# Patient Record
Sex: Female | Born: 1955 | Race: White | Hispanic: No | Marital: Single | Smoking: Never smoker
Health system: Southern US, Community
[De-identification: ages and names within clinical notes are randomized; demographics above are authoritative.]

## PROBLEM LIST (undated history)

## (undated) DIAGNOSIS — Z970 Presence of artificial eye: Secondary | ICD-10-CM

## (undated) DIAGNOSIS — F419 Anxiety disorder, unspecified: Secondary | ICD-10-CM

## (undated) DIAGNOSIS — I7300 Raynaud's syndrome without gangrene: Secondary | ICD-10-CM

## (undated) DIAGNOSIS — J189 Pneumonia, unspecified organism: Secondary | ICD-10-CM

## (undated) DIAGNOSIS — D649 Anemia, unspecified: Secondary | ICD-10-CM

## (undated) DIAGNOSIS — M810 Age-related osteoporosis without current pathological fracture: Secondary | ICD-10-CM

## (undated) DIAGNOSIS — M1990 Unspecified osteoarthritis, unspecified site: Secondary | ICD-10-CM

## (undated) DIAGNOSIS — F32A Depression, unspecified: Secondary | ICD-10-CM

## (undated) DIAGNOSIS — M67431 Ganglion, right wrist: Secondary | ICD-10-CM

## (undated) DIAGNOSIS — K759 Inflammatory liver disease, unspecified: Secondary | ICD-10-CM

## (undated) DIAGNOSIS — F909 Attention-deficit hyperactivity disorder, unspecified type: Secondary | ICD-10-CM

## (undated) DIAGNOSIS — K8020 Calculus of gallbladder without cholecystitis without obstruction: Secondary | ICD-10-CM

## (undated) DIAGNOSIS — R519 Headache, unspecified: Secondary | ICD-10-CM

## (undated) DIAGNOSIS — F329 Major depressive disorder, single episode, unspecified: Secondary | ICD-10-CM

## (undated) DIAGNOSIS — R51 Headache: Secondary | ICD-10-CM

## (undated) DIAGNOSIS — I10 Essential (primary) hypertension: Secondary | ICD-10-CM

## (undated) HISTORY — DX: Age-related osteoporosis without current pathological fracture: M81.0

## (undated) HISTORY — PX: ABDOMINAL HYSTERECTOMY: SHX81

## (undated) HISTORY — DX: Major depressive disorder, single episode, unspecified: F32.9

## (undated) HISTORY — PX: TONSILLECTOMY: SUR1361

## (undated) HISTORY — DX: Depression, unspecified: F32.A

---
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
## 1978-01-26 HISTORY — PX: RHINOPLASTY: SUR1284

## 1997-09-19 ENCOUNTER — Emergency Department (HOSPITAL_COMMUNITY): Admission: EM | Admit: 1997-09-19 | Discharge: 1997-09-19 | Payer: Self-pay | Admitting: Family Medicine

## 2002-02-06 ENCOUNTER — Encounter: Payer: Self-pay | Admitting: Internal Medicine

## 2002-02-06 ENCOUNTER — Encounter: Admission: RE | Admit: 2002-02-06 | Discharge: 2002-02-06 | Payer: Self-pay | Admitting: Internal Medicine

## 2002-09-07 ENCOUNTER — Encounter: Admission: RE | Admit: 2002-09-07 | Discharge: 2002-09-07 | Payer: Self-pay | Admitting: Internal Medicine

## 2002-09-07 ENCOUNTER — Encounter: Payer: Self-pay | Admitting: Internal Medicine

## 2003-03-27 ENCOUNTER — Encounter: Admission: RE | Admit: 2003-03-27 | Discharge: 2003-03-27 | Payer: Self-pay | Admitting: Internal Medicine

## 2003-04-02 ENCOUNTER — Encounter: Admission: RE | Admit: 2003-04-02 | Discharge: 2003-04-02 | Payer: Self-pay | Admitting: Internal Medicine

## 2003-12-04 ENCOUNTER — Ambulatory Visit: Payer: Self-pay | Admitting: Internal Medicine

## 2004-03-03 ENCOUNTER — Ambulatory Visit: Payer: Self-pay | Admitting: Gastroenterology

## 2004-05-08 ENCOUNTER — Ambulatory Visit: Payer: Self-pay | Admitting: Internal Medicine

## 2004-05-22 ENCOUNTER — Ambulatory Visit: Payer: Self-pay | Admitting: Internal Medicine

## 2004-06-12 ENCOUNTER — Ambulatory Visit: Payer: Self-pay | Admitting: Internal Medicine

## 2004-06-26 ENCOUNTER — Ambulatory Visit: Payer: Self-pay | Admitting: Internal Medicine

## 2004-07-10 ENCOUNTER — Ambulatory Visit: Payer: Self-pay | Admitting: Internal Medicine

## 2004-07-24 ENCOUNTER — Ambulatory Visit: Payer: Self-pay | Admitting: Internal Medicine

## 2004-08-04 ENCOUNTER — Ambulatory Visit (HOSPITAL_COMMUNITY): Admission: RE | Admit: 2004-08-04 | Discharge: 2004-08-04 | Payer: Self-pay | Admitting: Gastroenterology

## 2004-08-21 ENCOUNTER — Ambulatory Visit: Payer: Self-pay | Admitting: Gastroenterology

## 2004-09-18 ENCOUNTER — Ambulatory Visit: Payer: Self-pay | Admitting: Gastroenterology

## 2004-09-25 ENCOUNTER — Ambulatory Visit: Payer: Self-pay | Admitting: Gastroenterology

## 2004-10-09 ENCOUNTER — Ambulatory Visit: Payer: Self-pay | Admitting: Gastroenterology

## 2004-10-16 ENCOUNTER — Ambulatory Visit: Payer: Self-pay | Admitting: Gastroenterology

## 2004-10-23 ENCOUNTER — Ambulatory Visit: Payer: Self-pay | Admitting: Gastroenterology

## 2004-10-30 ENCOUNTER — Ambulatory Visit: Payer: Self-pay | Admitting: Gastroenterology

## 2004-11-06 ENCOUNTER — Ambulatory Visit: Payer: Self-pay | Admitting: Gastroenterology

## 2004-11-20 ENCOUNTER — Ambulatory Visit: Payer: Self-pay | Admitting: Gastroenterology

## 2004-12-04 ENCOUNTER — Ambulatory Visit: Payer: Self-pay | Admitting: Gastroenterology

## 2005-01-01 ENCOUNTER — Ambulatory Visit: Payer: Self-pay | Admitting: Gastroenterology

## 2005-01-08 ENCOUNTER — Ambulatory Visit: Payer: Self-pay | Admitting: Gastroenterology

## 2005-01-15 ENCOUNTER — Ambulatory Visit: Payer: Self-pay | Admitting: Gastroenterology

## 2005-01-26 DIAGNOSIS — A4902 Methicillin resistant Staphylococcus aureus infection, unspecified site: Secondary | ICD-10-CM

## 2005-01-26 DIAGNOSIS — J80 Acute respiratory distress syndrome: Secondary | ICD-10-CM

## 2005-01-26 HISTORY — PX: CORNEAL TRANSPLANT: SHX108

## 2005-01-26 HISTORY — DX: Acute respiratory distress syndrome: J80

## 2005-01-26 HISTORY — DX: Methicillin resistant Staphylococcus aureus infection, unspecified site: A49.02

## 2005-01-29 ENCOUNTER — Ambulatory Visit: Payer: Self-pay | Admitting: Gastroenterology

## 2005-02-05 ENCOUNTER — Ambulatory Visit: Payer: Self-pay | Admitting: Gastroenterology

## 2005-02-19 ENCOUNTER — Ambulatory Visit: Payer: Self-pay | Admitting: Gastroenterology

## 2005-03-12 ENCOUNTER — Ambulatory Visit: Payer: Self-pay | Admitting: Gastroenterology

## 2005-03-18 ENCOUNTER — Encounter: Admission: RE | Admit: 2005-03-18 | Discharge: 2005-03-18 | Payer: Self-pay | Admitting: Internal Medicine

## 2005-04-23 ENCOUNTER — Ambulatory Visit: Payer: Self-pay | Admitting: Gastroenterology

## 2005-07-16 ENCOUNTER — Ambulatory Visit: Payer: Self-pay | Admitting: Gastroenterology

## 2005-08-05 ENCOUNTER — Ambulatory Visit (HOSPITAL_COMMUNITY): Admission: RE | Admit: 2005-08-05 | Discharge: 2005-08-05 | Payer: Self-pay | Admitting: Gastroenterology

## 2005-09-03 ENCOUNTER — Encounter: Admission: RE | Admit: 2005-09-03 | Discharge: 2005-09-03 | Payer: Self-pay | Admitting: Internal Medicine

## 2007-01-27 DIAGNOSIS — J189 Pneumonia, unspecified organism: Secondary | ICD-10-CM

## 2007-01-27 HISTORY — DX: Pneumonia, unspecified organism: J18.9

## 2007-08-28 ENCOUNTER — Emergency Department (HOSPITAL_COMMUNITY): Admission: EM | Admit: 2007-08-28 | Discharge: 2007-08-28 | Payer: Self-pay | Admitting: Emergency Medicine

## 2008-04-09 ENCOUNTER — Ambulatory Visit (HOSPITAL_COMMUNITY): Admission: RE | Admit: 2008-04-09 | Discharge: 2008-04-09 | Payer: Self-pay | Admitting: Internal Medicine

## 2008-04-12 ENCOUNTER — Ambulatory Visit: Payer: Self-pay | Admitting: Family Medicine

## 2008-04-12 DIAGNOSIS — J309 Allergic rhinitis, unspecified: Secondary | ICD-10-CM | POA: Insufficient documentation

## 2008-04-12 DIAGNOSIS — L0201 Cutaneous abscess of face: Secondary | ICD-10-CM | POA: Insufficient documentation

## 2008-04-12 DIAGNOSIS — L03211 Cellulitis of face: Secondary | ICD-10-CM

## 2008-06-24 ENCOUNTER — Emergency Department (HOSPITAL_COMMUNITY): Admission: EM | Admit: 2008-06-24 | Discharge: 2008-06-24 | Payer: Self-pay | Admitting: Family Medicine

## 2008-06-24 ENCOUNTER — Ambulatory Visit: Payer: Self-pay | Admitting: Cardiology

## 2008-06-24 ENCOUNTER — Inpatient Hospital Stay (HOSPITAL_COMMUNITY): Admission: EM | Admit: 2008-06-24 | Discharge: 2008-07-07 | Payer: Self-pay | Admitting: Emergency Medicine

## 2008-06-24 ENCOUNTER — Ambulatory Visit: Payer: Self-pay | Admitting: Pulmonary Disease

## 2008-06-26 ENCOUNTER — Encounter (INDEPENDENT_AMBULATORY_CARE_PROVIDER_SITE_OTHER): Payer: Self-pay | Admitting: Pulmonary Disease

## 2008-07-05 ENCOUNTER — Ambulatory Visit: Payer: Self-pay | Admitting: Infectious Disease

## 2008-07-17 ENCOUNTER — Encounter: Admission: RE | Admit: 2008-07-17 | Discharge: 2008-07-17 | Payer: Self-pay | Admitting: Internal Medicine

## 2008-08-22 ENCOUNTER — Encounter: Payer: Self-pay | Admitting: Internal Medicine

## 2008-08-22 ENCOUNTER — Encounter: Admission: RE | Admit: 2008-08-22 | Discharge: 2008-08-22 | Payer: Self-pay | Admitting: Internal Medicine

## 2008-09-24 ENCOUNTER — Ambulatory Visit: Payer: Self-pay | Admitting: Internal Medicine

## 2008-09-27 DIAGNOSIS — R29818 Other symptoms and signs involving the nervous system: Secondary | ICD-10-CM | POA: Insufficient documentation

## 2008-10-01 DIAGNOSIS — J1520 Pneumonia due to staphylococcus, unspecified: Secondary | ICD-10-CM | POA: Insufficient documentation

## 2009-11-14 ENCOUNTER — Emergency Department (HOSPITAL_BASED_OUTPATIENT_CLINIC_OR_DEPARTMENT_OTHER): Admission: EM | Admit: 2009-11-14 | Discharge: 2009-11-14 | Payer: Self-pay | Admitting: Emergency Medicine

## 2009-11-14 ENCOUNTER — Ambulatory Visit: Payer: Self-pay | Admitting: Radiology

## 2009-11-16 ENCOUNTER — Emergency Department (HOSPITAL_BASED_OUTPATIENT_CLINIC_OR_DEPARTMENT_OTHER): Admission: EM | Admit: 2009-11-16 | Discharge: 2009-11-16 | Payer: Self-pay | Admitting: Emergency Medicine

## 2009-11-16 ENCOUNTER — Ambulatory Visit: Payer: Self-pay | Admitting: Diagnostic Radiology

## 2010-02-16 ENCOUNTER — Encounter: Payer: Self-pay | Admitting: Internal Medicine

## 2010-02-18 ENCOUNTER — Encounter
Admission: RE | Admit: 2010-02-18 | Discharge: 2010-02-25 | Payer: Self-pay | Source: Home / Self Care | Attending: Neurosurgery | Admitting: Neurosurgery

## 2010-02-19 ENCOUNTER — Ambulatory Visit (HOSPITAL_COMMUNITY)
Admission: RE | Admit: 2010-02-19 | Discharge: 2010-02-19 | Payer: Self-pay | Source: Home / Self Care | Attending: Neurosurgery | Admitting: Neurosurgery

## 2010-02-27 NOTE — Assessment & Plan Note (Signed)
Summary: Abcess/bump under/in nose x 1 wk rm 2    Vital Signs:  Patient Profile:   55 Years Old Female CC:      bump/abcess under nose or in nose x 1 wk Height:     62 inches Weight:      117 pounds O2 Sat:      100 % O2 treatment:    Room Air Temp:     97.1 degrees F oral Pulse rate:   57 / minute Pulse rhythm:   regular Resp:     16 per minute BP sitting:   114 / 65  (left arm) Cuff size:   regular  Pt. in pain?   yes    Location:   head    Intensity:   8  Vitals Entered By: Areta Haber CMA (April 12, 2008 9:58 AM)                   Current Allergies: No known allergies  History of Present Illness Chief Complaint: bump/abcess under nose or in nose x 1 wk History of Present Illness: SMALL ABSCESS LEFT NARE AND ONE STARTING IN RIGHT NARE. PAINFUL. NO PRIOR HX. NO DISCHARGE. ONSET 2-3-DAYS.  Current Problems: CELLULITIS AND ABSCESS OF FACE (ICD-682.0) ALLERGIC RHINITIS (ICD-477.9)   Current Meds ALLEGRA 60 MG TABS (FEXOFENADINE HCL) 1 tab by mouth once daily SINGULAIR 10 MG TABS (MONTELUKAST SODIUM) 1 tab by mouth once daily VALTREX 1 GM TABS (VALACYCLOVIR HCL) 1tab po  three times a day SEPTRA DS 800-160 MG TABS (SULFAMETHOXAZOLE-TRIMETHOPRIM) 1 TAB by mouth BID * BACTROBAN CREAM APPLY TO NARES TID   REVIEW OF SYSTEMS Constitutional Symptoms      Denies fever, chills, night sweats, weight loss, weight gain, and fatigue.  Eyes       Denies change in vision, eye pain, eye discharge, glasses, contact lenses, and eye surgery. Ear/Nose/Throat/Mouth       Denies hearing loss/aids, change in hearing, ear pain, ear discharge, dizziness, frequent runny nose, frequent nose bleeds, sinus problems, sore throat, hoarseness, and tooth pain or bleeding.      Comments: abcess.bump under nose Respiratory       Denies dry cough, productive cough, wheezing, shortness of breath, asthma, bronchitis, and emphysema/COPD.  Cardiovascular       Denies murmurs, chest pain, and  tires easily with exhertion.    Gastrointestinal       Denies stomach pain, nausea/vomiting, diarrhea, constipation, blood in bowel movements, and indigestion. Genitourniary       Denies painful urination, kidney stones, and loss of urinary control. Neurological       Denies paralysis, seizures, and fainting/blackouts. Musculoskeletal       Denies muscle pain, joint pain, joint stiffness, decreased range of motion, redness, swelling, muscle weakness, and gout.  Skin       Denies bruising, unusual mles/lumps or sores, and hair/skin or nail changes.  Psych       Denies mood changes, temper/anger issues, anxiety/stress, speech problems, depression, and sleep problems.  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 PAINFUL AND INDURATED, FOCAL SWELLING LATERAL WALL OF THE RIGHT NARE    Assessment New Problems: CELLULITIS AND ABSCESS OF FACE (ICD-682.0) ALLERGIC RHINITIS (ICD-477.9)   Plan New Medications/Changes: BACTROBAN CREAM APPLY TO NARES TID  #15 GM x 0, 04/12/2008, Nickoles Gregori DO SEPTRA DS 800-160 MG TABS (SULFAMETHOXAZOLE-TRIMETHOPRIM) 1 TAB by mouth BID  #20 x 0,  04/12/2008, Marvis Moeller DO  New Orders: New Patient Level II [99202]     Prescriptions: BACTROBAN CREAM APPLY TO NARES TID  #15 GM x 0   Entered and Authorized by:   Marvis Moeller DO   Signed by:   Marvis Moeller DO on 04/12/2008   Method used:   Print then Give to Patient   RxID:   [id number] SEPTRA DS 800-160 MG TABS (SULFAMETHOXAZOLE-TRIMETHOPRIM) 1 TAB by mouth BID  #20 x 0   Entered and Authorized by:   Marvis Moeller DO   Signed by:   Marvis Moeller DO on 04/12/2008   Method used:   Print then Give to Patient   RxID:   [id number]    Patient Instructions: 1)  APPLY HEAT THREE TIMES DAILY. FOLLOW UP WITH ENT IF IN NEED OF I & D. 2)  The medication list was reviewed and reconciled.  All changed / newly prescribed medications were explained.  A complete medication list was provided to the patient / caregiver.  ] ]

## 2010-02-27 NOTE — Letter (Signed)
Summary: Out of Work  [facility]  [address]   [place], [place] [address]   Phone: [phone]  Fax: [phone]    April 12, 2008   Employee:  JODENE POLYAK    To Whom It May Concern:   For Medical reasons, please excuse the above named employee from work for the following dates:  Start:   18 MARCH2010  End:   14 April 2008  If you need additional information, please feel free to contact our office.         Sincerely,    Marvis Moeller DO

## 2010-02-28 ENCOUNTER — Ambulatory Visit: Payer: Worker's Compensation | Attending: Neurosurgery | Admitting: Physical Therapy

## 2010-02-28 DIAGNOSIS — M2569 Stiffness of other specified joint, not elsewhere classified: Secondary | ICD-10-CM | POA: Insufficient documentation

## 2010-02-28 DIAGNOSIS — IMO0001 Reserved for inherently not codable concepts without codable children: Secondary | ICD-10-CM | POA: Insufficient documentation

## 2010-02-28 DIAGNOSIS — M545 Low back pain, unspecified: Secondary | ICD-10-CM | POA: Insufficient documentation

## 2010-02-28 DIAGNOSIS — R269 Unspecified abnormalities of gait and mobility: Secondary | ICD-10-CM | POA: Insufficient documentation

## 2010-03-03 ENCOUNTER — Ambulatory Visit: Payer: Worker's Compensation | Admitting: Physical Therapy

## 2010-03-06 ENCOUNTER — Ambulatory Visit: Payer: Worker's Compensation | Admitting: Physical Therapy

## 2010-04-07 ENCOUNTER — Other Ambulatory Visit (HOSPITAL_COMMUNITY): Payer: Self-pay | Admitting: Physical Medicine and Rehabilitation

## 2010-04-07 DIAGNOSIS — Q782 Osteopetrosis: Secondary | ICD-10-CM

## 2010-04-09 ENCOUNTER — Ambulatory Visit (HOSPITAL_COMMUNITY)
Admission: RE | Admit: 2010-04-09 | Discharge: 2010-04-09 | Disposition: A | Discharge status: Home / Self Care | Payer: PRIVATE HEALTH INSURANCE | Source: Ambulatory Visit | Attending: Physical Medicine and Rehabilitation | Admitting: Physical Medicine and Rehabilitation

## 2010-04-09 DIAGNOSIS — Q782 Osteopetrosis: Secondary | ICD-10-CM

## 2010-04-09 DIAGNOSIS — Z780 Asymptomatic menopausal state: Secondary | ICD-10-CM | POA: Insufficient documentation

## 2010-04-09 DIAGNOSIS — Z13820 Encounter for screening for osteoporosis: Secondary | ICD-10-CM | POA: Insufficient documentation

## 2010-05-05 LAB — BASIC METABOLIC PANEL
BUN: 11 mg/dL (ref 6–23)
BUN: 13 mg/dL (ref 6–23)
BUN: 14 mg/dL (ref 6–23)
BUN: 15 mg/dL (ref 6–23)
BUN: 7 mg/dL (ref 6–23)
BUN: 8 mg/dL (ref 6–23)
BUN: 9 mg/dL (ref 6–23)
CO2: 24 mEq/L (ref 19–32)
CO2: 25 mEq/L (ref 19–32)
CO2: 26 mEq/L (ref 19–32)
CO2: 26 mEq/L (ref 19–32)
CO2: 31 mEq/L (ref 19–32)
CO2: 33 mEq/L — ABNORMAL HIGH (ref 19–32)
CO2: 33 mEq/L — ABNORMAL HIGH (ref 19–32)
Calcium: 7.4 mg/dL — ABNORMAL LOW (ref 8.4–10.5)
Calcium: 7.5 mg/dL — ABNORMAL LOW (ref 8.4–10.5)
Calcium: 7.8 mg/dL — ABNORMAL LOW (ref 8.4–10.5)
Calcium: 7.9 mg/dL — ABNORMAL LOW (ref 8.4–10.5)
Calcium: 8.1 mg/dL — ABNORMAL LOW (ref 8.4–10.5)
Calcium: 8.1 mg/dL — ABNORMAL LOW (ref 8.4–10.5)
Calcium: 8.7 mg/dL (ref 8.4–10.5)
Chloride: 101 mEq/L (ref 96–112)
Chloride: 101 mEq/L (ref 96–112)
Chloride: 103 mEq/L (ref 96–112)
Chloride: 105 mEq/L (ref 96–112)
Chloride: 105 mEq/L (ref 96–112)
Chloride: 105 mEq/L (ref 96–112)
Chloride: 110 mEq/L (ref 96–112)
Creatinine, Ser: 0.42 mg/dL (ref 0.4–1.2)
Creatinine, Ser: 0.49 mg/dL (ref 0.4–1.2)
Creatinine, Ser: 0.5 mg/dL (ref 0.4–1.2)
Creatinine, Ser: 0.5 mg/dL (ref 0.4–1.2)
Creatinine, Ser: 0.54 mg/dL (ref 0.4–1.2)
Creatinine, Ser: 0.6 mg/dL (ref 0.4–1.2)
Creatinine, Ser: 0.6 mg/dL (ref 0.4–1.2)
GFR calc Af Amer: >60 mL/min (ref >60)
GFR calc Af Amer: >60 mL/min (ref >60)
GFR calc Af Amer: >60 mL/min (ref >60)
GFR calc Af Amer: >60 mL/min (ref >60)
GFR calc Af Amer: >60 mL/min (ref >60)
GFR calc Af Amer: >60 mL/min (ref >60)
GFR calc Af Amer: >60 mL/min (ref >60)
GFR calc non Af Amer: >60 mL/min (ref >60)
GFR calc non Af Amer: >60 mL/min (ref >60)
GFR calc non Af Amer: >60 mL/min (ref >60)
GFR calc non Af Amer: >60 mL/min (ref >60)
GFR calc non Af Amer: >60 mL/min (ref >60)
GFR calc non Af Amer: >60 mL/min (ref >60)
GFR calc non Af Amer: >60 mL/min (ref >60)
Glucose, Bld: 101 mg/dL — ABNORMAL HIGH (ref 70–99)
Glucose, Bld: 105 mg/dL — ABNORMAL HIGH (ref 70–99)
Glucose, Bld: 134 mg/dL — ABNORMAL HIGH (ref 70–99)
Glucose, Bld: 162 mg/dL — ABNORMAL HIGH (ref 70–99)
Glucose, Bld: 69 mg/dL — ABNORMAL LOW (ref 70–99)
Glucose, Bld: 72 mg/dL (ref 70–99)
Glucose, Bld: 91 mg/dL (ref 70–99)
Potassium: 2.5 mEq/L — CL (ref 3.5–5.1)
Potassium: 2.7 mEq/L — CL (ref 3.5–5.1)
Potassium: 3.3 mEq/L — ABNORMAL LOW (ref 3.5–5.1)
Potassium: 3.4 mEq/L — ABNORMAL LOW (ref 3.5–5.1)
Potassium: 3.7 mEq/L (ref 3.5–5.1)
Potassium: 3.7 mEq/L (ref 3.5–5.1)
Potassium: 4.1 mEq/L (ref 3.5–5.1)
Sodium: 135 mEq/L (ref 135–145)
Sodium: 136 mEq/L (ref 135–145)
Sodium: 137 mEq/L (ref 135–145)
Sodium: 139 mEq/L (ref 135–145)
Sodium: 143 mEq/L (ref 135–145)
Sodium: 146 mEq/L — ABNORMAL HIGH (ref 135–145)
Sodium: 148 mEq/L — ABNORMAL HIGH (ref 135–145)

## 2010-05-05 LAB — CBC
HCT: 27.1 % — ABNORMAL LOW (ref 36.0–46.0)
HCT: 27.5 % — ABNORMAL LOW (ref 36.0–46.0)
HCT: 28.5 % — ABNORMAL LOW (ref 36.0–46.0)
HCT: 28.7 % — ABNORMAL LOW (ref 36.0–46.0)
HCT: 29 % — ABNORMAL LOW (ref 36.0–46.0)
HCT: 29.8 % — ABNORMAL LOW (ref 36.0–46.0)
HCT: 29.8 % — ABNORMAL LOW (ref 36.0–46.0)
HCT: 29.8 % — ABNORMAL LOW (ref 36.0–46.0)
HCT: 30 % — ABNORMAL LOW (ref 36.0–46.0)
HCT: 31.1 % — ABNORMAL LOW (ref 36.0–46.0)
HCT: 31.1 % — ABNORMAL LOW (ref 36.0–46.0)
HCT: 32.2 % — ABNORMAL LOW (ref 36.0–46.0)
Hemoglobin: 10.1 g/dL — ABNORMAL LOW (ref 12.0–15.0)
Hemoglobin: 10.2 g/dL — ABNORMAL LOW (ref 12.0–15.0)
Hemoglobin: 10.3 g/dL — ABNORMAL LOW (ref 12.0–15.0)
Hemoglobin: 10.3 g/dL — ABNORMAL LOW (ref 12.0–15.0)
Hemoglobin: 10.5 g/dL — ABNORMAL LOW (ref 12.0–15.0)
Hemoglobin: 10.5 g/dL — ABNORMAL LOW (ref 12.0–15.0)
Hemoglobin: 10.9 g/dL — ABNORMAL LOW (ref 12.0–15.0)
Hemoglobin: 9.3 g/dL — ABNORMAL LOW (ref 12.0–15.0)
Hemoglobin: 9.4 g/dL — ABNORMAL LOW (ref 12.0–15.0)
Hemoglobin: 9.7 g/dL — ABNORMAL LOW (ref 12.0–15.0)
Hemoglobin: 9.8 g/dL — ABNORMAL LOW (ref 12.0–15.0)
Hemoglobin: 9.9 g/dL — ABNORMAL LOW (ref 12.0–15.0)
MCHC: 33.7 g/dL (ref 30.0–36.0)
MCHC: 33.7 g/dL (ref 30.0–36.0)
MCHC: 33.8 g/dL (ref 30.0–36.0)
MCHC: 33.8 g/dL (ref 30.0–36.0)
MCHC: 33.9 g/dL (ref 30.0–36.0)
MCHC: 33.9 g/dL (ref 30.0–36.0)
MCHC: 34 g/dL (ref 30.0–36.0)
MCHC: 34.2 g/dL (ref 30.0–36.0)
MCHC: 34.3 g/dL (ref 30.0–36.0)
MCHC: 34.3 g/dL (ref 30.0–36.0)
MCHC: 34.4 g/dL (ref 30.0–36.0)
MCHC: 34.5 g/dL (ref 30.0–36.0)
MCV: 100.1 fL — ABNORMAL HIGH (ref 78.0–100.0)
MCV: 100.2 fL — ABNORMAL HIGH (ref 78.0–100.0)
MCV: 100.4 fL — ABNORMAL HIGH (ref 78.0–100.0)
MCV: 100.4 fL — ABNORMAL HIGH (ref 78.0–100.0)
MCV: 100.5 fL — ABNORMAL HIGH (ref 78.0–100.0)
MCV: 100.5 fL — ABNORMAL HIGH (ref 78.0–100.0)
MCV: 100.7 fL — ABNORMAL HIGH (ref 78.0–100.0)
MCV: 99 fL (ref 78.0–100.0)
MCV: 99.1 fL (ref 78.0–100.0)
MCV: 99.2 fL (ref 78.0–100.0)
MCV: 99.6 fL (ref 78.0–100.0)
MCV: 99.8 fL (ref 78.0–100.0)
Platelets: 104 10*3/uL — ABNORMAL LOW (ref 150–400)
Platelets: 119 10*3/uL — ABNORMAL LOW (ref 150–400)
Platelets: 127 10*3/uL — ABNORMAL LOW (ref 150–400)
Platelets: 143 10*3/uL — ABNORMAL LOW (ref 150–400)
Platelets: 180 10*3/uL (ref 150–400)
Platelets: 216 10*3/uL (ref 150–400)
Platelets: 241 10*3/uL (ref 150–400)
Platelets: 250 10*3/uL (ref 150–400)
Platelets: 300 10*3/uL (ref 150–400)
Platelets: 316 10*3/uL (ref 150–400)
Platelets: 347 10*3/uL (ref 150–400)
Platelets: 353 10*3/uL (ref 150–400)
RBC: 2.72 MIL/uL — ABNORMAL LOW (ref 3.87–5.11)
RBC: 2.75 MIL/uL — ABNORMAL LOW (ref 3.87–5.11)
RBC: 2.83 MIL/uL — ABNORMAL LOW (ref 3.87–5.11)
RBC: 2.87 MIL/uL — ABNORMAL LOW (ref 3.87–5.11)
RBC: 2.89 MIL/uL — ABNORMAL LOW (ref 3.87–5.11)
RBC: 2.96 MIL/uL — ABNORMAL LOW (ref 3.87–5.11)
RBC: 2.96 MIL/uL — ABNORMAL LOW (ref 3.87–5.11)
RBC: 2.99 MIL/uL — ABNORMAL LOW (ref 3.87–5.11)
RBC: 3.02 MIL/uL — ABNORMAL LOW (ref 3.87–5.11)
RBC: 3.13 MIL/uL — ABNORMAL LOW (ref 3.87–5.11)
RBC: 3.14 MIL/uL — ABNORMAL LOW (ref 3.87–5.11)
RBC: 3.21 MIL/uL — ABNORMAL LOW (ref 3.87–5.11)
RDW: 13.6 % (ref 11.5–15.5)
RDW: 13.6 % (ref 11.5–15.5)
RDW: 13.7 % (ref 11.5–15.5)
RDW: 13.7 % (ref 11.5–15.5)
RDW: 13.7 % (ref 11.5–15.5)
RDW: 13.8 % (ref 11.5–15.5)
RDW: 13.9 % (ref 11.5–15.5)
RDW: 14 % (ref 11.5–15.5)
RDW: 14 % (ref 11.5–15.5)
RDW: 14 % (ref 11.5–15.5)
RDW: 14.1 % (ref 11.5–15.5)
RDW: 14.2 % (ref 11.5–15.5)
WBC: 11.7 10*3/uL — ABNORMAL HIGH (ref 4.0–10.5)
WBC: 11.8 10*3/uL — ABNORMAL HIGH (ref 4.0–10.5)
WBC: 12.5 10*3/uL — ABNORMAL HIGH (ref 4.0–10.5)
WBC: 15.9 10*3/uL — ABNORMAL HIGH (ref 4.0–10.5)
WBC: 16.5 10*3/uL — ABNORMAL HIGH (ref 4.0–10.5)
WBC: 16.6 10*3/uL — ABNORMAL HIGH (ref 4.0–10.5)
WBC: 17.3 10*3/uL — ABNORMAL HIGH (ref 4.0–10.5)
WBC: 19.6 10*3/uL — ABNORMAL HIGH (ref 4.0–10.5)
WBC: 20.1 10*3/uL — ABNORMAL HIGH (ref 4.0–10.5)
WBC: 20.7 10*3/uL — ABNORMAL HIGH (ref 4.0–10.5)
WBC: 22 10*3/uL — ABNORMAL HIGH (ref 4.0–10.5)
WBC: 27.7 10*3/uL — ABNORMAL HIGH (ref 4.0–10.5)

## 2010-05-05 LAB — COMPREHENSIVE METABOLIC PANEL
ALT: 11 U/L (ref 0–35)
ALT: 13 U/L (ref 0–35)
ALT: 14 U/L (ref 0–35)
ALT: 15 U/L (ref 0–35)
AST: 20 U/L (ref 0–37)
AST: 22 U/L (ref 0–37)
AST: 37 U/L (ref 0–37)
AST: 47 U/L — ABNORMAL HIGH (ref 0–37)
Albumin: 1.9 g/dL — ABNORMAL LOW (ref 3.5–5.2)
Albumin: 2.1 g/dL — ABNORMAL LOW (ref 3.5–5.2)
Albumin: 2.1 g/dL — ABNORMAL LOW (ref 3.5–5.2)
Albumin: 2.3 g/dL — ABNORMAL LOW (ref 3.5–5.2)
Alkaline Phosphatase: 39 U/L (ref 39–117)
Alkaline Phosphatase: 54 U/L (ref 39–117)
Alkaline Phosphatase: 57 U/L (ref 39–117)
Alkaline Phosphatase: 66 U/L (ref 39–117)
BUN: 11 mg/dL (ref 6–23)
BUN: 5 mg/dL — ABNORMAL LOW (ref 6–23)
BUN: 5 mg/dL — ABNORMAL LOW (ref 6–23)
BUN: 7 mg/dL (ref 6–23)
CO2: 20 mEq/L (ref 19–32)
CO2: 27 mEq/L (ref 19–32)
CO2: 27 mEq/L (ref 19–32)
CO2: 29 mEq/L (ref 19–32)
Calcium: 6.9 mg/dL — ABNORMAL LOW (ref 8.4–10.5)
Calcium: 7.6 mg/dL — ABNORMAL LOW (ref 8.4–10.5)
Calcium: 8.2 mg/dL — ABNORMAL LOW (ref 8.4–10.5)
Calcium: 8.4 mg/dL (ref 8.4–10.5)
Chloride: 100 mEq/L (ref 96–112)
Chloride: 104 mEq/L (ref 96–112)
Chloride: 107 mEq/L (ref 96–112)
Chloride: 111 mEq/L (ref 96–112)
Creatinine, Ser: 0.5 mg/dL (ref 0.4–1.2)
Creatinine, Ser: 0.57 mg/dL (ref 0.4–1.2)
Creatinine, Ser: 0.59 mg/dL (ref 0.4–1.2)
Creatinine, Ser: 0.61 mg/dL (ref 0.4–1.2)
GFR calc Af Amer: >60 mL/min (ref >60)
GFR calc Af Amer: >60 mL/min (ref >60)
GFR calc Af Amer: >60 mL/min (ref >60)
GFR calc Af Amer: >60 mL/min (ref >60)
GFR calc non Af Amer: >60 mL/min (ref >60)
GFR calc non Af Amer: >60 mL/min (ref >60)
GFR calc non Af Amer: >60 mL/min (ref >60)
GFR calc non Af Amer: >60 mL/min (ref >60)
Glucose, Bld: 114 mg/dL — ABNORMAL HIGH (ref 70–99)
Glucose, Bld: 130 mg/dL — ABNORMAL HIGH (ref 70–99)
Glucose, Bld: 80 mg/dL (ref 70–99)
Glucose, Bld: 92 mg/dL (ref 70–99)
Potassium: 2.8 mEq/L — ABNORMAL LOW (ref 3.5–5.1)
Potassium: 3.1 mEq/L — ABNORMAL LOW (ref 3.5–5.1)
Potassium: 3.7 mEq/L (ref 3.5–5.1)
Potassium: 4.5 mEq/L (ref 3.5–5.1)
Sodium: 134 mEq/L — ABNORMAL LOW (ref 135–145)
Sodium: 136 mEq/L (ref 135–145)
Sodium: 137 mEq/L (ref 135–145)
Sodium: 143 mEq/L (ref 135–145)
Total Bilirubin: 0.5 mg/dL (ref 0.3–1.2)
Total Bilirubin: 0.6 mg/dL (ref 0.3–1.2)
Total Bilirubin: 0.8 mg/dL (ref 0.3–1.2)
Total Bilirubin: 0.8 mg/dL (ref 0.3–1.2)
Total Protein: 5.1 g/dL — ABNORMAL LOW (ref 6.0–8.3)
Total Protein: 5.5 g/dL — ABNORMAL LOW (ref 6.0–8.3)
Total Protein: 6.7 g/dL (ref 6.0–8.3)
Total Protein: 7.4 g/dL (ref 6.0–8.3)

## 2010-05-05 LAB — GLUCOSE, CAPILLARY
Glucose-Capillary: 100 mg/dL — ABNORMAL HIGH (ref 70–99)
Glucose-Capillary: 103 mg/dL — ABNORMAL HIGH (ref 70–99)
Glucose-Capillary: 105 mg/dL — ABNORMAL HIGH (ref 70–99)
Glucose-Capillary: 106 mg/dL — ABNORMAL HIGH (ref 70–99)
Glucose-Capillary: 107 mg/dL — ABNORMAL HIGH (ref 70–99)
Glucose-Capillary: 108 mg/dL — ABNORMAL HIGH (ref 70–99)
Glucose-Capillary: 110 mg/dL — ABNORMAL HIGH (ref 70–99)
Glucose-Capillary: 113 mg/dL — ABNORMAL HIGH (ref 70–99)
Glucose-Capillary: 114 mg/dL — ABNORMAL HIGH (ref 70–99)
Glucose-Capillary: 118 mg/dL — ABNORMAL HIGH (ref 70–99)
Glucose-Capillary: 118 mg/dL — ABNORMAL HIGH (ref 70–99)
Glucose-Capillary: 118 mg/dL — ABNORMAL HIGH (ref 70–99)
Glucose-Capillary: 121 mg/dL — ABNORMAL HIGH (ref 70–99)
Glucose-Capillary: 121 mg/dL — ABNORMAL HIGH (ref 70–99)
Glucose-Capillary: 122 mg/dL — ABNORMAL HIGH (ref 70–99)
Glucose-Capillary: 122 mg/dL — ABNORMAL HIGH (ref 70–99)
Glucose-Capillary: 125 mg/dL — ABNORMAL HIGH (ref 70–99)
Glucose-Capillary: 125 mg/dL — ABNORMAL HIGH (ref 70–99)
Glucose-Capillary: 125 mg/dL — ABNORMAL HIGH (ref 70–99)
Glucose-Capillary: 128 mg/dL — ABNORMAL HIGH (ref 70–99)
Glucose-Capillary: 129 mg/dL — ABNORMAL HIGH (ref 70–99)
Glucose-Capillary: 133 mg/dL — ABNORMAL HIGH (ref 70–99)
Glucose-Capillary: 135 mg/dL — ABNORMAL HIGH (ref 70–99)
Glucose-Capillary: 146 mg/dL — ABNORMAL HIGH (ref 70–99)
Glucose-Capillary: 156 mg/dL — ABNORMAL HIGH (ref 70–99)
Glucose-Capillary: 158 mg/dL — ABNORMAL HIGH (ref 70–99)
Glucose-Capillary: 70 mg/dL (ref 70–99)
Glucose-Capillary: 80 mg/dL (ref 70–99)
Glucose-Capillary: 86 mg/dL (ref 70–99)
Glucose-Capillary: 90 mg/dL (ref 70–99)
Glucose-Capillary: 91 mg/dL (ref 70–99)
Glucose-Capillary: 91 mg/dL (ref 70–99)
Glucose-Capillary: 92 mg/dL (ref 70–99)
Glucose-Capillary: 94 mg/dL (ref 70–99)

## 2010-05-05 LAB — POCT I-STAT 3, ART BLOOD GAS (G3+)
Acid-Base Excess: 4 mmol/L — ABNORMAL HIGH (ref 0.0–2.0)
Acid-Base Excess: 5 mmol/L — ABNORMAL HIGH (ref 0.0–2.0)
Acid-base deficit: 6 mmol/L — ABNORMAL HIGH (ref 0.0–2.0)
Acid-base deficit: 8 mmol/L — ABNORMAL HIGH (ref 0.0–2.0)
Bicarbonate: 18.1 mEq/L — ABNORMAL LOW (ref 20.0–24.0)
Bicarbonate: 18.8 mEq/L — ABNORMAL LOW (ref 20.0–24.0)
Bicarbonate: 27 mEq/L — ABNORMAL HIGH (ref 20.0–24.0)
Bicarbonate: 29.1 mEq/L — ABNORMAL HIGH (ref 20.0–24.0)
O2 Saturation: 87 %
O2 Saturation: 88 %
O2 Saturation: 90 %
O2 Saturation: 94 %
Patient temperature: 101.6
Patient temperature: 101.9
Patient temperature: 37
Patient temperature: 97.8
TCO2: 19 mmol/L (ref 0–100)
TCO2: 20 mmol/L (ref 0–100)
TCO2: 28 mmol/L (ref 0–100)
TCO2: 30 mmol/L (ref 0–100)
pCO2 arterial: 34.7 mmHg — ABNORMAL LOW (ref 35.0–45.0)
pCO2 arterial: 36.9 mmHg (ref 35.0–45.0)
pCO2 arterial: 37.8 mmHg (ref 35.0–45.0)
pCO2 arterial: 40.7 mmHg (ref 35.0–45.0)
pH, Arterial: 7.267 — ABNORMAL LOW (ref 7.350–7.400)
pH, Arterial: 7.313 — ABNORMAL LOW (ref 7.350–7.400)
pH, Arterial: 7.499 — ABNORMAL HIGH (ref 7.350–7.400)
pH, Arterial: 7.503 — ABNORMAL HIGH (ref 7.350–7.400)
pO2, Arterial: 47 mmHg — ABNORMAL LOW (ref 80.0–100.0)
pO2, Arterial: 49 mmHg — ABNORMAL LOW (ref 80.0–100.0)
pO2, Arterial: 74 mmHg — ABNORMAL LOW (ref 80.0–100.0)
pO2, Arterial: 82 mmHg (ref 80.0–100.0)

## 2010-05-05 LAB — EXPECTORATED SPUTUM ASSESSMENT W REFEX TO RESP CULTURE

## 2010-05-05 LAB — BLOOD GAS, ARTERIAL
Acid-Base Excess: 8.1 mmol/L — ABNORMAL HIGH (ref 0.0–2.0)
Bicarbonate: 31.4 mEq/L — ABNORMAL HIGH (ref 20.0–24.0)
FIO2: 1 %
O2 Saturation: 92.6 %
Patient temperature: 98.6
TCO2: 32.6 mmol/L (ref 0–100)
pCO2 arterial: 38.6 mmHg (ref 35.0–45.0)
pH, Arterial: 7.521 — ABNORMAL HIGH (ref 7.350–7.400)
pO2, Arterial: 60.3 mmHg — ABNORMAL LOW (ref 80.0–100.0)

## 2010-05-05 LAB — DIFFERENTIAL
Basophils Absolute: 0 10*3/uL (ref 0.0–0.1)
Basophils Relative: 0 % (ref 0–1)
Eosinophils Absolute: 0.4 10*3/uL (ref 0.0–0.7)
Eosinophils Relative: 2 % (ref 0–5)
Lymphocytes Relative: 6 % — ABNORMAL LOW (ref 12–46)
Lymphs Abs: 1.2 10*3/uL (ref 0.7–4.0)
Monocytes Absolute: 0.4 10*3/uL (ref 0.1–1.0)
Monocytes Relative: 2 % — ABNORMAL LOW (ref 3–12)
Neutro Abs: 18.1 10*3/uL — ABNORMAL HIGH (ref 1.7–7.7)
Neutrophils Relative %: 90 % — ABNORMAL HIGH (ref 43–77)

## 2010-05-05 LAB — PHOSPHORUS
Phosphorus: 1.1 mg/dL — ABNORMAL LOW (ref 2.3–4.6)
Phosphorus: 1.3 mg/dL — ABNORMAL LOW (ref 2.3–4.6)
Phosphorus: 3.4 mg/dL (ref 2.3–4.6)
Phosphorus: 3.8 mg/dL (ref 2.3–4.6)
Phosphorus: 4.3 mg/dL (ref 2.3–4.6)
Phosphorus: <1 mg/dL — CL (ref 2.3–4.6)

## 2010-05-05 LAB — AFB CULTURE WITH SMEAR (NOT AT ARMC)
Acid Fast Smear: NONE SEEN
Acid Fast Smear: NONE SEEN

## 2010-05-05 LAB — CULTURE, RESPIRATORY W GRAM STAIN

## 2010-05-05 LAB — PROTEIN, BODY FLUID: Total protein, fluid: 4.9 g/dL

## 2010-05-05 LAB — HIV ANTIBODY (ROUTINE TESTING W REFLEX): HIV: NONREACTIVE

## 2010-05-05 LAB — CULTURE, RESPIRATORY

## 2010-05-05 LAB — HEPATIC FUNCTION PANEL
ALT: 12 U/L (ref 0–35)
AST: 33 U/L (ref 0–37)
Albumin: 1.8 g/dL — ABNORMAL LOW (ref 3.5–5.2)
Alkaline Phosphatase: 49 U/L (ref 39–117)
Bilirubin, Direct: 0.3 mg/dL (ref 0.0–0.3)
Indirect Bilirubin: 0.3 mg/dL (ref 0.3–0.9)
Total Bilirubin: 0.6 mg/dL (ref 0.3–1.2)
Total Protein: 5.1 g/dL — ABNORMAL LOW (ref 6.0–8.3)

## 2010-05-05 LAB — LACTIC ACID, PLASMA: Lactic Acid, Venous: 2.5 mmol/L — ABNORMAL HIGH (ref 0.5–2.2)

## 2010-05-05 LAB — CLOSTRIDIUM DIFFICILE EIA
C difficile Toxins A+B, EIA: NEGATIVE
C difficile Toxins A+B, EIA: NEGATIVE

## 2010-05-05 LAB — HEPATITIS C GENOTYPE

## 2010-05-05 LAB — GLUCOSE, SEROUS FLUID: Glucose, Fluid: 134 mg/dL

## 2010-05-05 LAB — MAGNESIUM
Magnesium: 1.7 mg/dL (ref 1.5–2.5)
Magnesium: 1.8 mg/dL (ref 1.5–2.5)
Magnesium: 1.9 mg/dL (ref 1.5–2.5)
Magnesium: 2 mg/dL (ref 1.5–2.5)
Magnesium: 2 mg/dL (ref 1.5–2.5)
Magnesium: 2.3 mg/dL (ref 1.5–2.5)

## 2010-05-05 LAB — BODY FLUID CULTURE: Culture: NO GROWTH

## 2010-05-05 LAB — VANCOMYCIN, TROUGH: Vancomycin Tr: 17.2 ug/mL (ref 10.0–20.0)

## 2010-05-05 LAB — HCV RNA QUANT RFLX ULTRA OR GENOTYP
HCV Quantitative Log: 6.04 {Log} — ABNORMAL HIGH (ref <1.63)
HCV Quantitative: 1090000 IU/mL — ABNORMAL HIGH (ref <43)

## 2010-05-05 LAB — CARDIAC PANEL(CRET KIN+CKTOT+MB+TROPI)
CK, MB: 10.4 ng/mL — ABNORMAL HIGH (ref 0.3–4.0)
Relative Index: 3.2 — ABNORMAL HIGH (ref 0.0–2.5)
Total CK: 327 U/L — ABNORMAL HIGH (ref 7–177)
Troponin I: 1.1 ng/mL (ref 0.00–0.06)

## 2010-05-05 LAB — LACTATE DEHYDROGENASE, PLEURAL OR PERITONEAL FLUID: LD, Fluid: 316 U/L — ABNORMAL HIGH (ref 3–23)

## 2010-05-05 LAB — HEPATITIS PANEL, ACUTE
HCV Ab: REACTIVE — AB
Hep A IgM: NEGATIVE
Hep B C IgM: NEGATIVE
Hepatitis B Surface Ag: NEGATIVE

## 2010-05-05 LAB — EXPECTORATED SPUTUM ASSESSMENT W GRAM STAIN, RFLX TO RESP C

## 2010-05-05 LAB — HEPARIN LEVEL (UNFRACTIONATED): Heparin Unfractionated: 0.19 IU/mL — ABNORMAL LOW (ref 0.30–0.70)

## 2010-05-05 LAB — H1N1 SCREEN (PCR): H1N1 Virus Scrn: NOT DETECTED

## 2010-05-05 LAB — CORTISOL: Cortisol, Plasma: 18.1 ug/dL

## 2010-05-05 LAB — D-DIMER, QUANTITATIVE (NOT AT ARMC): D-Dimer, Quant: 3.71 ug/mL-FEU — ABNORMAL HIGH (ref 0.00–0.48)

## 2010-05-05 LAB — HEPATITIS C VRS RNA DETECT BY PCR-QUAL: Hepatitis C Vrs RNA by PCR-Qual: POSITIVE — AB

## 2010-05-06 LAB — RAPID URINE DRUG SCREEN, HOSP PERFORMED
Amphetamines: NEGATIVE — AB
Barbiturates: NEGATIVE — AB
Benzodiazepines: NEGATIVE — AB
Cocaine: NEGATIVE — AB
Opiates: NEGATIVE — AB
Tetrahydrocannabinol: NEGATIVE — AB

## 2010-05-06 LAB — POCT I-STAT 3, ART BLOOD GAS (G3+)
Acid-base deficit: 3 mmol/L — ABNORMAL HIGH (ref 0.0–2.0)
Acid-base deficit: 6 mmol/L — ABNORMAL HIGH (ref 0.0–2.0)
Acid-base deficit: 7 mmol/L — ABNORMAL HIGH (ref 0.0–2.0)
Acid-base deficit: 8 mmol/L — ABNORMAL HIGH (ref 0.0–2.0)
Acid-base deficit: 8 mmol/L — ABNORMAL HIGH (ref 0.0–2.0)
Bicarbonate: 17.8 mEq/L — ABNORMAL LOW (ref 20.0–24.0)
Bicarbonate: 18 mEq/L — ABNORMAL LOW (ref 20.0–24.0)
Bicarbonate: 18.5 mEq/L — ABNORMAL LOW (ref 20.0–24.0)
Bicarbonate: 18.9 mEq/L — ABNORMAL LOW (ref 20.0–24.0)
Bicarbonate: 20.9 mEq/L (ref 20.0–24.0)
O2 Saturation: 89 %
O2 Saturation: 92 %
O2 Saturation: 93 %
O2 Saturation: 93 %
O2 Saturation: 99 %
Patient temperature: 102.3
Patient temperature: 97.9
TCO2: 19 mmol/L (ref 0–100)
TCO2: 19 mmol/L (ref 0–100)
TCO2: 20 mmol/L (ref 0–100)
TCO2: 20 mmol/L (ref 0–100)
TCO2: 22 mmol/L (ref 0–100)
pCO2 arterial: 32.1 mmHg — ABNORMAL LOW (ref 35.0–45.0)
pCO2 arterial: 36 mmHg (ref 35.0–45.0)
pCO2 arterial: 37.1 mmHg (ref 35.0–45.0)
pCO2 arterial: 37.7 mmHg (ref 35.0–45.0)
pCO2 arterial: 38.1 mmHg (ref 35.0–45.0)
pH, Arterial: 7.294 — ABNORMAL LOW (ref 7.350–7.400)
pH, Arterial: 7.294 — ABNORMAL LOW (ref 7.350–7.400)
pH, Arterial: 7.302 — ABNORMAL LOW (ref 7.350–7.400)
pH, Arterial: 7.361 (ref 7.350–7.400)
pH, Arterial: 7.377 (ref 7.350–7.400)
pO2, Arterial: 132 mmHg — ABNORMAL HIGH (ref 80.0–100.0)
pO2, Arterial: 56 mmHg — ABNORMAL LOW (ref 80.0–100.0)
pO2, Arterial: 73 mmHg — ABNORMAL LOW (ref 80.0–100.0)
pO2, Arterial: 73 mmHg — ABNORMAL LOW (ref 80.0–100.0)
pO2, Arterial: 74 mmHg — ABNORMAL LOW (ref 80.0–100.0)

## 2010-05-06 LAB — COMPREHENSIVE METABOLIC PANEL
ALT: 11 U/L (ref 0–35)
AST: 36 U/L (ref 0–37)
Albumin: 2.2 g/dL — ABNORMAL LOW (ref 3.5–5.2)
Alkaline Phosphatase: 35 U/L — ABNORMAL LOW (ref 39–117)
BUN: 8 mg/dL (ref 6–23)
CO2: 22 mEq/L (ref 19–32)
Calcium: 6.9 mg/dL — ABNORMAL LOW (ref 8.4–10.5)
Chloride: 109 mEq/L (ref 96–112)
Creatinine, Ser: 0.67 mg/dL (ref 0.4–1.2)
GFR calc Af Amer: >60 mL/min (ref >60)
GFR calc non Af Amer: >60 mL/min (ref >60)
Glucose, Bld: 150 mg/dL — ABNORMAL HIGH (ref 70–99)
Potassium: 3 mEq/L — ABNORMAL LOW (ref 3.5–5.1)
Sodium: 141 mEq/L (ref 135–145)
Total Bilirubin: 0.8 mg/dL (ref 0.3–1.2)
Total Protein: 5.1 g/dL — ABNORMAL LOW (ref 6.0–8.3)

## 2010-05-06 LAB — CBC
HCT: 32.6 % — ABNORMAL LOW (ref 36.0–46.0)
HCT: 33.9 % — ABNORMAL LOW (ref 36.0–46.0)
HCT: 37 % (ref 36.0–46.0)
Hemoglobin: 11 g/dL — ABNORMAL LOW (ref 12.0–15.0)
Hemoglobin: 11.5 g/dL — ABNORMAL LOW (ref 12.0–15.0)
Hemoglobin: 12.7 g/dL (ref 12.0–15.0)
MCHC: 33.6 g/dL (ref 30.0–36.0)
MCHC: 33.9 g/dL (ref 30.0–36.0)
MCHC: 34.4 g/dL (ref 30.0–36.0)
MCV: 100.3 fL — ABNORMAL HIGH (ref 78.0–100.0)
MCV: 100.7 fL — ABNORMAL HIGH (ref 78.0–100.0)
MCV: 98.9 fL (ref 78.0–100.0)
Platelets: 113 10*3/uL — ABNORMAL LOW (ref 150–400)
Platelets: 124 10*3/uL — ABNORMAL LOW (ref 150–400)
Platelets: 125 10*3/uL — ABNORMAL LOW (ref 150–400)
RBC: 3.24 MIL/uL — ABNORMAL LOW (ref 3.87–5.11)
RBC: 3.38 MIL/uL — ABNORMAL LOW (ref 3.87–5.11)
RBC: 3.74 MIL/uL — ABNORMAL LOW (ref 3.87–5.11)
RDW: 13.1 % (ref 11.5–15.5)
RDW: 13.1 % (ref 11.5–15.5)
RDW: 13.6 % (ref 11.5–15.5)
WBC: 10.3 10*3/uL (ref 4.0–10.5)
WBC: 12.2 10*3/uL — ABNORMAL HIGH (ref 4.0–10.5)
WBC: 9.2 10*3/uL (ref 4.0–10.5)

## 2010-05-06 LAB — DIFFERENTIAL
Basophils Absolute: 0 10*3/uL (ref 0.0–0.1)
Basophils Absolute: 0 10*3/uL (ref 0.0–0.1)
Basophils Absolute: 0.1 10*3/uL (ref 0.0–0.1)
Basophils Relative: 0 % (ref 0–1)
Basophils Relative: 0 % (ref 0–1)
Basophils Relative: 1 % (ref 0–1)
Eosinophils Absolute: 0 10*3/uL (ref 0.0–0.7)
Eosinophils Absolute: 0 10*3/uL (ref 0.0–0.7)
Eosinophils Absolute: 0 10*3/uL (ref 0.0–0.7)
Eosinophils Relative: 0 % (ref 0–5)
Eosinophils Relative: 0 % (ref 0–5)
Eosinophils Relative: 0 % (ref 0–5)
Lymphocytes Relative: 11 % — ABNORMAL LOW (ref 12–46)
Lymphocytes Relative: 11 % — ABNORMAL LOW (ref 12–46)
Lymphocytes Relative: 9 % — ABNORMAL LOW (ref 12–46)
Lymphs Abs: 1 10*3/uL (ref 0.7–4.0)
Lymphs Abs: 1.1 10*3/uL (ref 0.7–4.0)
Lymphs Abs: 1.1 10*3/uL (ref 0.7–4.0)
Monocytes Absolute: 0.4 10*3/uL (ref 0.1–1.0)
Monocytes Absolute: 0.4 10*3/uL (ref 0.1–1.0)
Monocytes Absolute: 0.6 10*3/uL (ref 0.1–1.0)
Monocytes Relative: 3 % (ref 3–12)
Monocytes Relative: 4 % (ref 3–12)
Monocytes Relative: 7 % (ref 3–12)
Neutro Abs: 10.7 10*3/uL — ABNORMAL HIGH (ref 1.7–7.7)
Neutro Abs: 7.6 10*3/uL (ref 1.7–7.7)
Neutro Abs: 8.8 10*3/uL — ABNORMAL HIGH (ref 1.7–7.7)
Neutrophils Relative %: 82 % — ABNORMAL HIGH (ref 43–77)
Neutrophils Relative %: 85 % — ABNORMAL HIGH (ref 43–77)
Neutrophils Relative %: 88 % — ABNORMAL HIGH (ref 43–77)
WBC Morphology: INCREASED

## 2010-05-06 LAB — BRAIN NATRIURETIC PEPTIDE: Pro B Natriuretic peptide (BNP): 769 pg/mL — ABNORMAL HIGH (ref 0.0–100.0)

## 2010-05-06 LAB — CARBOXYHEMOGLOBIN
Carboxyhemoglobin: 0.7 % (ref 0.5–1.5)
Carboxyhemoglobin: 0.8 % (ref 0.5–1.5)
Methemoglobin: 0.1 % (ref 0.0–1.5)
Methemoglobin: 0.7 % (ref 0.0–1.5)
O2 Saturation: 69.4 %
O2 Saturation: 79.8 %
Total hemoglobin: 10.9 g/dL — ABNORMAL LOW (ref 12.5–16.0)
Total hemoglobin: 11.4 g/dL — ABNORMAL LOW (ref 12.5–16.0)

## 2010-05-06 LAB — CULTURE, BLOOD (ROUTINE X 2)
Culture: NO GROWTH
Culture: NO GROWTH
Culture: NO GROWTH
Culture: NO GROWTH

## 2010-05-06 LAB — D-DIMER, QUANTITATIVE (NOT AT ARMC): D-Dimer, Quant: 2.19 ug/mL-FEU — ABNORMAL HIGH (ref 0.00–0.48)

## 2010-05-06 LAB — URINALYSIS, MICROSCOPIC ONLY
Bilirubin Urine: NEGATIVE
Glucose, UA: 250 mg/dL — AB
Hgb urine dipstick: NEGATIVE
Ketones, ur: NEGATIVE mg/dL
Leukocytes, UA: NEGATIVE
Nitrite: NEGATIVE
Protein, ur: 30 mg/dL — AB
Specific Gravity, Urine: 1.018 (ref 1.005–1.030)
Urobilinogen, UA: 1 mg/dL (ref 0.0–1.0)
pH: 6 (ref 5.0–8.0)

## 2010-05-06 LAB — CARDIAC PANEL(CRET KIN+CKTOT+MB+TROPI)
CK, MB: 24.6 ng/mL — ABNORMAL HIGH (ref 0.3–4.0)
Relative Index: 13.5 — ABNORMAL HIGH (ref 0.0–2.5)
Total CK: 182 U/L — ABNORMAL HIGH (ref 7–177)
Troponin I: 4.3 ng/mL (ref 0.00–0.06)

## 2010-05-06 LAB — URINALYSIS, ROUTINE W REFLEX MICROSCOPIC
Bilirubin Urine: NEGATIVE
Glucose, UA: NEGATIVE mg/dL
Hgb urine dipstick: NEGATIVE
Ketones, ur: NEGATIVE mg/dL
Nitrite: NEGATIVE
Protein, ur: NEGATIVE mg/dL
Specific Gravity, Urine: 1.004 — ABNORMAL LOW (ref 1.005–1.030)
Urobilinogen, UA: 0.2 mg/dL (ref 0.0–1.0)
pH: 6 (ref 5.0–8.0)

## 2010-05-06 LAB — TROPONIN I
Troponin I: 2.29 ng/mL (ref 0.00–0.06)
Troponin I: 4.28 ng/mL (ref 0.00–0.06)

## 2010-05-06 LAB — CULTURE, BAL-QUANTITATIVE

## 2010-05-06 LAB — CULTURE, BAL-QUANTITATIVE W GRAM STAIN: Colony Count: >=100000

## 2010-05-06 LAB — TYPE AND SCREEN
ABO/RH(D): A NEG
Antibody Screen: NEGATIVE

## 2010-05-06 LAB — HEPARIN LEVEL (UNFRACTIONATED): Heparin Unfractionated: 0.16 IU/mL — ABNORMAL LOW (ref 0.30–0.70)

## 2010-05-06 LAB — BASIC METABOLIC PANEL
BUN: 6 mg/dL (ref 6–23)
CO2: 28 mEq/L (ref 19–32)
Calcium: 8.3 mg/dL — ABNORMAL LOW (ref 8.4–10.5)
Chloride: 104 mEq/L (ref 96–112)
Creatinine, Ser: 0.62 mg/dL (ref 0.4–1.2)
GFR calc Af Amer: >60 mL/min (ref >60)
GFR calc non Af Amer: >60 mL/min (ref >60)
Glucose, Bld: 104 mg/dL — ABNORMAL HIGH (ref 70–99)
Potassium: 3.7 mEq/L (ref 3.5–5.1)
Sodium: 138 mEq/L (ref 135–145)

## 2010-05-06 LAB — LEGIONELLA ANTIGEN, URINE: Legionella Antigen, Urine: NEGATIVE

## 2010-05-06 LAB — PHOSPHORUS: Phosphorus: 1.7 mg/dL — ABNORMAL LOW (ref 2.3–4.6)

## 2010-05-06 LAB — POCT CARDIAC MARKERS
CKMB, poc: <1 ng/mL — ABNORMAL LOW (ref 1.0–8.0)
Myoglobin, poc: 47.2 ng/mL (ref 12–200)
Troponin i, poc: <0.05 ng/mL (ref 0.00–0.09)

## 2010-05-06 LAB — MAGNESIUM: Magnesium: 1.4 mg/dL — ABNORMAL LOW (ref 1.5–2.5)

## 2010-05-06 LAB — GLUCOSE, CAPILLARY
Glucose-Capillary: 118 mg/dL — ABNORMAL HIGH (ref 70–99)
Glucose-Capillary: 91 mg/dL (ref 70–99)
Glucose-Capillary: 93 mg/dL (ref 70–99)
Glucose-Capillary: 99 mg/dL (ref 70–99)

## 2010-05-06 LAB — PROTIME-INR
INR: 1.4 (ref 0.00–1.49)
INR: 1.7 — ABNORMAL HIGH (ref 0.00–1.49)
Prothrombin Time: 18 seconds — ABNORMAL HIGH (ref 11.6–15.2)
Prothrombin Time: 21.1 seconds — ABNORMAL HIGH (ref 11.6–15.2)

## 2010-05-06 LAB — ABO/RH: ABO/RH(D): A NEG

## 2010-05-06 LAB — LACTIC ACID, PLASMA
Lactic Acid, Venous: 1.8 mmol/L (ref 0.5–2.2)
Lactic Acid, Venous: 3.4 mmol/L — ABNORMAL HIGH (ref 0.5–2.2)

## 2010-05-06 LAB — TSH: TSH: 0.56 u[IU]/mL (ref 0.350–4.500)

## 2010-05-06 LAB — STREP PNEUMONIAE URINARY ANTIGEN: Strep Pneumo Urinary Antigen: NEGATIVE

## 2010-05-06 LAB — APTT: aPTT: 35 seconds (ref 24–37)

## 2010-06-10 NOTE — Consult Note (Signed)
NAMETAIMA, RADA NO.:  [id number]   MEDICAL RECORD NO.:  [id number]          PATIENT TYPE:  INP   LOCATION:  2104                         FACILITY:  [facility]   PHYSICIAN:  Gerrit Friends. Dietrich Pates, MD, FACCDATE OF BIRTH:  09-13-1955   DATE OF CONSULTATION:  06/25/2008  DATE OF DISCHARGE:                                 CONSULTATION   PRIMARY CARE PHYSICIAN:  None known.   PRIMARY CARDIOLOGIST:  None.   CHIEF COMPLAINT:  Elevated cardiac enzymes.   HISTORY OF PRESENT ILLNESS:  [name] is a 55 year old female with no  previous history of coronary artery disease.  She has been sick for  about 4 days and had chest pain starting 3 days ago.  Her symptoms  worsened, and she came to the emergency room.  She was found to be  febrile and have pneumonia.  She was admitted by Internal Medicine.  After admission, her oxygen saturation dropped, and her systolic blood  pressure dropped to 70s.  She was felt to have septic shock, and  Critical Care Medicine was called.  They felt that endotracheal  intubation was indicated, and this was performed.  On admission, she was  complaining of chest pain only with inspiration, but prior to intubation  she was complaining of chest pain constantly, and her respirations were  very shallow.  Currently, she is intubated and sedated.   PAST MEDICAL HISTORY:  She has no history of hypertension or  hyperlipidemia.   She has a history of tobacco use, quit 2 years, and remote history of  illicit drug abuse.  She has a history of seasonal allergies and herpes.   PAST SURGICAL HISTORY:  She is status post corneal transplant.   ALLERGIES:  No known drug allergies.   CURRENT MEDICATIONS:  1. Ventolin 2.5 mg q.6 h.  2. Zithromax 500 mg a day.  3. Rocephin 2 g daily.  4. Sliding scale insulin.  5. Methadone 30 mg q.12 h.  6. Singulair 10 mg daily.  7. Tamiflu 75 mg b.i.d.  8. Protonix 40 mg a day.  9. Potassium chloride p.r.n.  10.Senokot at bedtime.   SOCIAL HISTORY:  [place].  She also  lives with her son.  She used to smoke lightly, according to the  patient, and quit 2 years ago.  She has a history of illicit drug use  and quit that 16 years ago, currently on a methadone program.  She  denies alcohol abuse.   FAMILY HISTORY:  Her mother is living and does not have coronary artery  disease.  Her father died with lung cancer but no history of coronary  artery disease, and no siblings have CAD.   REVIEW OF SYSTEMS:  Not obtainable secondary to the patient being  intubated and sedated.   PHYSICAL EXAMINATION:  VITAL SIGNS:  Temperature 98.3, blood pressure  138/70, pulse 80, respiratory rate 22, and O2 saturation 96% on 100% O2  by ventilation.  GENERAL:  She is a well-developed and well-nourished white female who is  sedated on the vent.  HEENT:  Normal except for pupils, although round and equal are  nonreactive secondary to sedation.  NECK:  She has JVD noted, but she is in Trendelenburg currently because  of hypotension.  No carotid bruits.  No thyromegaly.  No lymphadenopathy  is noted.  CHEST:  She has bilateral rhonchi with a slight wheeze and some rales.  CV:  Heart is regular in rate and rhythm with an S1 and S2 noted, but no  murmur, rub, or gallop is appreciated.  SKIN:  No lesions are noted.  ABDOMEN:  Soft, and she has active bowel sounds.  MUSCULOSKELETAL:  There is no joint deformity or effusions.  EXTREMITIES:  There is no edema.  No cyanosis or clubbing noted.  Distal  pulses are intact in all 4 extremities, and no femoral bruits are  appreciated.  NEUROLOGIC:  She is sedated and has received paralytics, therefore  unresponsive.   LABORATORY VALUES:  Hemoglobin 11.5, hematocrit 33.9, WBC is 10.3, and  platelets 124.  Sodium 141, potassium 3.0, chloride 109, CO2 of 22, BUN  8, creatinine 0.67, and glucose 150.  Point-of-care markers negative x1.   Troponin I 2.29, then 4.28.  INR 1.4.  ABG: 7.377, pCO2 of 32.1, pO2 56,  and bicarb 18.9.   Chest x-ray shows left lower lobe pneumonia.   EKG:  Sinus rhythm, rate 76 with no acute ischemic changes and no Q  waves.  There are inverted T-waves in V1 and V2.   IMPRESSION:  Ms. Revelle was seen today by Dr. Dietrich Pates.  Her exam is  unremarkable.  She has a moderately increased troponin with no other  markers available at this time.  Her EKG is not acute.  It is doubtful  that she has had a substantial myocardial infarction or that ischemic  heart disease is a major issue at the present time.  We will check an  echo and add aspirin if there is no coagulopathy.  Hemodynamics have  improved after intubation, although she is currently still on  phenylephrine for blood pressure support.  We will continue to follow  her closely.      Theodore Demark, PA-C      Gerrit Friends. Dietrich Pates, MD, [facility]  Electronically Signed    RB/MEDQ  D:  06/25/2008  T:  06/25/2008  Job:  [id number]

## 2010-06-10 NOTE — H&P (Signed)
[name], [name]               ACCOUNT NO.:  [id number]   MEDICAL RECORD NO.:  [id number]          PATIENT TYPE:  INP   LOCATION:  1830                         FACILITY:  [facility]   PHYSICIAN:  Manus Gunning, MD      DATE OF BIRTH:  05-22-1955   DATE OF ADMISSION:  06/24/2008  DATE OF DISCHARGE:                              HISTORY & PHYSICAL   CHIEF COMPLAINT:  Chest pain with shortness of breath.   HISTORY OF PRESENT ILLNESS:  [name] is a pleasant 55 year old  Caucasian female who claims that she started developing rhinorrhea with  a sore throat and shortness of breath approximately four days ago and  subsequently developed chest pain approximately three days ago.  Her  primary care physician prescribed her azithromycin four days ago and  despite taking the medication she has had no improvement.  On arrival to  the emergency department she was found to have a temperature of 101.4  which increased to 102.1 in the ED.  Chest x-ray demonstrated bilateral  infiltrates consistent with pneumonia.  She has no leukocytosis with a  white count of 9400 but with a left shift of 82%.  The patient herself  claims that she is unable to take deep breaths secondary to significant  pleuritic chest pain which is also reproducible on palpation.  During my  examination her O2 saturation on room air dropped to 88% and the patient  claims that she is unable to take deep breaths to bring it up.  With  supplemental oxygen at 2 L per minute her O2 saturations are at 95-98%.  I discussed with the patient that based on her age and that she has no  significant past medical history, especially in regards to chronic  disease with normal vital signs save for temperature, she is relatively  low risk and could be discharged home.  The patient herself is very  worried about going home and requested monitoring overnight.  She claims  that she just lives at home with her mother and her son and is  uncomfortable  in her present condition to be at home with them.  She  denies syncope, presyncope.  No positive headache, no blurring of  vision, no tinnitus.  No neck pain, no neck fullness.  No odynophagia,  no dysphagia.  No palpitations, no PND, no orthopnea.  Positive  shortness of breath, positive dyspnea on exertion.  Mild cough just  started today.  No expectoration.  No abdominal pain, no nausea or  vomiting, no diarrhea or constipation.  No dysuria, polyuria, hematuria.  No bright red blood per rectum.  No melanotic stools.  The patient  apparently has had a history with drug problems, drug abuse 16 years ago  and has been on methadone since.  She claims that she has been clean for  16 years.   PAST MEDICAL/SURGICAL HISTORY:  1. Corneal transplant.  2. 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   SOCIAL HISTORY:  Quit tobacco two years ago, claims that she used to  smoke approximately two cigarettes per day for 20 years.  Denies illicit  drug use, denies alcohol use.   FAMILY HISTORY:  Father had a history of lung cancer.  Mother she claims  was healthy.   ALLERGIES:  No known drug allergies.   HOME MEDICATIONS:  1. Singulair 10 mg daily.  2. Allegra 180 mg daily.  3. Valtrex 1000 mg three times daily.  4. Methadone 63 mg once daily.   REVIEW OF SYSTEMS:  Essentially 14-point review of systems performed.  Pertinent positives and negatives as described above.   PHYSICAL EXAMINATION:  VITAL SIGNS:  Upon presentation temperature  101.4, heart rate 75, respiratory rate 16, blood pressure 109/65, O2  saturations at 100% on 2 L nasal cannula, 89% on room air.  GENERAL:  Well-nourished, well-developed, Caucasian lady lying in bed in  obvious discomfort.  HEENT:  Normocephalic, atraumatic.  Dry oral mucosa.  No thrush,  erythema, or post nasal drip.  Eyes anicteric.  Extraocular muscles are  intact.  Pupils are equal and reactive to light and  accommodation.  NECK:  Supple with good range of motion.  No thyromegaly.  No carotid  bruits.  Neck veins appear flat.  CARDIOVASCULAR:  S1/S2 normal.  Regular rate and rhythm.  No murmurs,  rubs, or gallops.  RESPIRATORY:  Air entry is bilaterally equal with moderate movement  secondary to splinting.  There are coarse breath sounds and no rales or  rhonchi or wheezes appreciated.  ABDOMEN:  Soft, nontender, nondistended.  Positive bowel sounds.  No  organomegaly.  EXTREMITIES:  No cyanosis, clubbing, or edema.  Positive bilateral  dorsalis pedis.  CENTRAL NERVOUS SYSTEM:  Alert, oriented x3.  Cranial nerves II-XII  grossly intact.  Power, sensation, reflexes bilaterally symmetrical.  SKIN:  No breakdown, swelling, ulcerations, or masses.  HEMATOLOGY/ONCOLOGY:  No palpable lymphadenopathy.  No ecchymosis,  bruising, or petechiae.   LABORATORY DATA:  UA is negative for nitrite or leukocytes, negative for  blood, protein, ketones, bilirubin, and glucose.  Sodium 138, potassium  3.7, chloride 104, CO2 28, glucose 104, BUN 6, creatinine 0.62, calcium  8.3.  White blood cell count 9200, hemoglobin 12.7, hematocrit 37,  platelet count 125, polymorphs 82%.   ASSESSMENT/PLAN:  1. Pneumonia.  The patient's PORT score is 1 and she is low risk.  I      have discussed outpatient treatment for her but she is very      hesitant and tearful in regards to going home.  Her O2 saturations      are 88-89 on room air.  Based on this we will admit her, start      Moxifloxacin 400 mg IV daily, and treat chest pain with ibuprofen      800 mg three times a day p.r.n. as well as Tylenol 650 mg q.6 h. as      needed.  Continue oxygen supplementation to keep O2 saturation      greater than 92% and if in the a.m. the patient's O2 saturations      are greater than 90 on room air and her chest pain has improved we      will discharge her home.  2. GI/DVT prophylaxis.  Start Protonix 40 mg p.o. daily and  heparin      5000 units subcu q.8 h.      Manus Gunning, MD  Electronically Signed     SP/MEDQ  D:  06/24/2008  T:  06/25/2008  Job:  [id number]

## 2010-06-10 NOTE — Consult Note (Signed)
[name], VANDERVOORT NO.:  [id number]   MEDICAL RECORD NO.:  [id number]          PATIENT TYPE:  INP   LOCATION:  6702                         FACILITY:  [facility]   PHYSICIAN:  Acey Lav, MD  DATE OF BIRTH:  [birth year]   DATE OF CONSULTATION:  07/04/2008  DATE OF DISCHARGE:                                 CONSULTATION   REQUESTING PHYSICIAN:  Theodosia Paling, MD.   REASON FOR INFECTIOUS DISEASE CONSULTATION:  Patient with methicillin-  resistant Staphylococcus aureus pneumonia with cavitary involvement of  her lower lobes and moderate size pleural effusion.   DISCUSSION:  For details, please see the resident's written note.  I  examined the patient, reviewed the medical record with the resident and  I agree with the plan with his assessment and plan as documented in his  note.   Briefly, this is a 55 year old Caucasian female with a past medical  history significant for prior recreational drug use including  intravenous drug use, currently on methadone maintenance, who is  admitted with chest pain and shortness of breath.  She rapidly developed  a severe pneumonia and required intubation.  She was intubated on the  31st and covered broadly.  Her bronchoalveolar lavage has grown  methicillin-resistant Staphylococcus aureus which was sensitive to  trimethoprim sulfa, vancomycin, tetracycline, rifampin and clindamycin  (with E test having been done).  Patient has been covered broadly with  vancomycin and Zosyn and today intravenous linezolid was apparently  written for.  She is critically ill and received pressors and stress  dose steroids which now have been tapered.  In the interim her labs were  pertinent for climbing white count which has  now gone up to 27,000.  She had imaging done today, CT scan of the chest,  which showed a  moderate size right-sided pleural effusion.  Additionally it showed  extensive infiltrates predominantly in her lower lobes  with some nodular  and cavitary aspects to them, particularly prominent on the left but  also present on the right as well.  Currently, she is clinically doing  relatively well.  She is hemodynamically stable. She is saturating on  room air when she sits up, although when she is supine she requires  supplemental oxygen.   Her exam is pertinent for decreased breath sounds on the right.  Cardiovascular exam reveals regular rate and rhythm with no murmurs,  gallops or rubs heard.  The rest of her exam was relatively  unremarkable.   Her labs have been reviewed history and most pertinent for elevated  white count 27,700 with 316 platelets.  Her CT scan was reviewed as  well.  Her transthoracic echocardiogram failed to show any vegetations  on 2-D echo and had a normal ejection fraction.   This patient has a severe MRSA pneumonia with elements of necrotizing  infection in her lower lobes.  She has been broadly covered with both  vancomycin and Zosyn.  I think it is reasonable to change her to a drug  such as linezolid which she can take orally which penetrates well into  the  lungs.  I am however concerned about the presence of her pleural  effusion and I feel quite strongly that this needs to be tapped.  I have  discussed the Dr. Molli Knock from Critical Care Medicine and he agrees with  this.  Will have IR tap her pleural effusion and sent it for protein,  LDH, glucose, cultures for aerobes and anaerobes.  As mentioned, we will  discontinue her vancomycin and Zosyn and put her on linezolid alone.  We  will also give her supplemental B6 at 50 mg a day to try reduce toxicity  of linezolid.  We will monitor her clinically.  I would like to see her  white blood cell count going down.  I would like to check some  Clostridium difficile toxins in case there is an alternative reason  first her white count going higher.  Again I am  concerned about the effusion.  I think tapping is quite critical.   Certainly if she has low glucose or other worrisome features when the  fluid is aspirated, she may need chest tube.  We will follow along  closely.  Thank you for this fascinating infectious disease consult.      Acey Lav, MD  Electronically Signed     CV/MEDQ  D:  07/04/2008  T:  07/04/2008  Job:  [id number]   cc:   Felipa Evener, MD  Theodosia Paling, MD

## 2010-06-10 NOTE — Group Therapy Note (Signed)
Beverly, Dunn NO.:  [id number]   MEDICAL RECORD NO.:  [id number]          PATIENT TYPE:  INP   LOCATION:  6739                         FACILITY:  [facility]   PHYSICIAN:  Ruthy Dick, MD    DATE OF BIRTH:  [birth year]                                 PROGRESS NOTE   PROBLEM LIST TO DATE:  1. Respiratory failure status post intubation and extubation.  The      patient is still a little bit hypoxic at this time.  We plan to      check a D-dimer again and we will consider a CT angiogram if her D-      dimer is still up.  Initial D-dimer was up but the patient was      septic at the time.  2. Pneumonia, MRSA noted in the sputum.  The patient has had 7 days of      vancomycin today and we will discontinue vancomycin.  3. Diastolic congestive heart failure exacerbation, stable at this      time.  Not on active diuresis.  4. Septic shock, resolved.  5. Hypokalemia, resolved.  6. Hyperglycemia, likely steroid-induced.  7. Non-ST elevation myocardial infarction.  This is thought to be      secondary to edema and ischemia by the cardiology team.  They do      not anticipate any further workup at this time.  8. Leukocytosis.  This seems to be steroid-induced.  9. Methadone dependence.  The patient is back on her home dose of      methadone dependence.   DISPOSITION:  This patient is still very weak and she is not very steady on her feet  at this time.  She still needs at least a couple of days of her physical  therapy prior to being discharged home.   BRIEF HISTORY OF PRESENTING ILLNESS/HOSPITAL COURSE:  [name] is a 55 year old lady who came into the hospital with  shortness of breath that started 4 days prior to presentation.  She was  admitted to the floor because she also had a fever of 101.4.  Her  breathing eventually got worse and she was intubated and taken to the  intensive care unit.  She was diagnosed as having diastolic congestive  heart failure  and pneumonia.  For her pneumonia MRSA was isolated in her  sputum and she was then treated with vancomycin.  Today is day #7 and we  plan to discontinue vancomycin.  The patient however remains hypoxic and  we plan to evaluate her for both home oxygen and also get a  D-dimer.  If the D-dimer is elevated at this point we would consider doing a CT  angiogram.  Otherwise the patient said she is doing much better.  Her  shortness of breath has improved though she still needs oxygen  supplementation.  No chest pain, no abdominal pain, no nausea or  vomiting or diarrhea.  No constipation.   VITALS:  Show a temperature of 97.8, pulse 66, respiration 18, blood  pressure 114/68, saturating 93% on 3 liters nasal cannula.  The patient used to be a drug addict and this is the reason she is on  methadone.   Presently this patient is on the following medications:  1. Albuterol nebulization p.r.n.  2. Aspirin 325 daily.  3. Colace.  4. Gatifloxacin eye drops.  5. Methadone 60 mg daily.  6. Singulair.  7. Protonix.  8. Prednisone eye ointment.  9. Prednisone 10 mg p.o. daily.  10.Senokot.  11.Valtrex 100 mg p.o. t.i.d.  12.Vancomycin which will be discontinued today.      Ruthy Dick, MD  Electronically Signed     GU/MEDQ  D:  07/03/2008  T:  07/03/2008  Job:  [id number]

## 2010-06-10 NOTE — Discharge Summary (Signed)
[name], [name]               ACCOUNT NO.:  [id number]   MEDICAL RECORD NO.:  [id number]          PATIENT TYPE:  INP   LOCATION:  6714                         FACILITY:  [facility]   PHYSICIAN:  Theodosia Paling, MD    DATE OF BIRTH:  1955-04-03   DATE OF ADMISSION:  06/24/2008  DATE OF DISCHARGE:  07/07/2008                               DISCHARGE SUMMARY   PRIMARY CARE PHYSICIAN:  Ralene Ok, MD   DISCHARGE DIAGNOSES:  1. Necrotizing pneumonia due to methicillin-resistant Staphylococcus      aureus leading onto respiratory failure status post intubation and      extubation.  2. Septic shock due to above.  3. Hyperglycemia, steroid induced.  4. Leukocytosis, steroid-induced and secondary to infection.  5. Non-ST-segment elevation myocardial infarction due to demand      ischemia.   SECONDARY DIAGNOSES:  1. Methadone dependence.  2. History of diastolic congestive heart failure.   DISCHARGE MEDICATIONS:  The patient is to continue home medications  which include following:  1. Singulair 10 mg p.o. daily.  2. Allegra 180 mg p.o. daily.  3. Valtrex 1000 mg p.o. q.8 h.  4. Methadone 63 mg p.o. daily.   NEW MEDICATION ADDED:  1. Linezolid 600 mg p.o. q.12 h. for 11 days.  2. Pyridoxine 25 mg p.o. daily for 2 weeks.  Please refer to the      excellent discharge summary dictated by Dr. Ruthy Dick on July 03, 2008, for detailed hospital course.   PROCEDURE PERFORMED:  In addition, following interim hospital course has  transpired since July 03, 2008.  1. Shortness of breath.  The patient had remote pleuritic pain and      shortness of breath.  CT results showed cavitary pneumonia.  Given      that cavitation developed while the patient was on IV vancomycin to      assist over to p.o. linezolid for better tissue bioavailability and      the patient had symptomatic improvement that she also had      associated pleural effusion, she underwent tapping for that.      Phlegm  culture so far is negative for MRSA.  Infectious disease      consult by Dr. Paulette Blanch Dam was provided who recommended to      continue linezolid in total of [redacted] weeks along with pyridoxine.  The      patient's oxygen saturation at this time is 94% on room air at rest      and more than 92% on ambulation.  She is going to follow up with      the primary care physician, Dr. Ralene Ok for the followup with      CBC.  2. Leukocytosis, most likely it was steroid induced and also due to      necrotizing pneumonia.  WBC count has fallen down to 16,000.  She      is going to follow with CBC with the primary care physician, Dr.      Ralene Ok.  3. Hypoglycemia.  The patient had steroid-induced hyperglycemia which      is now resolved.  4. Methadone dependency.  We will continue the patient on home dose      and her pain appeared to be adequately controlled.  5. Diastolic heart failure, appears to be stable at this time.   DISCHARGE LABORATORY DATA:  WBC 16.5, hemoglobin 10.1, hematocrit 29.8,  and platelet count 353.   DISCHARGE VITAL SIGNS:  Temperature 98, pulse 70, respiratory rate 18,  blood pressure 119/69, and oxygen saturation 94% on room air.  The  patient appears clinically stable to go.  She looks a little bit  lethargic.  She does have 24-hour care at home, however, will still  arrange some PT/OT and a wheeled walker for her.   DISPOSITION:  The patient is going to follow with Dr. Ralene Ok as an  outpatient within 1-week time for followup on CBC.  He will also follow  up on phlegm and blood culture.  Total time spent in discharge of this  patient around 1 hour 15 minutes.      Theodosia Paling, MD  Electronically Signed     NP/MEDQ  D:  07/07/2008  T:  07/07/2008  Job:  [id number]   cc:   Ralene Ok, M.D.

## 2010-11-26 ENCOUNTER — Ambulatory Visit (HOSPITAL_COMMUNITY)
Admission: RE | Admit: 2010-11-26 | Discharge: 2010-11-26 | Disposition: A | Discharge status: Home / Self Care | Payer: PRIVATE HEALTH INSURANCE | Source: Ambulatory Visit | Attending: Physical Medicine and Rehabilitation | Admitting: Physical Medicine and Rehabilitation

## 2010-11-26 ENCOUNTER — Other Ambulatory Visit (HOSPITAL_COMMUNITY): Payer: Self-pay | Admitting: Physical Medicine and Rehabilitation

## 2010-11-26 DIAGNOSIS — I700 Atherosclerosis of aorta: Secondary | ICD-10-CM | POA: Insufficient documentation

## 2010-11-26 DIAGNOSIS — M545 Low back pain, unspecified: Secondary | ICD-10-CM

## 2010-11-26 DIAGNOSIS — M5126 Other intervertebral disc displacement, lumbar region: Secondary | ICD-10-CM | POA: Insufficient documentation

## 2010-11-26 DIAGNOSIS — R200 Anesthesia of skin: Secondary | ICD-10-CM

## 2010-11-27 ENCOUNTER — Inpatient Hospital Stay (HOSPITAL_COMMUNITY): Admission: RE | Admit: 2010-11-27 | Payer: Worker's Compensation | Source: Ambulatory Visit

## 2010-12-24 ENCOUNTER — Encounter: Payer: Self-pay | Admitting: Emergency Medicine

## 2010-12-24 ENCOUNTER — Emergency Department
Admit: 2010-12-24 | Discharge: 2010-12-24 | Disposition: A | Discharge status: Home / Self Care | Payer: Worker's Compensation

## 2010-12-24 ENCOUNTER — Emergency Department (INDEPENDENT_AMBULATORY_CARE_PROVIDER_SITE_OTHER)
Admission: EM | Admit: 2010-12-24 | Discharge: 2010-12-24 | Disposition: A | Discharge status: Home / Self Care | Payer: PRIVATE HEALTH INSURANCE | Source: Home / Self Care | Attending: Family Medicine | Admitting: Family Medicine

## 2010-12-24 DIAGNOSIS — S93602A Unspecified sprain of left foot, initial encounter: Secondary | ICD-10-CM

## 2010-12-24 DIAGNOSIS — S82899A Other fracture of unspecified lower leg, initial encounter for closed fracture: Secondary | ICD-10-CM

## 2010-12-24 DIAGNOSIS — S82839A Other fracture of upper and lower end of unspecified fibula, initial encounter for closed fracture: Secondary | ICD-10-CM

## 2010-12-24 DIAGNOSIS — S93429A Sprain of deltoid ligament of unspecified ankle, initial encounter: Secondary | ICD-10-CM

## 2010-12-24 DIAGNOSIS — S93609A Unspecified sprain of unspecified foot, initial encounter: Secondary | ICD-10-CM

## 2010-12-24 DIAGNOSIS — S93409A Sprain of unspecified ligament of unspecified ankle, initial encounter: Secondary | ICD-10-CM

## 2010-12-24 DIAGNOSIS — S93402A Sprain of unspecified ligament of left ankle, initial encounter: Secondary | ICD-10-CM

## 2010-12-24 NOTE — ED Notes (Signed)
Left ankle injury x 1 week ago, turned ankle while walking, swollen and bruised

## 2010-12-28 NOTE — ED Provider Notes (Signed)
History     CSN: [id number] Arrival date & time: 12/24/2010 12:58 PM   First MD Initiated Contact with Patient 12/24/10 1330      Chief Complaint  Patient presents with  . Ankle Injury      HPI Comments: While walking her dog one week ago, patient's left foot gave way and inverted.  She has had persistent pain and swelling around the ankle, and now has had swelling of the foot and bruising around toes.  Patient is a 55 y.o. female presenting with lower extremity injury. The history is provided by the patient.  Ankle Injury This is a new problem. Episode onset: one week ago. The problem occurs constantly. The problem has not changed since onset.Pertinent negatives include no chest pain and no shortness of breath. The symptoms are aggravated by walking and standing. The symptoms are relieved by nothing.    Past medical history:  Osteoporosis  No past surgical history on file.  No family history on file.  History  Substance Use Topics  . Smoking status: Not on file  . Smokeless tobacco: Not on file  . Alcohol Use: No    OB History    Grav Para Term Preterm Abortions TAB SAB Ect Mult Living                  Review of Systems  Constitutional: Negative.   Respiratory: Negative.  Negative for shortness of breath.   Cardiovascular: Negative.  Negative for chest pain.  Musculoskeletal:       Pain in left foot and ankle  Skin: Negative.   Neurological: Negative for numbness.    Allergies  Review of patient's allergies indicates no known allergies.  Home Medications   Current Outpatient Rx  Name Route Sig Dispense Refill  . ALENDRONATE SODIUM 10 MG PO TABS Oral Take 10 mg by mouth daily before breakfast. Take with a full glass of water on an empty stomach.     . CELECOXIB 100 MG PO CAPS Oral Take 100 mg by mouth 2 (two) times daily.      Marland Kitchen CITALOPRAM HYDROBROMIDE 10 MG PO TABS Oral Take 10 mg by mouth daily.      Marland Kitchen VALACYCLOVIR HCL 1 G PO TABS Oral Take 1,000 mg by  mouth 2 (two) times daily.        BP 115/75  Pulse 84  Temp(Src) 98.1 F (36.7 C) (Oral)  Resp 18  Ht 5\' 2"  (1.575 m)  Wt 120 lb (54.432 kg)  BMI 21.95 kg/m2  SpO2 94%  Physical Exam  Nursing note and vitals reviewed. Constitutional: She appears well-developed and well-nourished. No distress.  Eyes: Pupils are equal, round, and reactive to light.  Cardiovascular: Normal heart sounds.   Pulmonary/Chest: Breath sounds normal.  Musculoskeletal:       Left ankle: She exhibits decreased range of motion and swelling. She exhibits no ecchymosis, no deformity, no laceration and normal pulse. tenderness. Lateral malleolus, medial malleolus and proximal fibula tenderness found. No head of 5th metatarsal tenderness found. Achilles tendon normal.       Left foot: She exhibits tenderness, bony tenderness and swelling. She exhibits normal range of motion, normal capillary refill, no crepitus, no deformity and no laceration.       Feet:       Left ankle:  Decreased range of motion.  Tenderness and swelling over the lateral malleolus.  Tenderness over the fibula extends about 6 inches proximally above the lateral mealleolus.  There is  also mild tenderness over the medial malleolus. Joint stable.  No tenderness over the base of the fifth metatarsal.  Distal neurovascular function is intact.   Left foot reveals mild tenderness and swelling dorsally over the metatarsals.  There is faint ecchymosis distally over the toes.      ED Course  Procedures  none    Narrative:   *RADIOLOGY REPORT*  Clinical Data: Pain and tenderness over distal fibula. Twisting injury.  LEFT TIBIA AND FIBULA - 2 VIEW  Comparison: None.  Findings: There is a fracture through the tip of the lateral malleolus, seen better on the ankle series. No tibial abnormality. No additional acute findings. Sclerotic focus in the proximal tibia likely in a flexed bone island.  IMPRESSION: Nondisplaced fracture through the tip of  the lateral malleolus.  Original Report Authenticated By: Cyndie Chime, M.D.            DG Ankle Complete Left (Final result)   Result time:12/24/10 1432    Final result by Rad Results In Interface (12/24/10 14:32:00)    Narrative:   *RADIOLOGY REPORT*  Clinical Data: Ankle injury with pain over the medial and lateral malleoli.  LEFT ANKLE COMPLETE - 3+ VIEW  Comparison: None.  Findings: There is soft tissue swelling about the ankle joint. A nondisplaced avulsion fracture along the inferior aspect of the lateral malleolus is seen.  IMPRESSION: Nondisplaced avulsion fracture along the inferior aspect of the lateral malleolus. Diffuse soft tissue swelling.  Original Report Authenticated By: Reyes Ivan, M.D.            DG Foot Complete Left (Final result)   Result time:12/24/10 1430    Final result by Rad Results In Interface (12/24/10 14:30:24)    Narrative:   *RADIOLOGY REPORT*  Clinical Data: Injury with tenderness and swelling over the second through fifth metatarsals.  LEFT FOOT - COMPLETE 3+ VIEW  Comparison: 09/03/2005.  Findings: No acute osseous abnormality. There is mild forefoot soft tissue swelling.  IMPRESSION: No acute osseous abnormality.  Original Report Authenticated By: Reyes Ivan, M.D.           Narrative:   *RADIOLOGY REPORT*  Clinical Data: Pain and tenderness over distal fibula. Twisting injury.  LEFT TIBIA AND FIBULA - 2 VIEW  Comparison: None.  Findings: There is a fracture through the tip of the lateral malleolus, seen better on the ankle series. No tibial abnormality. No additional acute findings. Sclerotic focus in the proximal tibia likely in a flexed bone island.  IMPRESSION: Nondisplaced fracture through the tip of the lateral malleolus.  Original Report Authenticated By: Cyndie Chime, M.D.       1. Fracture of fibula, distal, closed   2. Left ankle sprain   3. Sprain of left foot        MDM   Dispensed crutches and cam rocker boot.   Apply ice pack for 30 to 45 minutes several times daily.   Elevate leg. Will begin Celebrex at patient's request (no adverse effect in the past)   Wear splint and use crutches until evaluated by orthopedist within one week.  Elevate leg.  Continue Celebrex as prescribed.        Donna Christen, MD 12/28/10 [phone]

## 2011-01-27 HISTORY — PX: ENUCLEATION: SHX628

## 2011-05-04 ENCOUNTER — Ambulatory Visit: Payer: PRIVATE HEALTH INSURANCE | Attending: Physical Medicine and Rehabilitation

## 2011-05-04 DIAGNOSIS — IMO0001 Reserved for inherently not codable concepts without codable children: Secondary | ICD-10-CM | POA: Insufficient documentation

## 2011-05-04 DIAGNOSIS — M549 Dorsalgia, unspecified: Secondary | ICD-10-CM | POA: Insufficient documentation

## 2012-04-30 ENCOUNTER — Emergency Department
Admission: EM | Admit: 2012-04-30 | Discharge: 2012-04-30 | Disposition: A | Discharge status: Home / Self Care | Payer: PRIVATE HEALTH INSURANCE | Source: Home / Self Care | Attending: Family Medicine | Admitting: Family Medicine

## 2012-04-30 ENCOUNTER — Encounter: Payer: Self-pay | Admitting: *Deleted

## 2012-04-30 DIAGNOSIS — J40 Bronchitis, not specified as acute or chronic: Secondary | ICD-10-CM

## 2012-04-30 DIAGNOSIS — J069 Acute upper respiratory infection, unspecified: Secondary | ICD-10-CM

## 2012-04-30 MED ORDER — AZITHROMYCIN 250 MG PO TABS: ORAL_TABLET | ORAL | Status: DC

## 2012-04-30 MED ORDER — METHYLPREDNISOLONE ACETATE 80 MG/ML IJ SUSP
80.0000 mg | Freq: Once | INTRAMUSCULAR | Status: AC
  Administered 2012-04-30: 80 mg via INTRAMUSCULAR

## 2012-04-30 NOTE — ED Notes (Signed)
Pt c/o body aches, productive cough x 2 wks has tried OTC Mucinex Dm with little relief.

## 2012-04-30 NOTE — ED Provider Notes (Signed)
History     CSN: [id number]  Arrival date & time 04/30/12  1100   First MD Initiated Contact with Patient 04/30/12 1112      Chief Complaint  Patient presents with  . Generalized Body Aches  . Cough    HPI  URI Symptoms Onset: 2 weeks  Description: rhinorrhea, nasal congestion, sinus drainage, cough  Modifying factors:  Has hx/o pneumonia in the past. Feels that sxs may become a PNA if left untreated.   Symptoms Nasal discharge: yes Fever: no Sore throat: no Cough: yes Wheezing: no Ear pain: no GI symptoms: no Sick contacts: no  Red Flags  Stiff neck: no Dyspnea: no Rash: no Swallowing difficulty: no  Sinusitis Risk Factors Headache/face pain: no Double sickening: no tooth pain: no  Allergy Risk Factors Sneezing: no Itchy scratchy throat: no Seasonal symptoms: no  Flu Risk Factors Headache: no muscle aches: no severe fatigue: mild   History reviewed. No pertinent past medical history.  History reviewed. No pertinent past surgical history.  History reviewed. No pertinent family history.  History  Substance Use Topics  . Smoking status: Not on file  . Smokeless tobacco: Not on file  . Alcohol Use: No    OB History   Grav Para Term Preterm Abortions TAB SAB Ect Mult Living                  Review of Systems  All other systems reviewed and are negative.    Allergies  Review of patient's allergies indicates no known allergies.  Home Medications   Current Outpatient Rx  Name  Route  Sig  Dispense  Refill  . alendronate (FOSAMAX) 10 MG tablet   Oral   Take 10 mg by mouth daily before breakfast. Take with a full glass of water on an empty stomach.          Marland Kitchen azithromycin (ZITHROMAX) 250 MG tablet      Take 2 tabs PO x 1 dose, then 1 tab PO QD x 4 days   6 tablet   0   . celecoxib (CELEBREX) 100 MG capsule   Oral   Take 100 mg by mouth 2 (two) times daily.           . citalopram (CELEXA) 10 MG tablet   Oral   Take 10 mg by  mouth daily.           . valACYclovir (VALTREX) 1000 MG tablet   Oral   Take 1,000 mg by mouth 2 (two) times daily.             BP 103/64  Pulse 65  Temp(Src) 97.5 F (36.4 C) (Oral)  Resp 16  Ht 5\' 2"  (1.575 m)  Wt 111 lb 12 oz (50.689 kg)  BMI 20.43 kg/m2  Physical Exam  Constitutional: She appears well-developed and well-nourished.  HENT:  Head: Normocephalic and atraumatic.  Right Ear: External ear normal.  Left Ear: External ear normal.  +nasal erythema, rhinorrhea bilaterally, + post oropharyngeal erythema    Eyes: Conjunctivae are normal. Pupils are equal, round, and reactive to light.  Neck: Normal range of motion. Neck supple.  Cardiovascular: Normal rate, regular rhythm and normal heart sounds.   Pulmonary/Chest: Effort normal and breath sounds normal.  Faint wheezes    Abdominal: Soft.  Musculoskeletal: Normal range of motion.  Neurological: She is alert.  Skin: Skin is warm.    ED Course  Procedures (including critical care time)  Labs Reviewed -  No data to display No results found.   1. URI (upper respiratory infection)   2. Bronchitis       MDM  Depo-Medrol 80 mg IM x1 for a mild wheezing. Will place on Z-Pak for atypical and lower respiratory coverage. Process you've chest x-ray. Patient feels that this isnt warranted Discuss infectious and respiratory red flags at length patient. Plan to followup with PCP in the next 3-5 days. Followup as needed.      The patient and/or caregiver has been counseled thoroughly with regard to treatment plan and/or medications prescribed including dosage, schedule, interactions, rationale for use, and possible side effects and they verbalize understanding. Diagnoses and expected course of recovery discussed and will return if not improved as expected or if the condition worsens. Patient and/or caregiver verbalized understanding.            Doree Albee, MD 04/30/12 1150

## 2012-06-27 ENCOUNTER — Other Ambulatory Visit: Payer: Self-pay | Admitting: Physical Medicine and Rehabilitation

## 2012-06-27 DIAGNOSIS — M810 Age-related osteoporosis without current pathological fracture: Secondary | ICD-10-CM

## 2013-02-07 ENCOUNTER — Other Ambulatory Visit: Payer: Self-pay | Admitting: Internal Medicine

## 2013-02-07 DIAGNOSIS — Z1231 Encounter for screening mammogram for malignant neoplasm of breast: Secondary | ICD-10-CM

## 2013-02-27 ENCOUNTER — Other Ambulatory Visit: Payer: Self-pay

## 2013-02-27 ENCOUNTER — Ambulatory Visit: Payer: Self-pay

## 2013-03-02 ENCOUNTER — Encounter: Payer: Self-pay | Admitting: Internal Medicine

## 2013-03-27 ENCOUNTER — Other Ambulatory Visit: Payer: Self-pay

## 2013-03-27 ENCOUNTER — Ambulatory Visit: Payer: Self-pay

## 2013-04-10 ENCOUNTER — Ambulatory Visit (AMBULATORY_SURGERY_CENTER): Payer: Self-pay | Admitting: *Deleted

## 2013-04-10 VITALS — Ht 62.5 in | Wt 112.0 lb

## 2013-04-10 DIAGNOSIS — Z1211 Encounter for screening for malignant neoplasm of colon: Secondary | ICD-10-CM

## 2013-04-10 MED ORDER — MOVIPREP 100 G PO SOLR: ORAL | Status: DC

## 2013-04-10 NOTE — Progress Notes (Signed)
No allergies to eggs or soy. No problems with anesthesia.  

## 2013-04-14 ENCOUNTER — Encounter: Payer: Self-pay | Admitting: Gastroenterology

## 2013-04-14 ENCOUNTER — Ambulatory Visit (AMBULATORY_SURGERY_CENTER): Payer: 59 | Admitting: Gastroenterology

## 2013-04-14 VITALS — BP 100/61 | HR 48 | Temp 96.9°F | Resp 31 | Ht 62.0 in | Wt 112.0 lb

## 2013-04-14 DIAGNOSIS — Z1211 Encounter for screening for malignant neoplasm of colon: Secondary | ICD-10-CM

## 2013-04-14 HISTORY — PX: COLONOSCOPY: SHX174

## 2013-04-14 MED ORDER — SODIUM CHLORIDE 0.9 % IV SOLN: 500.0000 mL | INTRAVENOUS | Status: DC

## 2013-04-14 NOTE — Op Note (Signed)
[place]  [address] [place], [id number]   COLONOSCOPY PROCEDURE REPORT  PATIENT: Beverly, Dunn  MR#: [id number] BIRTHDATE: 12/23/1955 , 62  yrs. old GENDER: Female ENDOSCOPIST: Milus Banister, MD REFERRED Mariel Craft, M.D. PROCEDURE DATE:  04/14/2013 PROCEDURE:   Colonoscopy, screening First Screening Colonoscopy - Avg.  risk and is 50 yrs.  old or older Yes.  Prior Negative Screening - Now for repeat screening. N/A  History of Adenoma - Now for follow-up colonoscopy & has been > or = to 3 yrs.  N/A  Polyps Removed Today? No.  Recommend repeat exam, <10 yrs? No. ASA CLASS:   Class II INDICATIONS:average risk screening. MEDICATIONS: MAC sedation, administered by CRNA and Propofol (Diprivan) 220 mg IV  DESCRIPTION OF PROCEDURE:   After the risks benefits and alternatives of the procedure were thoroughly explained, informed consent was obtained.  A digital rectal exam revealed no abnormalities of the rectum.   The LB [id number]  endoscope was introduced through the anus and advanced to the cecum, which was identified by both the appendix and ileocecal valve. No adverse events experienced.   The quality of the prep was good.  The instrument was then slowly withdrawn as the colon was fully examined.   COLON FINDINGS: A normal appearing cecum, ileocecal valve, and appendiceal orifice were identified.  The ascending, hepatic flexure, transverse, splenic flexure, descending, sigmoid colon and rectum appeared unremarkable.  No polyps or cancers were seen. Retroflexed views revealed no abnormalities. The time to cecum=6 minutes 24 seconds.  Withdrawal time=10 minutes 24 seconds.  The scope was withdrawn and the procedure completed. COMPLICATIONS: There were no complications.  ENDOSCOPIC IMPRESSION: Normal colon No polyps or cancers  RECOMMENDATIONS: You should continue to follow colorectal cancer screening guidelines for "routine risk"  patients with a repeat colonoscopy in 10 years.   eSigned:  Milus Banister, MD 04/14/2013 2:24 PM

## 2013-04-14 NOTE — Progress Notes (Signed)
Report to pacu rn, vss, bbs=clear 

## 2013-04-14 NOTE — Patient Instructions (Signed)
YOU HAD AN ENDOSCOPIC PROCEDURE TODAY AT THE [facility]: Refer to the procedure report that was given to you for any specific questions about what was found during the examination.  If the procedure report does not answer your questions, please call your gastroenterologist to clarify.  If you requested that your care partner not be given the details of your procedure findings, then the procedure report has been included in a sealed envelope for you to review at your convenience later.  YOU SHOULD EXPECT: Some feelings of bloating in the abdomen. Passage of more gas than usual.  Walking can help get rid of the air that was put into your GI tract during the procedure and reduce the bloating. If you had a lower endoscopy (such as a colonoscopy or flexible sigmoidoscopy) you may notice spotting of blood in your stool or on the toilet paper. If you underwent a bowel prep for your procedure, then you may not have a normal bowel movement for a few days.  DIET: Your first meal following the procedure should be a light meal and then it is ok to progress to your normal diet.  A half-sandwich or bowl of soup is an example of a good first meal.  Heavy or fried foods are harder to digest and may make you feel nauseous or bloated.  Likewise meals heavy in dairy and vegetables can cause extra gas to form and this can also increase the bloating.  Drink plenty of fluids but you should avoid alcoholic beverages for 24 hours.  ACTIVITY: Your care partner should take you home directly after the procedure.  You should plan to take it easy, moving slowly for the rest of the day.  You can resume normal activity the day after the procedure however you should NOT DRIVE or use heavy machinery for 24 hours (because of the sedation medicines used during the test).    SYMPTOMS TO REPORT IMMEDIATELY: A gastroenterologist can be reached at any hour.  During normal business hours, 8:30 AM to 5:00 PM Monday through Friday,  call [phone].  After hours and on weekends, please call the GI answering service at [phone] who will take a message and have the physician on call contact you.   Following lower endoscopy (colonoscopy or flexible sigmoidoscopy):  Excessive amounts of blood in the stool  Significant tenderness or worsening of abdominal pains  Swelling of the abdomen that is new, acute  Fever of 100F or higher  FOLLOW UP: If any biopsies were taken you will be contacted by phone or by letter within the next 1-3 weeks.  Call your gastroenterologist if you have not heard about the biopsies in 3 weeks.  Our staff will call the home number listed on your records the next business day following your procedure to check on you and address any questions or concerns that you may have at that time regarding the information given to you following your procedure. This is a courtesy call and so if there is no answer at the home number and we have not heard from you through the emergency physician on call, we will assume that you have returned to your regular daily activities without incident.  SIGNATURES/CONFIDENTIALITY: You and/or your care partner have signed paperwork which will be entered into your electronic medical record.  These signatures attest to the fact that that the information above on your After Visit Summary has been reviewed and is understood.  Full responsibility of the confidentiality of this   discharge information lies with you and/or your care-partner.  Recommendations You should continue to follow colorectal cancer screening guideline for "routine risk" patients with a repeat colonoscopy in 10 years.

## 2013-04-17 ENCOUNTER — Telehealth: Payer: Self-pay | Admitting: *Deleted

## 2013-04-17 NOTE — Telephone Encounter (Signed)
  Follow up Call-  No answer, left message to call if questions or concerns.    

## 2013-04-24 ENCOUNTER — Encounter: Payer: Self-pay | Admitting: Internal Medicine

## 2013-05-05 ENCOUNTER — Ambulatory Visit: Payer: Self-pay

## 2013-05-05 ENCOUNTER — Other Ambulatory Visit: Payer: Self-pay

## 2013-05-19 ENCOUNTER — Ambulatory Visit
Admission: RE | Admit: 2013-05-19 | Discharge: 2013-05-19 | Disposition: A | Discharge status: Home / Self Care | Payer: Self-pay | Source: Ambulatory Visit | Attending: Internal Medicine | Admitting: Internal Medicine

## 2013-05-19 ENCOUNTER — Encounter (INDEPENDENT_AMBULATORY_CARE_PROVIDER_SITE_OTHER): Payer: Self-pay

## 2013-05-19 ENCOUNTER — Other Ambulatory Visit: Payer: Self-pay

## 2013-05-19 DIAGNOSIS — Z1231 Encounter for screening mammogram for malignant neoplasm of breast: Secondary | ICD-10-CM

## 2013-07-19 ENCOUNTER — Other Ambulatory Visit (HOSPITAL_COMMUNITY): Payer: Self-pay | Admitting: Orthopedic Surgery

## 2013-07-19 DIAGNOSIS — M25512 Pain in left shoulder: Secondary | ICD-10-CM

## 2013-07-24 ENCOUNTER — Ambulatory Visit (HOSPITAL_COMMUNITY)
Admission: RE | Admit: 2013-07-24 | Discharge: 2013-07-24 | Disposition: A | Discharge status: Home / Self Care | Payer: 59 | Source: Ambulatory Visit | Attending: Orthopedic Surgery | Admitting: Orthopedic Surgery

## 2013-07-24 DIAGNOSIS — M719 Bursopathy, unspecified: Principal | ICD-10-CM | POA: Insufficient documentation

## 2013-07-24 DIAGNOSIS — M67919 Unspecified disorder of synovium and tendon, unspecified shoulder: Secondary | ICD-10-CM | POA: Insufficient documentation

## 2013-07-24 DIAGNOSIS — M25512 Pain in left shoulder: Secondary | ICD-10-CM

## 2013-08-11 ENCOUNTER — Ambulatory Visit (HOSPITAL_COMMUNITY)
Admission: RE | Admit: 2013-08-11 | Discharge: 2013-08-11 | Disposition: A | Discharge status: Home / Self Care | Payer: 59 | Source: Ambulatory Visit | Attending: Physical Medicine and Rehabilitation | Admitting: Physical Medicine and Rehabilitation

## 2013-08-11 ENCOUNTER — Other Ambulatory Visit (HOSPITAL_COMMUNITY): Payer: Self-pay | Admitting: Internal Medicine

## 2013-08-11 DIAGNOSIS — Z780 Asymptomatic menopausal state: Secondary | ICD-10-CM | POA: Insufficient documentation

## 2013-08-11 DIAGNOSIS — M810 Age-related osteoporosis without current pathological fracture: Secondary | ICD-10-CM

## 2013-08-11 DIAGNOSIS — Z13820 Encounter for screening for osteoporosis: Secondary | ICD-10-CM | POA: Insufficient documentation

## 2014-03-25 ENCOUNTER — Emergency Department (HOSPITAL_COMMUNITY): Payer: 59

## 2014-03-25 ENCOUNTER — Emergency Department (HOSPITAL_COMMUNITY)
Admission: EM | Admit: 2014-03-25 | Discharge: 2014-03-25 | Disposition: A | Discharge status: Home / Self Care | Payer: 59 | Attending: Emergency Medicine | Admitting: Emergency Medicine

## 2014-03-25 ENCOUNTER — Encounter (HOSPITAL_COMMUNITY): Payer: Self-pay | Admitting: Emergency Medicine

## 2014-03-25 DIAGNOSIS — Z791 Long term (current) use of non-steroidal anti-inflammatories (NSAID): Secondary | ICD-10-CM | POA: Diagnosis not present

## 2014-03-25 DIAGNOSIS — Z8739 Personal history of other diseases of the musculoskeletal system and connective tissue: Secondary | ICD-10-CM | POA: Diagnosis not present

## 2014-03-25 DIAGNOSIS — Y998 Other external cause status: Secondary | ICD-10-CM | POA: Diagnosis not present

## 2014-03-25 DIAGNOSIS — Z79899 Other long term (current) drug therapy: Secondary | ICD-10-CM | POA: Diagnosis not present

## 2014-03-25 DIAGNOSIS — S0181XA Laceration without foreign body of other part of head, initial encounter: Secondary | ICD-10-CM | POA: Insufficient documentation

## 2014-03-25 DIAGNOSIS — Z8659 Personal history of other mental and behavioral disorders: Secondary | ICD-10-CM | POA: Insufficient documentation

## 2014-03-25 DIAGNOSIS — R51 Headache: Secondary | ICD-10-CM | POA: Diagnosis not present

## 2014-03-25 DIAGNOSIS — Y9289 Other specified places as the place of occurrence of the external cause: Secondary | ICD-10-CM | POA: Insufficient documentation

## 2014-03-25 DIAGNOSIS — Y9389 Activity, other specified: Secondary | ICD-10-CM | POA: Insufficient documentation

## 2014-03-25 DIAGNOSIS — W01198A Fall on same level from slipping, tripping and stumbling with subsequent striking against other object, initial encounter: Secondary | ICD-10-CM | POA: Insufficient documentation

## 2014-03-25 DIAGNOSIS — S0101XA Laceration without foreign body of scalp, initial encounter: Secondary | ICD-10-CM | POA: Diagnosis not present

## 2014-03-25 MED ORDER — LIDOCAINE-EPINEPHRINE-TETRACAINE (LET) SOLUTION
3.0000 mL | Freq: Once | NASAL | Status: AC
  Administered 2014-03-25: 3 mL via TOPICAL
  Filled 2014-03-25: qty 3

## 2014-03-25 MED ORDER — ACETAMINOPHEN 325 MG PO TABS
650.0000 mg | ORAL_TABLET | Freq: Once | ORAL | Status: AC
  Administered 2014-03-25: 650 mg via ORAL
  Filled 2014-03-25: qty 2

## 2014-03-25 NOTE — ED Notes (Signed)
Patient states that she fell around 2100 last night during which she hit the left side of her face on a marble table Patient states that she is unsure of LOC or if she was "just stunned" after fall occurred Patent alert and oriented x 4 Neuro WNL Ecchymosis noted to left orbital and left zygomatic arch Laceration above left eye brow--bleeding controlled at this time Patient denies taking blood thinners Patient in NAD  Awaiting CT scan

## 2014-03-25 NOTE — ED Notes (Signed)
Patient back from CT Patient remains in NAD

## 2014-03-25 NOTE — ED Notes (Signed)
Patient transported to CT 

## 2014-03-25 NOTE — ED Provider Notes (Signed)
CSN: [id number]     Arrival date & time 03/25/14  0041 History   First MD Initiated Contact with Patient 03/25/14 0201     Chief Complaint  Patient presents with  . Head Laceration     (Consider location/radiation/quality/duration/timing/severity/associated sxs/prior Treatment) Patient is a 59 y.o. female presenting with scalp laceration. The history is provided by the patient. No language interpreter was used.  Head Laceration This is a new problem. The current episode started today. Associated symptoms include headaches. Pertinent negatives include no fever, nausea, neck pain or vomiting. Associated symptoms comments: She got up and tripped over objects in the floor, hitting her head on the floor and causing laceration to forehead. No LOC, nausea, vomiting. She denies visual changes, neck pain, other injury..    Past Medical History  Diagnosis Date  . Osteoporosis   . Depression    Past Surgical History  Procedure Laterality Date  . Enucleation Right 2013    eye infection  . Corneal transplant Right 2007    x4  . Rhinoplasty  1980  . Appendectomy  1974   Family History  Problem Relation Age of Onset  . Colon cancer Neg Hx    History  Substance Use Topics  . Smoking status: Never Smoker   . Smokeless tobacco: Never Used  . Alcohol Use: No   OB History    No data available     Review of Systems  Constitutional: Negative for fever.  Eyes: Negative for visual disturbance.  Gastrointestinal: Negative for nausea and vomiting.  Musculoskeletal: Negative for back pain and neck pain.  Skin: Positive for wound.  Neurological: Positive for headaches. Negative for syncope and light-headedness.      Allergies  Review of patient's allergies indicates no known allergies.  Home Medications   Prior to Admission medications   Medication Sig Start Date End Date Taking? Authorizing Provider  acetaminophen (TYLENOL) 325 MG tablet Take 1,300 mg by mouth every 6 (six) hours as  needed for moderate pain.   Yes Historical Provider, MD  calcium carbonate (OS-CAL) 600 MG TABS tablet Take 600 mg by mouth daily with breakfast.    Yes Historical Provider, MD  celecoxib (CELEBREX) 100 MG capsule Take 100 mg by mouth 2 (two) times daily.     Yes Historical Provider, MD  cholecalciferol (VITAMIN D) 1000 UNITS tablet Take 1,000 Units by mouth daily.   Yes Historical Provider, MD  mometasone (NASONEX) 50 MCG/ACT nasal spray Place 2 sprays into the nose daily as needed. Allergies   Yes Historical Provider, MD  valACYclovir (VALTREX) 1000 MG tablet Take 1,000 mg by mouth daily.    Yes Historical Provider, MD  MOVIPREP 100 G SOLR moviprep as directed. No substitutions Patient not taking: Reported on 03/25/2014 04/10/13   Milus Banister, MD   BP 131/86 mmHg  Pulse 78  Temp(Src) 97.6 F (36.4 C) (Oral)  Resp 18  Ht 5\' 2"  (1.575 m)  Wt 110 lb (49.896 kg)  BMI 20.11 kg/m2  SpO2 96% Physical Exam  Constitutional: She is oriented to person, place, and time. She appears well-developed and well-nourished. No distress.  Eyes:  Right eye artificial. Left eye PERRLA.  Neck: Normal range of motion. Neck supple.  Cardiovascular: Normal rate.   Pulmonary/Chest: Effort normal. She exhibits no tenderness.  Abdominal: There is no tenderness.  Musculoskeletal:  No midline or paracervical neck tenderness. Moves all extremities without pain or limitation.   Neurological: She is oriented to person, place, and time. Coordination  normal.  No facial asymmetry. No deficits of coordination. CN's 3-12 grossly intact. Ambulatory without ataxia.  Skin: Skin is warm and dry.  2 cm linear laceration left forehead with mild hematoma.     ED Course  Procedures (including critical care time) Labs Review Labs Reviewed - No data to display  Imaging Review Ct Head Wo Contrast  03/25/2014   CLINICAL DATA:  Patient fell around 2100 hr last night, striking the left side of the face on table. Not sure of  loss of consciousness. Bruising noted to the left orbital and left zygomatic arch region. Laceration above the left eyebrow.  EXAM: CT HEAD WITHOUT CONTRAST  TECHNIQUE: Contiguous axial images were obtained from the base of the skull through the vertex without intravenous contrast.  COMPARISON:  None.  FINDINGS: Ventricles and sulci appear symmetrical. No ventricular dilatation. No mass effect or midline shift. No abnormal extra-axial fluid collections. Gray-white matter junctions are distinct. Basal cisterns are not effaced. No evidence of acute intracranial hemorrhage. No depressed skull fractures. Visualized paranasal sinuses and mastoid air cells are not opacified.  IMPRESSION: No acute intracranial abnormalities.   Electronically Signed   By: Lucienne Capers M.D.   On: 03/25/2014 01:49     EKG Interpretation None     LACERATION REPAIR Performed by: Charlann Lange A Authorized by: Charlann Lange A Consent: Verbal consent obtained. Risks and benefits: risks, benefits and alternatives were discussed Consent given by: patient Patient identity confirmed: provided demographic data Prepped and Draped in normal sterile fashion Wound explored  Laceration Location: forehead  Laceration Length: 2cm  No Foreign Bodies seen or palpated  Anesthesia: local infiltration  Local anesthetic: L.E.T. Topically with adequate anesthesia  Anesthetic total: NA  Irrigation method: syringe Amount of cleaning: standard  Skin closure: 6-0 prolene  Number of sutures: 5  Technique: running  Patient tolerance: Patient tolerated the procedure well with no immediate complications.  MDM   Final diagnoses:  None    1. Facial laceration  No evidence to suggest intracranial injury. Negative CT head, normal nonfocal neurologic exam that remains unchanged throughout duration of ED visit. She is felt stable for discharge home.    Dewaine Oats, PA-C 03/25/14 2831  Everlene Balls, MD 03/25/14  [phone]

## 2014-03-25 NOTE — ED Notes (Signed)
Pt arrived to the ED with a complaint of a head laceration to the left upper forehead.  Pt states she got caught up in a cord and slipped and hit her head on a marble table.  Pt denies LOC.  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  Pt states she iced her head and the swelling went down.  Pt is com planing of pain on the left center head as well

## 2014-03-25 NOTE — Discharge Instructions (Signed)
Facial Laceration  A facial laceration is a cut on the face. These injuries can be painful and cause bleeding. Lacerations usually heal quickly, but they need special care to reduce scarring. DIAGNOSIS  Your health care provider will take a medical history, ask for details about how the injury occurred, and examine the wound to determine how deep the cut is. TREATMENT  Some facial lacerations may not require closure. Others may not be able to be closed because of an increased risk of infection. The risk of infection and the chance for successful closure will depend on various factors, including the amount of time since the injury occurred. The wound may be cleaned to help prevent infection. If closure is appropriate, pain medicines may be given if needed. Your health care provider will use stitches (sutures), wound glue (adhesive), or skin adhesive strips to repair the laceration. These tools bring the skin edges together to allow for faster healing and a better cosmetic outcome. If needed, you may also be given a tetanus shot. HOME CARE INSTRUCTIONS  Only take over-the-counter or prescription medicines as directed by your health care provider.  Follow your health care provider's instructions for wound care. These instructions will vary depending on the technique used for closing the wound. For Sutures:  Keep the wound clean and dry.   If you were given a bandage (dressing), you should change it at least once a day. Also change the dressing if it becomes wet or dirty, or as directed by your health care provider.   Wash the wound with soap and water 2 times a day. Rinse the wound off with water to remove all soap. Pat the wound dry with a clean towel.   After cleaning, apply a thin layer of the antibiotic ointment recommended by your health care provider. This will help prevent infection and keep the dressing from sticking.   You may shower as usual after the first 24 hours. Do not soak the  wound in water until the sutures are removed.   Get your sutures removed as directed by your health care provider. With facial lacerations, sutures should usually be taken out after 4-5 days to avoid stitch marks.   Wait a few days after your sutures are removed before applying any makeup. For Skin Adhesive Strips:  Keep the wound clean and dry.   Do not get the skin adhesive strips wet. You may bathe carefully, using caution to keep the wound dry.   If the wound gets wet, pat it dry with a clean towel.   Skin adhesive strips will fall off on their own. You may trim the strips as the wound heals. Do not remove skin adhesive strips that are still stuck to the wound. They will fall off in time.  For Wound Adhesive:  You may briefly wet your wound in the shower or bath. Do not soak or scrub the wound. Do not swim. Avoid periods of heavy sweating until the skin adhesive has fallen off on its own. After showering or bathing, gently pat the wound dry with a clean towel.   Do not apply liquid medicine, cream medicine, ointment medicine, or makeup to your wound while the skin adhesive is in place. This may loosen the film before your wound is healed.   If a dressing is placed over the wound, be careful not to apply tape directly over the skin adhesive. This may cause the adhesive to be pulled off before the wound is healed.   Avoid   prolonged exposure to sunlight or tanning lamps while the skin adhesive is in place.  The skin adhesive will usually remain in place for 5-10 days, then naturally fall off the skin. Do not pick at the adhesive film.  After Healing: Once the wound has healed, cover the wound with sunscreen during the day for 1 full year. This can help minimize scarring. Exposure to ultraviolet light in the first year will darken the scar. It can take 1-2 years for the scar to lose its redness and to heal completely.  SEEK IMMEDIATE MEDICAL CARE IF:  You have redness, pain, or  swelling around the wound.   You see ayellowish-white fluid (pus) coming from the wound.   You have chills or a fever.  MAKE SURE YOU:  Understand these instructions.  Will watch your condition.  Will get help right away if you are not doing well or get worse. Document Released: 02/20/2004 Document Revised: 11/02/2012 Document Reviewed: 08/25/2012 ExitCare Patient Information 2015 [employer]. This information is not intended to replace advice given to you by your health care provider. Make sure you discuss any questions you have with your health care provider.  

## 2014-04-13 ENCOUNTER — Other Ambulatory Visit: Payer: Self-pay

## 2014-04-13 DIAGNOSIS — Z1239 Encounter for other screening for malignant neoplasm of breast: Secondary | ICD-10-CM

## 2014-05-17 ENCOUNTER — Other Ambulatory Visit (HOSPITAL_COMMUNITY): Payer: Self-pay | Admitting: Gastroenterology

## 2014-05-17 DIAGNOSIS — B182 Chronic viral hepatitis C: Secondary | ICD-10-CM

## 2014-05-25 ENCOUNTER — Ambulatory Visit: Payer: Self-pay

## 2014-06-07 ENCOUNTER — Ambulatory Visit (HOSPITAL_COMMUNITY): Payer: 59

## 2014-06-12 ENCOUNTER — Telehealth (HOSPITAL_COMMUNITY): Payer: Self-pay

## 2014-06-12 NOTE — Telephone Encounter (Signed)
Called pt to reschedule appt on 06/26/14 at 7am.. Left a message to call ultrasound back. AW

## 2014-06-26 ENCOUNTER — Ambulatory Visit (HOSPITAL_COMMUNITY): Admission: RE | Admit: 2014-06-26 | Payer: 59 | Source: Ambulatory Visit

## 2014-06-29 ENCOUNTER — Ambulatory Visit
Admission: RE | Admit: 2014-06-29 | Discharge: 2014-06-29 | Disposition: A | Discharge status: Home / Self Care | Payer: 59 | Source: Ambulatory Visit

## 2014-06-29 DIAGNOSIS — Z1239 Encounter for other screening for malignant neoplasm of breast: Secondary | ICD-10-CM

## 2014-07-02 ENCOUNTER — Telehealth (HOSPITAL_COMMUNITY): Payer: Self-pay

## 2014-07-02 ENCOUNTER — Encounter: Payer: Self-pay | Admitting: Pharmacist Clinician (PhC)/ Clinical Pharmacy Specialist

## 2014-07-02 ENCOUNTER — Other Ambulatory Visit: Payer: Self-pay | Admitting: Pharmacist Clinician (PhC)/ Clinical Pharmacy Specialist

## 2014-07-02 MED ORDER — RIBAVIRIN 200 MG PO CAPS: ORAL_CAPSULE | ORAL | Status: DC

## 2014-07-02 MED ORDER — LEDIPASVIR-SOFOSBUVIR 90-400 MG PO TABS: 1.0000 | ORAL_TABLET | Freq: Every day | ORAL | Status: DC

## 2014-07-02 NOTE — Progress Notes (Signed)
Patient ID: [name], female   DOB: 10-14-1955, 59 y.o.   MRN: [id number] HPI: [name] is a 59 y.o. female who is referred here for evaluation of her hep C.   Lab Results  Component Value Date   HCVGENOTYPE  07/06/2008    1a (NOTE)  The performance characteristics of this test were validated by [facility] ([facility]). The [place] FDA has not approved this test. This test is used for clinical purposes and is not considered investigational or for research purposes. The  results are not intended to be used as the sole means for clinical diagnosis or patient management decisions. [facility] is authorized under CLIA to perform high complexity testing.  Test Methodology: Siemens VERSANT HCV Genotype 2.0 Line Probe Assay (LiPA)    Allergies: No Known Allergies  Vitals:    Past Medical History: Past Medical History  Diagnosis Date  . Osteoporosis   . Depression     Social History: History   Social History  . Marital Status: Single    Spouse Name: N/A  . Number of Children: N/A  . Years of Education: N/A   Social History Main Topics  . Smoking status: Never Smoker   . Smokeless tobacco: Never Used  . Alcohol Use: No  . Drug Use: No  . Sexual Activity: Not on file   Other Topics Concern  . Not on file   Social History Narrative    Labs: HEPATITIS B SURFACE AG (no units)  Date Value  07/04/2008 NEGATIVE   HCV AB (no units)  Date Value  07/04/2008 *   Reactive (NOTE) Result repeated and verified. This test is for screening purposes only.  Reactive results should be confirmed by an alternative method.  Suggest HCV RIBA, test code 83140.  Specimens will be held up to 7 days after collection.    Lab Results  Component Value Date   HCVGENOTYPE  07/06/2008    1a (NOTE)  The performance characteristics of this test were validated by [facility] ([facility]). The [place] FDA has not approved this test. This test is used for clinical purposes and is not  considered investigational or for research purposes. The  results are not intended to be used as the sole means for clinical diagnosis or patient management decisions. [facility] is authorized under CLIA to perform high complexity testing.  Test Methodology: Siemens VERSANT HCV Genotype 2.0 Line Probe Assay (LiPA)    Hepatitis C RNA quantitative Latest Ref Rng 07/06/2008  HCV Quantitative <43 IU/mL 1090000(H)  HCV Quantitative Log <1.63 log 10 6.04 ...(H)    AST (U/L)  Date Value  07/05/2008 20  07/04/2008 22  06/28/2008 37   ALT (U/L)  Date Value  07/05/2008 13  07/04/2008 15  06/28/2008 14   INR (no units)  Date Value  06/25/2008 1.7*  06/25/2008 1.4    CrCl: CrCl cannot be calculated (Unknown ideal weight.).  Fibrosis Score: F3 on biopsy  Child-Pugh Score:   Previous Treatment Regimen: PEG/riba  Assessment: 59 yo who was previously seen by [facility] hepatology for her Hep C. She was previously put on Peg/riba in 2006 but relapsed. She has F3 on biopsy in 2004. She was recently seen by Dr. Patsy Baltimore and Bayard Males and riba was ordered since she is a treatment experienced. She has been scheduled for an elastography on 6/7 and here to see Dr. Linus Salmons on 6/10 so she could get her meds.   Recommendations:  Harvoni 1 PO qday Ribavirin 600mg   PO qAM, 400mg  qPM She can follow up with [place] here or [employer], [place], [place].D., BCPS, AAHIVP [facility] 07/02/2014, 2:23 PM

## 2014-07-03 ENCOUNTER — Ambulatory Visit (HOSPITAL_COMMUNITY)
Admission: RE | Admit: 2014-07-03 | Discharge: 2014-07-03 | Disposition: A | Discharge status: Home / Self Care | Payer: 59 | Source: Ambulatory Visit | Attending: Gastroenterology | Admitting: Gastroenterology

## 2014-07-03 DIAGNOSIS — R932 Abnormal findings on diagnostic imaging of liver and biliary tract: Secondary | ICD-10-CM | POA: Diagnosis not present

## 2014-07-03 DIAGNOSIS — B182 Chronic viral hepatitis C: Secondary | ICD-10-CM | POA: Diagnosis not present

## 2014-07-06 ENCOUNTER — Ambulatory Visit (INDEPENDENT_AMBULATORY_CARE_PROVIDER_SITE_OTHER): Payer: 59 | Admitting: Internal Medicine

## 2014-07-06 ENCOUNTER — Encounter: Payer: Self-pay | Admitting: Internal Medicine

## 2014-07-06 VITALS — BP 119/81 | HR 76 | Temp 98.1°F | Ht 62.0 in | Wt 112.0 lb

## 2014-07-06 DIAGNOSIS — B182 Chronic viral hepatitis C: Secondary | ICD-10-CM | POA: Diagnosis not present

## 2014-07-06 MED ORDER — LEDIPASVIR-SOFOSBUVIR 90-400 MG PO TABS: 1.0000 | ORAL_TABLET | Freq: Every day | ORAL | Status: DC

## 2014-07-06 MED ORDER — RIBAVIRIN 200 MG PO CAPS: ORAL_CAPSULE | ORAL | Status: DC

## 2014-07-06 NOTE — Patient Instructions (Signed)
Date 07/06/2014  Dear Ms [name], As discussed in the [facility], your hepatitis C therapy will include the following medications:          Harvoni 90mg /400mg  tablet:           Take 1 tablet by mouth once daily   Please note that ALL MEDICATIONS WILL START ON THE SAME DATE for a total of 12 weeks. ---------------------------------------------------------------- Your HCV Treatment Start Date: TBA   Your HCV genotype:  1    Liver Fibrosis: F2-3    ---------------------------------------------------------------- YOUR PHARMACY CONTACT:   [name] Level of [facility] and [place] Phone: [phone] Hours: Monday to Friday 7:30 am to 6:00 pm   Please always contact your pharmacy at least 3-4 business days before you run out of medications to ensure your next month's medication is ready or 1 week prior to running out if you receive it by mail.  Remember, each prescription is for 28 days. ---------------------------------------------------------------- GENERAL NOTES REGARDING YOUR HEPATITIS C MEDICATION:  SOFOSBUVIR/LEDIPASVIR (HARVONI): - Harvoni tablet is taken daily with OR without food. - The tablets are orange. - The tablets should be stored at room temperature.  - Acid reducing agents such as H2 blockers (ie. Pepcid (famotidine), Zantac (ranitidine), Tagamet (cimetidine), Axid (nizatidine) and proton pump inhibitors (ie. Prilosec (omeprazole), Protonix (pantoprazole), Nexium (esomeprazole), or Aciphex (rabeprazole)) can decrease effectiveness of Harvoni. Do not take until you have discussed with a health care provider.    -Antacids that contain magnesium and/or aluminum hydroxide (ie. Milk of Magensia, Rolaids, Gaviscon, Maalox, Mylanta, an dArthritis Pain Formula)can reduce absorption of Harvoni, so take them at least 4 hours before or after Harvoni.  -Calcium carbonate (calcium supplements or antacids such as Tums, Caltrate,  Os-Cal)needs to be taken at least 4 hours hours before or after Harvoni.  -St. John's wort or any products that contain St. John's wort like some herbal supplements  Please inform the office prior to starting any of these medications.  - The common side effects with Harvoni:      1. Fatigue      2. Headache      3. Nausea      4. Diarrhea      5. Insomnia   Support Path is a suite of resources designed to help patients start with HARVONI and move toward treatment completion [place] helps patients access therapy and get off to an efficient start  Benefits investigation and prior authorization support Co-pay and other financial assistance A specialty pharmacy finder CO-PAY COUPON The [place] co-pay coupon may help eligible patients Dunn their out-of-pocket costs. With a co-pay coupon, most eligible patients may pay no more than $5 per co-pay (restrictions apply) www.harvoni.com call 984-064-7694 Not valid for patients enrolled in government healthcare prescription drug programs, such as Medicare Part D and Medicaid. Patients in the coverage gap known as the "donut hole" also are not eligible The HARVONI co-pay coupon program will cover the out-of-pocket costs for HARVONI prescriptions up to a maximum of 25% of the catalog price of a 12-week regimen of HARVONI  Please note that this only lists the most common side effects and is NOT a comprehensive list of the potential side effects of these medications. For more information, please review the drug information sheets that come with your medication package from the pharmacy.  ---------------------------------------------------------------- GENERAL HELPFUL HINTS ON HCV THERAPY: 1. No alcohol. 2. Protect against sun-sensitivity/sunburns (wear sunglasses, hat, long sleeves, pants  and sunscreen). 3. Stay well-hydrated/well-moisturized. 4. Notify the [facility] of any changes in your other over-the-counter/herbal or  prescription medications. 5. If you miss a dose of your medication, take the missed dose as soon as you remember. Return to your regular time/dose schedule the next day.  6.  Do not stop taking your medications without first talking with your healthcare provider. 7.  You may take Tylenol (acetaminophen), as long as the dose is less than 2000 mg (OR no more than 4 tablets of the Tylenol Extra Strengths '500mg'$  tablet) in 24 hours. 8.  You will need to obtain routine labs and/or office visits at [facility] at weeks 4 and 12 as well as 12 and 24 weeks after completion of treatment.   [name], [name] for [name] [name] [name], [name]  [phone]

## 2014-07-06 NOTE — Progress Notes (Signed)
+[name] is a 59 y.o. female who presents for initial evaluation and management of a positive Hepatitis C antibody test.  Patient tested positive about 10 years ago. Hepatitis C risk factors present are: IV drug abuse (details: IV heroin in 1994). Patient denies multiple sexual partners, renal dialysis, sexual contact with person with liver disease, tattoos. Patient has had other studies performed. Results: liver biopsy, result: was F3 about 10 years ago, elastography F2-3, hepatitis C RNA by PCR, result: positive. Patient has had prior treatment for Hepatitis C. Patient does not have a past history of liver disease. Patient does not have a family history of liver disease.   HPI: She was treated in 2003 with 48 weeks of Peg/riba and relapsed after treatment.    Patient does have documented immunity to Hepatitis A. Patient does have documented immunity to Hepatitis B.     Review of Systems A comprehensive review of systems was negative.   Past Medical History  Diagnosis Date  . Osteoporosis   . Depression     Prior to Admission medications   Medication Sig Start Date End Date Taking? Authorizing Provider  acetaminophen (TYLENOL) 325 MG tablet Take 1,300 mg by mouth every 6 (six) hours as needed for moderate pain.   Yes Historical Provider, MD  alendronate (FOSAMAX) 70 MG tablet Take 70 mg by mouth once a week. Take with a full glass of water on an empty stomach.   Yes Historical Provider, MD  calcium carbonate (OS-CAL) 600 MG TABS tablet Take 600 mg by mouth daily with breakfast.    Yes Historical Provider, MD  celecoxib (CELEBREX) 100 MG capsule Take 100 mg by mouth 2 (two) times daily.     Yes Historical Provider, MD  cholecalciferol (VITAMIN D) 1000 UNITS tablet Take 1,000 Units by mouth daily.   Yes Historical Provider, MD  mometasone (NASONEX) 50 MCG/ACT nasal spray Place 2 sprays into the nose daily as needed. Allergies   Yes Historical Provider, MD  valACYclovir (VALTREX) 1000  MG tablet Take 1,000 mg by mouth daily.    Yes Historical Provider, MD  Ledipasvir-Sofosbuvir (HARVONI) 90-400 MG TABS Take 1 tablet by mouth daily. 07/06/14   Thayer Headings, MD  ribavirin (REBETOL) 200 MG capsule Take 600mg  in AM and 400mg  PM 07/06/14   Thayer Headings, MD    No Known Allergies  History  Substance Use Topics  . Smoking status: Never Smoker   . Smokeless tobacco: Never Used  . Alcohol Use: No    Family History  Problem Relation Age of Onset  . Colon cancer Neg Hx       Objective:   Filed Vitals:   07/06/14 1144  BP: 119/81  Pulse: 76  Temp: 98.1 F (36.7 C)   in no apparent distress and alert HEENT: anicteric Cor RRR and No murmurs clear Bowel sounds are normal, liver is not enlarged, spleen is not enlarged peripheral pulses normal, no pedal edema, no clubbing or cyanosis negative for - jaundice, spider hemangioma, telangiectasia, palmar erythema, ecchymosis and atrophy  Laboratory Genotype:  Lab Results  Component Value Date   HCVGENOTYPE  07/06/2008    1a (NOTE)  The performance characteristics of this test were validated by [facility] ([facility]). The [place] FDA has not approved this test. This test is used for clinical purposes and is not considered investigational or for research purposes. The  results are not intended to be used as the sole means for clinical diagnosis or patient  management decisions. [facility] is authorized under CLIA to perform high complexity testing.  Test Methodology: Siemens VERSANT HCV Genotype 2.0 Line Probe Assay (LiPA)   HCV viral load:  Lab Results  Component Value Date   HCVQUANT 1090000* 07/06/2008   Lab Results  Component Value Date   WBC 16.5* 07/07/2008   HGB 10.1* 07/07/2008   HCT 29.8* 07/07/2008   MCV 100.7* 07/07/2008   PLT 353 07/07/2008    Lab Results  Component Value Date   CREATININE 0.60 07/06/2008   BUN 8 07/06/2008   NA 136 07/06/2008   K 4.1 07/06/2008   CL 101 07/06/2008   CO2 26  07/06/2008    Lab Results  Component Value Date   ALT 13 07/05/2008   AST 20 07/05/2008   ALKPHOS 57 07/05/2008   BILITOT 0.5 07/05/2008   INR 1.7* 06/25/2008      Assessment: Chronic Hepatitis C genotype 1  Plan: 1) Patient counseled extensively on limiting acetaminophen to no more than 2 grams daily, avoidance of alcohol. 2) Transmission discussed with patient including sexual transmission, sharing razors and toothbrush.   3) Will need referral to gastroenterology if concern for cirrhosis 4) Will need referral for substance abuse counseling: No. 5) Will prescribe Harvoni with ribavirin for 12 weeks for a treatment experienced patient. 6) Hepatitis A vaccine No. 7) Hepatitis B vaccine No. 8) will follow up after starting medication, will schedule labs now for cbc in 2 weeks then cbc, cmp, viral load in 4 weeks

## 2014-07-18 ENCOUNTER — Other Ambulatory Visit: Payer: Self-pay

## 2014-07-24 ENCOUNTER — Other Ambulatory Visit: Payer: 59

## 2014-07-24 DIAGNOSIS — B182 Chronic viral hepatitis C: Secondary | ICD-10-CM

## 2014-07-24 LAB — COMPLETE METABOLIC PANEL WITH GFR
ALT: 14 U/L (ref 0–35)
AST: 28 U/L (ref 0–37)
Albumin: 4.2 g/dL (ref 3.5–5.2)
Alkaline Phosphatase: 36 U/L — ABNORMAL LOW (ref 39–117)
BUN: 11 mg/dL (ref 6–23)
CO2: 28 mEq/L (ref 19–32)
Calcium: 9.3 mg/dL (ref 8.4–10.5)
Chloride: 101 mEq/L (ref 96–112)
Creat: 0.75 mg/dL (ref 0.50–1.10)
GFR, Est African American: >89 mL/min
GFR, Est Non African American: 89 mL/min
Glucose, Bld: 76 mg/dL (ref 70–99)
Potassium: 4.9 mEq/L (ref 3.5–5.3)
Sodium: 140 mEq/L (ref 135–145)
Total Bilirubin: 1.6 mg/dL — ABNORMAL HIGH (ref 0.2–1.2)
Total Protein: 7.2 g/dL (ref 6.0–8.3)

## 2014-07-24 LAB — CBC WITH DIFFERENTIAL/PLATELET
Basophils Absolute: 0 10*3/uL (ref 0.0–0.1)
Basophils Relative: 0 % (ref 0–1)
Eosinophils Absolute: 0.1 10*3/uL (ref 0.0–0.7)
Eosinophils Relative: 1 % (ref 0–5)
HCT: 34.7 % — ABNORMAL LOW (ref 36.0–46.0)
Hemoglobin: 11.6 g/dL — ABNORMAL LOW (ref 12.0–15.0)
Lymphocytes Relative: 51 % — ABNORMAL HIGH (ref 12–46)
Lymphs Abs: 3.8 10*3/uL (ref 0.7–4.0)
MCH: 31.7 pg (ref 26.0–34.0)
MCHC: 33.4 g/dL (ref 30.0–36.0)
MCV: 94.8 fL (ref 78.0–100.0)
MPV: 10.4 fL (ref 8.6–12.4)
Monocytes Absolute: 0.3 10*3/uL (ref 0.1–1.0)
Monocytes Relative: 4 % (ref 3–12)
Neutro Abs: 3.3 10*3/uL (ref 1.7–7.7)
Neutrophils Relative %: 44 % (ref 43–77)
Platelets: 145 10*3/uL — ABNORMAL LOW (ref 150–400)
RBC: 3.66 MIL/uL — ABNORMAL LOW (ref 3.87–5.11)
RDW: 13.9 % (ref 11.5–15.5)
WBC: 7.5 10*3/uL (ref 4.0–10.5)

## 2014-07-25 LAB — HEPATITIS C RNA QUANTITATIVE
HCV Quantitative Log: <1.18 {Log} (ref <1.18)
HCV Quantitative: <15 IU/mL (ref <15)

## 2014-07-26 ENCOUNTER — Telehealth: Payer: Self-pay | Admitting: Licensed Clinical Social Worker

## 2014-07-26 NOTE — Telephone Encounter (Signed)
-----   Message from Thayer Headings, MD sent at 07/26/2014  1:22 PM EDT ----- Please let her know her labs look good with a now undetectable HCV viral load and her hgb is good.  She should get a repeat CBC 1 week prior to her appt Aug 2.  thanks

## 2014-07-26 NOTE — Telephone Encounter (Signed)
error 

## 2014-07-26 NOTE — Telephone Encounter (Signed)
Left message on the patient's voicemail to call and ask for appointment, also left results

## 2014-08-08 ENCOUNTER — Other Ambulatory Visit: Payer: Self-pay

## 2014-08-09 ENCOUNTER — Telehealth: Payer: Self-pay | Admitting: *Deleted

## 2014-08-09 NOTE — Telephone Encounter (Signed)
Patient called and advised she is taking Harvoni and Ribovirine for Hep C and that she recently had a dental procedure and was given Amoxicillian 500 mg to take. She advised she is having sever nausea and vomiting since starting the Amox and wants to know if she should take differently or at all. Asked Dr Linus Salmons and he advised her to stop taking the Amox if possible. If that is not possible she could take it at a different time and not with the harvoni and ribovirine. Advised the patient to call back if she has any further questions.

## 2014-08-22 ENCOUNTER — Other Ambulatory Visit: Payer: Self-pay

## 2014-08-23 ENCOUNTER — Other Ambulatory Visit: Payer: 59

## 2014-08-23 ENCOUNTER — Other Ambulatory Visit: Payer: Self-pay | Admitting: Internal Medicine

## 2014-08-23 DIAGNOSIS — B182 Chronic viral hepatitis C: Secondary | ICD-10-CM

## 2014-08-24 LAB — CBC WITH DIFFERENTIAL/PLATELET
Basophils Absolute: 0 10*3/uL (ref 0.0–0.1)
Basophils Relative: 0 % (ref 0–1)
Eosinophils Absolute: 0.1 10*3/uL (ref 0.0–0.7)
Eosinophils Relative: 1 % (ref 0–5)
HCT: 37.6 % (ref 36.0–46.0)
Hemoglobin: 11.9 g/dL — ABNORMAL LOW (ref 12.0–15.0)
Lymphocytes Relative: 51 % — ABNORMAL HIGH (ref 12–46)
Lymphs Abs: 2.7 10*3/uL (ref 0.7–4.0)
MCH: 32 pg (ref 26.0–34.0)
MCHC: 31.6 g/dL (ref 30.0–36.0)
MCV: 101.1 fL — ABNORMAL HIGH (ref 78.0–100.0)
MPV: 10.2 fL (ref 8.6–12.4)
Monocytes Absolute: 0.4 10*3/uL (ref 0.1–1.0)
Monocytes Relative: 7 % (ref 3–12)
Neutro Abs: 2.1 10*3/uL (ref 1.7–7.7)
Neutrophils Relative %: 41 % — ABNORMAL LOW (ref 43–77)
Platelets: 216 10*3/uL (ref 150–400)
RBC: 3.72 MIL/uL — ABNORMAL LOW (ref 3.87–5.11)
RDW: 13.9 % (ref 11.5–15.5)
WBC: 5.2 10*3/uL (ref 4.0–10.5)

## 2014-08-28 ENCOUNTER — Encounter: Payer: Self-pay | Admitting: Internal Medicine

## 2014-08-28 ENCOUNTER — Ambulatory Visit (INDEPENDENT_AMBULATORY_CARE_PROVIDER_SITE_OTHER): Payer: 59 | Admitting: Internal Medicine

## 2014-08-28 VITALS — BP 125/61 | HR 54 | Temp 97.8°F | Ht 62.5 in | Wt 110.0 lb

## 2014-08-28 DIAGNOSIS — B182 Chronic viral hepatitis C: Secondary | ICD-10-CM | POA: Diagnosis not present

## 2014-08-28 DIAGNOSIS — F1910 Other psychoactive substance abuse, uncomplicated: Secondary | ICD-10-CM | POA: Diagnosis not present

## 2014-08-28 DIAGNOSIS — F1911 Other psychoactive substance abuse, in remission: Secondary | ICD-10-CM | POA: Insufficient documentation

## 2014-08-28 NOTE — Assessment & Plan Note (Signed)
Doing well and labs now undetectable.  Hgb stable so no indication to dose reduce.

## 2014-08-28 NOTE — Progress Notes (Signed)
   Subjective:    Patient ID: [name], female    DOB: 03-16-1955, 59 y.o.   MRN: [id number]  HPI Here for follow up of HCV.  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  On methadone maintenance for drug history and is weaning off of it slowly.  Now has started Harvoni with ribavirin for 12 weeks.  Initial labs done by another provider and reviewed.  Had labs 6/28 and was undetectable for virus and Hgb stable at 11.6 and then rechecked and 11.9.  Some fatigue but actually feels better overall.  Pleased with results and medication, particularly compared to interferon.  No missed doses.    Review of Systems  Constitutional: Positive for fatigue. Negative for activity change and appetite change.  Gastrointestinal: Negative for nausea and diarrhea.  Skin: Negative for rash.  Neurological: Negative for dizziness, light-headedness and headaches.       Objective:   Physical Exam  Constitutional: She appears well-developed and well-nourished. No distress.  HENT:  Mouth/Throat: No oropharyngeal exudate.  Eyes: No scleral icterus.  Cardiovascular: Normal rate, regular rhythm and normal heart sounds.   No murmur heard. Pulmonary/Chest: Effort normal and breath sounds normal. No respiratory distress.  Lymphadenopathy:    She has no cervical adenopathy.  Skin: No rash noted.          Assessment & Plan:

## 2014-08-28 NOTE — Assessment & Plan Note (Signed)
Weaning methadone

## 2014-10-02 ENCOUNTER — Other Ambulatory Visit: Payer: Self-pay

## 2014-10-04 ENCOUNTER — Other Ambulatory Visit: Payer: 59

## 2014-10-04 DIAGNOSIS — B182 Chronic viral hepatitis C: Secondary | ICD-10-CM

## 2014-10-04 LAB — CBC WITH DIFFERENTIAL/PLATELET
Basophils Absolute: 0 10*3/uL (ref 0.0–0.1)
Basophils Relative: 0 % (ref 0–1)
Eosinophils Absolute: 0.1 10*3/uL (ref 0.0–0.7)
Eosinophils Relative: 2 % (ref 0–5)
HCT: 35.8 % — ABNORMAL LOW (ref 36.0–46.0)
Hemoglobin: 11.6 g/dL — ABNORMAL LOW (ref 12.0–15.0)
Lymphocytes Relative: 40 % (ref 12–46)
Lymphs Abs: 1.6 10*3/uL (ref 0.7–4.0)
MCH: 32.1 pg (ref 26.0–34.0)
MCHC: 32.4 g/dL (ref 30.0–36.0)
MCV: 99.2 fL (ref 78.0–100.0)
MPV: 10 fL (ref 8.6–12.4)
Monocytes Absolute: 0.3 10*3/uL (ref 0.1–1.0)
Monocytes Relative: 8 % (ref 3–12)
Neutro Abs: 2 10*3/uL (ref 1.7–7.7)
Neutrophils Relative %: 50 % (ref 43–77)
Platelets: 191 10*3/uL (ref 150–400)
RBC: 3.61 MIL/uL — ABNORMAL LOW (ref 3.87–5.11)
RDW: 13.8 % (ref 11.5–15.5)
WBC: 3.9 10*3/uL — ABNORMAL LOW (ref 4.0–10.5)

## 2014-10-07 LAB — HEPATITIS C RNA QUANTITATIVE: HCV Quantitative: NOT DETECTED IU/mL (ref <15)

## 2014-10-11 ENCOUNTER — Ambulatory Visit (INDEPENDENT_AMBULATORY_CARE_PROVIDER_SITE_OTHER): Payer: 59 | Admitting: Internal Medicine

## 2014-10-11 ENCOUNTER — Encounter: Payer: Self-pay | Admitting: Internal Medicine

## 2014-10-11 VITALS — BP 101/66 | HR 69 | Temp 97.5°F | Ht 62.5 in | Wt 102.0 lb

## 2014-10-11 DIAGNOSIS — Z23 Encounter for immunization: Secondary | ICD-10-CM

## 2014-10-11 DIAGNOSIS — D6489 Other specified anemias: Secondary | ICD-10-CM | POA: Diagnosis not present

## 2014-10-11 DIAGNOSIS — B182 Chronic viral hepatitis C: Secondary | ICD-10-CM

## 2014-10-11 NOTE — Assessment & Plan Note (Signed)
Stable anemia from ribavirin.  Encouraged to eat iron, MVi.  Should increase now off of medications.

## 2014-10-11 NOTE — Assessment & Plan Note (Signed)
Doing great, undetectable after treatment.  rtc 3 months with lab before.

## 2014-10-11 NOTE — Progress Notes (Signed)
   Subjective:    Patient ID: [name], female    DOB: Oct 03, 1955, 59 y.o.   MRN: [id number]  HPI Here for follow up of HCV.  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  On methadone maintenance for drug history and is weaning off of it slowly.  Now has started Harvoni with ribavirin for 12 weeks.  Initial labs done by another provider and reviewed.  Had labs 6/28 and was undetectable for virus and Hgb stable at 11.6 and then rechecked and 11.9.  Some fatigue but actually feels better overall.  Now completed 12 weeks of Harvoni with ribavirin and tolerated well.     Review of Systems  Constitutional: Positive for fatigue. Negative for activity change and appetite change.  Gastrointestinal: Negative for nausea and diarrhea.  Skin: Negative for rash.  Neurological: Negative for dizziness, light-headedness and headaches.       Objective:   Physical Exam  Constitutional: She appears well-developed and well-nourished. No distress.  HENT:  Mouth/Throat: No oropharyngeal exudate.  Eyes: No scleral icterus.  Cardiovascular: Normal rate, regular rhythm and normal heart sounds.   No murmur heard. Pulmonary/Chest: Effort normal and breath sounds normal. No respiratory distress.  Lymphadenopathy:    She has no cervical adenopathy.  Skin: No rash noted.          Assessment & Plan:

## 2014-10-11 NOTE — Addendum Note (Signed)
Addended by: Thayer Headings on: 10/11/2014 04:53 PM   Modules accepted: Level of Service

## 2014-11-16 ENCOUNTER — Other Ambulatory Visit (HOSPITAL_COMMUNITY): Payer: Self-pay | Admitting: Orthopedic Surgery

## 2014-11-16 DIAGNOSIS — M4692 Unspecified inflammatory spondylopathy, cervical region: Secondary | ICD-10-CM

## 2014-11-16 DIAGNOSIS — M4722 Other spondylosis with radiculopathy, cervical region: Secondary | ICD-10-CM

## 2014-11-16 DIAGNOSIS — M5412 Radiculopathy, cervical region: Principal | ICD-10-CM

## 2014-11-26 ENCOUNTER — Ambulatory Visit (HOSPITAL_COMMUNITY)
Admission: RE | Admit: 2014-11-26 | Discharge: 2014-11-26 | Disposition: A | Discharge status: Home / Self Care | Payer: 59 | Source: Ambulatory Visit | Attending: Orthopedic Surgery | Admitting: Orthopedic Surgery

## 2014-11-26 DIAGNOSIS — M5031 Other cervical disc degeneration,  high cervical region: Secondary | ICD-10-CM | POA: Diagnosis not present

## 2014-11-26 DIAGNOSIS — M5023 Other cervical disc displacement, cervicothoracic region: Secondary | ICD-10-CM | POA: Diagnosis not present

## 2014-11-26 DIAGNOSIS — M4722 Other spondylosis with radiculopathy, cervical region: Secondary | ICD-10-CM | POA: Insufficient documentation

## 2014-11-26 DIAGNOSIS — M79601 Pain in right arm: Secondary | ICD-10-CM | POA: Insufficient documentation

## 2014-11-26 DIAGNOSIS — M5412 Radiculopathy, cervical region: Secondary | ICD-10-CM

## 2014-11-26 DIAGNOSIS — M4692 Unspecified inflammatory spondylopathy, cervical region: Secondary | ICD-10-CM

## 2014-11-29 ENCOUNTER — Ambulatory Visit (HOSPITAL_COMMUNITY): Payer: 59

## 2014-11-30 ENCOUNTER — Other Ambulatory Visit: Payer: Self-pay | Admitting: Orthopedic Surgery

## 2014-12-07 ENCOUNTER — Encounter (HOSPITAL_COMMUNITY)
Admission: RE | Admit: 2014-12-07 | Discharge: 2014-12-07 | Disposition: A | Discharge status: Home / Self Care | Payer: 59 | Source: Ambulatory Visit | Attending: Orthopedic Surgery | Admitting: Orthopedic Surgery

## 2014-12-07 ENCOUNTER — Encounter (HOSPITAL_COMMUNITY): Payer: Self-pay

## 2014-12-07 ENCOUNTER — Ambulatory Visit (HOSPITAL_COMMUNITY)
Admission: RE | Admit: 2014-12-07 | Discharge: 2014-12-07 | Disposition: A | Discharge status: Home / Self Care | Payer: 59 | Source: Ambulatory Visit | Attending: Orthopedic Surgery | Admitting: Orthopedic Surgery

## 2014-12-07 DIAGNOSIS — Z8701 Personal history of pneumonia (recurrent): Secondary | ICD-10-CM | POA: Diagnosis not present

## 2014-12-07 DIAGNOSIS — Z01810 Encounter for preprocedural cardiovascular examination: Secondary | ICD-10-CM | POA: Diagnosis not present

## 2014-12-07 DIAGNOSIS — Z01818 Encounter for other preprocedural examination: Secondary | ICD-10-CM | POA: Diagnosis not present

## 2014-12-07 DIAGNOSIS — Z01812 Encounter for preprocedural laboratory examination: Secondary | ICD-10-CM | POA: Diagnosis not present

## 2014-12-07 DIAGNOSIS — R9431 Abnormal electrocardiogram [ECG] [EKG]: Secondary | ICD-10-CM | POA: Insufficient documentation

## 2014-12-07 HISTORY — DX: Unspecified osteoarthritis, unspecified site: M19.90

## 2014-12-07 HISTORY — DX: Presence of artificial eye: Z97.0

## 2014-12-07 HISTORY — DX: Headache, unspecified: R51.9

## 2014-12-07 HISTORY — DX: Headache: R51

## 2014-12-07 LAB — CBC WITH DIFFERENTIAL/PLATELET
Basophils Absolute: 0 10*3/uL (ref 0.0–0.1)
Basophils Relative: 0 %
Eosinophils Absolute: 0.1 10*3/uL (ref 0.0–0.7)
Eosinophils Relative: 1 %
HCT: 44 % (ref 36.0–46.0)
Hemoglobin: 14.6 g/dL (ref 12.0–15.0)
Lymphocytes Relative: 66 %
Lymphs Abs: 3.9 10*3/uL (ref 0.7–4.0)
MCH: 31.5 pg (ref 26.0–34.0)
MCHC: 33.2 g/dL (ref 30.0–36.0)
MCV: 94.8 fL (ref 78.0–100.0)
Monocytes Absolute: 0.6 10*3/uL (ref 0.1–1.0)
Monocytes Relative: 9 %
Neutro Abs: 1.4 10*3/uL — ABNORMAL LOW (ref 1.7–7.7)
Neutrophils Relative %: 24 %
Platelets: 137 10*3/uL — ABNORMAL LOW (ref 150–400)
RBC: 4.64 MIL/uL (ref 3.87–5.11)
RDW: 12.4 % (ref 11.5–15.5)
WBC: 5.9 10*3/uL (ref 4.0–10.5)

## 2014-12-07 LAB — COMPREHENSIVE METABOLIC PANEL
ALT: 18 U/L (ref 14–54)
AST: 27 U/L (ref 15–41)
Albumin: 4.3 g/dL (ref 3.5–5.0)
Alkaline Phosphatase: 39 U/L (ref 38–126)
Anion gap: 8 (ref 5–15)
BUN: 11 mg/dL (ref 6–20)
CO2: 30 mmol/L (ref 22–32)
Calcium: 9.3 mg/dL (ref 8.9–10.3)
Chloride: 102 mmol/L (ref 101–111)
Creatinine, Ser: 0.81 mg/dL (ref 0.44–1.00)
GFR calc Af Amer: >60 mL/min (ref >60)
GFR calc non Af Amer: >60 mL/min (ref >60)
Glucose, Bld: 97 mg/dL (ref 65–99)
Potassium: 4.4 mmol/L (ref 3.5–5.1)
Sodium: 140 mmol/L (ref 135–145)
Total Bilirubin: 0.5 mg/dL (ref 0.3–1.2)
Total Protein: 7.8 g/dL (ref 6.5–8.1)

## 2014-12-07 LAB — SURGICAL PCR SCREEN
MRSA, PCR: NEGATIVE
Staphylococcus aureus: POSITIVE — AB

## 2014-12-07 LAB — URINALYSIS, ROUTINE W REFLEX MICROSCOPIC
Bilirubin Urine: NEGATIVE
Glucose, UA: NEGATIVE mg/dL
Hgb urine dipstick: NEGATIVE
Ketones, ur: NEGATIVE mg/dL
Leukocytes, UA: NEGATIVE
Nitrite: NEGATIVE
Protein, ur: NEGATIVE mg/dL
Specific Gravity, Urine: 1.006 (ref 1.005–1.030)
Urobilinogen, UA: 0.2 mg/dL (ref 0.0–1.0)
pH: 8 (ref 5.0–8.0)

## 2014-12-07 LAB — TYPE AND SCREEN
ABO/RH(D): A NEG
Antibody Screen: NEGATIVE

## 2014-12-07 LAB — PROTIME-INR
INR: 1.08 (ref 0.00–1.49)
Prothrombin Time: 14.2 seconds (ref 11.6–15.2)

## 2014-12-07 LAB — APTT: aPTT: 24 seconds (ref 24–37)

## 2014-12-07 NOTE — Pre-Procedure Instructions (Signed)
[name]  12/07/2014      [facility] - [place], [place] - [address] [address] Phone: [phone] Fax: [phone]  [place], [place] - [address] [place] [address] Phone: [phone] Fax: [phone]    Your procedure is scheduled on Wed, Nov 16 @ 1:00 PM  Report to [facility] at 10:00 AM  Call this number if you have problems the morning of surgery:  [phone]   Remember:  Do not eat food or drink liquids after midnight.  Take these medicines the morning of surgery with A SIP OF WATER Pain Pill(if needed),Nasonex(Mometasone-if needed),and Valacyclovir(Valtrex)              Stop taking your Vitamins along with any Herbal Medications. No Goody's,BC's,Aleve,Aspirin,Advil,Motrin,or Fish Oil.   Do not wear jewelry, make-up or nail polish.  Do not wear lotions, powders, or perfumes.  You may wear deodorant.  Do not shave 48 hours prior to surgery.    Do not bring valuables to the hospital.  [facility] is not responsible for any belongings or valuables.  Contacts, dentures or bridgework may not be worn into surgery.  Leave your suitcase in the car.  After surgery it may be brought to your room.  For patients admitted to the hospital, discharge time will be determined by your treatment team.  Patients discharged the day of surgery will not be allowed to drive home.    Special instructions:   [facility] - Preparing for Surgery  Before surgery, you can play an important role.  Because skin is not sterile, your skin needs to be as free of germs as possible.  You can reduce the number of germs on you skin by washing with CHG (chlorahexidine gluconate) soap before surgery.  CHG is an antiseptic cleaner which kills germs and bonds with the skin to continue killing germs even after washing.  Please DO NOT use if  you have an allergy to CHG or antibacterial soaps.  If your skin becomes reddened/irritated stop using the CHG and inform your nurse when you arrive at Short Stay.  Do not shave (including legs and underarms) for at least 48 hours prior to the first CHG shower.  You may shave your face.  Please follow these instructions carefully:   1.  Shower with CHG Soap the night before surgery and the                                morning of Surgery.  2.  If you choose to wash your hair, wash your hair first as usual with your       normal shampoo.  3.  After you shampoo, rinse your hair and body thoroughly to remove the                      Shampoo.  4.  Use CHG as you would any other liquid soap.  You can apply chg directly       to the skin and wash gently with scrungie or a clean washcloth.  5.  Apply the CHG Soap to your body ONLY FROM THE NECK DOWN.        Do not use on open wounds or open sores.  Avoid contact with your eyes,  ears, mouth and genitals (private parts).  Wash genitals (private parts)       with your normal soap.  6.  Wash thoroughly, paying special attention to the area where your surgery        will be performed.  7.  Thoroughly rinse your body with warm water from the neck down.  8.  DO NOT shower/wash with your normal soap after using and rinsing off       the CHG Soap.  9.  Pat yourself dry with a clean towel.            10.  Wear clean pajamas.            11.  Place clean sheets on your bed the night of your first shower and do not        sleep with pets.  Day of Surgery  Do not apply any lotions/deoderants the morning of surgery.  Please wear clean clothes to the hospital/surgery center.   Please read over the following fact sheets that you were given. Pain Booklet, Coughing and Deep Breathing, MRSA Information and Surgical Site Infection Prevention

## 2014-12-11 NOTE — H&P (Signed)
PREOPERATIVE H&P  Chief Complaint: R arm pain  HPI: [name] is a 59 y.o. female who presents with ongoing pain in the R arm x 6 weeks  MRI reveals NF stenosis from C5-6 to C7-T1  Patient has failed multiple forms of conservative care and continues to have pain (see office notes for additional details regarding the patient's full course of treatment)  Past Medical History  Diagnosis Date  . Osteoporosis   . Depression   . Headache   . Arthritis   . Prosthetic eye globe    Past Surgical History  Procedure Laterality Date  . Enucleation Right 2013    eye infection  . Corneal transplant Right 2007    x4  . Rhinoplasty  1980  . Appendectomy  1974   Social History   Social History  . Marital Status: Single    Spouse Name: N/A  . Number of Children: N/A  . Years of Education: N/A   Social History Main Topics  . Smoking status: Never Smoker   . Smokeless tobacco: Never Used  . Alcohol Use: No  . Drug Use: No  . Sexual Activity: Not on file   Other Topics Concern  . Not on file   Social History Narrative   Family History  Problem Relation Age of Onset  . Colon cancer Neg Hx    No Known Allergies Prior to Admission medications   Medication Sig Start Date End Date Taking? Authorizing Provider  B Complex-C (SUPER B COMPLEX PO) Take 1 tablet by mouth daily.   Yes Historical Provider, MD  calcium carbonate (OS-CAL) 600 MG TABS tablet Take 600 mg by mouth daily with breakfast.    Yes Historical Provider, MD  celecoxib (CELEBREX) 100 MG capsule Take 100 mg by mouth daily.    Yes Historical Provider, MD  cholecalciferol (VITAMIN D) 1000 UNITS tablet Take 1,000 Units by mouth daily.   Yes Historical Provider, MD  HYDROmorphone (DILAUDID) 2 MG tablet Take 1 mg by mouth every 4 (four) hours as needed for moderate pain or severe pain.   Yes Historical Provider, MD  mometasone (NASONEX) 50 MCG/ACT nasal spray Place 2 sprays into the nose daily as needed.  Allergies   Yes Historical Provider, MD  Multiple Vitamins-Minerals (HAIR SKIN AND NAILS FORMULA PO) Take 1 tablet by mouth daily.   Yes Historical Provider, MD  oxyCODONE-acetaminophen (PERCOCET/ROXICET) 5-325 MG tablet Take 1 tablet by mouth every 4 (four) hours as needed. pain 11/13/14  Yes Historical Provider, MD  polyethylene glycol (MIRALAX / GLYCOLAX) packet Take 17 g by mouth daily.   Yes Historical Provider, MD  valACYclovir (VALTREX) 1000 MG tablet Take 1,000 mg by mouth daily.    Yes Historical Provider, MD  vitamin B-12 (CYANOCOBALAMIN) 100 MCG tablet Take 100 mcg by mouth daily.   Yes Historical Provider, MD     All other systems have been reviewed and were otherwise negative with the exception of those mentioned in the HPI and as above.  Physical Exam: There were no vitals filed for this visit.  General: Alert, no acute distress Cardiovascular: No pedal edema Respiratory: No cyanosis, no use of accessory musculature Skin: No lesions in the area of chief complaint Neurologic: Sensation intact distally Psychiatric: Patient is competent for consent with normal mood and affect Lymphatic: No axillary or cervical lymphadenopathy  MUSCULOSKELETAL: + weakness on the right  Assessment/Plan: Right arm pain Plan for Procedure(s): ANTERIOR CERVICAL DECOMPRESSION/DISCECTOMY FUSION 3 LEVELS (C5-T1)  Sinclair Ship, MD 12/11/2014 8:13 AM

## 2014-12-12 ENCOUNTER — Ambulatory Visit (HOSPITAL_COMMUNITY): Payer: 59

## 2014-12-12 ENCOUNTER — Encounter (HOSPITAL_COMMUNITY): Payer: Self-pay | Admitting: *Deleted

## 2014-12-12 ENCOUNTER — Inpatient Hospital Stay (HOSPITAL_COMMUNITY)
Admission: RE | Admit: 2014-12-12 | Discharge: 2014-12-13 | DRG: 473 | Disposition: A | Discharge status: Home / Self Care | Payer: 59 | Source: Ambulatory Visit | Attending: Orthopedic Surgery | Admitting: Orthopedic Surgery

## 2014-12-12 ENCOUNTER — Encounter (HOSPITAL_COMMUNITY)
Admission: RE | Disposition: A | Discharge status: Home / Self Care | Payer: 59 | Source: Ambulatory Visit | Attending: Orthopedic Surgery

## 2014-12-12 ENCOUNTER — Ambulatory Visit (HOSPITAL_COMMUNITY): Payer: 59 | Admitting: Anesthesiology

## 2014-12-12 DIAGNOSIS — M79601 Pain in right arm: Secondary | ICD-10-CM | POA: Diagnosis present

## 2014-12-12 DIAGNOSIS — M4802 Spinal stenosis, cervical region: Secondary | ICD-10-CM | POA: Diagnosis present

## 2014-12-12 DIAGNOSIS — M4804 Spinal stenosis, thoracic region: Secondary | ICD-10-CM | POA: Diagnosis present

## 2014-12-12 DIAGNOSIS — Z79899 Other long term (current) drug therapy: Secondary | ICD-10-CM | POA: Diagnosis not present

## 2014-12-12 DIAGNOSIS — Z791 Long term (current) use of non-steroidal anti-inflammatories (NSAID): Secondary | ICD-10-CM | POA: Diagnosis not present

## 2014-12-12 DIAGNOSIS — M810 Age-related osteoporosis without current pathological fracture: Secondary | ICD-10-CM | POA: Diagnosis present

## 2014-12-12 DIAGNOSIS — M5410 Radiculopathy, site unspecified: Secondary | ICD-10-CM | POA: Diagnosis present

## 2014-12-12 DIAGNOSIS — M50122 Cervical disc disorder at C5-C6 level with radiculopathy: Principal | ICD-10-CM | POA: Diagnosis present

## 2014-12-12 DIAGNOSIS — Z970 Presence of artificial eye: Secondary | ICD-10-CM | POA: Diagnosis not present

## 2014-12-12 DIAGNOSIS — B182 Chronic viral hepatitis C: Secondary | ICD-10-CM | POA: Diagnosis present

## 2014-12-12 DIAGNOSIS — Z419 Encounter for procedure for purposes other than remedying health state, unspecified: Secondary | ICD-10-CM

## 2014-12-12 HISTORY — PX: ANTERIOR CERVICAL DECOMP/DISCECTOMY FUSION: SHX1161

## 2014-12-12 SURGERY — ANTERIOR CERVICAL DECOMPRESSION/DISCECTOMY FUSION 3 LEVELS
Anesthesia: General | Site: Neck

## 2014-12-12 MED ORDER — ONDANSETRON HCL 4 MG/2ML IJ SOLN
INTRAMUSCULAR | Status: AC
  Filled 2014-12-12: qty 2

## 2014-12-12 MED ORDER — ACETAMINOPHEN 325 MG PO TABS: 650.0000 mg | ORAL_TABLET | ORAL | Status: DC | PRN

## 2014-12-12 MED ORDER — OXYCODONE-ACETAMINOPHEN 5-325 MG PO TABS
ORAL_TABLET | ORAL | Status: AC
  Administered 2014-12-12: 2 via ORAL
  Filled 2014-12-12: qty 2

## 2014-12-12 MED ORDER — SODIUM CHLORIDE 0.9 % IV SOLN
INTRAVENOUS | Status: DC
  Administered 2014-12-12: 75 mL/h via INTRAVENOUS
  Administered 2014-12-13: 10:00:00 via INTRAVENOUS

## 2014-12-12 MED ORDER — PROPOFOL 10 MG/ML IV BOLUS
INTRAVENOUS | Status: DC | PRN
  Administered 2014-12-12: 80 mg via INTRAVENOUS

## 2014-12-12 MED ORDER — ZOLPIDEM TARTRATE 5 MG PO TABS: 5.0000 mg | ORAL_TABLET | Freq: Every evening | ORAL | Status: DC | PRN

## 2014-12-12 MED ORDER — CALCIUM CARBONATE 1250 (500 CA) MG PO TABS
1.0000 | ORAL_TABLET | Freq: Every day | ORAL | Status: DC
  Administered 2014-12-13: 500 mg via ORAL
  Filled 2014-12-12: qty 1

## 2014-12-12 MED ORDER — ACETAMINOPHEN 650 MG RE SUPP: 650.0000 mg | RECTAL | Status: DC | PRN

## 2014-12-12 MED ORDER — ROCURONIUM BROMIDE 50 MG/5ML IV SOLN
INTRAVENOUS | Status: AC
  Filled 2014-12-12: qty 1

## 2014-12-12 MED ORDER — FLUTICASONE PROPIONATE 50 MCG/ACT NA SUSP
1.0000 | Freq: Every day | NASAL | Status: DC
  Filled 2014-12-12: qty 16

## 2014-12-12 MED ORDER — THROMBIN 20000 UNITS EX KIT
PACK | CUTANEOUS | Status: DC | PRN
  Administered 2014-12-12: 20000 [IU] via TOPICAL

## 2014-12-12 MED ORDER — HYDROMORPHONE HCL 1 MG/ML IJ SOLN
INTRAMUSCULAR | Status: AC
  Administered 2014-12-12: 0.5 mg via INTRAVENOUS
  Filled 2014-12-12: qty 1

## 2014-12-12 MED ORDER — PHENYLEPHRINE 40 MCG/ML (10ML) SYRINGE FOR IV PUSH (FOR BLOOD PRESSURE SUPPORT)
PREFILLED_SYRINGE | INTRAVENOUS | Status: AC
  Filled 2014-12-12: qty 10

## 2014-12-12 MED ORDER — SUCCINYLCHOLINE CHLORIDE 20 MG/ML IJ SOLN
INTRAMUSCULAR | Status: AC
  Filled 2014-12-12: qty 1

## 2014-12-12 MED ORDER — PHENOL 1.4 % MT LIQD
1.0000 | OROMUCOSAL | Status: DC | PRN
  Filled 2014-12-12: qty 177

## 2014-12-12 MED ORDER — ARTIFICIAL TEARS OP OINT
TOPICAL_OINTMENT | OPHTHALMIC | Status: AC
  Filled 2014-12-12: qty 3.5

## 2014-12-12 MED ORDER — DOCUSATE SODIUM 100 MG PO CAPS
100.0000 mg | ORAL_CAPSULE | Freq: Two times a day (BID) | ORAL | Status: DC
  Administered 2014-12-12 – 2014-12-13 (×2): 100 mg via ORAL
  Filled 2014-12-12 (×2): qty 1

## 2014-12-12 MED ORDER — ALUM & MAG HYDROXIDE-SIMETH 200-200-20 MG/5ML PO SUSP: 30.0000 mL | Freq: Four times a day (QID) | ORAL | Status: DC | PRN

## 2014-12-12 MED ORDER — PROPOFOL 10 MG/ML IV BOLUS
INTRAVENOUS | Status: AC
  Filled 2014-12-12: qty 20

## 2014-12-12 MED ORDER — LIDOCAINE HCL (CARDIAC) 20 MG/ML IV SOLN
INTRAVENOUS | Status: DC | PRN
  Administered 2014-12-12: 60 mg via INTRAVENOUS

## 2014-12-12 MED ORDER — SENNOSIDES-DOCUSATE SODIUM 8.6-50 MG PO TABS: 1.0000 | ORAL_TABLET | Freq: Every evening | ORAL | Status: DC | PRN

## 2014-12-12 MED ORDER — ARTIFICIAL TEARS OP OINT
TOPICAL_OINTMENT | OPHTHALMIC | Status: DC | PRN
  Administered 2014-12-12: 1 via OPHTHALMIC

## 2014-12-12 MED ORDER — CEFAZOLIN SODIUM-DEXTROSE 2-3 GM-% IV SOLR
2.0000 g | Freq: Three times a day (TID) | INTRAVENOUS | Status: AC
  Administered 2014-12-12 – 2014-12-13 (×2): 2 g via INTRAVENOUS
  Filled 2014-12-12 (×2): qty 50

## 2014-12-12 MED ORDER — ONDANSETRON HCL 4 MG/2ML IJ SOLN: 4.0000 mg | INTRAMUSCULAR | Status: DC | PRN

## 2014-12-12 MED ORDER — LACTATED RINGERS IV SOLN
INTRAVENOUS | Status: DC
  Administered 2014-12-12 (×2): via INTRAVENOUS

## 2014-12-12 MED ORDER — MENTHOL 3 MG MT LOZG: 1.0000 | LOZENGE | OROMUCOSAL | Status: DC | PRN

## 2014-12-12 MED ORDER — OXYCODONE HCL 5 MG/5ML PO SOLN: 5.0000 mg | Freq: Once | ORAL | Status: DC | PRN

## 2014-12-12 MED ORDER — 0.9 % SODIUM CHLORIDE (POUR BTL) OPTIME
TOPICAL | Status: DC | PRN
  Administered 2014-12-12: 1000 mL

## 2014-12-12 MED ORDER — DIAZEPAM 5 MG PO TABS
5.0000 mg | ORAL_TABLET | Freq: Four times a day (QID) | ORAL | Status: DC | PRN
  Administered 2014-12-12: 5 mg via ORAL
  Filled 2014-12-12: qty 1

## 2014-12-12 MED ORDER — ONDANSETRON HCL 4 MG/2ML IJ SOLN
INTRAMUSCULAR | Status: DC | PRN
  Administered 2014-12-12: 4 mg via INTRAVENOUS

## 2014-12-12 MED ORDER — PROMETHAZINE HCL 25 MG/ML IJ SOLN: 6.2500 mg | INTRAMUSCULAR | Status: DC | PRN

## 2014-12-12 MED ORDER — CEFAZOLIN SODIUM-DEXTROSE 2-3 GM-% IV SOLR
2.0000 g | INTRAVENOUS | Status: AC
  Administered 2014-12-12: 2 g via INTRAVENOUS
  Filled 2014-12-12: qty 50

## 2014-12-12 MED ORDER — FENTANYL CITRATE (PF) 250 MCG/5ML IJ SOLN
INTRAMUSCULAR | Status: AC
  Filled 2014-12-12: qty 5

## 2014-12-12 MED ORDER — POVIDONE-IODINE 7.5 % EX SOLN
Freq: Once | CUTANEOUS | Status: DC
  Filled 2014-12-12: qty 118

## 2014-12-12 MED ORDER — OXYCODONE HCL 5 MG PO TABS: 5.0000 mg | ORAL_TABLET | Freq: Once | ORAL | Status: DC | PRN

## 2014-12-12 MED ORDER — THROMBIN 20000 UNITS EX SOLR
CUTANEOUS | Status: AC
  Filled 2014-12-12: qty 20000

## 2014-12-12 MED ORDER — BUPIVACAINE-EPINEPHRINE (PF) 0.25% -1:200000 IJ SOLN
INTRAMUSCULAR | Status: AC
  Filled 2014-12-12: qty 30

## 2014-12-12 MED ORDER — FENTANYL CITRATE (PF) 100 MCG/2ML IJ SOLN
INTRAMUSCULAR | Status: DC | PRN
  Administered 2014-12-12: 100 ug via INTRAVENOUS
  Administered 2014-12-12: 50 ug via INTRAVENOUS
  Administered 2014-12-12: 100 ug via INTRAVENOUS

## 2014-12-12 MED ORDER — MIDAZOLAM HCL 5 MG/5ML IJ SOLN
INTRAMUSCULAR | Status: DC | PRN
  Administered 2014-12-12: 2 mg via INTRAVENOUS

## 2014-12-12 MED ORDER — FLEET ENEMA 7-19 GM/118ML RE ENEM: 1.0000 | ENEMA | Freq: Once | RECTAL | Status: DC | PRN

## 2014-12-12 MED ORDER — PHENYLEPHRINE HCL 10 MG/ML IJ SOLN
INTRAMUSCULAR | Status: DC | PRN
  Administered 2014-12-12: 80 ug via INTRAVENOUS
  Administered 2014-12-12 (×2): 120 ug via INTRAVENOUS
  Administered 2014-12-12: 80 ug via INTRAVENOUS

## 2014-12-12 MED ORDER — SODIUM CHLORIDE 0.9 % IJ SOLN
3.0000 mL | Freq: Two times a day (BID) | INTRAMUSCULAR | Status: DC
  Administered 2014-12-12: 3 mL via INTRAVENOUS

## 2014-12-12 MED ORDER — BISACODYL 5 MG PO TBEC: 5.0000 mg | DELAYED_RELEASE_TABLET | Freq: Every day | ORAL | Status: DC | PRN

## 2014-12-12 MED ORDER — ROCURONIUM BROMIDE 100 MG/10ML IV SOLN
INTRAVENOUS | Status: DC | PRN
  Administered 2014-12-12: 30 mg via INTRAVENOUS
  Administered 2014-12-12: 20 mg via INTRAVENOUS

## 2014-12-12 MED ORDER — VITAMIN D 1000 UNITS PO TABS
1000.0000 [IU] | ORAL_TABLET | Freq: Every day | ORAL | Status: DC
  Administered 2014-12-13: 1000 [IU] via ORAL
  Filled 2014-12-12: qty 1

## 2014-12-12 MED ORDER — HYDROMORPHONE HCL 1 MG/ML IJ SOLN
0.5000 mg | INTRAMUSCULAR | Status: DC | PRN
  Administered 2014-12-12 – 2014-12-13 (×5): 1 mg via INTRAVENOUS
  Filled 2014-12-12 (×5): qty 1

## 2014-12-12 MED ORDER — OXYCODONE-ACETAMINOPHEN 5-325 MG PO TABS
2.0000 | ORAL_TABLET | Freq: Once | ORAL | Status: AC
  Administered 2014-12-12: 2 via ORAL
  Filled 2014-12-12: qty 2

## 2014-12-12 MED ORDER — HYDROMORPHONE HCL 1 MG/ML IJ SOLN
0.2500 mg | INTRAMUSCULAR | Status: DC | PRN
  Administered 2014-12-12 (×4): 0.5 mg via INTRAVENOUS

## 2014-12-12 MED ORDER — LIDOCAINE HCL (CARDIAC) 20 MG/ML IV SOLN
INTRAVENOUS | Status: AC
  Filled 2014-12-12: qty 5

## 2014-12-12 MED ORDER — VALACYCLOVIR HCL 500 MG PO TABS
1000.0000 mg | ORAL_TABLET | Freq: Every day | ORAL | Status: DC
  Administered 2014-12-13: 1000 mg via ORAL
  Filled 2014-12-12 (×2): qty 2

## 2014-12-12 MED ORDER — OXYCODONE-ACETAMINOPHEN 5-325 MG PO TABS
1.0000 | ORAL_TABLET | ORAL | Status: DC | PRN
  Administered 2014-12-12: 1 via ORAL
  Administered 2014-12-13 (×3): 2 via ORAL
  Filled 2014-12-12 (×3): qty 2
  Filled 2014-12-12: qty 1

## 2014-12-12 MED ORDER — SODIUM CHLORIDE 0.9 % IV SOLN
250.0000 mL | INTRAVENOUS | Status: DC
  Administered 2014-12-12: 250 mL via INTRAVENOUS

## 2014-12-12 MED ORDER — MIDAZOLAM HCL 2 MG/2ML IJ SOLN
INTRAMUSCULAR | Status: AC
  Filled 2014-12-12: qty 2

## 2014-12-12 MED ORDER — SODIUM CHLORIDE 0.9 % IJ SOLN: 3.0000 mL | INTRAMUSCULAR | Status: DC | PRN

## 2014-12-12 SURGICAL SUPPLY — 78 items
BENZOIN TINCTURE PRP APPL 2/3 (GAUZE/BANDAGES/DRESSINGS) ×2 IMPLANT
BIT DRILL NEURO 2X3.1 SFT TUCH (MISCELLANEOUS) ×1 IMPLANT
BIT DRILL SRG 14X2.2XFLT CHK (BIT) ×1 IMPLANT
BIT DRL SRG 14X2.2XFLT CHK (BIT) ×1
BLADE SURG 15 STRL LF DISP TIS (BLADE) ×1 IMPLANT
BLADE SURG 15 STRL SS (BLADE) ×1
BLADE SURG ROTATE 9660 (MISCELLANEOUS) ×2 IMPLANT
CARTRIDGE OIL MAESTRO DRILL (MISCELLANEOUS) ×1 IMPLANT
CLSR STERI-STRIP ANTIMIC 1/2X4 (GAUZE/BANDAGES/DRESSINGS) ×2 IMPLANT
CORDS BIPOLAR (ELECTRODE) ×2 IMPLANT
COVER SURGICAL LIGHT HANDLE (MISCELLANEOUS) ×2 IMPLANT
CRADLE DONUT ADULT HEAD (MISCELLANEOUS) ×2 IMPLANT
DIFFUSER DRILL AIR PNEUMATIC (MISCELLANEOUS) ×2 IMPLANT
DRAIN JACKSON RD 7FR 3/32 (WOUND CARE) IMPLANT
DRAPE C-ARM 42X72 X-RAY (DRAPES) ×2 IMPLANT
DRAPE POUCH INSTRU U-SHP 10X18 (DRAPES) ×2 IMPLANT
DRAPE SURG 17X23 STRL (DRAPES) ×6 IMPLANT
DRILL BIT SKYLINE 14MM (BIT) ×2
DRILL NEURO 2X3.1 SOFT TOUCH (MISCELLANEOUS) ×2
DURAPREP 26ML APPLICATOR (WOUND CARE) ×2 IMPLANT
ELECT COATED BLADE 2.86 ST (ELECTRODE) ×2 IMPLANT
ELECT REM PT RETURN 9FT ADLT (ELECTROSURGICAL) ×2
ELECTRODE REM PT RTRN 9FT ADLT (ELECTROSURGICAL) ×1 IMPLANT
EVACUATOR SILICONE 100CC (DRAIN) IMPLANT
GAUZE SPONGE 4X4 12PLY STRL (GAUZE/BANDAGES/DRESSINGS) ×2 IMPLANT
GAUZE SPONGE 4X4 16PLY XRAY LF (GAUZE/BANDAGES/DRESSINGS) ×2 IMPLANT
GLOVE BIO SURGEON STRL SZ7 (GLOVE) ×2 IMPLANT
GLOVE BIO SURGEON STRL SZ8 (GLOVE) ×2 IMPLANT
GLOVE BIOGEL PI IND STRL 7.0 (GLOVE) ×2 IMPLANT
GLOVE BIOGEL PI IND STRL 8 (GLOVE) ×1 IMPLANT
GLOVE BIOGEL PI INDICATOR 7.0 (GLOVE) ×2
GLOVE BIOGEL PI INDICATOR 8 (GLOVE) ×1
GOWN STRL REUS W/ TWL LRG LVL3 (GOWN DISPOSABLE) ×1 IMPLANT
GOWN STRL REUS W/ TWL XL LVL3 (GOWN DISPOSABLE) ×1 IMPLANT
GOWN STRL REUS W/TWL LRG LVL3 (GOWN DISPOSABLE) ×2
GOWN STRL REUS W/TWL XL LVL3 (GOWN DISPOSABLE) ×2
INTERLOCK LRDTC CRVCL VBR 6MM (Bone Implant) ×2 IMPLANT
INTERLOCK LRDTC CRVCL VBR 7MM (Bone Implant) ×1 IMPLANT
IV CATH 14GX2 1/4 (CATHETERS) ×2 IMPLANT
KIT BASIN OR (CUSTOM PROCEDURE TRAY) ×2 IMPLANT
KIT ROOM TURNOVER OR (KITS) ×2 IMPLANT
LORDOTIC CERVICAL VBR 6MM SM (Bone Implant) ×4 IMPLANT
LORDOTIC CERVICAL VBR 7MM SM (Bone Implant) ×2 IMPLANT
MANIFOLD NEPTUNE II (INSTRUMENTS) ×2 IMPLANT
NEEDLE 27GAX1X1/2 (NEEDLE) ×2 IMPLANT
NEEDLE SPNL 20GX3.5 QUINCKE YW (NEEDLE) ×2 IMPLANT
NS IRRIG 1000ML POUR BTL (IV SOLUTION) ×2 IMPLANT
OIL CARTRIDGE MAESTRO DRILL (MISCELLANEOUS) ×2
PACK ORTHO CERVICAL (CUSTOM PROCEDURE TRAY) ×2 IMPLANT
PAD ARMBOARD 7.5X6 YLW CONV (MISCELLANEOUS) ×4 IMPLANT
PATTIES SURGICAL .5 X.5 (GAUZE/BANDAGES/DRESSINGS) IMPLANT
PATTIES SURGICAL .5 X1 (DISPOSABLE) IMPLANT
PIN DISTRACTION 12MM (PIN) ×1
PIN DISTRACTION 14 (PIN) ×4 IMPLANT
PIN DSTRCT 12XNS SS ACIS (PIN) ×1 IMPLANT
PLATE SKYLINE 3LVL 45MM CERV (Plate) ×2 IMPLANT
PUTTY BONE DBX 5CC MIX (Putty) ×2 IMPLANT
SCREW SKYLINE VAR OS 14MM (Screw) ×14 IMPLANT
SCREW SKYLINE VARIABLE LG (Screw) ×2 IMPLANT
SPONGE GAUZE 4X4 12PLY STER LF (GAUZE/BANDAGES/DRESSINGS) ×2 IMPLANT
SPONGE INTESTINAL PEANUT (DISPOSABLE) ×2 IMPLANT
SPONGE SURGIFOAM ABS GEL 100 (HEMOSTASIS) IMPLANT
STRIP CLOSURE SKIN 1/2X4 (GAUZE/BANDAGES/DRESSINGS) ×2 IMPLANT
SURGIFLO W/THROMBIN 8M KIT (HEMOSTASIS) IMPLANT
SUT MNCRL AB 4-0 PS2 18 (SUTURE) IMPLANT
SUT SILK 4 0 (SUTURE)
SUT SILK 4-0 18XBRD TIE 12 (SUTURE) IMPLANT
SUT VIC AB 2-0 CT2 18 VCP726D (SUTURE) ×2 IMPLANT
SYR BULB IRRIGATION 50ML (SYRINGE) ×2 IMPLANT
SYR CONTROL 10ML LL (SYRINGE) ×2 IMPLANT
TAPE CLOTH 4X10 WHT NS (GAUZE/BANDAGES/DRESSINGS) ×2 IMPLANT
TAPE CLOTH SURG 4X10 WHT LF (GAUZE/BANDAGES/DRESSINGS) ×2 IMPLANT
TAPE UMBILICAL COTTON 1/8X30 (MISCELLANEOUS) ×4 IMPLANT
TOWEL OR 17X24 6PK STRL BLUE (TOWEL DISPOSABLE) ×2 IMPLANT
TOWEL OR 17X26 10 PK STRL BLUE (TOWEL DISPOSABLE) ×4 IMPLANT
TRAY FOLEY CATH 16FRSI W/METER (SET/KITS/TRAYS/PACK) ×2 IMPLANT
WATER STERILE IRR 1000ML POUR (IV SOLUTION) ×2 IMPLANT
YANKAUER SUCT BULB TIP NO VENT (SUCTIONS) ×2 IMPLANT

## 2014-12-12 NOTE — Progress Notes (Signed)
Pt received at this time alert, verbal with no noted distress. Pt stable, able to follow simple commands. Surgical gauze dressing dry and intact. Cervical collar on and aligned.  Family at bedside. Pt oriented to room. Safety measures in place. Call bell within reach.

## 2014-12-12 NOTE — Anesthesia Preprocedure Evaluation (Addendum)
Anesthesia Evaluation  Patient identified by MRN, date of birth, ID band Patient awake    Reviewed: Allergy & Precautions, NPO status , Patient's Chart, lab work & pertinent test results  Airway Mallampati: II  TM Distance: >3 FB Neck ROM: Full    Dental  (+) Dental Advisory Given   Pulmonary    breath sounds clear to auscultation       Cardiovascular negative cardio ROS   Rhythm:Regular Rate:Normal     Neuro/Psych Depression Cervical radiculopathy    GI/Hepatic negative GI ROS, (+) Hepatitis -, C  Endo/Other  negative endocrine ROS  Renal/GU negative Renal ROS     Musculoskeletal   Abdominal   Peds  Hematology negative hematology ROS (+)   Anesthesia Other Findings   Reproductive/Obstetrics                           Lab Results  Component Value Date   WBC 5.9 12/07/2014   HGB 14.6 12/07/2014   HCT 44.0 12/07/2014   MCV 94.8 12/07/2014   PLT 137* 12/07/2014   Lab Results  Component Value Date   CREATININE 0.81 12/07/2014   BUN 11 12/07/2014   NA 140 12/07/2014   K 4.4 12/07/2014   CL 102 12/07/2014   CO2 30 12/07/2014    Anesthesia Physical Anesthesia Plan  ASA: II  Anesthesia Plan: General   Post-op Pain Management:    Induction: Intravenous  Airway Management Planned: Oral ETT  Additional Equipment:   Intra-op Plan:   Post-operative Plan: Extubation in OR  Informed Consent: I have reviewed the patients History and Physical, chart, labs and discussed the procedure including the risks, benefits and alternatives for the proposed anesthesia with the patient or authorized representative who has indicated his/her understanding and acceptance.   Dental advisory given  Plan Discussed with: CRNA  Anesthesia Plan Comments:         Anesthesia Quick Evaluation

## 2014-12-12 NOTE — Progress Notes (Signed)
Patient reports that she is starting to get uncomfortable and requesting something for pain. Patient states that she normally takes 2 percocet every 2 hours.

## 2014-12-12 NOTE — Transfer of Care (Signed)
Immediate Anesthesia Transfer of Care Note  Patient: [name]  Procedure(s) Performed: Procedure(s) with comments: ANTERIOR CERVICAL DECOMPRESSION/DISCECTOMY FUSION 3 LEVELS (N/A) - Anterior cervical decompression fusion, cerivcal 5-6, cervical 6-7, cervical 7-thoracic 1 with instrumentation and allografrt  Patient Location: PACU  Anesthesia Type:General  Level of Consciousness: awake, alert  and oriented  Airway & Oxygen Therapy: Patient Spontanous Breathing and Patient connected to nasal cannula oxygen  Post-op Assessment: Report given to RN and Patient moving all extremities X 4  Post vital signs: Reviewed and stable  Last Vitals:  Filed Vitals:   12/12/14 1047  BP:   Pulse:   Temp: 36.1 C  Resp:     Complications: No apparent anesthesia complications

## 2014-12-12 NOTE — Anesthesia Procedure Notes (Signed)
Procedure Name: Intubation Date/Time: 12/12/2014 2:05 PM Performed by: Sampson Si E Pre-anesthesia Checklist: Patient identified, Emergency Drugs available, Suction available, Patient being monitored and Timeout performed Patient Re-evaluated:Patient Re-evaluated prior to inductionOxygen Delivery Method: Circle system utilized Preoxygenation: Pre-oxygenation with 100% oxygen Intubation Type: IV induction Ventilation: Mask ventilation without difficulty Laryngoscope Size: Mac and 3 Grade View: Grade I Tube type: Oral Tube size: 7.0 mm Number of attempts: 1 Airway Equipment and Method: Stylet Placement Confirmation: ETT inserted through vocal cords under direct vision,  positive ETCO2 and breath sounds checked- equal and bilateral Secured at: 21 cm Tube secured with: Tape Dental Injury: Teeth and Oropharynx as per pre-operative assessment

## 2014-12-13 ENCOUNTER — Encounter (HOSPITAL_COMMUNITY): Payer: Self-pay | Admitting: Orthopedic Surgery

## 2014-12-13 MED FILL — Thrombin For Soln 20000 Unit: CUTANEOUS | Qty: 1 | Status: AC

## 2014-12-13 NOTE — Progress Notes (Signed)
Pt discharging at this time with family taking all personal belongings including cervical collars. IV discontinued, dry dressing applied. Discharge instructions along with prescriptions provided with verbal understanding. Pt has a follow up appt scheduled. No noted distress.

## 2014-12-13 NOTE — Progress Notes (Signed)
    Patient doing well Patient reports resolution of R arm pain Had a HA overnight   Physical Exam: Filed Vitals:   12/13/14 0545  BP: 111/85  Pulse: 82  Temp: 98.4 F (36.9 C)  Resp: 16    Dressing in place NVI Collar fitting appropriately  POD #1 s/p C5-T1 ACDF, doing well  - encourage ambulation - Percocet for pain, Valium for muscle spasms - likely d/c home today, f/u in 2 weeks - shower collar prior to d/c

## 2014-12-13 NOTE — Op Note (Signed)
[name], [name] NO.:  [id number]  MEDICAL RECORD NO.:  [id number]  LOCATION:  [id number]                        FACILITY:  [facility]  PHYSICIAN:  Phylliss Bob, MD      DATE OF BIRTH:  [birth year]  DATE OF PROCEDURE:  12/12/2014                              OPERATIVE REPORT   PREOPERATIVE DIAGNOSES: 1. Right-sided cervical radiculopathy. 2. 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 3. Cervical degenerative disc disease C5-T1.  POSTOPERATIVE DIAGNOSES: 1. Right-sided cervical radiculopathy. 2. 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 3. Cervical degenerative disc disease C5-T1.  PROCEDURES: 1. Anterior cervical decompression and fusion at C5-6, C6-7, C7-T1. 2. Placement of anterior instrumentation at C5 to T1. 3. Insertion of interbody device x3 (Titan intervertebral spacers). 4. Use of morselized allograft -- DBX mix. 5. Intraoperative use of fluoroscopy.  SURGEON:  Phylliss Bob, M.D.  ASSISTANT:  Pricilla Holm, PA-C.  ANESTHESIA:  General endotracheal anesthesia.  COMPLICATIONS:  None.  DISPOSITION:  Stable.  ESTIMATED BLOOD LOSS:  Minimal.  INDICATIONS FOR SURGERY:  Briefly, Ms. Hahm is a very pleasant 59- year-old female, who did present to me with severe pain in her right arm.  The pain was constant and severe.  An MRI did reveal neuroforaminal stenosis as reflected above.  The patient did also have weakness in her right hand.  Given the MRI findings reflected above and the patient's ongoing and severe pain and weakness, we did discuss proceeding with the procedure reflected above.  The patient did fully understand the risks and limitations of the procedure as outlined in my preoperative note.  OPERATIVE DETAILS:  On December 12, 2014, the patient was brought to surgery and general endotracheal anesthesia was administered.  The patient was placed supine on hospital bed.  The neck was gently extended.  The neck was then  prepped and draped in the usual sterile fashion, and a time-out procedure was performed.  I then made a left- sided transverse incision in line with the C6-7 intervertebral space. The platysma was incised.  A Smith-Robinson approach was used and the anterior spine was identified.  A lateral intraoperative fluoroscopic view did confirm the appropriate operative level.  I then subperiosteally exposed the vertebral bodies of C5, C6, C7, and T1.  I then turned my attention over the C7-T1 intervertebral space.  A self- retaining retractor was placed.  Caspar pins were placed into the C6 and C7 vertebral bodies and distraction was applied.  I then proceeded with a thorough and complete intervertebral diskectomy.  Upon encountering the right neural foramen, there was noted to be a prominent extruded disk fragment located in the foramen, which was removed.  In doing so, I was able to fully decompress the neural foramen on the right at the C7- T1 level.  The decompression was confirmed using a nerve hook.  The endplates were then prepared and the appropriate size interbody spacer was packed with DBX mix and tamped into position in the usual fashion. The lower Caspar pin was removed, then bone wax was placed in its place. I then turned my attention to the C6-7 intervertebral space.  Again, a Caspar pin was placed into the upper vertebral body and  distraction was applied across the intervertebral space.  A diskectomy was again performed and the bilateral neural foramina were decompressed.  After preparing the endplates, the appropriate size interbody spacer was packed with DBX mix and tamped into position.  The lower Caspar pin was removed and bone wax was placed in its place.  I then placed a new Caspar pin into the C5 vertebral body and again, distraction was applied.  As previously described, a thorough diskectomy was performed and a bilateral neural foraminal decompression was performed.   I confirmed using a nerve hook.  After preparing the endplates, the appropriate size interbody spacer was packed with DBX mix and tamped into position in the usual fashion.  I was pleased with the press-fit of each of the implants.  The Caspar pins were removed and bone wax was placed in their place.  The appropriate sized anterior cervical plate was placed over the anterior spine.  14 mm variable angle screws were placed, 2 in each vertebral body from C5 to T1 for a total of 8 vertebral body screws.  Adequate purchase of the screws were noted.  The screws were then locked to the plate using the Cam locking mechanism.  I was very pleased with the final fluoroscopic images.  The wound was then copiously irrigated and all bleeding was controlled.  The wound was then closed in layers using 2-0 Vicryl followed by 3-0 Monocryl.  Benzoin and Steri-Strips were applied followed by sterile dressing.  All instrument counts were correct at the termination of the procedure.  Of note, Pricilla Holm was my assistant throughout the surgery and did aid in retraction, suctioning, and closure.     Phylliss Bob, MD     MD/MEDQ  D:  12/12/2014  T:  12/13/2014  Job:  [id number]

## 2014-12-13 NOTE — Progress Notes (Signed)
Pt ambulated with rolling walker to the bathroom brace on and aligned. No noted distress. Will continue to monitor.

## 2014-12-13 NOTE — Progress Notes (Signed)
Orthopedic Tech Progress Note Patient Details:  [name] [birth year] [id number]  Ortho Devices Type of Ortho Device: [place] cervical collar Ortho Device/Splint Location: take home Ortho Device/Splint Interventions: [name] 12/13/2014, 4:48 PM

## 2014-12-13 NOTE — Progress Notes (Signed)
Patient with discharge order but no available transportation until this evening from son.   Ave Filter, RN

## 2014-12-13 NOTE — Care Management Note (Signed)
Case Management Note  Patient Details  Name: [name] MRN: [id number] Date of Birth: Sep 09, 1955  Subjective/Objective:   Patient admitted with Radiculopathy and underwent a C5-T1 ACDF. Patient is from home with family.                Action/Plan: Patient being discharged home today with self care. No further needs per CM.   Expected Discharge Date:    12/13/14            Expected Discharge Plan:  Home/Self Care  In-House Referral:     Discharge planning Services     Post Acute Care Choice:    Choice offered to:     DME Arranged:    DME Agency:     HH Arranged:    HH Agency:     Status of Service:  Completed, signed off  Medicare Important Message Given:    Date Medicare IM Given:    Medicare IM give by:    Date Additional Medicare IM Given:    Additional Medicare Important Message give by:     If discussed at [place] of Stay Meetings, dates discussed:    Additional Comments:  Pollie Friar, RN 12/13/2014, 10:00 AM

## 2014-12-25 NOTE — Anesthesia Postprocedure Evaluation (Signed)
Anesthesia Post Note  Patient: [name]  Procedure(s) Performed: Procedure(s) (LRB): ANTERIOR CERVICAL DECOMPRESSION/DISCECTOMY FUSION 3 LEVELS (N/A)  Patient location during evaluation: PACU Anesthesia Type: General Level of consciousness: awake and alert Pain management: pain level controlled Vital Signs Assessment: post-procedure vital signs reviewed and stable Respiratory status: spontaneous breathing Cardiovascular status: blood pressure returned to baseline Postop Assessment: no signs of nausea or vomiting Anesthetic complications: no    Last Vitals:  Filed Vitals:   12/13/14 1024 12/13/14 1436  BP: 98/64 137/77  Pulse: 80 81  Temp: 36.9 C 36.8 C  Resp: 16 20    Last Pain:  Filed Vitals:   12/13/14 1622  PainSc: 5     LLE Motor Response: Purposeful movement LLE Sensation: Full sensation RLE Motor Response: Purposeful movement RLE Sensation: Full sensation      Tiajuana Amass

## 2014-12-27 NOTE — Discharge Summary (Signed)
Patient ID: [name] MRN: [id number] DOB/AGE: Mar 31, 1955 59 y.o.  Admit date: 12/12/2014 Discharge date: 12/13/2014  Admission Diagnoses:  Active Problems:   Radiculopathy   Discharge Diagnoses:  Same  Past Medical History  Diagnosis Date  . Osteoporosis   . Depression   . Headache   . Arthritis   . Prosthetic eye globe     Surgeries: Procedure(s): ANTERIOR CERVICAL DECOMPRESSION/DISCECTOMY FUSION 3 LEVELS C5-7 on 12/12/2014   Consultants:  None  Discharged Condition: Improved  Hospital Course: [name] is an 60 y.o. female who was admitted 12/12/2014 for operative treatment of radiculopathy. Patient has severe unremitting pain that affects sleep, daily activities, and work/hobbies. After pre-op clearance the patient was taken to the operating room on 12/12/2014 and underwent  Procedure(s): ANTERIOR CERVICAL DECOMPRESSION/DISCECTOMY FUSION 3 LEVELS C5-7.    Patient was given perioperative antibiotics:  Anti-infectives    Start     Dose/Rate Route Frequency Ordered Stop   12/12/14 2200  ceFAZolin (ANCEF) IVPB 2 g/50 mL premix     2 g 100 mL/hr over 30 Minutes Intravenous Every 8 hours 12/12/14 1839 12/13/14 0653   12/12/14 1900  valACYclovir (VALTREX) tablet 1,000 mg  Status:  Discontinued     1,000 mg Oral Daily 12/12/14 1839 12/13/14 2008   12/12/14 1016  ceFAZolin (ANCEF) IVPB 2 g/50 mL premix     2 g 100 mL/hr over 30 Minutes Intravenous On call to O.R. 12/12/14 1016 12/12/14 1410       Patient was given sequential compression devices, early ambulation to prevent DVT.  Patient benefited maximally from hospital stay and there were no complications.    Recent vital signs: BP 137/77 mmHg  Pulse 81  Temp(Src) 98.3 F (36.8 C) (Oral)  Resp 20  Ht 5\' 2"  (1.575 m)  Wt 55.203 kg (121 lb 11.2 oz)  BMI 22.25 kg/m2  SpO2 96%  Discharge Medications:     Medication List    TAKE these medications        calcium carbonate 600 MG Tabs tablet    Commonly known as:  OS-CAL  Take 600 mg by mouth daily with breakfast.     cholecalciferol 1000 UNITS tablet  Commonly known as:  VITAMIN D  Take 1,000 Units by mouth daily.     HAIR SKIN AND NAILS FORMULA PO  Take 1 tablet by mouth daily.     mometasone 50 MCG/ACT nasal spray  Commonly known as:  NASONEX  Place 2 sprays into the nose daily as needed. Allergies     polyethylene glycol packet  Commonly known as:  MIRALAX / GLYCOLAX  Take 17 g by mouth daily.     SUPER B COMPLEX PO  Take 1 tablet by mouth daily.     valACYclovir 1000 MG tablet  Commonly known as:  VALTREX  Take 1,000 mg by mouth daily.     vitamin B-12 100 MCG tablet  Commonly known as:  CYANOCOBALAMIN  Take 100 mcg by mouth daily.        Diagnostic Studies: Dg Chest 2 View  12/07/2014  CLINICAL DATA:  Surgery.  Prior history of pneumonia. EXAM: CHEST  2 VIEW COMPARISON:  08/22/2008. FINDINGS: Mediastinum and hilar structures normal. Stable left base pleural parenchymal thickening consistent with scarring. Lungs are clear of acute infiltrates. Heart size normal. No acute bony abnormality. IMPRESSION: Stable left base pleural parenchymal thickening noted consistent with scarring. Electronically Signed   By: Marcello Moores  Register   On:  12/07/2014 09:16   Dg Cervical Spine 1 View  12/12/2014  CLINICAL DATA:  Cervical disc herniation and radiculitis. EXAM: CERVICAL SPINE 1 VIEW COMPARISON:  None. FINDINGS: Interbody cages and anterior fixation plate and screws are seen in place from levels of C5-T1. Cervical alignment appears normal. Visualization of cervical thoracic junction is limited due to position of patient shoulders and other overlying wires on this exam. IMPRESSION: Anterior cervical spine fusion hardware from C5-T1. Electronically Signed   By: Earle Gell M.D.   On: 12/12/2014 17:29   Dg C-arm Gt 120 Min  12/12/2014  CLINICAL DATA:  Cervical disc herniation and radiculitis. EXAM: CERVICAL SPINE 1 VIEW  COMPARISON:  None. FINDINGS: Interbody cages and anterior fixation plate and screws are seen in place from levels of C5-T1. Cervical alignment appears normal. Visualization of cervical thoracic junction is limited due to position of patient shoulders and other overlying wires on this exam. IMPRESSION: Anterior cervical spine fusion hardware from C5-T1. Electronically Signed   By: Earle Gell M.D.   On: 12/12/2014 17:29    Disposition: 01-Home or Self Care      Discharge Instructions    Non weight bearing    Complete by:  As directed   Laterality:  left  Extremity:  Lower          POD #1 s/p C5-T1 ACDF, doing well  - encourage ambulation - Percocet for pain, Valium for muscle spasms - shower collar prior to d/c -Written scripts for pain signed and in chart -D/C instructions sheet printed and in chart -D/C today  -F/U in office 2 weeks   Signed: Justice Britain 12/27/2014, 11:41 AM

## 2015-01-03 ENCOUNTER — Other Ambulatory Visit: Payer: Self-pay

## 2015-01-17 ENCOUNTER — Ambulatory Visit: Payer: Self-pay | Admitting: Internal Medicine

## 2015-01-30 ENCOUNTER — Ambulatory Visit: Payer: 59 | Attending: Orthopedic Surgery | Admitting: Physical Therapy

## 2015-01-30 ENCOUNTER — Encounter: Payer: Self-pay | Admitting: Physical Therapy

## 2015-01-30 DIAGNOSIS — M436 Torticollis: Secondary | ICD-10-CM | POA: Diagnosis not present

## 2015-01-30 DIAGNOSIS — M542 Cervicalgia: Secondary | ICD-10-CM | POA: Insufficient documentation

## 2015-01-30 NOTE — Therapy (Signed)
[facility] [place], [place], [address] Phone: [phone]   Fax:  [phone]  Physical Therapy Evaluation  Patient Details  Name: [name] MRN: [id number] Date of Birth: 08-Nov-1955 Referring Provider: Lynann Bologna  Encounter Date: 01/30/2015      PT End of Session - 01/30/15 1606    Visit Number 1   Date for PT Re-Evaluation 03/30/15   PT Start Time 1403   PT Stop Time 1510   PT Time Calculation (min) 67 min   Activity Tolerance Patient limited by pain   Behavior During Therapy Anxious      Past Medical History  Diagnosis Date  . Osteoporosis   . Depression   . Headache   . Arthritis   . Prosthetic eye globe     Past Surgical History  Procedure Laterality Date  . Enucleation Right 2013    eye infection  . Corneal transplant Right 2007    x4  . Rhinoplasty  1980  . Appendectomy  1974  . Anterior cervical decomp/discectomy fusion N/A 12/12/2014    Procedure: ANTERIOR CERVICAL DECOMPRESSION/DISCECTOMY FUSION 3 LEVELS;  Surgeon: Phylliss Bob, MD;  Location: [facility];  Service: Orthopedics;  Laterality: N/A;  Anterior cervical decompression fusion, cerivcal 5-6, cervical 6-7, cervical 7-thoracic 1 with instrumentation and allografrt    There were no vitals filed for this visit.  Visit Diagnosis:  Neck pain - Plan: PT plan of care cert/re-cert  Neck stiffness - Plan: PT plan of care cert/re-cert      Subjective Assessment - 01/30/15 1438    Subjective Patient reports that she underwent a 3 level ACDF on 12/12/14.  She reports that she has been in pain since the surgery more in teh rhomboids and in the upper traps.  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   Limitations Sitting;Reading;Lifting;Standing;House hold activities   Patient Stated Goals have less pain with being able to go work   Currently in Pain? Yes   Pain Score 7    Pain Location Neck   Pain  Orientation Lower   Pain Descriptors / Indicators Aching;Sore;Tightness   Pain Type Surgical pain   Pain Onset More than a month ago   Pain Frequency Constant   Aggravating Factors  sitting > 5 minutes, housework pain will increase to 9/10   Pain Relieving Factors walking, rest pain can get down to a 5/10   Effect of Pain on Daily Activities difficulty with ADL's            [facility] PT Assessment - 01/30/15 0001    Assessment   Medical Diagnosis 3 level ACDF   Referring Provider [place]   Onset Date/Surgical Date 12/12/14   Precautions   Precautions None   Balance Screen   Has the patient fallen in the past 6 months No   Has the patient had a decrease in activity level because of a fear of falling?  No   Is the patient reluctant to leave their home because of a fear of falling?  No   Home Environment   Additional Comments does housework, does light yardwork   Prior Function   Level of Independence Independent   Vocation Full time employment   Vocation Requirements on phone at computer 8 hours a day   Leisure exercise 6 days a week   Posture/Postural Control   Posture Comments fwd head, rounded shoulders, very gaurded with motions  AROM   Overall AROM Comments cervical ROM decreased 75%, shoulder flexion decreased to 80 degrees, she reports MD told her not to reach high or lift over head,, unable to reach C7 with either hand, unable to reach L2 with either hand   Strength   Overall Strength Comments shoulder strength 3+/5   Flexibility   Soft Tissue Assessment /Muscle Length --  some neural tension bilaterally   Palpation   Palpation comment very tight and gaurded int he upper traps and in the cervical area                   [facility] Adult PT Treatment/Exercise - 01/30/15 0001    Modalities   Modalities Moist Heat;Electrical Stimulation   Moist Heat Therapy   Number Minutes Moist Heat 15 Minutes   Moist Heat Location Cervical   Electrical Stimulation    Electrical Stimulation Location cervical area   Electrical Stimulation Action IFC   Electrical Stimulation Parameters tolerance   Electrical Stimulation Goals Pain                PT Education - 01/30/15 1605    Education provided Yes   Education Details shoulder shrugs, shoulder elevation and gentle median nerve stretch   Person(s) Educated Patient   Methods Explanation;Demonstration;Handout   Comprehension Verbalized understanding          PT Short Term Goals - 01/30/15 1616    PT SHORT TERM GOAL #1   Title independent with initial HEP   Time 1   Period Weeks   Status New           PT Long Term Goals - 01/30/15 1616    PT LONG TERM GOAL #1   Title understand proper posture and body mechanics   Time 8   Period Weeks   Status New   PT LONG TERM GOAL #2   Title decrease pain 50%   Time 8   Period Weeks   Status New   PT LONG TERM GOAL #3   Title increase cervical ROM 25%   Time 8   Period Weeks   Status New   PT LONG TERM GOAL #4   Title be able to tolerate sitting > 1 hour   Time 8   Period Weeks   Status New               Plan - 01/30/15 1609    Clinical Impression Statement Patient with a 3 level ACDF on 12/12/14, she reports that she has had pain since that time and has been walking for exercise.  She is very anxious and reports that she has a great deall of difficulty relaxing.  Her shoulder and cervcical ROM is greately limited.  She has positive neural  tension signs.  She has some wekaness of the arms, significant spasms.  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  She reports that she cannot sit > 30 minutes without pain > 8/10   Pt will benefit from skilled therapeutic intervention in order to improve on the following deficits Decreased activity tolerance;Decreased range of motion;Decreased strength;Increased fascial restricitons;Increased muscle spasms;Impaired flexibility;Postural dysfunction;Improper body mechanics;Pain    Rehab Potential Good   PT Frequency 3x / week   PT Duration 6 weeks   PT Treatment/Interventions ADLs/Self Care Home Management;Electrical Stimulation;Moist Heat;Therapeutic exercise;Patient/family education;Manual techniques;Taping;Passive range of motion   PT Next Visit Plan slowly add exercises and flexibility training   Consulted and Agree with  Plan of Care Patient         Problem List Patient Active Problem List   Diagnosis Date Noted  . Radiculopathy 12/12/2014  . Anemia due to other cause 10/11/2014  . Substance abuse in remission 08/28/2014  . Chronic hepatitis C without hepatic coma ([place]) 07/06/2014  . MRSA PNEUMONIA 10/01/2008  . MUSCULOSKELETAL PAIN 09/27/2008  . ALLERGIC RHINITIS 04/12/2008  . CELLULITIS AND ABSCESS OF [place] 04/12/2008    Sumner Boast., PT 01/30/2015, 4:27 PM  [facility] [address] [place], [place], [address] Phone: [phone]   Fax:  [phone]  Name: JIMISHA ASKELAND MRN: [id number] Date of Birth: 05/17/1955

## 2015-01-31 MED FILL — BUPROPION HCL XL 150 MG TAB: 150 | 30 days supply | Qty: 30 | Fill #2

## 2015-02-01 ENCOUNTER — Ambulatory Visit: Payer: 59 | Admitting: Physical Therapy

## 2015-02-01 ENCOUNTER — Encounter: Payer: Self-pay | Admitting: Physical Therapy

## 2015-02-01 DIAGNOSIS — M436 Torticollis: Secondary | ICD-10-CM

## 2015-02-01 DIAGNOSIS — M542 Cervicalgia: Secondary | ICD-10-CM

## 2015-02-01 MED FILL — HYDROCODON-APAP 7.5-325: 7.5-325 | 10 days supply | Qty: 40 | Fill #0

## 2015-02-01 NOTE — Therapy (Signed)
[facility] [place], [place], [address] Phone: [phone]   Fax:  [phone]  Physical Therapy Treatment  Patient Details  Name: [name] MRN: [id number] Date of Birth: [birth year] Referring Provider: Lynann Bologna  Encounter Date: 02/01/2015      PT End of Session - 02/01/15 1002    Visit Number 2   Date for PT Re-Evaluation 03/30/15   PT Start Time 0930   PT Stop Time 1020   PT Time Calculation (min) 50 min      Past Medical History  Diagnosis Date  . Osteoporosis   . Depression   . Headache   . Arthritis   . Prosthetic eye globe     Past Surgical History  Procedure Laterality Date  . Enucleation Right 2013    eye infection  . Corneal transplant Right 2007    x4  . Rhinoplasty  1980  . Appendectomy  1974  . Anterior cervical decomp/discectomy fusion N/A 12/12/2014    Procedure: ANTERIOR CERVICAL DECOMPRESSION/DISCECTOMY FUSION 3 LEVELS;  Surgeon: Phylliss Bob, MD;  Location: [place] [facility];  Service: Orthopedics;  Laterality: N/A;  Anterior cervical decompression fusion, cerivcal 5-6, cervical 6-7, cervical 7-thoracic 1 with instrumentation and allografrt    There were no vitals filed for this visit.  Visit Diagnosis:  Neck pain  Neck stiffness      Subjective Assessment - 02/01/15 0934    Subjective hard to get going in the morning, doing HEP   Currently in Pain? Yes   Pain Score 7    Pain Location Neck                         [facility] Adult PT Treatment/Exercise - 02/01/15 0001    Exercises   Exercises Neck;Shoulder   Neck Exercises: Machines for Strengthening   UBE (Upper Arm Bike) L 2 2 fwd/2 back   Neck Exercises: Standing   Neck Retraction 10 reps  head on ball   Wall Push Ups 10 reps  with ball   Shoulder Exercises: Standing   Extension Strengthening;Both;10 reps;Theraband   Theraband Level (Shoulder Extension) Level 2 (Red);Level 1 (Yellow)   Row  Strengthening;Both;10 reps;Theraband   Theraband Level (Shoulder Row) Level 1 (Yellow)   Other Standing Exercises roll ball up/down wall 10 times   Other Standing Exercises 3# shruggs,backward rolls and scap squeeze 10 time seach   Modalities   Modalities Moist Heat;Electrical Stimulation   Moist Heat Therapy   Number Minutes Moist Heat 15 Minutes   Moist Heat Location Cervical   Electrical Stimulation   Electrical Stimulation Location cervical area   Electrical Stimulation Action IFC   Electrical Stimulation Goals Pain   Manual Therapy   Manual Therapy Soft tissue mobilization;Neural Stretch   Manual therapy comments large trigger ptds in RT trap and bilateral rhomboids, responded well to MT   Neural Stretch positive bilateral UE                PT Education - 02/01/15 1001    Education provided Yes   Education Details discussed relaxation and mvmt   Person(s) Educated Patient   Methods Explanation;Demonstration   Comprehension Verbalized understanding;Returned demonstration          PT Short Term Goals - 02/01/15 1003    PT SHORT TERM GOAL #1   Title independent with initial HEP   Status Achieved  PT Long Term Goals - 01/30/15 1616    PT LONG TERM GOAL #1   Title understand proper posture and body mechanics   Time 8   Period Weeks   Status New   PT LONG TERM GOAL #2   Title decrease pain 50%   Time 8   Period Weeks   Status New   PT LONG TERM GOAL #3   Title increase cervical ROM 25%   Time 8   Period Weeks   Status New   PT LONG TERM GOAL #4   Title be able to tolerate sitting > 1 hour   Time 8   Period Weeks   Status New               Plan - 02/01/15 1002    Clinical Impression Statement pt very anxious and c/o pain but motivated to get moving, discussed need for mvmt and muscle relaxtion with mvmt. Pt verb agreement. Tolerated well with MT.   PT Next Visit Plan slowly add exercises and flexibility training         Problem List Patient Active Problem List   Diagnosis Date Noted  . Radiculopathy 12/12/2014  . Anemia due to other cause 10/11/2014  . Substance abuse in remission 08/28/2014  . Chronic hepatitis C without hepatic coma ([place]) 07/06/2014  . MRSA PNEUMONIA 10/01/2008  . MUSCULOSKELETAL PAIN 09/27/2008  . ALLERGIC RHINITIS 04/12/2008  . CELLULITIS AND ABSCESS OF FACE 04/12/2008    Alvin Rubano,ANGIE PTA 02/01/2015, 10:05 AM  [facility] [place], [place], [address] Phone: [phone]   Fax:  [phone]  Name: [name] MRN: [id number] Date of Birth: [birth year]

## 2015-02-06 ENCOUNTER — Ambulatory Visit: Payer: 59 | Admitting: Physical Therapy

## 2015-02-06 ENCOUNTER — Encounter: Payer: Self-pay | Admitting: Physical Therapy

## 2015-02-06 DIAGNOSIS — M542 Cervicalgia: Secondary | ICD-10-CM

## 2015-02-06 DIAGNOSIS — M436 Torticollis: Secondary | ICD-10-CM | POA: Diagnosis not present

## 2015-02-06 DIAGNOSIS — K5900 Constipation, unspecified: Secondary | ICD-10-CM | POA: Diagnosis not present

## 2015-02-06 DIAGNOSIS — Z6820 Body mass index (BMI) 20.0-20.9, adult: Secondary | ICD-10-CM | POA: Diagnosis not present

## 2015-02-06 DIAGNOSIS — M545 Low back pain: Secondary | ICD-10-CM | POA: Diagnosis not present

## 2015-02-06 DIAGNOSIS — B182 Chronic viral hepatitis C: Secondary | ICD-10-CM | POA: Diagnosis not present

## 2015-02-06 DIAGNOSIS — R531 Weakness: Secondary | ICD-10-CM | POA: Diagnosis not present

## 2015-02-06 DIAGNOSIS — Z0001 Encounter for general adult medical examination with abnormal findings: Secondary | ICD-10-CM | POA: Diagnosis not present

## 2015-02-06 MED FILL — diazePAM 5 MG TABS: 5 | 7 days supply | Qty: 30 | Fill #0

## 2015-02-06 MED FILL — POLYETHYLENE GLYCOL 3350 PO: 90 days supply | Qty: 3162 | Fill #2

## 2015-02-06 NOTE — Therapy (Signed)
[facility] [address] [facility] [place], [place], [address] Phone: [phone]   Fax:  [phone]  Physical Therapy Treatment  Patient Details  Name: [name] MRN: [id number] Date of Birth: 12-31-1955 Referring Provider: Lynann Bologna  Encounter Date: 02/06/2015      PT End of Session - 02/06/15 1145    Visit Number 3   Date for PT Re-Evaluation 03/30/15   PT Start Time 1103   PT Stop Time 1159   PT Time Calculation (min) 56 min   Activity Tolerance Patient tolerated treatment well;Patient limited by pain   Behavior During Therapy [facility] for tasks assessed/performed      Past Medical History  Diagnosis Date  . Osteoporosis   . Depression   . Headache   . Arthritis   . Prosthetic eye globe     Past Surgical History  Procedure Laterality Date  . Enucleation Right 2013    eye infection  . Corneal transplant Right 2007    x4  . Rhinoplasty  1980  . Appendectomy  1974  . Anterior cervical decomp/discectomy fusion N/A 12/12/2014    Procedure: ANTERIOR CERVICAL DECOMPRESSION/DISCECTOMY FUSION 3 LEVELS;  Surgeon: Phylliss Bob, MD;  Location: [place];  Service: Orthopedics;  Laterality: N/A;  Anterior cervical decompression fusion, cerivcal 5-6, cervical 6-7, cervical 7-thoracic 1 with instrumentation and allografrt    There were no vitals filed for this visit.  Visit Diagnosis:  Neck stiffness  Neck pain      Subjective Assessment - 02/06/15 1103    Subjective Pt reports that things have been doing good, "I want to go back to the gym", Pt reports that she has started back at the gym working her legs.    Currently in Pain? No/denies   Pain Score 0-No pain                         [facility] Adult PT Treatment/Exercise - 02/06/15 0001    Neck Exercises: Machines for Strengthening   UBE (Upper Arm Bike) L 2 3 fwd/3 back   Shoulder Exercises: Standing   External Rotation 10 reps;Both   Theraband  Level (Shoulder External Rotation) Level 1 (Yellow)   Extension Strengthening;Both;10 reps;Theraband  2 sets    Theraband Level (Shoulder Extension) Level 2 (Red)   Row Strengthening;Both;10 reps;Theraband  2 sets    Theraband Level (Shoulder Row) Level 2 (Red)   Other Standing Exercises roll ball up/down wall 10 times   Other Standing Exercises 3# shrugs 2x10   Modalities   Modalities Moist Heat;Electrical Stimulation   Moist Heat Therapy   Number Minutes Moist Heat 15 Minutes   Moist Heat Location Cervical   Electrical Stimulation   Electrical Stimulation Location cervical area   Electrical Stimulation Action IFC   Electrical Stimulation Goals Pain   Manual Therapy   Manual Therapy Soft tissue mobilization;Neural Stretch;Muscle Energy Technique;Passive ROM   Manual therapy comments large trigger pts in RT trap and bilateral rhomboids, responded well to MT   Soft tissue mobilization cervical para spinales, upper traps    Passive ROM Cervical motions all directions gentle                   PT Short Term Goals - 02/01/15 1003    PT SHORT TERM GOAL #1   Title independent with initial HEP   Status Achieved           PT Long Term  Goals - 02/06/15 1150    PT LONG TERM GOAL #2   Title decrease pain 50%   Status On-going   PT LONG TERM GOAL #3   Title increase cervical ROM 25%   Status On-going               Plan - 02/06/15 1146    Clinical Impression Statement Pt continues to be motivated to keep moving and reports decrease pain. Tolerated additional exercises this date compared to last treatment as well as additional MT. Pt reports that she has returned to the gym to work on her legs.    Pt will benefit from skilled therapeutic intervention in order to improve on the following deficits Decreased activity tolerance;Decreased range of motion;Decreased strength;Increased fascial restricitons;Increased muscle spasms;Impaired flexibility;Postural  dysfunction;Improper body mechanics;Pain   PT Frequency 3x / week   PT Duration 6 weeks   PT Treatment/Interventions ADLs/Self Care Home Management;Electrical Stimulation;Moist Heat;Therapeutic exercise;Patient/family education;Manual techniques;Taping;Passive range of motion   PT Next Visit Plan continue to slowly add exercises and flexibility training        Problem List Patient Active Problem List   Diagnosis Date Noted  . Radiculopathy 12/12/2014  . Anemia due to other cause 10/11/2014  . Substance abuse in remission 08/28/2014  . Chronic hepatitis C without hepatic coma ([place]) 07/06/2014  . MRSA PNEUMONIA 10/01/2008  . MUSCULOSKELETAL PAIN 09/27/2008  . ALLERGIC RHINITIS 04/12/2008  . CELLULITIS AND ABSCESS OF FACE 04/12/2008    Scot Jun, PTA  02/06/2015, 11:52 AM  [facility] [place], [place], [address] Phone: [phone]   Fax:  [phone]  Name: [name] MRN: [id number] Date of Birth: Jul 24, 1955

## 2015-02-08 ENCOUNTER — Ambulatory Visit: Payer: 59 | Admitting: Physical Therapy

## 2015-02-08 ENCOUNTER — Encounter: Payer: Self-pay | Admitting: Physical Therapy

## 2015-02-08 DIAGNOSIS — M542 Cervicalgia: Secondary | ICD-10-CM

## 2015-02-08 DIAGNOSIS — M436 Torticollis: Secondary | ICD-10-CM

## 2015-02-08 NOTE — Therapy (Signed)
[place], [place], [address] Phone: [phone]   Fax:  [phone]  Physical Therapy Treatment  Patient Details  Name: [name] MRN: [id number] Date of Birth: 1955-06-22 Referring Provider: Lynann Bologna  Encounter Date: 02/08/2015      PT End of Session - 02/08/15 1004    Visit Number 4   Date for PT Re-Evaluation 03/30/15   PT Start Time 0930   PT Stop Time 1025   PT Time Calculation (min) 55 min      Past Medical History  Diagnosis Date  . Osteoporosis   . Depression   . Headache   . Arthritis   . Prosthetic eye globe     Past Surgical History  Procedure Laterality Date  . Enucleation Right 2013    eye infection  . Corneal transplant Right 2007    x4  . Rhinoplasty  1980  . Appendectomy  1974  . Anterior cervical decomp/discectomy fusion N/A 12/12/2014    Procedure: ANTERIOR CERVICAL DECOMPRESSION/DISCECTOMY FUSION 3 LEVELS;  Surgeon: Phylliss Bob, MD;  Location: [facility];  Service: Orthopedics;  Laterality: N/A;  Anterior cervical decompression fusion, cerivcal 5-6, cervical 6-7, cervical 7-thoracic 1 with instrumentation and allografrt    There were no vitals filed for this visit.  Visit Diagnosis:  Neck stiffness  Neck pain      Subjective Assessment - 02/08/15 0933    Subjective felt better after last session, seeing MD monday and hoping to go back to work 4 hours a day   Currently in Pain? Yes   Pain Score 5    Pain Location Neck            [facility] PT Assessment - 02/08/15 0001    AROM   Overall AROM Comments cev ROM flex decreased 50%,extWFLS,Lt rot decreased 50%, RT rot decreased 25 %,Left SB, decreased 75%RT SB decreased 75%                     [facility] Adult PT Treatment/Exercise - 02/08/15 0001    Neck Exercises: Machines for Strengthening   UBE (Upper Arm Bike) L 2 3 fwd/3 back  okayed to do at gym   Other Machines for Strengthening  Nustep L3 3 min  okay to do at gym   Neck Exercises: Supine   Other Supine Exercise head on ball cerv retraction and ROM   Other Supine Exercise 1# UE ex in supine with head on ball 10 reps 3 ways   Shoulder Exercises: Standing   Other Standing Exercises yellow tband shld ext,retraction and abd 10 times each   Moist Heat Therapy   Number Minutes Moist Heat 15 Minutes   Moist Heat Location Cervical   Electrical Stimulation   Electrical Stimulation Location cervical area   Electrical Stimulation Action IFC   Electrical Stimulation Goals Pain   Manual Therapy   Manual Therapy Soft tissue mobilization;Passive ROM;Manual Traction   Manual therapy comments STW cerv,trap and rhom   Passive ROM cerv with facsiliatation to kep traps down   Manual Traction cervical                PT Education - 02/08/15 1003    Education provided Yes   Education Details okay to add UBE and Nustep at gym but NO weights yet   Person(s) Educated Patient   Methods Explanation;Demonstration   Comprehension Verbalized understanding;Returned demonstration  PT Short Term Goals - 02/01/15 1003    PT SHORT TERM GOAL #1   Title independent with initial HEP   Status Achieved           PT Long Term Goals - 02/08/15 1003    PT LONG TERM GOAL #1   Title understand proper posture and body mechanics   Status On-going   PT LONG TERM GOAL #2   Title decrease pain 50%   Status On-going   PT LONG TERM GOAL #3   Title increase cervical ROM 25%   Status Partially Met   PT LONG TERM GOAL #4   Title be able to tolerate sitting > 1 hour   Status On-going               Plan - 02/08/15 1004    Clinical Impression Statement pt with less guarding in cerv area with some improvement in ROM, still requires VCing with ther ex as pt wants to shrugg shlds. Pt responds well to MT   PT Next Visit Plan continue to slowly add exercises and flexibility training. WRITE MD note        Problem  List Patient Active Problem List   Diagnosis Date Noted  . Radiculopathy 12/12/2014  . Anemia due to other cause 10/11/2014  . Substance abuse in remission 08/28/2014  . Chronic hepatitis C without hepatic coma ([place]) 07/06/2014  . MRSA PNEUMONIA 10/01/2008  . MUSCULOSKELETAL PAIN 09/27/2008  . ALLERGIC RHINITIS 04/12/2008  . CELLULITIS AND ABSCESS OF FACE 04/12/2008    Nadezhda Pollitt,ANGIE PTA 02/08/2015, 10:06 AM  [place] [address] [place], [place], [address] Phone: [phone]   Fax:  [phone]  Name: [name] MRN: [id number] Date of Birth: 09-10-1955

## 2015-02-11 ENCOUNTER — Ambulatory Visit: Payer: 59 | Admitting: Physical Therapy

## 2015-02-11 ENCOUNTER — Encounter: Payer: Self-pay | Admitting: Physical Therapy

## 2015-02-11 DIAGNOSIS — M542 Cervicalgia: Secondary | ICD-10-CM | POA: Diagnosis not present

## 2015-02-11 DIAGNOSIS — M4802 Spinal stenosis, cervical region: Secondary | ICD-10-CM | POA: Diagnosis not present

## 2015-02-11 DIAGNOSIS — M436 Torticollis: Secondary | ICD-10-CM

## 2015-02-11 MED FILL — HYDROCODON-APAP 7.5-325: 7.5-325 | 10 days supply | Qty: 40 | Fill #0

## 2015-02-11 NOTE — Therapy (Signed)
[place], [place], [address] Phone: [phone]   Fax:  [phone]  Physical Therapy Treatment  Patient Details  Name: [name] MRN: [id number] Date of Birth: 03-22-1955 Referring Provider: Lynann Bologna  Encounter Date: 02/11/2015      PT End of Session - 02/11/15 1014    Visit Number 5   Date for PT Re-Evaluation 03/30/15   PT Start Time 0935   PT Stop Time 1014   PT Time Calculation (min) 39 min   Activity Tolerance Patient tolerated treatment well;Patient limited by pain   Behavior During Therapy [facility] for tasks assessed/performed      Past Medical History  Diagnosis Date  . Osteoporosis   . Depression   . Headache   . Arthritis   . Prosthetic eye globe     Past Surgical History  Procedure Laterality Date  . Enucleation Right 2013    eye infection  . Corneal transplant Right 2007    x4  . Rhinoplasty  1980  . Appendectomy  1974  . Anterior cervical decomp/discectomy fusion N/A 12/12/2014    Procedure: ANTERIOR CERVICAL DECOMPRESSION/DISCECTOMY FUSION 3 LEVELS;  Surgeon: Phylliss Bob, MD;  Location: [facility];  Service: Orthopedics;  Laterality: N/A;  Anterior cervical decompression fusion, cerivcal 5-6, cervical 6-7, cervical 7-thoracic 1 with instrumentation and allografrt    There were no vitals filed for this visit.  Visit Diagnosis:  Neck pain  Neck stiffness      Subjective Assessment - 02/11/15 0936    Subjective "I wake up in a lot of pain in my neck. " "Mornings and afternoon are my worst." Pt reports that she has a MD appointment this afternoon to see if he is ok with her working 4 hours a day.   Currently in Pain? Yes   Pain Score 5    Pain Location Neck            [facility] PT Assessment - 02/11/15 0001    AROM   Overall AROM Comments cev ROM flex decreased 25%,extWFLS,Lt rot decreased 25%, RT rot decreased 25 %,Left SB, decreased 50%RT SB decreased 50%                     [facility] Adult PT Treatment/Exercise - 02/11/15 0001    Neck Exercises: Machines for Strengthening   UBE (Upper Arm Bike) L 2 3 fwd/3 back   Other Machines for Strengthening Tmill 1.8 mph x 4 minutes working on relaxing and arm swing   Neck Exercises: Supine   Other Supine Exercise head on ball cerv retraction and ROM   Other Supine Exercise 1# UE ex in supine with head on ball 10 reps 3 ways   Shoulder Exercises: Standing   Other Standing Exercises yellow tband shld ext,retraction and abd 10 times each   Other Standing Exercises 3# shruggs 2x10   Manual Therapy   Manual Therapy Soft tissue mobilization;Passive ROM;Manual Traction   Manual therapy comments STW cerv,trap and rhom   Passive ROM cerv with facsiliatation to kep traps down   Manual Traction cervical                  PT Short Term Goals - 02/11/15 1021    PT SHORT TERM GOAL #1   Title independent with initial HEP   Status Achieved           PT Long Term Goals - 02/11/15 1021  PT LONG TERM GOAL #2   Title decrease pain 50%   PT LONG TERM GOAL #3   Title increase cervical ROM 25%   Status Achieved               Plan - 02/11/15 1014    Clinical Impression Statement pt with improve cervical ROM after MT. Pt express that she would like to try and go back to work 4 hours a day. Pt will see MD this afternoon to get permission to go back to work. Pt abdived to raise computer screen. No issues with exercises this day.   Pt will benefit from skilled therapeutic intervention in order to improve on the following deficits Decreased activity tolerance;Decreased range of motion;Decreased strength;Increased fascial restricitons;Increased muscle spasms;Impaired flexibility;Postural dysfunction;Improper body mechanics;Pain   Rehab Potential Good   PT Frequency 3x / week   PT Duration 6 weeks   PT Treatment/Interventions ADLs/Self Care Home Management;Electrical Stimulation;Moist  Heat;Therapeutic exercise;Patient/family education;Manual techniques;Taping;Passive range of motion   PT Next Visit Plan continue to slowly add exercises and flexibility training        Problem List Patient Active Problem List   Diagnosis Date Noted  . Radiculopathy 12/12/2014  . Anemia due to other cause 10/11/2014  . Substance abuse in remission 08/28/2014  . Chronic hepatitis C without hepatic coma ([place]) 07/06/2014  . MRSA PNEUMONIA 10/01/2008  . MUSCULOSKELETAL PAIN 09/27/2008  . ALLERGIC RHINITIS 04/12/2008  . CELLULITIS AND ABSCESS OF FACE 04/12/2008    Scot Jun, PTA  02/11/2015, 10:23 AM  [place], [place], [address] Phone: [phone]   Fax:  [phone]  Name: LUCAS AURE MRN: [id number] Date of Birth: 09-Nov-1955

## 2015-02-13 ENCOUNTER — Encounter: Payer: Self-pay | Admitting: Physical Therapy

## 2015-02-13 ENCOUNTER — Ambulatory Visit: Payer: 59 | Admitting: Physical Therapy

## 2015-02-13 DIAGNOSIS — M542 Cervicalgia: Secondary | ICD-10-CM

## 2015-02-13 DIAGNOSIS — M436 Torticollis: Secondary | ICD-10-CM

## 2015-02-13 NOTE — Therapy (Signed)
[place], [place], [address] Phone: [phone]   Fax:  [phone]  Physical Therapy Treatment  Patient Details  Name: [name] MRN: [id number] Date of Birth: 1955/05/31 Referring Provider: Lynann Bologna  Encounter Date: 02/13/2015      PT End of Session - 02/13/15 1016    Visit Number 6   Date for PT Re-Evaluation 03/30/15   PT Start Time 0931   PT Stop Time 1016   PT Time Calculation (min) 45 min   Activity Tolerance Patient tolerated treatment well   Behavior During Therapy [facility] for tasks assessed/performed      Past Medical History  Diagnosis Date  . Osteoporosis   . Depression   . Headache   . Arthritis   . Prosthetic eye globe     Past Surgical History  Procedure Laterality Date  . Enucleation Right 2013    eye infection  . Corneal transplant Right 2007    x4  . Rhinoplasty  1980  . Appendectomy  1974  . Anterior cervical decomp/discectomy fusion N/A 12/12/2014    Procedure: ANTERIOR CERVICAL DECOMPRESSION/DISCECTOMY FUSION 3 LEVELS;  Surgeon: Phylliss Bob, MD;  Location: [facility];  Service: Orthopedics;  Laterality: N/A;  Anterior cervical decompression fusion, cerivcal 5-6, cervical 6-7, cervical 7-thoracic 1 with instrumentation and allografrt    There were no vitals filed for this visit.  Visit Diagnosis:  Neck stiffness  Neck pain      Subjective Assessment - 02/13/15 0931    Subjective Pt reports that she is feeling better. Did go back to work yesterday for 4 hours,  but felt bad last night. Pt reports that work went well, MD recommended 4 hours of work 4 weeks   Currently in Pain? Yes   Pain Score 3    Pain Location Neck                         [facility] Adult PT Treatment/Exercise - 02/13/15 0001    Neck Exercises: Machines for Strengthening   UBE (Upper Arm Bike) L 2 3 fwd/3 back   Other Machines for Strengthening Tmill 1.8 mph x 4 minutes  working on relaxing and arm swing   Shoulder Exercises: Standing   Extension 10 reps;Theraband  3 set    Theraband Level (Shoulder Extension) Level 2 (Red)   Other Standing Exercises OHP with yellow ball 3x10; standing rowx #20 3x10   Shoulder Exercises: Power [employer] 10 reps  2 sets    Row Limitations 20lb   Other Power [employer] Exercises Lat pull downs #15 3x10   Manual Therapy   Manual Therapy Soft tissue mobilization;Passive ROM;Manual Traction   Manual therapy comments STW cerv,trap and rhom   Passive ROM cervical all directions    Manual Traction cervical                  PT Short Term Goals - 02/11/15 1021    PT SHORT TERM GOAL #1   Title independent with initial HEP   Status Achieved           PT Long Term Goals - 02/11/15 1021    PT LONG TERM GOAL #2   Title decrease pain 50%   PT LONG TERM GOAL #3   Title increase cervical ROM 25%   Status Achieved               Plan -  02/13/15 1017    Clinical Impression Statement Continues to perform well in PT. Pt very pleased to progress to machine level interventions. Pt reports that she has returned to work with 4 hour days. Positive response to MT.    Pt will benefit from skilled therapeutic intervention in order to improve on the following deficits Decreased activity tolerance;Decreased range of motion;Decreased strength;Increased fascial restricitons;Increased muscle spasms;Impaired flexibility;Postural dysfunction;Improper body mechanics;Pain   Rehab Potential Good   PT Frequency 3x / week   PT Duration 6 weeks   PT Treatment/Interventions ADLs/Self Care Home Management;Electrical Stimulation;Moist Heat;Therapeutic exercise;Patient/family education;Manual techniques;Taping;Passive range of motion   PT Next Visit Plan continue to slowly add exercises and flexibility training        Problem List Patient Active Problem List   Diagnosis Date Noted  . Radiculopathy 12/12/2014  . Anemia due to  other cause 10/11/2014  . Substance abuse in remission 08/28/2014  . Chronic hepatitis C without hepatic coma ([place]) 07/06/2014  . MRSA PNEUMONIA 10/01/2008  . MUSCULOSKELETAL PAIN 09/27/2008  . ALLERGIC RHINITIS 04/12/2008  . CELLULITIS AND ABSCESS OF FACE 04/12/2008    Scot Jun, PTA  02/13/2015, 10:21 AM  [place], [place], [address] Phone: [phone]   Fax:  [phone]  Name: [name] MRN: [id number] Date of Birth: Jun 27, 1955

## 2015-02-15 ENCOUNTER — Ambulatory Visit: Payer: 59 | Admitting: Physical Therapy

## 2015-02-18 ENCOUNTER — Ambulatory Visit: Payer: 59 | Admitting: Physical Therapy

## 2015-02-18 ENCOUNTER — Encounter: Payer: Self-pay | Admitting: Physical Therapy

## 2015-02-18 DIAGNOSIS — M436 Torticollis: Secondary | ICD-10-CM | POA: Diagnosis not present

## 2015-02-18 DIAGNOSIS — M542 Cervicalgia: Secondary | ICD-10-CM

## 2015-02-18 NOTE — Therapy (Addendum)
[place], [place], [address] Phone: [phone]   Fax:  [phone]  Physical Therapy Treatment  Patient Details  Name: [name] MRN: [id number] Date of Birth: 1955-05-08 Referring Provider: Lynann Bologna  Encounter Date: 02/18/2015      PT End of Session - 02/18/15 1013    Visit Number 7   Date for PT Re-Evaluation 03/30/15   PT Start Time 0932   PT Stop Time 1026   PT Time Calculation (min) 54 min   Activity Tolerance Patient tolerated treatment well   Behavior During Therapy [facility] for tasks assessed/performed      Past Medical History  Diagnosis Date  . Osteoporosis   . Depression   . Headache   . Arthritis   . Prosthetic eye globe     Past Surgical History  Procedure Laterality Date  . Enucleation Right 2013    eye infection  . Corneal transplant Right 2007    x4  . Rhinoplasty  1980  . Appendectomy  1974  . Anterior cervical decomp/discectomy fusion N/A 12/12/2014    Procedure: ANTERIOR CERVICAL DECOMPRESSION/DISCECTOMY FUSION 3 LEVELS;  Surgeon: Phylliss Bob, MD;  Location: [facility];  Service: Orthopedics;  Laterality: N/A;  Anterior cervical decompression fusion, cerivcal 5-6, cervical 6-7, cervical 7-thoracic 1 with instrumentation and allografrt    There were no vitals filed for this visit.  Visit Diagnosis:  Neck pain  Neck stiffness      Subjective Assessment - 02/18/15 0937    Subjective "I have not been feeling well this weekend" "Thursday I was feeling my best" Pt skipped Pt session Friday because she was doing so well.    Currently in Pain? Yes   Pain Score 8    Pain Location Neck                         [facility] Adult PT Treatment/Exercise - 02/18/15 0001    Neck Exercises: Machines for Strengthening   UBE (Upper Arm Bike) L 3 3 fwd/3 back   Other Machines for Strengthening Tmill 1.8 mph x 3 minutes working on relaxing and arm swing   Neck  Exercises: Supine   Other Supine Exercise head on ball cerv retraction 3 way   Shoulder Exercises: Standing   Extension 10 reps;Theraband  3 sets   Theraband Level (Shoulder Extension) Level 2 (Red)   Other Standing Exercises OHP with yellow ball 3x10; standing rows #20 3x10   Shoulder Exercises: Power [employer] 10 reps  2 sets    Row Limitations 20lb   Other Power [employer] Exercises Lat pull downs #15 3x10   Moist Heat Therapy   Number Minutes Moist Heat 15 Minutes   Moist Heat Location Cervical   Electrical Stimulation   Electrical Stimulation Location cervical area   Electrical Stimulation Action IFC   Electrical Stimulation Goals Pain   Manual Therapy   Manual Therapy Soft tissue mobilization;Passive ROM;Manual Traction   Manual therapy comments STW cerv,trap and rhom   Passive ROM cervical all directions    Manual Traction cervical                  PT Short Term Goals - 02/11/15 1021    PT SHORT TERM GOAL #1   Title independent with initial HEP   Status Achieved           PT Long Term Goals - 02/11/15 1021  PT LONG TERM GOAL #2   Title decrease pain 50%   PT LONG TERM GOAL #3   Title increase cervical ROM 25%   Status Achieved               Plan - 02/18/15 1015    Clinical Impression Statement Pt performed all interventions well this date. Pt cancelled fridays PT session because she was doing well. Pt repots going to the gym Friday and Saturday, and she continues to work $ hour days. Pt encouraged not to do too much too soon. Pt reports pain all weekend.   Pt will benefit from skilled therapeutic intervention in order to improve on the following deficits Decreased activity tolerance;Decreased range of motion;Decreased strength;Increased fascial restricitons;Increased muscle spasms;Impaired flexibility;Postural dysfunction;Improper body mechanics;Pain   Rehab Potential Good   PT Frequency 3x / week   PT Duration 6 weeks   PT  Treatment/Interventions ADLs/Self Care Home Management;Electrical Stimulation;Moist Heat;Therapeutic exercise;Patient/family education;Manual techniques;Taping;Passive range of motion   PT Next Visit Plan continue to slowly add exercises and flexibility training        Problem List Patient Active Problem List   Diagnosis Date Noted  . Radiculopathy 12/12/2014  . Anemia due to other cause 10/11/2014  . Substance abuse in remission 08/28/2014  . Chronic hepatitis C without hepatic coma ([place]) 07/06/2014  . MRSA PNEUMONIA 10/01/2008  . MUSCULOSKELETAL PAIN 09/27/2008  . ALLERGIC RHINITIS 04/12/2008  . CELLULITIS AND ABSCESS OF FACE 04/12/2008   PHYSICAL THERAPY DISCHARGE SUMMARY  Visits from Start of Care: 7   Plan: Patient agrees to discharge.  Patient goals were partially met. Patient is being discharged due to being pleased with the current functional level.  ?????       Scot Jun, PTA  02/18/2015, 10:21 AM  [place], [place], [address] Phone: [phone]   Fax:  [phone]  Name: JAIANA SHEFFER MRN: [id number] Date of Birth: 1955-07-24

## 2015-02-20 ENCOUNTER — Ambulatory Visit: Payer: 59 | Admitting: Physical Therapy

## 2015-02-22 ENCOUNTER — Ambulatory Visit: Payer: 59 | Admitting: Physical Therapy

## 2015-02-27 MED FILL — CELECOXIB 200 MG CAPSULE: 200 | 30 days supply | Qty: 30 | Fill #0

## 2015-03-01 MED FILL — CYCLOBENZAPRINE 5 MG TABLET: 5 | 20 days supply | Qty: 60 | Fill #1

## 2015-03-01 MED FILL — ALENDRONATE NA 70 MG TAB: 70 | 28 days supply | Qty: 4 | Fill #1

## 2015-03-01 MED FILL — diazePAM 5 MG TABS: 5 | 7 days supply | Qty: 30 | Fill #1

## 2015-03-05 ENCOUNTER — Other Ambulatory Visit: Payer: Self-pay

## 2015-03-06 ENCOUNTER — Other Ambulatory Visit: Payer: 59

## 2015-03-06 DIAGNOSIS — B182 Chronic viral hepatitis C: Secondary | ICD-10-CM

## 2015-03-06 LAB — CBC WITH DIFFERENTIAL/PLATELET
Basophils Absolute: 0.1 10*3/uL (ref 0.0–0.1)
Basophils Relative: 1 % (ref 0–1)
Eosinophils Absolute: 0.1 10*3/uL (ref 0.0–0.7)
Eosinophils Relative: 2 % (ref 0–5)
HCT: 41.3 % (ref 36.0–46.0)
Hemoglobin: 13.7 g/dL (ref 12.0–15.0)
Lymphocytes Relative: 57 % — ABNORMAL HIGH (ref 12–46)
Lymphs Abs: 3.6 10*3/uL (ref 0.7–4.0)
MCH: 31.7 pg (ref 26.0–34.0)
MCHC: 33.2 g/dL (ref 30.0–36.0)
MCV: 95.6 fL (ref 78.0–100.0)
MPV: 10.4 fL (ref 8.6–12.4)
Monocytes Absolute: 0.5 10*3/uL (ref 0.1–1.0)
Monocytes Relative: 8 % (ref 3–12)
Neutro Abs: 2 10*3/uL (ref 1.7–7.7)
Neutrophils Relative %: 32 % — ABNORMAL LOW (ref 43–77)
Platelets: 168 10*3/uL (ref 150–400)
RBC: 4.32 MIL/uL (ref 3.87–5.11)
RDW: 13.5 % (ref 11.5–15.5)
WBC: 6.3 10*3/uL (ref 4.0–10.5)

## 2015-03-07 LAB — HEPATITIS C RNA QUANTITATIVE: HCV Quantitative: NOT DETECTED IU/mL (ref <15)

## 2015-03-08 DIAGNOSIS — M4802 Spinal stenosis, cervical region: Secondary | ICD-10-CM | POA: Diagnosis not present

## 2015-03-08 MED FILL — BUPROPION HCL XL 150 MG TAB: 150 | 30 days supply | Qty: 30 | Fill #0

## 2015-03-08 MED FILL — traMADol HCL 50 MG TABS: 50 | 20 days supply | Qty: 60 | Fill #0

## 2015-03-08 MED FILL — METHOCARBAMOL 500 MG TABLET: 500 | 30 days supply | Qty: 90 | Fill #0

## 2015-03-21 MED FILL — MOMETASONE FUROATE 50 MCG S: 50 | 30 days supply | Qty: 17 | Fill #1

## 2015-03-26 MED FILL — traMADol HCL 50 MG TABS: 50 | 20 days supply | Qty: 60 | Fill #1

## 2015-04-02 ENCOUNTER — Encounter: Payer: Self-pay | Admitting: Internal Medicine

## 2015-04-02 ENCOUNTER — Ambulatory Visit (INDEPENDENT_AMBULATORY_CARE_PROVIDER_SITE_OTHER): Payer: 59 | Admitting: Internal Medicine

## 2015-04-02 VITALS — BP 126/83 | HR 80 | Temp 97.7°F | Wt 106.0 lb

## 2015-04-02 DIAGNOSIS — B182 Chronic viral hepatitis C: Secondary | ICD-10-CM | POA: Diagnosis not present

## 2015-04-02 DIAGNOSIS — K7400 Hepatic fibrosis, unspecified: Secondary | ICD-10-CM | POA: Insufficient documentation

## 2015-04-02 DIAGNOSIS — D6489 Other specified anemias: Secondary | ICD-10-CM | POA: Diagnosis not present

## 2015-04-02 MED FILL — BUPROPION HCL XL 150 MG TAB: 150 | 30 days supply | Qty: 30 | Fill #1

## 2015-04-02 MED FILL — CELECOXIB 200 MG CAPSULE: 200 | 30 days supply | Qty: 30 | Fill #1

## 2015-04-02 NOTE — Assessment & Plan Note (Signed)
Now considered cured.  

## 2015-04-02 NOTE — Assessment & Plan Note (Signed)
I do want to recheck the elastography for comparison and if ok, no follow up needed.  If still some F3 or worse, I do recommend [place] screening with ultrasound every 6 months.

## 2015-04-02 NOTE — Assessment & Plan Note (Signed)
Her anemia has resolved after her ribavirin.

## 2015-04-02 NOTE — Progress Notes (Signed)
   Subjective:    Patient ID: [name], female    DOB: Jun 16, 1955, 60 y.o.   MRN: [id number]  HPI Here for follow up of HCV.   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  On methadone maintenance for drug history and is weaning off of it slowly.  Now has completed [place] with ribavirin for 12 weeks.  Initial labs done by another provider and reviewed.  Had labs 6/28 and was undetectable for virus and now here for [facility] which is actually about 5 months after finishing.  Hgb back up to normal.   Review of Systems  Constitutional: Negative for appetite change.  Gastrointestinal: Negative for nausea and diarrhea.  Skin: Negative for rash.  Neurological: Negative for light-headedness and headaches.       Objective:   Physical Exam  Constitutional: She appears well-developed and well-nourished. No distress.  HENT:  Mouth/Throat: No oropharyngeal exudate.  Eyes: No scleral icterus.  Cardiovascular: Normal rate, regular rhythm and normal heart sounds.   No murmur heard. Pulmonary/Chest: Effort normal and breath sounds normal. No respiratory distress.  Lymphadenopathy:    She has no cervical adenopathy.  Skin: No rash noted.    Social History   Social History  . Marital Status: Single    Spouse Name: N/A  . Number of Children: N/A  . Years of Education: N/A   Occupational History  . Not on file.   Social History Main Topics  . Smoking status: Never Smoker   . Smokeless tobacco: Never Used  . Alcohol Use: No  . Drug Use: No  . Sexual Activity: Not on file   Other Topics Concern  . Not on file   Social History Narrative        Assessment & Plan:

## 2015-04-05 DIAGNOSIS — M533 Sacrococcygeal disorders, not elsewhere classified: Secondary | ICD-10-CM | POA: Diagnosis not present

## 2015-04-16 DIAGNOSIS — M542 Cervicalgia: Secondary | ICD-10-CM | POA: Diagnosis not present

## 2015-04-16 MED FILL — traMADol HCL 50 MG TABS: 50 | 20 days supply | Qty: 60 | Fill #0

## 2015-04-29 MED FILL — BUPROPION HCL XL 150 MG TAB: 150 | 30 days supply | Qty: 30 | Fill #2

## 2015-05-01 MED FILL — CELECOXIB 200 MG CAPSULE: 200 | 30 days supply | Qty: 30 | Fill #2

## 2015-05-09 MED FILL — traMADol HCL 50 MG TABS: 50 | 20 days supply | Qty: 60 | Fill #1

## 2015-05-09 MED FILL — MOMETASONE FUROATE 50 MCG S: 50 | 30 days supply | Qty: 17 | Fill #2

## 2015-05-15 MED FILL — valACYclovir HCL 1 GM TABS: 1 | 90 days supply | Qty: 90 | Fill #0

## 2015-05-24 MED FILL — METHOCARBAMOL 500 MG TABLET: 500 | 30 days supply | Qty: 90 | Fill #0

## 2015-05-28 MED FILL — traMADol HCL 50 MG TABS: 50 | 20 days supply | Qty: 60 | Fill #0

## 2015-06-03 MED FILL — BUPROPION HCL XL 150 MG TAB: 150 | 30 days supply | Qty: 30 | Fill #0

## 2015-06-07 ENCOUNTER — Other Ambulatory Visit: Payer: Self-pay | Admitting: *Deleted

## 2015-06-07 NOTE — Patient Outreach (Signed)
[facility] [facility]) Care Management  06/07/2015  [name] [birth year] [id number]  Subjective:  Telephone call to patient's home number, no answer, left HIPAA compliant voicemail message and requested call back.  Objective: Per chart review, patient completion outpatient rehab on 02/18/15.   Patient has a history of liver fibrosis, anemia, and chronic hepatitis C without hepatic coma.    Patient was hospitalized 12/12/14 -12/13/14  For radiculopathy and had ANTERIOR CERVICAL DECOMPRESSION/DISCECTOMY FUSION 3 LEVELS C5-7 on 12/12/2014.     Assessment: Received [facility] High Risk list referral on 06/03/15.    Plan: RNCM will call patient within 1 week to complete telephone screen, if no return call from patient.  [name] H. Annia Friendly, BSN, [place] Management [facility] Telephonic CM Phone: [phone] Fax: [phone]

## 2015-06-10 ENCOUNTER — Encounter: Payer: Self-pay | Admitting: *Deleted

## 2015-06-10 ENCOUNTER — Other Ambulatory Visit: Payer: Self-pay | Admitting: *Deleted

## 2015-06-10 NOTE — Patient Outreach (Addendum)
[facility]) Care Management  06/10/2015  ADDIS BOBIK 1955-08-23 [id number]   Subjective: Telephone call to patient's home number, no answer, left HIPAA compliant voicemail message and requested call back. Received voicemail message from patient, states she is currently on her lunch break until 1:45pm today, and requested call back prior to end of lunch break if possible.   States she did not received RNCM message until after 5:00pm on 06/07/15 and is unable to have phone out at work.  Telephone call to patient's home number, no answer, left HIPAA compliant voicemail message and requested call back.  Objective: Per chart review, patient completion outpatient rehab on 02/18/15. Patient has a history of liver fibrosis, anemia, and chronic hepatitis C without hepatic coma. Patient was hospitalized 12/12/14 -12/13/14 For radiculopathy and had ANTERIOR CERVICAL DECOMPRESSION/DISCECTOMY FUSION 3 LEVELS C5-7 on 12/12/2014.   Assessment: Received UMR High Risk list referral on 06/03/15.  Telephone screening, pending patient contact.   Plan: RNCM will send patient unsuccessful outreach letter, [facility] pamphlet, and proceed with case closure, if no return call from patient within 10 business days.   Shail Urbas H. Annia Friendly, BSN, [facility] Management [facility] Telephonic CM Phone: [phone] Fax: [phone]

## 2015-06-11 ENCOUNTER — Ambulatory Visit: Payer: Self-pay | Admitting: *Deleted

## 2015-06-11 ENCOUNTER — Other Ambulatory Visit: Payer: Self-pay | Admitting: *Deleted

## 2015-06-11 NOTE — Patient Outreach (Addendum)
[facility]) Care Management  06/11/2015  [name] 05-08-1955 [id number]   Subjective: Received voicemail message from patient, states she is returning RNCM's call, will try to call RNCM back when she gets a break,  and requested call back.    Telephone call to patient's home/ mobile number, no answer, left HIPAA compliant voicemail message, and requested call back.   Received voicemail message from patient, states she will be at lunch for approximately 1 hour, available to talk, and requested call back during this timeframe if possible.  Telephone call to patient's home/ mobile number, spoke with patient, and HIPAA verified.   Discussed [facility] Care Management services and patient in agreement to complete telephone screen.   Patient states she is doing well and does not have any care coordination needs at this time.  Patient states she exercises everyday, weighs 109 lbs, and is 5 feet 2 inches tall.  States she continues to work full time and is managing her disease processes.  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   States she has received a benefit exception in the past for the cleanings and wants to know what is needed for future since she will get this done approximately every 6 months.  RNCM advised will follow up with [place] Management Assistant Director regarding process for future benefit exceptions for ongoing eye prosthetic maintenance.   Patient states she also wears a special contact in left eye due to previous eye infection scarring.  States she also uses reading glasses as needed.  Patient states she is currently having left shoulder pain periodically, which worsens at night, and will follow up with orthopedic MD (Dr. Jacelyn Grip with [facility]) if pain continues  to increase.    Patient states she is healing well from the pinched nerve surgery that was performed in  November of 2016.  States she also receives workers compensation benefits due to a back injury.  Patient states the Hepatitis C has been cleared by Dr. Linus Salmons, treatment completed,  and has a follow up visit with liver ultrasound every 6 months to verify status.   States her next appointment with Dr. Linus Salmons and liver ultrasound is 07/04/15.   Patient in agreement to [place] Management follow up call within 6 weeks.  Patient advised she will receive an unsuccessful outreach letter and program pamphlet from [facility] within 1 week related to previous [place] Management outreach attempts.    Objective: Per chart review, patient completion outpatient rehab on 02/18/15. Patient has a history of liver fibrosis, anemia, and chronic hepatitis C without hepatic coma. Patient was hospitalized 12/12/14 -12/13/14 For radiculopathy and had ANTERIOR CERVICAL DECOMPRESSION/DISCECTOMY FUSION 3 LEVELS C5-7 on 12/12/2014.   Assessment: Received UMR High Risk list referral on 06/03/15. Telephone screening, pending patient contact.    Plan: RNCM will follow up with [place] Management Assistant Director regarding process for future benefit exceptions for ongoing eye prosthetic maintenance within 6 weeks.  RNCM will call patient for telephone outreach within 6 weeks for follow up.   Follow up on outcome of 07/04/15 MD office visit, 07/04/15 ultrasound, and verify date for prosthetic eye cleaning within  6 weeks.  RNCM will determine status of future care coordination needs within 6 weeks at  next patient outreach.    Jaquawn Saffran H. Annia Friendly, BSN, [place] Management [facility] Telephonic CM Phone: [phone] Fax: [phone]

## 2015-06-25 MED FILL — POLYETHYLENE GLYCOL 3350 PO: 90 days supply | Qty: 3162 | Fill #3

## 2015-06-25 MED FILL — CELECOXIB 200 MG CAPSULE: 200 | 30 days supply | Qty: 30 | Fill #3

## 2015-06-28 ENCOUNTER — Ambulatory Visit (INDEPENDENT_AMBULATORY_CARE_PROVIDER_SITE_OTHER): Payer: 59 | Admitting: Family Medicine

## 2015-06-28 ENCOUNTER — Encounter: Payer: Self-pay | Admitting: Family Medicine

## 2015-06-28 VITALS — BP 120/78 | HR 94 | Temp 97.8°F | Ht 62.0 in | Wt 111.0 lb

## 2015-06-28 DIAGNOSIS — M2550 Pain in unspecified joint: Secondary | ICD-10-CM

## 2015-06-28 MED ORDER — CELECOXIB 200 MG PO CAPS: 200.0000 mg | ORAL_CAPSULE | Freq: Two times a day (BID) | ORAL | Status: DC

## 2015-06-28 MED ORDER — HYDROCODONE-ACETAMINOPHEN 10-325 MG PO TABS: 1.0000 | ORAL_TABLET | Freq: Three times a day (TID) | ORAL | Status: DC | PRN

## 2015-06-28 MED ORDER — GABAPENTIN 300 MG PO CAPS: 300.0000 mg | ORAL_CAPSULE | Freq: Three times a day (TID) | ORAL | Status: DC

## 2015-06-28 MED FILL — HYDROCODON-APAP 10-325: 10-325 | 30 days supply | Qty: 90 | Fill #0

## 2015-06-28 MED FILL — GABAPENTIN 300 MG CAPSULE: 300 | 30 days supply | Qty: 90 | Fill #0

## 2015-06-28 NOTE — Progress Notes (Signed)
Pre visit review using our clinic review tool, if applicable. No additional management support is needed unless otherwise documented below in the visit note. 

## 2015-06-28 NOTE — Progress Notes (Signed)
   Subjective:    Patient ID: [name], female    DOB: Sep 07, 1955, 60 y.o.   MRN: [id number]  HPI Here aksing for advice about diffuse joint pains and stiffness. She had a 3 level cervical spine fusion last November per Dr. Lynann Bologna , and this seemed to go well. She went to PT for a while. She continued to have neck pain however as well as frequent headaches in the back of the head which sound like tension headaches. Then she also developed pain in the left shoulder and saw Dr. Dorna Leitz for that. An MRI revealed bone spurs and at first he was recommending surgery for it. She did not want to undergo surgery again however so he did several steroid injections to the shoulder. These helped briefly but the pain always returned. Now it is severe and she cannot raise her arm up to shoulder level. Over the past few months she says she has developed pain throughout all her joints, including knees, hips, ankles and wrists. The pain is severe at times and make it difficult for her to work or to sleep at night. She notes her mother and grandmother suffered from severe arthritis. She is currently taking Celbrex 200 mg once a day and prn Robaxin.    Review of Systems  Constitutional: Negative.   Respiratory: Negative.   Cardiovascular: Negative.   Musculoskeletal: Positive for back pain, arthralgias, neck pain and neck stiffness.  Neurological: Negative.        Objective:   Physical Exam  Constitutional: She is oriented to person, place, and time. She appears well-developed and well-nourished.  Musculoskeletal:  Her neck is tender and has reduced ROM, especially with flexion. Other joints show some tenderness with little swelling. The left shoulder is tender anteriorly and ROM is very linited by pain  Neurological: She is alert and oriented to person, place, and time.          Assessment & Plan:  Diffuse arthralgias. We will increase her Celebrex to 200 mg bid. Start on Gabapentin 300 mg  tid. Add Norco as needed. Refer to Rheumatology to assess.  Laurey Morale, MD

## 2015-07-04 ENCOUNTER — Ambulatory Visit (HOSPITAL_COMMUNITY)
Admission: RE | Admit: 2015-07-04 | Discharge: 2015-07-04 | Disposition: A | Discharge status: Home / Self Care | Payer: 59 | Source: Ambulatory Visit | Attending: Internal Medicine | Admitting: Internal Medicine

## 2015-07-04 DIAGNOSIS — K7400 Hepatic fibrosis, unspecified: Secondary | ICD-10-CM

## 2015-07-04 DIAGNOSIS — K824 Cholesterolosis of gallbladder: Secondary | ICD-10-CM | POA: Diagnosis not present

## 2015-07-09 DIAGNOSIS — L570 Actinic keratosis: Secondary | ICD-10-CM | POA: Diagnosis not present

## 2015-07-15 ENCOUNTER — Other Ambulatory Visit: Payer: Self-pay | Admitting: *Deleted

## 2015-07-15 NOTE — Patient Outreach (Signed)
[facility]) Care Management  07/15/2015  [name] February 02, 1955 [id number]  Subjective: Telephone call to patient's home number / mobile number, no answer, left HIPAA compliant voicemail message and requested call back.  Objective: Per chart review, patient completion outpatient rehab on 02/18/15. Patient has a history of liver fibrosis, anemia, and chronic hepatitis C without hepatic coma. Patient was hospitalized 12/12/14 -12/13/14 For radiculopathy and had ANTERIOR CERVICAL DECOMPRESSION/DISCECTOMY FUSION 3 LEVELS C5-7 on 12/12/2014.   Assessment: Received UMR High Risk list referral on 06/03/15. Care coordination follow up pending, patient contact.     Plan: RNCM will advise patient  regarding process for future benefit exceptions for ongoing eye prosthetic maintenance within 2 weeks.  RNCM will call patient for telephone outreach within 2 weeks for follow up. Follow up on outcome of 07/04/15 MD office visit, 07/04/15 ultrasound, and verify date for prosthetic eye cleaning within 2 weeks.  RNCM will determine status of future care coordination needs within 2 weeks at next patient outreach.   Tamryn Popko H. Annia Friendly, BSN, [place] Management [facility] Telephonic CM Phone: [phone] Fax: [phone]

## 2015-07-16 ENCOUNTER — Other Ambulatory Visit: Payer: Self-pay | Admitting: *Deleted

## 2015-07-16 MED FILL — CELECOXIB 200 MG CAPSULE: 200 | 30 days supply | Qty: 60 | Fill #0

## 2015-07-16 NOTE — Patient Outreach (Signed)
[facility]) Care Management  07/16/2015  [name] 07-26-1955 [id number]  Subjective: Telephone call to patient's home number / mobile number, no answer, left HIPAA compliant voicemail message and requested call back.  Objective: Per chart review, patient completion outpatient rehab on 02/18/15. Patient has a history of liver fibrosis, anemia, and chronic hepatitis C without hepatic coma. Patient was hospitalized 12/12/14 -12/13/14 For radiculopathy and had ANTERIOR CERVICAL DECOMPRESSION/DISCECTOMY FUSION 3 LEVELS C5-7 on 12/12/2014.   Assessment: Received UMR High Risk list referral on 06/03/15. Care coordination follow up pending, patient contact.    Plan: RNCM will advise patient regarding process for future benefit exceptions for ongoing eye prosthetic maintenance within 2 weeks.  RNCM will call patient for 3rd attempt, telephone outreach within 2 weeks for follow up. Follow up on outcome of 07/04/15 MD office visit, 07/04/15 ultrasound, and verify date for prosthetic eye cleaning within 2 weeks.  RNCM will determine status of future care coordination needs within 2 weeks at next patient outreach.   Torrance Stockley H. Annia Friendly, BSN, [place] Management [facility] Telephonic CM Phone: [phone] Fax: [phone]

## 2015-07-17 ENCOUNTER — Other Ambulatory Visit: Payer: Self-pay | Admitting: *Deleted

## 2015-07-17 ENCOUNTER — Encounter: Payer: Self-pay | Admitting: *Deleted

## 2015-07-17 MED FILL — MOMETASONE FUROATE 50 MCG S: 50 | 30 days supply | Qty: 17 | Fill #3

## 2015-07-17 NOTE — Patient Outreach (Signed)
[facility] [facility]) Care Management  07/17/2015  [name] December 12, 1955 [id number]  Subjective: Telephone call to patient's home number / mobile number, no answer, left HIPAA compliant voicemail message and requested call back.  Objective: Per chart review, patient completion outpatient rehab on 02/18/15. Patient has a history of liver fibrosis, anemia, and chronic hepatitis C without hepatic coma. Patient was hospitalized 12/12/14 -12/13/14 For radiculopathy and had ANTERIOR CERVICAL DECOMPRESSION/DISCECTOMY FUSION 3 LEVELS C5-7 on 12/12/2014.   Assessment: Received UMR High Risk list referral on 06/03/15. Care coordination follow up pending, patient contact.    Plan: RNCM will send patient unsuccessful outreach letter, [place] Management pamphlet, and proceed with case closure, if no return call from patient within 10 business days.     Ruford Dudzinski H. Annia Friendly, BSN, [place] Management [facility] Telephonic CM Phone: [phone] Fax: [phone]

## 2015-07-24 ENCOUNTER — Telehealth: Payer: Self-pay | Admitting: Family Medicine

## 2015-07-24 NOTE — Telephone Encounter (Signed)
Pt requesting a refill on Norco.

## 2015-07-25 DIAGNOSIS — M67912 Unspecified disorder of synovium and tendon, left shoulder: Secondary | ICD-10-CM | POA: Diagnosis not present

## 2015-07-25 DIAGNOSIS — M25512 Pain in left shoulder: Secondary | ICD-10-CM | POA: Diagnosis not present

## 2015-07-25 NOTE — Telephone Encounter (Signed)
I had referred her to Rheumatology to manage this. Has she gotten an app yet?

## 2015-07-26 MED ORDER — HYDROCODONE-ACETAMINOPHEN 10-325 MG PO TABS: 1.0000 | ORAL_TABLET | Freq: Three times a day (TID) | ORAL | Status: DC | PRN

## 2015-07-26 MED FILL — HYDROCODON-APAP 10-325: 10-325 | 30 days supply | Qty: 90 | Fill #0

## 2015-07-26 NOTE — Telephone Encounter (Signed)
Pt is scheduled on July 11, she saw Dr. Berenice Primas yesterday and got a injection, he offered pain med and she told him that she was getting this from you.

## 2015-07-26 NOTE — Telephone Encounter (Signed)
Script is ready for pick up an I spoke with pt.

## 2015-07-26 NOTE — Telephone Encounter (Signed)
done

## 2015-07-27 MED FILL — GABAPENTIN 300 MG CAPSULE: 300 | 30 days supply | Qty: 90 | Fill #1

## 2015-08-01 ENCOUNTER — Other Ambulatory Visit: Payer: Self-pay | Admitting: *Deleted

## 2015-08-01 NOTE — Patient Outreach (Signed)
[facility]) Care Management  08/01/2015  [name] Jan 28, 1955 [id number]  No response from patient outreach attempts.  Objective: Per chart review, patient completion outpatient rehab on 02/18/15. Patient has a history of liver fibrosis, anemia, and chronic hepatitis C without hepatic coma. Patient was hospitalized 12/12/14 -12/13/14 For radiculopathy and had ANTERIOR CERVICAL DECOMPRESSION/DISCECTOMY FUSION 3 LEVELS C5-7 on 12/12/2014.   Assessment: Received UMR High Risk list referral on 06/03/15. Care coordination follow up not completed,  due to patient unable to reach, and will proceed with case closure.     Plan: RNCM will send case closure due to unable to reach request to [name] [place] Management.      [name] H. Annia Friendly, BSN, [place] Management [facility] Telephonic CM Phone: [phone] Fax: [phone]

## 2015-08-06 DIAGNOSIS — R682 Dry mouth, unspecified: Secondary | ICD-10-CM | POA: Diagnosis not present

## 2015-08-06 DIAGNOSIS — M791 Myalgia: Secondary | ICD-10-CM | POA: Diagnosis not present

## 2015-08-07 DIAGNOSIS — H04122 Dry eye syndrome of left lacrimal gland: Secondary | ICD-10-CM | POA: Diagnosis not present

## 2015-08-07 DIAGNOSIS — H2512 Age-related nuclear cataract, left eye: Secondary | ICD-10-CM | POA: Diagnosis not present

## 2015-08-07 DIAGNOSIS — Z970 Presence of artificial eye: Secondary | ICD-10-CM | POA: Diagnosis not present

## 2015-08-14 DIAGNOSIS — R682 Dry mouth, unspecified: Secondary | ICD-10-CM | POA: Diagnosis not present

## 2015-08-14 DIAGNOSIS — M791 Myalgia: Secondary | ICD-10-CM | POA: Diagnosis not present

## 2015-08-14 MED FILL — valACYclovir HCL 1 GM TABS: 1 | 90 days supply | Qty: 90 | Fill #1

## 2015-08-21 MED FILL — CELECOXIB 200 MG CAPSULE: 200 | 30 days supply | Qty: 60 | Fill #1

## 2015-08-23 ENCOUNTER — Ambulatory Visit (INDEPENDENT_AMBULATORY_CARE_PROVIDER_SITE_OTHER): Payer: 59 | Admitting: Family Medicine

## 2015-08-23 VITALS — BP 143/88 | HR 66 | Temp 97.6°F | Ht 62.0 in | Wt 108.0 lb

## 2015-08-23 DIAGNOSIS — M2550 Pain in unspecified joint: Secondary | ICD-10-CM

## 2015-08-23 DIAGNOSIS — M810 Age-related osteoporosis without current pathological fracture: Secondary | ICD-10-CM

## 2015-08-23 DIAGNOSIS — B351 Tinea unguium: Secondary | ICD-10-CM | POA: Insufficient documentation

## 2015-08-23 MED ORDER — HYDROCODONE-ACETAMINOPHEN 10-325 MG PO TABS: 1.0000 | ORAL_TABLET | Freq: Three times a day (TID) | ORAL | 0 refills | Status: DC | PRN

## 2015-08-23 MED ORDER — TERBINAFINE HCL 250 MG PO TABS: 250.0000 mg | ORAL_TABLET | Freq: Every day | ORAL | 1 refills | Status: DC

## 2015-08-23 MED FILL — TERBINAFINE HCL 250 MG TAB: 250 | 90 days supply | Qty: 90 | Fill #0

## 2015-08-23 NOTE — Progress Notes (Signed)
Pre visit review using our clinic review tool, if applicable. No additional management support is needed unless otherwise documented below in the visit note. 

## 2015-08-23 NOTE — Progress Notes (Signed)
   Subjective:    Patient ID: [name], female    DOB: [birth year], 60 y.o.   MRN: [id number]  HPI Here to follow up on joint pains. She saw Rheumatology and they said she has osteoarthritis and not inflammatory arthritis, so they turned her care back over to [place]. She is taking Celebrex 200 mg bid, Gabapentin 300 mg tid, and prn Norco. She is still working out and lifting weights. She describes stiffness in the spine and joints as much as pain. Also she mentions that her toenails are all loose and threatening to come off.    Review of Systems  Constitutional: Negative.   Respiratory: Negative.   Cardiovascular: Negative.   Musculoskeletal: Positive for arthralgias, back pain, neck pain and neck stiffness.  Neurological: Negative.        Objective:   Physical Exam  Constitutional: She is oriented to person, place, and time. She appears well-developed and well-nourished.  Cardiovascular: Normal rate, regular rhythm, normal heart sounds and intact distal pulses.   Pulmonary/Chest: Effort normal and breath sounds normal.  Musculoskeletal: She exhibits edema.  Neurological: She is alert and oriented to person, place, and time.  Skin:  All ten toenails show fungal involvement and most of them are loose           Assessment & Plan:  For the DJD she will stay on current meds. I suggested she try yoga to improve flexibility as well as strength. For the toenails, she will try Terbinafine.  Laurey Morale, MD

## 2015-08-26 MED FILL — HYDROCODON-APAP 10-325: 10-325 | 30 days supply | Qty: 90 | Fill #0

## 2015-08-26 MED FILL — MOMETASONE FUROATE 50 MCG S: 50 | 30 days supply | Qty: 17 | Fill #4

## 2015-09-04 MED FILL — GABAPENTIN 300 MG CAPSULE: 300 | 30 days supply | Qty: 90 | Fill #2

## 2015-09-06 ENCOUNTER — Other Ambulatory Visit: Payer: Self-pay | Admitting: *Deleted

## 2015-09-06 NOTE — Patient Outreach (Addendum)
[facility]) Care Management  09/06/2015  RAELIN DELEO 1955-07-23 [id number]   Subjective: Received voicemail message from patient, she saw her eye MD (Dr. Marilynne Halsted) in July, will need prosthetic eye cleaned on 10/19/15, at [facility], and would like to start the benefit exception process so it will be covered.   She requested call back.  Telephone call to patient's mobile number, left HIPAA compliant voicemail message, and requested call back.   Objective:  Patient had office visit with ophthalmologist on 08/07/15 at [facility].  Assessment: Received verbal request from patient on 09/06/15 to start the benefit exception process for cleaning of right eye prosthetic.   Benefit Exception process initiation,  pending patient contact.   Plan: RNCM will call patient for 2nd attempt telephone outreach, benefit exception initiation, within 10 business days, if no return call.   Tyliyah Mcmeekin H. Annia Friendly, BSN, [place] Management [facility] Telephonic CM Phone: [phone] Fax: [phone]

## 2015-09-06 NOTE — Patient Outreach (Addendum)
[facility]) Care [place]  09/06/2015  [name] Jan 10, 1956 [id number]   Subjective: Telephone call from patient and HIPAA verified.   State she is returning RNCM's call and wanting to initiate the benefit exception process for the cleaning of her eye prosthesis.  States her appointment with [place] is on 10/17/15 versus 10/19/15.   RNCM advised to start the process a [facility] Care [place] consent form would need to be filled out, signed, and returned to [facility].   Advised once signed consent received, RNCM will reach out to provider to obtain supporting documentation for the request.   Advised RNCM would review documentation and submitted request for review.  Once reviewed, a decision would be render, and RNCM  would notify patient of the decision.     Objective:  Patient had office visit with ophthalmologist on 08/07/15 at [facility].  Assessment: Received verbal request from patient on 09/06/15 to start the benefit exception process for cleaning of right eye prosthetic. Benefit Exception process initiation,  pending receipt of [facility] Care [place] signed consent form.   Plan: RNCM will email patient [name] consent form and fax number to return consent to [facility].  RNCM will call patient within 5 business weeks to give status update on benefit exception.    Briley Bumgarner H. Annia Friendly, BSN, [place] Dunn [facility] Telephonic CM Phone: [phone] Fax: [phone]

## 2015-09-09 ENCOUNTER — Ambulatory Visit: Payer: Self-pay | Admitting: *Deleted

## 2015-09-10 ENCOUNTER — Other Ambulatory Visit: Payer: Self-pay | Admitting: *Deleted

## 2015-09-10 NOTE — Patient Outreach (Addendum)
[facility]) Care Management  09/10/2015  ANFAL FIORITO December 26, 1955 [id number]  Subjective: RNCM received [facility] Care Management signed consent form dated 09/06/15.     Telephone call to [place] at [facility] and HIPAA verified.   RNCM requested updated letter of medical necessity with billing codes and charges for prosthetic eye cleaning.   [name] states the date of service will be 10/17/15 and she will send requested information to [facility] as soon as possible.  Patient will continue to receive [place] Management services.    Objective: Patient had office visit with ophthalmologist on 08/07/15 at [facility].  Assessment: Received verbal request from patient on 09/06/15 to start the benefit exception process for cleaning of right eye prosthetic. Benefit Exception process initiated and  [facility] Care Management signed consent form received on 09/09/15.    Plan: RNCM will verify receipt of updated letter of medical necessity within 10 business days.  RNCM will call patient within 4 business weeks to give status update on benefit exception.    Vickie Ponds H. Annia Friendly, BSN, [place] Management [facility] Telephonic CM Phone: [phone] Fax: [phone]

## 2015-09-13 ENCOUNTER — Telehealth: Payer: Self-pay | Admitting: Internal Medicine

## 2015-09-13 MED ORDER — AMOXICILLIN-POT CLAVULANATE 875-125 MG PO TABS: 1.0000 | ORAL_TABLET | Freq: Two times a day (BID) | ORAL | 0 refills | Status: DC

## 2015-09-13 MED FILL — AMOX-CLAV 875-125 MG TABLET: 875-125 | 10 days supply | Qty: 20 | Fill #0

## 2015-09-13 NOTE — Telephone Encounter (Signed)
°  Pt need an antibiotic for a puncture on hand playing with dog.Marland Kitchen    Pharmacy;  [facility]

## 2015-09-13 NOTE — Telephone Encounter (Signed)
I looked at her right hand and there is a superficial puncture wound over the right thenar eminence. No bleeding. Mildly tender. Full ROM of the thumb. We will treat her with Augmentin.

## 2015-09-18 ENCOUNTER — Other Ambulatory Visit: Payer: Self-pay | Admitting: *Deleted

## 2015-09-18 NOTE — Patient Outreach (Addendum)
[facility]) Care Management  09/18/2015  ALENCIA MAGSTADT 1955/12/19 [id number]   Subjective:  Received voicemail message left on 09/17/15 from [place] at [facility], request call back with status update on patient's insurance in-network authorization. Telephone call to [facility], spoke with [name], and left HIPAA compliant message for [name], advised RNCM will call with update when authorization received. Patient will continue to receive [place] Management services.   Objective: Patient had office visit with ophthalmologist on 08/07/15 at [facility].  Assessment: Received verbal request from patient on 09/06/15 to start the benefit exception process for cleaning of right eye prosthetic. Benefit Exception process initiated and  [facility] Care Management signed consent form received on 09/09/15.   Letter of necessity from [facility] received on 09/12/15, benefit exception request in progress.   Plan: RNCM will follow up on insurance authorization within 4 business weeks.  RNCM will call patient within 4 business weeks to give status update on benefit exception.  RNCM will call provider ([place]) within 4 business weeks to give status update on benefit exception.     Artrell Lawless H. Annia Friendly, BSN, [place] Management [facility] Telephonic CM Phone: [phone] Fax: [phone]

## 2015-09-19 ENCOUNTER — Other Ambulatory Visit: Payer: Self-pay | Admitting: *Deleted

## 2015-09-19 ENCOUNTER — Other Ambulatory Visit: Payer: Self-pay | Admitting: Family Medicine

## 2015-09-19 DIAGNOSIS — M810 Age-related osteoporosis without current pathological fracture: Secondary | ICD-10-CM

## 2015-09-19 NOTE — Patient Outreach (Signed)
[facility] [facility]) Care Management  09/19/2015  [name] 1955-03-19 [id number]  Subjective: Telephone call to [facility], no answer, left HIPAA compliant voicemail message for [name] or [name], and requested call back. Patient will continue to receive [place] Management services.  Objective: Patient had office visit with ophthalmologist on 08/07/15 at [facility].  Assessment: Received verbal request from patient on 09/06/15 to start the benefit exception process for cleaning of right eye prosthetic. Benefit Exception process initiated and [facility] Care Management signed consent form received on 09/09/15.  Letter of necessity from [facility] received on 09/12/15, benefit exception request in progress.   Plan: RNCM will follow up on insurance authorization within 4 business weeks.  RNCM will call patient within 4 business weeks to give status update on benefit exception.  RNCM will call provider ([place]) within 4 business weeks to give status update on benefit exception.     [name] H. Annia Friendly, BSN, [place] Management [facility] Telephonic CM Phone: [phone] Fax: [phone]

## 2015-09-19 NOTE — Patient Outreach (Addendum)
[place] [facility]) Care Management  09/19/2015  MELINDA DUBE [birth year] [id number]  Subjective: Received voicemail message from [place] at [facility], requesting status update on the benefit exception request.   Telephone call to patient's provider office ([place], spoke with [place], and HIPAA verified.   States [place] Ropes has left for the day and she  works daily until 1:00pm.   States patient's last visit was on 11/28/14.    States provider is recommending cleaning and polishing every 6 months.  Advised I have not received a decision on the benefit exception request and will notify them once decision has been rendered.  [name] voices understanding and states she will advise [name] of the update. Patient will continue to receive [place] Management services.   Objective: Patient had office visit with ophthalmologist on 08/07/15 at [facility].  Assessment: Received verbal request from patient on 09/06/15 to start the benefit exception process for cleaning of right eye prosthetic. Benefit Exception process initiated and [facility] Care Management signed consent form received on 09/09/15. Letter of necessity from [facility] received on 09/12/15, benefit exception request in progress.   Plan: RNCM will follow up on insurance authorization within 4 business weeks.  RNCM will call patient within 4 business weeks to give status update on benefit exception.  RNCM will call provider ([place]) within 4 business weeks to give status update on benefit exception.     Kieron Kantner H. Annia Friendly, BSN, [place] Management [facility] Telephonic CM Phone: [phone] Fax: [phone]

## 2015-09-20 ENCOUNTER — Ambulatory Visit: Payer: Self-pay | Admitting: *Deleted

## 2015-09-23 ENCOUNTER — Ambulatory Visit: Payer: Self-pay | Admitting: *Deleted

## 2015-09-23 MED FILL — CELECOXIB 200 MG CAPSULE: 200 | 30 days supply | Qty: 60 | Fill #2

## 2015-09-23 MED FILL — HYDROCODON-APAP 10-325: 10-325 | 30 days supply | Qty: 90 | Fill #0

## 2015-09-25 ENCOUNTER — Ambulatory Visit: Payer: Self-pay | Admitting: *Deleted

## 2015-09-25 ENCOUNTER — Other Ambulatory Visit: Payer: Self-pay | Admitting: *Deleted

## 2015-09-25 NOTE — Patient Outreach (Addendum)
[place] [facility]) Care Management  09/25/2015  LASHAN NELLUMS [birth year] [id number]   Subjective: Telephone call to patient's mobile number, no answer, left HIPAA compliant voicemail message, and requested call back.  Objective: Patient had office visit with ophthalmologist on 08/07/15 at [facility].  Assessment: Received verbal request from patient on 09/06/15 to start the benefit exception process for cleaning of right eye prosthetic. Benefit Exception process initiated and [facility] Care Management signed consent form received on 09/09/15. Letter of necessity from [facility] received on 09/12/15, benefit exception request completed and decision in progress.   Plan: RNCM will follow up on insurance authorization within 3 business weeks.  RNCM will call patient within 3 business weeks to give status update on benefit exception.  RNCM will call provider ([place]) within 3 business weeks to give status update on benefit exception.     Judith Campillo H. Annia Friendly, BSN, [place] Management [facility] Telephonic CM Phone: [phone] Fax: [phone]

## 2015-09-25 NOTE — Patient Outreach (Signed)
[name]) Care Management  09/25/2015  [name] 1955/12/15 [id number]  Subjective: Telephone call from patient and HIPAA verified.  RNCM advised patient medical information has been received and reviewed.  Advised benefit exception request submitted and waiting for decision.   RNCM advised will update patient and provider once decision has been rendered.  Patient voices understanding and is appreciative of the update. States she will be going out of town for vacation in the near future after eye prothesis cleaning and polishing.   Patient will continue to receive [place] [facility] services.  Objective: Patient had office visit with ophthalmologist on 08/07/15 at [facility].  Assessment: Received verbal request from patient on 09/06/15 to start the benefit exception process for cleaning of right eye prosthetic. Benefit Exception process initiated and [facility] Care Management signed consent form received on 09/09/15. Letter of necessity from [facility] received on 09/12/15, benefit exception request completed and decision in progress.   Plan: RNCM will follow up on insurance authorization within 3 business weeks.  RNCM will call patient within 3 business weeks to give status update on benefit exception.  RNCM will call provider ([place]) within 3 business weeks to give status update on benefit exception.     [name] H. Annia Friendly, BSN, [place] Management [facility] Telephonic CM Phone: [phone] Fax: [phone]

## 2015-10-02 MED FILL — MOMETASONE FUROATE 50 MCG S: 50 | 30 days supply | Qty: 17 | Fill #5

## 2015-10-04 ENCOUNTER — Other Ambulatory Visit: Payer: Self-pay | Admitting: Family Medicine

## 2015-10-04 ENCOUNTER — Other Ambulatory Visit: Payer: Self-pay | Admitting: *Deleted

## 2015-10-04 ENCOUNTER — Ambulatory Visit: Payer: Self-pay | Admitting: *Deleted

## 2015-10-04 NOTE — Patient Outreach (Addendum)
[facility]) Care Management  10/04/2015  [name] 19-Sep-1955 [id number]   Subjective: Received email notification that benefit exception has been approved and has been put in the [facility] system. Telephone call to [name] at [facility] and HIPAA verified.  RNCM advised patient's benefit exception for in network coverage benefit level has been approved.  [name] is requesting authorization number, RNCM advised authorization is not usually provided, and UMR has noted approval in their system for future claim processing.  RNCM advised will follow up to see if authorization number can be obtained for providers future reference. Telephone call to patient's mobile number, spoke with patient, and HIPAA verified.  RNCM advised patient of in-network ongoing benefit exception approval and authorization called to provider.    Patient voices understanding and states she will notify RNCM of future dates of services as needed.   Advised patient of providers request for authorization number and RNCM will follow up on request.  Patient states she is very appreciative of assistance and has no care coordination needs at this time.  Telephone call to [name] at [place] Management Recruitment consultant) advised of providers request,  states she will follow up and provide RNCM with an update on request.  Objective: Patient had office visit with ophthalmologist on 08/07/15 at [facility].  Assessment: Received verbal request from patient on 09/06/15 to start the benefit exception process for cleaning of right eye prosthetic. Benefit Exception process initiated and [facility] Care Management signed consent form received on 09/09/15. Letter of necessity from [facility] received on 09/12/15, benefit exception request completed and services authorized at in-network benefit level.  Provider authorization number notification, pending UMR contact.   Plan:  RNCM will follow up on insurance authorization number within 2 business weeks.  RNCM will call provider ([place]) within 2business weeks to give status update on benefit exception authorization number.     [name] H. [name], BSN, [place] Management [facility] Telephonic CM Phone: [phone] Fax: [phone]

## 2015-10-07 ENCOUNTER — Other Ambulatory Visit: Payer: Self-pay | Admitting: *Deleted

## 2015-10-07 NOTE — Telephone Encounter (Signed)
Last filled on 6/02 #90 +2. Last OV 08/23/15 ok to refill?

## 2015-10-07 NOTE — Patient Outreach (Addendum)
[facility]) Care Management  10/07/2015  [name] 02/18/1955 [id number]   Subjective: Telephone call to [place] at [facility] and HIPAA verified.    RNCM advised [name] of the following authorization number [id number] for benefit exception approval.  Telephone call to patient's mobile number, left HIPAA compliant voicemail message, and requested call back.   Objective: Patient had office visit with ophthalmologist on 08/07/15 at [facility].  Assessment: Received verbal request from patient on 09/06/15 to start the benefit exception process for cleaning of right eye prosthetic. Benefit Exception process initiated and [facility] Care Management signed consent form received on 09/09/15. Letter of necessity from [facility] received on 09/12/15, benefit exception request completed and services authorized at in-network benefit level.  Provider authorization number given to provider, will proceed with case closure.  Patient does not have any transition of care, care coordination, disease management, disease monitoring, transportation, community resource, or pharmacy needs at this time.   Plan: RNCM will send case closure due to follow up completed to [name] [place] [facility].       [name] H. Annia Friendly, BSN, [place] Management [facility] Telephonic CM Phone: [phone] Fax: [phone]

## 2015-10-08 ENCOUNTER — Ambulatory Visit
Admission: RE | Admit: 2015-10-08 | Discharge: 2015-10-08 | Disposition: A | Discharge status: Home / Self Care | Payer: 59 | Source: Ambulatory Visit | Attending: Family Medicine | Admitting: Family Medicine

## 2015-10-08 DIAGNOSIS — M810 Age-related osteoporosis without current pathological fracture: Secondary | ICD-10-CM

## 2015-10-08 DIAGNOSIS — Z1231 Encounter for screening mammogram for malignant neoplasm of breast: Secondary | ICD-10-CM | POA: Diagnosis not present

## 2015-10-08 MED FILL — GABAPENTIN 300 MG CAPSULE: 300 | 30 days supply | Qty: 90 | Fill #0

## 2015-10-08 NOTE — Telephone Encounter (Signed)
Call in #90 with 5 rf 

## 2015-10-11 ENCOUNTER — Ambulatory Visit: Payer: Self-pay | Admitting: *Deleted

## 2015-10-17 DIAGNOSIS — Z4421 Encounter for fitting and adjustment of artificial right eye: Secondary | ICD-10-CM | POA: Diagnosis not present

## 2015-10-24 ENCOUNTER — Other Ambulatory Visit: Payer: Self-pay | Admitting: Family Medicine

## 2015-10-25 MED FILL — HYDROCODON-APAP 10-325: 10-325 | 30 days supply | Qty: 90 | Fill #0

## 2015-10-29 MED FILL — CELECOXIB 200 MG CAPSULE: 200 | 30 days supply | Qty: 60 | Fill #0

## 2015-11-04 ENCOUNTER — Other Ambulatory Visit: Payer: Self-pay | Admitting: Family Medicine

## 2015-11-04 ENCOUNTER — Other Ambulatory Visit: Payer: Self-pay | Admitting: *Deleted

## 2015-11-04 NOTE — Patient Outreach (Signed)
[facility]) Care Management  11/04/2015  [name] September 18, 1955 [id number]   Subjective: Telephone call from patient, telephone call to patient, and HIPAA verified.   States she had to reschedule eye cleaning and it was done today by [employer].  States [facility] requires payment at the time of services and they stated that they had already submitted claim and it was denied due to non-covered service.   RNCM advised patient of authorization number [id number] for benefit exception approval.  Patient stated she will call UMR to follow up and call RNCM if further assistance needed.    Objective: Patient had office visit with ophthalmologist on 08/07/15 at [facility].   Assessment: Received verbal request from patient on 09/06/15 to start the benefit exception process for cleaning of right eye prosthetic. Benefit Exception process initiated and [facility] Care Management signed consent form received on 09/09/15. Letter of necessity from [facility] received on 09/12/15, benefit exception request completed and services authorized at in-network benefit level. Provider authorization number given to provider and patient.   Patient will follow up with [facility] regarding denial.  Case will remain closed. Patient does not have any transition of care, care coordination, disease management, disease monitoring, transportation, community resource, or pharmacy needs at this time.   Plan: Case will remain closed and patient will contact RNCM if assistance needed.        Macklyn Glandon H. Annia Friendly, BSN, [place] Management [facility] Telephonic CM Phone: [phone] Fax: [phone]

## 2015-11-04 NOTE — Telephone Encounter (Signed)
Refill request for Miralax and send to [facility] long outpatient pharmacy.

## 2015-11-05 ENCOUNTER — Encounter: Payer: Self-pay | Admitting: Family Medicine

## 2015-11-05 ENCOUNTER — Ambulatory Visit (INDEPENDENT_AMBULATORY_CARE_PROVIDER_SITE_OTHER): Payer: 59 | Admitting: Family Medicine

## 2015-11-05 VITALS — BP 119/75 | HR 64 | Temp 97.7°F

## 2015-11-05 DIAGNOSIS — J019 Acute sinusitis, unspecified: Secondary | ICD-10-CM | POA: Diagnosis not present

## 2015-11-05 MED ORDER — POLYETHYLENE GLYCOL 3350 17 G PO PACK: 17.0000 g | PACK | Freq: Every day | ORAL | 6 refills | Status: DC

## 2015-11-05 MED ORDER — AZITHROMYCIN 250 MG PO TABS: ORAL_TABLET | ORAL | 0 refills | Status: DC

## 2015-11-05 MED ORDER — HYDROCODONE-HOMATROPINE 5-1.5 MG/5ML PO SYRP: 5.0000 mL | ORAL_SOLUTION | ORAL | 0 refills | Status: DC | PRN

## 2015-11-05 MED FILL — AZITHROMYCIN 250 MG TABLET: 250 | 5 days supply | Qty: 6 | Fill #0

## 2015-11-05 MED FILL — HYDROCODONE-HOMATROPINE SYR: 5-1.5 | 8 days supply | Qty: 240 | Fill #0

## 2015-11-05 NOTE — Progress Notes (Signed)
   Subjective:    Patient ID: [name], female    DOB: 1955-03-10, 60 y.o.   MRN: [id number]  HPI Here for 6 days of stuffy head, PND, ST, and a dry cough. This started off with body aches and a fever, but these subsided. She had just returned from a cruise to [place] when she got sick.    Review of Systems  Constitutional: Negative.   HENT: Positive for congestion, postnasal drip, sinus pressure and sore throat.   Eyes: Negative.   Respiratory: Positive for cough.        Objective:   Physical Exam  Constitutional: She appears well-developed and well-nourished.  HENT:  Right Ear: External ear normal.  Left Ear: External ear normal.  Nose: Nose normal.  Mouth/Throat: Oropharynx is clear and moist.  Eyes: Conjunctivae are normal.  Neck: No thyromegaly present.  Pulmonary/Chest: Effort normal and breath sounds normal. No respiratory distress. She has no wheezes. She has no rales.  Lymphadenopathy:    She has no cervical adenopathy.          Assessment & Plan:  Sinusitis, treat with a Zpack.  Laurey Morale, MD

## 2015-11-05 NOTE — Telephone Encounter (Signed)
Per Dr. Sarajane Jews okay to refill and I did send script e-scribe to below pharmacy.

## 2015-11-05 NOTE — Progress Notes (Signed)
Pre visit review using our clinic review tool, if applicable. No additional management support is needed unless otherwise documented below in the visit note. 

## 2015-11-06 ENCOUNTER — Telehealth: Payer: Self-pay | Admitting: Family Medicine

## 2015-11-06 MED ORDER — POLYETHYLENE GLYCOL 3350 17 GM/SCOOP PO POWD: 17.0000 g | Freq: Two times a day (BID) | ORAL | 1 refills | Status: DC | PRN

## 2015-11-06 MED FILL — POLYETHYLENE GLYCOL 3350 PO: 90 days supply | Qty: 3162 | Fill #0

## 2015-11-06 NOTE — Telephone Encounter (Signed)
Refill request for Miralax powder and a 90 day supply to [facility] long outpatient pharmacy.

## 2015-11-06 NOTE — Telephone Encounter (Signed)
I spoke with pharmacist and he suggested 3162 grams for a 3 month supply. I did send this script e-scribe.

## 2015-11-13 ENCOUNTER — Other Ambulatory Visit: Payer: Self-pay | Admitting: Family Medicine

## 2015-11-15 MED FILL — GABAPENTIN 300 MG CAPSULE: 300 | 30 days supply | Qty: 90 | Fill #1

## 2015-11-20 ENCOUNTER — Telehealth: Payer: Self-pay | Admitting: Family Medicine

## 2015-11-20 MED ORDER — HYDROCODONE-ACETAMINOPHEN 10-325 MG PO TABS: 1.0000 | ORAL_TABLET | Freq: Three times a day (TID) | ORAL | 0 refills | Status: DC | PRN

## 2015-11-20 MED ORDER — HYDROCODONE-ACETAMINOPHEN 10-325 MG PO TABS
1.0000 | ORAL_TABLET | Freq: Three times a day (TID) | ORAL | 0 refills | Status: DC | PRN
Start: 2015-11-20 — End: 2015-11-20

## 2015-11-20 MED ORDER — HYDROCODONE-ACETAMINOPHEN 10-325 MG PO TABS
1.0000 | ORAL_TABLET | Freq: Three times a day (TID) | ORAL | 0 refills | Status: DC | PRN
Start: 2015-11-20 — End: 2016-02-14

## 2015-11-20 MED FILL — TERBINAFINE HCL 250 MG TAB: 250 | 90 days supply | Qty: 90 | Fill #1

## 2015-11-20 NOTE — Telephone Encounter (Signed)
Script is ready for pick up and I spoke with pt.  

## 2015-11-20 NOTE — Telephone Encounter (Signed)
done

## 2015-11-20 NOTE — Telephone Encounter (Signed)
Refill request for Norco and normally gets a 3 month supply.

## 2015-11-22 MED FILL — HYDROCODON-APAP 10-325: 10-325 | 30 days supply | Qty: 90 | Fill #0

## 2015-12-03 MED FILL — CELECOXIB 200 MG CAPSULE: 200 | 30 days supply | Qty: 60 | Fill #1

## 2015-12-04 DIAGNOSIS — M67431 Ganglion, right wrist: Secondary | ICD-10-CM | POA: Diagnosis not present

## 2015-12-24 MED FILL — HYDROCODON-APAP 10-325: 10-325 | 30 days supply | Qty: 90 | Fill #0

## 2015-12-24 MED FILL — GABAPENTIN 300 MG CAPSULE: 300 | 30 days supply | Qty: 90 | Fill #2

## 2015-12-25 MED FILL — valACYclovir HCL 1 GM TABS: 1 | 90 days supply | Qty: 90 | Fill #2

## 2016-01-15 MED FILL — CELECOXIB 200 MG CAPSULE: 200 | 30 days supply | Qty: 60 | Fill #2

## 2016-01-23 MED FILL — HYDROCODON-APAP 10-325: 10-325 | 30 days supply | Qty: 90 | Fill #0

## 2016-01-30 DIAGNOSIS — M67431 Ganglion, right wrist: Secondary | ICD-10-CM | POA: Diagnosis not present

## 2016-02-07 ENCOUNTER — Other Ambulatory Visit (INDEPENDENT_AMBULATORY_CARE_PROVIDER_SITE_OTHER): Payer: 59

## 2016-02-07 DIAGNOSIS — L309 Dermatitis, unspecified: Secondary | ICD-10-CM | POA: Diagnosis not present

## 2016-02-07 DIAGNOSIS — E559 Vitamin D deficiency, unspecified: Secondary | ICD-10-CM | POA: Diagnosis not present

## 2016-02-07 DIAGNOSIS — L821 Other seborrheic keratosis: Secondary | ICD-10-CM | POA: Diagnosis not present

## 2016-02-07 DIAGNOSIS — Z0000 Encounter for general adult medical examination without abnormal findings: Secondary | ICD-10-CM

## 2016-02-07 DIAGNOSIS — L814 Other melanin hyperpigmentation: Secondary | ICD-10-CM | POA: Diagnosis not present

## 2016-02-07 DIAGNOSIS — L570 Actinic keratosis: Secondary | ICD-10-CM | POA: Diagnosis not present

## 2016-02-07 LAB — LIPID PANEL
Cholesterol: 206 mg/dL — ABNORMAL HIGH (ref 0–200)
HDL: 57.3 mg/dL (ref >39.00)
LDL Cholesterol: 128 mg/dL — ABNORMAL HIGH (ref 0–99)
NonHDL: 148.94
Total CHOL/HDL Ratio: 4
Triglycerides: 103 mg/dL (ref 0.0–149.0)
VLDL: 20.6 mg/dL (ref 0.0–40.0)

## 2016-02-07 LAB — CBC WITH DIFFERENTIAL/PLATELET
Basophils Absolute: 0 10*3/uL (ref 0.0–0.1)
Basophils Relative: 0.4 % (ref 0.0–3.0)
Eosinophils Absolute: 0.1 10*3/uL (ref 0.0–0.7)
Eosinophils Relative: 1.6 % (ref 0.0–5.0)
HCT: 39.9 % (ref 36.0–46.0)
Hemoglobin: 13.7 g/dL (ref 12.0–15.0)
Lymphocytes Relative: 53.7 % — ABNORMAL HIGH (ref 12.0–46.0)
Lymphs Abs: 2.9 10*3/uL (ref 0.7–4.0)
MCHC: 34.3 g/dL (ref 30.0–36.0)
MCV: 95 fl (ref 78.0–100.0)
Monocytes Absolute: 0.4 10*3/uL (ref 0.1–1.0)
Monocytes Relative: 7.6 % (ref 3.0–12.0)
Neutro Abs: 2 10*3/uL (ref 1.4–7.7)
Neutrophils Relative %: 36.7 % — ABNORMAL LOW (ref 43.0–77.0)
Platelets: 155 10*3/uL (ref 150.0–400.0)
RBC: 4.2 Mil/uL (ref 3.87–5.11)
RDW: 13.1 % (ref 11.5–15.5)
WBC: 5.4 10*3/uL (ref 4.0–10.5)

## 2016-02-07 LAB — HEPATIC FUNCTION PANEL
ALT: 10 U/L (ref 0–35)
AST: 19 U/L (ref 0–37)
Albumin: 4.7 g/dL (ref 3.5–5.2)
Alkaline Phosphatase: 41 U/L (ref 39–117)
Bilirubin, Direct: 0.1 mg/dL (ref 0.0–0.3)
Total Bilirubin: 0.7 mg/dL (ref 0.2–1.2)
Total Protein: 7.4 g/dL (ref 6.0–8.3)

## 2016-02-07 LAB — POC URINALSYSI DIPSTICK (AUTOMATED)
Bilirubin, UA: NEGATIVE
Blood, UA: NEGATIVE
Glucose, UA: NEGATIVE
Ketones, UA: NEGATIVE
Leukocytes, UA: NEGATIVE
Nitrite, UA: NEGATIVE
Protein, UA: NEGATIVE
Spec Grav, UA: 1.01
Urobilinogen, UA: 0.2
pH, UA: 6.5

## 2016-02-07 LAB — BASIC METABOLIC PANEL
BUN: 16 mg/dL (ref 6–23)
CO2: 32 mEq/L (ref 19–32)
Calcium: 9.6 mg/dL (ref 8.4–10.5)
Chloride: 103 mEq/L (ref 96–112)
Creatinine, Ser: 0.87 mg/dL (ref 0.40–1.20)
GFR: 71.02 mL/min (ref >60.00)
Glucose, Bld: 85 mg/dL (ref 70–99)
Potassium: 4.5 mEq/L (ref 3.5–5.1)
Sodium: 141 mEq/L (ref 135–145)

## 2016-02-07 LAB — VITAMIN D 25 HYDROXY (VIT D DEFICIENCY, FRACTURES): VITD: 30.82 ng/mL (ref 30.00–100.00)

## 2016-02-07 LAB — TSH: TSH: 1.58 u[IU]/mL (ref 0.35–4.50)

## 2016-02-07 MED FILL — TRIAMCINOLONE 0.1% CREAM: 0.1 | 21 days supply | Qty: 60 | Fill #0

## 2016-02-14 ENCOUNTER — Ambulatory Visit (INDEPENDENT_AMBULATORY_CARE_PROVIDER_SITE_OTHER): Payer: 59 | Admitting: Family Medicine

## 2016-02-14 ENCOUNTER — Encounter: Payer: Self-pay | Admitting: Family Medicine

## 2016-02-14 VITALS — BP 120/74 | HR 64 | Temp 97.8°F | Ht 62.0 in | Wt 110.0 lb

## 2016-02-14 DIAGNOSIS — Z0000 Encounter for general adult medical examination without abnormal findings: Secondary | ICD-10-CM

## 2016-02-14 MED ORDER — HYDROCODONE-ACETAMINOPHEN 10-325 MG PO TABS: 1.0000 | ORAL_TABLET | Freq: Three times a day (TID) | ORAL | 0 refills | Status: DC | PRN

## 2016-02-14 MED ORDER — MOMETASONE FUROATE 50 MCG/ACT NA SUSP: 2.0000 | Freq: Every day | NASAL | 11 refills | Status: DC | PRN

## 2016-02-14 MED FILL — CELECOXIB 200 MG CAP: 200 | 30 days supply | Qty: 60 | Fill #3

## 2016-02-14 MED FILL — MOMETASONE FUROATE 50 MCG S: 50 | 30 days supply | Qty: 17 | Fill #0

## 2016-02-14 MED FILL — GABAPENTIN 300 MG CAPSULE: 300 | 30 days supply | Qty: 90 | Fill #3

## 2016-02-14 MED FILL — POLYETHYLENE GLYCOL 3350 PO: 90 days supply | Qty: 3162 | Fill #1

## 2016-02-14 NOTE — Progress Notes (Signed)
Pre visit review using our clinic review tool, if applicable. No additional management support is needed unless otherwise documented below in the visit note. 

## 2016-02-17 ENCOUNTER — Encounter: Payer: Self-pay | Admitting: Family Medicine

## 2016-02-17 NOTE — Progress Notes (Signed)
   Subjective:    Patient ID: Scherrie Gerlach, female    DOB: 10-21-1955, 61 y.o.   MRN: [id number]  HPI 61 yr old female for a well exam. She feels great except for arthritis type pains.    Review of Systems  Constitutional: Negative.   HENT: Negative.   Eyes: Negative.   Respiratory: Negative.   Cardiovascular: Negative.   Gastrointestinal: Negative.   Genitourinary: Negative for decreased urine volume, difficulty urinating, dyspareunia, dysuria, enuresis, flank pain, frequency, hematuria, pelvic pain and urgency.  Musculoskeletal: Positive for arthralgias, neck pain and neck stiffness.  Skin: Negative.   Neurological: Negative.   Psychiatric/Behavioral: Negative.        Objective:   Physical Exam  Constitutional: She is oriented to person, place, and time. She appears well-developed and well-nourished. No distress.  HENT:  Head: Normocephalic and atraumatic.  Right Ear: External ear normal.  Left Ear: External ear normal.  Nose: Nose normal.  Mouth/Throat: Oropharynx is clear and moist. No oropharyngeal exudate.  Eyes: Conjunctivae and EOM are normal. Pupils are equal, round, and reactive to light. No scleral icterus.  Neck: Normal range of motion. Neck supple. No JVD present. No thyromegaly present.  Cardiovascular: Normal rate, regular rhythm, normal heart sounds and intact distal pulses.  Exam reveals no gallop and no friction rub.   No murmur heard. Pulmonary/Chest: Effort normal and breath sounds normal. No respiratory distress. She has no wheezes. She has no rales. She exhibits no tenderness.  Abdominal: Soft. Bowel sounds are normal. She exhibits no distension and no mass. There is no tenderness. There is no rebound and no guarding.  Musculoskeletal: Normal range of motion. She exhibits no edema or tenderness.  Lymphadenopathy:    She has no cervical adenopathy.  Neurological: She is alert and oriented to person, place, and time. She has normal reflexes. No cranial  nerve deficit. She exhibits normal muscle tone. Coordination normal.  Skin: Skin is warm and dry. No rash noted. No erythema.  Psychiatric: She has a normal mood and affect. Her behavior is normal. Judgment and thought content normal.          Assessment & Plan:  Well exam. We discussed diet and exercise.  [name], MD

## 2016-02-18 DIAGNOSIS — M25512 Pain in left shoulder: Secondary | ICD-10-CM | POA: Diagnosis not present

## 2016-02-18 DIAGNOSIS — M25561 Pain in right knee: Secondary | ICD-10-CM | POA: Diagnosis not present

## 2016-02-18 DIAGNOSIS — M25511 Pain in right shoulder: Secondary | ICD-10-CM | POA: Diagnosis not present

## 2016-02-24 ENCOUNTER — Encounter: Payer: Self-pay | Admitting: Family Medicine

## 2016-02-24 MED ORDER — HYDROCODONE-HOMATROPINE 5-1.5 MG/5ML PO SYRP: 5.0000 mL | ORAL_SOLUTION | ORAL | 0 refills | Status: DC | PRN

## 2016-02-24 MED FILL — HYDROCODON-APAP 10-325: 10-325 | 30 days supply | Qty: 90 | Fill #0

## 2016-02-24 NOTE — Telephone Encounter (Signed)
done

## 2016-02-24 NOTE — Telephone Encounter (Signed)
Script is ready for pick up and I spoke with pt.  

## 2016-03-23 MED FILL — GABAPENTIN 300 MG CAPSULE: 300 | 30 days supply | Qty: 90 | Fill #4

## 2016-03-23 MED FILL — CELECOXIB 200 MG CAP: 200 | 30 days supply | Qty: 60 | Fill #4

## 2016-03-25 MED FILL — HYDROCODON-APAP 10-325: 10-325 | 30 days supply | Qty: 90 | Fill #0

## 2016-04-14 MED FILL — MOMETASONE FUROATE 50 MCG S: 50 | 30 days supply | Qty: 17 | Fill #1

## 2016-04-16 MED FILL — CELECOXIB 200 MG CAP: 200 | 30 days supply | Qty: 60 | Fill #5

## 2016-04-22 DIAGNOSIS — M67431 Ganglion, right wrist: Secondary | ICD-10-CM | POA: Diagnosis not present

## 2016-04-22 MED FILL — HYDROCODON-APAP 10-325: 10-325 | 30 days supply | Qty: 90 | Fill #0

## 2016-04-24 ENCOUNTER — Other Ambulatory Visit: Payer: Self-pay | Admitting: Orthopedic Surgery

## 2016-04-24 DIAGNOSIS — M67431 Ganglion, right wrist: Secondary | ICD-10-CM

## 2016-04-29 ENCOUNTER — Ambulatory Visit
Admission: RE | Admit: 2016-04-29 | Discharge: 2016-04-29 | Disposition: A | Discharge status: Home / Self Care | Payer: 59 | Source: Ambulatory Visit | Attending: Orthopedic Surgery | Admitting: Orthopedic Surgery

## 2016-04-29 DIAGNOSIS — M67431 Ganglion, right wrist: Secondary | ICD-10-CM

## 2016-04-29 MED ORDER — GADOBENATE DIMEGLUMINE 529 MG/ML IV SOLN
10.0000 mL | Freq: Once | INTRAVENOUS | Status: AC | PRN
  Administered 2016-04-29: 10 mL via INTRAVENOUS

## 2016-05-06 DIAGNOSIS — M67431 Ganglion, right wrist: Secondary | ICD-10-CM | POA: Diagnosis not present

## 2016-05-07 ENCOUNTER — Other Ambulatory Visit: Payer: Self-pay | Admitting: Orthopedic Surgery

## 2016-05-09 ENCOUNTER — Other Ambulatory Visit: Payer: Self-pay

## 2016-05-13 DIAGNOSIS — H2512 Age-related nuclear cataract, left eye: Secondary | ICD-10-CM | POA: Diagnosis not present

## 2016-05-13 DIAGNOSIS — Z970 Presence of artificial eye: Secondary | ICD-10-CM | POA: Diagnosis not present

## 2016-05-13 DIAGNOSIS — H04122 Dry eye syndrome of left lacrimal gland: Secondary | ICD-10-CM | POA: Diagnosis not present

## 2016-05-13 MED FILL — valACYclovir HCL 1 GM TABS: 1 | 90 days supply | Qty: 90 | Fill #0

## 2016-05-17 MED FILL — MOMETASONE FUROATE 50 MCG S: 50 | 30 days supply | Qty: 17 | Fill #2

## 2016-05-18 ENCOUNTER — Other Ambulatory Visit: Payer: Self-pay | Admitting: Family Medicine

## 2016-05-18 MED FILL — POLYETHYLENE GLYCOL 3350 PO: 90 days supply | Qty: 3162 | Fill #0

## 2016-05-18 MED FILL — GABAPENTIN 300 MG CAPSULE: 300 | 30 days supply | Qty: 90 | Fill #5

## 2016-05-19 ENCOUNTER — Telehealth: Payer: Self-pay | Admitting: Family Medicine

## 2016-05-19 NOTE — Telephone Encounter (Signed)
Refill request for Hydrocodone, pt is having surgery on wrist 05/25/2016.

## 2016-05-20 ENCOUNTER — Encounter (HOSPITAL_BASED_OUTPATIENT_CLINIC_OR_DEPARTMENT_OTHER): Payer: Self-pay | Admitting: *Deleted

## 2016-05-20 MED ORDER — HYDROCODONE-ACETAMINOPHEN 10-325 MG PO TABS: 1.0000 | ORAL_TABLET | Freq: Three times a day (TID) | ORAL | 0 refills | Status: DC | PRN

## 2016-05-20 NOTE — H&P (Signed)
[name] is an 61 y.o. female.   CC / Reason for Visit: Right wrist mass HPI: This patient returns to clinic today indicating she continues to have pain into the ulnar portion of her right wrist as well as some numbness into the dorsum of the ulnar aspect of the hand including the small finger.  She had an MRI and is also here for the results.    HPI: 04/22/2016:This patient returns for reevaluation of her right ulnar sided wrist mass.  It has been aspirated and thought to be a ganglion.  Aspirations occurred on 12-04-15 and 01-30-16.  She reports that it has recurred over the course of about a month each time, and is consistently painful.  She inquires about operative management.  HPI 01-30-16: This patient returns to clinic today indicating that the ganglion cyst on the ulnar aspect of her right wrist has returned.  She hopes to have it drained again today.  She indicates that she is no longer feeling numbness in the dorsum of her hand, but she does have some pain into her wrist when she utilizes her hand on the computer throughout the day.    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  Since she noticed a bump, she has been experiencing some altered sensibility on the dorsum of her hand as well as generalized weakness.  She arrives today to have her right wrist further evaluated.  Past Medical History:  Diagnosis Date  . Arthritis   . Depression   . Ganglion cyst of dorsum of right wrist   . Headache   . Osteoporosis   . Prosthetic eye globe     Past Surgical History:  Procedure Laterality Date  . ANTERIOR CERVICAL DECOMP/DISCECTOMY FUSION N/A 12/12/2014   Procedure: ANTERIOR CERVICAL DECOMPRESSION/DISCECTOMY FUSION 3 LEVELS;  Surgeon: Phylliss Bob, MD;  Location: [place];  Service: Orthopedics;  Laterality: N/A;  Anterior cervical  decompression fusion, cerivcal 5-6, cervical 6-7, cervical 7-thoracic 1 with instrumentation and allografrt  . APPENDECTOMY  1974  . COLONOSCOPY  04/14/2013   per Dr. Ardis Hughs, clear, repeat in 10 yrs   . CORNEAL TRANSPLANT Right 2007   x4  . ENUCLEATION Right 2013   eye infection  . RHINOPLASTY  1980    Family History  Problem Relation Age of Onset  . Colon cancer Neg Hx    Social History:  reports that she has never smoked. She has never used smokeless tobacco. She reports that she does not drink alcohol or use drugs.  Allergies: No Known Allergies  No prescriptions prior to admission.    No results found for this or any previous visit (from the past 48 hour(s)). No results found.  Review of Systems  All other systems reviewed and are negative.   Height 5' 1.5" (1.562 m), weight 49 kg (108 lb). Physical Exam  Constitutional:  WD, WN, NAD HEENT:  NCAT, EOMI Neuro/Psych:  Alert & oriented to person, place, and time; appropriate mood & affect Lymphatic: No generalized UE edema or lymphadenopathy Extremities / MSK:  Both UE are normal with respect to appearance, ranges of motion, joint stability, muscle strength/tone, sensation, & perfusion except as otherwise noted:  There is a soft mass on the right wrist just ulnar to the ulnar styloid that remains approximately 2 cm x 2 cm. The patient has full digital range  of motion as well as full wrist range of motion.  There is some discomfort with ulnar deviation.  There is some pain with palpation of the mass.  There is  altered sensibility with light touch on the dorsum of the hand ulnarly and into the small finger.  Labs / X-rays:  None today.  X-rays from one-4-18: 3 views of the right wrist were ordered and obtained today and demonstrate no fractures, dislocations, or bony lesions. MRI results indicate that the patient has a ulnar-sided 1.8 x 1.2 x 2.1 cm ganglion cyst along the volar ulnar aspect of the wrist.  Please see the  report for further information.   Assessment: Recurrent right ulnar sided wrist ganglion  Plan:  Findings were discussed with the patient.  At this point, she would like to move forward with right wrist ganglion excision. The details of the operative procedure were discussed with the patient.  Questions were invited and answered.  In addition to the goal of the procedure, the risks of the procedure to include but not limited to bleeding; infection; damage to the nerves or blood vessels that could result in bleeding, numbness, weakness, chronic pain, and the need for additional procedures; stiffness; the need for revision surgery; and anesthetic risks were reviewed.  No specific outcome was guaranteed or implied.  Informed consent was obtained.   Carrell Palmatier A., MD 05/20/2016, 4:26 PM

## 2016-05-20 NOTE — Telephone Encounter (Signed)
done

## 2016-05-20 NOTE — Telephone Encounter (Signed)
Script is ready for pick up and I spoke with pt.  

## 2016-05-25 ENCOUNTER — Ambulatory Visit (HOSPITAL_BASED_OUTPATIENT_CLINIC_OR_DEPARTMENT_OTHER): Payer: 59 | Admitting: Anesthesiology

## 2016-05-25 ENCOUNTER — Encounter (HOSPITAL_BASED_OUTPATIENT_CLINIC_OR_DEPARTMENT_OTHER): Payer: Self-pay | Admitting: *Deleted

## 2016-05-25 ENCOUNTER — Encounter (HOSPITAL_BASED_OUTPATIENT_CLINIC_OR_DEPARTMENT_OTHER)
Admission: RE | Disposition: A | Discharge status: Home / Self Care | Payer: Self-pay | Source: Ambulatory Visit | Attending: Orthopedic Surgery

## 2016-05-25 ENCOUNTER — Ambulatory Visit (HOSPITAL_BASED_OUTPATIENT_CLINIC_OR_DEPARTMENT_OTHER)
Admission: RE | Admit: 2016-05-25 | Discharge: 2016-05-25 | Disposition: A | Discharge status: Home / Self Care | Payer: 59 | Source: Ambulatory Visit | Attending: Orthopedic Surgery | Admitting: Orthopedic Surgery

## 2016-05-25 DIAGNOSIS — J309 Allergic rhinitis, unspecified: Secondary | ICD-10-CM | POA: Diagnosis not present

## 2016-05-25 DIAGNOSIS — R51 Headache: Secondary | ICD-10-CM | POA: Insufficient documentation

## 2016-05-25 DIAGNOSIS — Z970 Presence of artificial eye: Secondary | ICD-10-CM | POA: Diagnosis not present

## 2016-05-25 DIAGNOSIS — M1990 Unspecified osteoarthritis, unspecified site: Secondary | ICD-10-CM | POA: Diagnosis not present

## 2016-05-25 DIAGNOSIS — Z981 Arthrodesis status: Secondary | ICD-10-CM | POA: Insufficient documentation

## 2016-05-25 DIAGNOSIS — F329 Major depressive disorder, single episode, unspecified: Secondary | ICD-10-CM | POA: Insufficient documentation

## 2016-05-25 DIAGNOSIS — M810 Age-related osteoporosis without current pathological fracture: Secondary | ICD-10-CM | POA: Diagnosis not present

## 2016-05-25 DIAGNOSIS — M67431 Ganglion, right wrist: Secondary | ICD-10-CM | POA: Diagnosis not present

## 2016-05-25 HISTORY — DX: Ganglion, right wrist: M67.431

## 2016-05-25 HISTORY — PX: GANGLION CYST EXCISION: SHX1691

## 2016-05-25 SURGERY — EXCISION, GANGLION CYST, WRIST
Anesthesia: General | Site: Wrist | Laterality: Right

## 2016-05-25 MED ORDER — CEFAZOLIN SODIUM-DEXTROSE 2-4 GM/100ML-% IV SOLN
2.0000 g | INTRAVENOUS | Status: AC
  Administered 2016-05-25: 2 g via INTRAVENOUS

## 2016-05-25 MED ORDER — METOCLOPRAMIDE HCL 5 MG/ML IJ SOLN: 10.0000 mg | Freq: Once | INTRAMUSCULAR | Status: DC | PRN

## 2016-05-25 MED ORDER — BUPIVACAINE-EPINEPHRINE 0.5% -1:200000 IJ SOLN
INTRAMUSCULAR | Status: DC | PRN
  Administered 2016-05-25: 7 mL

## 2016-05-25 MED ORDER — MIDAZOLAM HCL 2 MG/2ML IJ SOLN
1.0000 mg | INTRAMUSCULAR | Status: DC | PRN
  Administered 2016-05-25: 1 mg via INTRAVENOUS

## 2016-05-25 MED ORDER — LACTATED RINGERS IV SOLN
INTRAVENOUS | Status: DC
  Administered 2016-05-25 (×3): via INTRAVENOUS

## 2016-05-25 MED ORDER — PROPOFOL 10 MG/ML IV BOLUS
INTRAVENOUS | Status: DC | PRN
  Administered 2016-05-25: 150 mg via INTRAVENOUS

## 2016-05-25 MED ORDER — ONDANSETRON HCL 4 MG/2ML IJ SOLN
INTRAMUSCULAR | Status: DC | PRN
  Administered 2016-05-25: 4 mg via INTRAVENOUS

## 2016-05-25 MED ORDER — MIDAZOLAM HCL 2 MG/2ML IJ SOLN
INTRAMUSCULAR | Status: AC
  Filled 2016-05-25: qty 2

## 2016-05-25 MED ORDER — OXYCODONE HCL 5 MG PO TABS
ORAL_TABLET | ORAL | Status: AC
  Filled 2016-05-25: qty 1

## 2016-05-25 MED ORDER — FENTANYL CITRATE (PF) 100 MCG/2ML IJ SOLN
50.0000 ug | INTRAMUSCULAR | Status: DC | PRN
Start: 2016-05-25 — End: 2016-05-25
  Administered 2016-05-25 (×2): 50 ug via INTRAVENOUS

## 2016-05-25 MED ORDER — FENTANYL CITRATE (PF) 100 MCG/2ML IJ SOLN
INTRAMUSCULAR | Status: AC
  Filled 2016-05-25: qty 2

## 2016-05-25 MED ORDER — LACTATED RINGERS IV SOLN: INTRAVENOUS | Status: DC

## 2016-05-25 MED ORDER — CEFAZOLIN SODIUM-DEXTROSE 2-4 GM/100ML-% IV SOLN
INTRAVENOUS | Status: AC
  Filled 2016-05-25: qty 100

## 2016-05-25 MED ORDER — MEPERIDINE HCL 25 MG/ML IJ SOLN: 6.2500 mg | INTRAMUSCULAR | Status: DC | PRN

## 2016-05-25 MED ORDER — SCOPOLAMINE 1 MG/3DAYS TD PT72: 1.0000 | MEDICATED_PATCH | Freq: Once | TRANSDERMAL | Status: DC | PRN

## 2016-05-25 MED ORDER — OXYCODONE HCL 5 MG PO TABS
5.0000 mg | ORAL_TABLET | Freq: Once | ORAL | Status: AC
  Administered 2016-05-25: 5 mg via ORAL

## 2016-05-25 MED ORDER — DEXAMETHASONE SODIUM PHOSPHATE 10 MG/ML IJ SOLN
INTRAMUSCULAR | Status: DC | PRN
  Administered 2016-05-25: 10 mg via INTRAVENOUS

## 2016-05-25 MED ORDER — LIDOCAINE HCL (CARDIAC) 20 MG/ML IV SOLN
INTRAVENOUS | Status: DC | PRN
  Administered 2016-05-25: 30 mg via INTRAVENOUS

## 2016-05-25 MED ORDER — FENTANYL CITRATE (PF) 100 MCG/2ML IJ SOLN
25.0000 ug | INTRAMUSCULAR | Status: DC | PRN
  Administered 2016-05-25 (×2): 25 ug via INTRAVENOUS
  Administered 2016-05-25: 50 ug via INTRAVENOUS
  Administered 2016-05-25: 25 ug via INTRAVENOUS

## 2016-05-25 MED FILL — HYDROCODON-APAP 10-325: 10-325 | 30 days supply | Qty: 90 | Fill #0

## 2016-05-25 SURGICAL SUPPLY — 43 items
BLADE MINI RND TIP GREEN BEAV (BLADE) IMPLANT
BLADE SURG 15 STRL LF DISP TIS (BLADE) ×1 IMPLANT
BLADE SURG 15 STRL SS (BLADE) ×2
BNDG COHESIVE 4X5 TAN STRL (GAUZE/BANDAGES/DRESSINGS) ×2 IMPLANT
BNDG ESMARK 4X9 LF (GAUZE/BANDAGES/DRESSINGS) ×2 IMPLANT
BNDG GAUZE ELAST 4 BULKY (GAUZE/BANDAGES/DRESSINGS) ×2 IMPLANT
BNDG PLASTER X FAST 3X3 WHT LF (CAST SUPPLIES) ×10 IMPLANT
BNDG PLSTR 9X3 FST ST WHT (CAST SUPPLIES) ×5
CHLORAPREP W/TINT 26ML (MISCELLANEOUS) ×2 IMPLANT
CORDS BIPOLAR (ELECTRODE) IMPLANT
COVER BACK TABLE 60X90IN (DRAPES) ×2 IMPLANT
COVER MAYO STAND STRL (DRAPES) ×2 IMPLANT
CUFF TOURNIQUET SINGLE 18IN (TOURNIQUET CUFF) ×2 IMPLANT
DRAPE EXTREMITY T 121X128X90 (DRAPE) ×2 IMPLANT
DRAPE SURG 17X23 STRL (DRAPES) ×2 IMPLANT
DRSG EMULSION OIL 3X3 NADH (GAUZE/BANDAGES/DRESSINGS) ×2 IMPLANT
GAUZE SPONGE 4X4 12PLY STRL LF (GAUZE/BANDAGES/DRESSINGS) ×2 IMPLANT
GLOVE BIO SURGEON STRL SZ 6.5 (GLOVE) ×2 IMPLANT
GLOVE BIO SURGEON STRL SZ7.5 (GLOVE) ×2 IMPLANT
GLOVE BIOGEL PI IND STRL 7.0 (GLOVE) ×3 IMPLANT
GLOVE BIOGEL PI IND STRL 8 (GLOVE) ×1 IMPLANT
GLOVE BIOGEL PI INDICATOR 7.0 (GLOVE) ×3
GLOVE BIOGEL PI INDICATOR 8 (GLOVE) ×1
GLOVE ECLIPSE 6.5 STRL STRAW (GLOVE) ×2 IMPLANT
GOWN STRL REUS W/ TWL LRG LVL3 (GOWN DISPOSABLE) ×2 IMPLANT
GOWN STRL REUS W/TWL LRG LVL3 (GOWN DISPOSABLE) ×4
GOWN STRL REUS W/TWL XL LVL3 (GOWN DISPOSABLE) ×2 IMPLANT
NEEDLE HYPO 25X1 1.5 SAFETY (NEEDLE) ×2 IMPLANT
NS IRRIG 1000ML POUR BTL (IV SOLUTION) ×2 IMPLANT
PACK BASIN DAY SURGERY FS (CUSTOM PROCEDURE TRAY) ×2 IMPLANT
PADDING CAST ABS 4INX4YD NS (CAST SUPPLIES) ×1
PADDING CAST ABS COTTON 4X4 ST (CAST SUPPLIES) ×1 IMPLANT
RUBBERBAND STERILE (MISCELLANEOUS) IMPLANT
STOCKINETTE 6  STRL (DRAPES) ×1
STOCKINETTE 6 STRL (DRAPES) ×1 IMPLANT
SUT VIC AB 2-0 CT3 27 (SUTURE) ×2 IMPLANT
SUT VICRYL RAPIDE 4-0 (SUTURE) IMPLANT
SUT VICRYL RAPIDE 4/0 PS 2 (SUTURE) ×2 IMPLANT
SYR 10ML LL (SYRINGE) IMPLANT
SYR BULB 3OZ (MISCELLANEOUS) IMPLANT
TOWEL OR 17X24 6PK STRL BLUE (TOWEL DISPOSABLE) ×2 IMPLANT
TOWEL OR NON WOVEN STRL DISP B (DISPOSABLE) ×2 IMPLANT
UNDERPAD 30X30 (UNDERPADS AND DIAPERS) IMPLANT

## 2016-05-25 NOTE — Interval H&P Note (Signed)
History and Physical Interval Note:  05/25/2016 11:02 AM  [name]  has presented today for surgery, with the diagnosis of RIGHT WRIST ULNAR GANGLION [phone]  The various methods of treatment have been discussed with the patient and family. After consideration of risks, benefits and other options for treatment, the patient has consented to  Procedure(s): RIGHT WRIST ULNAR GANGLION EXCISION (Right) as a surgical intervention .  The patient's history has been reviewed, patient examined, no change in status, stable for surgery.  I have reviewed the patient's chart and labs.  Questions were answered to the patient's satisfaction.     Levander Katzenstein A.

## 2016-05-25 NOTE — Discharge Instructions (Signed)
Discharge Instructions   You have a dressing with a plaster splint incorporated in it. Move your fingers as much as possible, making a full fist and fully opening the fist. Elevate your hand above your elbow to reduce pain & swelling of the digits.  Ice over the operative site and in the arm pit may be helpful to reduce pain & swelling.  DO NOT USE HEAT. Resume Celebrex and your normally prescribed pain medicine regimen Leave the dressing in place until you return to our office.  You may shower, but keep the bandage clean & dry.  You may drive a car when you are off of prescription pain medications and can safely control your vehicle with both hands. Call and contact our office to arrange an appointment in 10-15 days from the date of surgery.   Please call [phone] during normal business hours or [phone] after hours for any problems. Including the following:  - excessive redness of the incisions - drainage for more than 4 days - fever of more than 101.5 F  *Please note that pain medications will not be refilled after hours or on weekends.  Return to work Thursday 04/28/2016 with no lifting, gripping or grasping greater than pencil and paper tasks with the right arm.       Post Anesthesia Home Care Instructions  Activity: Get plenty of rest for the remainder of the day. A responsible individual must stay with you for 24 hours following the procedure.  For the next 24 hours, DO NOT: -Drive a car -Paediatric nurse -Drink alcoholic beverages -Take any medication unless instructed by your physician -Make any legal decisions or sign important papers.  Meals: Start with liquid foods such as gelatin or soup. Progress to regular foods as tolerated. Avoid greasy, spicy, heavy foods. If nausea and/or vomiting occur, drink only clear liquids until the nausea and/or vomiting subsides. Call your physician if vomiting continues.  Special Instructions/Symptoms: Your throat may feel  dry or sore from the anesthesia or the breathing tube placed in your throat during surgery. If this causes discomfort, gargle with warm salt water. The discomfort should disappear within 24 hours.  If you had a scopolamine patch placed behind your ear for the management of post- operative nausea and/or vomiting:  1. The medication in the patch is effective for 72 hours, after which it should be removed.  Wrap patch in a tissue and discard in the trash. Wash hands thoroughly with soap and water. 2. You may remove the patch earlier than 72 hours if you experience unpleasant side effects which may include dry mouth, dizziness or visual disturbances. 3. Avoid touching the patch. Wash your hands with soap and water after contact with the patch.

## 2016-05-25 NOTE — Anesthesia Preprocedure Evaluation (Signed)
Anesthesia Evaluation  Patient identified by MRN, date of birth, ID band Patient awake    Reviewed: Allergy & Precautions, NPO status , Patient's Chart, lab work & pertinent test results  Airway Mallampati: II  TM Distance: >3 FB Neck ROM: Full    Dental no notable dental hx.    Pulmonary neg pulmonary ROS,    Pulmonary exam normal breath sounds clear to auscultation       Cardiovascular negative cardio ROS Normal cardiovascular exam Rhythm:Regular Rate:Normal     Neuro/Psych negative neurological ROS  negative psych ROS   GI/Hepatic negative GI ROS, Neg liver ROS,   Endo/Other  negative endocrine ROS  Renal/GU negative Renal ROS  negative genitourinary   Musculoskeletal negative musculoskeletal ROS (+)   Abdominal   Peds negative pediatric ROS (+)  Hematology negative hematology ROS (+)   Anesthesia Other Findings   Reproductive/Obstetrics negative OB ROS                             Anesthesia Physical Anesthesia Plan  ASA: II  Anesthesia Plan: General   Post-op Pain Management:    Induction: Intravenous  Airway Management Planned: LMA  Additional Equipment:   Intra-op Plan:   Post-operative Plan: Extubation in OR  Informed Consent: I have reviewed the patients History and Physical, chart, labs and discussed the procedure including the risks, benefits and alternatives for the proposed anesthesia with the patient or authorized representative who has indicated his/her understanding and acceptance.   Dental advisory given  Plan Discussed with: CRNA  Anesthesia Plan Comments:         Anesthesia Quick Evaluation

## 2016-05-25 NOTE — Anesthesia Procedure Notes (Signed)
Procedure Name: LMA Insertion Date/Time: 05/25/2016 11:19 AM Performed by: Akisha Sturgill D Pre-anesthesia Checklist: Patient identified, Emergency Drugs available, Suction available and Patient being monitored Patient Re-evaluated:Patient Re-evaluated prior to inductionOxygen Delivery Method: Circle system utilized Preoxygenation: Pre-oxygenation with 100% oxygen Intubation Type: IV induction Ventilation: Mask ventilation without difficulty LMA: LMA inserted LMA Size: 3.0 Number of attempts: 1 Airway Equipment and Method: Bite block Placement Confirmation: positive ETCO2 Tube secured with: Tape Dental Injury: Teeth and Oropharynx as per pre-operative assessment

## 2016-05-25 NOTE — Op Note (Signed)
05/25/2016  11:03 AM  PATIENT:  [name]  61 y.o. female  PRE-OPERATIVE DIAGNOSIS:  Right ulnar wrist mass--suspected ganglion cyst  POST-OPERATIVE DIAGNOSIS:  Same--confirmed ganglion  PROCEDURE:  Right wrist ganglion cyst excision  SURGEON: Rayvon Char. Grandville Silos, MD  PHYSICIAN ASSISTANT: Morley Kos, OPA-C  ANESTHESIA:  general  SPECIMENS:  To pathology  DRAINS:   None  EBL:  less than 50 mL  PREOPERATIVE INDICATIONS:  [name] is a  61 y.o. female with a painful right wrist ganglion cyst on the ulnar side that has recurred despite aspiration and remains painful when enlarged.  The risks benefits and alternatives were discussed with the patient preoperatively including but not limited to the risks of infection, bleeding, nerve injury, cardiopulmonary complications, the need for revision surgery, among others, and the patient verbalized understanding and consented to proceed.  OPERATIVE IMPLANTS: none  OPERATIVE PROCEDURE:  After receiving prophylactic antibiotics, the patient was escorted to the operative theatre and placed in a supine position.  A surgical "time-out" was performed during which the planned procedure, proposed operative site, and the correct patient identity were compared to the operative consent and agreement confirmed by the circulating nurse according to current facility policy.  Following application of a tourniquet to the operative extremity, the exposed skin was prepped with Chloraprep and draped in the usual sterile fashion.  The limb was exsanguinated with an Esmarch bandage and the tourniquet inflated to approximately 172mmHg higher than systolic BP.  A direct linear longitudinal incision was made over the prominence of the mass on the ulnar side of the wrist.  The skin was incised sharply with scalpel, subcutaneous tissues dissected with blunt spreading dissection.  The mass was meticulously dissected off of the ulna, and the space between the  FCU and ECU, and distally the retinaculum was split to allow further dissection of the mass.  Ultimately, it became punctured and much of the gelatinous fluid escaped.  This allowed for completion of it, as it originated from the volar ulnar portion of the radial carpal joint, just distal to the palmar radial ulnar ligament.  The articular cartilage was evident, inspected, and found to be in good condition.  The wound was irrigated and 2-0 Vicryl sutures used to close to perforation in the capsule.  The degree to which the mass had indented the ulna was also smoothed and debrided with a rongeur.  Tourniquet was released, some additional hemostasis obtained and the skin was closed with 4-0 Vicryl Rapide interrupted subcuticular suture and running horizontal mattress suture.  Local anesthetic was administered for postoperative pain control.  Short arm dressing was applied without plaster component and she was awakened and taken to the recovery room in stable condition, breathing spontaneously.  DISPOSITION: She'll be discharged home today with typical instructions, returning at 10-15 days for reevaluation.

## 2016-05-25 NOTE — Transfer of Care (Signed)
Immediate Anesthesia Transfer of Care Note  Patient: [name]  Procedure(s) Performed: Procedure(s): RIGHT WRIST ULNAR GANGLION EXCISION (Right)  Patient Location: PACU  Anesthesia Type:General  Level of Consciousness: awake and patient cooperative  Airway & Oxygen Therapy: Patient Spontanous Breathing and Patient connected to face mask oxygen  Post-op Assessment: Report given to RN and Post -op Vital signs reviewed and stable  Post vital signs: Reviewed and stable  Last Vitals:  Vitals:   05/25/16 0925  BP: 120/67  Pulse: (!) 59  Resp: 18  Temp: 36.5 C    Last Pain:  Vitals:   05/25/16 0925  TempSrc: Oral         Complications: No apparent anesthesia complications

## 2016-05-25 NOTE — Anesthesia Postprocedure Evaluation (Signed)
Anesthesia Post Note  Patient: [name]  Procedure(s) Performed: Procedure(s) (LRB): RIGHT WRIST ULNAR GANGLION EXCISION (Right)  Patient location during evaluation: PACU Anesthesia Type: General Level of consciousness: awake and alert Pain management: pain level controlled Vital Signs Assessment: post-procedure vital signs reviewed and stable Respiratory status: spontaneous breathing, nonlabored ventilation, respiratory function stable and patient connected to nasal cannula oxygen Cardiovascular status: blood pressure returned to baseline and stable Postop Assessment: no signs of nausea or vomiting Anesthetic complications: no       Last Vitals:  Vitals:   05/25/16 1215 05/25/16 1230  BP: 96/72 119/70  Pulse: 87 68  Resp: 15 14  Temp:      Last Pain:  Vitals:   05/25/16 1230  TempSrc:   PainSc: 7                  Montez Hageman

## 2016-05-26 ENCOUNTER — Encounter (HOSPITAL_BASED_OUTPATIENT_CLINIC_OR_DEPARTMENT_OTHER): Payer: Self-pay | Admitting: Orthopedic Surgery

## 2016-06-02 MED FILL — CELECOXIB 200 MG CAP: 200 | 30 days supply | Qty: 60 | Fill #6

## 2016-06-03 ENCOUNTER — Ambulatory Visit (INDEPENDENT_AMBULATORY_CARE_PROVIDER_SITE_OTHER): Payer: 59 | Admitting: Podiatry

## 2016-06-03 DIAGNOSIS — B351 Tinea unguium: Secondary | ICD-10-CM | POA: Diagnosis not present

## 2016-06-03 MED ORDER — NONFORMULARY OR COMPOUNDED ITEM: 0 refills | Status: DC

## 2016-06-05 NOTE — Progress Notes (Signed)
   Subjective: Patient presents today for possible treatment and evaluation of fungal nails of the bilateral great toes that have been present for the past 3 years. Patient states that the nails have been discolored and thickened and are coming off. She states she was on Lamisil for 6 months in the past. Patient presents today for further treatment and evaluation.  Objective: Physical Exam General: The patient is alert and oriented x3 in no acute distress.  Dermatology: Hyperkeratotic, discolored, thickened, onychodystrophy of nails noted bilaterally.  Skin is warm, dry and supple bilateral lower extremities. Negative for open lesions or macerations.  Vascular: Palpable pedal pulses bilaterally. No edema or erythema noted. Capillary refill within normal limits.  Neurological: Epicritic and protective threshold grossly intact bilaterally.   Musculoskeletal Exam: Range of motion within normal limits to all pedal and ankle joints bilateral. Muscle strength 5/5 in all groups bilateral.   Assessment: #1 onychodystrophy bilateral toenails #2 possible onychomycosis #3 hyperkeratotic nails bilateral #4 h/o Hepatitis C-resolved  Plan of Care:  #1 Patient was evaluated. #2 Prescription for antifungal nail lacquer from [facility] #3 Pt recently completed 6 months of Lamisil #4 Recommended laser nail treatment; patient cannot afford it. #5 Return to clinic in 6 months.   Edrick Kins, DPM [facility]  Dr. Edrick Kins, [place]                                        [place],  [address]                Office [phone]  Fax [phone]

## 2016-06-08 DIAGNOSIS — M67431 Ganglion, right wrist: Secondary | ICD-10-CM | POA: Diagnosis not present

## 2016-06-18 ENCOUNTER — Telehealth: Payer: Self-pay | Admitting: Family Medicine

## 2016-06-18 MED ORDER — HYDROCODONE-ACETAMINOPHEN 10-325 MG PO TABS: 1.0000 | ORAL_TABLET | Freq: Three times a day (TID) | ORAL | 0 refills | Status: DC | PRN

## 2016-06-18 NOTE — Telephone Encounter (Signed)
Script is ready for pick up here at front office and I spoke with pt.  

## 2016-06-18 NOTE — Telephone Encounter (Signed)
Refill request for Norco. 

## 2016-06-18 NOTE — Telephone Encounter (Signed)
done

## 2016-06-24 ENCOUNTER — Other Ambulatory Visit: Payer: Self-pay | Admitting: Family Medicine

## 2016-06-24 MED FILL — HYDROCODON-APAP 10-325: 10-325 | 30 days supply | Qty: 90 | Fill #0

## 2016-06-24 MED FILL — GABAPENTIN 300 MG CAPSULE: 300 | 30 days supply | Qty: 90 | Fill #0

## 2016-06-30 ENCOUNTER — Other Ambulatory Visit: Payer: Self-pay | Admitting: Family Medicine

## 2016-06-30 MED FILL — CELECOXIB 200 MG CAP: 200 | 30 days supply | Qty: 60 | Fill #0

## 2016-06-30 NOTE — Telephone Encounter (Signed)
Per Dr. Fry okay to refill for 6 months.  

## 2016-07-01 MED FILL — MOMETASONE FUROATE 50 MCG S: 50 | 30 days supply | Qty: 17 | Fill #3

## 2016-07-13 DIAGNOSIS — M67431 Ganglion, right wrist: Secondary | ICD-10-CM | POA: Diagnosis not present

## 2016-07-21 ENCOUNTER — Telehealth: Payer: Self-pay | Admitting: Family Medicine

## 2016-07-21 ENCOUNTER — Other Ambulatory Visit: Payer: Self-pay | Admitting: Emergency Medicine

## 2016-07-21 MED ORDER — HYDROCODONE-ACETAMINOPHEN 10-325 MG PO TABS: 1.0000 | ORAL_TABLET | Freq: Three times a day (TID) | ORAL | 0 refills | Status: DC | PRN

## 2016-07-21 NOTE — Telephone Encounter (Signed)
Patient would like to wait until dr. Sarajane Jews returns

## 2016-07-21 NOTE — Telephone Encounter (Signed)
Refill request for Norco. 

## 2016-07-21 NOTE — Telephone Encounter (Signed)
Ok to refill  42 # in dr Sarajane Jews absence    further refills per dr Sarajane Jews .

## 2016-07-27 ENCOUNTER — Telehealth: Payer: Self-pay | Admitting: Family Medicine

## 2016-07-27 MED ORDER — HYDROCODONE-ACETAMINOPHEN 10-325 MG PO TABS: 1.0000 | ORAL_TABLET | Freq: Three times a day (TID) | ORAL | 0 refills | Status: DC | PRN

## 2016-07-27 MED FILL — GABAPENTIN 300 MG CAPSULE: 300 | 30 days supply | Qty: 90 | Fill #1

## 2016-07-27 MED FILL — HYDROCODON-APAP 10-325: 10-325 | 30 days supply | Qty: 90 | Fill #0

## 2016-07-27 MED FILL — MOMETASONE FUROATE 50 MCG S: 50 | 30 days supply | Qty: 17 | Fill #4

## 2016-07-27 MED FILL — CELECOXIB 200 MG CAP: 200 | 30 days supply | Qty: 60 | Fill #1

## 2016-07-27 MED FILL — TRIAMCINOLONE 0.1% CREAM: 0.1 | 21 days supply | Qty: 60 | Fill #1

## 2016-07-27 NOTE — Telephone Encounter (Signed)
Script is ready for pick up here at front office and I spoke with pt.  

## 2016-07-27 NOTE — Telephone Encounter (Signed)
done

## 2016-07-27 NOTE — Telephone Encounter (Signed)
Refill request for Norco. 

## 2016-08-17 MED FILL — PREVIDENT 5000 1.1% DRY MOU: 1.1 | 30 days supply | Qty: 100 | Fill #0

## 2016-08-17 MED FILL — valACYclovir HCL 1 GM TABS: 1 | 90 days supply | Qty: 90 | Fill #1

## 2016-08-24 ENCOUNTER — Telehealth: Payer: Self-pay | Admitting: Family Medicine

## 2016-08-24 MED ORDER — HYDROCODONE-ACETAMINOPHEN 10-325 MG PO TABS: 1.0000 | ORAL_TABLET | Freq: Three times a day (TID) | ORAL | 0 refills | Status: DC | PRN

## 2016-08-24 NOTE — Telephone Encounter (Signed)
Refill request for Hydrocodone.  

## 2016-08-24 NOTE — Telephone Encounter (Signed)
Script is ready for pick up here at front office and I spoke with pt.  

## 2016-08-24 NOTE — Telephone Encounter (Signed)
done

## 2016-08-27 MED FILL — HYDROCODON-APAP 10-325: 10-325 | 30 days supply | Qty: 90 | Fill #0

## 2016-08-28 ENCOUNTER — Ambulatory Visit (INDEPENDENT_AMBULATORY_CARE_PROVIDER_SITE_OTHER): Payer: 59 | Admitting: Family Medicine

## 2016-08-28 ENCOUNTER — Encounter: Payer: Self-pay | Admitting: Family Medicine

## 2016-08-28 VITALS — BP 142/88 | HR 88 | Temp 97.7°F | Ht 61.5 in | Wt 109.6 lb

## 2016-08-28 DIAGNOSIS — B86 Scabies: Secondary | ICD-10-CM

## 2016-08-28 MED ORDER — HYDROXYZINE HCL 25 MG PO TABS: 25.0000 mg | ORAL_TABLET | ORAL | 1 refills | Status: DC | PRN

## 2016-08-28 MED ORDER — PERMETHRIN 5 % EX CREA: 1.0000 "application " | TOPICAL_CREAM | Freq: Once | CUTANEOUS | 0 refills | Status: AC

## 2016-08-28 MED FILL — hydrOXYzine HCL 25 MG TABS: 25 | 10 days supply | Qty: 60 | Fill #0

## 2016-08-28 MED FILL — PERMETHRIN 5% CREAM: 5 | 14 days supply | Qty: 60 | Fill #0

## 2016-08-28 NOTE — Progress Notes (Signed)
   Subjective:    Patient ID: [name], female    DOB: [birth year], 61 y.o.   MRN: [id number]  HPI Here for 4 days of widespread itchy bumps over the body. No fever. She feels fine in general. She notes that she went hiking in the woods last weekend and spent some time sitting on logs and in a gazebo in the woods.    Review of Systems  Constitutional: Negative.   Respiratory: Negative.   Cardiovascular: Negative.   Skin: Positive for rash.       Objective:   Physical Exam  Constitutional: She appears well-developed and well-nourished.  Cardiovascular: Normal rate, regular rhythm, normal heart sounds and intact distal pulses.   Pulmonary/Chest: Effort normal and breath sounds normal. No respiratory distress. She has no wheezes. She has no rales.  Skin:  Widespread isolated red papules and vesicles on the trunk, arms, and legs. Many of these are arranged in linear distributions along the underwear lines and short lines of her clothing          Assessment & Plan:  Scabies. Treat with 5% Permethrin cream. Apply at bedtime tonight and leave on overnight, shower off in the morning. Use Hydroxyzine for itching. Wash bed linens in hot water.  [name], MD

## 2016-09-02 ENCOUNTER — Other Ambulatory Visit: Payer: Self-pay | Admitting: Family Medicine

## 2016-09-02 DIAGNOSIS — Z1231 Encounter for screening mammogram for malignant neoplasm of breast: Secondary | ICD-10-CM

## 2016-09-04 MED FILL — CELECOXIB 200 MG CAPS: 200 | 30 days supply | Qty: 60 | Fill #2

## 2016-09-22 MED FILL — MOMETASONE FUROATE 50 MCG S: 50 | 30 days supply | Qty: 17 | Fill #5

## 2016-09-23 MED FILL — GABAPENTIN 300 MG CAPSULE: 300 | 30 days supply | Qty: 90 | Fill #2

## 2016-09-24 ENCOUNTER — Telehealth: Payer: Self-pay | Admitting: Family Medicine

## 2016-09-24 MED ORDER — HYDROCODONE-ACETAMINOPHEN 10-325 MG PO TABS: 1.0000 | ORAL_TABLET | Freq: Three times a day (TID) | ORAL | 0 refills | Status: DC | PRN

## 2016-09-24 NOTE — Telephone Encounter (Signed)
done

## 2016-09-24 NOTE — Telephone Encounter (Signed)
Script is ready for pick up here at front office and I spoke with pt.  

## 2016-09-24 NOTE — Telephone Encounter (Signed)
Refill request for Norco. 

## 2016-09-30 MED FILL — HYDROCODON-APAP 10-325: 10-325 | 30 days supply | Qty: 90 | Fill #0

## 2016-10-01 ENCOUNTER — Other Ambulatory Visit: Payer: Self-pay | Admitting: *Deleted

## 2016-10-01 NOTE — Patient Outreach (Addendum)
[facility]) Care Management  10/01/2016  ALMER LITTLETON 1955/10/26 [id number]   Subjective: Received voicemail message from patient, request recertification of benefit exception for upcoming prosthetic eye cleaning.   States her appointment is on 10/19/16 and request call back. Telephone call to patient's home  / mobile number, no answer, left HIPAA compliant voicemail message, and requested call back.  Objective:  Patient has had benefit exceptions in the past for prosthetic eye cleaning in the past and approval will continue to be ongoing per [facility].    Assessment: Received verbal request from patient on 09/25/15 to start the benefit exception recertification process for cleaning of right eye prosthetic.  Benefit exception recertification request sent to [place] Management Assistant Clinical Director Kingsley Callander Pulliam) and decision in progress.    Plan: RNCM will send case status update request to [name] [place] [facility].  RNCM will follow up on insurance authorization within 3 business weeks.  RNCM will call patient within 3business weeks to give status update on benefit exception.  RNCM will call provider ([place]) within 3business weeks to give status update on benefit exception.    Alonie Gazzola H. Annia Friendly, BSN, [place] Management [facility] Telephonic CM Phone: [phone] Fax: [phone]

## 2016-10-06 MED FILL — CELECOXIB 200 MG CAPS: 200 | 30 days supply | Qty: 60 | Fill #3

## 2016-10-09 ENCOUNTER — Ambulatory Visit: Payer: Self-pay

## 2016-10-09 MED FILL — POLYETHYLENE GLYCOL 3350 PO: 90 days supply | Qty: 3162 | Fill #1

## 2016-10-12 ENCOUNTER — Other Ambulatory Visit: Payer: Self-pay | Admitting: *Deleted

## 2016-10-12 NOTE — Patient Outreach (Signed)
[facility]) Care Management  10/12/2016  [name] [birth year] [id number]   Subjective: Received voicemail message from [name], requesting update on benefit exception request.  Telephone call to patient's home / mobile number, spoke with patient, and HIPAA verified.  Discussed  [facility] benefit exception process, patient voiced understanding, and is in agreement to follow up when decision rendered.  Patient advised benefit exception request has been sent and decision pending.  States the appointment is 11/19/16 vs 10/20/16.   Objective:  Patient has had benefit exceptions in the past for prosthetic eye cleaning in the past and approval will continue to be ongoing per [facility].    Assessment: Received verbal request from patient on 09/25/15 to start the benefit exception recertification process for cleaning of right eye prosthetic.  Benefit exception recertification request has been sent to [place] Management Assistant Clinical Director Kingsley Callander Pulliam) and decision in progress.    Plan: RNCM will follow up on insurance authorization within 2 business weeks.  RNCM will call patient within 2business weeks to give status update on benefit exception.  RNCM will call provider ([place]) within 2business weeks to give status update on benefit exception.    [place] Sarinana H. Annia Friendly, BSN, [place] Management [facility] Telephonic CM Phone: [phone] Fax: [phone]

## 2016-10-15 ENCOUNTER — Encounter: Payer: Self-pay | Admitting: Family Medicine

## 2016-10-21 ENCOUNTER — Ambulatory Visit
Admission: RE | Admit: 2016-10-21 | Discharge: 2016-10-21 | Disposition: A | Discharge status: Home / Self Care | Payer: 59 | Source: Ambulatory Visit | Attending: Family Medicine | Admitting: Family Medicine

## 2016-10-21 DIAGNOSIS — Z1231 Encounter for screening mammogram for malignant neoplasm of breast: Secondary | ICD-10-CM | POA: Diagnosis not present

## 2016-10-23 ENCOUNTER — Other Ambulatory Visit: Payer: Self-pay | Admitting: Family Medicine

## 2016-10-23 DIAGNOSIS — R928 Other abnormal and inconclusive findings on diagnostic imaging of breast: Secondary | ICD-10-CM

## 2016-10-26 ENCOUNTER — Other Ambulatory Visit: Payer: Self-pay | Admitting: Family Medicine

## 2016-10-26 MED ORDER — HYDROCODONE-ACETAMINOPHEN 10-325 MG PO TABS: 1.0000 | ORAL_TABLET | Freq: Three times a day (TID) | ORAL | 0 refills | Status: DC | PRN

## 2016-10-26 NOTE — Telephone Encounter (Signed)
Done

## 2016-10-27 NOTE — Telephone Encounter (Signed)
Script is ready for pick up here at front office, spoke with pt and letter was given.

## 2016-10-28 MED FILL — HYDROCODON-APAP 10-325: 10-325 | 30 days supply | Qty: 90 | Fill #0

## 2016-11-03 ENCOUNTER — Other Ambulatory Visit: Payer: Self-pay

## 2016-11-04 ENCOUNTER — Other Ambulatory Visit: Payer: Self-pay

## 2016-11-04 ENCOUNTER — Ambulatory Visit: Payer: Self-pay

## 2016-11-04 ENCOUNTER — Ambulatory Visit
Admission: RE | Admit: 2016-11-04 | Discharge: 2016-11-04 | Disposition: A | Discharge status: Home / Self Care | Payer: 59 | Source: Ambulatory Visit | Attending: Family Medicine | Admitting: Family Medicine

## 2016-11-04 DIAGNOSIS — R928 Other abnormal and inconclusive findings on diagnostic imaging of breast: Secondary | ICD-10-CM | POA: Diagnosis not present

## 2016-11-05 MED FILL — MOMETASONE FUROATE 50 MCG S: 50 | 30 days supply | Qty: 17 | Fill #6

## 2016-11-05 MED FILL — GABAPENTIN 300 MG CAPSULE: 300 | 30 days supply | Qty: 90 | Fill #3

## 2016-11-05 MED FILL — CELECOXIB 200 MG CAPS: 200 | 30 days supply | Qty: 60 | Fill #4

## 2016-11-11 DIAGNOSIS — H2512 Age-related nuclear cataract, left eye: Secondary | ICD-10-CM | POA: Diagnosis not present

## 2016-11-11 DIAGNOSIS — Z970 Presence of artificial eye: Secondary | ICD-10-CM | POA: Diagnosis not present

## 2016-11-11 DIAGNOSIS — H04122 Dry eye syndrome of left lacrimal gland: Secondary | ICD-10-CM | POA: Diagnosis not present

## 2016-11-20 MED FILL — valACYclovir HCL 1 GM TABS: 1 | 90 days supply | Qty: 90 | Fill #2

## 2016-11-24 ENCOUNTER — Encounter: Payer: Self-pay | Admitting: Family Medicine

## 2016-11-24 ENCOUNTER — Ambulatory Visit (INDEPENDENT_AMBULATORY_CARE_PROVIDER_SITE_OTHER): Payer: 59 | Admitting: Family Medicine

## 2016-11-24 VITALS — BP 110/70 | HR 86 | Temp 97.5°F | Wt 109.0 lb

## 2016-11-24 DIAGNOSIS — F900 Attention-deficit hyperactivity disorder, predominantly inattentive type: Secondary | ICD-10-CM | POA: Insufficient documentation

## 2016-11-24 MED ORDER — HYDROCODONE-ACETAMINOPHEN 10-325 MG PO TABS: 1.0000 | ORAL_TABLET | Freq: Three times a day (TID) | ORAL | 0 refills | Status: DC | PRN

## 2016-11-24 MED ORDER — LISDEXAMFETAMINE DIMESYLATE 20 MG PO CAPS: 20.0000 mg | ORAL_CAPSULE | Freq: Every day | ORAL | 0 refills | Status: DC

## 2016-11-24 NOTE — Progress Notes (Signed)
   Subjective:    Patient ID: [name], female    DOB: 1955-02-02, 61 y.o.   MRN: [id number]  HPI Here to discuss difficulties on her new job position. 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 She is required to process calls very rapidly and she has struggled to focus and keep up with the demands of the job. She has always had trouble with concentration, even in school as a young child. 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 Lamari has tried several of the Vyvanse pills that her son had at home, and she was amazed at how they helped her on the job. She can focus and keep up with everything. She had no side effects.    Review of Systems  Constitutional: Negative.   Respiratory: Negative.   Cardiovascular: Negative.   Musculoskeletal: Positive for back pain and neck pain.  Psychiatric/Behavioral: Positive for decreased concentration. Negative for agitation, confusion and dysphoric mood. The patient is not nervous/anxious.        Objective:   Physical Exam  Constitutional: She is oriented to person, place, and time. She appears well-developed and well-nourished.  Cardiovascular: Normal rate, regular rhythm, normal heart sounds and intact distal pulses.   Pulmonary/Chest: Effort normal and breath sounds normal. No respiratory distress. She has no wheezes. She has no rales.  Neurological: She is alert and oriented to person, place, and time.  Psychiatric: She has a normal mood and affect. Her behavior is normal. Judgment and thought content normal.          Assessment & Plan:  She has ADHD and we will try her on Vyvanse 20 mg to take only on work days. Recheck in one month.  [name], MD

## 2016-11-25 ENCOUNTER — Encounter: Payer: Self-pay | Admitting: *Deleted

## 2016-11-25 ENCOUNTER — Other Ambulatory Visit: Payer: Self-pay | Admitting: *Deleted

## 2016-11-25 NOTE — Patient Outreach (Signed)
[facility]) Care Management  11/25/2016  [name] 22-Sep-1955 [id number]   Subjective: Received the following secure email update from [place], that she received from [facility] Customer Specialist, [phone])  : Authorization is not required for the services.  The reference number for the continuing exception is [id number]. Telephone to to Peg at [facility]  [phone]) and HIPAA verified.   States she does not see a recent past or upcoming appointment in the patient's chart.  Peg advised of the reference number and states she will call patient to set up appointment for cleaning.  Telephone call to patient's home / mobile number, spoke with patient, and HIPAA verified.  Discussed [facility] Care Management [facility] Consult follow up for benefit exception, patient voiced understanding, and is in agreement to follow up.  Patient states she is doing well, she cancelled 11/19/16 appointment with [facility] , because she did not want to have to pay for all the services up front as a requirement of the provider, without an authorization number.  Advised of my conversation with Peg at [facility], given reference number for continued exception, and states she will follow up to reschedule appointment.  Patient states she does not have any education material, transition of care, care coordination, disease management, disease monitoring, transportation, community resource, or pharmacy needs at this time.   States she is very appreciative of the follow up and is in agreement to receive [place] Management information.    Objective: Patient has had benefit exceptions in the past for prosthetic eye cleaning in the past and approval will continue to be ongoing per [facility].    Assessment: Received verbal request from patient on 09/25/15 to start the benefit exception recertification process for  cleaning of right eye prosthetic. Benefit exception recertification request has been sent to [place] Management Assistant Clinical Director Bary Castilla), decision rendered, request completed, reference number called to provider, reference number call to patient, and will proceed with case closure.    Plan:RNCM will send patient successful outreach letter, [facility] pamphlet, and magnet. RNCM will send case closure due to follow up completed / no care management needs request to [name] [place] Management.     Rush Salce H. Annia Friendly, BSN, [place] Management [facility] Telephonic CM Phone: [phone] Fax: [phone]

## 2016-11-26 MED FILL — AMOXICILLIN 500 MG CAPSULE: 500 | 7 days supply | Qty: 21 | Fill #0

## 2016-11-27 MED FILL — hydrOXYzine HCL 25 MG TABS: 25 | 10 days supply | Qty: 60 | Fill #1

## 2016-11-27 MED FILL — HYDROCODON-APAP 10-325: 10-325 | 30 days supply | Qty: 90 | Fill #0

## 2016-11-30 ENCOUNTER — Encounter: Payer: Self-pay | Admitting: Family Medicine

## 2016-12-01 MED ORDER — AMPHETAMINE-DEXTROAMPHET ER 20 MG PO CP24: 20.0000 mg | ORAL_CAPSULE | ORAL | 0 refills | Status: DC

## 2016-12-01 MED FILL — ADDERALL XR 20 MG CAP SA: 20 | 30 days supply | Qty: 30 | Fill #0

## 2016-12-01 NOTE — Telephone Encounter (Signed)
We will switch to Adderall XR 20 mg daily.

## 2016-12-10 MED FILL — GABAPENTIN 300 MG CAPSULE: 300 | 30 days supply | Qty: 90 | Fill #4

## 2016-12-10 MED FILL — CELECOXIB 200 MG CAPS: 200 | 30 days supply | Qty: 60 | Fill #5

## 2016-12-21 ENCOUNTER — Other Ambulatory Visit: Payer: Self-pay | Admitting: Family Medicine

## 2016-12-21 ENCOUNTER — Encounter: Payer: Self-pay | Admitting: Family Medicine

## 2016-12-22 ENCOUNTER — Other Ambulatory Visit: Payer: Self-pay | Admitting: Family Medicine

## 2016-12-22 NOTE — Telephone Encounter (Signed)
Sent to PCP for approval.  

## 2016-12-22 NOTE — Telephone Encounter (Signed)
Sent to PCP for approval. Last OV 11/24/2016. Adderall was last refilled 12/01/2016 for 30 days with no refills. Nocro last refilled 11/24/2016 for 90 days with no refills.

## 2016-12-23 ENCOUNTER — Other Ambulatory Visit: Payer: Self-pay | Admitting: Family Medicine

## 2016-12-23 MED ORDER — HYDROCODONE-ACETAMINOPHEN 10-325 MG PO TABS: 1.0000 | ORAL_TABLET | Freq: Three times a day (TID) | ORAL | 0 refills | Status: DC | PRN

## 2016-12-23 MED ORDER — AMPHETAMINE-DEXTROAMPHET ER 20 MG PO CP24: 20.0000 mg | ORAL_CAPSULE | ORAL | 0 refills | Status: DC

## 2016-12-23 NOTE — Telephone Encounter (Signed)
Done

## 2016-12-23 NOTE — Telephone Encounter (Signed)
Pt is aware that Rx's are placed up front for pick up. Rx's were refilled.

## 2016-12-23 NOTE — Telephone Encounter (Signed)
Pt advised and Rx is ready for pick up. Rx's placed up front.

## 2016-12-29 MED FILL — HYDROCODON-APAP 10-325: 10-325 | 30 days supply | Qty: 90 | Fill #0

## 2016-12-31 MED FILL — ADDERALL XR 20 MG CAP SA: 20 | 30 days supply | Qty: 30 | Fill #0

## 2017-01-14 MED FILL — CELECOXIB 200 MG CAPS: 200 | 30 days supply | Qty: 60 | Fill #6

## 2017-01-14 MED FILL — GABAPENTIN 300 MG CAPSULE: 300 | 30 days supply | Qty: 90 | Fill #5

## 2017-01-20 ENCOUNTER — Other Ambulatory Visit: Payer: Self-pay | Admitting: Family Medicine

## 2017-01-20 ENCOUNTER — Encounter: Payer: Self-pay | Admitting: Family Medicine

## 2017-01-21 NOTE — Telephone Encounter (Signed)
Last Ov 11/24/2016. Rx was last refilled 12/23/2016 for 90 days with no refills. Called pt and left her a VM that Dr. Sarajane Jews is out of the office and will NOT be back until Monday. Wanted to ask the pt if she is OK with waiting until then or would she like for me to see if another provider could refill?

## 2017-01-22 NOTE — Telephone Encounter (Signed)
Pt stated that she is fine with waiting until Monday for Dr. Sarajane Jews to send in Rx's electronically. Sent to PCP

## 2017-01-22 NOTE — Telephone Encounter (Signed)
Sent to PCP for approval.  

## 2017-01-25 MED ORDER — HYDROCODONE-ACETAMINOPHEN 10-325 MG PO TABS: 1.0000 | ORAL_TABLET | Freq: Three times a day (TID) | ORAL | 0 refills | Status: DC | PRN

## 2017-01-25 NOTE — Telephone Encounter (Signed)
Done but she needs a PMV for any more

## 2017-01-27 ENCOUNTER — Ambulatory Visit: Payer: Self-pay | Admitting: Family Medicine

## 2017-01-27 MED FILL — HYDROCODON-APAP 10-325: 10-325 | 30 days supply | Qty: 90 | Fill #0

## 2017-02-01 ENCOUNTER — Ambulatory Visit (INDEPENDENT_AMBULATORY_CARE_PROVIDER_SITE_OTHER): Payer: 59 | Admitting: Family Medicine

## 2017-02-01 ENCOUNTER — Encounter: Payer: Self-pay | Admitting: Family Medicine

## 2017-02-01 VITALS — BP 112/60 | HR 49 | Wt 105.6 lb

## 2017-02-01 DIAGNOSIS — F1190 Opioid use, unspecified, uncomplicated: Secondary | ICD-10-CM | POA: Diagnosis not present

## 2017-02-01 DIAGNOSIS — M2550 Pain in unspecified joint: Secondary | ICD-10-CM

## 2017-02-01 MED ORDER — HYDROCODONE-ACETAMINOPHEN 10-325 MG PO TABS: 1.0000 | ORAL_TABLET | Freq: Four times a day (QID) | ORAL | 0 refills | Status: DC | PRN

## 2017-02-01 MED FILL — ADDERALL XR 20 MG CAP SA: 20 | 30 days supply | Qty: 30 | Fill #0

## 2017-02-01 NOTE — Progress Notes (Signed)
   Subjective:    Patient ID: [name], female    DOB: 22-Sep-1955, 62 y.o.   MRN: [id number]  HPI Here for pain management. She has been struggling with pain in multiple joints, including the neck and lower back. She uses a standing desk at work and this helps to keep her from getting so stiff. She still has significant pain and has trouble sleeping at night.  Indication for chronic opioid: pain in multiple joints Medication and dose: Norco 10-325  # pills per month: 90 Last UDS date: 02-01-17 Pain contract signed (Y/N): 02-01-17 Date narcotic database last reviewed (include red flags): 02-01-17     Review of Systems  Constitutional: Negative.   Respiratory: Negative.   Cardiovascular: Negative.   Musculoskeletal: Positive for arthralgias, back pain and neck pain.  Neurological: Negative.        Objective:   Physical Exam  Constitutional: She is oriented to person, place, and time. She appears well-developed and well-nourished.  Cardiovascular: Normal rate, regular rhythm, normal heart sounds and intact distal pulses.  Pulmonary/Chest: Effort normal and breath sounds normal. No respiratory distress. She has no wheezes. She has no rales.  Neurological: She is alert and oriented to person, place, and time.          Assessment & Plan:  Polyarthralgia. We will increase the Norco 10-325 to use every 6 hours prn.  [name], MD

## 2017-02-02 ENCOUNTER — Other Ambulatory Visit: Payer: Self-pay | Admitting: Family Medicine

## 2017-02-04 LAB — PAIN MGMT, PROFILE 8 W/CONF, U
6 Acetylmorphine: NEGATIVE ng/mL (ref <10)
Alcohol Metabolites: POSITIVE ng/mL — AB (ref <500)
Amphetamine: 1214 ng/mL — ABNORMAL HIGH (ref <250)
Amphetamines: POSITIVE ng/mL — AB (ref <500)
Benzodiazepines: NEGATIVE ng/mL (ref <100)
Buprenorphine, Urine: NEGATIVE ng/mL (ref <5)
Cocaine Metabolite: NEGATIVE ng/mL (ref <150)
Codeine: NEGATIVE ng/mL (ref <50)
Creatinine: 13.1 mg/dL — ABNORMAL LOW
Ethyl Glucuronide (ETG): 826 ng/mL — ABNORMAL HIGH (ref <500)
Ethyl Sulfate (ETS): 148 ng/mL — ABNORMAL HIGH (ref <100)
Hydrocodone: 735 ng/mL — ABNORMAL HIGH (ref <50)
Hydromorphone: 200 ng/mL — ABNORMAL HIGH (ref <50)
MDMA: NEGATIVE ng/mL (ref <500)
Marijuana Metabolite: NEGATIVE ng/mL (ref <20)
Methamphetamine: NEGATIVE ng/mL (ref <250)
Morphine: NEGATIVE ng/mL (ref <50)
Norhydrocodone: 442 ng/mL — ABNORMAL HIGH (ref <50)
Opiates: POSITIVE ng/mL — AB (ref <100)
Oxidant: NEGATIVE ug/mL (ref <200)
Oxycodone: NEGATIVE ng/mL (ref <100)
Specific Gravity: 1.004 (ref >=1.0)
pH: 6.56 (ref 4.5–9.0)

## 2017-02-24 ENCOUNTER — Other Ambulatory Visit: Payer: Self-pay | Admitting: Family Medicine

## 2017-02-24 MED FILL — valACYclovir HCL 1 GM TABS: 1 | 90 days supply | Qty: 90 | Fill #3

## 2017-02-24 MED FILL — GABAPENTIN 300 MG CAPSULE: 300 | 30 days supply | Qty: 90 | Fill #6

## 2017-02-24 MED FILL — CELECOXIB 200 MG CAPSULE: 200 | 30 days supply | Qty: 60 | Fill #0

## 2017-02-24 MED FILL — MOMETASONE FUROATE 50 MCG S: 50 | 30 days supply | Qty: 17 | Fill #0

## 2017-02-24 NOTE — Telephone Encounter (Signed)
Last OV 02/01/2017  Nasonex last refilled last refilled 02/14/2016 17 g with 11   Celebrex last refilled 06/30/2016 disp 60 with 6 refills

## 2017-02-26 ENCOUNTER — Other Ambulatory Visit: Payer: Self-pay | Admitting: Family Medicine

## 2017-02-26 NOTE — Telephone Encounter (Signed)
Pt shouldn't need refills on the [facility] to PCP for approval for refill for adderall

## 2017-02-28 MED FILL — HYDROCODON-APAP 10-325: 10-325 | 30 days supply | Qty: 120 | Fill #0

## 2017-03-01 NOTE — Telephone Encounter (Signed)
She should have refills for Adderall until 03-31-17

## 2017-03-02 NOTE — Telephone Encounter (Signed)
Called pt she stated that she is fine no refills needed.

## 2017-03-03 MED FILL — ADDERALL XR 20 MG CAP SA: 20 | 30 days supply | Qty: 30 | Fill #0

## 2017-03-11 ENCOUNTER — Encounter: Payer: Self-pay | Admitting: Family Medicine

## 2017-03-11 ENCOUNTER — Ambulatory Visit (INDEPENDENT_AMBULATORY_CARE_PROVIDER_SITE_OTHER): Payer: 59 | Admitting: Family Medicine

## 2017-03-11 VITALS — BP 146/72 | HR 69 | Temp 97.2°F | Ht 61.0 in | Wt 102.4 lb

## 2017-03-11 DIAGNOSIS — M810 Age-related osteoporosis without current pathological fracture: Secondary | ICD-10-CM | POA: Diagnosis not present

## 2017-03-11 DIAGNOSIS — Z0000 Encounter for general adult medical examination without abnormal findings: Secondary | ICD-10-CM

## 2017-03-11 LAB — HEPATIC FUNCTION PANEL
ALT: 11 U/L (ref 0–35)
AST: 21 U/L (ref 0–37)
Albumin: 5 g/dL (ref 3.5–5.2)
Alkaline Phosphatase: 42 U/L (ref 39–117)
Bilirubin, Direct: 0.2 mg/dL (ref 0.0–0.3)
Total Bilirubin: 1.1 mg/dL (ref 0.2–1.2)
Total Protein: 8.1 g/dL (ref 6.0–8.3)

## 2017-03-11 LAB — CBC WITH DIFFERENTIAL/PLATELET
Basophils Absolute: 0 10*3/uL (ref 0.0–0.1)
Basophils Relative: 0.4 % (ref 0.0–3.0)
Eosinophils Absolute: 0 10*3/uL (ref 0.0–0.7)
Eosinophils Relative: 0.8 % (ref 0.0–5.0)
HCT: 42.7 % (ref 36.0–46.0)
Hemoglobin: 14.6 g/dL (ref 12.0–15.0)
Lymphocytes Relative: 45.1 % (ref 12.0–46.0)
Lymphs Abs: 2.3 10*3/uL (ref 0.7–4.0)
MCHC: 34.3 g/dL (ref 30.0–36.0)
MCV: 97.9 fl (ref 78.0–100.0)
Monocytes Absolute: 0.4 10*3/uL (ref 0.1–1.0)
Monocytes Relative: 8.4 % (ref 3.0–12.0)
Neutro Abs: 2.3 10*3/uL (ref 1.4–7.7)
Neutrophils Relative %: 45.3 % (ref 43.0–77.0)
Platelets: 171 10*3/uL (ref 150.0–400.0)
RBC: 4.36 Mil/uL (ref 3.87–5.11)
RDW: 12.7 % (ref 11.5–15.5)
WBC: 5.1 10*3/uL (ref 4.0–10.5)

## 2017-03-11 LAB — BASIC METABOLIC PANEL
BUN: 13 mg/dL (ref 6–23)
CO2: 32 mEq/L (ref 19–32)
Calcium: 10.1 mg/dL (ref 8.4–10.5)
Chloride: 102 mEq/L (ref 96–112)
Creatinine, Ser: 0.82 mg/dL (ref 0.40–1.20)
GFR: 75.76 mL/min (ref >60.00)
Glucose, Bld: 105 mg/dL — ABNORMAL HIGH (ref 70–99)
Potassium: 5.1 mEq/L (ref 3.5–5.1)
Sodium: 141 mEq/L (ref 135–145)

## 2017-03-11 LAB — LIPID PANEL
Cholesterol: 197 mg/dL (ref 0–200)
HDL: 56.6 mg/dL (ref >39.00)
LDL Cholesterol: 119 mg/dL — ABNORMAL HIGH (ref 0–99)
NonHDL: 140.67
Total CHOL/HDL Ratio: 3
Triglycerides: 107 mg/dL (ref 0.0–149.0)
VLDL: 21.4 mg/dL (ref 0.0–40.0)

## 2017-03-11 LAB — POC URINALSYSI DIPSTICK (AUTOMATED)
Bilirubin, UA: NEGATIVE
Blood, UA: NEGATIVE
Glucose, UA: NEGATIVE
Ketones, UA: NEGATIVE
Leukocytes, UA: NEGATIVE
Nitrite, UA: NEGATIVE
Protein, UA: NEGATIVE
Spec Grav, UA: 1.01 (ref 1.010–1.025)
Urobilinogen, UA: 0.2 E.U./dL
pH, UA: 7 (ref 5.0–8.0)

## 2017-03-11 LAB — TSH: TSH: 1.05 u[IU]/mL (ref 0.35–4.50)

## 2017-03-11 NOTE — Progress Notes (Signed)
   Subjective:    Patient ID: [name], female    DOB: [birth year], 62 y.o.   MRN: [id number]  HPI Here for a well exam. She feels great.    Review of Systems  Constitutional: Negative.   HENT: Negative.   Eyes: Negative.   Respiratory: Negative.   Cardiovascular: Negative.   Gastrointestinal: Negative.   Genitourinary: Negative for decreased urine volume, difficulty urinating, dyspareunia, dysuria, enuresis, flank pain, frequency, hematuria, pelvic pain and urgency.  Musculoskeletal: Negative.   Skin: Negative.   Neurological: Negative.   Psychiatric/Behavioral: Negative.        Objective:   Physical Exam  Constitutional: She is oriented to person, place, and time. She appears well-developed and well-nourished. No distress.  HENT:  Head: Normocephalic and atraumatic.  Right Ear: External ear normal.  Left Ear: External ear normal.  Nose: Nose normal.  Mouth/Throat: Oropharynx is clear and moist. No oropharyngeal exudate.  Eyes: No scleral icterus.  Left eye is clear   Neck: Normal range of motion. Neck supple. No JVD present. No thyromegaly present.  Cardiovascular: Normal rate, regular rhythm, normal heart sounds and intact distal pulses. Exam reveals no gallop and no friction rub.  No murmur heard. Pulmonary/Chest: Effort normal and breath sounds normal. No respiratory distress. She has no wheezes. She has no rales. She exhibits no tenderness.  Abdominal: Soft. Bowel sounds are normal. She exhibits no distension and no mass. There is no tenderness. There is no rebound and no guarding.  Musculoskeletal: Normal range of motion. She exhibits no edema or tenderness.  Lymphadenopathy:    She has no cervical adenopathy.  Neurological: She is alert and oriented to person, place, and time. She has normal reflexes. No cranial nerve deficit. She exhibits normal muscle tone. Coordination normal.  Skin: Skin is warm and dry. No rash noted. No erythema.  Psychiatric: She has a  normal mood and affect. Her behavior is normal. Judgment and thought content normal.          Assessment & Plan:  Well exam. We discussed diet and exercise. Get fasting labs. Set up a DEXA for this fall.  Beverly Penna, MD

## 2017-03-28 ENCOUNTER — Other Ambulatory Visit: Payer: Self-pay | Admitting: Family Medicine

## 2017-03-29 ENCOUNTER — Other Ambulatory Visit: Payer: Self-pay | Admitting: Family Medicine

## 2017-03-29 MED ORDER — AMPHETAMINE-DEXTROAMPHET ER 20 MG PO CP24: 20.0000 mg | ORAL_CAPSULE | ORAL | 0 refills | Status: DC

## 2017-03-29 MED FILL — HYDROCODON-APAP 10-325: 10-325 | 30 days supply | Qty: 120 | Fill #0

## 2017-03-29 MED FILL — GABAPENTIN 300 MG CAPS: 300 | 30 days supply | Qty: 90 | Fill #0

## 2017-03-29 MED FILL — CELECOXIB 200 MG CAPSULE: 200 | 30 days supply | Qty: 60 | Fill #1

## 2017-03-29 NOTE — Telephone Encounter (Signed)
Done for 3 months

## 2017-03-29 NOTE — Telephone Encounter (Signed)
Called and spoke with pt she need to have her adderall refilled will schedule for her pain management OV soon.   Adderall was last refilled 12/23/2016 disp 30 with 3 refills   Last OV 03/11/2017

## 2017-04-01 MED FILL — ADDERALL XR 20 MG CAP SA: 20 | 30 days supply | Qty: 30 | Fill #0

## 2017-04-07 DIAGNOSIS — Z970 Presence of artificial eye: Secondary | ICD-10-CM | POA: Diagnosis not present

## 2017-04-07 DIAGNOSIS — H04122 Dry eye syndrome of left lacrimal gland: Secondary | ICD-10-CM | POA: Diagnosis not present

## 2017-04-07 DIAGNOSIS — H2512 Age-related nuclear cataract, left eye: Secondary | ICD-10-CM | POA: Diagnosis not present

## 2017-04-27 ENCOUNTER — Ambulatory Visit: Payer: 59 | Admitting: Family Medicine

## 2017-04-27 ENCOUNTER — Encounter: Payer: Self-pay | Admitting: Family Medicine

## 2017-04-27 VITALS — BP 120/78 | HR 65 | Temp 97.6°F | Ht 61.0 in | Wt 105.0 lb

## 2017-04-27 DIAGNOSIS — M2550 Pain in unspecified joint: Secondary | ICD-10-CM | POA: Diagnosis not present

## 2017-04-27 DIAGNOSIS — F1190 Opioid use, unspecified, uncomplicated: Secondary | ICD-10-CM

## 2017-04-27 MED ORDER — HYDROCODONE-ACETAMINOPHEN 10-325 MG PO TABS: 1.0000 | ORAL_TABLET | Freq: Four times a day (QID) | ORAL | 0 refills | Status: DC | PRN

## 2017-04-27 MED FILL — HYDROCODON-APAP 10-325: 10-325 | 30 days supply | Qty: 120 | Fill #0

## 2017-04-27 NOTE — Progress Notes (Signed)
   Subjective:    Patient ID: Scherrie Gerlach, female    DOB: Jun 25, 1955, 62 y.o.   MRN: [id number]  HPI Here for pain management. She is doing well.  Indication for chronic opioid: osteoarthritis  Medication and dose: Norco 10-325 # pills per month: 120 Last UDS date: 02-01-17 Opioid Treatment Agreement signed (Y/N): 04-27-17 Opioid Treatment Agreement last reviewed with patient:  04-27-17 [place] reviewed this encounter (include red flags):  04-27-17    Review of Systems  Respiratory: Negative.   Cardiovascular: Negative.   Musculoskeletal: Positive for arthralgias.  Neurological: Negative.        Objective:   Physical Exam  Constitutional: She is oriented to person, place, and time. She appears well-developed and well-nourished.  Neck: No thyromegaly present.  Cardiovascular: Normal rate, regular rhythm, normal heart sounds and intact distal pulses.  Pulmonary/Chest: Effort normal and breath sounds normal. No respiratory distress. She has no wheezes. She has no rales.  Lymphadenopathy:    She has no cervical adenopathy.  Neurological: She is alert and oriented to person, place, and time.          Assessment & Plan:  Pain management. Meds were refilled.  [name], MD

## 2017-04-29 MED FILL — CELECOXIB 200 MG CAPSULE: 200 | 30 days supply | Qty: 60 | Fill #2

## 2017-04-29 MED FILL — GABAPENTIN 300 MG CAPSULE: 300 | 30 days supply | Qty: 90 | Fill #1

## 2017-05-03 MED FILL — ADDERALL XR 20 MG CAP SA: 20 | 30 days supply | Qty: 30 | Fill #0

## 2017-05-11 ENCOUNTER — Other Ambulatory Visit: Payer: Self-pay

## 2017-05-11 ENCOUNTER — Encounter: Payer: Self-pay | Admitting: Family Medicine

## 2017-05-11 MED ORDER — DOXYCYCLINE HYCLATE 100 MG PO TABS: 100.0000 mg | ORAL_TABLET | Freq: Two times a day (BID) | ORAL | 0 refills | Status: DC

## 2017-05-11 MED FILL — DOXYCYCLINE HYCLATE 100 MG: 100 | 10 days supply | Qty: 20 | Fill #0

## 2017-05-11 NOTE — Telephone Encounter (Signed)
I agree with taking an antibiotic. Call in Doxycycline 100 mg bid for 10 days.

## 2017-05-11 NOTE — Telephone Encounter (Signed)
Doxycycline 100 mg bid for 10 days

## 2017-05-25 MED FILL — CELECOXIB 200 MG CAPSULE: 200 | 30 days supply | Qty: 60 | Fill #3

## 2017-05-25 MED FILL — valACYclovir HCL 1 GM TABS: 1 | 90 days supply | Qty: 90 | Fill #0

## 2017-05-26 MED FILL — GABAPENTIN 300 MG CAPSULE: 300 | 30 days supply | Qty: 90 | Fill #2

## 2017-05-27 MED FILL — HYDROCODON-APAP 10-325: 10-325 | 30 days supply | Qty: 120 | Fill #0

## 2017-05-31 MED FILL — ADDERALL XR 20 MG CAP SA: 20 | 30 days supply | Qty: 30 | Fill #0

## 2017-06-29 MED FILL — CELECOXIB 200 MG CAPSULE: 200 | 30 days supply | Qty: 60 | Fill #4

## 2017-06-29 MED FILL — GABAPENTIN 300 MG CAPSULE: 300 | 30 days supply | Qty: 90 | Fill #3

## 2017-06-29 MED FILL — HYDROCODON-APAP 10-325: 10-325 | 30 days supply | Qty: 120 | Fill #0

## 2017-07-01 ENCOUNTER — Other Ambulatory Visit: Payer: Self-pay | Admitting: Family Medicine

## 2017-07-01 MED ORDER — AMPHETAMINE-DEXTROAMPHET ER 20 MG PO CP24: 20.0000 mg | ORAL_CAPSULE | Freq: Every morning | ORAL | 0 refills | Status: DC

## 2017-07-01 MED FILL — ADDERALL XR 20 MG CAP SA: 20 | 30 days supply | Qty: 30 | Fill #0

## 2017-07-01 NOTE — Telephone Encounter (Signed)
Last filled 03/29/17, #30 x no rf - note attached to Rx to not fill before 05/31/17 Last OV 04/27/17  Please advise Dr Sarajane Jews. Thanks.

## 2017-07-01 NOTE — Telephone Encounter (Signed)
Done

## 2017-07-02 NOTE — Telephone Encounter (Signed)
Last OV 04/27/2017   Last refilled 08/31/2017 disp 30 with no refills   Pt should have refills call pt's pharmacy

## 2017-07-20 ENCOUNTER — Encounter: Payer: Self-pay | Admitting: Family Medicine

## 2017-07-20 ENCOUNTER — Ambulatory Visit: Payer: 59 | Admitting: Family Medicine

## 2017-07-20 VITALS — BP 120/70 | HR 93 | Temp 98.4°F | Ht 61.0 in | Wt 102.0 lb

## 2017-07-20 DIAGNOSIS — Z23 Encounter for immunization: Secondary | ICD-10-CM | POA: Diagnosis not present

## 2017-07-20 DIAGNOSIS — M2550 Pain in unspecified joint: Secondary | ICD-10-CM

## 2017-07-20 DIAGNOSIS — F1190 Opioid use, unspecified, uncomplicated: Secondary | ICD-10-CM

## 2017-07-20 MED ORDER — HYDROCODONE-ACETAMINOPHEN 10-325 MG PO TABS: 1.0000 | ORAL_TABLET | Freq: Four times a day (QID) | ORAL | 0 refills | Status: DC | PRN

## 2017-07-20 NOTE — Progress Notes (Signed)
   Subjective:    Patient ID: [name], female    DOB: April 21, 1955, 62 y.o.   MRN: [id number]  HPI Here for pain management. She is doing well. She still tries to go walking or running every day.  Indication for chronic opioid: polyarthralgias  Medication and dose: Norco 10-325 # pills per month: 120 Last UDS date: 02-01-17 Opioid Treatment Agreement signed (Y/N): 04-27-17 Opioid Treatment Agreement last reviewed with patient:  07-20-17 [place] reviewed this encounter (include red flags):  07-20-17    Review of Systems  Constitutional: Negative.   Respiratory: Negative.   Cardiovascular: Negative.   Musculoskeletal: Positive for arthralgias.  Neurological: Negative.        Objective:   Physical Exam  Constitutional: She is oriented to person, place, and time. She appears well-developed and well-nourished.  Cardiovascular: Normal rate, regular rhythm, normal heart sounds and intact distal pulses.  Pulmonary/Chest: Effort normal and breath sounds normal.  Neurological: She is alert and oriented to person, place, and time.          Assessment & Plan:  Pain management, meds are refilled.  [name], MD

## 2017-07-21 DIAGNOSIS — Z23 Encounter for immunization: Secondary | ICD-10-CM | POA: Diagnosis not present

## 2017-07-21 DIAGNOSIS — M2550 Pain in unspecified joint: Secondary | ICD-10-CM | POA: Diagnosis not present

## 2017-07-21 DIAGNOSIS — F1190 Opioid use, unspecified, uncomplicated: Secondary | ICD-10-CM | POA: Diagnosis not present

## 2017-07-21 NOTE — Addendum Note (Signed)
Addended by: Myriam Forehand on: 07/21/2017 10:46 AM   Modules accepted: Orders

## 2017-07-26 ENCOUNTER — Ambulatory Visit: Payer: Self-pay | Admitting: Family Medicine

## 2017-07-27 MED FILL — CELECOXIB 200 MG CAPSULE: 200 | 30 days supply | Qty: 60 | Fill #5

## 2017-07-27 MED FILL — HYDROCODON-APAP 10-325: 10-325 | 30 days supply | Qty: 120 | Fill #0

## 2017-07-27 MED FILL — GABAPENTIN 300 MG CAPSULE: 300 | 30 days supply | Qty: 90 | Fill #4

## 2017-07-28 ENCOUNTER — Telehealth: Payer: Self-pay

## 2017-07-28 MED FILL — ADDERALL XR 20 MG CAP SA: 20 | 30 days supply | Qty: 30 | Fill #0

## 2017-07-28 NOTE — Telephone Encounter (Signed)
Pt calling and is at the pharmacy to pick up Rx for Adderall.  Needs this filled asap  Aware that Dr Sarajane Jews is in clinic but that we will try and get it approved as soon as we can. 3 day early fill -- not due until 07/31/17

## 2017-07-28 NOTE — Telephone Encounter (Signed)
Pharmacy calling and checking status

## 2017-07-28 NOTE — Telephone Encounter (Signed)
I called [name] and informed Gaspar Bidding of the message below per Dr Sarajane Jews.

## 2017-07-28 NOTE — Telephone Encounter (Signed)
Pt called to report she may be leaving town in the next day or so as her son has a new baby on the way. She would like authorization for an early fill on her Adderall. Pt due to fill on 07/31/17.   Dr. Sarajane Jews - Please advise. Thanks!

## 2017-07-28 NOTE — Telephone Encounter (Signed)
Please okay this early refill

## 2017-08-24 DIAGNOSIS — M75122 Complete rotator cuff tear or rupture of left shoulder, not specified as traumatic: Secondary | ICD-10-CM | POA: Diagnosis not present

## 2017-08-24 DIAGNOSIS — M75121 Complete rotator cuff tear or rupture of right shoulder, not specified as traumatic: Secondary | ICD-10-CM | POA: Diagnosis not present

## 2017-08-26 MED FILL — valACYclovir HCL 1 GM TABS: 1 | 90 days supply | Qty: 90 | Fill #1

## 2017-08-26 MED FILL — CELECOXIB 200 MG CAP: 200 | 30 days supply | Qty: 60 | Fill #6

## 2017-08-26 MED FILL — GABAPENTIN 300 MG CAPSULE: 300 | 30 days supply | Qty: 90 | Fill #5

## 2017-08-27 MED FILL — HYDROCODON-APAP 10-325: 10-325 | 30 days supply | Qty: 120 | Fill #0

## 2017-08-31 ENCOUNTER — Encounter: Payer: Self-pay | Admitting: Family Medicine

## 2017-08-31 ENCOUNTER — Other Ambulatory Visit: Payer: Self-pay | Admitting: Family Medicine

## 2017-08-31 MED FILL — ADDERALL XR 20 MG CAP SA: 20 | 30 days supply | Qty: 30 | Fill #0

## 2017-08-31 NOTE — Telephone Encounter (Signed)
Last fill 08/31/17 Last OV 07/20/17  Ok to fill?

## 2017-09-02 MED ORDER — AMPHETAMINE-DEXTROAMPHET ER 20 MG PO CP24: 20.0000 mg | ORAL_CAPSULE | Freq: Every morning | ORAL | 0 refills | Status: DC

## 2017-09-02 NOTE — Telephone Encounter (Signed)
Done

## 2017-09-14 ENCOUNTER — Other Ambulatory Visit: Payer: Self-pay | Admitting: Family Medicine

## 2017-09-14 DIAGNOSIS — Z1231 Encounter for screening mammogram for malignant neoplasm of breast: Secondary | ICD-10-CM

## 2017-09-16 ENCOUNTER — Telehealth: Payer: Self-pay | Admitting: Family Medicine

## 2017-09-16 NOTE — Telephone Encounter (Signed)
error 

## 2017-09-28 ENCOUNTER — Encounter: Payer: Self-pay | Admitting: Family Medicine

## 2017-09-28 ENCOUNTER — Ambulatory Visit: Payer: 59 | Admitting: Family Medicine

## 2017-09-28 VITALS — BP 138/76 | HR 97 | Temp 97.9°F | Ht 61.0 in | Wt 103.2 lb

## 2017-09-28 DIAGNOSIS — Z23 Encounter for immunization: Secondary | ICD-10-CM | POA: Diagnosis not present

## 2017-09-28 DIAGNOSIS — F1190 Opioid use, unspecified, uncomplicated: Secondary | ICD-10-CM

## 2017-09-28 DIAGNOSIS — M2550 Pain in unspecified joint: Secondary | ICD-10-CM | POA: Diagnosis not present

## 2017-09-28 MED ORDER — HYDROCODONE-ACETAMINOPHEN 10-325 MG PO TABS: 1.0000 | ORAL_TABLET | Freq: Four times a day (QID) | ORAL | 0 refills | Status: DC | PRN

## 2017-09-28 MED ORDER — HYDROCODONE-ACETAMINOPHEN 10-325 MG PO TABS: 1.0000 | ORAL_TABLET | Freq: Four times a day (QID) | ORAL | 0 refills | Status: AC | PRN

## 2017-09-28 MED FILL — HYDROCODON-APAP 10-325: 10-325 | 30 days supply | Qty: 120 | Fill #0

## 2017-09-28 NOTE — Progress Notes (Signed)
   Subjective:    Patient ID: Scherrie Gerlach, female    DOB: Sep 15, 1955, 62 y.o.   MRN: [id number]  HPI Here for a pain management visit. She is doing well. She had steroid shots into both shoulders per Dr. Berenice Primas recently. Indication for chronic opioid: polyarthralgia  Medication and dose: Norco 10-325  # pills per month: 120 Last UDS date: 02-01-17 Opioid Treatment Agreement signed (Y/N): 04-27-17 Opioid Treatment Agreement last reviewed with patient:  09-28-17 [place] reviewed this encounter (include red flags):  09-28-17    Review of Systems  Constitutional: Negative.   Respiratory: Negative.   Cardiovascular: Negative.   Musculoskeletal: Positive for arthralgias.  Neurological: Negative.        Objective:   Physical Exam  Constitutional: She is oriented to person, place, and time. She appears well-developed and well-nourished.  Cardiovascular: Normal rate, regular rhythm, normal heart sounds and intact distal pulses.  Pulmonary/Chest: Effort normal and breath sounds normal.  Neurological: She is alert and oriented to person, place, and time.          Assessment & Plan:  Pain management. Meds were refilled.  [name], MD

## 2017-10-01 MED FILL — ADDERALL XR 20 MG CAP SA: 20 | 30 days supply | Qty: 30 | Fill #0

## 2017-10-06 DIAGNOSIS — H2512 Age-related nuclear cataract, left eye: Secondary | ICD-10-CM | POA: Diagnosis not present

## 2017-10-06 DIAGNOSIS — H04122 Dry eye syndrome of left lacrimal gland: Secondary | ICD-10-CM | POA: Diagnosis not present

## 2017-10-06 DIAGNOSIS — Z970 Presence of artificial eye: Secondary | ICD-10-CM | POA: Diagnosis not present

## 2017-10-13 ENCOUNTER — Other Ambulatory Visit: Payer: Self-pay | Admitting: Family Medicine

## 2017-10-13 MED FILL — GABAPENTIN 300 MG CAPSULE: 300 | 30 days supply | Qty: 90 | Fill #6

## 2017-10-13 NOTE — Telephone Encounter (Signed)
Dr. Sarajane Jews please advise of the refills.  Thanks

## 2017-10-15 MED FILL — CELECOXIB 200 MG CAPSULE: 200 | 30 days supply | Qty: 60 | Fill #0

## 2017-10-15 NOTE — Telephone Encounter (Signed)
Pt checking status of refill.

## 2017-10-28 MED FILL — HYDROCODON-APAP 10-325: 10-325 | 30 days supply | Qty: 120 | Fill #0

## 2017-11-01 MED FILL — ADDERALL XR 20 MG CAP SA: 20 | 30 days supply | Qty: 30 | Fill #0

## 2017-11-10 ENCOUNTER — Ambulatory Visit
Admission: RE | Admit: 2017-11-10 | Discharge: 2017-11-10 | Disposition: A | Discharge status: Home / Self Care | Payer: 59 | Source: Ambulatory Visit | Attending: Family Medicine | Admitting: Family Medicine

## 2017-11-10 DIAGNOSIS — Z1231 Encounter for screening mammogram for malignant neoplasm of breast: Secondary | ICD-10-CM | POA: Diagnosis not present

## 2017-11-10 DIAGNOSIS — M810 Age-related osteoporosis without current pathological fracture: Secondary | ICD-10-CM | POA: Diagnosis not present

## 2017-11-10 DIAGNOSIS — M85851 Other specified disorders of bone density and structure, right thigh: Secondary | ICD-10-CM | POA: Diagnosis not present

## 2017-11-16 ENCOUNTER — Encounter: Payer: Self-pay | Admitting: *Deleted

## 2017-11-19 ENCOUNTER — Other Ambulatory Visit: Payer: Self-pay | Admitting: Family Medicine

## 2017-11-19 MED FILL — valACYclovir HCL 1 GM TABS: 1 | 90 days supply | Qty: 90 | Fill #2

## 2017-11-19 MED FILL — CELECOXIB 200 MG CAP: 200 | 30 days supply | Qty: 60 | Fill #1

## 2017-11-26 ENCOUNTER — Encounter: Payer: Self-pay | Admitting: Family Medicine

## 2017-11-26 MED ORDER — GABAPENTIN 300 MG PO CAPS: 300.0000 mg | ORAL_CAPSULE | Freq: Three times a day (TID) | ORAL | 6 refills | Status: DC

## 2017-11-26 MED FILL — HYDROCODON-APAP 10-325: 10-325 | 30 days supply | Qty: 120 | Fill #0

## 2017-11-26 MED FILL — GABAPENTIN 300 MG CAPSULE: 300 | 30 days supply | Qty: 90 | Fill #0

## 2017-12-01 MED FILL — ADDERALL XR 20 MG CAP SA: 20 | 30 days supply | Qty: 30 | Fill #0

## 2017-12-02 DIAGNOSIS — L814 Other melanin hyperpigmentation: Secondary | ICD-10-CM | POA: Diagnosis not present

## 2017-12-02 DIAGNOSIS — L821 Other seborrheic keratosis: Secondary | ICD-10-CM | POA: Diagnosis not present

## 2017-12-02 DIAGNOSIS — D225 Melanocytic nevi of trunk: Secondary | ICD-10-CM | POA: Diagnosis not present

## 2017-12-02 DIAGNOSIS — L570 Actinic keratosis: Secondary | ICD-10-CM | POA: Diagnosis not present

## 2017-12-02 DIAGNOSIS — L718 Other rosacea: Secondary | ICD-10-CM | POA: Diagnosis not present

## 2017-12-02 MED FILL — metroNIDAZOLE 0.75 % CREA: 0.75 | 30 days supply | Qty: 45 | Fill #0

## 2017-12-07 ENCOUNTER — Ambulatory Visit: Payer: 59 | Admitting: Family Medicine

## 2017-12-07 ENCOUNTER — Encounter: Payer: Self-pay | Admitting: Family Medicine

## 2017-12-07 VITALS — BP 118/62 | HR 82 | Temp 97.6°F | Wt 106.1 lb

## 2017-12-07 DIAGNOSIS — R1011 Right upper quadrant pain: Secondary | ICD-10-CM | POA: Diagnosis not present

## 2017-12-07 DIAGNOSIS — K8020 Calculus of gallbladder without cholecystitis without obstruction: Secondary | ICD-10-CM

## 2017-12-07 LAB — POC URINALSYSI DIPSTICK (AUTOMATED)
Bilirubin, UA: NEGATIVE
Blood, UA: NEGATIVE
Glucose, UA: NEGATIVE
Ketones, UA: NEGATIVE
Leukocytes, UA: NEGATIVE
Nitrite, UA: NEGATIVE
Protein, UA: NEGATIVE
Spec Grav, UA: 1.01 (ref 1.010–1.025)
Urobilinogen, UA: 0.2 E.U./dL
pH, UA: 6 (ref 5.0–8.0)

## 2017-12-07 MED ORDER — ONDANSETRON HCL 8 MG PO TABS: 8.0000 mg | ORAL_TABLET | Freq: Four times a day (QID) | ORAL | 1 refills | Status: DC | PRN

## 2017-12-07 MED FILL — ONDANSETRON HCL 8 MG TABLET: 8 | 7 days supply | Qty: 30 | Fill #0

## 2017-12-07 NOTE — Progress Notes (Signed)
   Subjective:    Patient ID: Scherrie Gerlach, female    DOB: 1955/08/03, 62 y.o.   MRN: [id number]  HPI Here for one week of intermittent RUQ pain which can radiate through to the back, along with waves of nausea. She has vomited several times. No black or coffee ground emesis. No fevers. She rarely has heartburn. Her BMs have gone from one firm stool a day to 4 soft stools a day. The color remains a light brown.    Review of Systems  Constitutional: Negative.   Respiratory: Negative.   Cardiovascular: Negative.   Gastrointestinal: Positive for abdominal pain, nausea and vomiting. Negative for abdominal distention, anal bleeding, blood in stool, constipation and diarrhea.  Genitourinary: Negative.        Objective:   Physical Exam  Constitutional: She appears well-developed and well-nourished.  Eyes: No scleral icterus.  Neck: No thyromegaly present.  Cardiovascular: Normal rate, regular rhythm, normal heart sounds and intact distal pulses.  Pulmonary/Chest: Effort normal and breath sounds normal.  Abdominal: Soft. Bowel sounds are normal. She exhibits no distension and no mass. There is no rebound and no guarding. No hernia.  She is quite tender in the RUQ  Lymphadenopathy:    She has no cervical adenopathy.          Assessment & Plan:  RUQ pain, likely of gall bladder origin. Get labs today and set up an abdominal US soon. Avoid fatty foods. Use Zofran for nausea. [name], MD

## 2017-12-08 LAB — CBC WITH DIFFERENTIAL/PLATELET
Basophils Absolute: 0 10*3/uL (ref 0.0–0.1)
Basophils Relative: 0.4 % (ref 0.0–3.0)
Eosinophils Absolute: 0.1 10*3/uL (ref 0.0–0.7)
Eosinophils Relative: 2 % (ref 0.0–5.0)
HCT: 40.6 % (ref 36.0–46.0)
Hemoglobin: 14 g/dL (ref 12.0–15.0)
Lymphocytes Relative: 33.8 % (ref 12.0–46.0)
Lymphs Abs: 1.5 10*3/uL (ref 0.7–4.0)
MCHC: 34.4 g/dL (ref 30.0–36.0)
MCV: 99.3 fl (ref 78.0–100.0)
Monocytes Absolute: 0.5 10*3/uL (ref 0.1–1.0)
Monocytes Relative: 10.6 % (ref 3.0–12.0)
Neutro Abs: 2.4 10*3/uL (ref 1.4–7.7)
Neutrophils Relative %: 53.2 % (ref 43.0–77.0)
Platelets: 167 10*3/uL (ref 150.0–400.0)
RBC: 4.09 Mil/uL (ref 3.87–5.11)
RDW: 12.5 % (ref 11.5–15.5)
WBC: 4.6 10*3/uL (ref 4.0–10.5)

## 2017-12-08 LAB — BASIC METABOLIC PANEL
BUN: 17 mg/dL (ref 6–23)
CO2: 32 mEq/L (ref 19–32)
Calcium: 9.5 mg/dL (ref 8.4–10.5)
Chloride: 100 mEq/L (ref 96–112)
Creatinine, Ser: 0.83 mg/dL (ref 0.40–1.20)
GFR: 74.51 mL/min (ref >60.00)
Glucose, Bld: 98 mg/dL (ref 70–99)
Potassium: 4.1 mEq/L (ref 3.5–5.1)
Sodium: 139 mEq/L (ref 135–145)

## 2017-12-08 LAB — HEPATIC FUNCTION PANEL
ALT: 12 U/L (ref 0–35)
AST: 23 U/L (ref 0–37)
Albumin: 4.7 g/dL (ref 3.5–5.2)
Alkaline Phosphatase: 45 U/L (ref 39–117)
Bilirubin, Direct: 0.1 mg/dL (ref 0.0–0.3)
Total Bilirubin: 0.5 mg/dL (ref 0.2–1.2)
Total Protein: 7.5 g/dL (ref 6.0–8.3)

## 2017-12-08 LAB — AMYLASE: Amylase: 70 U/L (ref 27–131)

## 2017-12-08 LAB — LIPASE: Lipase: 29 U/L (ref 11.0–59.0)

## 2017-12-15 ENCOUNTER — Ambulatory Visit
Admission: RE | Admit: 2017-12-15 | Discharge: 2017-12-15 | Disposition: A | Discharge status: Home / Self Care | Payer: 59 | Source: Ambulatory Visit | Attending: Family Medicine | Admitting: Family Medicine

## 2017-12-15 ENCOUNTER — Encounter: Payer: Self-pay | Admitting: *Deleted

## 2017-12-15 DIAGNOSIS — B182 Chronic viral hepatitis C: Secondary | ICD-10-CM | POA: Diagnosis not present

## 2017-12-15 DIAGNOSIS — R1011 Right upper quadrant pain: Secondary | ICD-10-CM

## 2017-12-16 NOTE — Telephone Encounter (Signed)
Dr. Fry please advise. Thanks  

## 2017-12-17 NOTE — Telephone Encounter (Signed)
Yes she should see Surgery. I did the referral

## 2017-12-17 NOTE — Addendum Note (Signed)
Addended by: [name] A on: 12/17/2017 01:11 PM   Modules accepted: Orders

## 2017-12-20 MED FILL — CELECOXIB 200 MG CAP: 200 | 30 days supply | Qty: 60 | Fill #2

## 2017-12-22 MED FILL — ONDANSETRON HCL 8 MG TABLET: 8 | 7 days supply | Qty: 30 | Fill #1

## 2017-12-27 MED FILL — GABAPENTIN 300 MG CAPSULE: 300 | 30 days supply | Qty: 90 | Fill #1

## 2017-12-27 MED FILL — HYDROCODON-APAP 10-325: 10-325 | 30 days supply | Qty: 120 | Fill #0

## 2017-12-30 ENCOUNTER — Other Ambulatory Visit: Payer: Self-pay | Admitting: General Surgery

## 2017-12-30 DIAGNOSIS — K8020 Calculus of gallbladder without cholecystitis without obstruction: Secondary | ICD-10-CM | POA: Diagnosis not present

## 2017-12-30 DIAGNOSIS — Z9049 Acquired absence of other specified parts of digestive tract: Secondary | ICD-10-CM | POA: Diagnosis not present

## 2017-12-30 DIAGNOSIS — B182 Chronic viral hepatitis C: Secondary | ICD-10-CM | POA: Diagnosis not present

## 2017-12-31 ENCOUNTER — Encounter: Payer: Self-pay | Admitting: Family Medicine

## 2017-12-31 ENCOUNTER — Other Ambulatory Visit: Payer: Self-pay | Admitting: Family Medicine

## 2017-12-31 NOTE — H&P (Signed)
[name] Location: [facility] Patient #: [id number] DOB: 1956/01/20 Single / Language: [name] / Race: White Female       History of Present Illness  The patient is a 62 year old female who presents for evaluation of gall stones. This is a very pleasant healthy 61 year old female, referred by Dr. Alysia Dunn for symptomatic gallstones.     She is generally healthy. She says she has had decreased appetite for about 1 year and is had unintentional 10 pound weight loss. For about 1 months she's been having almost daily episodes of epigastric and right upper quadrant pain and nausea. She's had 1 episode of vomiting. Her stools are light brown in color but no diarrhea. She's having almost daily symptoms. On December 07, 2017 she saw Dr. Sarajane Dunn. CBC, complete metabolic panel, lipase were normal. On November 20 she had an ultrasound which shows gallstones but no inflammatory changes. CBD 7 mm. Liver pancreas and kidneys look normal. No other abnormalities.     Comorbidities include open appendectomy and rhinoplasty and cervical spine surgery. Chronic hepatitis C treated for cure. Last colonoscopy 2017 Family history reveals mother living and well. Father deceased of lung cancer from smoking Social history reveals that she is divorced for 40 years. Lives with her mother who is healthy. She has 1 child. She runs 3 miles a day. 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 Denies alcohol or tobacco     I think that her symptoms are due to her gallbladder. Very typical. Documented stones. Doubt other visceral disease. Because of almost daily symptoms I advised urgent cholecystectomy and she wants to do that. She will be scheduled for laparoscopic cholecystectomy with cholangiogram, possible open cholecystectomy. I discussed the indications, details, techniques, and risk of the surgery in detail. She is aware the risk of bleeding, infection,  conversion to open laparotomy, bile leak, port site hernia, injury to adjacent organs with major reconstructive surgery. Diarrhea and pancreatitis. She understands all these issues. All of her questions are answered. She agrees with this plan. I gave her patient information booklet and reviewed this with her   Past Surgical History  Appendectomy  Colon Polyp Removal - Colonoscopy  Oral Surgery  Tonsillectomy   Diagnostic Studies History  Colonoscopy  1-5 years ago Mammogram  within last year Pap Smear  1-5 years ago  Allergies No Known Drug Allergies  Allergies Reconciled   Medication History  Adderall XR (20MG  Capsule ER 24HR, Oral) Active. HYDROcodone-Acetaminophen (10-325MG  Tablet, Oral) Active. Celecoxib (200MG  Capsule, Oral) Active. Gabapentin (300MG  Capsule, Oral) Active. Ondansetron HCl (8MG  Tablet, Oral) Active. valACYclovir HCl (1GM Tablet, Oral) Active. metroNIDAZOLE (0.75% Cream, External) Active. Vitamin B12 (Oral) Specific strength unknown - Active. Vitamin D (1000UNIT Tablet, Oral) Active. Calcium (75MG  Tablet, Oral) Active. Artificial Tears (0.4% Solution, Ophthalmic) Active. Medications Reconciled  Social History Alcohol use  Occasional alcohol use. Caffeine use  Coffee. No drug use  Tobacco use  Never smoker.  Family History  Arthritis  Mother. Hypertension  Mother. Respiratory Condition  Father.  Pregnancy / Birth History Age at menarche  87 years. Age of menopause  <47 Gravida  1 Irregular periods  Maternal age  45-35 Para  1  Other Problems  Arthritis  Back Pain  Cholelithiasis  Hemorrhoids     Review of Systems  General Present- Appetite Loss and Weight Loss. Not Present- Chills, Fatigue, Fever, Night Sweats and Weight Gain. Skin Not Present- Change in Wart/Mole, Dryness, Hives, Jaundice, New  Lesions, Non-Healing Wounds, Rash and Ulcer. HEENT Present- Wears glasses/contact lenses. Not Present-  Earache, Hearing Loss, Hoarseness, Nose Bleed, Oral Ulcers, Ringing in the Ears, Seasonal Allergies, Sinus Pain, Sore Throat, Visual Disturbances and Yellow Eyes. Respiratory Not Present- Bloody sputum, Chronic Cough, Difficulty Breathing, Snoring and Wheezing. Breast Not Present- Breast Mass, Breast Pain, Nipple Discharge and Skin Changes. Cardiovascular Not Present- Chest Pain, Difficulty Breathing Lying Down, Leg Cramps, Palpitations, Rapid Heart Rate, Shortness of Breath and Swelling of Extremities. Gastrointestinal Present- Abdominal Pain and Nausea. Not Present- Bloating, Bloody Stool, Change in Bowel Habits, Chronic diarrhea, Constipation, Difficulty Swallowing, Excessive gas, Gets full quickly at meals, Hemorrhoids, Indigestion, Rectal Pain and Vomiting. Female Genitourinary Not Present- Frequency, Nocturia, Painful Urination, Pelvic Pain and Urgency. Musculoskeletal Present- Back Pain. Not Present- Joint Pain, Joint Stiffness, Muscle Pain, Muscle Weakness and Swelling of Extremities. Neurological Not Present- Decreased Memory, Fainting, Headaches, Numbness, Seizures, Tingling, Tremor, Trouble walking and Weakness. Psychiatric Not Present- Anxiety, Bipolar, Change in Sleep Pattern, Depression, Fearful and Frequent crying. Endocrine Not Present- Cold Intolerance, Excessive Hunger, Hair Changes, Heat Intolerance, Hot flashes and New Diabetes. Hematology Not Present- Blood Thinners, Easy Bruising, Excessive bleeding, Gland problems, HIV and Persistent Infections.  Vitals Weight: 105.2 lb Height: 61.5in Body Surface Area: 1.45 m Body Mass Index: 19.56 kg/m  Temp.: 97.34F(Temporal)  Pulse: 72 (Regular)  P.OX: 99% (Room air) BP: 162/90 (Sitting, Left Arm, Standard)     Physical Exam  General Mental Status-Alert. General Appearance-Consistent with stated age. Hydration-Well hydrated. Voice-Normal.  Head and Neck Head-normocephalic, atraumatic with no lesions or  palpable masses. Trachea-midline. Thyroid Gland Characteristics - normal size and consistency.  Eye Eyeball - Bilateral-Extraocular movements intact. Sclera/Conjunctiva - Bilateral-No scleral icterus.  Chest and Lung Exam Chest and lung exam reveals -quiet, even and easy respiratory effort with no use of accessory muscles and on auscultation, normal breath sounds, no adventitious sounds and normal vocal resonance. Inspection Chest Wall - Normal. Back - normal.  Cardiovascular Cardiovascular examination reveals -normal heart sounds, regular rate and rhythm with no murmurs and normal pedal pulses bilaterally.  Abdomen Inspection Inspection of the abdomen reveals - No Hernias. Skin - Scar - Note: Right lower quadrant scar well-healed. Very low essentially at the inguinal area from previous appendectomy. Palpation/Percussion Palpation and Percussion of the abdomen reveal - Soft, Non Tender, No Rebound tenderness, No Rigidity (guarding) and No hepatosplenomegaly. Auscultation Auscultation of the abdomen reveals - Bowel sounds normal. Note: Subjectively a little tender in the right upper quadrant but no guarding or mass. Really a benign exam objectively.   Neurologic Neurologic evaluation reveals -alert and oriented x 3 with no impairment of recent or remote memory. Mental Status-Normal.  Musculoskeletal Normal Exam - Left-Upper Extremity Strength Normal and Lower Extremity Strength Normal. Normal Exam - Right-Upper Extremity Strength Normal and Lower Extremity Strength Normal.  Lymphatic Head & Neck  General Head & Neck Lymphatics: Bilateral - Description - Normal. Axillary  General Axillary Region: Bilateral - Description - Normal. Tenderness - Non Tender. Femoral & Inguinal  Generalized Femoral & Inguinal Lymphatics: Bilateral - Description - Normal. Tenderness - Non Tender.    Assessment & Plan  GALLSTONES (K80.20)   For the past month he had been  having almost daily nausea and right upper quadrant and epigastric pain. Fortunately you have had only 1 episode of vomiting You have gallstones but no other abnormality is seen on ultrasound Your lab work is fortunately normal  It is highly likely that your symptoms are due to  your gallbladder, and I recommended proceeding with gallbladder surgery at this time Follow a very low-fat diet Take Tylenol for pain if she needed you will be scheduled for laparoscopic cholecystectomy and possible cholangiogram as soon as possible We have discussed the indications, techniques, and risk of the surgery in detail  . CHRONIC HEPATITIS C WITHOUT HEPATIC COMA (B18.2) Impression: Treated for cure HISTORY OF APPENDECTOMY (Z90.49)    Edsel Petrin. Dalbert Batman, M.D., [facility] General and Minimally invasive Surgery Breast and Colorectal Surgery Office:   [phone] Pager:   [phone]

## 2018-01-03 ENCOUNTER — Other Ambulatory Visit: Payer: Self-pay | Admitting: Family Medicine

## 2018-01-03 NOTE — Pre-Procedure Instructions (Signed)
[name]  01/03/2018      [place], [place] - [place] [address] Phone: [phone] Fax: [phone]    Your procedure is scheduled on Friday December 13th.  Report to [facility] Admitting at 12:30 PM.  Call this number if you have problems the morning of surgery:  [phone]   Remember:  Do not eat or drink after midnight.  You may drink clear liquids until 11:30am. Clear liquids allowed are: Water, Juice (non-citric and without pulp), Carbonated beverages, Clear Tea, Black Coffee only and Gatorade    Take these medicines the morning of surgery with A SIP OF WATER  gabapentin (NEURONTIN) 300 MG  HYDROcodone-acetaminophen (NORCO)- if needed mometasone (NASONEX)-if needed ondansetron (ZOFRAN)-if needed Carboxymethylcellul-Glycerin (REFRESH OPTIVE PF OP) if needed    7 days prior to surgery STOP taking any Aspirin(unless otherwise instructed by your surgeon), Celebrex, Aleve, Naproxen, Ibuprofen, Motrin, Advil, Goody's, BC's, all herbal medications, fish oil, and all vitamins   Do not wear jewelry, make-up or nail polish.  Do not wear lotions, powders, or perfumes, or deodorant.  Do not shave 48 hours prior to surgery.  Men may shave face and neck.  Do not bring valuables to the hospital.  [facility] is not responsible for any belongings or valuables.  Contacts, dentures or bridgework may not be worn into surgery.  Leave your suitcase in the car.  After surgery it may be brought to your room.  For patients admitted to the hospital, discharge time will be determined by your treatment team.  Patients discharged the day of surgery will not be allowed to drive home.   [place]- Preparing For Surgery  Before surgery, you can play an important role. Because skin is not sterile, your skin needs to be as free of germs as possible. You can reduce the number of germs on your skin  by washing with CHG (chlorahexidine gluconate) Soap before surgery.  CHG is an antiseptic cleaner which kills germs and bonds with the skin to continue killing germs even after washing.    Oral Hygiene is also important to reduce your risk of infection.  Remember - BRUSH YOUR TEETH THE MORNING OF SURGERY WITH YOUR REGULAR TOOTHPASTE  Please do not use if you have an allergy to CHG or antibacterial soaps. If your skin becomes reddened/irritated stop using the CHG.  Do not shave (including legs and underarms) for at least 48 hours prior to first CHG shower. It is OK to shave your face.  Please follow these instructions carefully.   1. Shower the NIGHT BEFORE SURGERY and the MORNING OF SURGERY with CHG.   2. If you chose to wash your hair, wash your hair first as usual with your normal shampoo.  3. After you shampoo, rinse your hair and body thoroughly to remove the shampoo.  4. Use CHG as you would any other liquid soap. You can apply CHG directly to the skin and wash gently with a scrungie or a clean washcloth.   5. Apply the CHG Soap to your body ONLY FROM THE NECK DOWN.  Do not use on open wounds or open sores. Avoid contact with your eyes, ears, mouth and genitals (private parts). Wash Face and genitals (private parts)  with your normal soap.  6. Wash thoroughly, paying special attention to the area where your surgery will be performed.  7. Thoroughly rinse your body with warm water  from the neck down.  8. DO NOT shower/wash with your normal soap after using and rinsing off the CHG Soap.  9. Pat yourself dry with a CLEAN TOWEL.  10. Wear CLEAN PAJAMAS to bed the night before surgery, wear comfortable clothes the morning of surgery  11. Place CLEAN SHEETS on your bed the night of your first shower and DO NOT SLEEP WITH PETS.    Day of Surgery: Shower as stated above. Do not apply any deodorants/lotions.  Please wear clean clothes to the hospital/surgery center.   Remember to  brush your teeth WITH YOUR REGULAR TOOTHPASTE.

## 2018-01-04 ENCOUNTER — Encounter (HOSPITAL_COMMUNITY): Payer: Self-pay

## 2018-01-04 ENCOUNTER — Encounter (HOSPITAL_COMMUNITY)
Admission: RE | Admit: 2018-01-04 | Discharge: 2018-01-04 | Disposition: A | Discharge status: Home / Self Care | Payer: 59 | Source: Ambulatory Visit | Attending: General Surgery | Admitting: General Surgery

## 2018-01-04 ENCOUNTER — Other Ambulatory Visit: Payer: Self-pay | Admitting: Family Medicine

## 2018-01-04 ENCOUNTER — Other Ambulatory Visit: Payer: Self-pay

## 2018-01-04 DIAGNOSIS — Z9049 Acquired absence of other specified parts of digestive tract: Secondary | ICD-10-CM | POA: Diagnosis not present

## 2018-01-04 DIAGNOSIS — Z01812 Encounter for preprocedural laboratory examination: Secondary | ICD-10-CM | POA: Insufficient documentation

## 2018-01-04 DIAGNOSIS — K8010 Calculus of gallbladder with chronic cholecystitis without obstruction: Secondary | ICD-10-CM | POA: Diagnosis not present

## 2018-01-04 DIAGNOSIS — K8020 Calculus of gallbladder without cholecystitis without obstruction: Secondary | ICD-10-CM | POA: Diagnosis present

## 2018-01-04 DIAGNOSIS — Z79899 Other long term (current) drug therapy: Secondary | ICD-10-CM | POA: Diagnosis not present

## 2018-01-04 DIAGNOSIS — Z79891 Long term (current) use of opiate analgesic: Secondary | ICD-10-CM | POA: Diagnosis not present

## 2018-01-04 DIAGNOSIS — Z791 Long term (current) use of non-steroidal anti-inflammatories (NSAID): Secondary | ICD-10-CM | POA: Diagnosis not present

## 2018-01-04 DIAGNOSIS — F909 Attention-deficit hyperactivity disorder, unspecified type: Secondary | ICD-10-CM | POA: Diagnosis not present

## 2018-01-04 HISTORY — DX: Pneumonia, unspecified organism: J18.9

## 2018-01-04 LAB — COMPREHENSIVE METABOLIC PANEL
ALT: 17 U/L (ref 0–44)
AST: 27 U/L (ref 15–41)
Albumin: 4.4 g/dL (ref 3.5–5.0)
Alkaline Phosphatase: 37 U/L — ABNORMAL LOW (ref 38–126)
Anion gap: 12 (ref 5–15)
BUN: 11 mg/dL (ref 6–20)
CO2: 24 mmol/L (ref 22–32)
Calcium: 9.1 mg/dL (ref 8.9–10.3)
Chloride: 104 mmol/L (ref 98–111)
Creatinine, Ser: 0.87 mg/dL (ref 0.44–1.00)
GFR calc Af Amer: >60 mL/min (ref >60)
GFR calc non Af Amer: >60 mL/min (ref >60)
Glucose, Bld: 96 mg/dL (ref 70–99)
Potassium: 4.4 mmol/L (ref 3.5–5.1)
Sodium: 140 mmol/L (ref 135–145)
Total Bilirubin: 1 mg/dL (ref 0.3–1.2)
Total Protein: 7.6 g/dL (ref 6.5–8.1)

## 2018-01-04 LAB — CBC WITH DIFFERENTIAL/PLATELET
Abs Immature Granulocytes: 0.01 10*3/uL (ref 0.00–0.07)
Basophils Absolute: 0 10*3/uL (ref 0.0–0.1)
Basophils Relative: 0 %
Eosinophils Absolute: 0.1 10*3/uL (ref 0.0–0.5)
Eosinophils Relative: 2 %
HCT: 41.2 % (ref 36.0–46.0)
Hemoglobin: 13.4 g/dL (ref 12.0–15.0)
Immature Granulocytes: 0 %
Lymphocytes Relative: 43 %
Lymphs Abs: 2 10*3/uL (ref 0.7–4.0)
MCH: 32.5 pg (ref 26.0–34.0)
MCHC: 32.5 g/dL (ref 30.0–36.0)
MCV: 100 fL (ref 80.0–100.0)
Monocytes Absolute: 0.5 10*3/uL (ref 0.1–1.0)
Monocytes Relative: 11 %
Neutro Abs: 2.1 10*3/uL (ref 1.7–7.7)
Neutrophils Relative %: 44 %
Platelets: 145 10*3/uL — ABNORMAL LOW (ref 150–400)
RBC: 4.12 MIL/uL (ref 3.87–5.11)
RDW: 11.9 % (ref 11.5–15.5)
WBC: 4.7 10*3/uL (ref 4.0–10.5)
nRBC: 0 % (ref 0.0–0.2)

## 2018-01-04 NOTE — Telephone Encounter (Signed)
Last fill 11/02/17 Last OV 12/07/17-acute visit  Ok to fill?

## 2018-01-04 NOTE — Progress Notes (Signed)
PCP - Dr. [name] Cardiologist - denies  Chest x-ray - N/A EKG - N/A Stress Test - denies ECHO - denies Cardiac Cath - denies  Sleep Study - denies  Aspirin Instructions: patient instructed to hold all ASA/NSAID's/herbal medications/vitamins and fish oil as of today. Anesthesia review: no  Patient denies shortness of breath, fever, cough and chest pain at PAT appointment   Patient verbalized understanding of instructions that were given to them at the PAT appointment. Patient was also instructed that they will need to review over the PAT instructions again at home before surgery.

## 2018-01-05 ENCOUNTER — Ambulatory Visit: Payer: 59 | Admitting: Family Medicine

## 2018-01-05 ENCOUNTER — Encounter: Payer: Self-pay | Admitting: Family Medicine

## 2018-01-05 VITALS — BP 124/82 | HR 64 | Temp 97.8°F | Wt 106.0 lb

## 2018-01-05 DIAGNOSIS — F1190 Opioid use, unspecified, uncomplicated: Secondary | ICD-10-CM

## 2018-01-05 DIAGNOSIS — M2550 Pain in unspecified joint: Secondary | ICD-10-CM | POA: Diagnosis not present

## 2018-01-05 MED ORDER — AMPHETAMINE-DEXTROAMPHET ER 20 MG PO CP24: 20.0000 mg | ORAL_CAPSULE | Freq: Every morning | ORAL | 0 refills | Status: DC

## 2018-01-05 MED ORDER — HYDROCODONE-ACETAMINOPHEN 10-325 MG PO TABS: 1.0000 | ORAL_TABLET | Freq: Four times a day (QID) | ORAL | 0 refills | Status: AC | PRN

## 2018-01-05 MED ORDER — HYDROCODONE-ACETAMINOPHEN 10-325 MG PO TABS: 1.0000 | ORAL_TABLET | Freq: Four times a day (QID) | ORAL | 0 refills | Status: DC | PRN

## 2018-01-05 MED FILL — ADDERALL XR 20 MG CAP SA: 20 | 30 days supply | Qty: 30 | Fill #0

## 2018-01-05 NOTE — Progress Notes (Signed)
   Subjective:    Patient ID: [name], female    DOB: [birth year], 62 y.o.   MRN: [id number]  HPI Here for pain management. She is doing well as far as joint pains go. She is scheduled for a cholecystectomy later this week. Indication for chronic opioid: polyarthralgia Medication and dose: Norco 10-325 # pills per month: 120 Last UDS date: 02-01-17 Opioid Treatment Agreement signed (Y/N): 04-27-17 Opioid Treatment Agreement last reviewed with patient:  01-05-18 [facility] reviewed this encounter (include red flags):  01-05-18    Review of Systems  Constitutional: Negative.   Respiratory: Negative.   Cardiovascular: Negative.   Musculoskeletal: Positive for arthralgias.  Neurological: Negative.        Objective:   Physical Exam  Constitutional: She is oriented to person, place, and time. She appears well-developed and well-nourished.  Cardiovascular: Normal rate, regular rhythm, normal heart sounds and intact distal pulses.  Pulmonary/Chest: Effort normal and breath sounds normal.  Neurological: She is alert and oriented to person, place, and time.          Assessment & Plan:  Pain management, meds were refilled.  [name], MD

## 2018-01-05 NOTE — Telephone Encounter (Signed)
FYI for Dr. Fry  

## 2018-01-07 ENCOUNTER — Encounter (HOSPITAL_COMMUNITY)
Admission: RE | Disposition: A | Discharge status: Home / Self Care | Payer: Self-pay | Source: Home / Self Care | Attending: General Surgery

## 2018-01-07 ENCOUNTER — Ambulatory Visit (HOSPITAL_COMMUNITY): Payer: 59 | Admitting: Anesthesiology

## 2018-01-07 ENCOUNTER — Ambulatory Visit (HOSPITAL_COMMUNITY)
Admission: RE | Admit: 2018-01-07 | Discharge: 2018-01-07 | Disposition: A | Discharge status: Home / Self Care | Payer: 59 | Attending: General Surgery | Admitting: General Surgery

## 2018-01-07 ENCOUNTER — Ambulatory Visit (HOSPITAL_COMMUNITY): Payer: 59

## 2018-01-07 ENCOUNTER — Encounter (HOSPITAL_COMMUNITY): Payer: Self-pay

## 2018-01-07 ENCOUNTER — Other Ambulatory Visit: Payer: Self-pay

## 2018-01-07 DIAGNOSIS — Z9049 Acquired absence of other specified parts of digestive tract: Secondary | ICD-10-CM | POA: Insufficient documentation

## 2018-01-07 DIAGNOSIS — Z79891 Long term (current) use of opiate analgesic: Secondary | ICD-10-CM | POA: Insufficient documentation

## 2018-01-07 DIAGNOSIS — F909 Attention-deficit hyperactivity disorder, unspecified type: Secondary | ICD-10-CM | POA: Insufficient documentation

## 2018-01-07 DIAGNOSIS — K915 Postcholecystectomy syndrome: Secondary | ICD-10-CM | POA: Diagnosis not present

## 2018-01-07 DIAGNOSIS — Z79899 Other long term (current) drug therapy: Secondary | ICD-10-CM | POA: Diagnosis not present

## 2018-01-07 DIAGNOSIS — Z791 Long term (current) use of non-steroidal anti-inflammatories (NSAID): Secondary | ICD-10-CM | POA: Insufficient documentation

## 2018-01-07 DIAGNOSIS — K8020 Calculus of gallbladder without cholecystitis without obstruction: Secondary | ICD-10-CM | POA: Diagnosis present

## 2018-01-07 DIAGNOSIS — J189 Pneumonia, unspecified organism: Secondary | ICD-10-CM | POA: Diagnosis not present

## 2018-01-07 DIAGNOSIS — D649 Anemia, unspecified: Secondary | ICD-10-CM | POA: Diagnosis not present

## 2018-01-07 DIAGNOSIS — K8010 Calculus of gallbladder with chronic cholecystitis without obstruction: Secondary | ICD-10-CM | POA: Diagnosis not present

## 2018-01-07 DIAGNOSIS — G709 Myoneural disorder, unspecified: Secondary | ICD-10-CM | POA: Diagnosis not present

## 2018-01-07 DIAGNOSIS — Z419 Encounter for procedure for purposes other than remedying health state, unspecified: Secondary | ICD-10-CM

## 2018-01-07 HISTORY — PX: CHOLECYSTECTOMY: SHX55

## 2018-01-07 HISTORY — DX: Calculus of gallbladder without cholecystitis without obstruction: K80.20

## 2018-01-07 LAB — SURGICAL PCR SCREEN
MRSA, PCR: NEGATIVE
Staphylococcus aureus: NEGATIVE

## 2018-01-07 SURGERY — LAPAROSCOPIC CHOLECYSTECTOMY WITH INTRAOPERATIVE CHOLANGIOGRAM
Anesthesia: General | Site: Abdomen

## 2018-01-07 MED ORDER — DEXAMETHASONE SODIUM PHOSPHATE 10 MG/ML IJ SOLN
INTRAMUSCULAR | Status: AC
  Filled 2018-01-07: qty 1

## 2018-01-07 MED ORDER — GABAPENTIN 300 MG PO CAPS
ORAL_CAPSULE | ORAL | Status: AC
  Filled 2018-01-07: qty 1

## 2018-01-07 MED ORDER — ACETAMINOPHEN 650 MG RE SUPP: 650.0000 mg | RECTAL | Status: DC | PRN

## 2018-01-07 MED ORDER — FENTANYL CITRATE (PF) 250 MCG/5ML IJ SOLN
INTRAMUSCULAR | Status: AC
  Filled 2018-01-07: qty 5

## 2018-01-07 MED ORDER — ONDANSETRON HCL 4 MG/2ML IJ SOLN
INTRAMUSCULAR | Status: DC | PRN
  Administered 2018-01-07: 4 mg via INTRAVENOUS

## 2018-01-07 MED ORDER — FENTANYL CITRATE (PF) 100 MCG/2ML IJ SOLN
INTRAMUSCULAR | Status: AC
  Administered 2018-01-07: 25 ug via INTRAVENOUS
  Filled 2018-01-07: qty 2

## 2018-01-07 MED ORDER — CHLORHEXIDINE GLUCONATE CLOTH 2 % EX PADS: 6.0000 | MEDICATED_PAD | Freq: Once | CUTANEOUS | Status: DC

## 2018-01-07 MED ORDER — CELECOXIB 200 MG PO CAPS
ORAL_CAPSULE | ORAL | Status: AC
  Filled 2018-01-07: qty 1

## 2018-01-07 MED ORDER — EPHEDRINE 5 MG/ML INJ
INTRAVENOUS | Status: AC
  Filled 2018-01-07: qty 10

## 2018-01-07 MED ORDER — METOCLOPRAMIDE HCL 5 MG/ML IJ SOLN
INTRAMUSCULAR | Status: AC
  Administered 2018-01-07: 10 mg via INTRAVENOUS
  Filled 2018-01-07: qty 2

## 2018-01-07 MED ORDER — ONDANSETRON HCL 4 MG/2ML IJ SOLN
INTRAMUSCULAR | Status: AC
  Filled 2018-01-07: qty 2

## 2018-01-07 MED ORDER — ACETAMINOPHEN 500 MG PO TABS: 1000.0000 mg | ORAL_TABLET | Freq: Four times a day (QID) | ORAL | Status: DC

## 2018-01-07 MED ORDER — FENTANYL CITRATE (PF) 100 MCG/2ML IJ SOLN: 25.0000 ug | INTRAMUSCULAR | Status: DC | PRN

## 2018-01-07 MED ORDER — CELECOXIB 200 MG PO CAPS: 200.0000 mg | ORAL_CAPSULE | ORAL | Status: AC

## 2018-01-07 MED ORDER — MIDAZOLAM HCL 2 MG/2ML IJ SOLN
INTRAMUSCULAR | Status: AC
  Filled 2018-01-07: qty 2

## 2018-01-07 MED ORDER — 0.9 % SODIUM CHLORIDE (POUR BTL) OPTIME
TOPICAL | Status: DC | PRN
  Administered 2018-01-07: 1000 mL

## 2018-01-07 MED ORDER — LIDOCAINE 2% (20 MG/ML) 5 ML SYRINGE
INTRAMUSCULAR | Status: AC
  Filled 2018-01-07: qty 5

## 2018-01-07 MED ORDER — ACETAMINOPHEN 500 MG PO TABS: 1000.0000 mg | ORAL_TABLET | ORAL | Status: AC

## 2018-01-07 MED ORDER — PROPOFOL 10 MG/ML IV BOLUS
INTRAVENOUS | Status: DC | PRN
  Administered 2018-01-07: 150 mg via INTRAVENOUS

## 2018-01-07 MED ORDER — PHENYLEPHRINE 40 MCG/ML (10ML) SYRINGE FOR IV PUSH (FOR BLOOD PRESSURE SUPPORT)
PREFILLED_SYRINGE | INTRAVENOUS | Status: DC | PRN
  Administered 2018-01-07 (×2): 40 ug via INTRAVENOUS

## 2018-01-07 MED ORDER — ROCURONIUM BROMIDE 10 MG/ML (PF) SYRINGE
PREFILLED_SYRINGE | INTRAVENOUS | Status: DC | PRN
  Administered 2018-01-07: 10 mg via INTRAVENOUS
  Administered 2018-01-07: 50 mg via INTRAVENOUS

## 2018-01-07 MED ORDER — ACETAMINOPHEN 325 MG PO TABS: 650.0000 mg | ORAL_TABLET | ORAL | Status: DC | PRN

## 2018-01-07 MED ORDER — SODIUM CHLORIDE 0.9% FLUSH: 3.0000 mL | INTRAVENOUS | Status: DC | PRN

## 2018-01-07 MED ORDER — MEPERIDINE HCL 50 MG/ML IJ SOLN: 6.2500 mg | INTRAMUSCULAR | Status: DC | PRN

## 2018-01-07 MED ORDER — TRAMADOL HCL 50 MG PO TABS: 50.0000 mg | ORAL_TABLET | Freq: Four times a day (QID) | ORAL | 0 refills | Status: DC | PRN

## 2018-01-07 MED ORDER — ROCURONIUM BROMIDE 50 MG/5ML IV SOSY
PREFILLED_SYRINGE | INTRAVENOUS | Status: AC
  Filled 2018-01-07: qty 10

## 2018-01-07 MED ORDER — ACETAMINOPHEN 500 MG PO TABS
ORAL_TABLET | ORAL | Status: AC
  Filled 2018-01-07: qty 2

## 2018-01-07 MED ORDER — PHENYLEPHRINE 40 MCG/ML (10ML) SYRINGE FOR IV PUSH (FOR BLOOD PRESSURE SUPPORT)
PREFILLED_SYRINGE | INTRAVENOUS | Status: AC
  Filled 2018-01-07: qty 10

## 2018-01-07 MED ORDER — FENTANYL CITRATE (PF) 250 MCG/5ML IJ SOLN
INTRAMUSCULAR | Status: DC | PRN
  Administered 2018-01-07: 100 ug via INTRAVENOUS
  Administered 2018-01-07: 25 ug via INTRAVENOUS
  Administered 2018-01-07: 50 ug via INTRAVENOUS

## 2018-01-07 MED ORDER — MIDAZOLAM HCL 5 MG/5ML IJ SOLN
INTRAMUSCULAR | Status: DC | PRN
  Administered 2018-01-07: 2 mg via INTRAVENOUS

## 2018-01-07 MED ORDER — GABAPENTIN 300 MG PO CAPS: 300.0000 mg | ORAL_CAPSULE | ORAL | Status: AC

## 2018-01-07 MED ORDER — FENTANYL CITRATE (PF) 100 MCG/2ML IJ SOLN
25.0000 ug | INTRAMUSCULAR | Status: DC | PRN
  Administered 2018-01-07: 25 ug via INTRAVENOUS

## 2018-01-07 MED ORDER — SODIUM CHLORIDE 0.9 % IR SOLN: Status: DC | PRN

## 2018-01-07 MED ORDER — SODIUM CHLORIDE 0.9% FLUSH: 3.0000 mL | Freq: Two times a day (BID) | INTRAVENOUS | Status: DC

## 2018-01-07 MED ORDER — SODIUM CHLORIDE 0.9 % IV SOLN
INTRAVENOUS | Status: DC | PRN
  Administered 2018-01-07: 20 ug/min via INTRAVENOUS

## 2018-01-07 MED ORDER — SUCCINYLCHOLINE CHLORIDE 200 MG/10ML IV SOSY
PREFILLED_SYRINGE | INTRAVENOUS | Status: AC
  Filled 2018-01-07: qty 10

## 2018-01-07 MED ORDER — IOPAMIDOL (ISOVUE-300) INJECTION 61%
INTRAVENOUS | Status: AC
  Filled 2018-01-07: qty 50

## 2018-01-07 MED ORDER — SODIUM CHLORIDE 0.9 % IV SOLN
INTRAVENOUS | Status: DC | PRN
  Administered 2018-01-07: 10 mL

## 2018-01-07 MED ORDER — LACTATED RINGERS IV SOLN: INTRAVENOUS | Status: DC

## 2018-01-07 MED ORDER — LIDOCAINE 2% (20 MG/ML) 5 ML SYRINGE
INTRAMUSCULAR | Status: DC | PRN
  Administered 2018-01-07: 40 mg via INTRAVENOUS

## 2018-01-07 MED ORDER — LACTATED RINGERS IV SOLN
INTRAVENOUS | Status: DC
  Administered 2018-01-07: 12:00:00 via INTRAVENOUS

## 2018-01-07 MED ORDER — CEFAZOLIN SODIUM-DEXTROSE 2-4 GM/100ML-% IV SOLN
INTRAVENOUS | Status: AC
  Filled 2018-01-07: qty 100

## 2018-01-07 MED ORDER — SUGAMMADEX SODIUM 200 MG/2ML IV SOLN
INTRAVENOUS | Status: DC | PRN
  Administered 2018-01-07: 200 mg via INTRAVENOUS

## 2018-01-07 MED ORDER — METOCLOPRAMIDE HCL 5 MG/ML IJ SOLN
10.0000 mg | Freq: Once | INTRAMUSCULAR | Status: AC | PRN
  Administered 2018-01-07: 10 mg via INTRAVENOUS

## 2018-01-07 MED ORDER — SODIUM CHLORIDE 0.9 % IV SOLN: 250.0000 mL | INTRAVENOUS | Status: DC | PRN

## 2018-01-07 MED ORDER — BUPIVACAINE-EPINEPHRINE (PF) 0.25% -1:200000 IJ SOLN
INTRAMUSCULAR | Status: DC | PRN
  Administered 2018-01-07: 20 mL

## 2018-01-07 MED ORDER — DEXAMETHASONE SODIUM PHOSPHATE 10 MG/ML IJ SOLN
INTRAMUSCULAR | Status: DC | PRN
  Administered 2018-01-07: 10 mg via INTRAVENOUS

## 2018-01-07 MED ORDER — CEFAZOLIN SODIUM-DEXTROSE 2-4 GM/100ML-% IV SOLN
2.0000 g | INTRAVENOUS | Status: AC
  Administered 2018-01-07: 2 g via INTRAVENOUS

## 2018-01-07 MED ORDER — PROPOFOL 10 MG/ML IV BOLUS
INTRAVENOUS | Status: AC
  Filled 2018-01-07: qty 20

## 2018-01-07 MED ORDER — OXYCODONE HCL 5 MG PO TABS: 5.0000 mg | ORAL_TABLET | ORAL | Status: DC | PRN

## 2018-01-07 MED ORDER — BUPIVACAINE-EPINEPHRINE (PF) 0.25% -1:200000 IJ SOLN
INTRAMUSCULAR | Status: AC
  Filled 2018-01-07: qty 30

## 2018-01-07 SURGICAL SUPPLY — 43 items
ADH SKN CLS APL DERMABOND .7 (GAUZE/BANDAGES/DRESSINGS) ×4
APPLIER CLIP ROT 10 11.4 M/L (STAPLE) ×2
BAG SPEC RTRVL 10 TROC 200 (ENDOMECHANICALS)
BLADE CLIPPER SURG (BLADE) IMPLANT
CANISTER SUCT 3000ML PPV (MISCELLANEOUS) ×2 IMPLANT
CHLORAPREP W/TINT 26ML (MISCELLANEOUS) ×2 IMPLANT
CLIP APPLIE ROT 10 11.4 M/L (STAPLE) ×1 IMPLANT
COVER MAYO STAND STRL (DRAPES) ×2 IMPLANT
COVER SURGICAL LIGHT HANDLE (MISCELLANEOUS) ×2 IMPLANT
COVER WAND RF STERILE (DRAPES) ×2 IMPLANT
DERMABOND ADVANCED (GAUZE/BANDAGES/DRESSINGS) ×4
DERMABOND ADVANCED .7 DNX12 (GAUZE/BANDAGES/DRESSINGS) ×4 IMPLANT
DRAPE C-ARM 42X72 X-RAY (DRAPES) ×2 IMPLANT
ELECT REM PT RETURN 9FT ADLT (ELECTROSURGICAL) ×2
ELECTRODE REM PT RTRN 9FT ADLT (ELECTROSURGICAL) ×1 IMPLANT
GLOVE BIO SURGEON STRL SZ7.5 (GLOVE) ×2 IMPLANT
GLOVE EUDERMIC 7 POWDERFREE (GLOVE) ×2 IMPLANT
GLOVE INDICATOR 7.5 STRL GRN (GLOVE) ×2 IMPLANT
GOWN STRL REUS W/ TWL LRG LVL3 (GOWN DISPOSABLE) ×2 IMPLANT
GOWN STRL REUS W/ TWL XL LVL3 (GOWN DISPOSABLE) ×1 IMPLANT
GOWN STRL REUS W/TWL LRG LVL3 (GOWN DISPOSABLE) ×4
GOWN STRL REUS W/TWL XL LVL3 (GOWN DISPOSABLE) ×1
KIT BASIN OR (CUSTOM PROCEDURE TRAY) ×2 IMPLANT
KIT TURNOVER KIT B (KITS) ×2 IMPLANT
NS IRRIG 1000ML POUR BTL (IV SOLUTION) ×2 IMPLANT
PAD ARMBOARD 7.5X6 YLW CONV (MISCELLANEOUS) ×2 IMPLANT
POUCH RETRIEVAL ECOSAC 10 (ENDOMECHANICALS) IMPLANT
POUCH RETRIEVAL ECOSAC 10MM (ENDOMECHANICALS)
POUCH SPECIMEN RETRIEVAL 10MM (ENDOMECHANICALS) IMPLANT
SCISSORS LAP 5X35 DISP (ENDOMECHANICALS) ×2 IMPLANT
SET CHOLANGIOGRAPH 5 50 .035 (SET/KITS/TRAYS/PACK) ×2 IMPLANT
SET IRRIG TUBING LAPAROSCOPIC (IRRIGATION / IRRIGATOR) ×2 IMPLANT
SLEEVE ENDOPATH XCEL 5M (ENDOMECHANICALS) ×2 IMPLANT
SPECIMEN JAR SMALL (MISCELLANEOUS) ×2 IMPLANT
SUT MNCRL AB 4-0 PS2 18 (SUTURE) ×2 IMPLANT
TOWEL OR 17X24 6PK STRL BLUE (TOWEL DISPOSABLE) ×2 IMPLANT
TOWEL OR 17X26 10 PK STRL BLUE (TOWEL DISPOSABLE) ×2 IMPLANT
TRAY LAPAROSCOPIC MC (CUSTOM PROCEDURE TRAY) ×2 IMPLANT
TROCAR XCEL BLUNT TIP 100MML (ENDOMECHANICALS) ×2 IMPLANT
TROCAR XCEL NON-BLD 11X100MML (ENDOMECHANICALS) ×2 IMPLANT
TROCAR XCEL NON-BLD 5MMX100MML (ENDOMECHANICALS) ×2 IMPLANT
TUBING INSUFFLATION (TUBING) ×2 IMPLANT
WATER STERILE IRR 1000ML POUR (IV SOLUTION) ×2 IMPLANT

## 2018-01-07 NOTE — Anesthesia Preprocedure Evaluation (Addendum)
Anesthesia Evaluation  Patient identified by MRN, date of birth, ID band Patient awake    Reviewed: Allergy & Precautions, NPO status , Patient's Chart, lab work & pertinent test results  History of Anesthesia Complications (+) Family history of anesthesia reaction  Airway Mallampati: II  TM Distance: >3 FB Neck ROM: Full    Dental no notable dental hx. (+) Teeth Intact   Pulmonary pneumonia, resolved,    Pulmonary exam normal breath sounds clear to auscultation       Cardiovascular negative cardio ROS Normal cardiovascular exam Rhythm:Regular Rate:Normal     Neuro/Psych  Headaches, PSYCHIATRIC DISORDERS Depression ADHDRadiculopathy Prosthetic Right eye  Neuromuscular disease    GI/Hepatic (+) Hepatitis -, CS/P treatment of Hepatitis C Cholelithiasis   Endo/Other  negative endocrine ROS  Renal/GU negative Renal ROS  negative genitourinary   Musculoskeletal  (+) Arthritis , Osteoarthritis,    Abdominal   Peds  Hematology  (+) anemia ,   Anesthesia Other Findings   Reproductive/Obstetrics                            Anesthesia Physical Anesthesia Plan  ASA: II  Anesthesia Plan: General   Post-op Pain Management:    Induction: Intravenous  PONV Risk Score and Plan: Ondansetron, Dexamethasone and Treatment may vary due to age or medical condition  Airway Management Planned: Oral ETT  Additional Equipment:   Intra-op Plan:   Post-operative Plan: Extubation in OR  Informed Consent: I have reviewed the patients History and Physical, chart, labs and discussed the procedure including the risks, benefits and alternatives for the proposed anesthesia with the patient or authorized representative who has indicated his/her understanding and acceptance.   Dental advisory given  Plan Discussed with: CRNA and Surgeon  Anesthesia Plan Comments:         Anesthesia Quick  Evaluation

## 2018-01-07 NOTE — Anesthesia Postprocedure Evaluation (Signed)
Anesthesia Post Note  Patient: [name]  Procedure(s) Performed: LAPAROSCOPIC CHOLECYSTECTOMY WITH INTRAOPERATIVE CHOLANGIOGRAM (N/A Abdomen)     Patient location during evaluation: PACU Anesthesia Type: General Level of consciousness: awake and alert and oriented Pain management: pain level controlled Vital Signs Assessment: post-procedure vital signs reviewed and stable Respiratory status: spontaneous breathing, nonlabored ventilation, respiratory function stable and patient connected to nasal cannula oxygen Cardiovascular status: blood pressure returned to baseline and stable Postop Assessment: no apparent nausea or vomiting Anesthetic complications: no    Last Vitals:  Vitals:   01/07/18 1650 01/07/18 1700  BP:  133/78  Pulse: 61 67  Resp: 14 20  Temp:    SpO2: 100% 100%    Last Pain:  Vitals:   01/07/18 1700  TempSrc:   PainSc: 6                  [name] A.

## 2018-01-07 NOTE — Op Note (Signed)
Patient Name:           [name]   Date of Surgery:        01/07/2018  Pre op Diagnosis:      Chronic cholecystitis with cholelithiasis  Post op Diagnosis:    Same  Procedure:                 Laparoscopic cholecystectomy with cholangiogram  Surgeon:                     Edsel Petrin. Dalbert Batman, M.D., FACS  Assistant:                      RNFA   Indication for Assistant: Somewhat complex anatomy.  Provide exposure.  Reduce complications  Operative Indications:  . This is a very pleasant healthy 62 year old female, referred by Dr. [name] for symptomatic gallstones.     She is generally healthy. She says she has had decreased appetite for about 1 year and is had unintentional 10 pound weight loss. For about 1 months she's been having almost daily episodes of epigastric and right upper quadrant pain and nausea. She's had 1 episode of vomiting. Her stools are light brown in color but no diarrhea. She's having almost daily symptoms. On December 07, 2017 she saw Dr. Sarajane Jews. CBC, complete metabolic panel, lipase were normal. On November 20 she had an ultrasound which shows gallstones but no inflammatory changes. CBD 7 mm. Liver pancreas and kidneys look normal. No other abnormalities.     I think that her symptoms are due to her gallbladder. Very typical. Documented stones. Doubt other visceral disease. Because of almost daily symptoms I advised urgent cholecystectomy and she wants to do that. She will be scheduled for laparoscopic cholecystectomy with cholangiogram, possible open cholecystectomy.   Operative Findings:       The gallbladder appeared discolored and chronically inflamed.  It was relatively thin-walled.  The cystic duct was slightly large and very short and as I created a large window for a critical view it was apparent that I was tenting up the common bile duct.  A cholangiogram was done to clarify her anatomy.  This showed normal intrahepatic and extrahepatic biliary  tree, short cystic duct, no filling defect, no obstruction and good flow of contrast into the duodenum.  Four-quadrant inspection showed no evidence of any other visceral disease.  Procedure in Detail:          Following the induction of general endotracheal anesthesia the patient's abdomen was prepped and draped in a sterile fashion.  Intravenous antibiotics were given.  0.25% Marcaine with epinephrine was used as a local infiltration anesthetic.     A vertical incision was made at the lower rim of the umbilicus.  The fascia was incised in the midline.  The abdominal cavity was entered under direct vision.  An 11 mm Hassan trocar was inserted and secured with the pursestring suture of 0 Vicryl.  Pneumoperitoneum was treated.  Video camera was inserted.  A trocar was placed in the subxiphoid region and 2 5 mm trochars in the right upper quadrant.  We elevated the fundus of the gallbladder.  We pulled the infundibulum laterally.  We had to spend some time to take some adhesions down off of the neck of the gallbladder and very carefully dissected out the cystic duct and cystic artery.  The cystic duct was a little bit large and the cystic artery was small.  A cholangiogram catheter was inserted into the cystic duct and a cholangiogram was performed with the C arm as described above.  The cholangiogram catheter was removed.  The cystic duct was secured with multiple metal clips and divided.  The cystic artery was isolated and secured with metal clips and divided.  The gallbladder was then fairly easily dissected from its bed with electrocautery, placed in a specimen bag and removed.  The operative field was irrigated.  Hemostasis was excellent.  There was no bile leak.  Irrigation fluid was completely clear.  The trochars were removed and the pneumoperitoneum was released.  The fascia at the umbilicus was closed with 0 Vicryl sutures.  The skin incisions were closed with subcuticular 4-0 Monocryl and Dermabond.  The  patient tolerated the procedure well and was taken to PACU in stable condition.  EBL 10 cc.  Counts correct.  Complications none.    Addendum: I logged onto the [employer] and reviewed her prescription medication history     Immanuel Fedak M. Dalbert Batman, M.D., FACS General and Minimally Invasive Surgery Breast and Colorectal Surgery  01/07/2018 4:27 PM

## 2018-01-07 NOTE — Discharge Instructions (Signed)
[facility] ______[facility] °LAPAROSCOPIC SURGERY: POST OP INSTRUCTIONS °Always review your discharge instruction sheet given to you by the facility where your surgery was performed. °IF YOU HAVE DISABILITY OR FAMILY LEAVE FORMS, YOU MUST BRING THEM TO THE OFFICE FOR PROCESSING.   °DO NOT GIVE THEM TO YOUR DOCTOR. ° °1. A prescription for pain medication may be given to you upon discharge.  Take your pain medication as prescribed, if needed.  If narcotic pain medicine is not needed, then you may take acetaminophen (Tylenol) or ibuprofen (Advil) as needed. °2. Take your usually prescribed medications unless otherwise directed. °3. If you need a refill on your pain medication, please contact your pharmacy.  They will contact our office to request authorization. Prescriptions will not be filled after 5pm or on week-ends. °4. You should follow a light diet the first few days after arrival home, such as soup and crackers, etc.  Be sure to include lots of fluids daily. °5. Most patients will experience some swelling and bruising in the area of the incisions.  Ice packs will help.  Swelling and bruising can take several days to resolve.  °6. It is common to experience some constipation if taking pain medication after surgery.  Increasing fluid intake and taking a stool softener (such as Colace) will usually help or prevent this problem from occurring.  A mild laxative (Milk of Magnesia or Miralax) should be taken according to package instructions if there are no bowel movements after 48 hours. °7. Unless discharge instructions indicate otherwise, you may remove your bandages 24-48 hours after surgery, and you may shower at that time.  You may have steri-strips (small skin tapes) in place directly over the incision.  These strips should be left on the skin for 7-10 days.  If your surgeon used skin glue on the incision, you may shower in 24 hours.  The glue will flake off over the next 2-3 weeks.  Any sutures or  staples will be removed at the office during your follow-up visit. °8. ACTIVITIES:  You may resume regular (light) daily activities beginning the next day--such as daily self-care, walking, climbing stairs--gradually increasing activities as tolerated.  You may have sexual intercourse when it is comfortable.  Refrain from any heavy lifting or straining until approved by your doctor. °a. You may drive when you are no longer taking prescription pain medication, you can comfortably wear a seatbelt, and you can safely maneuver your car and apply brakes. °b. RETURN TO WORK:  __________________________________________________________ °9. You should see your doctor in the office for a follow-up appointment approximately 2-3 weeks after your surgery.  Make sure that you call for this appointment within a day or two after you arrive home to insure a convenient appointment time. °10. OTHER INSTRUCTIONS: __________________________________________________________________________________________________________________________ __________________________________________________________________________________________________________________________ °WHEN TO CALL YOUR DOCTOR: °1. Fever over 101.0 °2. Inability to urinate °3. Continued bleeding from incision. °4. Increased pain, redness, or drainage from the incision. °5. Increasing abdominal pain ° °The clinic staff is available to answer your questions during regular business hours.  Please don’t hesitate to call and ask to speak to one of the nurses for clinical concerns.  If you have a medical emergency, go to the nearest emergency room or call 911.  A surgeon from [facility] is always on call at the hospital. °[address], [address], [place], [place]  [address] ? [address] [address], [place], [place]   [address] °[phone] ? [phone] ? FAX [phone] °Web site:   [email or web address] °

## 2018-01-07 NOTE — Transfer of Care (Signed)
Immediate Anesthesia Transfer of Care Note  Patient: [name]  Procedure(s) Performed: LAPAROSCOPIC CHOLECYSTECTOMY WITH INTRAOPERATIVE CHOLANGIOGRAM (N/A Abdomen)  Patient Location: PACU  Anesthesia Type:General  Level of Consciousness: awake, alert  and oriented  Airway & Oxygen Therapy: Patient Spontanous Breathing and Patient connected to nasal cannula oxygen  Post-op Assessment: Report given to RN and Post -op Vital signs reviewed and stable  Post vital signs: Reviewed and stable  Last Vitals:  Vitals Value Taken Time  BP 135/73 01/07/2018  4:31 PM  Temp 36.4 C 01/07/2018  4:31 PM  Pulse 57 01/07/2018  4:35 PM  Resp 13 01/07/2018  4:35 PM  SpO2 100 % 01/07/2018  4:35 PM  Vitals shown include unvalidated device data.  Last Pain:  Vitals:   01/07/18 1218  TempSrc: Oral  PainSc: 0-No pain      Patients Stated Pain Goal: 2 (20/23/34 3568)  Complications: No apparent anesthesia complications

## 2018-01-07 NOTE — Anesthesia Procedure Notes (Signed)
Procedure Name: Intubation Date/Time: 01/07/2018 3:12 PM Performed by: Marsa Aris, CRNA Pre-anesthesia Checklist: Patient identified, Emergency Drugs available, Suction available and Patient being monitored Patient Re-evaluated:Patient Re-evaluated prior to induction Oxygen Delivery Method: Circle System Utilized Preoxygenation: Pre-oxygenation with 100% oxygen Induction Type: IV induction Ventilation: Mask ventilation without difficulty Laryngoscope Size: Mac and 3 Grade View: Grade II Tube type: Oral Number of attempts: 1 Airway Equipment and Method: Stylet and Oral airway Placement Confirmation: ETT inserted through vocal cords under direct vision,  positive ETCO2 and breath sounds checked- equal and bilateral Secured at: 21 cm Tube secured with: Tape Dental Injury: Teeth and Oropharynx as per pre-operative assessment  Comments: No change in dentition from pre-procedure

## 2018-01-07 NOTE — Interval H&P Note (Signed)
History and Physical Interval Note:  01/07/2018 2:15 PM  [name]  has presented today for surgery, with the diagnosis of Gallstones  The various methods of treatment have been discussed with the patient and family. After consideration of risks, benefits and other options for treatment, the patient has consented to  Procedure(s): LAPAROSCOPIC CHOLECYSTECTOMY WITH INTRAOPERATIVE CHOLANGIOGRAM (N/A) as a surgical intervention .  The patient's history has been reviewed, patient examined, no change in status, stable for surgery.  I have reviewed the patient's chart and labs.  Questions were answered to the patient's satisfaction.     Adin Hector

## 2018-01-08 ENCOUNTER — Encounter (HOSPITAL_COMMUNITY): Payer: Self-pay | Admitting: General Surgery

## 2018-01-12 ENCOUNTER — Ambulatory Visit: Payer: 59 | Admitting: Family Medicine

## 2018-01-27 MED FILL — HYDROCODON-APAP 10-325: 10-325 | 30 days supply | Qty: 120 | Fill #0

## 2018-01-27 MED FILL — GABAPENTIN 300 MG CAPSULE: 300 | 30 days supply | Qty: 90 | Fill #2

## 2018-01-27 MED FILL — CELECOXIB 200 MG CAP: 200 | 30 days supply | Qty: 60 | Fill #3

## 2018-02-07 MED FILL — ADDERALL XR 20 MG CAP SA: 20 | 30 days supply | Qty: 30 | Fill #0

## 2018-02-09 ENCOUNTER — Telehealth: Payer: Self-pay | Admitting: Family Medicine

## 2018-02-09 NOTE — Telephone Encounter (Signed)
Division of Motor Vehicles form to be filled out.  Placed in dr's folder.  Fax form to info on paperwork and call patient to pick up upon completion.

## 2018-02-10 NOTE — Telephone Encounter (Signed)
Dr. Sarajane Jews this form has been placed in the red sign folder.  Please advise once completed.  Thanks

## 2018-02-11 NOTE — Telephone Encounter (Signed)
The form is ready  

## 2018-02-11 NOTE — Telephone Encounter (Signed)
I have called the pt and she is aware that the forms are completed and she will come by to pick these up.  Copies will be made and placed in the scan folder.

## 2018-02-28 MED FILL — GABAPENTIN 300 MG CAPSULE: 300 | 30 days supply | Qty: 90 | Fill #3

## 2018-02-28 MED FILL — CELECOXIB 200 MG CAP: 200 | 30 days supply | Qty: 60 | Fill #4

## 2018-02-28 MED FILL — valACYclovir HCL 1 GM TABS: 1 | 90 days supply | Qty: 90 | Fill #3

## 2018-02-28 MED FILL — HYDROCODON-APAP 10-325: 10-325 | 30 days supply | Qty: 120 | Fill #0

## 2018-03-08 MED FILL — ADDERALL XR 20 MG CAP SA: 20 | 30 days supply | Qty: 30 | Fill #0

## 2018-03-22 ENCOUNTER — Ambulatory Visit: Payer: 59 | Admitting: Family Medicine

## 2018-03-22 ENCOUNTER — Encounter: Payer: Self-pay | Admitting: Family Medicine

## 2018-03-22 ENCOUNTER — Ambulatory Visit (INDEPENDENT_AMBULATORY_CARE_PROVIDER_SITE_OTHER): Payer: 59

## 2018-03-22 VITALS — BP 132/88 | HR 83 | Temp 97.5°F | Ht 61.5 in | Wt 108.6 lb

## 2018-03-22 DIAGNOSIS — M25562 Pain in left knee: Secondary | ICD-10-CM

## 2018-03-22 MED ORDER — DICLOFENAC SODIUM 2 % TD SOLN: 1.0000 "application " | Freq: Two times a day (BID) | TRANSDERMAL | 3 refills | Status: DC

## 2018-03-22 NOTE — Progress Notes (Signed)
[name] - 63 y.o. female MRN [id number]  Date of birth: 02-26-1955  SUBJECTIVE:  Including CC & ROS.  Chief Complaint  Patient presents with  . Pain    left knee pain/ fell straight on kneecap 1 week ago/ hurts to touch, when walking sharp pain    [name] is a 63 y.o. female that is presenting with left knee pain.  1 week ago she fell straight onto the proximal tibia/knee.  Since that time she has had worsening knee pain.  Has not gotten any better with rest or home modalities.  Denies any significant swelling.  Pain is constant and severe.  She is limping.  Denies any radiation.  She feels the pain at the proximal tibia.  Does have bruising occurring in that area.  Pain is worse with any kind of ambulation.   Review of Systems  Constitutional: Negative for fever.  HENT: Negative for congestion.   Respiratory: Negative for cough.   Cardiovascular: Negative for chest pain.  Gastrointestinal: Negative for abdominal pain.  Musculoskeletal: Positive for arthralgias and gait problem.  Skin: Positive for color change.  Neurological: Negative for weakness.  Hematological: Negative for adenopathy.  Psychiatric/Behavioral: Negative for agitation.    HISTORY: Past Medical, Surgical, Social, and Family History Reviewed & Updated per EMR.   Pertinent Historical Findings include:  Past Medical History:  Diagnosis Date  . Arthritis   . Depression   . Gallstones 01/07/2018  . Ganglion cyst of dorsum of right wrist   . Headache   . Osteoporosis   . Pneumonia 2009  . Prosthetic eye globe     Past Surgical History:  Procedure Laterality Date  . ANTERIOR CERVICAL DECOMP/DISCECTOMY FUSION N/A 12/12/2014   Procedure: ANTERIOR CERVICAL DECOMPRESSION/DISCECTOMY FUSION 3 LEVELS;  Surgeon: Phylliss Bob, MD;  Location: [facility];  Service: Orthopedics;  Laterality: N/A;  Anterior cervical decompression fusion, cerivcal 5-6, cervical 6-7, cervical 7-thoracic 1 with instrumentation and  allografrt  . APPENDECTOMY  1974  . CHOLECYSTECTOMY N/A 01/07/2018   Procedure: LAPAROSCOPIC CHOLECYSTECTOMY WITH INTRAOPERATIVE CHOLANGIOGRAM;  Surgeon: Fanny Skates, MD;  Location: [facility];  Service: General;  Laterality: N/A;  . COLONOSCOPY  04/14/2013   per Dr. Ardis Hughs, clear, repeat in 10 yrs   . CORNEAL TRANSPLANT Right 2007   x4  . ENUCLEATION Right 2013   eye infection  . GANGLION CYST EXCISION Right 05/25/2016   Procedure: RIGHT WRIST ULNAR GANGLION EXCISION;  Surgeon: Milly Jakob, MD;  Location: [facility];  Service: Orthopedics;  Laterality: Right;  . RHINOPLASTY  1980  . TONSILLECTOMY     removed in 3rd grade    No Known Allergies  Family History  Problem Relation Age of Onset  . Breast cancer Maternal Aunt   . Colon cancer Neg Hx      Social History   Socioeconomic History  . Marital status: Single    Spouse name: Not on file  . Number of children: Not on file  . Years of education: Not on file  . Highest education level: Not on file  Occupational History  . Not on file  Social Needs  . Financial resource strain: Not on file  . Food insecurity:    Worry: Not on file    Inability: Not on file  . Transportation needs:    Medical: Not on file    Non-medical: Not on file  Tobacco Use  . Smoking status: Never Smoker  . Smokeless tobacco: Never Used  Substance  and Sexual Activity  . Alcohol use: No    Alcohol/week: 0.0 standard drinks  . Drug use: No  . Sexual activity: Never    Birth control/protection: Post-menopausal  Lifestyle  . Physical activity:    Days per week: Not on file    Minutes per session: Not on file  . Stress: Not on file  Relationships  . Social connections:    Talks on phone: Not on file    Gets together: Not on file    Attends religious service: Not on file    Active member of club or organization: Not on file    Attends meetings of clubs or organizations: Not on file    Relationship status: Not on file  .  Intimate partner violence:    Fear of current or ex partner: Not on file    Emotionally abused: Not on file    Physically abused: Not on file    Forced sexual activity: Not on file  Other Topics Concern  . Not on file  Social History Narrative  . Not on file     PHYSICAL EXAM:  VS: BP 132/88   Pulse 83   Temp (!) 97.5 F (36.4 C) (Oral)   Ht 5' 1.5" (1.562 m)   Wt 108 lb 9.6 oz (49.3 kg)   SpO2 98%   BMI 20.19 kg/m  Physical Exam Gen: NAD, alert, cooperative with exam, well-appearing ENT: normal lips, normal nasal mucosa,  Eye: normal EOM, normal conjunctiva and lids CV:  no edema, +2 pedal pulses   Resp: no accessory muscle use, non-labored,  Skin: no rashes, no areas of induration  Neuro: normal tone, normal sensation to touch Psych:  normal insight, alert and oriented MSK:  Left knee: Ecchymosis on the medial proximal tibia. No significant effusion in the suprapatellar pouch. Normal passive range of motion. Pain with ambulation. No significant instability with valgus varus stress testing. Some pain with McMurray's testing. Neurovascular intact  Limited ultrasound: Left knee:  No significant effusion of the suprapatellar pouch. Normal-appearing quadricep and patellar tendon. There appears to be a significant hypervascularity at the medial proximal tibia which could represent a fracture. Normal-appearing medial joint line with changes to suggest a chronic degenerative tear  Summary: Findings suggest a possible fracture of the proximal medial tibia  Ultrasound and interpretation by Clearance Coots, MD   Aspiration/Injection Procedure Note [name] 06-Apr-1955  Procedure: Injection Indications: Left knee pain  Procedure Details Consent: Risks of procedure as well as the alternatives and risks of each were explained to the (patient/caregiver).  Consent for procedure obtained. Time Out: Verified patient identification, verified procedure, site/side was  marked, verified correct patient position, special equipment/implants available, medications/allergies/relevent history reviewed, required imaging and test results available.  Performed.  The area was cleaned with iodine and alcohol swabs.    The left knee superior lateral suprapatellar pouch was injected using 1 cc's of 40 mgKenalog and 4 cc's of 0.25% bupivacaine with a 22 1 1/2" needle.  Ultrasound was used. Images were obtained in long views showing the injection.     A sterile dressing was applied.  Patient did tolerate procedure well.      ASSESSMENT & PLAN:   Acute pain of left knee Independent review of the x-ray does not demonstrate a fracture but ultrasound suggest that the proximal medial tibia could have a small fracture in this area and that is where most of her pain is localized. -Injection. -Provided Ace wrap. -Advised to follow-up  in 1 week.  If no improvement may need to consider MRI

## 2018-03-22 NOTE — Patient Instructions (Signed)
Nice to meet you  Please try to ice the knee  Please try compression  Please see me back in 2-3 weeks if no better.    Glucosamine sulfate 750mg  twice a day is a supplement that has been shown to help moderate to severe arthritis. Vitamin D 2000 IU daily Fish oil 2 grams daily.  Tumeric 500mg  twice daily.  Capsaicin topically up to four times a day may also help with pain.

## 2018-03-23 ENCOUNTER — Encounter: Payer: Self-pay | Admitting: Family Medicine

## 2018-03-23 ENCOUNTER — Telehealth: Payer: Self-pay | Admitting: Family Medicine

## 2018-03-23 DIAGNOSIS — M25562 Pain in left knee: Secondary | ICD-10-CM | POA: Insufficient documentation

## 2018-03-23 NOTE — Telephone Encounter (Signed)
Left VM for patient. If she calls back please have her speak with a nurse/CMA and inform that her xray didn't demonstrate a fracture. The [facility] can report results to patient.   If any questions then please take the best time and phone number to call and I will try to call her back.   Rosemarie Ax, MD [facility] 03/23/2018, 3:20 PM

## 2018-03-23 NOTE — Assessment & Plan Note (Signed)
Independent review of the x-ray does not demonstrate a fracture but ultrasound suggest that the proximal medial tibia could have a small fracture in this area and that is where most of her pain is localized. -Injection. -Provided Ace wrap. -Advised to follow-up in 1 week.  If no improvement may need to consider MRI

## 2018-03-24 LAB — VITAMIN D 25 HYDROXY (VIT D DEFICIENCY, FRACTURES): VITD: 55.57 ng/mL (ref 30.00–100.00)

## 2018-03-28 MED FILL — HYDROCODON-APAP 10-325: 10-325 | 30 days supply | Qty: 120 | Fill #0

## 2018-03-29 MED FILL — GABAPENTIN 300 MG CAPSULE: 300 | 30 days supply | Qty: 90 | Fill #4

## 2018-03-29 MED FILL — CELECOXIB 200 MG CAP: 200 | 30 days supply | Qty: 60 | Fill #5

## 2018-03-31 ENCOUNTER — Other Ambulatory Visit: Payer: Self-pay | Admitting: Family Medicine

## 2018-03-31 NOTE — Telephone Encounter (Signed)
Dr. Sarajane Jews please advise on refill of the adderall. Thanks

## 2018-04-01 MED ORDER — AMPHETAMINE-DEXTROAMPHET ER 20 MG PO CP24: 20.0000 mg | ORAL_CAPSULE | Freq: Every morning | ORAL | 0 refills | Status: DC

## 2018-04-01 MED FILL — ADDERALL XR 20 MG CAP SA: 20 | 30 days supply | Qty: 30 | Fill #0

## 2018-04-01 NOTE — Telephone Encounter (Signed)
Done

## 2018-04-06 DIAGNOSIS — Z970 Presence of artificial eye: Secondary | ICD-10-CM | POA: Diagnosis not present

## 2018-04-06 DIAGNOSIS — H04122 Dry eye syndrome of left lacrimal gland: Secondary | ICD-10-CM | POA: Diagnosis not present

## 2018-04-06 DIAGNOSIS — H2512 Age-related nuclear cataract, left eye: Secondary | ICD-10-CM | POA: Diagnosis not present

## 2018-04-08 ENCOUNTER — Telehealth: Payer: Self-pay

## 2018-04-08 NOTE — Telephone Encounter (Signed)
LVm for pt to call back to reschedule to a morning appt due to seeing well pts in morning and sick pts in afternoon up to pt if they would like to reschedule to morning appt at there risk. Okay for nurse triage to disclose. CRM created

## 2018-04-13 ENCOUNTER — Encounter: Payer: Self-pay | Admitting: Family Medicine

## 2018-04-13 ENCOUNTER — Ambulatory Visit: Payer: 59 | Admitting: Family Medicine

## 2018-04-18 MED FILL — GABAPENTIN 300 MG CAPSULE: 300 | 30 days supply | Qty: 90 | Fill #5

## 2018-04-18 MED FILL — CELECOXIB 200 MG CAP: 200 | 90 days supply | Qty: 180 | Fill #6

## 2018-04-22 ENCOUNTER — Ambulatory Visit (INDEPENDENT_AMBULATORY_CARE_PROVIDER_SITE_OTHER): Payer: 59 | Admitting: Family Medicine

## 2018-04-22 ENCOUNTER — Other Ambulatory Visit: Payer: Self-pay

## 2018-04-22 ENCOUNTER — Telehealth: Payer: Self-pay | Admitting: Family Medicine

## 2018-04-22 DIAGNOSIS — M2550 Pain in unspecified joint: Secondary | ICD-10-CM | POA: Diagnosis not present

## 2018-04-22 DIAGNOSIS — F1190 Opioid use, unspecified, uncomplicated: Secondary | ICD-10-CM | POA: Diagnosis not present

## 2018-04-22 MED ORDER — HYDROCODONE-ACETAMINOPHEN 10-325 MG PO TABS: 1.0000 | ORAL_TABLET | Freq: Four times a day (QID) | ORAL | 0 refills | Status: DC | PRN

## 2018-04-22 MED ORDER — GABAPENTIN 300 MG PO CAPS: 300.0000 mg | ORAL_CAPSULE | Freq: Three times a day (TID) | ORAL | 1 refills | Status: DC

## 2018-04-22 NOTE — Telephone Encounter (Signed)
Does Dr Sarajane Jews want pt to have labs done?  Pt states for someone to call her to let her know.  She would like to get them done today.

## 2018-04-22 NOTE — Telephone Encounter (Signed)
I called the pt and informed her per Dr Sarajane Jews she only needs a urine drug screen and can do this at anytime.  Patient agreed to come in at a later date.

## 2018-04-24 ENCOUNTER — Encounter: Payer: Self-pay | Admitting: Family Medicine

## 2018-04-24 NOTE — Progress Notes (Signed)
Subjective:    Patient ID: [name], female    DOB: 13-Jun-1955, 63 y.o.   MRN: [id number]  HPI Virtual Visit via Video Note  I connected with the patient on 04-22-18 at  3:30 PM EDT by a video enabled telemedicine application and verified that I am speaking with the correct person using two identifiers.  Location patient: home Location provider:work or home office Persons participating in the virtual visit: patient, provider  I discussed the limitations of evaluation and management by telemedicine and the availability of in person appointments. The patient expressed understanding and agreed to proceed.   HPI: Here for pain management. She has been doing well.  Indication for chronic opioid: osteoarthritis  Medication and dose: Norco 10-325 # pills per month: 120 Last UDS date: 02-01-17 Opioid Treatment Agreement signed (Y/N): 04-27-17 Opioid Treatment Agreement last reviewed with patient:  04-22-18 N[facility] reviewed this encounter (include red flags):  04-22-18    ROS: See pertinent positives and negatives per HPI.  Past Medical History:  Diagnosis Date  . Arthritis   . Depression   . Gallstones 01/07/2018  . Ganglion cyst of dorsum of right wrist   . Headache   . Osteoporosis   . Pneumonia 2009  . Prosthetic eye globe     Past Surgical History:  Procedure Laterality Date  . ANTERIOR CERVICAL DECOMP/DISCECTOMY FUSION N/A 12/12/2014   Procedure: ANTERIOR CERVICAL DECOMPRESSION/DISCECTOMY FUSION 3 LEVELS;  Surgeon: Phylliss Bob, MD;  Location: [place];  Service: Orthopedics;  Laterality: N/A;  Anterior cervical decompression fusion, cerivcal 5-6, cervical 6-7, cervical 7-thoracic 1 with instrumentation and allografrt  . APPENDECTOMY  1974  . CHOLECYSTECTOMY N/A 01/07/2018   Procedure: LAPAROSCOPIC CHOLECYSTECTOMY WITH INTRAOPERATIVE CHOLANGIOGRAM;  Surgeon: Fanny Skates, MD;  Location: [place];  Service: General;  Laterality: N/A;  . COLONOSCOPY  04/14/2013   per Dr.  Ardis Hughs, clear, repeat in 10 yrs   . CORNEAL TRANSPLANT Right 2007   x4  . ENUCLEATION Right 2013   eye infection  . GANGLION CYST EXCISION Right 05/25/2016   Procedure: RIGHT WRIST ULNAR GANGLION EXCISION;  Surgeon: Milly Jakob, MD;  Location: [place];  Service: Orthopedics;  Laterality: Right;  . RHINOPLASTY  1980  . TONSILLECTOMY     removed in 3rd grade    Family History  Problem Relation Age of Onset  . Breast cancer Maternal Aunt   . Colon cancer Neg Hx     SOCIAL HX:    Current Outpatient Medications:  .  acetaminophen (TYLENOL) 500 MG tablet, Take 500 mg by mouth every 6 (six) hours as needed., Disp: , Rfl:  .  [START ON 06/01/2018] amphetamine-dextroamphetamine (ADDERALL XR) 20 MG 24 hr capsule, Take 1 capsule (20 mg total) by mouth every morning for 30 days., Disp: 30 capsule, Rfl: 0 .  B Complex-C (SUPER B COMPLEX PO), Take 1 tablet by mouth daily., Disp: , Rfl:  .  Calcium Carbonate-Vitamin D (CALCIUM 600+D) 600-400 MG-UNIT tablet, Take 2 tablets by mouth daily., Disp: , Rfl:  .  Carboxymethylcellul-Glycerin (REFRESH OPTIVE PF OP), Apply 1 drop to eye daily as needed (dry eyes)., Disp: , Rfl:  .  celecoxib (CELEBREX) 200 MG capsule, TAKE 1 CAPSULE BY MOUTH TWICE A DAY (Patient taking differently: Take 200 mg by mouth 2 (two) times daily. ), Disp: 60 capsule, Rfl: 11 .  Diclofenac Sodium (PENNSAID) 2 % SOLN, Place 1 application onto the skin 2 (two) times daily., Disp: 1 Bottle, Rfl: 3 .  gabapentin (NEURONTIN) 300 MG capsule, Take 1 capsule (300 mg total) by mouth 3 (three) times daily., Disp: 270 capsule, Rfl: 1 .  [START ON 06/22/2018] HYDROcodone-acetaminophen (NORCO) 10-325 MG tablet, Take 1 tablet by mouth every 6 (six) hours as needed for up to 30 days for moderate pain., Disp: 120 tablet, Rfl: 0 .  mometasone (NASONEX) 50 MCG/ACT nasal spray, INSTILL 2 SPRAYS INTO THE NOSE DAILY AS NEEDED FOR ALLERGIES, Disp: 17 g, Rfl: 11 .  ondansetron (ZOFRAN) 8  MG tablet, Take 1 tablet (8 mg total) by mouth every 6 (six) hours as needed for nausea or vomiting., Disp: 30 tablet, Rfl: 1 .  polyethylene glycol powder (GLYCOLAX/MIRALAX) powder, TAKE 17 GRAMS BY MOUTH 2 TIMES DAILY AS NEEDED. (Patient taking differently: Take 0.5 Containers by mouth daily as needed for moderate constipation. ), Disp: 3162 g, Rfl: 1 .  traMADol (ULTRAM) 50 MG tablet, Take 1-2 tablets (50-100 mg total) by mouth every 6 (six) hours as needed., Disp: 15 tablet, Rfl: 0 .  valACYclovir (VALTREX) 1000 MG tablet, Take 1,000 mg by mouth daily. , Disp: , Rfl:   EXAM:  VITALS per patient if applicable:  GENERAL: alert, oriented, appears well and in no acute distress  HEENT: atraumatic, conjunttiva clear, no obvious abnormalities on inspection of external nose and ears  NECK: normal movements of the head and neck  LUNGS: on inspection no signs of respiratory distress, breathing rate appears normal, no obvious gross SOB, gasping or wheezing  CV: no obvious cyanosis  MS: moves all visible extremities without noticeable abnormality  PSYCH/NEURO: pleasant and cooperative, no obvious depression or anxiety, speech and thought processing grossly intact  ASSESSMENT AND PLAN: Pain management, meds were refilled.  Discussed the following assessment and plan:  Chronic narcotic use - Plan: Pain Mgmt, Profile 8 w/Conf, U     I discussed the assessment and treatment plan with the patient. The patient was provided an opportunity to ask questions and all were answered. The patient agreed with the plan and demonstrated an understanding of the instructions.   The patient was advised to call back or seek an in-person evaluation if the symptoms worsen or if the condition fails to improve as anticipated.  I provided 14 minutes of non-face-to-face time during this encounter.    Review of Systems     Objective:   Physical Exam        Assessment & Plan:

## 2018-04-25 MED FILL — HYDROCODON-APAP 10-325: 10-325 | 30 days supply | Qty: 120 | Fill #0

## 2018-04-27 ENCOUNTER — Ambulatory Visit: Payer: 59 | Admitting: Family Medicine

## 2018-05-02 MED FILL — ADDERALL XR 20 MG CAP SA: 20 | 30 days supply | Qty: 30 | Fill #0

## 2018-05-25 MED FILL — valACYclovir HCL 1 GM TABS: 1 | 90 days supply | Qty: 90 | Fill #4

## 2018-05-25 MED FILL — GABAPENTIN 300 MG CAPSULE: 300 | 90 days supply | Qty: 270 | Fill #0

## 2018-05-27 MED FILL — metroNIDAZOLE 0.75 % CREA: 0.75 | 30 days supply | Qty: 45 | Fill #1

## 2018-05-30 MED FILL — HYDROCODON-APAP 10-325: 10-325 | 30 days supply | Qty: 120 | Fill #0

## 2018-06-01 MED FILL — ADDERALL XR 20 MG CAP SA: 20 | 30 days supply | Qty: 30 | Fill #0

## 2018-06-29 ENCOUNTER — Other Ambulatory Visit: Payer: Self-pay | Admitting: Family Medicine

## 2018-06-29 MED FILL — HYDROCODON-APAP 10-325: 10-325 | 30 days supply | Qty: 120 | Fill #0

## 2018-07-01 ENCOUNTER — Encounter: Payer: Self-pay | Admitting: Family Medicine

## 2018-07-01 MED ORDER — AMPHETAMINE-DEXTROAMPHET ER 20 MG PO CP24: 20.0000 mg | ORAL_CAPSULE | Freq: Every morning | ORAL | 0 refills | Status: DC

## 2018-07-01 MED FILL — ADDERALL XR 20 MG CAP SA: 20 | 30 days supply | Qty: 30 | Fill #0

## 2018-07-01 NOTE — Telephone Encounter (Signed)
Please advise 

## 2018-07-01 NOTE — Telephone Encounter (Signed)
Done

## 2018-07-20 ENCOUNTER — Ambulatory Visit (INDEPENDENT_AMBULATORY_CARE_PROVIDER_SITE_OTHER): Payer: 59 | Admitting: Family Medicine

## 2018-07-20 ENCOUNTER — Encounter: Payer: Self-pay | Admitting: Family Medicine

## 2018-07-20 ENCOUNTER — Other Ambulatory Visit: Payer: Self-pay

## 2018-07-20 DIAGNOSIS — F1190 Opioid use, unspecified, uncomplicated: Secondary | ICD-10-CM

## 2018-07-20 DIAGNOSIS — M2550 Pain in unspecified joint: Secondary | ICD-10-CM | POA: Diagnosis not present

## 2018-07-20 MED ORDER — HYDROCODONE-ACETAMINOPHEN 10-325 MG PO TABS: 1.0000 | ORAL_TABLET | Freq: Four times a day (QID) | ORAL | 0 refills | Status: DC | PRN

## 2018-07-20 MED ORDER — HYDROCODONE-ACETAMINOPHEN 10-325 MG PO TABS: 1.0000 | ORAL_TABLET | Freq: Four times a day (QID) | ORAL | 0 refills | Status: AC | PRN

## 2018-07-20 NOTE — Progress Notes (Signed)
Subjective:    Patient ID: [name], female    DOB: April 22, 1955, 63 y.o.   MRN: [id number]  HPI Virtual Visit via Video Note  I connected with the patient on 07/20/18 at  3:30 PM EDT by a video enabled telemedicine application and verified that I am speaking with the correct person using two identifiers.  Location patient: home Location provider:work or home office Persons participating in the virtual visit: patient, provider  I discussed the limitations of evaluation and management by telemedicine and the availability of in person appointments. The patient expressed understanding and agreed to proceed.   HPI: Here for pain management. She is doing well.  Indication for chronic opioid: osteoarthritis Medication and dose: Norco 10-325 # pills per month: 120 Last UDS date: 02-01-17 Opioid Treatment Agreement signed (Y/N): 04-30-17 Opioid Treatment Agreement last reviewed with patient:  07-20-18 N[facility]SRS reviewed this encounter (include red flags):  07-20-18    ROS: See pertinent positives and negatives per HPI.  Past Medical History:  Diagnosis Date  . Arthritis   . Depression   . Gallstones 01/07/2018  . Ganglion cyst of dorsum of right wrist   . Headache   . Osteoporosis   . Pneumonia 2009  . Prosthetic eye globe     Past Surgical History:  Procedure Laterality Date  . ANTERIOR CERVICAL DECOMP/DISCECTOMY FUSION N/A 12/12/2014   Procedure: ANTERIOR CERVICAL DECOMPRESSION/DISCECTOMY FUSION 3 LEVELS;  Surgeon: Phylliss Bob, MD;  Location: [place];  Service: Orthopedics;  Laterality: N/A;  Anterior cervical decompression fusion, cerivcal 5-6, cervical 6-7, cervical 7-thoracic 1 with instrumentation and allografrt  . APPENDECTOMY  1974  . CHOLECYSTECTOMY N/A 01/07/2018   Procedure: LAPAROSCOPIC CHOLECYSTECTOMY WITH INTRAOPERATIVE CHOLANGIOGRAM;  Surgeon: Fanny Skates, MD;  Location: [place];  Service: General;  Laterality: N/A;  . COLONOSCOPY  04/14/2013   per Dr. Ardis Hughs,  clear, repeat in 10 yrs   . CORNEAL TRANSPLANT Right 2007   x4  . ENUCLEATION Right 2013   eye infection  . GANGLION CYST EXCISION Right 05/25/2016   Procedure: RIGHT WRIST ULNAR GANGLION EXCISION;  Surgeon: Milly Jakob, MD;  Location: [place];  Service: Orthopedics;  Laterality: Right;  . RHINOPLASTY  1980  . TONSILLECTOMY     removed in 3rd grade    Family History  Problem Relation Age of Onset  . Breast cancer Maternal Aunt   . Colon cancer Neg Hx      Current Outpatient Medications:  .  acetaminophen (TYLENOL) 500 MG tablet, Take 500 mg by mouth every 6 (six) hours as needed., Disp: , Rfl:  .  [START ON 08/31/2018] amphetamine-dextroamphetamine (ADDERALL XR) 20 MG 24 hr capsule, Take 1 capsule (20 mg total) by mouth every morning for 30 days., Disp: 30 capsule, Rfl: 0 .  B Complex-C (SUPER B COMPLEX PO), Take 1 tablet by mouth daily., Disp: , Rfl:  .  Calcium Carbonate-Vitamin D (CALCIUM 600+D) 600-400 MG-UNIT tablet, Take 2 tablets by mouth daily., Disp: , Rfl:  .  Carboxymethylcellul-Glycerin (REFRESH OPTIVE PF OP), Apply 1 drop to eye daily as needed (dry eyes)., Disp: , Rfl:  .  celecoxib (CELEBREX) 200 MG capsule, TAKE 1 CAPSULE BY MOUTH TWICE A DAY (Patient taking differently: Take 200 mg by mouth 2 (two) times daily. ), Disp: 60 capsule, Rfl: 11 .  Diclofenac Sodium (PENNSAID) 2 % SOLN, Place 1 application onto the skin 2 (two) times daily., Disp: 1 Bottle, Rfl: 3 .  gabapentin (NEURONTIN) 300 MG capsule, Take  1 capsule (300 mg total) by mouth 3 (three) times daily., Disp: 270 capsule, Rfl: 1 .  [START ON 09/21/2018] HYDROcodone-acetaminophen (NORCO) 10-325 MG tablet, Take 1 tablet by mouth every 6 (six) hours as needed for up to 30 days for moderate pain., Disp: 120 tablet, Rfl: 0 .  mometasone (NASONEX) 50 MCG/ACT nasal spray, INSTILL 2 SPRAYS INTO THE NOSE DAILY AS NEEDED FOR ALLERGIES, Disp: 17 g, Rfl: 11 .  polyethylene glycol powder (GLYCOLAX/MIRALAX)  powder, TAKE 17 GRAMS BY MOUTH 2 TIMES DAILY AS NEEDED. (Patient taking differently: Take 0.5 Containers by mouth daily as needed for moderate constipation. ), Disp: 3162 g, Rfl: 1 .  valACYclovir (VALTREX) 1000 MG tablet, Take 1,000 mg by mouth daily. , Disp: , Rfl:   EXAM:  VITALS per patient if applicable:  GENERAL: alert, oriented, appears well and in no acute distress  HEENT: atraumatic, conjunttiva clear, no obvious abnormalities on inspection of external nose and ears  NECK: normal movements of the head and neck  LUNGS: on inspection no signs of respiratory distress, breathing rate appears normal, no obvious gross SOB, gasping or wheezing  CV: no obvious cyanosis  MS: moves all visible extremities without noticeable abnormality  PSYCH/NEURO: pleasant and cooperative, no obvious depression or anxiety, speech and thought processing grossly intact  ASSESSMENT AND PLAN: Pain management, meds were refilled.  [name], MD  Discussed the following assessment and plan:  No diagnosis found.     I discussed the assessment and treatment plan with the patient. The patient was provided an opportunity to ask questions and all were answered. The patient agreed with the plan and demonstrated an understanding of the instructions.   The patient was advised to call back or seek an in-person evaluation if the symptoms worsen or if the condition fails to improve as anticipated.     Review of Systems     Objective:   Physical Exam        Assessment & Plan:

## 2018-07-24 ENCOUNTER — Ambulatory Visit (INDEPENDENT_AMBULATORY_CARE_PROVIDER_SITE_OTHER)
Admission: RE | Admit: 2018-07-24 | Discharge: 2018-07-24 | Disposition: A | Discharge status: Home / Self Care | Payer: 59 | Source: Ambulatory Visit

## 2018-07-24 DIAGNOSIS — L03113 Cellulitis of right upper limb: Secondary | ICD-10-CM | POA: Diagnosis not present

## 2018-07-24 MED ORDER — CEPHALEXIN 500 MG PO CAPS: 500.0000 mg | ORAL_CAPSULE | Freq: Four times a day (QID) | ORAL | 0 refills | Status: AC

## 2018-07-24 NOTE — Discharge Instructions (Addendum)
Keflex 4 times daily x 1 week Tylenol and Ibuprofen for pain and swelling Follow up if redness not improving with antibiotic

## 2018-07-24 NOTE — ED Provider Notes (Signed)
Virtual Visit via Video Note:  Scherrie Gerlach  initiated request for Telemedicine visit with [facility] team. I connected with Scherrie Gerlach  on 07/24/2018 at 10:59 AM  for a synchronized telemedicine visit using a video enabled HIPPA compliant telemedicine application. I verified that I am speaking with Scherrie Gerlach  using two identifiers. Dia Donate C Yaresly Menzel, PA-C  was physically located in a [facility] Urgent care site and NATONYA FINSTAD was located at a different location.   The limitations of evaluation and management by telemedicine as well as the availability of in-person appointments were discussed. Patient was informed that she  may incur a bill ( including co-pay) for this virtual visit encounter. Thalia Turkington Mitschke  expressed understanding and gave verbal consent to proceed with virtual visit.     History of Present Illness:[name]  is a 63 y.o. female presents with evaluation for possible cellulitis.  Patient states that over the past 3 to 4 days she has developed a red spot to her left wrist.  Since in the past 24 hours she has noticed this redness spreading up her arm and streaking.  She has had associated warmth, tenderness and pain with it.  States that it feels hot.  States that it feels very similar to when she is previously had cellulitis on her face.  She has not been taking anything for the pain.  She denies any fevers.  Denies difficulty moving wrist or elbow.  Denies numbness or tingling.  Past Medical History:  Diagnosis Date  . Arthritis   . Depression   . Gallstones 01/07/2018  . Ganglion cyst of dorsum of right wrist   . Headache   . Osteoporosis   . Pneumonia 2009  . Prosthetic eye globe     No Known Allergies      Observations/Objective: Physical Exam  Constitutional: She is oriented to person, place, and time and well-developed, well-nourished, and in no distress. No distress.  HENT:  Head: Normocephalic and atraumatic.  Eyes:   Wearing glasses  Neck: Normal range of motion. Neck supple.  Pulmonary/Chest: Effort normal. No respiratory distress.  Speaking in full sentences  Musculoskeletal:     Comments: Full active range of motion of wrist and elbow  Neurological: She is alert and oriented to person, place, and time.  Face symmetric, speech clear  Skin:  Left wrist with area that does appear more erythematous, patient attempts to point out streaking extending to elbow, but due to video quality, unable to directly visualized extent    Assessment and Plan:  Patient likely with cellulitis, will treat with Keflex 4 times daily for the next week.  Advised to take Tylenol and ibuprofen for pain.  Continue to monitor extent of redness, patient is to follow-up in person if she develops any worsening redness, pain or swelling to this area or developing any decreased range of motion of surrounding joints or fevers.Discussed strict return precautions. Patient verbalized understanding and is agreeable with plan.  Follow Up Instructions:    I discussed the assessment and treatment plan with the patient. The patient was provided an opportunity to ask questions and all were answered. The patient agreed with the plan and demonstrated an understanding of the instructions.   The patient was advised to call back or seek an in-person evaluation if the symptoms worsen or if the condition fails to improve as anticipated.      Janith Lima, PA-C  07/24/2018 10:59 AM  Janith Lima, [place] 07/24/18 1959

## 2018-08-01 MED FILL — HYDROCODON-APAP 10-325: 10-325 | 30 days supply | Qty: 120 | Fill #0

## 2018-08-01 MED FILL — ADDERALL XR 20 MG CAP SA: 20 | 30 days supply | Qty: 30 | Fill #0

## 2018-08-02 MED FILL — CELECOXIB 200 MG CAP: 200 | 90 days supply | Qty: 180 | Fill #7

## 2018-08-03 ENCOUNTER — Other Ambulatory Visit: Payer: Self-pay | Admitting: Family Medicine

## 2018-08-26 MED FILL — valACYclovir HCL 1 GM TABS: 1 | 90 days supply | Qty: 90 | Fill #0

## 2018-08-26 MED FILL — GABAPENTIN 300 MG CAPSULE: 300 | 90 days supply | Qty: 270 | Fill #1

## 2018-08-31 ENCOUNTER — Other Ambulatory Visit: Payer: Self-pay | Admitting: Family Medicine

## 2018-08-31 MED FILL — HYDROCODON-APAP 10-325: 10-325 | 30 days supply | Qty: 120 | Fill #0

## 2018-08-31 MED FILL — ADDERALL XR 20 MG CAP SA: 20 | 30 days supply | Qty: 30 | Fill #0

## 2018-09-28 ENCOUNTER — Other Ambulatory Visit: Payer: Self-pay | Admitting: Family Medicine

## 2018-09-28 DIAGNOSIS — Z1231 Encounter for screening mammogram for malignant neoplasm of breast: Secondary | ICD-10-CM

## 2018-09-29 ENCOUNTER — Other Ambulatory Visit: Payer: Self-pay | Admitting: Family Medicine

## 2018-09-29 MED FILL — HYDROCODON-APAP 10-325: 10-325 | 30 days supply | Qty: 120 | Fill #0

## 2018-09-30 MED ORDER — AMPHETAMINE-DEXTROAMPHET ER 20 MG PO CP24: 20.0000 mg | ORAL_CAPSULE | Freq: Every morning | ORAL | 0 refills | Status: DC

## 2018-09-30 MED FILL — ADDERALL XR 20 MG CAP SA: 20 | 30 days supply | Qty: 30 | Fill #0

## 2018-09-30 NOTE — Telephone Encounter (Signed)
Last filled 08/31/2018 Last OV 07/20/2018  Ok to fill?

## 2018-09-30 NOTE — Telephone Encounter (Signed)
Last filled 08/31/2018 Last OV 07/20/2018   Ok to fill?

## 2018-09-30 NOTE — Telephone Encounter (Signed)
Done

## 2018-10-22 ENCOUNTER — Other Ambulatory Visit: Payer: Self-pay

## 2018-10-22 ENCOUNTER — Ambulatory Visit (INDEPENDENT_AMBULATORY_CARE_PROVIDER_SITE_OTHER): Payer: 59

## 2018-10-22 DIAGNOSIS — Z23 Encounter for immunization: Secondary | ICD-10-CM

## 2018-10-24 ENCOUNTER — Encounter: Payer: Self-pay | Admitting: Family Medicine

## 2018-10-24 ENCOUNTER — Telehealth (INDEPENDENT_AMBULATORY_CARE_PROVIDER_SITE_OTHER): Payer: 59 | Admitting: Family Medicine

## 2018-10-24 DIAGNOSIS — F1190 Opioid use, unspecified, uncomplicated: Secondary | ICD-10-CM | POA: Diagnosis not present

## 2018-10-24 DIAGNOSIS — M2550 Pain in unspecified joint: Secondary | ICD-10-CM

## 2018-10-24 MED ORDER — HYDROCODONE-ACETAMINOPHEN 10-325 MG PO TABS: 1.0000 | ORAL_TABLET | Freq: Four times a day (QID) | ORAL | 0 refills | Status: DC | PRN

## 2018-10-24 MED ORDER — CELECOXIB 200 MG PO CAPS: 200.0000 mg | ORAL_CAPSULE | Freq: Two times a day (BID) | ORAL | 11 refills | Status: DC

## 2018-10-24 MED ORDER — HYDROCODONE-ACETAMINOPHEN 10-325 MG PO TABS: 1.0000 | ORAL_TABLET | Freq: Four times a day (QID) | ORAL | 0 refills | Status: AC | PRN

## 2018-10-24 MED FILL — CELECOXIB 200 MG CAP: 200 | 30 days supply | Qty: 60 | Fill #0

## 2018-10-24 NOTE — Progress Notes (Signed)
Virtual Visit via Video Note  I connected with the patient on 10/24/18 at  4:15 PM EDT by a video enabled telemedicine application and verified that I am speaking with the correct person using two identifiers.  Location patient: home Location provider:work or home office Persons participating in the virtual visit: patient, provider  I discussed the limitations of evaluation and management by telemedicine and the availability of in person appointments. The patient expressed understanding and agreed to proceed.   HPI: Here for pain management. Her arthritis is general is stable but she is having more pain then ever in the left shoulder. She saw Dr. Dorna Leitz for this over a year ago, and he has diagnosed a complete rotator cuff tear. She has had PT and has received 3 cortisone shots in the shoulder with little response. She cannot raise the arm above her head and she tends to move the left arm as little as possible.  Indication for chronic opioid: osteoarthritis Medication and dose: Norco 10-325  # pills per month: 120 Last UDS date: 02-01-17 Opioid Treatment Agreement signed (Y/N): 04-30-17 Opioid Treatment Agreement last reviewed with patient:  10-24-18 NCCSRS reviewed this encounter (include red flags): Yes     ROS: See pertinent positives and negatives per HPI.  Past Medical History:  Diagnosis Date  . Arthritis   . Depression   . Gallstones 01/07/2018  . Ganglion cyst of dorsum of right wrist   . Headache   . Osteoporosis   . Pneumonia 2009  . Prosthetic eye globe     Past Surgical History:  Procedure Laterality Date  . ANTERIOR CERVICAL DECOMP/DISCECTOMY FUSION N/A 12/12/2014   Procedure: ANTERIOR CERVICAL DECOMPRESSION/DISCECTOMY FUSION 3 LEVELS;  Surgeon: Phylliss Bob, MD;  Location: [place];  Service: Orthopedics;  Laterality: N/A;  Anterior cervical decompression fusion, cerivcal 5-6, cervical 6-7, cervical 7-thoracic 1 with instrumentation and allografrt  . APPENDECTOMY   1974  . CHOLECYSTECTOMY N/A 01/07/2018   Procedure: LAPAROSCOPIC CHOLECYSTECTOMY WITH INTRAOPERATIVE CHOLANGIOGRAM;  Surgeon: Fanny Skates, MD;  Location: [place];  Service: General;  Laterality: N/A;  . COLONOSCOPY  04/14/2013   per Dr. Ardis Hughs, clear, repeat in 10 yrs   . CORNEAL TRANSPLANT Right 2007   x4  . ENUCLEATION Right 2013   eye infection  . GANGLION CYST EXCISION Right 05/25/2016   Procedure: RIGHT WRIST ULNAR GANGLION EXCISION;  Surgeon: Milly Jakob, MD;  Location: [place];  Service: Orthopedics;  Laterality: Right;  . RHINOPLASTY  1980  . TONSILLECTOMY     removed in 3rd grade    Family History  Problem Relation Age of Onset  . Breast cancer Maternal Aunt   . Colon cancer Neg Hx      Current Outpatient Medications:  .  acetaminophen (TYLENOL) 500 MG tablet, Take 500 mg by mouth every 6 (six) hours as needed., Disp: , Rfl:  .  [START ON 11/30/2018] amphetamine-dextroamphetamine (ADDERALL XR) 20 MG 24 hr capsule, Take 1 capsule (20 mg total) by mouth every morning., Disp: 30 capsule, Rfl: 0 .  B Complex-C (SUPER B COMPLEX PO), Take 1 tablet by mouth daily., Disp: , Rfl:  .  Calcium Carbonate-Vitamin D (CALCIUM 600+D) 600-400 MG-UNIT tablet, Take 2 tablets by mouth daily., Disp: , Rfl:  .  Carboxymethylcellul-Glycerin (REFRESH OPTIVE PF OP), Apply 1 drop to eye daily as needed (dry eyes)., Disp: , Rfl:  .  celecoxib (CELEBREX) 200 MG capsule, Take 1 capsule (200 mg total) by mouth 2 (two) times daily., Disp:  60 capsule, Rfl: 11 .  Diclofenac Sodium (PENNSAID) 2 % SOLN, Place 1 application onto the skin 2 (two) times daily., Disp: 1 Bottle, Rfl: 3 .  gabapentin (NEURONTIN) 300 MG capsule, Take 1 capsule (300 mg total) by mouth 3 (three) times daily., Disp: 270 capsule, Rfl: 1 .  [START ON 12/24/2018] HYDROcodone-acetaminophen (NORCO) 10-325 MG tablet, Take 1 tablet by mouth every 6 (six) hours as needed for moderate pain., Disp: 120 tablet, Rfl: 0 .   mometasone (NASONEX) 50 MCG/ACT nasal spray, INSTILL 2 SPRAYS INTO THE NOSE DAILY AS NEEDED FOR ALLERGIES, Disp: 17 g, Rfl: 11 .  polyethylene glycol powder (GLYCOLAX/MIRALAX) powder, TAKE 17 GRAMS BY MOUTH 2 TIMES DAILY AS NEEDED. (Patient taking differently: Take 0.5 Containers by mouth daily as needed for moderate constipation. ), Disp: 3162 g, Rfl: 1 .  valACYclovir (VALTREX) 1000 MG tablet, Take 1,000 mg by mouth daily. , Disp: , Rfl:   EXAM:  VITALS per patient if applicable:  GENERAL: alert, oriented, appears well and in no acute distress  HEENT: atraumatic, conjunttiva clear, no obvious abnormalities on inspection of external nose and ears  NECK: normal movements of the head and neck  LUNGS: on inspection no signs of respiratory distress, breathing rate appears normal, no obvious gross SOB, gasping or wheezing  CV: no obvious cyanosis  MS: moves all visible extremities without noticeable abnormality  PSYCH/NEURO: pleasant and cooperative, no obvious depression or anxiety, speech and thought processing grossly intact  ASSESSMENT AND PLAN: Pain management, meds were refilled. I urged her to follow up with Dr. Berenice Primas for the left shoulder asap before she loses any more ROM.  [name], MD  Discussed the following assessment and plan:  No diagnosis found.     I discussed the assessment and treatment plan with the patient. The patient was provided an opportunity to ask questions and all were answered. The patient agreed with the plan and demonstrated an understanding of the instructions.   The patient was advised to call back or seek an in-person evaluation if the symptoms worsen or if the condition fails to improve as anticipated.

## 2018-10-25 ENCOUNTER — Ambulatory Visit: Payer: 59 | Admitting: Family Medicine

## 2018-10-25 ENCOUNTER — Other Ambulatory Visit: Payer: Self-pay

## 2018-10-27 MED FILL — HYDROCODON-APAP 10-325: 10-325 | 30 days supply | Qty: 120 | Fill #0

## 2018-10-31 MED FILL — ADDERALL XR 20 MG CAP SA: 20 | 30 days supply | Qty: 30 | Fill #0

## 2018-11-15 ENCOUNTER — Ambulatory Visit
Admission: RE | Admit: 2018-11-15 | Discharge: 2018-11-15 | Disposition: A | Discharge status: Home / Self Care | Payer: 59 | Source: Ambulatory Visit | Attending: Family Medicine | Admitting: Family Medicine

## 2018-11-15 ENCOUNTER — Other Ambulatory Visit: Payer: Self-pay

## 2018-11-15 DIAGNOSIS — Z1231 Encounter for screening mammogram for malignant neoplasm of breast: Secondary | ICD-10-CM

## 2018-11-16 DIAGNOSIS — H04122 Dry eye syndrome of left lacrimal gland: Secondary | ICD-10-CM | POA: Diagnosis not present

## 2018-11-16 DIAGNOSIS — H2512 Age-related nuclear cataract, left eye: Secondary | ICD-10-CM | POA: Diagnosis not present

## 2018-11-16 DIAGNOSIS — Z970 Presence of artificial eye: Secondary | ICD-10-CM | POA: Diagnosis not present

## 2018-11-27 HISTORY — PX: CATARACT EXTRACTION: SUR2

## 2018-12-01 ENCOUNTER — Other Ambulatory Visit: Payer: Self-pay | Admitting: Family Medicine

## 2018-12-01 MED FILL — ADDERALL XR 20 MG CAP SA: 20 | 30 days supply | Qty: 30 | Fill #0

## 2018-12-01 MED FILL — HYDROCODON-APAP 10-325: 10-325 | 30 days supply | Qty: 120 | Fill #0

## 2018-12-01 NOTE — Telephone Encounter (Signed)
Forwarding to PCP for approval  

## 2018-12-02 MED ORDER — AMPHETAMINE-DEXTROAMPHET ER 20 MG PO CP24: 20.0000 mg | ORAL_CAPSULE | Freq: Every morning | ORAL | 0 refills | Status: DC

## 2018-12-02 MED FILL — GABAPENTIN 300 MG CAPSULE: 300 | 90 days supply | Qty: 270 | Fill #0

## 2018-12-02 NOTE — Telephone Encounter (Signed)
Done

## 2018-12-12 MED FILL — valACYclovir HCL 1 GM TABS: 1 | 30 days supply | Qty: 30 | Fill #1

## 2018-12-13 DIAGNOSIS — Z20828 Contact with and (suspected) exposure to other viral communicable diseases: Secondary | ICD-10-CM | POA: Diagnosis not present

## 2018-12-13 DIAGNOSIS — H2512 Age-related nuclear cataract, left eye: Secondary | ICD-10-CM | POA: Diagnosis not present

## 2018-12-13 DIAGNOSIS — Z01812 Encounter for preprocedural laboratory examination: Secondary | ICD-10-CM | POA: Diagnosis not present

## 2018-12-13 DIAGNOSIS — Z01818 Encounter for other preprocedural examination: Secondary | ICD-10-CM | POA: Diagnosis not present

## 2018-12-20 DIAGNOSIS — H25812 Combined forms of age-related cataract, left eye: Secondary | ICD-10-CM | POA: Diagnosis not present

## 2019-01-02 MED FILL — HYDROCODON-APAP 10-325: 10-325 | 30 days supply | Qty: 120 | Fill #0

## 2019-01-02 MED FILL — ADDERALL XR 20 MG CAP SA: 20 | 30 days supply | Qty: 30 | Fill #0

## 2019-01-27 MED FILL — valACYclovir HCL 1 GM TABS: 1 | 90 days supply | Qty: 90 | Fill #2

## 2019-01-30 ENCOUNTER — Other Ambulatory Visit: Payer: Self-pay

## 2019-01-30 ENCOUNTER — Telehealth: Payer: 59 | Admitting: Family Medicine

## 2019-01-30 ENCOUNTER — Encounter: Payer: Self-pay | Admitting: Family Medicine

## 2019-01-31 ENCOUNTER — Other Ambulatory Visit: Payer: Self-pay

## 2019-01-31 ENCOUNTER — Telehealth (INDEPENDENT_AMBULATORY_CARE_PROVIDER_SITE_OTHER): Payer: 59 | Admitting: Family Medicine

## 2019-01-31 DIAGNOSIS — M2550 Pain in unspecified joint: Secondary | ICD-10-CM

## 2019-01-31 DIAGNOSIS — F1190 Opioid use, unspecified, uncomplicated: Secondary | ICD-10-CM | POA: Diagnosis not present

## 2019-01-31 MED ORDER — OXYCODONE HCL 10 MG PO TABS: 10.0000 mg | ORAL_TABLET | Freq: Four times a day (QID) | ORAL | 0 refills | Status: DC | PRN

## 2019-01-31 MED FILL — oxyCODONE HCL 10 MG TABS: 10 | 30 days supply | Qty: 120 | Fill #0

## 2019-01-31 NOTE — Progress Notes (Signed)
Virtual Visit via Video Note  I connected with the patient on 01/31/19 at  3:30 PM EST by a video enabled telemedicine application and verified that I am speaking with the correct person using two identifiers.  Location patient: home Location provider:work or home office Persons participating in the virtual visit: patient, provider  I discussed the limitations of evaluation and management by telemedicine and the availability of in person appointments. The patient expressed understanding and agreed to proceed.   HPI Here for pain management. She has been struggling with worsening pain in both shoulders. The Norco no longer helps much. She still takes Celebrex and Gabapentin.  Indication for chronic opioid: osteoarthritis  Medication and dose: Norco 10-325  # pills per month: 120 Last UDS date: 02-01-17 Opioid Treatment Agreement signed (Y/N): 04-30-17 Opioid Treatment Agreement last reviewed with patient:  01-31-19 [facility] reviewed this encounter (include red flags): Yes     ROS: See pertinent positives and negatives per HPI.  Past Medical History:  Diagnosis Date  . Arthritis   . Depression   . Gallstones 01/07/2018  . Ganglion cyst of dorsum of right wrist   . Headache   . Osteoporosis   . Pneumonia 2009  . Prosthetic eye globe     Past Surgical History:  Procedure Laterality Date  . ANTERIOR CERVICAL DECOMP/DISCECTOMY FUSION N/A 12/12/2014   Procedure: ANTERIOR CERVICAL DECOMPRESSION/DISCECTOMY FUSION 3 LEVELS;  Surgeon: Phylliss Bob, MD;  Location: [facility];  Service: Orthopedics;  Laterality: N/A;  Anterior cervical decompression fusion, cerivcal 5-6, cervical 6-7, cervical 7-thoracic 1 with instrumentation and allografrt  . APPENDECTOMY  1974  . CHOLECYSTECTOMY N/A 01/07/2018   Procedure: LAPAROSCOPIC CHOLECYSTECTOMY WITH INTRAOPERATIVE CHOLANGIOGRAM;  Surgeon: Fanny Skates, MD;  Location: [facility];  Service: General;  Laterality: N/A;  . COLONOSCOPY  04/14/2013   per Dr.  Ardis Hughs, clear, repeat in 10 yrs   . CORNEAL TRANSPLANT Right 2007   x4  . ENUCLEATION Right 2013   eye infection  . GANGLION CYST EXCISION Right 05/25/2016   Procedure: RIGHT WRIST ULNAR GANGLION EXCISION;  Surgeon: Milly Jakob, MD;  Location: [facility];  Service: Orthopedics;  Laterality: Right;  . RHINOPLASTY  1980  . TONSILLECTOMY     removed in 3rd grade    Family History  Problem Relation Age of Onset  . Breast cancer Maternal Aunt   . Colon cancer Neg Hx      Current Outpatient Medications:  .  acetaminophen (TYLENOL) 500 MG tablet, Take 500 mg by mouth every 6 (six) hours as needed., Disp: , Rfl:  .  [START ON 02/01/2019] amphetamine-dextroamphetamine (ADDERALL XR) 20 MG 24 hr capsule, Take 1 capsule (20 mg total) by mouth every morning., Disp: 30 capsule, Rfl: 0 .  B Complex-C (SUPER B COMPLEX PO), Take 1 tablet by mouth daily., Disp: , Rfl:  .  Calcium Carbonate-Vitamin D (CALCIUM 600+D) 600-400 MG-UNIT tablet, Take 2 tablets by mouth daily., Disp: , Rfl:  .  Carboxymethylcellul-Glycerin (REFRESH OPTIVE PF OP), Apply 1 drop to eye daily as needed (dry eyes)., Disp: , Rfl:  .  celecoxib (CELEBREX) 200 MG capsule, Take 1 capsule (200 mg total) by mouth 2 (two) times daily., Disp: 60 capsule, Rfl: 11 .  Diclofenac Sodium (PENNSAID) 2 % SOLN, Place 1 application onto the skin 2 (two) times daily., Disp: 1 Bottle, Rfl: 3 .  gabapentin (NEURONTIN) 300 MG capsule, TAKE 1 CAPSULE (300 MG TOTAL) BY MOUTH 3 TIMES DAILY., Disp: 270 capsule, Rfl: 1 .  mometasone (NASONEX) 50 MCG/ACT nasal spray, INSTILL 2 SPRAYS INTO THE NOSE DAILY AS NEEDED FOR ALLERGIES, Disp: 17 g, Rfl: 11 .  [START ON 03/31/2019] Oxycodone HCl 10 MG TABS, Take 1 tablet (10 mg total) by mouth every 6 (six) hours as needed (pain)., Disp: 120 tablet, Rfl: 0 .  polyethylene glycol powder (GLYCOLAX/MIRALAX) powder, TAKE 17 GRAMS BY MOUTH 2 TIMES DAILY AS NEEDED. (Patient taking differently: Take 0.5 Containers  by mouth daily as needed for moderate constipation. ), Disp: 3162 g, Rfl: 1 .  valACYclovir (VALTREX) 1000 MG tablet, Take 1,000 mg by mouth daily. , Disp: , Rfl:   EXAM:  VITALS per patient if applicable:  GENERAL: alert, oriented, appears well and in no acute distress  HEENT: atraumatic, conjunttiva clear, no obvious abnormalities on inspection of external nose and ears  NECK: normal movements of the head and neck  LUNGS: on inspection no signs of respiratory distress, breathing rate appears normal, no obvious gross SOB, gasping or wheezing  CV: no obvious cyanosis  MS: moves all visible extremities without noticeable abnormality  PSYCH/NEURO: pleasant and cooperative, no obvious depression or anxiety, speech and thought processing grossly intact  ASSESSMENT AND PLAN: Pain management. We will stop Norco and let her try Oxycodone 10 mg as needed. She will see [place] on 02-10-19 for a well exam and we can review her pain and get another UDS that day.  [name], MD  Discussed the following assessment and plan:  No diagnosis found.     I discussed the assessment and treatment plan with the patient. The patient was provided an opportunity to ask questions and all were answered. The patient agreed with the plan and demonstrated an understanding of the instructions.   The patient was advised to call back or seek an in-person evaluation if the symptoms worsen or if the condition fails to improve as anticipated.

## 2019-02-01 MED FILL — ADDERALL XR 20 MG CAP SA: 20 | 30 days supply | Qty: 30 | Fill #0

## 2019-02-10 ENCOUNTER — Other Ambulatory Visit: Payer: Self-pay

## 2019-02-10 ENCOUNTER — Ambulatory Visit (INDEPENDENT_AMBULATORY_CARE_PROVIDER_SITE_OTHER): Payer: 59 | Admitting: Family Medicine

## 2019-02-10 ENCOUNTER — Encounter: Payer: Self-pay | Admitting: Family Medicine

## 2019-02-10 VITALS — BP 140/80 | HR 75 | Temp 97.4°F | Wt 105.2 lb

## 2019-02-10 DIAGNOSIS — Z0000 Encounter for general adult medical examination without abnormal findings: Secondary | ICD-10-CM

## 2019-02-10 DIAGNOSIS — F1190 Opioid use, unspecified, uncomplicated: Secondary | ICD-10-CM

## 2019-02-10 DIAGNOSIS — I7300 Raynaud's syndrome without gangrene: Secondary | ICD-10-CM | POA: Diagnosis not present

## 2019-02-10 LAB — BASIC METABOLIC PANEL
BUN: 12 mg/dL (ref 6–23)
CO2: 30 mEq/L (ref 19–32)
Calcium: 9.5 mg/dL (ref 8.4–10.5)
Chloride: 103 mEq/L (ref 96–112)
Creatinine, Ser: 0.77 mg/dL (ref 0.40–1.20)
GFR: 76.15 mL/min (ref >60.00)
Glucose, Bld: 110 mg/dL — ABNORMAL HIGH (ref 70–99)
Potassium: 4.5 mEq/L (ref 3.5–5.1)
Sodium: 138 mEq/L (ref 135–145)

## 2019-02-10 LAB — CBC WITH DIFFERENTIAL/PLATELET
Basophils Absolute: 0 10*3/uL (ref 0.0–0.1)
Basophils Relative: 0.6 % (ref 0.0–3.0)
Eosinophils Absolute: 0.1 10*3/uL (ref 0.0–0.7)
Eosinophils Relative: 1.6 % (ref 0.0–5.0)
HCT: 40.4 % (ref 36.0–46.0)
Hemoglobin: 13.6 g/dL (ref 12.0–15.0)
Lymphocytes Relative: 43.5 % (ref 12.0–46.0)
Lymphs Abs: 1.5 10*3/uL (ref 0.7–4.0)
MCHC: 33.8 g/dL (ref 30.0–36.0)
MCV: 101 fl — ABNORMAL HIGH (ref 78.0–100.0)
Monocytes Absolute: 0.4 10*3/uL (ref 0.1–1.0)
Monocytes Relative: 10.6 % (ref 3.0–12.0)
Neutro Abs: 1.5 10*3/uL (ref 1.4–7.7)
Neutrophils Relative %: 43.7 % (ref 43.0–77.0)
Platelets: 140 10*3/uL — ABNORMAL LOW (ref 150.0–400.0)
RBC: 4 Mil/uL (ref 3.87–5.11)
RDW: 12.2 % (ref 11.5–15.5)
WBC: 3.4 10*3/uL — ABNORMAL LOW (ref 4.0–10.5)

## 2019-02-10 LAB — LIPID PANEL
Cholesterol: 221 mg/dL — ABNORMAL HIGH (ref 0–200)
HDL: 59.3 mg/dL (ref >39.00)
LDL Cholesterol: 144 mg/dL — ABNORMAL HIGH (ref 0–99)
NonHDL: 162.09
Total CHOL/HDL Ratio: 4
Triglycerides: 90 mg/dL (ref 0.0–149.0)
VLDL: 18 mg/dL (ref 0.0–40.0)

## 2019-02-10 LAB — HEPATIC FUNCTION PANEL
ALT: 19 U/L (ref 0–35)
AST: 26 U/L (ref 0–37)
Albumin: 4.7 g/dL (ref 3.5–5.2)
Alkaline Phosphatase: 37 U/L — ABNORMAL LOW (ref 39–117)
Bilirubin, Direct: 0.1 mg/dL (ref 0.0–0.3)
Total Bilirubin: 0.7 mg/dL (ref 0.2–1.2)
Total Protein: 7.4 g/dL (ref 6.0–8.3)

## 2019-02-10 LAB — TSH: TSH: 2.05 u[IU]/mL (ref 0.35–4.50)

## 2019-02-10 MED ORDER — VERAPAMIL HCL ER 120 MG PO TBCR: 120.0000 mg | EXTENDED_RELEASE_TABLET | Freq: Every day | ORAL | 5 refills | Status: DC

## 2019-02-10 MED ORDER — DICLOFENAC SODIUM 2 % EX SOLN: 1.0000 "application " | Freq: Four times a day (QID) | CUTANEOUS | 11 refills | Status: DC

## 2019-02-10 MED FILL — VERAPAMIL ER 120 MG TABLET: 120 | 30 days supply | Qty: 30 | Fill #0

## 2019-02-10 NOTE — Progress Notes (Signed)
Subjective:    Patient ID: [name], female    DOB: 1955/04/22, 64 y.o.   MRN: [id number]  HPI Here for a well exam. She still struggles with daily pain, but we recently switched from hydrocodone to oxycodone, and this has helped a great deal. She is scheduled to see Dr. Tamera Punt at [facility] next week for her bilateral shoulder pain. Her main complaint today is frequent bouts of pain in both hands, especially when they get cold. She does better in the summertime, but in winter they make her miserable. The fingers turn white or purple and they feel cold. They get an aching pain. She often wears gloves to help with this. This has ben going on about 2 years.    Review of Systems  Constitutional: Negative.   HENT: Negative.   Eyes: Negative.   Respiratory: Negative.   Cardiovascular: Negative.   Gastrointestinal: Negative.   Genitourinary: Negative for decreased urine volume, difficulty urinating, dyspareunia, dysuria, enuresis, flank pain, frequency, hematuria, pelvic pain and urgency.  Musculoskeletal: Positive for arthralgias.  Skin: Positive for color change.  Neurological: Negative.   Psychiatric/Behavioral: Negative.        Objective:   Physical Exam Constitutional:      General: She is not in acute distress.    Appearance: Normal appearance. She is well-developed. She is not diaphoretic.  HENT:     Head: Normocephalic and atraumatic.     Right Ear: External ear normal.     Left Ear: External ear normal.     Nose: Nose normal.     Mouth/Throat:     Pharynx: No oropharyngeal exudate.  Eyes:     General: No scleral icterus.       Right eye: No discharge.        Left eye: No discharge.     Conjunctiva/sclera: Conjunctivae normal.     Pupils: Pupils are equal, round, and reactive to light.  Neck:     Thyroid: No thyromegaly.     Vascular: No JVD.  Cardiovascular:     Rate and Rhythm: Normal rate and regular rhythm.     Heart sounds: Normal heart  sounds. No murmur. No friction rub. No gallop.   Pulmonary:     Effort: Pulmonary effort is normal. No respiratory distress.     Breath sounds: Normal breath sounds. No stridor. No wheezing or rales.  Chest:     Chest wall: No tenderness.  Abdominal:     General: Bowel sounds are normal. There is no distension or abdominal bruit.     Palpations: Abdomen is soft. Abdomen is not rigid. There is no mass.     Tenderness: There is no abdominal tenderness. There is no guarding or rebound.     Hernia: No hernia is present.  Genitourinary:    Vagina: Normal. No vaginal discharge, erythema, tenderness or bleeding.     Cervix: No cervical motion tenderness, discharge or friability.     Adnexa:        Right: No mass, tenderness or fullness.         Left: No mass, tenderness or fullness.       Rectum: Normal.  Musculoskeletal:        General: No tenderness. Normal range of motion.     Cervical back: Normal range of motion and neck supple.  Lymphadenopathy:     Cervical: No cervical adenopathy.  Skin:    General: Skin is warm and dry.     Coloration:  Skin is not pale.     Findings: No erythema or rash.     Comments: Both hands are cool to the touch, and the skin has a purple color. The rest of her skin is warm and pink.   Neurological:     Mental Status: She is alert.     Cranial Nerves: No cranial nerve deficit.     Motor: No abnormal muscle tone.     Coordination: Coordination normal.     Deep Tendon Reflexes: Reflexes are normal and symmetric.  Psychiatric:        Behavior: Behavior normal.        Thought Content: Thought content normal.        Judgment: Judgment normal.           Assessment & Plan:  Well exam. We discussed diet and exercise. Get fasting labs. 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 She will try Verapamil ER 120 mg daily and recheck in 3-4 weeks.  [name], MD

## 2019-02-12 LAB — PAIN MGMT, PROFILE 8 W/CONF, U
6 Acetylmorphine: NEGATIVE ng/mL
Alcohol Metabolites: NEGATIVE ng/mL (ref <500)
Amphetamine: 843 ng/mL
Amphetamines: POSITIVE ng/mL
Benzodiazepines: NEGATIVE ng/mL
Buprenorphine, Urine: NEGATIVE ng/mL
Cocaine Metabolite: NEGATIVE ng/mL
Codeine: NEGATIVE ng/mL
Creatinine: 17 mg/dL
Hydrocodone: NEGATIVE ng/mL
Hydromorphone: NEGATIVE ng/mL
MDMA: NEGATIVE ng/mL
Marijuana Metabolite: NEGATIVE ng/mL
Methamphetamine: NEGATIVE ng/mL
Morphine: NEGATIVE ng/mL
Norhydrocodone: NEGATIVE ng/mL
Noroxycodone: 1717 ng/mL
Opiates: NEGATIVE ng/mL
Oxidant: NEGATIVE ug/mL
Oxycodone: 1115 ng/mL
Oxycodone: POSITIVE ng/mL
Oxymorphone: 382 ng/mL
Specific Gravity: 1.003 (ref >=1.0)
pH: 7.1 (ref 4.5–9.0)

## 2019-02-17 ENCOUNTER — Encounter: Payer: Self-pay | Admitting: Family Medicine

## 2019-02-17 DIAGNOSIS — M19012 Primary osteoarthritis, left shoulder: Secondary | ICD-10-CM | POA: Diagnosis not present

## 2019-02-17 DIAGNOSIS — M67911 Unspecified disorder of synovium and tendon, right shoulder: Secondary | ICD-10-CM | POA: Diagnosis not present

## 2019-02-19 ENCOUNTER — Encounter: Payer: Self-pay | Admitting: Family Medicine

## 2019-02-20 ENCOUNTER — Other Ambulatory Visit: Payer: Self-pay | Admitting: Orthopedic Surgery

## 2019-02-22 ENCOUNTER — Other Ambulatory Visit: Payer: Self-pay

## 2019-02-22 ENCOUNTER — Encounter (HOSPITAL_BASED_OUTPATIENT_CLINIC_OR_DEPARTMENT_OTHER): Payer: Self-pay | Admitting: Orthopedic Surgery

## 2019-02-22 NOTE — Progress Notes (Signed)
PCP - Laurey Morale, MD Cardiologist - n/a  Chest x-ray - ordered EKG - ordered Stress Test - n/a ECHO - 2010 epic, ef 55-60 Cardiac Cath -   Sleep Study - n/a CPAP - n/a  Fasting Blood Sugar - n/a Checks Blood Sugar _____ times a day  Blood Thinner Instructions:n/a Aspirin Instructions: Last Dose:  Anesthesia review:  None indicated   Patient denies shortness of breath, fever, cough and chest pain at PAT appointment   Patient verbalized understanding of instructions that were given to them at the PAT appointment. Patient was also instructed that they will need to review over the PAT instructions again at home before surgery.

## 2019-02-24 ENCOUNTER — Other Ambulatory Visit: Payer: Self-pay | Admitting: Orthopedic Surgery

## 2019-02-24 DIAGNOSIS — M25512 Pain in left shoulder: Secondary | ICD-10-CM

## 2019-03-03 MED FILL — ADDERALL XR 20 MG CAP SA: 20 | 30 days supply | Qty: 30 | Fill #0

## 2019-03-03 MED FILL — oxyCODONE HCL 10 MG TABS: 10 | 30 days supply | Qty: 120 | Fill #0

## 2019-03-04 ENCOUNTER — Ambulatory Visit
Admission: RE | Admit: 2019-03-04 | Discharge: 2019-03-04 | Disposition: A | Discharge status: Home / Self Care | Payer: 59 | Source: Ambulatory Visit | Attending: Orthopedic Surgery | Admitting: Orthopedic Surgery

## 2019-03-04 ENCOUNTER — Other Ambulatory Visit: Payer: Self-pay

## 2019-03-04 DIAGNOSIS — M25512 Pain in left shoulder: Secondary | ICD-10-CM

## 2019-03-04 DIAGNOSIS — M19012 Primary osteoarthritis, left shoulder: Secondary | ICD-10-CM | POA: Diagnosis not present

## 2019-03-06 DIAGNOSIS — M25612 Stiffness of left shoulder, not elsewhere classified: Secondary | ICD-10-CM | POA: Diagnosis not present

## 2019-03-06 DIAGNOSIS — M25512 Pain in left shoulder: Secondary | ICD-10-CM | POA: Diagnosis not present

## 2019-03-06 DIAGNOSIS — M19012 Primary osteoarthritis, left shoulder: Secondary | ICD-10-CM | POA: Diagnosis not present

## 2019-03-09 ENCOUNTER — Other Ambulatory Visit: Payer: Self-pay

## 2019-03-09 ENCOUNTER — Ambulatory Visit
Admission: RE | Admit: 2019-03-09 | Discharge: 2019-03-09 | Disposition: A | Discharge status: Home / Self Care | Payer: 59 | Source: Ambulatory Visit | Attending: Orthopedic Surgery | Admitting: Orthopedic Surgery

## 2019-03-09 ENCOUNTER — Other Ambulatory Visit: Payer: Self-pay | Admitting: Orthopedic Surgery

## 2019-03-09 ENCOUNTER — Other Ambulatory Visit (HOSPITAL_COMMUNITY)
Admission: RE | Admit: 2019-03-09 | Discharge: 2019-03-09 | Disposition: A | Discharge status: Home / Self Care | Payer: 59 | Source: Ambulatory Visit | Attending: Orthopedic Surgery | Admitting: Orthopedic Surgery

## 2019-03-09 ENCOUNTER — Encounter (HOSPITAL_BASED_OUTPATIENT_CLINIC_OR_DEPARTMENT_OTHER)
Admission: RE | Admit: 2019-03-09 | Discharge: 2019-03-09 | Disposition: A | Discharge status: Home / Self Care | Payer: 59 | Source: Ambulatory Visit | Attending: Orthopedic Surgery | Admitting: Orthopedic Surgery

## 2019-03-09 DIAGNOSIS — Z20822 Contact with and (suspected) exposure to covid-19: Secondary | ICD-10-CM | POA: Insufficient documentation

## 2019-03-09 DIAGNOSIS — Z01811 Encounter for preprocedural respiratory examination: Secondary | ICD-10-CM

## 2019-03-09 DIAGNOSIS — Z01812 Encounter for preprocedural laboratory examination: Secondary | ICD-10-CM | POA: Diagnosis not present

## 2019-03-09 DIAGNOSIS — Z01818 Encounter for other preprocedural examination: Secondary | ICD-10-CM | POA: Diagnosis not present

## 2019-03-09 DIAGNOSIS — I252 Old myocardial infarction: Secondary | ICD-10-CM | POA: Diagnosis not present

## 2019-03-09 DIAGNOSIS — M19012 Primary osteoarthritis, left shoulder: Secondary | ICD-10-CM | POA: Diagnosis not present

## 2019-03-09 DIAGNOSIS — Z791 Long term (current) use of non-steroidal anti-inflammatories (NSAID): Secondary | ICD-10-CM | POA: Diagnosis not present

## 2019-03-09 DIAGNOSIS — Z8614 Personal history of Methicillin resistant Staphylococcus aureus infection: Secondary | ICD-10-CM | POA: Diagnosis not present

## 2019-03-09 DIAGNOSIS — I7300 Raynaud's syndrome without gangrene: Secondary | ICD-10-CM | POA: Diagnosis not present

## 2019-03-09 DIAGNOSIS — Z981 Arthrodesis status: Secondary | ICD-10-CM | POA: Diagnosis not present

## 2019-03-09 DIAGNOSIS — Z79899 Other long term (current) drug therapy: Secondary | ICD-10-CM | POA: Diagnosis not present

## 2019-03-09 LAB — CBC WITH DIFFERENTIAL/PLATELET
Abs Immature Granulocytes: 0.01 10*3/uL (ref 0.00–0.07)
Basophils Absolute: 0 10*3/uL (ref 0.0–0.1)
Basophils Relative: 1 %
Eosinophils Absolute: 0.1 10*3/uL (ref 0.0–0.5)
Eosinophils Relative: 2 %
HCT: 40.3 % (ref 36.0–46.0)
Hemoglobin: 13.4 g/dL (ref 12.0–15.0)
Immature Granulocytes: 0 %
Lymphocytes Relative: 44 %
Lymphs Abs: 1.5 10*3/uL (ref 0.7–4.0)
MCH: 33.4 pg (ref 26.0–34.0)
MCHC: 33.3 g/dL (ref 30.0–36.0)
MCV: 100.5 fL — ABNORMAL HIGH (ref 80.0–100.0)
Monocytes Absolute: 0.4 10*3/uL (ref 0.1–1.0)
Monocytes Relative: 11 %
Neutro Abs: 1.4 10*3/uL — ABNORMAL LOW (ref 1.7–7.7)
Neutrophils Relative %: 42 %
Platelets: 131 10*3/uL — ABNORMAL LOW (ref 150–400)
RBC: 4.01 MIL/uL (ref 3.87–5.11)
RDW: 11.9 % (ref 11.5–15.5)
WBC: 3.4 10*3/uL — ABNORMAL LOW (ref 4.0–10.5)
nRBC: 0 % (ref 0.0–0.2)

## 2019-03-09 LAB — COMPREHENSIVE METABOLIC PANEL
ALT: 28 U/L (ref 0–44)
AST: 42 U/L — ABNORMAL HIGH (ref 15–41)
Albumin: 4.5 g/dL (ref 3.5–5.0)
Alkaline Phosphatase: 36 U/L — ABNORMAL LOW (ref 38–126)
Anion gap: 9 (ref 5–15)
BUN: 12 mg/dL (ref 8–23)
CO2: 27 mmol/L (ref 22–32)
Calcium: 9 mg/dL (ref 8.9–10.3)
Chloride: 102 mmol/L (ref 98–111)
Creatinine, Ser: 0.73 mg/dL (ref 0.44–1.00)
GFR calc Af Amer: >60 mL/min (ref >60)
GFR calc non Af Amer: >60 mL/min (ref >60)
Glucose, Bld: 96 mg/dL (ref 70–99)
Potassium: 3.8 mmol/L (ref 3.5–5.1)
Sodium: 138 mmol/L (ref 135–145)
Total Bilirubin: 1.1 mg/dL (ref 0.3–1.2)
Total Protein: 7.6 g/dL (ref 6.5–8.1)

## 2019-03-09 LAB — TYPE AND SCREEN
ABO/RH(D): A NEG
Antibody Screen: NEGATIVE

## 2019-03-09 LAB — SARS CORONAVIRUS 2 (TAT 6-24 HRS): SARS Coronavirus 2: NEGATIVE

## 2019-03-09 LAB — URINALYSIS, ROUTINE W REFLEX MICROSCOPIC
Bilirubin Urine: NEGATIVE
Glucose, UA: NEGATIVE mg/dL
Hgb urine dipstick: NEGATIVE
Ketones, ur: NEGATIVE mg/dL
Leukocytes,Ua: NEGATIVE
Nitrite: NEGATIVE
Protein, ur: NEGATIVE mg/dL
Specific Gravity, Urine: 1.011 (ref 1.005–1.030)
pH: 6 (ref 5.0–8.0)

## 2019-03-09 LAB — APTT: aPTT: 23 seconds — ABNORMAL LOW (ref 24–36)

## 2019-03-09 LAB — PROTIME-INR
INR: 1 (ref 0.8–1.2)
Prothrombin Time: 13 seconds (ref 11.4–15.2)

## 2019-03-09 NOTE — Progress Notes (Signed)
CHG and Benzoyl Peroxide gel with specific instructions. Reviewed instructions together and given instruction sheet for completion as well. Pt verbalized understanding at this time.             Enhanced Recovery after Surgery for Orthopedics Enhanced Recovery after Surgery is a protocol used to improve the stress on your body and your recovery after surgery.  Patient Instructions  . The night before surgery:  o No food after midnight. ONLY clear liquids after midnight  . The day of surgery (if you do NOT have diabetes):  o Drink ONE (1) Pre-Surgery Clear Ensure as directed.   o This drink was given to you during your hospital  pre-op appointment visit. o The pre-op nurse will instruct you on the time to drink the  Pre-Surgery Ensure depending on your surgery time. o Finish the drink at the designated time by the pre-op nurse.  o Nothing else to drink after completing the  Pre-Surgery Clear Ensure.  . The day of surgery (if you have diabetes): o Drink ONE (1) Gatorade 2 (G2) as directed. o This drink was given to you during your hospital  pre-op appointment visit.  o The pre-op nurse will instruct you on the time to drink the   Gatorade 2 (G2) depending on your surgery time. o Color of the Gatorade may vary. Red is not allowed. o Nothing else to drink after completing the  Gatorade 2 (G2).         If you have questions, please contact your surgeon's office.

## 2019-03-10 DIAGNOSIS — Z8614 Personal history of Methicillin resistant Staphylococcus aureus infection: Secondary | ICD-10-CM | POA: Diagnosis not present

## 2019-03-10 DIAGNOSIS — M19012 Primary osteoarthritis, left shoulder: Secondary | ICD-10-CM | POA: Diagnosis not present

## 2019-03-10 DIAGNOSIS — Z791 Long term (current) use of non-steroidal anti-inflammatories (NSAID): Secondary | ICD-10-CM | POA: Diagnosis not present

## 2019-03-10 DIAGNOSIS — Z981 Arthrodesis status: Secondary | ICD-10-CM | POA: Diagnosis not present

## 2019-03-10 DIAGNOSIS — I252 Old myocardial infarction: Secondary | ICD-10-CM | POA: Diagnosis not present

## 2019-03-10 DIAGNOSIS — I7300 Raynaud's syndrome without gangrene: Secondary | ICD-10-CM | POA: Diagnosis not present

## 2019-03-10 DIAGNOSIS — Z79899 Other long term (current) drug therapy: Secondary | ICD-10-CM | POA: Diagnosis not present

## 2019-03-11 LAB — SURGICAL PCR SCREEN
MRSA, PCR: NEGATIVE
Staphylococcus aureus: NEGATIVE

## 2019-03-11 MED FILL — VERAPAMIL ER 120 MG TABLET: 120 | 30 days supply | Qty: 30 | Fill #1

## 2019-03-11 MED FILL — CELECOXIB 200 MG CAP: 200 | 30 days supply | Qty: 60 | Fill #1

## 2019-03-11 MED FILL — GABAPENTIN 300 MG CAPSULE: 300 | 90 days supply | Qty: 270 | Fill #1

## 2019-03-13 ENCOUNTER — Ambulatory Visit (HOSPITAL_BASED_OUTPATIENT_CLINIC_OR_DEPARTMENT_OTHER): Payer: 59 | Admitting: Anesthesiology

## 2019-03-13 ENCOUNTER — Encounter (HOSPITAL_BASED_OUTPATIENT_CLINIC_OR_DEPARTMENT_OTHER)
Admission: RE | Disposition: A | Discharge status: Home / Self Care | Payer: Self-pay | Source: Home / Self Care | Attending: Orthopedic Surgery

## 2019-03-13 ENCOUNTER — Encounter (HOSPITAL_BASED_OUTPATIENT_CLINIC_OR_DEPARTMENT_OTHER): Payer: Self-pay | Admitting: Orthopedic Surgery

## 2019-03-13 ENCOUNTER — Ambulatory Visit (HOSPITAL_BASED_OUTPATIENT_CLINIC_OR_DEPARTMENT_OTHER)
Admission: RE | Admit: 2019-03-13 | Discharge: 2019-03-13 | Disposition: A | Discharge status: Home / Self Care | Payer: 59 | Attending: Orthopedic Surgery | Admitting: Orthopedic Surgery

## 2019-03-13 ENCOUNTER — Other Ambulatory Visit: Payer: Self-pay

## 2019-03-13 DIAGNOSIS — Z01811 Encounter for preprocedural respiratory examination: Secondary | ICD-10-CM

## 2019-03-13 DIAGNOSIS — Z79899 Other long term (current) drug therapy: Secondary | ICD-10-CM | POA: Insufficient documentation

## 2019-03-13 DIAGNOSIS — M19012 Primary osteoarthritis, left shoulder: Secondary | ICD-10-CM | POA: Diagnosis not present

## 2019-03-13 DIAGNOSIS — I252 Old myocardial infarction: Secondary | ICD-10-CM | POA: Insufficient documentation

## 2019-03-13 DIAGNOSIS — Z8614 Personal history of Methicillin resistant Staphylococcus aureus infection: Secondary | ICD-10-CM | POA: Diagnosis not present

## 2019-03-13 DIAGNOSIS — G8918 Other acute postprocedural pain: Secondary | ICD-10-CM | POA: Diagnosis not present

## 2019-03-13 DIAGNOSIS — Z981 Arthrodesis status: Secondary | ICD-10-CM | POA: Diagnosis not present

## 2019-03-13 DIAGNOSIS — Z791 Long term (current) use of non-steroidal anti-inflammatories (NSAID): Secondary | ICD-10-CM | POA: Insufficient documentation

## 2019-03-13 DIAGNOSIS — J309 Allergic rhinitis, unspecified: Secondary | ICD-10-CM | POA: Diagnosis not present

## 2019-03-13 DIAGNOSIS — I7300 Raynaud's syndrome without gangrene: Secondary | ICD-10-CM | POA: Insufficient documentation

## 2019-03-13 DIAGNOSIS — F329 Major depressive disorder, single episode, unspecified: Secondary | ICD-10-CM | POA: Diagnosis not present

## 2019-03-13 HISTORY — PX: TOTAL SHOULDER ARTHROPLASTY: SHX126

## 2019-03-13 HISTORY — DX: Raynaud's syndrome without gangrene: I73.00

## 2019-03-13 SURGERY — ARTHROPLASTY, SHOULDER, TOTAL
Anesthesia: Regional | Site: Shoulder | Laterality: Left

## 2019-03-13 MED ORDER — SUGAMMADEX SODIUM 200 MG/2ML IV SOLN
INTRAVENOUS | Status: DC | PRN
  Administered 2019-03-13: 100 mg via INTRAVENOUS

## 2019-03-13 MED ORDER — DEXAMETHASONE SODIUM PHOSPHATE 10 MG/ML IJ SOLN
INTRAMUSCULAR | Status: DC | PRN
  Administered 2019-03-13: 5 mg via INTRAVENOUS

## 2019-03-13 MED ORDER — LACTATED RINGERS IV SOLN: INTRAVENOUS | Status: DC

## 2019-03-13 MED ORDER — DEXAMETHASONE SODIUM PHOSPHATE 10 MG/ML IJ SOLN
INTRAMUSCULAR | Status: AC
  Filled 2019-03-13: qty 1

## 2019-03-13 MED ORDER — MIDAZOLAM HCL 2 MG/2ML IJ SOLN
INTRAMUSCULAR | Status: AC
  Filled 2019-03-13: qty 2

## 2019-03-13 MED ORDER — TRANEXAMIC ACID-NACL 1000-0.7 MG/100ML-% IV SOLN
1000.0000 mg | INTRAVENOUS | Status: AC
  Administered 2019-03-13: 1000 mg via INTRAVENOUS

## 2019-03-13 MED ORDER — MIDAZOLAM HCL 2 MG/2ML IJ SOLN
INTRAMUSCULAR | Status: DC | PRN
  Administered 2019-03-13: 2 mg via INTRAVENOUS

## 2019-03-13 MED ORDER — ARTIFICIAL TEARS OPHTHALMIC OINT
TOPICAL_OINTMENT | OPHTHALMIC | Status: DC | PRN
  Administered 2019-03-13: 1 via OPHTHALMIC

## 2019-03-13 MED ORDER — LIDOCAINE 2% (20 MG/ML) 5 ML SYRINGE
INTRAMUSCULAR | Status: AC
  Filled 2019-03-13: qty 5

## 2019-03-13 MED ORDER — ROCURONIUM BROMIDE 100 MG/10ML IV SOLN
INTRAVENOUS | Status: DC | PRN
  Administered 2019-03-13: 50 mg via INTRAVENOUS

## 2019-03-13 MED ORDER — CEFAZOLIN SODIUM-DEXTROSE 2-4 GM/100ML-% IV SOLN
INTRAVENOUS | Status: AC
  Filled 2019-03-13: qty 100

## 2019-03-13 MED ORDER — PROPOFOL 10 MG/ML IV BOLUS
INTRAVENOUS | Status: AC
  Filled 2019-03-13: qty 20

## 2019-03-13 MED ORDER — FENTANYL CITRATE (PF) 100 MCG/2ML IJ SOLN
INTRAMUSCULAR | Status: AC
  Filled 2019-03-13: qty 2

## 2019-03-13 MED ORDER — TRANEXAMIC ACID-NACL 1000-0.7 MG/100ML-% IV SOLN
INTRAVENOUS | Status: AC
  Filled 2019-03-13: qty 100

## 2019-03-13 MED ORDER — BUPIVACAINE LIPOSOME 1.3 % IJ SUSP
INTRAMUSCULAR | Status: DC | PRN
  Administered 2019-03-13: 10 mL via PERINEURAL

## 2019-03-13 MED ORDER — FENTANYL CITRATE (PF) 100 MCG/2ML IJ SOLN: 25.0000 ug | INTRAMUSCULAR | Status: DC | PRN

## 2019-03-13 MED ORDER — ONDANSETRON HCL 4 MG/2ML IJ SOLN
INTRAMUSCULAR | Status: AC
  Filled 2019-03-13: qty 2

## 2019-03-13 MED ORDER — MIDAZOLAM HCL 2 MG/2ML IJ SOLN
1.0000 mg | INTRAMUSCULAR | Status: DC | PRN
  Administered 2019-03-13: 11:00:00 1 mg via INTRAVENOUS

## 2019-03-13 MED ORDER — GLYCOPYRROLATE PF 0.2 MG/ML IJ SOSY
PREFILLED_SYRINGE | INTRAMUSCULAR | Status: AC
  Filled 2019-03-13: qty 1

## 2019-03-13 MED ORDER — TIZANIDINE HCL 4 MG PO TABS: 4.0000 mg | ORAL_TABLET | Freq: Three times a day (TID) | ORAL | 1 refills | Status: DC | PRN

## 2019-03-13 MED ORDER — CHLORHEXIDINE GLUCONATE 4 % EX LIQD: 60.0000 mL | Freq: Once | CUTANEOUS | Status: DC

## 2019-03-13 MED ORDER — PROPOFOL 10 MG/ML IV BOLUS
INTRAVENOUS | Status: DC | PRN
  Administered 2019-03-13: 100 mg via INTRAVENOUS

## 2019-03-13 MED ORDER — BUPIVACAINE HCL (PF) 0.5 % IJ SOLN
INTRAMUSCULAR | Status: DC | PRN
  Administered 2019-03-13: 15 mL via PERINEURAL

## 2019-03-13 MED ORDER — FENTANYL CITRATE (PF) 100 MCG/2ML IJ SOLN
50.0000 ug | INTRAMUSCULAR | Status: DC | PRN
  Administered 2019-03-13 (×2): 50 ug via INTRAVENOUS

## 2019-03-13 MED ORDER — ACETAMINOPHEN 500 MG PO TABS
ORAL_TABLET | ORAL | Status: AC
  Filled 2019-03-13: qty 2

## 2019-03-13 MED ORDER — ACETAMINOPHEN 500 MG PO TABS
1000.0000 mg | ORAL_TABLET | Freq: Once | ORAL | Status: AC
  Administered 2019-03-13: 1000 mg via ORAL

## 2019-03-13 MED ORDER — ARTIFICIAL TEARS OPHTHALMIC OINT
TOPICAL_OINTMENT | OPHTHALMIC | Status: AC
  Filled 2019-03-13: qty 3.5

## 2019-03-13 MED ORDER — FENTANYL CITRATE (PF) 100 MCG/2ML IJ SOLN
INTRAMUSCULAR | Status: DC | PRN
  Administered 2019-03-13: 50 ug via INTRAVENOUS

## 2019-03-13 MED ORDER — OXYCODONE HCL 10 MG PO TABS: 5.0000 mg | ORAL_TABLET | Freq: Four times a day (QID) | ORAL | 0 refills | Status: DC | PRN

## 2019-03-13 MED ORDER — ROCURONIUM BROMIDE 10 MG/ML (PF) SYRINGE
PREFILLED_SYRINGE | INTRAVENOUS | Status: AC
  Filled 2019-03-13: qty 10

## 2019-03-13 MED ORDER — GLYCOPYRROLATE 0.2 MG/ML IJ SOLN
INTRAMUSCULAR | Status: DC | PRN
  Administered 2019-03-13: .2 mg via INTRAVENOUS

## 2019-03-13 MED ORDER — ONDANSETRON HCL 4 MG/2ML IJ SOLN
INTRAMUSCULAR | Status: DC | PRN
  Administered 2019-03-13: 4 mg via INTRAVENOUS

## 2019-03-13 MED ORDER — CEFAZOLIN SODIUM-DEXTROSE 2-4 GM/100ML-% IV SOLN
2.0000 g | INTRAVENOUS | Status: AC
  Administered 2019-03-13: 2 g via INTRAVENOUS

## 2019-03-13 MED FILL — tiZANidine HCL 4 MG TABS: 4 | 10 days supply | Qty: 30 | Fill #0

## 2019-03-13 SURGICAL SUPPLY — 81 items
AID PSTN UNV HD RSTRNT DISP (MISCELLANEOUS) ×1
APL PRP STRL LF DISP 70% ISPRP (MISCELLANEOUS) ×1
BIT DRILL 5/64X5 DISP (BIT) ×2 IMPLANT
BLADE SAW SAG 73X25 THK (BLADE) ×1
BLADE SAW SGTL 73X25 THK (BLADE) ×1 IMPLANT
BLADE SURG 10 STRL SS (BLADE) IMPLANT
CEMENT BONE DEPUY (Cement) ×2 IMPLANT
CHLORAPREP W/TINT 26 (MISCELLANEOUS) ×2 IMPLANT
CLEANER CAUTERY TIP 5X5 PAD (MISCELLANEOUS) IMPLANT
COOLER ICEMAN CLASSIC (MISCELLANEOUS) ×2 IMPLANT
COVER BACK TABLE 60X90IN (DRAPES) ×2 IMPLANT
COVER MAYO STAND STRL (DRAPES) ×2 IMPLANT
COVER WAND RF STERILE (DRAPES) IMPLANT
DRAPE IMP U-DRAPE 54X76 (DRAPES) ×2 IMPLANT
DRAPE INCISE IOBAN 66X45 STRL (DRAPES) ×2 IMPLANT
DRAPE SURG 17X23 STRL (DRAPES) ×2 IMPLANT
DRAPE U-SHAPE 47X51 STRL (DRAPES) ×2 IMPLANT
DRAPE U-SHAPE 76X120 STRL (DRAPES) ×4 IMPLANT
DRSG AQUACEL AG ADV 3.5X 6 (GAUZE/BANDAGES/DRESSINGS) IMPLANT
ELECT BLADE 4.0 EZ CLEAN MEGAD (MISCELLANEOUS) ×2
ELECT REM PT RETURN 9FT ADLT (ELECTROSURGICAL) ×2
ELECTRODE BLDE 4.0 EZ CLN MEGD (MISCELLANEOUS) ×1 IMPLANT
ELECTRODE REM PT RTRN 9FT ADLT (ELECTROSURGICAL) ×1 IMPLANT
GAUZE SPONGE 4X4 12PLY STRL (GAUZE/BANDAGES/DRESSINGS) ×2 IMPLANT
GLENOID CORTILOC PEGGED S35 (Joint) ×2 IMPLANT
GLOVE BIO SURGEON STRL SZ7 (GLOVE) ×2 IMPLANT
GLOVE BIO SURGEON STRL SZ7.5 (GLOVE) ×2 IMPLANT
GLOVE BIOGEL PI IND STRL 7.0 (GLOVE) ×1 IMPLANT
GLOVE BIOGEL PI IND STRL 8 (GLOVE) ×1 IMPLANT
GLOVE BIOGEL PI INDICATOR 7.0 (GLOVE) ×1
GLOVE BIOGEL PI INDICATOR 8 (GLOVE) ×1
GOWN STRL REUS W/ TWL LRG LVL3 (GOWN DISPOSABLE) ×2 IMPLANT
GOWN STRL REUS W/ TWL XL LVL3 (GOWN DISPOSABLE) ×1 IMPLANT
GOWN STRL REUS W/TWL LRG LVL3 (GOWN DISPOSABLE) ×4
GOWN STRL REUS W/TWL XL LVL3 (GOWN DISPOSABLE) ×2
GUIDEWIRE GLENOID 2.5X220 (WIRE) ×2 IMPLANT
HANDPIECE INTERPULSE COAX TIP (DISPOSABLE) ×2
HEAD HUMERAL HIGH OS 46X17 (Shoulder) ×1 IMPLANT
HEMOSTAT SURGICEL 2X14 (HEMOSTASIS) ×2 IMPLANT
HOOD PEEL AWAY FLYTE STAYCOOL (MISCELLANEOUS) ×4 IMPLANT
HUMERAL HEAD AEQUALIS 46X17 (Shoulder) ×2 IMPLANT
KIT TURNOVER KIT B (KITS) ×2 IMPLANT
MANIFOLD NEPTUNE II (INSTRUMENTS) ×2 IMPLANT
NEEDLE MAYO TROCAR (NEEDLE) IMPLANT
NEEDLE TROCAR POINT SZ 2 1/2 (NEEDLE) ×2 IMPLANT
NS IRRIG 1000ML POUR BTL (IV SOLUTION) ×2 IMPLANT
PACK BASIN DAY SURGERY FS (CUSTOM PROCEDURE TRAY) ×2 IMPLANT
PACK SHOULDER (CUSTOM PROCEDURE TRAY) ×2 IMPLANT
PAD ARMBOARD 7.5X6 YLW CONV (MISCELLANEOUS) ×4 IMPLANT
PAD CLEANER CAUTERY TIP 5X5 (MISCELLANEOUS)
PAD COLD SHLDR WRAP-ON (PAD) ×2 IMPLANT
RESTRAINT HEAD UNIVERSAL NS (MISCELLANEOUS) ×2 IMPLANT
RETRIEVER SUT HEWSON (MISCELLANEOUS) ×2 IMPLANT
SET HNDPC FAN SPRY TIP SCT (DISPOSABLE) ×1 IMPLANT
SLEEVE SCD COMPRESS KNEE MED (MISCELLANEOUS) ×2 IMPLANT
SLING ARM FOAM STRAP LRG (SOFTGOODS) IMPLANT
SLING ARM FOAM STRAP MED (SOFTGOODS) ×2 IMPLANT
SMARTMIX MINI TOWER (MISCELLANEOUS) ×2
SPONGE LAP 18X18 RF (DISPOSABLE) ×2 IMPLANT
SPONGE LAP 4X18 RFD (DISPOSABLE) IMPLANT
STEM HUMERAL SZ2B STND 70 PTC (Stem) ×2 IMPLANT
STEM HUMERAL SZ2BSTD 70 PTC (Stem) ×1 IMPLANT
STRIP CLOSURE SKIN 1/2X4 (GAUZE/BANDAGES/DRESSINGS) ×2 IMPLANT
SUCTION FRAZIER HANDLE 10FR (MISCELLANEOUS) ×1
SUCTION TUBE FRAZIER 10FR DISP (MISCELLANEOUS) ×1 IMPLANT
SUPPORT WRAP ARM LG (MISCELLANEOUS) ×2 IMPLANT
SUT ETHIBOND 2 OS 4 DA (SUTURE) IMPLANT
SUT ETHIBOND NAB CT1 #1 30IN (SUTURE) ×6 IMPLANT
SUT FIBERWIRE #2 38 T-5 BLUE (SUTURE)
SUT MNCRL AB 4-0 PS2 18 (SUTURE) ×2 IMPLANT
SUT VIC AB 0 CT1 27 (SUTURE) ×2
SUT VIC AB 0 CT1 27XBRD ANBCTR (SUTURE) ×1 IMPLANT
SUT VIC AB 2-0 CT1 27 (SUTURE) ×2
SUT VIC AB 2-0 CT1 TAPERPNT 27 (SUTURE) ×1 IMPLANT
SUTURE FIBERWR #2 38 T-5 BLUE (SUTURE) IMPLANT
SYR BULB 3OZ (MISCELLANEOUS) ×2 IMPLANT
TAPE LABRALWHITE 1.5X36 (TAPE) ×2 IMPLANT
TAPE SUT LABRALTAP WHT/BLK (SUTURE) ×2 IMPLANT
TOWEL GREEN STERILE FF (TOWEL DISPOSABLE) ×4 IMPLANT
TOWER SMARTMIX MINI (MISCELLANEOUS) ×1 IMPLANT
YANKAUER SUCT BULB TIP NO VENT (SUCTIONS) ×2 IMPLANT

## 2019-03-13 NOTE — H&P (Signed)
[name] is an 64 y.o. female.   Chief Complaint:L shoulder pain and dysfunction HPI: Endstage L shoulder arthritis with significant pain and dysfunction, failed conservative measures.  Pain interferes with sleep and quality of life.   Past Medical History:  Diagnosis Date  . ARDS (adult respiratory distress syndrome) ([place]) 2007  . Arthritis   . Depression   . Gallstones 01/07/2018  . Ganglion cyst of dorsum of right wrist   . Headache   . MRSA (methicillin resistant Staphylococcus aureus) 2007  . Osteoporosis   . Pneumonia 2009  . Prosthetic eye globe    right eye   . Raynaud's disease     Past Surgical History:  Procedure Laterality Date  . ANTERIOR CERVICAL DECOMP/DISCECTOMY FUSION N/A 12/12/2014   Procedure: ANTERIOR CERVICAL DECOMPRESSION/DISCECTOMY FUSION 3 LEVELS;  Surgeon: Phylliss Bob, MD;  Location: [facility];  Service: Orthopedics;  Laterality: N/A;  Anterior cervical decompression fusion, cerivcal 5-6, cervical 6-7, cervical 7-thoracic 1 with instrumentation and allografrt  . APPENDECTOMY  1974  . CATARACT EXTRACTION Left 11/2018  . CHOLECYSTECTOMY N/A 01/07/2018   Procedure: LAPAROSCOPIC CHOLECYSTECTOMY WITH INTRAOPERATIVE CHOLANGIOGRAM;  Surgeon: Fanny Skates, MD;  Location: [facility];  Service: General;  Laterality: N/A;  . COLONOSCOPY  04/14/2013   per Dr. Ardis Hughs, clear, repeat in 10 yrs   . CORNEAL TRANSPLANT Right 2007   x4  . ENUCLEATION Right 2013   eye infection  . GANGLION CYST EXCISION Right 05/25/2016   Procedure: RIGHT WRIST ULNAR GANGLION EXCISION;  Surgeon: Milly Jakob, MD;  Location: [facility];  Service: Orthopedics;  Laterality: Right;  . RHINOPLASTY  1980  . TONSILLECTOMY     removed in 3rd grade    Family History  Problem Relation Age of Onset  . Breast cancer Maternal Aunt   . Colon cancer Neg Hx    Social History:  reports that she has never smoked. She has never used smokeless tobacco. She reports that she  does not drink alcohol or use drugs.  Allergies: No Known Allergies  Medications Prior to Admission  Medication Sig Dispense Refill  . B Complex-C (SUPER B COMPLEX PO) Take 1 tablet by mouth daily.    . Calcium Carbonate-Vitamin D (CALCIUM 600+D) 600-400 MG-UNIT tablet Take 1 tablet by mouth daily.     . Carboxymethylcellul-Glycerin (REFRESH OPTIVE PF OP) Apply 1 drop to eye daily as needed (dry eyes).    . celecoxib (CELEBREX) 200 MG capsule Take 1 capsule (200 mg total) by mouth 2 (two) times daily. 60 capsule 11  . Diclofenac Sodium 2 % SOLN Apply 1 application topically 4 (four) times daily. 112 g 11  . gabapentin (NEURONTIN) 300 MG capsule TAKE 1 CAPSULE (300 MG TOTAL) BY MOUTH 3 TIMES DAILY. 270 capsule 1  . mometasone (NASONEX) 50 MCG/ACT nasal spray INSTILL 2 SPRAYS INTO THE NOSE DAILY AS NEEDED FOR ALLERGIES 17 g 11  . [START ON 03/31/2019] Oxycodone HCl 10 MG TABS Take 1 tablet (10 mg total) by mouth every 6 (six) hours as needed (pain). 120 tablet 0  . polyethylene glycol powder (GLYCOLAX/MIRALAX) powder TAKE 17 GRAMS BY MOUTH 2 TIMES DAILY AS NEEDED. (Patient taking differently: Take 0.5 Containers by mouth daily as needed for moderate constipation. ) 3162 g 1  . valACYclovir (VALTREX) 1000 MG tablet Take 1,000 mg by mouth daily.     . verapamil (CALAN-SR) 120 MG CR tablet Take 1 tablet (120 mg total) by mouth at bedtime. 30 tablet 5  .  acetaminophen (TYLENOL) 500 MG tablet Take 500 mg by mouth every 6 (six) hours as needed.    Marland Kitchen amphetamine-dextroamphetamine (ADDERALL XR) 20 MG 24 hr capsule Take 1 capsule (20 mg total) by mouth every morning. 30 capsule 0    No results found for this or any previous visit (from the past 48 hour(s)). No results found.  Review of Systems  All other systems reviewed and are negative.   Blood pressure 137/73, pulse 81, temperature 98.6 F (37 C), temperature source Oral, resp. rate 14, height 5\' 1"  (1.549 m), weight 48.7 kg, SpO2 100 %. Physical  Exam  Constitutional: She is oriented to person, place, and time. She appears well-developed and well-nourished.  HENT:  Head: Atraumatic.  Eyes: EOM are normal.  Cardiovascular: Intact distal pulses.  Respiratory: Effort normal.  Musculoskeletal:     Comments: L shoulder pain with limited ROM. NVID  Neurological: She is alert and oriented to person, place, and time.  Skin: Skin is warm and dry.  Psychiatric: She has a normal mood and affect.     Assessment/Plan Endstage L shoulder arthritis with significant pain and dysfunction, failed conservative measures.  Pain interferes with sleep and quality of life. Plan L TSA Risks / benefits of surgery discussed Consent on chart  NPO for OR Preop antibiotics   Isabella Stalling, MD 03/13/2019, 12:04 PM

## 2019-03-13 NOTE — Transfer of Care (Signed)
Immediate Anesthesia Transfer of Care Note  Patient: [name]  Procedure(s) Performed: TOTAL SHOULDER ARTHROPLASTY (Left Shoulder)  Patient Location: PACU  Anesthesia Type:General and Regional  Level of Consciousness: drowsy and patient cooperative  Airway & Oxygen Therapy: Patient Spontanous Breathing and Patient connected to face mask oxygen  Post-op Assessment: Report given to RN and Post -op Vital signs reviewed and stable  Post vital signs: Reviewed and stable  Last Vitals:  Vitals Value Taken Time  BP 130/72 03/13/19 1356  Temp    Pulse 79 03/13/19 1359  Resp 17 03/13/19 1359  SpO2 100 % 03/13/19 1359  Vitals shown include unvalidated device data.  Last Pain:  Vitals:   03/13/19 1018  TempSrc: Oral  PainSc: 0-No pain         Complications: No apparent anesthesia complications

## 2019-03-13 NOTE — Anesthesia Procedure Notes (Signed)
Anesthesia Regional Block: Interscalene brachial plexus block   Pre-Anesthetic Checklist: ,, timeout performed, Correct Patient, Correct Site, Correct Laterality, Correct Procedure, Correct Position, site marked, Risks and benefits discussed,  Surgical consent,  Pre-op evaluation,  At surgeon's request and post-op pain management  Laterality: Left  Prep: Maximum Sterile Barrier Precautions used, chloraprep       Needles:  Injection technique: Single-shot  Needle Type: Echogenic Stimulator Needle     Needle Length: 4cm  Needle Gauge: 22     Additional Needles:   Procedures:,,,, ultrasound used (permanent image in chart),,,,  Narrative:  Start time: 03/13/2019 10:41 AM End time: 03/13/2019 10:51 AM Injection made incrementally with aspirations every 5 mL.  Performed by: Personally  Anesthesiologist: Freddrick March, MD  Additional Notes: Monitors applied. No increased pain on injection. No increased resistance to injection. Injection made in 5cc increments. Good needle visualization. Patient tolerated procedure well.

## 2019-03-13 NOTE — Anesthesia Postprocedure Evaluation (Signed)
Anesthesia Post Note  Patient: [name]  Procedure(s) Performed: TOTAL SHOULDER ARTHROPLASTY (Left Shoulder)     Patient location during evaluation: PACU Anesthesia Type: Regional and General Level of consciousness: awake and alert Pain management: pain level controlled Vital Signs Assessment: post-procedure vital signs reviewed and stable Respiratory status: spontaneous breathing, nonlabored ventilation, respiratory function stable and patient connected to nasal cannula oxygen Cardiovascular status: blood pressure returned to baseline and stable Postop Assessment: no apparent nausea or vomiting Anesthetic complications: no    Last Vitals:  Vitals:   03/13/19 1400 03/13/19 1415  BP: 123/70 (!) 148/69  Pulse: 66 61  Resp: 13 16  Temp:    SpO2: 100% 95%    Last Pain:  Vitals:   03/13/19 1430  TempSrc:   PainSc: 0-No pain                 Caio Devera L Eliyah Mcshea

## 2019-03-13 NOTE — Anesthesia Procedure Notes (Addendum)
Procedure Name: Intubation Date/Time: 03/13/2019 12:25 PM Performed by: Collier Bullock, CRNA Pre-anesthesia Checklist: Patient identified, Emergency Drugs available, Suction available and Patient being monitored Patient Re-evaluated:Patient Re-evaluated prior to induction Oxygen Delivery Method: Circle system utilized Preoxygenation: Pre-oxygenation with 100% oxygen Induction Type: IV induction Ventilation: Mask ventilation without difficulty Laryngoscope Size: Mac and 3 Grade View: Grade I Tube type: Oral Tube size: 7.0 mm Number of attempts: 1 Airway Equipment and Method: Patient positioned with wedge pillow and Stylet Placement Confirmation: ETT inserted through vocal cords under direct vision,  positive ETCO2 and breath sounds checked- equal and bilateral Secured at: 21 cm Tube secured with: Tape Dental Injury: Teeth and Oropharynx as per pre-operative assessment

## 2019-03-13 NOTE — Op Note (Addendum)
Procedure(s): TOTAL SHOULDER ARTHROPLASTY Procedure Note  [name] female 64 y.o. 03/13/2019  Preoperative diagnosis: Left shoulder end-stage arthritis  Postoperative diagnosis: Same  Procedure(s) and Anesthesia Type:    LEFT TOTAL SHOULDER ARTHROPLASTY - General  Surgeon(s) and Role:    Tania Ade, MD - Primary   Indications:  64 y.o. female  With endstage left shoulder arthritis. Pain and dysfunction interfered with quality of life and nonoperative treatment with activity modification, NSAIDS and injections failed.     Surgeon: Isabella Stalling   Assistants: Jeanmarie Hubert PA-C [facility] was present and scrubbed throughout the procedure and was essential in positioning, retraction, exposure, and closure)  Anesthesia: General endotracheal anesthesia with preoperative interscalene block given by the attending anesthesiologist    Procedure Detail  TOTAL SHOULDER ARTHROPLASTY  Findings: Tornier flex anatomic press-fit size 2 stem with a 46 head, cemented size small 35 Cortiloc glenoid.   A lesser tuberosity osteotomy was performed and repaired at the conclusion of the procedure.  Estimated Blood Loss:  200 mL         Drains: None   Blood Given: none          Specimens: none        Complications:  * No complications entered in OR log *         Disposition: PACU - hemodynamically stable.         Condition: stable    Procedure:   The patient was identified in the preoperative holding area where I personally marked the operative extremity after verifying with the patient and consent. She  was taken to the operating room where She was transferred to the   operative table.  The patient received an interscalene block in   the holding area by the attending anesthesiologist.  General anesthesia was induced   in the operating room without complication.  The patient did receive IV  Ancef prior to the commencement of the procedure.  The patient was    placed in the beach-chair position with the back raised about 30   degrees.  The nonoperative extremity and head and neck were carefully   positioned and padded protecting against neurovascular compromise.  The   left upper extremity was then prepped and draped in the standard sterile   fashion.    The appropriate operative time-out was performed with   Anesthesia, the perioperative staff, as well as myself and we all agreed   that the left side was the correct operative site.  An approximately   10 cm incision was made from the tip of the coracoid to the center point of the   humerus at the level of the axilla.  Dissection was carried down sharply   through subcutaneous tissues and cephalic vein was identified and taken   laterally with the deltoid.  The pectoralis major was taken medially.  The   upper 1 cm of the pectoralis major was released from its attachment on   the humerus.  The clavipectoral fascia was incised just lateral to the   conjoined tendon.  This incision was carried up to but not into the   coracoacromial ligament.  Digital palpation was used to prove   integrity of the axillary nerve which was protected throughout the   procedure.  Musculocutaneous nerve was not palpated in the operative   field.  Conjoined tendon was then retracted gently medially and the   deltoid laterally.  Anterior circumflex humeral vessels were clamped and  coagulated.  The soft tissues overlying the biceps was incised and this   incision was carried across the transverse humeral ligament to the base   of the coracoid.  The biceps was noted to be severely degenerated. It was released from the superior labrum. The biceps was then tenodesed to the soft tissue just above   pectoralis major and the remaining portion of the biceps superiorly was   excised.  An osteotomy was performed at the lesser tuberosity.  The capsule was then   released all the way down to the 6 o'clock position of the humeral  head.   The humeral head was then delivered with simultaneous adduction,   extension and external rotation.  All humeral osteophytes were removed   and the anatomic neck of the humerus was marked and cut free hand at   approximately 25 degrees retroversion within about 3 mm of the cuff   reflection posteriorly.  The head size was estimated to be a 46 medium   offset.  At that point, the humeral head was retracted posteriorly with   a Fukuda retractor.   Remaining portion of the capsule was released at the base of the   coracoid.  The remaining biceps anchor and the entire anterior-inferior   labrum was excised.  The posterior labrum was also excised but the   posterior capsule was not released.  The guidepin was placed bicortically with none elevated guide.  The reamer was used to ream to concentric bone with punctate bleeding.  This gave an excellent concentric surface.  The center hole was then drilled for an anchor peg glenoid followed by the three peripheral holes and none of the holes   exited the glenoid wall.  I then pulse irrigated these holes and dried   them with Surgicel.  The three peripheral holes were then   pressurized cemented and the anchor peg glenoid was placed and impacted   with an excellent fit.  The glenoid was a 35 small component.  The proximal humerus was then again exposed taking care not to displace the glenoid.    The entry awl was used followed by sounding reamers and then sequentially broached from size 1-2.  This was then left in place and the calcar planer was used. Trial head was placed with a 46.  With the trial implantation of the component,  there was approximately 50% posterior translation with immediate snap back to the   anatomic position.  With forward elevation, there was no tendency   towards posterior subluxation.   The trial was removed and the final implant was prepared on a back table.  The trial was removed and the final implant was prepared on a back  table.   3 small holes were drilled on the medial side of the lesser tuberosity osteotomy, through which 2 labral tapes were passed. The implant was then placed through the loop of the 2 labral tapes and impacted with an excellent press-fit. This achieved excellent anatomic reconstruction of the proximal humerus.  The joint was then copiously irrigated with pulse lavage.  The subscapularis and   lesser tuberosity osteotomy were then repaired using the 2 labral tapes previously passed in a double row fashion with horizontal mattress sutures medially brought over through bone tunnels tied over a bone bridge laterally.   One #1 Ethibond was placed at the rotator interval just above   the lesser tuberosity. Copious irrigation was used. Skin was closed with 2-0 Vicryl sutures in the  deep dermal layer and 4-0 Monocryl in a subcuticular  running fashion.  Sterile dressings were then applied including Aquacel.  The patient was placed in a sling and allowed to awaken from general anesthesia and taken to the recovery room in stable condition.      POSTOPERATIVE PLAN:  Early passive range of motion will be allowed with the goal of 0 degrees external rotation and 90 degrees forward elevation.  No internal rotation at this time.  No active motion of the arm until the lesser tuberosity heals.  The patient will be observed in the recovery room and if she has a good block and is physiologically doing well can be discharged home today with family.

## 2019-03-13 NOTE — Discharge Instructions (Signed)
Discharge Instructions after Total Shoulder Arthroplasty   . A sling has been provided for you. Remove the sling 5 times each day to perform motion exercises.  . Use ice on the shoulder intermittently over the first 48 hours after surgery.  . Pain medication has been prescribed for you.  . Use your medication liberally over the first 48 hours, and then begin to taper your use. You may take Extra Strength Tylenol or Tylenol only in place of the pain pills. DO NOT take ANY nonsteroidal anti-inflammatory pain medications: Advil, Motrin, Ibuprofen, Aleve, Naproxen, or Naprosyn. . Take one aspirin a day for 2 weeks after surgery, unless you have an aspirin sensitivity/allergy or asthma. . Leave your dressing on until your first follow up visit.  You may shower with the dressing.  Hold your arm as if you still have your sling on while you shower. . Active reaching and lifting are not permitted. You may use the operative arm for activities of daily living that do not require the operative arm to leave the side of the body, such as eating, drinking, bathing, etc.  . Three to 5 times each day you should perform assisted overhead reaching and external rotation (outward turning) exercises with the operative arm. You were taught these exercises prior to discharge. Both exercises should be done with the non-operative arm used as the "therapist arm" while the operative arm remains relaxed. Ten of each exercise should be done three to five times each day.   Overhead reach is helping to lift your stiff arm up as high as it will go. To stretch your overhead reach, lie flat on your back, relax, and grasp the wrist of the tight shoulder with your opposite hand. Using the power in your opposite arm, bring the stiff arm up as far as it is comfortable. Start holding it for ten seconds and then work up to where you can hold it for a count of 30. Breathe slowly and deeply while the arm is moved. Repeat this stretch ten times,  trying to help the ar up a little higher each time.     External rotation is turning the arm out to the side while your elbow stays close to your body. External rotation is best stretched while you are lying on your back. Hold a cane, yardstick, broom handle, or dowel in both hands. Bend both elbows to a right angle. Use steady, gentle force from your normal arm to rotate the hand of the stiff shoulder out away from your body. Continue the rotation until it is straight in front of you holding it there for a count of 10. Do not go beyond this level of rotation until seen back by Dr. Tamera Punt. Repeat this exercise ten times slowly.      Please call [phone] during normal business hours or [phone] after hours for any problems. Including the following:  - excessive redness of the incisions - drainage for more than 4 days - fever of more than 101.5 F  *Please note that pain medications will not be refilled after hours or on weekends.  Post Anesthesia Home Care Instructions  Activity: Get plenty of rest for the remainder of the day. A responsible individual must stay with you for 24 hours following the procedure.  For the next 24 hours, DO NOT: -Drive a car -Paediatric nurse -Drink alcoholic beverages -Take any medication unless instructed by your physician -Make any legal decisions or sign important papers.  Meals: Start with liquid foods  such as gelatin or soup. Progress to regular foods as tolerated. Avoid greasy, spicy, heavy foods. If nausea and/or vomiting occur, drink only clear liquids until the nausea and/or vomiting subsides. Call your physician if vomiting continues.  Special Instructions/Symptoms: Your throat may feel dry or sore from the anesthesia or the breathing tube placed in your throat during surgery. If this causes discomfort, gargle with warm salt water. The discomfort should disappear within 24 hours.  If you had a scopolamine patch placed behind your ear  for the management of post- operative nausea and/or vomiting:  1. The medication in the patch is effective for 72 hours, after which it should be removed.  Wrap patch in a tissue and discard in the trash. Wash hands thoroughly with soap and water. 2. You may remove the patch earlier than 72 hours if you experience unpleasant side effects which may include dry mouth, dizziness or visual disturbances. 3. Avoid touching the patch. Wash your hands with soap and water after contact with the patch.    Regional Anesthesia Blocks  1. Numbness or the inability to move the "blocked" extremity may last from 3-48 hours after placement. The length of time depends on the medication injected and your individual response to the medication. If the numbness is not going away after 48 hours, call your surgeon.  2. The extremity that is blocked will need to be protected until the numbness is gone and the  Strength has returned. Because you cannot feel it, you will need to take extra care to avoid injury. Because it may be weak, you may have difficulty moving it or using it. You may not know what position it is in without looking at it while the block is in effect.  3. For blocks in the legs and feet, returning to weight bearing and walking needs to be done carefully. You will need to wait until the numbness is entirely gone and the strength has returned. You should be able to move your leg and foot normally before you try and bear weight or walk. You will need someone to be with you when you first try to ensure you do not fall and possibly risk injury.  4. Bruising and tenderness at the needle site are common side effects and will resolve in a few days.  5. Persistent numbness or new problems with movement should be communicated to the surgeon or the [place] [phone] [place] [phone]).  Information for Discharge Teaching: EXPAREL (bupivacaine liposome injectable  suspension)   Your surgeon or anesthesiologist gave you EXPAREL(bupivacaine) to help control your pain after surgery.   EXPAREL is a local anesthetic that provides pain relief by numbing the tissue around the surgical site.  EXPAREL is designed to release pain medication over time and can control pain for up to 72 hours.  Depending on how you respond to EXPAREL, you may require less pain medication during your recovery.  Possible side effects:  Temporary loss of sensation or ability to move in the area where bupivacaine was injected.  Nausea, vomiting, constipation  Rarely, numbness and tingling in your mouth or lips, lightheadedness, or anxiety may occur.  Call your doctor right away if you think you may be experiencing any of these sensations, or if you have other questions regarding possible side effects.  Follow all other discharge instructions given to you by your surgeon or nurse. Eat a healthy diet and drink plenty of water or other fluids.  If you  return to the hospital for any reason within 96 hours following the administration of EXPAREL, it is important for health care providers to know that you have received this anesthetic. A teal colored band has been placed on your arm with the date, time and amount of EXPAREL you have received in order to alert and inform your health care providers. Please leave this armband in place for the full 96 hours following administration, and then you may remove the band.Information for Discharge Teaching: EXPAREL (bupivacaine liposome injectable suspension)   Your surgeon or anesthesiologist gave you EXPAREL(bupivacaine) to help control your pain after surgery.   EXPAREL is a local anesthetic that provides pain relief by numbing the tissue around the surgical site.  EXPAREL is designed to release pain medication over time and can control pain for up to 72 hours.  Depending on how you respond to EXPAREL, you may require less pain medication  during your recovery.  Possible side effects:  Temporary loss of sensation or ability to move in the area where bupivacaine was injected.  Nausea, vomiting, constipation  Rarely, numbness and tingling in your mouth or lips, lightheadedness, or anxiety may occur.  Call your doctor right away if you think you may be experiencing any of these sensations, or if you have other questions regarding possible side effects.  Follow all other discharge instructions given to you by your surgeon or nurse. Eat a healthy diet and drink plenty of water or other fluids.  If you return to the hospital for any reason within 96 hours following the administration of EXPAREL, it is important for health care providers to know that you have received this anesthetic. A teal colored band has been placed on your arm with the date, time and amount of EXPAREL you have received in order to alert and inform your health care providers. Please leave this armband in place for the full 96 hours following administration, and then you may remove the band.

## 2019-03-13 NOTE — Progress Notes (Signed)
Assisted Dr. Woodrum with left, ultrasound guided, interscalene  block. Side rails up, monitors on throughout procedure. See vital signs in flow sheet. Tolerated Procedure well. 

## 2019-03-13 NOTE — Anesthesia Preprocedure Evaluation (Addendum)
Anesthesia Evaluation  Patient identified by MRN, date of birth, ID band Patient awake    Reviewed: Allergy & Precautions, NPO status , Patient's Chart, lab work & pertinent test results  Airway Mallampati: II  TM Distance: >3 FB Neck ROM: Full    Dental  (+) Missing, Dental Advisory Given, Partial Lower, Partial Upper,    Pulmonary neg pulmonary ROS,    Pulmonary exam normal breath sounds clear to auscultation       Cardiovascular Past MI: 2010.  Normal cardiovascular exam Rhythm:Regular Rate:Normal  TTE 2010 Normal EF, no significant valvular abnormalities   Neuro/Psych  Headaches, PSYCHIATRIC DISORDERS Depression    GI/Hepatic negative GI ROS, (+) Hepatitis - (s/p treatment), CPlt 131, INR 1.0   Endo/Other  negative endocrine ROS  Renal/GU negative Renal ROS  negative genitourinary   Musculoskeletal  (+) Arthritis , S/p ACDF 2016   Abdominal   Peds  Hematology negative hematology ROS (+)   Anesthesia Other Findings Prosthetic right eye  Reproductive/Obstetrics                           Anesthesia Physical Anesthesia Plan  ASA: III  Anesthesia Plan: General and Regional   Post-op Pain Management:  Regional for Post-op pain   Induction: Intravenous  PONV Risk Score and Plan: 3 and Midazolam, Dexamethasone and Ondansetron  Airway Management Planned: Oral ETT  Additional Equipment:   Intra-op Plan:   Post-operative Plan: Extubation in OR  Informed Consent: I have reviewed the patients History and Physical, chart, labs and discussed the procedure including the risks, benefits and alternatives for the proposed anesthesia with the patient or authorized representative who has indicated his/her understanding and acceptance.     Dental advisory given  Plan Discussed with: CRNA  Anesthesia Plan Comments:         Anesthesia Quick Evaluation

## 2019-03-14 ENCOUNTER — Encounter: Payer: Self-pay | Admitting: *Deleted

## 2019-03-14 NOTE — Addendum Note (Signed)
Addendum  created 03/14/19 1127 by Tawni Millers, CRNA   Charge Capture section accepted

## 2019-03-24 MED FILL — tiZANidine HCL 4 MG TABS: 4 | 10 days supply | Qty: 30 | Fill #1

## 2019-03-27 DIAGNOSIS — M19012 Primary osteoarthritis, left shoulder: Secondary | ICD-10-CM | POA: Diagnosis not present

## 2019-03-31 ENCOUNTER — Other Ambulatory Visit: Payer: Self-pay | Admitting: Family Medicine

## 2019-03-31 MED ORDER — AMPHETAMINE-DEXTROAMPHET ER 20 MG PO CP24: 20.0000 mg | ORAL_CAPSULE | Freq: Every day | ORAL | 0 refills | Status: DC

## 2019-03-31 MED FILL — oxyCODONE HCL 10 MG TABS: 10 | 30 days supply | Qty: 120 | Fill #0

## 2019-03-31 MED FILL — ADDERALL XR 20 MG CAP SA: 20 | 30 days supply | Qty: 30 | Fill #0

## 2019-03-31 NOTE — Telephone Encounter (Signed)
Duplicate

## 2019-03-31 NOTE — Telephone Encounter (Signed)
Done

## 2019-03-31 NOTE — Telephone Encounter (Signed)
Last filled 02/01/2019 Last OV 02/10/2019  Ok to fill?

## 2019-04-12 MED FILL — VERAPAMIL ER 120 MG TABLET: 120 | 30 days supply | Qty: 30 | Fill #2

## 2019-04-25 ENCOUNTER — Encounter: Payer: Self-pay | Admitting: Family Medicine

## 2019-04-26 ENCOUNTER — Telehealth (INDEPENDENT_AMBULATORY_CARE_PROVIDER_SITE_OTHER): Payer: 59 | Admitting: Family Medicine

## 2019-04-26 ENCOUNTER — Encounter: Payer: Self-pay | Admitting: Family Medicine

## 2019-04-26 DIAGNOSIS — F1190 Opioid use, unspecified, uncomplicated: Secondary | ICD-10-CM | POA: Diagnosis not present

## 2019-04-26 DIAGNOSIS — M19012 Primary osteoarthritis, left shoulder: Secondary | ICD-10-CM | POA: Diagnosis not present

## 2019-04-26 DIAGNOSIS — M2550 Pain in unspecified joint: Secondary | ICD-10-CM

## 2019-04-26 MED ORDER — OXYCODONE HCL 10 MG PO TABS: 10.0000 mg | ORAL_TABLET | Freq: Four times a day (QID) | ORAL | 0 refills | Status: DC | PRN

## 2019-04-26 MED ORDER — TOBRAMYCIN 0.3 % OP SOLN: 2.0000 [drp] | OPHTHALMIC | 1 refills | Status: DC

## 2019-04-26 MED ORDER — OXYCODONE HCL 10 MG PO TABS: 10.0000 mg | ORAL_TABLET | Freq: Four times a day (QID) | ORAL | 0 refills | Status: AC | PRN

## 2019-04-26 MED FILL — TOBRAMYCIN 0.3 % SOLN: 0.3 | 12 days supply | Qty: 5 | Fill #0

## 2019-04-26 NOTE — Telephone Encounter (Signed)
Yes we can address this also tomorrow

## 2019-04-26 NOTE — Telephone Encounter (Signed)
Patient was seen this morning. This has been addressed.

## 2019-04-26 NOTE — Progress Notes (Signed)
   Subjective:    Patient ID: [name], female    DOB: Jun 11, 1955, 64 y.o.   MRN: [id number]  HPI Virtual Visit via Telephone Note  I connected with the patient on 04/26/19 at  2:30 PM EDT by telephone and verified that I am speaking with the correct person using two identifiers.   I discussed the limitations, risks, security and privacy concerns of performing an evaluation and management service by telephone and the availability of in person appointments. I also discussed with the patient that there may be a patient responsible charge related to this service. The patient expressed understanding and agreed to proceed.  Location patient: home Location provider: work or home office Participants present for the call: patient, provider Patient did not have a visit in the prior 7 days to address this/these issue(s).   History of Present Illness: Here for pain management. She is doing well. She had a left shoulder total arthroplasty on 03-13-19, and this went well. She is going to begin rehab soon. She plans to have the other shoulder replaced sometime in the next year.  Indication for chronic opioid: osteoarthritis Medication and dose: Oxycodone 10 mg # pills per month: 120 Last UDS date: 02-10-19 Opioid Treatment Agreement signed (Y/N): 04-30-17 Opioid Treatment Agreement last reviewed with patient:  04-26-19 [facility] reviewed this encounter (include red flags): Yes    Observations/Objective: Patient sounds cheerful and well on the phone. I do not appreciate any SOB. Speech and thought processing are grossly intact. Patient reported vitals:  Assessment and Plan: Pain management, meds were refilled.  [name], MD   Follow Up Instructions:     [phone] 5-10 [phone] 11-20 [id number] 21-30 I did not refer this patient for an OV in the next 24 hours for this/these issue(s).  I discussed the assessment and treatment plan with the patient. The patient was provided an opportunity  to ask questions and all were answered. The patient agreed with the plan and demonstrated an understanding of the instructions.   The patient was advised to call back or seek an in-person evaluation if the symptoms worsen or if the condition fails to improve as anticipated.  I provided 12 minutes of non-face-to-face time during this encounter.   [name], MD    Review of Systems     Objective:   Physical Exam        Assessment & Plan:

## 2019-04-27 DIAGNOSIS — L570 Actinic keratosis: Secondary | ICD-10-CM | POA: Diagnosis not present

## 2019-04-27 DIAGNOSIS — D225 Melanocytic nevi of trunk: Secondary | ICD-10-CM | POA: Diagnosis not present

## 2019-04-27 DIAGNOSIS — L578 Other skin changes due to chronic exposure to nonionizing radiation: Secondary | ICD-10-CM | POA: Diagnosis not present

## 2019-04-27 DIAGNOSIS — D1801 Hemangioma of skin and subcutaneous tissue: Secondary | ICD-10-CM | POA: Diagnosis not present

## 2019-04-27 DIAGNOSIS — L814 Other melanin hyperpigmentation: Secondary | ICD-10-CM | POA: Diagnosis not present

## 2019-04-27 DIAGNOSIS — L821 Other seborrheic keratosis: Secondary | ICD-10-CM | POA: Diagnosis not present

## 2019-05-01 MED FILL — ADDERALL XR 20 MG CAP SA: 20 | 30 days supply | Qty: 30 | Fill #0

## 2019-05-01 MED FILL — oxyCODONE HCL 10 MG TABS: 10 | 30 days supply | Qty: 120 | Fill #0

## 2019-05-03 ENCOUNTER — Encounter: Payer: Self-pay | Admitting: Physical Therapy

## 2019-05-03 ENCOUNTER — Ambulatory Visit: Payer: 59 | Attending: Surgical | Admitting: Physical Therapy

## 2019-05-03 ENCOUNTER — Other Ambulatory Visit: Payer: Self-pay

## 2019-05-03 DIAGNOSIS — R293 Abnormal posture: Secondary | ICD-10-CM | POA: Insufficient documentation

## 2019-05-03 DIAGNOSIS — M25612 Stiffness of left shoulder, not elsewhere classified: Secondary | ICD-10-CM | POA: Diagnosis not present

## 2019-05-03 DIAGNOSIS — M6281 Muscle weakness (generalized): Secondary | ICD-10-CM | POA: Insufficient documentation

## 2019-05-03 DIAGNOSIS — M25512 Pain in left shoulder: Secondary | ICD-10-CM | POA: Diagnosis not present

## 2019-05-03 MED FILL — CELECOXIB 200 MG CAP: 200 | 30 days supply | Qty: 60 | Fill #2

## 2019-05-03 NOTE — Therapy (Signed)
[facility] [address]  [place], [place], [address] Phone: [phone]   Fax:  [phone]  Physical Therapy Evaluation  Patient Details  Name: [name] MRN: [id number] Date of Birth: 02/03/1955 Referring Provider (PT): Grier Mitts, [place]   Encounter Date: 05/03/2019  PT End of Session - 05/03/19 1408    Visit Number  1    Number of Visits  17    Date for PT Re-Evaluation  06/28/19    Authorization Type  [facility]    PT Start Time  1312    PT Stop Time  1359    PT Time Calculation (min)  47 min    Activity Tolerance  Patient tolerated treatment well;Patient limited by pain    Behavior During Therapy  [facility] for tasks assessed/performed       Past Medical History:  Diagnosis Date  . ARDS (adult respiratory distress syndrome) ([place]) 2007  . Arthritis   . Depression   . Gallstones 01/07/2018  . Ganglion cyst of dorsum of right wrist   . Headache   . MRSA (methicillin resistant Staphylococcus aureus) 2007  . Osteoporosis   . Pneumonia 2009  . Prosthetic eye globe    right eye   . Raynaud's disease     Past Surgical History:  Procedure Laterality Date  . ANTERIOR CERVICAL DECOMP/DISCECTOMY FUSION N/A 12/12/2014   Procedure: ANTERIOR CERVICAL DECOMPRESSION/DISCECTOMY FUSION 3 LEVELS;  Surgeon: Phylliss Bob, MD;  Location: [facility];  Service: Orthopedics;  Laterality: N/A;  Anterior cervical decompression fusion, cerivcal 5-6, cervical 6-7, cervical 7-thoracic 1 with instrumentation and allografrt  . APPENDECTOMY  1974  . CATARACT EXTRACTION Left 11/2018  . CHOLECYSTECTOMY N/A 01/07/2018   Procedure: LAPAROSCOPIC CHOLECYSTECTOMY WITH INTRAOPERATIVE CHOLANGIOGRAM;  Surgeon: Fanny Skates, MD;  Location: [facility];  Service: General;  Laterality: N/A;  . COLONOSCOPY  04/14/2013   per Dr. Ardis Hughs, clear, repeat in 10 yrs   . CORNEAL TRANSPLANT Right 2007   x4  . ENUCLEATION Right 2013   eye infection   . GANGLION CYST EXCISION Right 05/25/2016   Procedure: RIGHT WRIST ULNAR GANGLION EXCISION;  Surgeon: Milly Jakob, MD;  Location: [facility];  Service: Orthopedics;  Laterality: Right;  . RHINOPLASTY  1980  . TONSILLECTOMY     removed in 3rd grade  . TOTAL SHOULDER ARTHROPLASTY Left 03/13/2019   Procedure: TOTAL SHOULDER ARTHROPLASTY;  Surgeon: Tania Ade, MD;  Location: [facility];  Service: Orthopedics;  Laterality: Left;    There were no vitals filed for this visit.   Subjective Assessment - 05/03/19 1314    Subjective  Patient reports undergoing L TSA on 03/13/19. Pain levels fluctuate. Pain occurs down the L anterior arm. Reports that this pain is different from her pre-operative pain. Worse in the AM and has difficulty opening jars, pulling/pushing to open/close the car door, reaching up to do her hair, pulling on pants. Denies N/T. The R arm has also been bothering her as she has been compensating with it. No longer using sling. Patient would like to return to paddle boarding and must be able to push her mother in her mobility chair and she is her caregiver.    Pertinent History  raynaud's, prosthetic eye globe, osteoporosis, HA, depression, ACDF C5-6. C6-7. C7-T1 2016    Limitations  Reading;Lifting;House hold activities;Writing    Diagnostic tests  none recent    Patient Stated Goals  "I want to be able to use  my arm again"    Currently in Pain?  Yes    Pain Score  6     Pain Location  Arm    Pain Orientation  Left    Pain Descriptors / Indicators  Aching;Sharp;Throbbing    Pain Type  Acute pain;Surgical pain         [facility] PT Assessment - 05/03/19 1321      Assessment   Medical Diagnosis  s/p L TSA    Referring Provider (PT)  Grier Mitts, PA-C    Onset Date/Surgical Date  03/13/19    Hand Dominance  Right    Next MD Visit  pt unsure    Prior Therapy  yes      Balance Screen   Has the patient fallen in the past 6 months  No     Has the patient had a decrease in activity level because of a fear of falling?   No    Is the patient reluctant to leave their home because of a fear of falling?   No      Home Social worker  Private residence    Living Arrangements  Parent   caregiver for mother   Available Help at Discharge  Family    Type of [place]      Prior Function   Level of Independence  Independent    Vocation  Full time employment    Publishing copy work for [employer]    Leisure  paddleboarding       Cognition   Overall Cognitive Status  Within Functional Limits for tasks assessed      Observation/Other Assessments   Observations  nearly healed incision; still some scabbing inferiorly     Focus on Therapeutic Outcomes (FOTO)   Shoulder: 49 (51% limited, 31% predicted)      Sensation   Light Touch  Appears Intact      Coordination   Gross Motor Movements are Fluid and Coordinated  Yes      Posture/Postural Control   Posture/Postural Control  Postural limitations    Postural Limitations  Rounded Shoulders;Forward head;Increased thoracic kyphosis      ROM / Strength   AROM / PROM / Strength  AROM;PROM;Strength      AROM   AROM Assessment Site  Shoulder    Right/Left Shoulder  Right    Right Shoulder Flexion  152 Degrees   severe pain   Right Shoulder ABduction  138 Degrees   severe pain   Right Shoulder Internal Rotation  --   FIR L1; severe pain   Right Shoulder External Rotation  --   FER T2     PROM   PROM Assessment Site  Shoulder    Right/Left Shoulder  Left    Left Shoulder Flexion  104 Degrees    Left Shoulder ABduction  83 Degrees   pain   Left Shoulder Internal Rotation  55 Degrees   pain   Left Shoulder External Rotation  44 Degrees      Strength   Strength Assessment Site  Shoulder    Right/Left Shoulder  Right;Left    Right Shoulder Flexion  4+/5    Right Shoulder ABduction  4+/5    Right Shoulder Internal Rotation  4/5   pain    Right Shoulder External Rotation  4+/5      Palpation   Palpation comment  TTP and palpable soft tissue restriction over L proximal biceps muscle belly  and infra/teres group                Objective measurements completed on examination: See above findings.              PT Education - 05/03/19 1408    Education Details  prognosis, POC, HEP; edu on post-op precautions    Person(s) Educated  Patient    Methods  Explanation;Demonstration;Tactile cues;Handout;Verbal cues    Comprehension  Verbalized understanding;Returned demonstration       PT Short Term Goals - 05/03/19 1414      PT SHORT TERM GOAL #1   Title  Patient to be independent with initial HEP.    Time  3    Period  Weeks    Status  New    Target Date  05/24/19        PT Long Term Goals - 05/03/19 1414      PT LONG TERM GOAL #1   Title  Patient to be independent with advanced HEP.    Time  8    Period  Weeks    Status  New    Target Date  06/28/19      PT LONG TERM GOAL #2   Title  Patient to demonstrate L shoulder AROM/PROM [facility] and without pain limiting.    Time  8    Period  Weeks    Status  New    Target Date  06/28/19      PT LONG TERM GOAL #3   Title  Patient to demonstrate L shoulder strength >/=4+/5.    Time  8    Period  Weeks    Status  New    Target Date  06/28/19      PT LONG TERM GOAL #4   Title  Patient to report 80% improvement in ability to perform self care activities such as doing hair and pulling on pants.    Time  8    Period  Weeks    Status  New    Target Date  06/28/19      PT LONG TERM GOAL #5   Title  Patient to return to paddle boarding with modifications as needed.    Time  8    Period  Weeks    Status  New    Target Date  06/28/19             Plan - 05/03/19 1408    Clinical Impression Statement  Patient is a 64y/o F presenting to OPPT with c/o L shoulder and arm pain s/p L TSA on 03/13/19. Pain occurs down the L biceps and is worse in  AM. Also notes difficulty with opening jars, pulling/pushing to open/close the car door, reaching up to do her hair, pulling on pants. Patient would like to return to paddle boarding and must be able to push her mother in her mobility chair as she is her caregiver, however her L shoulder is preventing her from doing these things currently. Patient today presenting with rounded and forward head posture, limited and painful L shoulder PROM, and TTP palpable soft tissue restriction over L proximal biceps muscle belly and infra/teres group. R shoulder also very painful with AROM today- patient endorses hx of R shoulder pain as well. Patient educated on post-op precautions and AAROM HEP- patient reported understanding. Would benefit from skilled PT services 2x/week for 8 weeks to address aforementioned impairments.    Personal Factors and Comorbidities  Age;Comorbidity 3+;Past/Current Experience;Profession;Time since  onset of injury/illness/exacerbation    Comorbidities  raynaud's, prosthetic eye globe, osteoporosis, HA, depression, ACDF C5-6. C6-7. C7-T1 2016    Examination-Activity Limitations  Bathing;Sleep;Bed Mobility;Caring for Others;Carry;Toileting;Dressing;Hygiene/Grooming;Lift;Reach Overhead;Self Feeding    Examination-Participation Restrictions  Cleaning;Shop;Community Activity;Driving;Yard Work;Interpersonal Relationship;Laundry;Meal Prep    Stability/Clinical Decision Making  Stable/Uncomplicated    Clinical Decision Making  Low    Rehab Potential  Good    PT Frequency  2x / week    PT Duration  8 weeks    PT Treatment/Interventions  ADLs/Self Care Home Management;Cryotherapy;Electrical Stimulation;Moist Heat;Therapeutic exercise;Therapeutic activities;Functional mobility training;Ultrasound;Neuromuscular re-education;Patient/family education;Manual techniques;Vasopneumatic Device;Taping;Energy conservation;Dry needling;Passive range of motion;Scar mobilization    PT Next Visit Plan  reassess HEP;  progress per protocol according to tolerance    Consulted and Agree with Plan of Care  Patient       Patient will benefit from skilled therapeutic intervention in order to improve the following deficits and impairments:  Decreased scar mobility, Increased edema, Decreased activity tolerance, Decreased strength, Pain, Impaired UE functional use, Increased fascial restricitons, Increased muscle spasms, Improper body mechanics, Decreased range of motion, Impaired flexibility, Postural dysfunction  Visit Diagnosis: Acute pain of left shoulder  Stiffness of left shoulder, not elsewhere classified  Muscle weakness (generalized)  Abnormal posture     Problem List Patient Active Problem List   Diagnosis Date Noted  . Raynaud's disease without gangrene 02/10/2019  . Acute pain of left knee 03/23/2018  . Gallstones 01/07/2018  . ADHD (attention deficit hyperactivity disorder), inattentive type 11/24/2016  . Onychomycosis due to dermatophyte 08/23/2015  . Arthralgia of multiple joints 06/28/2015  . Liver fibrosis 04/02/2015  . Radiculopathy 12/12/2014  . Anemia due to other cause 10/11/2014  . Substance abuse in remission ([place]) 08/28/2014  . Chronic hepatitis C without hepatic coma ([place]) 07/06/2014  . MRSA PNEUMONIA 10/01/2008  . MUSCULOSKELETAL PAIN 09/27/2008  . ALLERGIC RHINITIS 04/12/2008  . CELLULITIS AND ABSCESS OF FACE 04/12/2008     Janene Harvey, PT, DPT 05/03/19 2:21 PM   [facility] [address]  [address] [place], [place], [address] Phone: [phone]   Fax:  [phone]  Name: Neira Butson MRN: [id number] Date of Birth: Jun 01, 1955

## 2019-05-09 ENCOUNTER — Encounter: Payer: Self-pay | Admitting: Physical Therapy

## 2019-05-09 ENCOUNTER — Other Ambulatory Visit: Payer: Self-pay

## 2019-05-09 ENCOUNTER — Ambulatory Visit: Payer: 59 | Admitting: Physical Therapy

## 2019-05-09 DIAGNOSIS — R293 Abnormal posture: Secondary | ICD-10-CM | POA: Diagnosis not present

## 2019-05-09 DIAGNOSIS — M25512 Pain in left shoulder: Secondary | ICD-10-CM | POA: Diagnosis not present

## 2019-05-09 DIAGNOSIS — M6281 Muscle weakness (generalized): Secondary | ICD-10-CM

## 2019-05-09 DIAGNOSIS — M25612 Stiffness of left shoulder, not elsewhere classified: Secondary | ICD-10-CM | POA: Diagnosis not present

## 2019-05-09 MED FILL — HYDROCORTISONE 2.5% OINT: 2.5 | 14 days supply | Qty: 28 | Fill #0

## 2019-05-09 NOTE — Therapy (Signed)
[facility] [place], [place], [address] Phone: [phone]   Fax:  [phone]  Physical Therapy Treatment  Patient Details  Name: [name] MRN: [id number] Date of Birth: 1955-01-30 Referring Provider (PT): Grier Mitts, [place]   Encounter Date: 05/09/2019  PT End of Session - 05/09/19 1637    Visit Number  2    Number of Visits  17    Date for PT Re-Evaluation  06/28/19    Authorization Type  [facility]    PT Start Time  1600    PT Stop Time  1650    PT Time Calculation (min)  50 min    Activity Tolerance  Patient tolerated treatment well    Behavior During Therapy  [facility] for tasks assessed/performed       Past Medical History:  Diagnosis Date  . ARDS (adult respiratory distress syndrome) ([place]) 2007  . Arthritis   . Depression   . Gallstones 01/07/2018  . Ganglion cyst of dorsum of right wrist   . Headache   . MRSA (methicillin resistant Staphylococcus aureus) 2007  . Osteoporosis   . Pneumonia 2009  . Prosthetic eye globe    right eye   . Raynaud's disease     Past Surgical History:  Procedure Laterality Date  . ANTERIOR CERVICAL DECOMP/DISCECTOMY FUSION N/A 12/12/2014   Procedure: ANTERIOR CERVICAL DECOMPRESSION/DISCECTOMY FUSION 3 LEVELS;  Surgeon: Phylliss Bob, MD;  Location: [facility];  Service: Orthopedics;  Laterality: N/A;  Anterior cervical decompression fusion, cerivcal 5-6, cervical 6-7, cervical 7-thoracic 1 with instrumentation and allografrt  . APPENDECTOMY  1974  . CATARACT EXTRACTION Left 11/2018  . CHOLECYSTECTOMY N/A 01/07/2018   Procedure: LAPAROSCOPIC CHOLECYSTECTOMY WITH INTRAOPERATIVE CHOLANGIOGRAM;  Surgeon: Fanny Skates, MD;  Location: [facility];  Service: General;  Laterality: N/A;  . COLONOSCOPY  04/14/2013   per Dr. Ardis Hughs, clear, repeat in 10 yrs   . CORNEAL TRANSPLANT Right 2007   x4  . ENUCLEATION Right 2013   eye infection  . GANGLION CYST EXCISION  Right 05/25/2016   Procedure: RIGHT WRIST ULNAR GANGLION EXCISION;  Surgeon: Milly Jakob, MD;  Location: [facility];  Service: Orthopedics;  Laterality: Right;  . RHINOPLASTY  1980  . TONSILLECTOMY     removed in 3rd grade  . TOTAL SHOULDER ARTHROPLASTY Left 03/13/2019   Procedure: TOTAL SHOULDER ARTHROPLASTY;  Surgeon: Tania Ade, MD;  Location: [facility] [facility];  Service: Orthopedics;  Laterality: Left;    There were no vitals filed for this visit.  Subjective Assessment - 05/09/19 1605    Subjective  Shoulder still hurts, HEP is going good    Pertinent History  raynaud's, prosthetic eye globe, osteoporosis, HA, depression, ACDF C5-6. C6-7. C7-T1 2016    Currently in Pain?  Yes    Pain Score  2     Pain Location  Shoulder    Pain Orientation  Left                       [facility] Adult PT Treatment/Exercise - 05/09/19 0001      Exercises   Exercises  Shoulder      Shoulder Exercises: Seated   Retraction  20 reps;Both    Other Seated Exercises  Pronation & supination 2lb 2x10     Other Seated Exercises  Shrugs 2x15      Shoulder Exercises: Isometric Strengthening   Flexion  3X5"    Extension  3X5"    External Rotation  3X5"    Internal Rotation  3X5"      Modalities   Modalities  Cryotherapy      Cryotherapy   Number Minutes Cryotherapy  10 Minutes    Cryotherapy Location  Shoulder    Type of Cryotherapy  Ice pack      Manual Therapy   Manual Therapy  Soft tissue mobilization;Passive ROM    Soft tissue mobilization  scar mobs    Passive ROM  L shpoulder all direction                PT Short Term Goals - 05/09/19 1637      PT SHORT TERM GOAL #1   Title  Patient to be independent with initial HEP.    Status  Achieved        PT Long Term Goals - 05/03/19 1414      PT LONG TERM GOAL #1   Title  Patient to be independent with advanced HEP.    Time  8    Period  Weeks    Status  New    Target Date   06/28/19      PT LONG TERM GOAL #2   Title  Patient to demonstrate L shoulder AROM/PROM [facility] and without pain limiting.    Time  8    Period  Weeks    Status  New    Target Date  06/28/19      PT LONG TERM GOAL #3   Title  Patient to demonstrate L shoulder strength >/=4+/5.    Time  8    Period  Weeks    Status  New    Target Date  06/28/19      PT LONG TERM GOAL #4   Title  Patient to report 80% improvement in ability to perform self care activities such as doing hair and pulling on pants.    Time  8    Period  Weeks    Status  New    Target Date  06/28/19      PT LONG TERM GOAL #5   Title  Patient to return to paddle boarding with modifications as needed.    Time  8    Period  Weeks    Status  New    Target Date  06/28/19            Plan - 05/09/19 1640    Clinical Impression Statement  Pt tolerated today's session well evident by no subjective pain to TE. Review HEP pt verbalized understanding although she reports not doing her HEP as much as she would like. She did well with all active interventions. Pain reported at the end range of PROM. Some guarding noted requiring cues to relax.    Personal Factors and Comorbidities  Age;Comorbidity 3+;Past/Current Experience;Profession;Time since onset of injury/illness/exacerbation    Comorbidities  raynaud's, prosthetic eye globe, osteoporosis, HA, depression, ACDF C5-6. C6-7. C7-T1 2016    Examination-Activity Limitations  Bathing;Sleep;Bed Mobility;Caring for Others;Carry;Toileting;Dressing;Hygiene/Grooming;Lift;Reach Overhead;Self Feeding    Examination-Participation Restrictions  Cleaning;Shop;Community Activity;Driving;Yard Work;Interpersonal Relationship;Laundry;Meal Prep    Stability/Clinical Decision Making  Stable/Uncomplicated    Rehab Potential  Good    PT Frequency  2x / week    PT Duration  8 weeks    PT Treatment/Interventions  ADLs/Self Care Home Management;Cryotherapy;Electrical Stimulation;Moist  Heat;Therapeutic exercise;Therapeutic activities;Functional mobility training;Ultrasound;Neuromuscular re-education;Patient/family education;Manual techniques;Vasopneumatic Device;Taping;Energy conservation;Dry needling;Passive range of motion;Scar mobilization    PT Next Visit Plan  progress per protocol according to tolerance       Patient will benefit from skilled therapeutic intervention in order to improve the following deficits and impairments:  Decreased scar mobility, Increased edema, Decreased activity tolerance, Decreased strength, Pain, Impaired UE functional use, Increased fascial restricitons, Increased muscle spasms, Improper body mechanics, Decreased range of motion, Impaired flexibility, Postural dysfunction  Visit Diagnosis: Stiffness of left shoulder, not elsewhere classified  Muscle weakness (generalized)  Acute pain of left shoulder     Problem List Patient Active Problem List   Diagnosis Date Noted  . Raynaud's disease without gangrene 02/10/2019  . Acute pain of left knee 03/23/2018  . Gallstones 01/07/2018  . ADHD (attention deficit hyperactivity disorder), inattentive type 11/24/2016  . Onychomycosis due to dermatophyte 08/23/2015  . Arthralgia of multiple joints 06/28/2015  . Liver fibrosis 04/02/2015  . Radiculopathy 12/12/2014  . Anemia due to other cause 10/11/2014  . Substance abuse in remission ([place]) 08/28/2014  . Chronic hepatitis C without hepatic coma ([place]) 07/06/2014  . MRSA PNEUMONIA 10/01/2008  . MUSCULOSKELETAL PAIN 09/27/2008  . ALLERGIC RHINITIS 04/12/2008  . CELLULITIS AND ABSCESS OF FACE 04/12/2008    Scot Jun, PTA 05/09/2019, 4:42 PM  [facility] [place] [address] [employer] [place], [place], [address] Phone: [phone]   Fax:  [phone]  Name: [name] MRN: [id number] Date of Birth: 11/11/1955

## 2019-05-10 MED FILL — VERAPAMIL ER 120 MG TABLET: 120 | 30 days supply | Qty: 30 | Fill #3

## 2019-05-11 ENCOUNTER — Other Ambulatory Visit: Payer: Self-pay

## 2019-05-11 ENCOUNTER — Ambulatory Visit: Payer: 59 | Admitting: Physical Therapy

## 2019-05-11 DIAGNOSIS — M6281 Muscle weakness (generalized): Secondary | ICD-10-CM

## 2019-05-11 DIAGNOSIS — M25612 Stiffness of left shoulder, not elsewhere classified: Secondary | ICD-10-CM

## 2019-05-11 DIAGNOSIS — M25512 Pain in left shoulder: Secondary | ICD-10-CM | POA: Diagnosis not present

## 2019-05-11 DIAGNOSIS — R293 Abnormal posture: Secondary | ICD-10-CM | POA: Diagnosis not present

## 2019-05-11 NOTE — Therapy (Signed)
[place], [place], [address] Phone: [phone]   Fax:  [phone]  Physical Therapy Treatment  Patient Details  Name: [name] MRN: [id number] Date of Birth: 06-03-1955 Referring Provider (PT): Grier Mitts, [place]   Encounter Date: 05/11/2019  PT End of Session - 05/11/19 0947    PT Start Time  0845    PT Stop Time  0945    PT Time Calculation (min)  60 min       Past Medical History:  Diagnosis Date  . ARDS (adult respiratory distress syndrome) ([facility]) 2007  . Arthritis   . Depression   . Gallstones 01/07/2018  . Ganglion cyst of dorsum of right wrist   . Headache   . MRSA (methicillin resistant Staphylococcus aureus) 2007  . Osteoporosis   . Pneumonia 2009  . Prosthetic eye globe    right eye   . Raynaud's disease     Past Surgical History:  Procedure Laterality Date  . ANTERIOR CERVICAL DECOMP/DISCECTOMY FUSION N/A 12/12/2014   Procedure: ANTERIOR CERVICAL DECOMPRESSION/DISCECTOMY FUSION 3 LEVELS;  Surgeon: Phylliss Bob, MD;  Location: [facility];  Service: Orthopedics;  Laterality: N/A;  Anterior cervical decompression fusion, cerivcal 5-6, cervical 6-7, cervical 7-thoracic 1 with instrumentation and allografrt  . APPENDECTOMY  1974  . CATARACT EXTRACTION Left 11/2018  . CHOLECYSTECTOMY N/A 01/07/2018   Procedure: LAPAROSCOPIC CHOLECYSTECTOMY WITH INTRAOPERATIVE CHOLANGIOGRAM;  Surgeon: Fanny Skates, MD;  Location: [facility];  Service: General;  Laterality: N/A;  . COLONOSCOPY  04/14/2013   per Dr. Ardis Hughs, clear, repeat in 10 yrs   . CORNEAL TRANSPLANT Right 2007   x4  . ENUCLEATION Right 2013   eye infection  . GANGLION CYST EXCISION Right 05/25/2016   Procedure: RIGHT WRIST ULNAR GANGLION EXCISION;  Surgeon: Milly Jakob, MD;  Location: [facility];  Service: Orthopedics;  Laterality: Right;  . RHINOPLASTY  1980  . TONSILLECTOMY     removed in  3rd grade  . TOTAL SHOULDER ARTHROPLASTY Left 03/13/2019   Procedure: TOTAL SHOULDER ARTHROPLASTY;  Surgeon: Tania Ade, MD;  Location: [facility];  Service: Orthopedics;  Laterality: Left;    There were no vitals filed for this visit.  Subjective Assessment - 05/11/19 0851    Subjective  Doing pretty good, reported pain on Nustep warm up    Pertinent History  raynaud's, prosthetic eye globe, osteoporosis, HA, depression, ACDF C5-6. C6-7. C7-T1 2016    Limitations  Reading;Lifting;House hold activities;Writing    Diagnostic tests  none recent    Patient Stated Goals  "I want to be able to use my arm again"    Currently in Pain?  Yes    Pain Score  3     Pain Location  Shoulder    Pain Orientation  Left;Anterior    Pain Descriptors / Indicators  Aching                       [facility] Adult PT Treatment/Exercise - 05/11/19 0001      Exercises   Exercises  Shoulder      Shoulder Exercises: Seated   Extension  Strengthening;Both;20 reps    Theraband Level (Shoulder Extension)  Level 1 (Yellow)    Retraction  20 reps;Both    Other Seated Exercises  Pronation & supination 2lb 2x10     Other Seated Exercises  Ulnar and radial deviation   2#, 2 sets of 10  reps     Shoulder Exercises: Isometric Strengthening   Flexion  3X5"    Extension  3X5"    External Rotation  3X5"    Internal Rotation  3X5"      Modalities   Modalities  Cryotherapy      Cryotherapy   Number Minutes Cryotherapy  10 Minutes    Cryotherapy Location  Shoulder    Type of Cryotherapy  Ice pack      Manual Therapy   Manual Therapy  Passive ROM    Passive ROM  L shld all motions               PT Short Term Goals - 05/11/19 0947      PT SHORT TERM GOAL #1   Title  Patient to be independent with initial HEP.    Time  3    Period  Weeks    Status  Achieved    Target Date  05/24/19        PT Long Term Goals - 05/11/19 0947      PT LONG TERM GOAL #1   Title   Patient to be independent with advanced HEP.    Time  8    Period  Weeks    Status  New      PT LONG TERM GOAL #2   Title  Patient to demonstrate L shoulder AROM/PROM WFL and without pain limiting.    Time  8    Period  Weeks    Status  New      PT LONG TERM GOAL #3   Title  Patient to demonstrate L shoulder strength >/=4+/5.    Time  8    Period  Weeks    Status  New      PT LONG TERM GOAL #4   Title  Patient to report 80% improvement in ability to perform self care activities such as doing hair and pulling on pants.    Time  8    Period  Weeks    Status  New      PT LONG TERM GOAL #5   Title  Patient to return to paddle boarding with modifications as needed.    Time  8    Period  Weeks    Status  New            Plan - 05/11/19 0939    Clinical Impression Statement  Pt stated increase in pain during the PROM to the L shld, she continues to guard her L shld during the PROM,  she verbalized exercises from her HEP with min asisit, she also needed much cues to relax her shoulder during the rows and sholder extension.    Personal Factors and Comorbidities  Age;Comorbidity 3+;Past/Current Experience;Profession;Time since onset of injury/illness/exacerbation    Comorbidities  raynaud's, prosthetic eye globe, osteoporosis, HA, depression, ACDF C5-6. C6-7. C7-T1 2016    Examination-Activity Limitations  Bathing;Sleep;Bed Mobility;Caring for Others;Carry;Toileting;Dressing;Hygiene/Grooming;Lift;Reach Overhead;Self Feeding    Examination-Participation Restrictions  Cleaning;Shop;Community Activity;Driving;Yard Work;Interpersonal Relationship;Laundry;Meal Prep    Stability/Clinical Decision Making  Stable/Uncomplicated    Rehab Potential  Good    PT Frequency  2x / week    PT Duration  8 weeks    PT Treatment/Interventions  ADLs/Self Care Home Management;Cryotherapy;Electrical Stimulation;Moist Heat;Therapeutic exercise;Therapeutic activities;Functional mobility  training;Ultrasound;Neuromuscular re-education;Patient/family education;Manual techniques;Vasopneumatic Device;Taping;Energy conservation;Dry needling;Passive range of motion;Scar mobilization    PT Next Visit Plan  progress per protocol according to tolerance       Patient  will benefit from skilled therapeutic intervention in order to improve the following deficits and impairments:  Decreased scar mobility, Increased edema, Decreased activity tolerance, Decreased strength, Pain, Impaired UE functional use, Increased fascial restricitons, Increased muscle spasms, Improper body mechanics, Decreased range of motion, Impaired flexibility, Postural dysfunction  Visit Diagnosis: Stiffness of left shoulder, not elsewhere classified  Muscle weakness (generalized)  Acute pain of left shoulder  Abnormal posture     Problem List Patient Active Problem List   Diagnosis Date Noted  . Raynaud's disease without gangrene 02/10/2019  . Acute pain of left knee 03/23/2018  . Gallstones 01/07/2018  . ADHD (attention deficit hyperactivity disorder), inattentive type 11/24/2016  . Onychomycosis due to dermatophyte 08/23/2015  . Arthralgia of multiple joints 06/28/2015  . Liver fibrosis 04/02/2015  . Radiculopathy 12/12/2014  . Anemia due to other cause 10/11/2014  . Substance abuse in remission ([facility]) 08/28/2014  . Chronic hepatitis C without hepatic coma ([facility]) 07/06/2014  . MRSA PNEUMONIA 10/01/2008  . MUSCULOSKELETAL PAIN 09/27/2008  . ALLERGIC RHINITIS 04/12/2008  . CELLULITIS AND ABSCESS OF FACE 04/12/2008    Clarene Essex, SPTA 05/11/2019, 9:52 AM  [address] [place], [place], [address] Phone: [phone]   Fax:  [phone]  Name: Damarys Lockrem MRN: [id number] Date of Birth: 1955/03/08

## 2019-05-16 ENCOUNTER — Encounter: Payer: Self-pay | Admitting: Physical Therapy

## 2019-05-16 ENCOUNTER — Ambulatory Visit: Payer: 59 | Admitting: Physical Therapy

## 2019-05-16 ENCOUNTER — Other Ambulatory Visit: Payer: Self-pay

## 2019-05-16 DIAGNOSIS — M25612 Stiffness of left shoulder, not elsewhere classified: Secondary | ICD-10-CM

## 2019-05-16 DIAGNOSIS — R293 Abnormal posture: Secondary | ICD-10-CM | POA: Diagnosis not present

## 2019-05-16 DIAGNOSIS — M25512 Pain in left shoulder: Secondary | ICD-10-CM | POA: Diagnosis not present

## 2019-05-16 DIAGNOSIS — M6281 Muscle weakness (generalized): Secondary | ICD-10-CM | POA: Diagnosis not present

## 2019-05-16 NOTE — Therapy (Signed)
[place], [place], [address] Phone: [phone]   Fax:  [phone]  Physical Therapy Treatment  Patient Details  Name: [name] MRN: [id number] Date of Birth: 1956/01/19 Referring Provider (PT): Grier Mitts, PA-C   Encounter Date: 05/16/2019  PT End of Session - 05/16/19 1742    Visit Number  4    Date for PT Re-Evaluation  06/28/19    Authorization Type  [facility]    PT Start Time  1605    PT Stop Time  1650    PT Time Calculation (min)  45 min    Activity Tolerance  Patient tolerated treatment well    Behavior During Therapy  [facility] for tasks assessed/performed       Past Medical History:  Diagnosis Date  . ARDS (adult respiratory distress syndrome) ([facility]) 2007  . Arthritis   . Depression   . Gallstones 01/07/2018  . Ganglion cyst of dorsum of right wrist   . Headache   . MRSA (methicillin resistant Staphylococcus aureus) 2007  . Osteoporosis   . Pneumonia 2009  . Prosthetic eye globe    right eye   . Raynaud's disease     Past Surgical History:  Procedure Laterality Date  . ANTERIOR CERVICAL DECOMP/DISCECTOMY FUSION N/A 12/12/2014   Procedure: ANTERIOR CERVICAL DECOMPRESSION/DISCECTOMY FUSION 3 LEVELS;  Surgeon: Phylliss Bob, MD;  Location: [facility];  Service: Orthopedics;  Laterality: N/A;  Anterior cervical decompression fusion, cerivcal 5-6, cervical 6-7, cervical 7-thoracic 1 with instrumentation and allografrt  . APPENDECTOMY  1974  . CATARACT EXTRACTION Left 11/2018  . CHOLECYSTECTOMY N/A 01/07/2018   Procedure: LAPAROSCOPIC CHOLECYSTECTOMY WITH INTRAOPERATIVE CHOLANGIOGRAM;  Surgeon: Fanny Skates, MD;  Location: [facility];  Service: General;  Laterality: N/A;  . COLONOSCOPY  04/14/2013   per Dr. Ardis Hughs, clear, repeat in 10 yrs   . CORNEAL TRANSPLANT Right 2007   x4  . ENUCLEATION Right 2013   eye infection  . GANGLION CYST EXCISION Right 05/25/2016   Procedure: RIGHT WRIST ULNAR GANGLION EXCISION;  Surgeon: Milly Jakob, MD;  Location: [facility];  Service: Orthopedics;  Laterality: Right;  . RHINOPLASTY  1980  . TONSILLECTOMY     removed in 3rd grade  . TOTAL SHOULDER ARTHROPLASTY Left 03/13/2019   Procedure: TOTAL SHOULDER ARTHROPLASTY;  Surgeon: Tania Ade, MD;  Location: [facility];  Service: Orthopedics;  Laterality: Left;    There were no vitals filed for this visit.  Subjective Assessment - 05/16/19 1620    Subjective  I am doing pretty good, less pain, seems to be moving better    Currently in Pain?  Yes    Pain Score  1     Pain Location  Shoulder    Pain Orientation  Left    Aggravating Factors   putting on seatbelt                       [facility] Adult PT Treatment/Exercise - 05/16/19 0001      Shoulder Exercises: Standing   External Rotation  Left;Theraband;20 reps    Theraband Level (Shoulder External Rotation)  Level 1 (Yellow)    Internal Rotation  Left;Theraband;20 reps    Theraband Level (Shoulder Internal Rotation)  Level 1 (Yellow)    Extension  20 reps;Theraband    Theraband Level (Shoulder Extension)  Level 1 (Yellow)    Row  Both;20 reps;Theraband    Theraband Level (Shoulder  Row)  Level 1 (Yellow)    Other Standing Exercises  wand exercises with assist all motions      Shoulder Exercises: ROM/Strengthening   UBE (Upper Arm Bike)  level 4 x 5 minutes    [place] Wash  flexion, CW/CCW circles      Manual Therapy   Manual Therapy  Passive ROM    Soft tissue mobilization  gentle scar mobilization    Passive ROM  L shld all motions               PT Short Term Goals - 05/11/19 0947      PT SHORT TERM GOAL #1   Title  Patient to be independent with initial HEP.    Time  3    Period  Weeks    Status  Achieved    Target Date  05/24/19        PT Long Term Goals - 05/11/19 0947      PT LONG TERM GOAL #1   Title  Patient to be independent  with advanced HEP.    Time  8    Period  Weeks    Status  New      PT LONG TERM GOAL #2   Title  Patient to demonstrate L shoulder AROM/PROM WFL and without pain limiting.    Time  8    Period  Weeks    Status  New      PT LONG TERM GOAL #3   Title  Patient to demonstrate L shoulder strength >/=4+/5.    Time  8    Period  Weeks    Status  New      PT LONG TERM GOAL #4   Title  Patient to report 80% improvement in ability to perform self care activities such as doing hair and pulling on pants.    Time  8    Period  Weeks    Status  New      PT LONG TERM GOAL #5   Title  Patient to return to paddle boarding with modifications as needed.    Time  8    Period  Weeks    Status  New            Plan - 05/16/19 1748    Clinical Impression Statement  Patient overall seems to be doing well, she is moving well with some compensation of the upper trap, during PROM I noticed that the scapula was very tight, she reported pain with PROM, has good ER/IR but tight and tender with flexion    PT Next Visit Plan  progress per protocol according to tolerance    Consulted and Agree with Plan of Care  Patient       Patient will benefit from skilled therapeutic intervention in order to improve the following deficits and impairments:  Decreased scar mobility, Increased edema, Decreased activity tolerance, Decreased strength, Pain, Impaired UE functional use, Increased fascial restricitons, Increased muscle spasms, Improper body mechanics, Decreased range of motion, Impaired flexibility, Postural dysfunction  Visit Diagnosis: Stiffness of left shoulder, not elsewhere classified  Muscle weakness (generalized)  Acute pain of left shoulder  Abnormal posture     Problem List Patient Active Problem List   Diagnosis Date Noted  . Raynaud's disease without gangrene 02/10/2019  . Acute pain of left knee 03/23/2018  . Gallstones 01/07/2018  . ADHD (attention deficit hyperactivity  disorder), inattentive type 11/24/2016  . Onychomycosis due to dermatophyte 08/23/2015  .  Arthralgia of multiple joints 06/28/2015  . Liver fibrosis 04/02/2015  . Radiculopathy 12/12/2014  . Anemia due to other cause 10/11/2014  . Substance abuse in remission ([facility]) 08/28/2014  . Chronic hepatitis C without hepatic coma ([facility]) 07/06/2014  . MRSA PNEUMONIA 10/01/2008  . MUSCULOSKELETAL PAIN 09/27/2008  . ALLERGIC RHINITIS 04/12/2008  . CELLULITIS AND ABSCESS OF [place] 04/12/2008    Sumner Boast., PT 05/16/2019, 5:50 PM  [address] [place], [place], [address] Phone: [phone]   Fax:  [phone]  Name: Keylly Jahnke MRN: [id number] Date of Birth: 04/19/1955

## 2019-05-18 ENCOUNTER — Ambulatory Visit: Payer: 59 | Admitting: Physical Therapy

## 2019-05-18 ENCOUNTER — Other Ambulatory Visit: Payer: Self-pay

## 2019-05-18 DIAGNOSIS — M25612 Stiffness of left shoulder, not elsewhere classified: Secondary | ICD-10-CM

## 2019-05-18 DIAGNOSIS — R293 Abnormal posture: Secondary | ICD-10-CM

## 2019-05-18 DIAGNOSIS — M6281 Muscle weakness (generalized): Secondary | ICD-10-CM

## 2019-05-18 DIAGNOSIS — M25512 Pain in left shoulder: Secondary | ICD-10-CM | POA: Diagnosis not present

## 2019-05-18 NOTE — Therapy (Signed)
[place], [place], [address] Phone: [phone]   Fax:  [phone]  Physical Therapy Treatment  Patient Details  Name: [name] MRN: [id number] Date of Birth: April 25, 1955 Referring Provider (PT): Grier Mitts, [place]   Encounter Date: 05/18/2019  PT End of Session - 05/18/19 1712    Visit Number  3    Number of Visits  17    Date for PT Re-Evaluation  06/28/19    Authorization Type  [facility]    PT Start Time  1615    PT Stop Time  1708    PT Time Calculation (min)  53 min    Behavior During Therapy  [facility] for tasks assessed/performed       Past Medical History:  Diagnosis Date  . ARDS (adult respiratory distress syndrome) ([facility]) 2007  . Arthritis   . Depression   . Gallstones 01/07/2018  . Ganglion cyst of dorsum of right wrist   . Headache   . MRSA (methicillin resistant Staphylococcus aureus) 2007  . Osteoporosis   . Pneumonia 2009  . Prosthetic eye globe    right eye   . Raynaud's disease     Past Surgical History:  Procedure Laterality Date  . ANTERIOR CERVICAL DECOMP/DISCECTOMY FUSION N/A 12/12/2014   Procedure: ANTERIOR CERVICAL DECOMPRESSION/DISCECTOMY FUSION 3 LEVELS;  Surgeon: Phylliss Bob, MD;  Location: [facility];  Service: Orthopedics;  Laterality: N/A;  Anterior cervical decompression fusion, cerivcal 5-6, cervical 6-7, cervical 7-thoracic 1 with instrumentation and allografrt  . APPENDECTOMY  1974  . CATARACT EXTRACTION Left 11/2018  . CHOLECYSTECTOMY N/A 01/07/2018   Procedure: LAPAROSCOPIC CHOLECYSTECTOMY WITH INTRAOPERATIVE CHOLANGIOGRAM;  Surgeon: Fanny Skates, MD;  Location: [facility];  Service: General;  Laterality: N/A;  . COLONOSCOPY  04/14/2013   per Dr. Ardis Hughs, clear, repeat in 10 yrs   . CORNEAL TRANSPLANT Right 2007   x4  . ENUCLEATION Right 2013   eye infection  . GANGLION CYST EXCISION Right 05/25/2016   Procedure: RIGHT WRIST ULNAR GANGLION  EXCISION;  Surgeon: Milly Jakob, MD;  Location: [facility];  Service: Orthopedics;  Laterality: Right;  . RHINOPLASTY  1980  . TONSILLECTOMY     removed in 3rd grade  . TOTAL SHOULDER ARTHROPLASTY Left 03/13/2019   Procedure: TOTAL SHOULDER ARTHROPLASTY;  Surgeon: Tania Ade, MD;  Location: [facility];  Service: Orthopedics;  Laterality: Left;    There were no vitals filed for this visit.  Subjective Assessment - 05/18/19 1615    Subjective  she doing well, says she still can't move like I want to    Limitations  Reading;Lifting;House hold activities;Writing    Currently in Pain?  Yes    Pain Score  2     Pain Location  Shoulder    Pain Orientation  Left                       [facility] Adult PT Treatment/Exercise - 05/18/19 0001      Shoulder Exercises: Supine   Flexion  Strengthening;Weights   2# 2x10     Shoulder Exercises: Standing   External Rotation  Left;20 reps;Strengthening;Theraband    Theraband Level (Shoulder External Rotation)  Level 1 (Yellow)    Internal Rotation  AAROM;Both;10 reps   cane   Flexion  AAROM;Both;10 reps   cane   ABduction  AAROM;10 reps;Left   cane   Extension  AAROM;Both;10 reps   cane  Other Standing Exercises  shld flex w/ weight attempted   limited by weakness     Shoulder Exercises: ROM/Strengthening   UBE (Upper Arm Bike)  level 4 x 5 minutes    Wall Wash  flex, side to side, cricles both ways, 10x      Manual Therapy   Manual Therapy  Passive ROM    Soft tissue mobilization  STM to mid deltoid    Passive ROM  L shld all motions               PT Short Term Goals - 05/11/19 0947      PT SHORT TERM GOAL #1   Title  Patient to be independent with initial HEP.    Time  3    Period  Weeks    Status  Achieved    Target Date  05/24/19        PT Long Term Goals - 05/18/19 1717      PT LONG TERM GOAL #1   Title  Patient to be independent with advanced HEP.    Time  8     Period  Weeks    Status  On-going      PT LONG TERM GOAL #2   Title  Patient to demonstrate L shoulder AROM/PROM WFL and without pain limiting.    Time  8    Period  Weeks    Status  On-going      PT LONG TERM GOAL #3   Title  Patient to demonstrate L shoulder strength >/=4+/5.    Time  8    Period  Weeks    Status  On-going      PT LONG TERM GOAL #4   Title  Patient to report 80% improvement in ability to perform self care activities such as doing hair and pulling on pants.    Time  8    Period  Weeks    Status  On-going      PT LONG TERM GOAL #5   Title  Patient to return to paddle boarding with modifications as needed.    Time  8    Period  Weeks    Status  On-going            Plan - 05/18/19 1713    Clinical Impression Statement  Pt has trouble with relaxing the R shoulder during flex and abd, attempted shld flex w/ theraband was unable to complete full ROM due to muscle weakness, tenderness in the mid deltoid on L shld, shld remains stiff in all directions    Comorbidities  raynaud's, prosthetic eye globe, osteoporosis, HA, depression, ACDF C5-6. C6-7. C7-T1 2016    Examination-Activity Limitations  Bathing;Sleep;Bed Mobility;Caring for Others;Carry;Toileting;Dressing;Hygiene/Grooming;Lift;Reach Overhead;Self Feeding    Examination-Participation Restrictions  Cleaning;Shop;Community Activity;Driving;Yard Work;Interpersonal Relationship;Laundry;Meal Prep    Stability/Clinical Decision Making  Stable/Uncomplicated    PT Next Visit Plan  progress per protocol according to tolerance       Patient will benefit from skilled therapeutic intervention in order to improve the following deficits and impairments:  Decreased scar mobility, Increased edema, Decreased activity tolerance, Decreased strength, Pain, Impaired UE functional use, Increased fascial restricitons, Increased muscle spasms, Improper body mechanics, Decreased range of motion, Impaired flexibility, Postural  dysfunction  Visit Diagnosis: Stiffness of left shoulder, not elsewhere classified  Muscle weakness (generalized)  Acute pain of left shoulder  Abnormal posture     Problem List Patient Active Problem List   Diagnosis Date Noted  .  Raynaud's disease without gangrene 02/10/2019  . Acute pain of left knee 03/23/2018  . Gallstones 01/07/2018  . ADHD (attention deficit hyperactivity disorder), inattentive type 11/24/2016  . Onychomycosis due to dermatophyte 08/23/2015  . Arthralgia of multiple joints 06/28/2015  . Liver fibrosis 04/02/2015  . Radiculopathy 12/12/2014  . Anemia due to other cause 10/11/2014  . Substance abuse in remission ([place]) 08/28/2014  . Chronic hepatitis C without hepatic coma ([place]) 07/06/2014  . MRSA PNEUMONIA 10/01/2008  . MUSCULOSKELETAL PAIN 09/27/2008  . ALLERGIC RHINITIS 04/12/2008  . CELLULITIS AND ABSCESS OF FACE 04/12/2008    Clarene Essex, SPTA 05/18/2019, 5:23 PM  [place] [address] [place], [place], [address] Phone: [phone]   Fax:  [phone]  Name: Candela Desaulniers MRN: [id number] Date of Birth: 11-25-1955

## 2019-05-19 MED FILL — valACYclovir HCL 1 GM TABS: 1 | 90 days supply | Qty: 90 | Fill #3

## 2019-05-25 ENCOUNTER — Ambulatory Visit: Payer: 59 | Admitting: Physical Therapy

## 2019-05-25 ENCOUNTER — Other Ambulatory Visit: Payer: Self-pay

## 2019-05-25 DIAGNOSIS — R293 Abnormal posture: Secondary | ICD-10-CM | POA: Diagnosis not present

## 2019-05-25 DIAGNOSIS — M25512 Pain in left shoulder: Secondary | ICD-10-CM

## 2019-05-25 DIAGNOSIS — M6281 Muscle weakness (generalized): Secondary | ICD-10-CM | POA: Diagnosis not present

## 2019-05-25 DIAGNOSIS — M25612 Stiffness of left shoulder, not elsewhere classified: Secondary | ICD-10-CM | POA: Diagnosis not present

## 2019-05-25 NOTE — Therapy (Signed)
[place], [place], [address] Phone: [phone]   Fax:  [phone]  Physical Therapy Treatment  Patient Details  Name: [name] MRN: [id number] Date of Birth: 11/29/1955 Referring Provider (PT): Grier Mitts, [place]   Encounter Date: 05/25/2019  PT End of Session - 05/25/19 1747    Visit Number  4    Number of Visits  17    Date for PT Re-Evaluation  06/28/19    PT Start Time  1601    PT Stop Time  1648    PT Time Calculation (min)  47 min    Activity Tolerance  Patient tolerated treatment well    Behavior During Therapy  [facility] for tasks assessed/performed       Past Medical History:  Diagnosis Date  . ARDS (adult respiratory distress syndrome) ([place]) 2007  . Arthritis   . Depression   . Gallstones 01/07/2018  . Ganglion cyst of dorsum of right wrist   . Headache   . MRSA (methicillin resistant Staphylococcus aureus) 2007  . Osteoporosis   . Pneumonia 2009  . Prosthetic eye globe    right eye   . Raynaud's disease     Past Surgical History:  Procedure Laterality Date  . ANTERIOR CERVICAL DECOMP/DISCECTOMY FUSION N/A 12/12/2014   Procedure: ANTERIOR CERVICAL DECOMPRESSION/DISCECTOMY FUSION 3 LEVELS;  Surgeon: Phylliss Bob, MD;  Location: [place];  Service: Orthopedics;  Laterality: N/A;  Anterior cervical decompression fusion, cerivcal 5-6, cervical 6-7, cervical 7-thoracic 1 with instrumentation and allografrt  . APPENDECTOMY  1974  . CATARACT EXTRACTION Left 11/2018  . CHOLECYSTECTOMY N/A 01/07/2018   Procedure: LAPAROSCOPIC CHOLECYSTECTOMY WITH INTRAOPERATIVE CHOLANGIOGRAM;  Surgeon: Fanny Skates, MD;  Location: [place];  Service: General;  Laterality: N/A;  . COLONOSCOPY  04/14/2013   per Dr. Ardis Hughs, clear, repeat in 10 yrs   . CORNEAL TRANSPLANT Right 2007   x4  . ENUCLEATION Right 2013   eye infection  . GANGLION CYST EXCISION Right 05/25/2016   Procedure:  RIGHT WRIST ULNAR GANGLION EXCISION;  Surgeon: Milly Jakob, MD;  Location: [place];  Service: Orthopedics;  Laterality: Right;  . RHINOPLASTY  1980  . TONSILLECTOMY     removed in 3rd grade  . TOTAL SHOULDER ARTHROPLASTY Left 03/13/2019   Procedure: TOTAL SHOULDER ARTHROPLASTY;  Surgeon: Tania Ade, MD;  Location: [facility] [place];  Service: Orthopedics;  Laterality: Left;    There were no vitals filed for this visit.  Subjective Assessment - 05/25/19 1702    Subjective  says exercises make her shoulder feel better, she can reach to get her seatbelt    Currently in Pain?  No/denies    Pain Score  0-No pain         [facility] PT Assessment - 05/25/19 0001      PROM   Left Shoulder Flexion  120 Degrees    Left Shoulder ABduction  115 Degrees                   [facility] Adult PT Treatment/Exercise - 05/25/19 0001      Shoulder Exercises: Standing   Internal Rotation  Strengthening;Both;20 reps;Weights   cane w/ 1.5#   Flexion  Strengthening;Both;20 reps;Weights   cane w/ 1.5#   ABduction  AAROM;10 reps;Left    Extension  Strengthening;Both;20 reps;Weights   cane w/ 1.5#   Other Standing Exercises  shld abd and flex, 2x10, 1#    Other  Standing Exercises  IR, ER, yellow tband, 2x10      Shoulder Exercises: ROM/Strengthening   Nustep  L4, UE only 29min    Other ROM/Strengthening Exercises  shld ext, rows, 2x10, yellow tband               PT Short Term Goals - 05/11/19 0947      PT SHORT TERM GOAL #1   Title  Patient to be independent with initial HEP.    Time  3    Period  Weeks    Status  Achieved    Target Date  05/24/19        PT Long Term Goals - 05/25/19 1756      PT LONG TERM GOAL #1   Title  Patient to be independent with advanced HEP.    Status  Achieved      PT LONG TERM GOAL #2   Title  Patient to demonstrate L shoulder AROM/PROM [facility] and without pain limiting.    Status  On-going            Plan  - 05/25/19 1748    Clinical Impression Statement  Pt showed improvements in shld flex and abd AROM, she is still limited by strength in her L shld abd and flex, cues needed to keep her elbow in during ER and IR w/ tband, palpable tenderness still in the mid deltoid, pt tolerated treatment exercises well.    Examination-Activity Limitations  Bathing;Sleep;Bed Mobility;Caring for Others;Carry;Toileting;Dressing;Hygiene/Grooming;Lift;Reach Overhead;Self Feeding    PT Treatment/Interventions  ADLs/Self Care Home Management;Cryotherapy;Electrical Stimulation;Moist Heat;Therapeutic exercise;Therapeutic activities;Functional mobility training;Ultrasound;Neuromuscular re-education;Patient/family education;Manual techniques;Vasopneumatic Device;Taping;Energy conservation;Dry needling;Passive range of motion;Scar mobilization    PT Next Visit Plan  progress per protocol according to tolerance       Patient will benefit from skilled therapeutic intervention in order to improve the following deficits and impairments:  Decreased scar mobility, Increased edema, Decreased activity tolerance, Decreased strength, Pain, Impaired UE functional use, Increased fascial restricitons, Increased muscle spasms, Improper body mechanics, Decreased range of motion, Impaired flexibility, Postural dysfunction  Visit Diagnosis: Stiffness of left shoulder, not elsewhere classified  Acute pain of left shoulder  Muscle weakness (generalized)  Abnormal posture     Problem List Patient Active Problem List   Diagnosis Date Noted  . Raynaud's disease without gangrene 02/10/2019  . Acute pain of left knee 03/23/2018  . Gallstones 01/07/2018  . ADHD (attention deficit hyperactivity disorder), inattentive type 11/24/2016  . Onychomycosis due to dermatophyte 08/23/2015  . Arthralgia of multiple joints 06/28/2015  . Liver fibrosis 04/02/2015  . Radiculopathy 12/12/2014  . Anemia due to other cause 10/11/2014  . Substance  abuse in remission ([place]) 08/28/2014  . Chronic hepatitis C without hepatic coma ([place]) 07/06/2014  . MRSA PNEUMONIA 10/01/2008  . MUSCULOSKELETAL PAIN 09/27/2008  . ALLERGIC RHINITIS 04/12/2008  . CELLULITIS AND ABSCESS OF FACE 04/12/2008    Clarene Essex, SPTA 05/25/2019, 5:58 PM  [address] [place], [place], [address] Phone: [phone]   Fax:  [phone]  Name: [name] MRN: [id number] Date of Birth: August 11, 1955

## 2019-05-30 ENCOUNTER — Ambulatory Visit: Payer: 59 | Admitting: Physical Therapy

## 2019-05-30 MED FILL — oxyCODONE HCL 10 MG TABS: 10 | 30 days supply | Qty: 120 | Fill #0

## 2019-05-30 MED FILL — valACYclovir HCL 1 GM TABS: 1 | 90 days supply | Qty: 90 | Fill #3

## 2019-05-31 MED FILL — ADDERALL XR 20 MG CAP SA: 20 | 30 days supply | Qty: 30 | Fill #0

## 2019-06-01 ENCOUNTER — Ambulatory Visit: Payer: 59 | Attending: Surgical | Admitting: Physical Therapy

## 2019-06-01 ENCOUNTER — Other Ambulatory Visit: Payer: Self-pay

## 2019-06-01 DIAGNOSIS — M25512 Pain in left shoulder: Secondary | ICD-10-CM | POA: Insufficient documentation

## 2019-06-01 DIAGNOSIS — R293 Abnormal posture: Secondary | ICD-10-CM | POA: Diagnosis not present

## 2019-06-01 DIAGNOSIS — M25612 Stiffness of left shoulder, not elsewhere classified: Secondary | ICD-10-CM | POA: Diagnosis not present

## 2019-06-01 DIAGNOSIS — M6281 Muscle weakness (generalized): Secondary | ICD-10-CM | POA: Diagnosis not present

## 2019-06-01 NOTE — Therapy (Cosign Needed)
[place], [place], [address] Phone: [phone]   Fax:  [phone] During this treatment session, the therapist was present, participating in and directing the treatment.  Physical Therapy Treatment  Patient Details  Name: [name] MRN: [id number] Date of Birth: 1955-05-18 Referring Provider (PT): Grier Mitts, PA-C   Encounter Date: 06/01/2019  PT End of Session - 06/01/19 1746    Visit Number  5    Number of Visits  17    Date for PT Re-Evaluation  06/28/19    PT Start Time  1700    PT Stop Time  1745    PT Time Calculation (min)  45 min    Activity Tolerance  Patient tolerated treatment well    Behavior During Therapy  [facility] for tasks assessed/performed       Past Medical History:  Diagnosis Date  . ARDS (adult respiratory distress syndrome) ([place]) 2007  . Arthritis   . Depression   . Gallstones 01/07/2018  . Ganglion cyst of dorsum of right wrist   . Headache   . MRSA (methicillin resistant Staphylococcus aureus) 2007  . Osteoporosis   . Pneumonia 2009  . Prosthetic eye globe    right eye   . Raynaud's disease     Past Surgical History:  Procedure Laterality Date  . ANTERIOR CERVICAL DECOMP/DISCECTOMY FUSION N/A 12/12/2014   Procedure: ANTERIOR CERVICAL DECOMPRESSION/DISCECTOMY FUSION 3 LEVELS;  Surgeon: Phylliss Bob, MD;  Location: [place];  Service: Orthopedics;  Laterality: N/A;  Anterior cervical decompression fusion, cerivcal 5-6, cervical 6-7, cervical 7-thoracic 1 with instrumentation and allografrt  . APPENDECTOMY  1974  . CATARACT EXTRACTION Left 11/2018  . CHOLECYSTECTOMY N/A 01/07/2018   Procedure: LAPAROSCOPIC CHOLECYSTECTOMY WITH INTRAOPERATIVE CHOLANGIOGRAM;  Surgeon: Fanny Skates, MD;  Location: [facility];  Service: General;  Laterality: N/A;  . COLONOSCOPY  04/14/2013   per Dr. Ardis Hughs, clear, repeat in 10 yrs   . CORNEAL TRANSPLANT Right 2007    x4  . ENUCLEATION Right 2013   eye infection  . GANGLION CYST EXCISION Right 05/25/2016   Procedure: RIGHT WRIST ULNAR GANGLION EXCISION;  Surgeon: Milly Jakob, MD;  Location: [place];  Service: Orthopedics;  Laterality: Right;  . RHINOPLASTY  1980  . TONSILLECTOMY     removed in 3rd grade  . TOTAL SHOULDER ARTHROPLASTY Left 03/13/2019   Procedure: TOTAL SHOULDER ARTHROPLASTY;  Surgeon: Tania Ade, MD;  Location: [place];  Service: Orthopedics;  Laterality: Left;    There were no vitals filed for this visit.  Subjective Assessment - 06/01/19 1710    Subjective  says she overdid it yesterday while trying to lift a 1# bottle, reports a sharp pain at surgery site yesterday    Currently in Pain?  No/denies    Pain Score  0-No pain         [facility] PT Assessment - 06/01/19 0001      AROM   Right/Left Shoulder  Left    Left Shoulder Flexion  125 Degrees    Left Shoulder ABduction  115 Degrees                   [facility] Adult PT Treatment/Exercise - 06/01/19 0001      Shoulder Exercises: Standing   Flexion  Strengthening;Theraband;20 reps    Theraband Level (Shoulder Flexion)  Level 1 (Yellow)    Flexion Limitations  assist needed to complete full ROM  Other Standing Exercises  AAROM flex, abd, ext, IR, 10x   w/ cane   Other Standing Exercises  shld flex and abd 1#, 12 reps      Shoulder Exercises: ROM/Strengthening   UBE (Upper Arm Bike)  L3, 3 min back and 3 min fwd      Manual Therapy   Manual Therapy  Passive ROM    Passive ROM  L shld all motions               PT Short Term Goals - 05/11/19 0947      PT SHORT TERM GOAL #1   Title  Patient to be independent with initial HEP.    Time  3    Period  Weeks    Status  Achieved    Target Date  05/24/19        PT Long Term Goals - 06/01/19 1754      PT LONG TERM GOAL #2   Title  Patient to demonstrate L shoulder AROM/PROM [facility] and without pain limiting.     Status  Partially Met            Plan - 06/01/19 1747    Clinical Impression Statement  Pt c/o sharp pain at the surgery site, her shld flex and abd is still limited in strength, she needed assist to complete the full ROM during the abd weighted exercises, PROM IR is painful, breaks needed during man therapy.    PT Treatment/Interventions  ADLs/Self Care Home Management;Cryotherapy;Electrical Stimulation;Moist Heat;Therapeutic exercise;Therapeutic activities;Functional mobility training;Ultrasound;Neuromuscular re-education;Patient/family education;Manual techniques;Vasopneumatic Device;Taping;Energy conservation;Dry needling;Passive range of motion;Scar mobilization    PT Next Visit Plan  progress per protocol according to tolerance       Patient will benefit from skilled therapeutic intervention in order to improve the following deficits and impairments:  Decreased scar mobility, Increased edema, Decreased activity tolerance, Decreased strength, Pain, Impaired UE functional use, Increased fascial restricitons, Increased muscle spasms, Improper body mechanics, Decreased range of motion, Impaired flexibility, Postural dysfunction  Visit Diagnosis: Stiffness of left shoulder, not elsewhere classified  Acute pain of left shoulder  Abnormal posture  Muscle weakness (generalized)     Problem List Patient Active Problem List   Diagnosis Date Noted  . Raynaud's disease without gangrene 02/10/2019  . Acute pain of left knee 03/23/2018  . Gallstones 01/07/2018  . ADHD (attention deficit hyperactivity disorder), inattentive type 11/24/2016  . Onychomycosis due to dermatophyte 08/23/2015  . Arthralgia of multiple joints 06/28/2015  . Liver fibrosis 04/02/2015  . Radiculopathy 12/12/2014  . Anemia due to other cause 10/11/2014  . Substance abuse in remission ([place]) 08/28/2014  . Chronic hepatitis C without hepatic coma ([place]) 07/06/2014  . MRSA PNEUMONIA 10/01/2008  .  MUSCULOSKELETAL PAIN 09/27/2008  . ALLERGIC RHINITIS 04/12/2008  . CELLULITIS AND ABSCESS OF FACE 04/12/2008    Clarene Essex, SPTA 06/01/2019, 5:56 PM [address] [place], [place], [address] Phone: [phone]   Fax:  [phone]  Name: [name] MRN: [id number] Date of Birth: Sep 22, 1955

## 2019-06-03 MED FILL — CELECOXIB 200 MG CAP: 200 | 30 days supply | Qty: 60 | Fill #3

## 2019-06-05 ENCOUNTER — Other Ambulatory Visit: Payer: Self-pay

## 2019-06-05 ENCOUNTER — Encounter: Payer: Self-pay | Admitting: Physical Therapy

## 2019-06-05 ENCOUNTER — Ambulatory Visit: Payer: 59 | Admitting: Physical Therapy

## 2019-06-05 DIAGNOSIS — M25512 Pain in left shoulder: Secondary | ICD-10-CM

## 2019-06-05 DIAGNOSIS — M6281 Muscle weakness (generalized): Secondary | ICD-10-CM

## 2019-06-05 DIAGNOSIS — M25612 Stiffness of left shoulder, not elsewhere classified: Secondary | ICD-10-CM

## 2019-06-05 DIAGNOSIS — R293 Abnormal posture: Secondary | ICD-10-CM | POA: Diagnosis not present

## 2019-06-05 NOTE — Therapy (Signed)
[place], [place], [address] Phone: [phone]   Fax:  [phone]  Physical Therapy Treatment  Patient Details  Name: [name] MRN: [id number] Date of Birth: Aug 04, 1955 Referring Provider (PT): Grier Mitts, PA-C   Encounter Date: 06/05/2019  PT End of Session - 06/05/19 1747    Visit Number  6    Number of Visits  17    Date for PT Re-Evaluation  06/28/19    Authorization Type  [facility]    PT Start Time  1619    PT Stop Time  1700    PT Time Calculation (min)  41 min    Activity Tolerance  Patient tolerated treatment well    Behavior During Therapy  [facility] for tasks assessed/performed       Past Medical History:  Diagnosis Date  . ARDS (adult respiratory distress syndrome) ([place]) 2007  . Arthritis   . Depression   . Gallstones 01/07/2018  . Ganglion cyst of dorsum of right wrist   . Headache   . MRSA (methicillin resistant Staphylococcus aureus) 2007  . Osteoporosis   . Pneumonia 2009  . Prosthetic eye globe    right eye   . Raynaud's disease     Past Surgical History:  Procedure Laterality Date  . ANTERIOR CERVICAL DECOMP/DISCECTOMY FUSION N/A 12/12/2014   Procedure: ANTERIOR CERVICAL DECOMPRESSION/DISCECTOMY FUSION 3 LEVELS;  Surgeon: Phylliss Bob, MD;  Location: [place];  Service: Orthopedics;  Laterality: N/A;  Anterior cervical decompression fusion, cerivcal 5-6, cervical 6-7, cervical 7-thoracic 1 with instrumentation and allografrt  . APPENDECTOMY  1974  . CATARACT EXTRACTION Left 11/2018  . CHOLECYSTECTOMY N/A 01/07/2018   Procedure: LAPAROSCOPIC CHOLECYSTECTOMY WITH INTRAOPERATIVE CHOLANGIOGRAM;  Surgeon: Fanny Skates, MD;  Location: [place];  Service: General;  Laterality: N/A;  . COLONOSCOPY  04/14/2013   per Dr. Ardis Hughs, clear, repeat in 10 yrs   . CORNEAL TRANSPLANT Right 2007   x4  . ENUCLEATION Right 2013   eye infection  . GANGLION CYST EXCISION  Right 05/25/2016   Procedure: RIGHT WRIST ULNAR GANGLION EXCISION;  Surgeon: Milly Jakob, MD;  Location: [place];  Service: Orthopedics;  Laterality: Right;  . RHINOPLASTY  1980  . TONSILLECTOMY     removed in 3rd grade  . TOTAL SHOULDER ARTHROPLASTY Left 03/13/2019   Procedure: TOTAL SHOULDER ARTHROPLASTY;  Surgeon: Tania Ade, MD;  Location: [place];  Service: Orthopedics;  Laterality: Left;    There were no vitals filed for this visit.  Subjective Assessment - 06/05/19 1623    Subjective  Pt reports she is feeling better since last rx; reports stretching at end of session was very helpful.    Currently in Pain?  No/denies    Pain Score  0-No pain    Pain Location  Shoulder    Pain Orientation  Left                       [facility] Adult PT Treatment/Exercise - 06/05/19 0001      Shoulder Exercises: Standing   Other Standing Exercises  wall ladder L abduction x10    Other Standing Exercises  shoulder flexion/abduction with shelf taps 2x10      Shoulder Exercises: ROM/Strengthening   UBE (Upper Arm Bike)  L3, 3 min back and 3 min fwd    Ball on Wall  x10 3 sec hold at top    Other ROM/Strengthening  Exercises  ball overhead touching wall x10      Manual Therapy   Manual Therapy  Passive ROM    Passive ROM  L shld all motions               PT Short Term Goals - 05/11/19 0947      PT SHORT TERM GOAL #1   Title  Patient to be independent with initial HEP.    Time  3    Period  Weeks    Status  Achieved    Target Date  05/24/19        PT Long Term Goals - 06/01/19 1754      PT LONG TERM GOAL #2   Title  Patient to demonstrate L shoulder AROM/PROM [facility] and without pain limiting.    Status  Partially Met            Plan - 06/05/19 1748    Clinical Impression Statement  Pt continues to be limited in functional shoulder flexion and abduction ROM; progressed ex's this rx pt tolerated well. Pt reports  improvement with PROM/stretching ex's. Continue to progress next rx.    PT Treatment/Interventions  ADLs/Self Care Home Management;Cryotherapy;Electrical Stimulation;Moist Heat;Therapeutic exercise;Therapeutic activities;Functional mobility training;Ultrasound;Neuromuscular re-education;Patient/family education;Manual techniques;Vasopneumatic Device;Taping;Energy conservation;Dry needling;Passive range of motion;Scar mobilization    PT Next Visit Plan  progress per protocol according to tolerance    Consulted and Agree with Plan of Care  Patient       Patient will benefit from skilled therapeutic intervention in order to improve the following deficits and impairments:  Decreased scar mobility, Increased edema, Decreased activity tolerance, Decreased strength, Pain, Impaired UE functional use, Increased fascial restricitons, Increased muscle spasms, Improper body mechanics, Decreased range of motion, Impaired flexibility, Postural dysfunction  Visit Diagnosis: Stiffness of left shoulder, not elsewhere classified  Acute pain of left shoulder  Abnormal posture  Muscle weakness (generalized)     Problem List Patient Active Problem List   Diagnosis Date Noted  . Raynaud's disease without gangrene 02/10/2019  . Acute pain of left knee 03/23/2018  . Gallstones 01/07/2018  . ADHD (attention deficit hyperactivity disorder), inattentive type 11/24/2016  . Onychomycosis due to dermatophyte 08/23/2015  . Arthralgia of multiple joints 06/28/2015  . Liver fibrosis 04/02/2015  . Radiculopathy 12/12/2014  . Anemia due to other cause 10/11/2014  . Substance abuse in remission ([place]) 08/28/2014  . Chronic hepatitis C without hepatic coma ([place]) 07/06/2014  . MRSA PNEUMONIA 10/01/2008  . MUSCULOSKELETAL PAIN 09/27/2008  . ALLERGIC RHINITIS 04/12/2008  . CELLULITIS AND ABSCESS OF FACE 04/12/2008   Amador Cunas, PT, DPT [place] Jasalyn Frysinger 06/05/2019, 5:50 PM  [address] [place] [place], [place], [address] Phone: [phone]   Fax:  [phone]  Name: [name] MRN: [id number] Date of Birth: 1955/02/08

## 2019-06-07 DIAGNOSIS — Z96612 Presence of left artificial shoulder joint: Secondary | ICD-10-CM | POA: Diagnosis not present

## 2019-06-07 DIAGNOSIS — Z471 Aftercare following joint replacement surgery: Secondary | ICD-10-CM | POA: Diagnosis not present

## 2019-06-08 ENCOUNTER — Ambulatory Visit: Payer: 59 | Admitting: Physical Therapy

## 2019-06-08 ENCOUNTER — Other Ambulatory Visit: Payer: Self-pay

## 2019-06-08 ENCOUNTER — Encounter: Payer: Self-pay | Admitting: Physical Therapy

## 2019-06-08 DIAGNOSIS — M25512 Pain in left shoulder: Secondary | ICD-10-CM | POA: Diagnosis not present

## 2019-06-08 DIAGNOSIS — R293 Abnormal posture: Secondary | ICD-10-CM

## 2019-06-08 DIAGNOSIS — M25612 Stiffness of left shoulder, not elsewhere classified: Secondary | ICD-10-CM | POA: Diagnosis not present

## 2019-06-08 DIAGNOSIS — M6281 Muscle weakness (generalized): Secondary | ICD-10-CM | POA: Diagnosis not present

## 2019-06-08 NOTE — Therapy (Signed)
[place] [address] [place], [place], [address] Phone: [phone]   Fax:  [phone]  Physical Therapy Treatment  Patient Details  Name: [name] MRN: [id number] Date of Birth: 14-Jan-1956 Referring Provider (PT): Grier Mitts, [place]   Encounter Date: 06/08/2019  PT End of Session - 06/08/19 1751    Visit Number  7    Number of Visits  17    Date for PT Re-Evaluation  06/28/19    PT Start Time  8264    PT Stop Time  1700    PT Time Calculation (min)  45 min    Activity Tolerance  Patient tolerated treatment well    Behavior During Therapy  [facility] for tasks assessed/performed       Past Medical History:  Diagnosis Date  . ARDS (adult respiratory distress syndrome) ([place]) 2007  . Arthritis   . Depression   . Gallstones 01/07/2018  . Ganglion cyst of dorsum of right wrist   . Headache   . MRSA (methicillin resistant Staphylococcus aureus) 2007  . Osteoporosis   . Pneumonia 2009  . Prosthetic eye globe    right eye   . Raynaud's disease     Past Surgical History:  Procedure Laterality Date  . ANTERIOR CERVICAL DECOMP/DISCECTOMY FUSION N/A 12/12/2014   Procedure: ANTERIOR CERVICAL DECOMPRESSION/DISCECTOMY FUSION 3 LEVELS;  Surgeon: Phylliss Bob, MD;  Location: [place];  Service: Orthopedics;  Laterality: N/A;  Anterior cervical decompression fusion, cerivcal 5-6, cervical 6-7, cervical 7-thoracic 1 with instrumentation and allografrt  . APPENDECTOMY  1974  . CATARACT EXTRACTION Left 11/2018  . CHOLECYSTECTOMY N/A 01/07/2018   Procedure: LAPAROSCOPIC CHOLECYSTECTOMY WITH INTRAOPERATIVE CHOLANGIOGRAM;  Surgeon: Fanny Skates, MD;  Location: [place];  Service: General;  Laterality: N/A;  . COLONOSCOPY  04/14/2013   per Dr. Ardis Hughs, clear, repeat in 10 yrs   . CORNEAL TRANSPLANT Right 2007   x4  . ENUCLEATION Right 2013   eye infection  . GANGLION CYST EXCISION Right 05/25/2016   Procedure:  RIGHT WRIST ULNAR GANGLION EXCISION;  Surgeon: Milly Jakob, MD;  Location: [place];  Service: Orthopedics;  Laterality: Right;  . RHINOPLASTY  1980  . TONSILLECTOMY     removed in 3rd grade  . TOTAL SHOULDER ARTHROPLASTY Left 03/13/2019   Procedure: TOTAL SHOULDER ARTHROPLASTY;  Surgeon: Tania Ade, MD;  Location: [place];  Service: Orthopedics;  Laterality: Left;    There were no vitals filed for this visit.  Subjective Assessment - 06/08/19 1615    Subjective  Pt reports ortho appt went well; states that she is expecting to come to rehab for about one more month    Currently in Pain?  No/denies    Pain Score  0-No pain    Pain Location  Shoulder    Pain Orientation  Left                        [facility] Adult PT Treatment/Exercise - 06/08/19 0001      Shoulder Exercises: Sidelying   External Rotation  Left;10 reps;Weights    External Rotation Weight (lbs)  1    Flexion  Left;10 reps;Weights    Flexion Weight (lbs)  1      Shoulder Exercises: Standing   External Rotation  Left;20 reps;Strengthening;Theraband    Theraband Level (Shoulder External Rotation)  Level 1 (Yellow)    Internal Rotation  Strengthening;Both;20 reps;Weights  Theraband Level (Shoulder Internal Rotation)  Level 1 (Yellow)    ABduction  10 reps;Left;Strengthening    Row  Both;20 reps;Theraband    Theraband Level (Shoulder Row)  Level 1 (Yellow)    Other Standing Exercises  wall ladder L abduction x10, ball on wall x10      Shoulder Exercises: ROM/Strengthening   UBE (Upper Arm Bike)  L3, 3 min back and 3 min fwd      Manual Therapy   Manual Therapy  Passive ROM    Passive ROM  L shld all motions               PT Short Term Goals - 05/11/19 0947      PT SHORT TERM GOAL #1   Title  Patient to be independent with initial HEP.    Time  3    Period  Weeks    Status  Achieved    Target Date  05/24/19        PT Long Term Goals -  06/01/19 1754      PT LONG TERM GOAL #2   Title  Patient to demonstrate L shoulder AROM/PROM [facility] and without pain limiting.    Status  Partially Met            Plan - 06/08/19 1751    Clinical Impression Statement  Pt limited in functional shoulder flexion/abduction ROM. Progressed ex's to include more strengthening within available range. Pt shows improvements with PROM/stretching in ROM.    PT Treatment/Interventions  ADLs/Self Care Home Management;Cryotherapy;Electrical Stimulation;Moist Heat;Therapeutic exercise;Therapeutic activities;Functional mobility training;Ultrasound;Neuromuscular re-education;Patient/family education;Manual techniques;Vasopneumatic Device;Taping;Energy conservation;Dry needling;Passive range of motion;Scar mobilization    PT Next Visit Plan  progress per protocol according to tolerance    Consulted and Agree with Plan of Care  Patient       Patient will benefit from skilled therapeutic intervention in order to improve the following deficits and impairments:  Decreased scar mobility, Increased edema, Decreased activity tolerance, Decreased strength, Pain, Impaired UE functional use, Increased fascial restricitons, Increased muscle spasms, Improper body mechanics, Decreased range of motion, Impaired flexibility, Postural dysfunction  Visit Diagnosis: Stiffness of left shoulder, not elsewhere classified  Acute pain of left shoulder  Abnormal posture  Muscle weakness (generalized)     Problem List Patient Active Problem List   Diagnosis Date Noted  . Raynaud's disease without gangrene 02/10/2019  . Acute pain of left knee 03/23/2018  . Gallstones 01/07/2018  . ADHD (attention deficit hyperactivity disorder), inattentive type 11/24/2016  . Onychomycosis due to dermatophyte 08/23/2015  . Arthralgia of multiple joints 06/28/2015  . Liver fibrosis 04/02/2015  . Radiculopathy 12/12/2014  . Anemia due to other cause 10/11/2014  . Substance abuse in  remission ([place]) 08/28/2014  . Chronic hepatitis C without hepatic coma ([place]) 07/06/2014  . MRSA PNEUMONIA 10/01/2008  . MUSCULOSKELETAL PAIN 09/27/2008  . ALLERGIC RHINITIS 04/12/2008  . CELLULITIS AND ABSCESS OF FACE 04/12/2008   Amador Cunas, PT, DPT [name] 06/08/2019, 5:53 PM  [place] [address] [place], [place], [address] Phone: [phone]   Fax:  [phone]  Name: [name] MRN: [id number] Date of Birth: [birth year]

## 2019-06-12 ENCOUNTER — Other Ambulatory Visit: Payer: Self-pay

## 2019-06-12 ENCOUNTER — Encounter: Payer: Self-pay | Admitting: Physical Therapy

## 2019-06-12 ENCOUNTER — Ambulatory Visit: Payer: 59 | Admitting: Physical Therapy

## 2019-06-12 DIAGNOSIS — M25612 Stiffness of left shoulder, not elsewhere classified: Secondary | ICD-10-CM

## 2019-06-12 DIAGNOSIS — M25512 Pain in left shoulder: Secondary | ICD-10-CM | POA: Diagnosis not present

## 2019-06-12 DIAGNOSIS — M6281 Muscle weakness (generalized): Secondary | ICD-10-CM | POA: Diagnosis not present

## 2019-06-12 DIAGNOSIS — R293 Abnormal posture: Secondary | ICD-10-CM

## 2019-06-12 NOTE — Therapy (Signed)
[place], [place], [address] Phone: [phone]   Fax:  [phone]  Physical Therapy Treatment  Patient Details  Name: [name] MRN: [id number] Date of Birth: 09-16-1955 Referring Provider (PT): Grier Mitts, PA-C   Encounter Date: 06/12/2019  PT End of Session - 06/12/19 1708    Visit Number  8    Number of Visits  17    Date for PT Re-Evaluation  06/28/19    PT Start Time  6203    PT Stop Time  1700    PT Time Calculation (min)  45 min    Activity Tolerance  Patient tolerated treatment well    Behavior During Therapy  [facility] for tasks assessed/performed       Past Medical History:  Diagnosis Date  . ARDS (adult respiratory distress syndrome) ([place]) 2007  . Arthritis   . Depression   . Gallstones 01/07/2018  . Ganglion cyst of dorsum of right wrist   . Headache   . MRSA (methicillin resistant Staphylococcus aureus) 2007  . Osteoporosis   . Pneumonia 2009  . Prosthetic eye globe    right eye   . Raynaud's disease     Past Surgical History:  Procedure Laterality Date  . ANTERIOR CERVICAL DECOMP/DISCECTOMY FUSION N/A 12/12/2014   Procedure: ANTERIOR CERVICAL DECOMPRESSION/DISCECTOMY FUSION 3 LEVELS;  Surgeon: Phylliss Bob, MD;  Location: [place];  Service: Orthopedics;  Laterality: N/A;  Anterior cervical decompression fusion, cerivcal 5-6, cervical 6-7, cervical 7-thoracic 1 with instrumentation and allografrt  . APPENDECTOMY  1974  . CATARACT EXTRACTION Left 11/2018  . CHOLECYSTECTOMY N/A 01/07/2018   Procedure: LAPAROSCOPIC CHOLECYSTECTOMY WITH INTRAOPERATIVE CHOLANGIOGRAM;  Surgeon: Fanny Skates, MD;  Location: [place];  Service: General;  Laterality: N/A;  . COLONOSCOPY  04/14/2013   per Dr. Ardis Hughs, clear, repeat in 10 yrs   . CORNEAL TRANSPLANT Right 2007   x4  . ENUCLEATION Right 2013   eye infection  . GANGLION CYST EXCISION Right 05/25/2016   Procedure:  RIGHT WRIST ULNAR GANGLION EXCISION;  Surgeon: Milly Jakob, MD;  Location: [place];  Service: Orthopedics;  Laterality: Right;  . RHINOPLASTY  1980  . TONSILLECTOMY     removed in 3rd grade  . TOTAL SHOULDER ARTHROPLASTY Left 03/13/2019   Procedure: TOTAL SHOULDER ARTHROPLASTY;  Surgeon: Tania Ade, MD;  Location: [place];  Service: Orthopedics;  Laterality: Left;    There were no vitals filed for this visit.  Subjective Assessment - 06/12/19 1622    Subjective  Pt reports she is doing well today with no L shoulder pain    Currently in Pain?  No/denies    Pain Score  0-No pain    Pain Location  Shoulder    Pain Orientation  Left                        [facility] Adult PT Treatment/Exercise - 06/12/19 0001      Shoulder Exercises: Standing   Flexion  Strengthening;Both;10 reps    Shoulder Flexion Weight (lbs)  1    ABduction  10 reps;Strengthening;Both    Shoulder ABduction Weight (lbs)  1    Other Standing Exercises  shoulder flexion/abduction with shelf taps 2x10 1#      Shoulder Exercises: ROM/Strengthening   UBE (Upper Arm Bike)  L3, 3 min back and 3 min fwd    Ball on Wall  x10 3  sec hold at top      Manual Therapy   Manual Therapy  Passive ROM    Passive ROM  L shld all motions               PT Short Term Goals - 05/11/19 0947      PT SHORT TERM GOAL #1   Title  Patient to be independent with initial HEP.    Time  3    Period  Weeks    Status  Achieved    Target Date  05/24/19        PT Long Term Goals - 06/01/19 1754      PT LONG TERM GOAL #2   Title  Patient to demonstrate L shoulder AROM/PROM [facility] and without pain limiting.    Status  Partially Met            Plan - 06/12/19 1708    Clinical Impression Statement  Pt is still limited in shoulder flexion/abduction ROM. Incorporated more functional strengthening/ROM to prepare patient for paddle boarding. Pt shows improvements in ROM.     PT Treatment/Interventions  ADLs/Self Care Home Management;Cryotherapy;Electrical Stimulation;Moist Heat;Therapeutic exercise;Therapeutic activities;Functional mobility training;Ultrasound;Neuromuscular re-education;Patient/family education;Manual techniques;Vasopneumatic Device;Taping;Energy conservation;Dry needling;Passive range of motion;Scar mobilization    PT Next Visit Plan  progress per protocol according to tolerance    Consulted and Agree with Plan of Care  Patient       Patient will benefit from skilled therapeutic intervention in order to improve the following deficits and impairments:  Decreased scar mobility, Increased edema, Decreased activity tolerance, Decreased strength, Pain, Impaired UE functional use, Increased fascial restricitons, Increased muscle spasms, Improper body mechanics, Decreased range of motion, Impaired flexibility, Postural dysfunction  Visit Diagnosis: Stiffness of left shoulder, not elsewhere classified  Acute pain of left shoulder  Abnormal posture  Muscle weakness (generalized)     Problem List Patient Active Problem List   Diagnosis Date Noted  . Raynaud's disease without gangrene 02/10/2019  . Acute pain of left knee 03/23/2018  . Gallstones 01/07/2018  . ADHD (attention deficit hyperactivity disorder), inattentive type 11/24/2016  . Onychomycosis due to dermatophyte 08/23/2015  . Arthralgia of multiple joints 06/28/2015  . Liver fibrosis 04/02/2015  . Radiculopathy 12/12/2014  . Anemia due to other cause 10/11/2014  . Substance abuse in remission ([place]) 08/28/2014  . Chronic hepatitis C without hepatic coma ([place]) 07/06/2014  . MRSA PNEUMONIA 10/01/2008  . MUSCULOSKELETAL PAIN 09/27/2008  . ALLERGIC RHINITIS 04/12/2008  . CELLULITIS AND ABSCESS OF FACE 04/12/2008   [name], PT, DPT Donald Prose Wyllow Seigler 06/12/2019, 5:20 PM  [address] [place], [place],  [address] Phone: [phone]   Fax:  [phone]  Name: [name] MRN: [id number] Date of Birth: July 04, 1955

## 2019-06-15 ENCOUNTER — Ambulatory Visit: Payer: 59 | Admitting: Physical Therapy

## 2019-06-15 ENCOUNTER — Other Ambulatory Visit: Payer: Self-pay

## 2019-06-15 ENCOUNTER — Encounter: Payer: Self-pay | Admitting: Physical Therapy

## 2019-06-15 DIAGNOSIS — M25512 Pain in left shoulder: Secondary | ICD-10-CM | POA: Diagnosis not present

## 2019-06-15 DIAGNOSIS — M25612 Stiffness of left shoulder, not elsewhere classified: Secondary | ICD-10-CM

## 2019-06-15 DIAGNOSIS — M6281 Muscle weakness (generalized): Secondary | ICD-10-CM

## 2019-06-15 DIAGNOSIS — R293 Abnormal posture: Secondary | ICD-10-CM

## 2019-06-15 NOTE — Therapy (Signed)
[place], [place], [address] Phone: [phone]   Fax:  [phone]  Physical Therapy Treatment  Patient Details  Name: [name] MRN: [id number] Date of Birth: 27-Apr-1955 Referring Provider (PT): Grier Mitts, PA-C   Encounter Date: 06/15/2019  PT End of Session - 06/15/19 1645    Visit Number  9    Number of Visits  17    Date for PT Re-Evaluation  06/28/19    Authorization Type  [facility]    PT Start Time  1615    PT Stop Time  1700    PT Time Calculation (min)  45 min    Activity Tolerance  Patient tolerated treatment well    Behavior During Therapy  [facility] for tasks assessed/performed       Past Medical History:  Diagnosis Date  . ARDS (adult respiratory distress syndrome) ([place]) 2007  . Arthritis   . Depression   . Gallstones 01/07/2018  . Ganglion cyst of dorsum of right wrist   . Headache   . MRSA (methicillin resistant Staphylococcus aureus) 2007  . Osteoporosis   . Pneumonia 2009  . Prosthetic eye globe    right eye   . Raynaud's disease     Past Surgical History:  Procedure Laterality Date  . ANTERIOR CERVICAL DECOMP/DISCECTOMY FUSION N/A 12/12/2014   Procedure: ANTERIOR CERVICAL DECOMPRESSION/DISCECTOMY FUSION 3 LEVELS;  Surgeon: Phylliss Bob, MD;  Location: [place];  Service: Orthopedics;  Laterality: N/A;  Anterior cervical decompression fusion, cerivcal 5-6, cervical 6-7, cervical 7-thoracic 1 with instrumentation and allografrt  . APPENDECTOMY  1974  . CATARACT EXTRACTION Left 11/2018  . CHOLECYSTECTOMY N/A 01/07/2018   Procedure: LAPAROSCOPIC CHOLECYSTECTOMY WITH INTRAOPERATIVE CHOLANGIOGRAM;  Surgeon: Fanny Skates, MD;  Location: [place];  Service: General;  Laterality: N/A;  . COLONOSCOPY  04/14/2013   per Dr. Ardis Hughs, clear, repeat in 10 yrs   . CORNEAL TRANSPLANT Right 2007   x4  . ENUCLEATION Right 2013   eye infection  . GANGLION CYST EXCISION  Right 05/25/2016   Procedure: RIGHT WRIST ULNAR GANGLION EXCISION;  Surgeon: Milly Jakob, MD;  Location: [place];  Service: Orthopedics;  Laterality: Right;  . RHINOPLASTY  1980  . TONSILLECTOMY     removed in 3rd grade  . TOTAL SHOULDER ARTHROPLASTY Left 03/13/2019   Procedure: TOTAL SHOULDER ARTHROPLASTY;  Surgeon: Tania Ade, MD;  Location: [place];  Service: Orthopedics;  Laterality: Left;    There were no vitals filed for this visit.  Subjective Assessment - 06/15/19 1623    Subjective  Pt reports her shoulder is a little stiff today    Currently in Pain?  No/denies    Pain Score  0-No pain    Pain Location  Shoulder    Pain Orientation  Left                        [facility] Adult PT Treatment/Exercise - 06/15/19 0001      Shoulder Exercises: Standing   External Rotation  Left;20 reps;Strengthening;Theraband    Theraband Level (Shoulder External Rotation)  Level 1 (Yellow)    Internal Rotation  Strengthening;Both;20 reps;Weights    Theraband Level (Shoulder Internal Rotation)  Level 1 (Yellow)    Other Standing Exercises  wall slides 1# flexion/abduction 1x10    Other Standing Exercises  shoulder flexion/abduction with shelf taps 2x10 1#, paddling with dowel and abuction stretch x10  Shoulder Exercises: ROM/Strengthening   UBE (Upper Arm Bike)  L3, 3 min back and 3 min fwd      Manual Therapy   Manual Therapy  Passive ROM    Passive ROM  L shld all motions               PT Short Term Goals - 05/11/19 0947      PT SHORT TERM GOAL #1   Title  Patient to be independent with initial HEP.    Time  3    Period  Weeks    Status  Achieved    Target Date  05/24/19        PT Long Term Goals - 06/01/19 1754      PT LONG TERM GOAL #2   Title  Patient to demonstrate L shoulder AROM/PROM [facility] and without pain limiting.    Status  Partially Met            Plan - 06/15/19 1659    Clinical  Impression Statement  Pt shows some improvement in shoulder strength but still very limited in shoulder AROM particularly abduction/IR/ER. Incorporated more functional strengthening/ROM to prepare pt for paddle boarding. Continue to progress per protocol.    PT Treatment/Interventions  ADLs/Self Care Home Management;Cryotherapy;Electrical Stimulation;Moist Heat;Therapeutic exercise;Therapeutic activities;Functional mobility training;Ultrasound;Neuromuscular re-education;Patient/family education;Manual techniques;Vasopneumatic Device;Taping;Energy conservation;Dry needling;Passive range of motion;Scar mobilization    PT Next Visit Plan  progress per protocol according to tolerance    Consulted and Agree with Plan of Care  Patient       Patient will benefit from skilled therapeutic intervention in order to improve the following deficits and impairments:  Decreased scar mobility, Increased edema, Decreased activity tolerance, Decreased strength, Pain, Impaired UE functional use, Increased fascial restricitons, Increased muscle spasms, Improper body mechanics, Decreased range of motion, Impaired flexibility, Postural dysfunction  Visit Diagnosis: Stiffness of left shoulder, not elsewhere classified  Acute pain of left shoulder  Abnormal posture  Muscle weakness (generalized)     Problem List Patient Active Problem List   Diagnosis Date Noted  . Raynaud's disease without gangrene 02/10/2019  . Acute pain of left knee 03/23/2018  . Gallstones 01/07/2018  . ADHD (attention deficit hyperactivity disorder), inattentive type 11/24/2016  . Onychomycosis due to dermatophyte 08/23/2015  . Arthralgia of multiple joints 06/28/2015  . Liver fibrosis 04/02/2015  . Radiculopathy 12/12/2014  . Anemia due to other cause 10/11/2014  . Substance abuse in remission ([place]) 08/28/2014  . Chronic hepatitis C without hepatic coma ([place]) 07/06/2014  . MRSA PNEUMONIA 10/01/2008  . MUSCULOSKELETAL PAIN  09/27/2008  . ALLERGIC RHINITIS 04/12/2008  . CELLULITIS AND ABSCESS OF FACE 04/12/2008   Amador Cunas, PT, DPT Donald Prose Lahoma Constantin 06/15/2019, 5:25 PM  [address] [place], [place], [address] Phone: [phone]   Fax:  [phone]  Name: [name] MRN: [id number] Date of Birth: [birth year]

## 2019-06-18 ENCOUNTER — Encounter (HOSPITAL_BASED_OUTPATIENT_CLINIC_OR_DEPARTMENT_OTHER): Payer: Self-pay | Admitting: Emergency Medicine

## 2019-06-18 ENCOUNTER — Other Ambulatory Visit: Payer: Self-pay

## 2019-06-18 ENCOUNTER — Emergency Department (HOSPITAL_BASED_OUTPATIENT_CLINIC_OR_DEPARTMENT_OTHER)
Admission: EM | Admit: 2019-06-18 | Discharge: 2019-06-18 | Disposition: A | Discharge status: Home / Self Care | Payer: 59 | Attending: Emergency Medicine | Admitting: Emergency Medicine

## 2019-06-18 ENCOUNTER — Emergency Department (HOSPITAL_BASED_OUTPATIENT_CLINIC_OR_DEPARTMENT_OTHER): Payer: 59

## 2019-06-18 DIAGNOSIS — R109 Unspecified abdominal pain: Secondary | ICD-10-CM | POA: Diagnosis not present

## 2019-06-18 DIAGNOSIS — K529 Noninfective gastroenteritis and colitis, unspecified: Secondary | ICD-10-CM | POA: Insufficient documentation

## 2019-06-18 DIAGNOSIS — R110 Nausea: Secondary | ICD-10-CM | POA: Insufficient documentation

## 2019-06-18 DIAGNOSIS — Z79899 Other long term (current) drug therapy: Secondary | ICD-10-CM | POA: Diagnosis not present

## 2019-06-18 LAB — COMPREHENSIVE METABOLIC PANEL
ALT: 19 U/L (ref 0–44)
AST: 29 U/L (ref 15–41)
Albumin: 4.2 g/dL (ref 3.5–5.0)
Alkaline Phosphatase: 58 U/L (ref 38–126)
Anion gap: 16 — ABNORMAL HIGH (ref 5–15)
BUN: 7 mg/dL — ABNORMAL LOW (ref 8–23)
CO2: 28 mmol/L (ref 22–32)
Calcium: 8.8 mg/dL — ABNORMAL LOW (ref 8.9–10.3)
Chloride: 93 mmol/L — ABNORMAL LOW (ref 98–111)
Creatinine, Ser: 0.83 mg/dL (ref 0.44–1.00)
GFR calc Af Amer: >60 mL/min (ref >60)
GFR calc non Af Amer: >60 mL/min (ref >60)
Glucose, Bld: 115 mg/dL — ABNORMAL HIGH (ref 70–99)
Potassium: 3.7 mmol/L (ref 3.5–5.1)
Sodium: 137 mmol/L (ref 135–145)
Total Bilirubin: 0.9 mg/dL (ref 0.3–1.2)
Total Protein: 7.8 g/dL (ref 6.5–8.1)

## 2019-06-18 LAB — CBC WITH DIFFERENTIAL/PLATELET
Abs Immature Granulocytes: 0.01 10*3/uL (ref 0.00–0.07)
Basophils Absolute: 0 10*3/uL (ref 0.0–0.1)
Basophils Relative: 0 %
Eosinophils Absolute: 0 10*3/uL (ref 0.0–0.5)
Eosinophils Relative: 0 %
HCT: 44.1 % (ref 36.0–46.0)
Hemoglobin: 14.9 g/dL (ref 12.0–15.0)
Immature Granulocytes: 0 %
Lymphocytes Relative: 12 %
Lymphs Abs: 0.7 10*3/uL (ref 0.7–4.0)
MCH: 32.6 pg (ref 26.0–34.0)
MCHC: 33.8 g/dL (ref 30.0–36.0)
MCV: 96.5 fL (ref 80.0–100.0)
Monocytes Absolute: 0.5 10*3/uL (ref 0.1–1.0)
Monocytes Relative: 9 %
Neutro Abs: 4.7 10*3/uL (ref 1.7–7.7)
Neutrophils Relative %: 79 %
Platelets: 122 10*3/uL — ABNORMAL LOW (ref 150–400)
RBC: 4.57 MIL/uL (ref 3.87–5.11)
RDW: 11.9 % (ref 11.5–15.5)
WBC: 5.9 10*3/uL (ref 4.0–10.5)
nRBC: 0 % (ref 0.0–0.2)

## 2019-06-18 LAB — URINALYSIS, ROUTINE W REFLEX MICROSCOPIC
Bilirubin Urine: NEGATIVE
Glucose, UA: NEGATIVE mg/dL
Ketones, ur: >80 mg/dL — AB
Nitrite: NEGATIVE
Protein, ur: NEGATIVE mg/dL
Specific Gravity, Urine: 1.015 (ref 1.005–1.030)
pH: 6 (ref 5.0–8.0)

## 2019-06-18 LAB — URINALYSIS, MICROSCOPIC (REFLEX)

## 2019-06-18 LAB — LIPASE, BLOOD: Lipase: 23 U/L (ref 11–51)

## 2019-06-18 MED ORDER — SODIUM CHLORIDE 0.9 % IV BOLUS
1000.0000 mL | Freq: Once | INTRAVENOUS | Status: AC
  Administered 2019-06-18: 1000 mL via INTRAVENOUS

## 2019-06-18 MED ORDER — ONDANSETRON HCL 4 MG/2ML IJ SOLN
4.0000 mg | Freq: Once | INTRAMUSCULAR | Status: AC
  Administered 2019-06-18: 4 mg via INTRAVENOUS
  Filled 2019-06-18: qty 2

## 2019-06-18 MED ORDER — CIPROFLOXACIN HCL 500 MG PO TABS: 500.0000 mg | ORAL_TABLET | Freq: Two times a day (BID) | ORAL | 0 refills | Status: AC

## 2019-06-18 MED ORDER — METRONIDAZOLE 500 MG PO TABS
500.0000 mg | ORAL_TABLET | Freq: Once | ORAL | Status: AC
  Administered 2019-06-18: 500 mg via ORAL
  Filled 2019-06-18: qty 1

## 2019-06-18 MED ORDER — IOHEXOL 300 MG/ML  SOLN
100.0000 mL | Freq: Once | INTRAMUSCULAR | Status: AC | PRN
  Administered 2019-06-18: 85 mL via INTRAVENOUS

## 2019-06-18 MED ORDER — ONDANSETRON 4 MG PO TBDP: 4.0000 mg | ORAL_TABLET | Freq: Three times a day (TID) | ORAL | 0 refills | Status: DC | PRN

## 2019-06-18 MED ORDER — FENTANYL CITRATE (PF) 100 MCG/2ML IJ SOLN
50.0000 ug | Freq: Once | INTRAMUSCULAR | Status: AC
  Administered 2019-06-18: 50 ug via INTRAVENOUS
  Filled 2019-06-18: qty 2

## 2019-06-18 MED ORDER — ACETAMINOPHEN 325 MG PO TABS
650.0000 mg | ORAL_TABLET | Freq: Once | ORAL | Status: AC
  Administered 2019-06-18: 650 mg via ORAL
  Filled 2019-06-18: qty 2

## 2019-06-18 MED ORDER — METRONIDAZOLE 500 MG PO TABS: 500.0000 mg | ORAL_TABLET | Freq: Two times a day (BID) | ORAL | 0 refills | Status: AC

## 2019-06-18 MED ORDER — CIPROFLOXACIN HCL 500 MG PO TABS
500.0000 mg | ORAL_TABLET | Freq: Once | ORAL | Status: AC
  Administered 2019-06-18: 500 mg via ORAL
  Filled 2019-06-18: qty 1

## 2019-06-18 MED ORDER — OXYCODONE-ACETAMINOPHEN 5-325 MG PO TABS
1.0000 | ORAL_TABLET | Freq: Once | ORAL | Status: AC
  Administered 2019-06-18: 1 via ORAL
  Filled 2019-06-18: qty 1

## 2019-06-18 NOTE — Discharge Instructions (Addendum)
Please schedule follow-up appoint with your primary doctor for recheck this coming week, ideally in the next 2 to 3 days.  Please take antibiotics ciprofloxacin and Flagyl as prescribed.  Your next dose will be this evening.  Take Tylenol, Motrin as needed for pain control.  For nausea and pain control can also take Zofran as prescribed as needed.  If you develop worsening pain, vomiting, any fever, or other concerning symptom, return to ER for reassessment.

## 2019-06-18 NOTE — ED Provider Notes (Signed)
[facility] EMERGENCY DEPARTMENT Provider Note   CSN: [id number] Arrival date & time: 06/18/19  0703     History Chief Complaint  Patient presents with   Abdominal Pain   Diarrhea   Nausea    [name] is a 64 y.o. female.  Presented to emergency room with concern for abdominal pain.  Symptoms ongoing for the past 3 days, initially started having symptoms of generalized fatigue, nausea and some loose stools, has progressed to have severe lower abdominal pain, worsening throughout the day yesterday, relatively constant, currently 5 out of 10 in severity, 5 or 6 bowel movements yesterday, all loose.  No blood.  Not currently nauseous, has not had any vomiting.  Reports past surgical history includes appendectomy, cholecystectomy.  HPI     Past Medical History:  Diagnosis Date   ARDS (adult respiratory distress syndrome) ([place]) 2007   Arthritis    Depression    Gallstones 01/07/2018   Ganglion cyst of dorsum of right wrist    Headache    MRSA (methicillin resistant Staphylococcus aureus) 2007   Osteoporosis    Pneumonia 2009   Prosthetic eye globe    right eye    Raynaud's disease     Patient Active Problem List   Diagnosis Date Noted   Raynaud's disease without gangrene 02/10/2019   Acute pain of left knee 03/23/2018   Gallstones 01/07/2018   ADHD (attention deficit hyperactivity disorder), inattentive type 11/24/2016   Onychomycosis due to dermatophyte 08/23/2015   Arthralgia of multiple joints 06/28/2015   Liver fibrosis 04/02/2015   Radiculopathy 12/12/2014   Anemia due to other cause 10/11/2014   Substance abuse in remission ([place]) 08/28/2014   Chronic hepatitis C without hepatic coma ([place]) 07/06/2014   MRSA PNEUMONIA 10/01/2008   MUSCULOSKELETAL PAIN 09/27/2008   ALLERGIC RHINITIS 04/12/2008   CELLULITIS AND ABSCESS OF FACE 04/12/2008    Past Surgical History:  Procedure Laterality Date   ANTERIOR  CERVICAL DECOMP/DISCECTOMY FUSION N/A 12/12/2014   Procedure: ANTERIOR CERVICAL DECOMPRESSION/DISCECTOMY FUSION 3 LEVELS;  Surgeon: Phylliss Bob, MD;  Location: [facility];  Service: Orthopedics;  Laterality: N/A;  Anterior cervical decompression fusion, cerivcal 5-6, cervical 6-7, cervical 7-thoracic 1 with instrumentation and allografrt   APPENDECTOMY  1974   CATARACT EXTRACTION Left 11/2018   CHOLECYSTECTOMY N/A 01/07/2018   Procedure: LAPAROSCOPIC CHOLECYSTECTOMY WITH INTRAOPERATIVE CHOLANGIOGRAM;  Surgeon: Fanny Skates, MD;  Location: [facility];  Service: General;  Laterality: N/A;   COLONOSCOPY  04/14/2013   per Dr. Ardis Hughs, clear, repeat in 10 yrs    CORNEAL TRANSPLANT Right 2007   x4   ENUCLEATION Right 2013   eye infection   GANGLION CYST EXCISION Right 05/25/2016   Procedure: RIGHT WRIST ULNAR GANGLION EXCISION;  Surgeon: Milly Jakob, MD;  Location: [facility];  Service: Orthopedics;  Laterality: Right;   [place]     removed in 3rd grade   TOTAL SHOULDER ARTHROPLASTY Left 03/13/2019   Procedure: TOTAL SHOULDER ARTHROPLASTY;  Surgeon: Tania Ade, MD;  Location: [facility];  Service: Orthopedics;  Laterality: Left;     OB History   No obstetric history on file.     Family History  Problem Relation Age of Onset   Breast cancer Maternal Aunt    Colon cancer Neg Hx     Social History   Tobacco Use   Smoking status: Never Smoker   Smokeless tobacco: Never Used  Substance Use Topics   Alcohol use:  No    Alcohol/week: 0.0 standard drinks   Drug use: No    Home Medications Prior to Admission medications   Medication Sig Start Date End Date Taking? Authorizing Provider  acetaminophen (TYLENOL) 500 MG tablet Take 500 mg by mouth every 6 (six) hours as needed.    [provider]  amphetamine-dextroamphetamine (ADDERALL XR) 20 MG 24 hr capsule Take 1 capsule (20 mg total) by mouth daily.  05/31/19 06/30/19  Laurey Morale, MD  B Complex-C (SUPER B COMPLEX PO) Take 1 tablet by mouth daily.    [provider]  Calcium Carbonate-Vitamin D (CALCIUM 600+D) 600-400 MG-UNIT tablet Take 1 tablet by mouth daily.     [provider]  Carboxymethylcellul-Glycerin (REFRESH OPTIVE PF OP) Apply 1 drop to eye daily as needed (dry eyes).    [provider]  ciprofloxacin (CIPRO) 500 MG tablet Take 1 tablet (500 mg total) by mouth every 12 (twelve) hours for 7 days. 06/18/19 06/25/19  Lucrezia Starch, MD  Diclofenac Sodium 2 % SOLN Apply 1 application topically 4 (four) times daily. 02/10/19   Laurey Morale, MD  gabapentin (NEURONTIN) 300 MG capsule TAKE 1 CAPSULE (300 MG TOTAL) BY MOUTH 3 TIMES DAILY. 12/02/18   Laurey Morale, MD  metroNIDAZOLE (FLAGYL) 500 MG tablet Take 1 tablet (500 mg total) by mouth 2 (two) times daily for 7 days. 06/18/19 06/25/19  Lucrezia Starch, MD  mometasone (NASONEX) 50 MCG/ACT nasal spray INSTILL 2 SPRAYS INTO THE NOSE DAILY AS NEEDED FOR ALLERGIES Patient not taking: Reported on 05/03/2019 02/24/17   Laurey Morale, MD  ondansetron (ZOFRAN ODT) 4 MG disintegrating tablet Take 1 tablet (4 mg total) by mouth every 8 (eight) hours as needed for nausea or vomiting. 06/18/19   Lucrezia Starch, MD  Oxycodone HCl 10 MG TABS Take 1 tablet (10 mg total) by mouth every 6 (six) hours as needed (pain). 06/25/19 07/25/19  Laurey Morale, MD  polyethylene glycol powder (GLYCOLAX/MIRALAX) powder TAKE 17 GRAMS BY MOUTH 2 TIMES DAILY AS NEEDED. Patient taking differently: Take 0.5 Containers by mouth daily as needed for moderate constipation.  02/02/17   Laurey Morale, MD  tiZANidine (ZANAFLEX) 4 MG tablet Take 1 tablet (4 mg total) by mouth every 8 (eight) hours as needed for muscle spasms. 03/13/19   Grier Mitts, PA-C  tobramycin (TOBREX) 0.3 % ophthalmic solution Place 2 drops into the right eye every 4 (four) hours. 04/26/19   Laurey Morale, MD    valACYclovir (VALTREX) 1000 MG tablet Take 1,000 mg by mouth daily.     [provider]  verapamil (CALAN-SR) 120 MG CR tablet Take 1 tablet (120 mg total) by mouth at bedtime. 02/10/19   Laurey Morale, MD    Allergies    Patient has no known allergies.  Review of Systems   Review of Systems  Constitutional: Negative for chills and fever.  HENT: Negative for ear pain and sore throat.   Eyes: Negative for pain and visual disturbance.  Respiratory: Negative for cough and shortness of breath.   Cardiovascular: Negative for chest pain and palpitations.  Gastrointestinal: Positive for abdominal pain, diarrhea and nausea. Negative for vomiting.  Genitourinary: Negative for dysuria and hematuria.  Musculoskeletal: Negative for arthralgias and back pain.  Skin: Negative for color change and rash.  Neurological: Negative for seizures and syncope.  All other systems reviewed and are negative.   Physical Exam Updated Vital Signs BP 127/71 (BP Location:  Right Arm)    Pulse 72    Temp 98.1 F (36.7 C) (Oral)    Resp 20    SpO2 100%   Physical Exam Vitals and nursing note reviewed.  Constitutional:      General: She is not in acute distress.    Appearance: She is well-developed.  HENT:     Head: Normocephalic and atraumatic.  Eyes:     Conjunctiva/sclera: Conjunctivae normal.  Cardiovascular:     Rate and Rhythm: Normal rate and regular rhythm.     Heart sounds: No murmur.  Pulmonary:     Effort: Pulmonary effort is normal. No respiratory distress.     Breath sounds: Normal breath sounds.  Abdominal:     Palpations: Abdomen is soft.     Tenderness: There is no abdominal tenderness.     Comments: Generalized tenderness to palpation, worse across left lower and right lower quadrants  Musculoskeletal:     Cervical back: Neck supple.  Skin:    General: Skin is warm and dry.  Neurological:     Mental Status: She is alert.     ED Results / Procedures / Treatments    Labs (all labs ordered are listed, but only abnormal results are displayed) Labs Reviewed  CBC WITH DIFFERENTIAL/PLATELET - Abnormal; Notable for the following components:      Result Value   Platelets 122 (*)    All other components within normal limits  COMPREHENSIVE METABOLIC PANEL - Abnormal; Notable for the following components:   Chloride 93 (*)    Glucose, Bld 115 (*)    BUN 7 (*)    Calcium 8.8 (*)    Anion gap 16 (*)    All other components within normal limits  URINALYSIS, ROUTINE W REFLEX MICROSCOPIC - Abnormal; Notable for the following components:   Hgb urine dipstick SMALL (*)    Ketones, ur >80 (*)    Leukocytes,Ua TRACE (*)    All other components within normal limits  URINALYSIS, MICROSCOPIC (REFLEX) - Abnormal; Notable for the following components:   Bacteria, UA FEW (*)    All other components within normal limits  LIPASE, BLOOD    EKG None  Radiology CT ABDOMEN PELVIS W CONTRAST  Result Date: 06/18/2019 CLINICAL DATA:  Lower abdominal pain EXAM: CT ABDOMEN AND PELVIS WITH CONTRAST TECHNIQUE: Multidetector CT imaging of the abdomen and pelvis was performed using the standard protocol following bolus administration of intravenous contrast. CONTRAST:  16mL OMNIPAQUE IOHEXOL 300 MG/ML  SOLN COMPARISON:  None. FINDINGS: Lower chest: Lung bases demonstrate some mild subpleural scarring. Hepatobiliary: Fatty infiltration of the liver is noted. The gallbladder has been surgically removed. Pancreas: Unremarkable. No pancreatic ductal dilatation or surrounding inflammatory changes. Spleen: Normal in size without focal abnormality. Adrenals/Urinary Tract: Adrenal glands are within normal limits. Kidneys show normal enhancement pattern bilaterally. No renal calculi or obstructive changes are seen. Delayed images demonstrate normal excretion of contrast. The bladder is partially distended. Stomach/Bowel: Minimal diverticular change of the colon is noted. There is some wall  thickening in the colon within the ascending colon and proximal transverse colon consistent with a degree of mild colitis. Very mild Peri colonic inflammatory changes are seen. No perforation or abscess is noted. The appendix has been surgically removed. Small bowel and stomach are unremarkable. Vascular/Lymphatic: Aortic atherosclerosis. No enlarged abdominal or pelvic lymph nodes. Reproductive: Uterus and bilateral adnexa are unremarkable. Other: No abdominal wall hernia or abnormality. No abdominopelvic ascites. Musculoskeletal: Degenerative changes of lumbar spine are  seen. Bilateral L5 pars defects are noted with minimal anterolisthesis of L5 on S1. IMPRESSION: Mild colitis involving the ascending colon and proximal transverse colon. No definitive vascular abnormality is seen although this may be related to an infectious or ischemic etiology. Diverticular change of the colon without diverticulitis. Fatty liver Electronically Signed   By: Inez Catalina M.D.   On: 06/18/2019 09:02    Procedures Procedures (including critical care time)  Medications Ordered in ED Medications  ondansetron (ZOFRAN) injection 4 mg (4 mg Intravenous Given 06/18/19 0742)  sodium chloride 0.9 % bolus 1,000 mL (0 mLs Intravenous Stopped 06/18/19 0912)  acetaminophen (TYLENOL) tablet 650 mg (650 mg Oral Given 06/18/19 0742)  iohexol (OMNIPAQUE) 300 MG/ML solution 100 mL (85 mLs Intravenous Contrast Given 06/18/19 0833)  fentaNYL (SUBLIMAZE) injection 50 mcg (50 mcg Intravenous Given 06/18/19 0927)  ciprofloxacin (CIPRO) tablet 500 mg (500 mg Oral Given 06/18/19 1109)  metroNIDAZOLE (FLAGYL) tablet 500 mg (500 mg Oral Given 06/18/19 1109)  oxyCODONE-acetaminophen (PERCOCET/ROXICET) 5-325 MG per tablet 1 tablet (1 tablet Oral Given 06/18/19 1109)    ED Course  I have reviewed the triage vital signs and the nursing notes.  Pertinent labs & imaging results that were available during my care of the patient were reviewed by me and  considered in my medical decision making (see chart for details).  Clinical Course as of Jun 18 1618  Sun Jun 18, 2019  0730 Complete initial assessment, will get labs and CT   [RD]  0818 Ketones, ur(!): >80 [RD]  0819 Likely dehydrated, will await CT imaging  Anion gap(!): 16 [RD]    Clinical Course User Index [RD] Lucrezia Starch, MD   MDM Rules/Calculators/A&P                      64 year old lady who presented to ER with concern for abdominal pain.  On exam patient noted to have some tenderness but was overall well-appearing with normal vitals.  Labs grossly normal limits but did have some findings concerning for dehydration.  Provided patient with nausea, pain and fluid medication.  CT scan concerning for colitis.  Consistent symptomatology, suspect this is most likely infectious.  Suspect most likely viral but given symptoms, will treat for possible bacterial etiology with Cipro, Flagyl.  Patient symptoms well controlled, tolerating p.o., recommend recheck with primary doctor, return to ER for worsening symptoms.    After the discussed management above, the patient was determined to be safe for discharge.  The patient was in agreement with this plan and all questions regarding their care were answered.  ED return precautions were discussed and the patient will return to the ED with any significant worsening of condition.   Final Clinical Impression(s) / ED Diagnoses Final diagnoses:  Colitis    Rx / DC Orders ED Discharge Orders         Ordered    ciprofloxacin (CIPRO) 500 MG tablet  Every 12 hours     06/18/19 1129    metroNIDAZOLE (FLAGYL) 500 MG tablet  2 times daily     06/18/19 1129    ondansetron (ZOFRAN ODT) 4 MG disintegrating tablet  Every 8 hours PRN     06/18/19 1129           Lucrezia Starch, MD 06/18/19 1622

## 2019-06-18 NOTE — ED Triage Notes (Signed)
Pt here with 3 days lower bilateral abdominal pain with diarrhea and nausea.

## 2019-06-20 ENCOUNTER — Telehealth (INDEPENDENT_AMBULATORY_CARE_PROVIDER_SITE_OTHER): Payer: 59 | Admitting: Family Medicine

## 2019-06-20 DIAGNOSIS — K529 Noninfective gastroenteritis and colitis, unspecified: Secondary | ICD-10-CM

## 2019-06-20 DIAGNOSIS — R197 Diarrhea, unspecified: Secondary | ICD-10-CM | POA: Diagnosis not present

## 2019-06-20 DIAGNOSIS — R110 Nausea: Secondary | ICD-10-CM | POA: Diagnosis not present

## 2019-06-20 DIAGNOSIS — R109 Unspecified abdominal pain: Secondary | ICD-10-CM

## 2019-06-20 DIAGNOSIS — R195 Other fecal abnormalities: Secondary | ICD-10-CM | POA: Diagnosis not present

## 2019-06-20 NOTE — Progress Notes (Signed)
Virtual Visit via Video Note  I connected with [name]  on 06/20/19 at  3:40 PM EDT by a video enabled telemedicine application and verified that I am speaking with the correct person using two identifiers.  Location patient: home, [place] Location provider:work or home office Persons participating in the virtual visit: patient, provider  I discussed the limitations of evaluation and management by telemedicine and the availability of in person appointments. The patient expressed understanding and agreed to proceed.   HPI:  Acute visit for diarrhea and abd pain: -this started about 5 days ago -she went to the ER on 06/18/19 and was diagnosed with colitis by CT scan amd was started on cipro -symptoms include severe diarrhea, fevers up to a high of 101.9  severe abd pain, extreme fatigue, weight loss -feels like is getting weaker and weaker, pain level in abd is 8/10 today at times, intermittent, she thinks she has lost 6 lbs, she is trying to drink fluids and eat small amounts - but gets sick every time she eats -she continues to have watery, very dark almost tarry in color, about 5-8 times per  -fully vaccinated for [place], [employer] -she has not been around anyone in the last 2 weeks outside of household, no sick contacts, works from home  ROS: See pertinent positives and negatives per HPI.  Past Medical History:  Diagnosis Date  . ARDS (adult respiratory distress syndrome) ([place]) 2007  . Arthritis   . Depression   . Gallstones 01/07/2018  . Ganglion cyst of dorsum of right wrist   . Headache   . MRSA (methicillin resistant Staphylococcus aureus) 2007  . Osteoporosis   . Pneumonia 2009  . Prosthetic eye globe    right eye   . Raynaud's disease     Past Surgical History:  Procedure Laterality Date  . ANTERIOR CERVICAL DECOMP/DISCECTOMY FUSION N/A 12/12/2014   Procedure: ANTERIOR CERVICAL DECOMPRESSION/DISCECTOMY FUSION 3 LEVELS;  Surgeon: Phylliss Bob, MD;  Location: [facility];  Service:  Orthopedics;  Laterality: N/A;  Anterior cervical decompression fusion, cerivcal 5-6, cervical 6-7, cervical 7-thoracic 1 with instrumentation and allografrt  . APPENDECTOMY  1974  . CATARACT EXTRACTION Left 11/2018  . CHOLECYSTECTOMY N/A 01/07/2018   Procedure: LAPAROSCOPIC CHOLECYSTECTOMY WITH INTRAOPERATIVE CHOLANGIOGRAM;  Surgeon: Fanny Skates, MD;  Location: [facility];  Service: General;  Laterality: N/A;  . COLONOSCOPY  04/14/2013   per Dr. Ardis Hughs, clear, repeat in 10 yrs   . CORNEAL TRANSPLANT Right 2007   x4  . ENUCLEATION Right 2013   eye infection  . GANGLION CYST EXCISION Right 05/25/2016   Procedure: RIGHT WRIST ULNAR GANGLION EXCISION;  Surgeon: Milly Jakob, MD;  Location: [facility];  Service: Orthopedics;  Laterality: Right;  . RHINOPLASTY  1980  . TONSILLECTOMY     removed in 3rd grade  . TOTAL SHOULDER ARTHROPLASTY Left 03/13/2019   Procedure: TOTAL SHOULDER ARTHROPLASTY;  Surgeon: Tania Ade, MD;  Location: [facility];  Service: Orthopedics;  Laterality: Left;    Family History  Problem Relation Age of Onset  . Breast cancer Maternal Aunt   . Colon cancer Neg Hx     SOCIAL HX: see hpi   Current Outpatient Medications:  .  acetaminophen (TYLENOL) 500 MG tablet, Take 500 mg by mouth every 6 (six) hours as needed., Disp: , Rfl:  .  amphetamine-dextroamphetamine (ADDERALL XR) 20 MG 24 hr capsule, Take 1 capsule (20 mg total) by mouth daily., Disp: 30 capsule, Rfl: 0 .  B  Complex-C (SUPER B COMPLEX PO), Take 1 tablet by mouth daily., Disp: , Rfl:  .  Calcium Carbonate-Vitamin D (CALCIUM 600+D) 600-400 MG-UNIT tablet, Take 1 tablet by mouth daily. , Disp: , Rfl:  .  Carboxymethylcellul-Glycerin (REFRESH OPTIVE PF OP), Apply 1 drop to eye daily as needed (dry eyes)., Disp: , Rfl:  .  ciprofloxacin (CIPRO) 500 MG tablet, Take 1 tablet (500 mg total) by mouth every 12 (twelve) hours for 7 days., Disp: 14 tablet, Rfl: 0 .  Diclofenac  Sodium 2 % SOLN, Apply 1 application topically 4 (four) times daily., Disp: 112 g, Rfl: 11 .  gabapentin (NEURONTIN) 300 MG capsule, TAKE 1 CAPSULE (300 MG TOTAL) BY MOUTH 3 TIMES DAILY., Disp: 270 capsule, Rfl: 1 .  metroNIDAZOLE (FLAGYL) 500 MG tablet, Take 1 tablet (500 mg total) by mouth 2 (two) times daily for 7 days., Disp: 14 tablet, Rfl: 0 .  mometasone (NASONEX) 50 MCG/ACT nasal spray, INSTILL 2 SPRAYS INTO THE NOSE DAILY AS NEEDED FOR ALLERGIES (Patient not taking: Reported on 05/03/2019), Disp: 17 g, Rfl: 11 .  ondansetron (ZOFRAN ODT) 4 MG disintegrating tablet, Take 1 tablet (4 mg total) by mouth every 8 (eight) hours as needed for nausea or vomiting., Disp: 20 tablet, Rfl: 0 .  [START ON 06/25/2019] Oxycodone HCl 10 MG TABS, Take 1 tablet (10 mg total) by mouth every 6 (six) hours as needed (pain)., Disp: 120 tablet, Rfl: 0 .  polyethylene glycol powder (GLYCOLAX/MIRALAX) powder, TAKE 17 GRAMS BY MOUTH 2 TIMES DAILY AS NEEDED. (Patient taking differently: Take 0.5 Containers by mouth daily as needed for moderate constipation. ), Disp: 3162 g, Rfl: 1 .  tiZANidine (ZANAFLEX) 4 MG tablet, Take 1 tablet (4 mg total) by mouth every 8 (eight) hours as needed for muscle spasms., Disp: 30 tablet, Rfl: 1 .  tobramycin (TOBREX) 0.3 % ophthalmic solution, Place 2 drops into the right eye every 4 (four) hours., Disp: 5 mL, Rfl: 1 .  valACYclovir (VALTREX) 1000 MG tablet, Take 1,000 mg by mouth daily. , Disp: , Rfl:  .  verapamil (CALAN-SR) 120 MG CR tablet, Take 1 tablet (120 mg total) by mouth at bedtime., Disp: 30 tablet, Rfl: 5  EXAM:  VITALS per patient if applicable:  GENERAL: alert, oriented, appears to not feel well, thin  HEENT: atraumatic, conjunttiva clear, no obvious abnormalities on inspection of external nose and ears  NECK: normal movements of the head and neck  LUNGS: on inspection no signs of respiratory distress, breathing rate appears normal, no obvious gross SOB, gasping or  wheezing  CV: no obvious cyanosis  MS: moves all visible extremities without noticeable abnormality  ABD: admits to abd TTP   PSYCH/NEURO: pleasant and cooperative, no obvious depression or anxiety, speech and thought processing grossly intact  ASSESSMENT AND PLAN:  Discussed the following assessment and plan:  Abdominal pain, unspecified abdominal location  Diarrhea, unspecified type  Dark stools  Nausea  Colitis  -we discussed possible serious and likely etiologies, options for evaluation and workup, limitations of telemedicine visit vs in person visit, treatment, treatment risks and precautions. I do not like that she does not seem to be improving and is feeling worse. Query ischemic vs infectious colitis.  Fear she may not be holding in enough nutrients/fluids or is worsening given she reports feeling worse, also ? [place]. Advise ER for reevaluation/possible admission. She is reluctant to go back to the ER.  Agreed to reach out to GI to see if they could  see her promptly or if they advise ER re-eval. Advised her to go to ER if worsening, GI advises her to do so or if can not get appt with GI in next 24 hours and continues to feel poorly. She agreed. My assistant contacted GI and told me they would reach out to talk with the patient. I tried to call patient back 3 times to see what the outcome was and lm for her to call us back as she did not answer. Will also send message to assistant to try again to contact her in the morning.   I discussed the assessment and treatment plan with the patient. The patient was provided an opportunity to ask questions and all were answered. The patient agreed with the plan and demonstrated an understanding of the instructions.   The patient was advised to call back or seek an in-person evaluation if the symptoms worsen or if the condition fails to improve as anticipated.   Lucretia Kern, DO

## 2019-06-21 ENCOUNTER — Encounter: Payer: Self-pay | Admitting: Family Medicine

## 2019-06-21 ENCOUNTER — Encounter: Payer: 59 | Admitting: Physical Therapy

## 2019-06-28 ENCOUNTER — Encounter: Payer: 59 | Admitting: Physical Therapy

## 2019-06-29 ENCOUNTER — Other Ambulatory Visit: Payer: Self-pay | Admitting: Family Medicine

## 2019-06-29 ENCOUNTER — Ambulatory Visit (INDEPENDENT_AMBULATORY_CARE_PROVIDER_SITE_OTHER)
Admission: RE | Admit: 2019-06-29 | Discharge: 2019-06-29 | Disposition: A | Discharge status: Home / Self Care | Payer: 59 | Source: Ambulatory Visit

## 2019-06-29 DIAGNOSIS — B373 Candidiasis of vulva and vagina: Secondary | ICD-10-CM

## 2019-06-29 DIAGNOSIS — B3731 Acute candidiasis of vulva and vagina: Secondary | ICD-10-CM

## 2019-06-29 MED ORDER — AMPHETAMINE-DEXTROAMPHET ER 20 MG PO CP24: 20.0000 mg | ORAL_CAPSULE | Freq: Every day | ORAL | 0 refills | Status: DC

## 2019-06-29 MED ORDER — FLUCONAZOLE 150 MG PO TABS: 150.0000 mg | ORAL_TABLET | Freq: Every day | ORAL | 0 refills | Status: DC

## 2019-06-29 MED ORDER — NYSTATIN 100000 UNIT/GM EX CREA
TOPICAL_CREAM | CUTANEOUS | 0 refills | Status: DC
Start: 2019-06-29 — End: 2020-06-07

## 2019-06-29 MED FILL — NYSTATIN 100,000 UNIT/GM CR: 100000 | 30 days supply | Qty: 30 | Fill #0

## 2019-06-29 MED FILL — FLUCONAZOLE 150 MG TABS: 150 | 3 days supply | Qty: 2 | Fill #0

## 2019-06-29 NOTE — Addendum Note (Signed)
Addended by: [name] A on: 06/29/2019 08:13 PM   Modules accepted: Orders

## 2019-06-29 NOTE — Telephone Encounter (Signed)
Please refill if appropriate

## 2019-06-29 NOTE — ED Provider Notes (Signed)
Virtual Visit via Video Note:  [name]  initiated request for Telemedicine visit with [facility] team. I connected with [name]  on 06/29/2019 at 3:52 PM  for a synchronized telemedicine visit using a video enabled HIPPA compliant telemedicine application. I verified that I am speaking with [name]  using two identifiers. Sharion Balloon, NP  was physically located in a [facility] Urgent care site and [name] was located at a different location.   The limitations of evaluation and management by telemedicine as well as the availability of in-person appointments were discussed. Patient was informed that she  may incur a bill ( including co-pay) for this virtual visit encounter. [name]  expressed understanding and gave verbal consent to proceed with virtual visit.     History of Present Illness:[name]  is a 64 y.o. female presents for evaluation of white vaginal discharge, itching, and burning x 4 days.  She was seen in the ED on 06/18/2019; diagnosed with colitis and treated with antibiotics. She believes she got a "yeast infection" from the antibiotics.  Her symptoms are similar to previous yeast infections.  Treatment attempted at home with Vaseline.  She denies fever, abdominal pain, dysuria, back pain, pelvic pain, or other symptoms.     No Known Allergies   Past Medical History:  Diagnosis Date  . ARDS (adult respiratory distress syndrome) ([place]) 2007  . Arthritis   . Depression   . Gallstones 01/07/2018  . Ganglion cyst of dorsum of right wrist   . Headache   . MRSA (methicillin resistant Staphylococcus aureus) 2007  . Osteoporosis   . Pneumonia 2009  . Prosthetic eye globe    right eye   . Raynaud's disease      Social History   Tobacco Use  . Smoking status: Never Smoker  . Smokeless tobacco: Never Used  Substance Use Topics  . Alcohol use: No    Alcohol/week: 0.0 standard drinks   . Drug use: No   ROS: as stated in HPI.  All other systems reviewed and negative.      Observations/Objective: Physical Exam  VITALS: Patient denies fever. GENERAL: Alert, appears well and in no acute distress. HEENT: Atraumatic. NECK: Normal movements of the head and neck. CARDIOPULMONARY: No increased WOB. Speaking in clear sentences. I:E ratio WNL.  MS: Moves all visible extremities without noticeable abnormality. PSYCH: Pleasant and cooperative, well-groomed. Speech normal rate and rhythm. Affect is appropriate. Insight and judgement are appropriate. Attention is focused, linear, and appropriate.  NEURO: CN grossly intact. Oriented as arrived to appointment on time with no prompting. Moves both UE equally.  SKIN: No obvious lesions, wounds, erythema, or cyanosis noted on face or hands.   Assessment and Plan:    ICD-10-CM   1. Candidal vaginitis  B37.3        Follow Up Instructions: Patient symptoms are consistent with vaginal candidiasis.  Treating with Diflucan and Nystatin cream.  Instructed patient to follow up with her PCP or come here to be seen in person if her symptoms are not improving.  She agrees to plan of care.      I discussed the assessment and treatment plan with the patient. The patient was provided an opportunity to ask questions and all were answered. The patient agreed with the plan and demonstrated an understanding of the instructions.   The patient was advised to call back or seek an in-person evaluation if the  symptoms worsen or if the condition fails to improve as anticipated.      Sharion Balloon, NP  06/29/2019 3:52 PM         Sharion Balloon, NP 06/29/19 [id number]

## 2019-06-29 NOTE — Telephone Encounter (Signed)
Done

## 2019-06-29 NOTE — Discharge Instructions (Signed)
Take the Diflucan and use the Nystatin cream as directed.    Follow up with your primary care provider or come here to be seen in person if your symptoms are not improving.

## 2019-06-30 MED FILL — ADDERALL XR 20 MG CAP SA: 20 | 30 days supply | Qty: 30 | Fill #0

## 2019-07-03 NOTE — Telephone Encounter (Signed)
Please deny this medication. Prescriptions are on file at the pharmacy. mychart message sent to the patient.

## 2019-07-05 ENCOUNTER — Encounter: Payer: 59 | Admitting: Physical Therapy

## 2019-07-12 ENCOUNTER — Encounter: Payer: 59 | Admitting: Physical Therapy

## 2019-07-13 ENCOUNTER — Ambulatory Visit: Payer: 59 | Admitting: Gastroenterology

## 2019-07-14 MED FILL — VERAPAMIL ER 120 MG TABLET: 120 | 30 days supply | Qty: 30 | Fill #5

## 2019-07-26 ENCOUNTER — Other Ambulatory Visit: Payer: Self-pay

## 2019-07-26 ENCOUNTER — Encounter: Payer: Self-pay | Admitting: Family Medicine

## 2019-07-26 ENCOUNTER — Ambulatory Visit (INDEPENDENT_AMBULATORY_CARE_PROVIDER_SITE_OTHER): Payer: 59 | Admitting: Family Medicine

## 2019-07-26 VITALS — BP 120/70 | HR 92 | Temp 98.0°F | Wt 105.4 lb

## 2019-07-26 DIAGNOSIS — F1190 Opioid use, unspecified, uncomplicated: Secondary | ICD-10-CM

## 2019-07-26 DIAGNOSIS — M2550 Pain in unspecified joint: Secondary | ICD-10-CM

## 2019-07-26 MED ORDER — OXYCODONE HCL 10 MG PO TABS: 10.0000 mg | ORAL_TABLET | Freq: Four times a day (QID) | ORAL | 0 refills | Status: AC | PRN

## 2019-07-26 MED ORDER — OXYCODONE HCL 10 MG PO TABS: 10.0000 mg | ORAL_TABLET | Freq: Four times a day (QID) | ORAL | 0 refills | Status: DC | PRN

## 2019-07-26 MED ORDER — GABAPENTIN 300 MG PO CAPS: ORAL_CAPSULE | ORAL | 1 refills | Status: DC

## 2019-07-26 MED FILL — GABAPENTIN 300 MG CAPSULE: 300 | 90 days supply | Qty: 270 | Fill #0

## 2019-07-26 NOTE — Progress Notes (Signed)
   Subjective:    Patient ID: [name], female    DOB: 1955/12/02, 64 y.o.   MRN: [id number]  HPI Here for pain management, she is doing well.  Indication for chronic opioid: osteoarthritis Medication and dose: Oxycodone 10 mg  # pills per month: 120 Last UDS date: 02-10-19 Opioid Treatment Agreement signed (Y/N): 04-30-17 Opioid Treatment Agreement last reviewed with patient:  07-26-19 [facility] reviewed this encounter (include red flags): Yes     Review of Systems     Objective:   Physical Exam        Assessment & Plan:  Pain management, meds were refilled.  [name], MD

## 2019-08-01 MED FILL — ADDERALL XR 20 MG CAP SA: 20 | 30 days supply | Qty: 30 | Fill #0

## 2019-08-01 MED FILL — oxyCODONE HCL 10 MG TABS: 10 | 30 days supply | Qty: 120 | Fill #0

## 2019-08-17 MED FILL — CELECOXIB 200 MG CAP: 200 | 30 days supply | Qty: 60 | Fill #5

## 2019-08-20 ENCOUNTER — Encounter: Payer: Self-pay | Admitting: Family Medicine

## 2019-08-21 ENCOUNTER — Other Ambulatory Visit: Payer: Self-pay | Admitting: Family Medicine

## 2019-08-21 MED ORDER — VERAPAMIL HCL ER 120 MG PO TBCR: 120.0000 mg | EXTENDED_RELEASE_TABLET | Freq: Every day | ORAL | 2 refills | Status: DC

## 2019-08-21 MED FILL — VERAPAMIL ER 120 MG TABLET: 120 | 30 days supply | Qty: 30 | Fill #0

## 2019-08-29 ENCOUNTER — Other Ambulatory Visit (HOSPITAL_COMMUNITY): Payer: Self-pay | Admitting: Ophthalmology

## 2019-08-29 MED FILL — valACYclovir HCL 1 GM TABS: 1 | 90 days supply | Qty: 90 | Fill #0

## 2019-09-01 MED FILL — ADDERALL XR 20 MG CAP SA: 20 | 30 days supply | Qty: 30 | Fill #0

## 2019-09-01 MED FILL — oxyCODONE HCL 10 MG TABS: 10 | 30 days supply | Qty: 120 | Fill #0

## 2019-09-19 MED FILL — CELECOXIB 200 MG CAP: 200 | 30 days supply | Qty: 60 | Fill #6

## 2019-09-19 MED FILL — VERAPAMIL ER 120 MG TABLET: 120 | 30 days supply | Qty: 30 | Fill #1

## 2019-09-27 ENCOUNTER — Encounter: Payer: Self-pay | Admitting: Family Medicine

## 2019-09-27 DIAGNOSIS — M810 Age-related osteoporosis without current pathological fracture: Secondary | ICD-10-CM

## 2019-09-27 DIAGNOSIS — Z471 Aftercare following joint replacement surgery: Secondary | ICD-10-CM | POA: Diagnosis not present

## 2019-09-28 ENCOUNTER — Other Ambulatory Visit: Payer: Self-pay | Admitting: Family Medicine

## 2019-09-28 NOTE — Telephone Encounter (Signed)
Forwarding to PCP for approval  

## 2019-09-29 NOTE — Telephone Encounter (Signed)
The order was sent

## 2019-09-30 MED ORDER — AMPHETAMINE-DEXTROAMPHET ER 20 MG PO CP24: 20.0000 mg | ORAL_CAPSULE | Freq: Every day | ORAL | 0 refills | Status: DC

## 2019-09-30 MED FILL — ADDERALL XR 20 MG CAP SA: 20 | 30 days supply | Qty: 30 | Fill #0

## 2019-09-30 NOTE — Telephone Encounter (Signed)
Done

## 2019-10-03 ENCOUNTER — Other Ambulatory Visit: Payer: Self-pay | Admitting: Family Medicine

## 2019-10-03 DIAGNOSIS — Z1231 Encounter for screening mammogram for malignant neoplasm of breast: Secondary | ICD-10-CM

## 2019-10-03 DIAGNOSIS — M810 Age-related osteoporosis without current pathological fracture: Secondary | ICD-10-CM

## 2019-10-03 MED FILL — oxyCODONE HCL 10 MG TABS: 10 | 30 days supply | Qty: 120 | Fill #0

## 2019-10-18 MED FILL — CELECOXIB 200 MG CAP: 200 | 30 days supply | Qty: 60 | Fill #7

## 2019-10-18 MED FILL — VERAPAMIL ER 120 MG TABLET: 120 | 30 days supply | Qty: 30 | Fill #2

## 2019-10-19 MED FILL — TOBRAMYCIN 0.3 % SOLN: 0.3 | 12 days supply | Qty: 5 | Fill #1

## 2019-11-01 ENCOUNTER — Other Ambulatory Visit: Payer: Self-pay

## 2019-11-01 ENCOUNTER — Ambulatory Visit: Payer: 59 | Admitting: Family Medicine

## 2019-11-01 ENCOUNTER — Encounter: Payer: Self-pay | Admitting: Family Medicine

## 2019-11-01 ENCOUNTER — Other Ambulatory Visit: Payer: Self-pay | Admitting: Family Medicine

## 2019-11-01 VITALS — BP 170/100 | HR 75 | Temp 97.7°F | Ht 61.5 in | Wt 108.2 lb

## 2019-11-01 DIAGNOSIS — M2550 Pain in unspecified joint: Secondary | ICD-10-CM | POA: Diagnosis not present

## 2019-11-01 DIAGNOSIS — F1190 Opioid use, unspecified, uncomplicated: Secondary | ICD-10-CM | POA: Diagnosis not present

## 2019-11-01 MED ORDER — METHYLPREDNISOLONE ACETATE 40 MG/ML IJ SUSP
40.0000 mg | Freq: Once | INTRAMUSCULAR | Status: AC
  Administered 2019-11-01: 40 mg via INTRAMUSCULAR

## 2019-11-01 MED ORDER — METHYLPREDNISOLONE ACETATE 80 MG/ML IJ SUSP
80.0000 mg | Freq: Once | INTRAMUSCULAR | Status: AC
  Administered 2019-11-01: 80 mg via INTRAMUSCULAR

## 2019-11-01 MED ORDER — OXYCODONE HCL 10 MG PO TABS: 10.0000 mg | ORAL_TABLET | Freq: Four times a day (QID) | ORAL | 0 refills | Status: DC | PRN

## 2019-11-01 MED ORDER — OXYCODONE HCL 10 MG PO TABS: 10.0000 mg | ORAL_TABLET | Freq: Four times a day (QID) | ORAL | 0 refills | Status: AC | PRN

## 2019-11-01 MED ORDER — PREGABALIN 300 MG PO CAPS: 300.0000 mg | ORAL_CAPSULE | Freq: Two times a day (BID) | ORAL | 5 refills | Status: DC

## 2019-11-01 MED FILL — ADDERALL XR 20 MG CAP SA: 20 | 30 days supply | Qty: 30 | Fill #0

## 2019-11-01 MED FILL — oxyCODONE HCL 10 MG TABS: 10 | 30 days supply | Qty: 120 | Fill #0

## 2019-11-01 MED FILL — PREGABALIN 300 MG CAPS: 300 | 30 days supply | Qty: 60 | Fill #0

## 2019-11-01 NOTE — Progress Notes (Signed)
   Subjective:    Patient ID: [name], female    DOB: 02-01-1955, 64 y.o.   MRN: [id number]  HPI Here for pain management. She is really struggling with the pain in both shoulders. This is mostly a sharp pain that limits her ability to raise her arms. However in the past few months she has also had a severe "burning pain" that runs from the shoulders down both arms to the hands. The Oxycodone does not affect this pain very much. She has been taking Gabapentin for several years.  Indication for chronic opioid: osteoarthritis  Medication and dose: Oxycodone 10 mg  # pills per month: 120 Last UDS date: 02-10-19 Opioid Treatment Agreement signed (Y/N): 04-30-17 Opioid Treatment Agreement last reviewed with patient:  11-01-19 [facility] reviewed this encounter (include red flags): Yes    Review of Systems     Objective:   Physical Exam        Assessment & Plan:  Pain management. She seems to have a neuropathic pain now on top of the arthritic pain. We will continue the Oxycodone, but we will stop the Gabapentin and start her on Lyrica 300 mg BID. She is also given a shot of DepoMedrol today.  [name], MD

## 2019-11-06 ENCOUNTER — Telehealth: Payer: 59 | Admitting: Emergency Medicine

## 2019-11-06 DIAGNOSIS — L03119 Cellulitis of unspecified part of limb: Secondary | ICD-10-CM

## 2019-11-06 MED ORDER — DOXYCYCLINE HYCLATE 100 MG PO CAPS
100.0000 mg | ORAL_CAPSULE | Freq: Two times a day (BID) | ORAL | 0 refills | Status: DC
Start: 2019-11-06 — End: 2019-12-01

## 2019-11-06 NOTE — Progress Notes (Signed)
E Visit for Cellulitis  We are sorry that you are not feeling well. Here is how we plan to help!  Based on what you shared with me it looks like you have cellulitis.  Cellulitis looks like areas of skin redness, swelling, and warmth; it develops as a result of bacteria entering under the skin. Little red spots and/or bleeding can be seen in skin, and tiny surface sacs containing fluid can occur. Fever can be present. Cellulitis is almost always on one side of a body, and the lower limbs are the most common site of involvement.   I have prescribed: Doxycycline 100 mg tablets twice daily for 7 days.  HOME CARE:  . Take your medications as ordered and take all of them, even if the skin irritation appears to be healing.   GET HELP RIGHT AWAY IF:  . Symptoms that don't begin to go away within 48 hours. . Severe redness persists or worsens . If the area turns color, spreads or swells. . If it blisters and opens, develops yellow-brown crust or bleeds. . You develop a fever or chills. . If the pain increases or becomes unbearable.  . Are unable to keep fluids and food down.  MAKE SURE YOU    Understand these instructions.  Will watch your condition.  Will get help right away if you are not doing well or get worse.  Thank you for choosing an e-visit. Your e-visit answers were reviewed by a board certified advanced clinical practitioner to complete your personal care plan. Depending upon the condition, your plan could have included both over the counter or prescription medications. Please review your pharmacy choice. Make sure the pharmacy is open so you can pick up prescription now. If there is a problem, you may contact your provider through [employer] and have the prescription routed to another pharmacy. Your safety is important to [place]. If you have drug allergies check your prescription carefully.  For the next 24 hours you can use MyChart to ask questions about today's visit,  request a non-urgent call back, or ask for a work or school excuse. You will get an email in the next two days asking about your experience. I hope that your e-visit has been valuable and will speed your recovery.  **Please do not respond to this message unless you have follow up questions.** Greater than 5 but less than 10 minutes spent researching, coordinating, and implementing care for this patient today

## 2019-11-08 DIAGNOSIS — Z9842 Cataract extraction status, left eye: Secondary | ICD-10-CM | POA: Diagnosis not present

## 2019-11-08 DIAGNOSIS — Z970 Presence of artificial eye: Secondary | ICD-10-CM | POA: Diagnosis not present

## 2019-11-08 DIAGNOSIS — Z961 Presence of intraocular lens: Secondary | ICD-10-CM | POA: Diagnosis not present

## 2019-11-08 DIAGNOSIS — H26492 Other secondary cataract, left eye: Secondary | ICD-10-CM | POA: Diagnosis not present

## 2019-11-08 DIAGNOSIS — Z973 Presence of spectacles and contact lenses: Secondary | ICD-10-CM | POA: Diagnosis not present

## 2019-11-08 DIAGNOSIS — H04122 Dry eye syndrome of left lacrimal gland: Secondary | ICD-10-CM | POA: Diagnosis not present

## 2019-11-10 DIAGNOSIS — S0180XA Unspecified open wound of other part of head, initial encounter: Secondary | ICD-10-CM | POA: Diagnosis not present

## 2019-11-11 ENCOUNTER — Ambulatory Visit: Payer: 59 | Attending: Internal Medicine

## 2019-11-11 ENCOUNTER — Ambulatory Visit: Payer: 59

## 2019-11-11 DIAGNOSIS — Z23 Encounter for immunization: Secondary | ICD-10-CM

## 2019-11-11 NOTE — Progress Notes (Signed)
   Covid-19 Vaccination Clinic  Name:  [name]    MRN: [id number] DOB: 1955/12/31  11/11/2019  [name] was observed post Covid-19 immunization for 15 minutes without incident. She was provided with Vaccine Information Sheet and instruction to access the V-Safe system.   [name] was instructed to call 911 with any severe reactions post vaccine: Marland Kitchen Difficulty breathing  . Swelling of face and throat  . A fast heartbeat  . A bad rash all over body  . Dizziness and weakness

## 2019-11-24 ENCOUNTER — Telehealth: Payer: Self-pay | Admitting: *Deleted

## 2019-11-24 NOTE — Telephone Encounter (Signed)
PA for Pennsaid 2%solution denied. Per cover my meds This request has not been approved. We were asked to perform a prior authorization for coverage of the drug or product listed at the top of this letter under your pharmacy benefit. We denied this request because your plan does not allow certain drugs or classes of drugs to be covered under your pharmacy benefit. You may contact your plan for a list of excluded drugs or products. We denied this request based on the exclusions section of your health plan benefit documents. Please work with your provider to discuss other treatment options. Covered alternatives include diclofenac 1% gel and diclofenac 1.5% solution. A written notification letter will follow with additional details.

## 2019-11-25 ENCOUNTER — Encounter: Payer: Self-pay | Admitting: Family Medicine

## 2019-11-25 ENCOUNTER — Other Ambulatory Visit: Payer: Self-pay | Admitting: Family Medicine

## 2019-11-27 NOTE — Telephone Encounter (Signed)
Refill Verapamil and Celebrex 200 mg BID for one year

## 2019-11-28 ENCOUNTER — Other Ambulatory Visit: Payer: Self-pay | Admitting: Internal Medicine

## 2019-11-28 MED ORDER — CELECOXIB 200 MG PO CAPS
200.0000 mg | ORAL_CAPSULE | Freq: Two times a day (BID) | ORAL | 3 refills | Status: DC
Start: 2019-11-28 — End: 2019-11-28

## 2019-11-28 MED ORDER — VERAPAMIL HCL ER 120 MG PO TBCR: 120.0000 mg | EXTENDED_RELEASE_TABLET | Freq: Every day | ORAL | 3 refills | Status: DC

## 2019-11-28 MED FILL — CELECOXIB 200 MG CAP: 200 | 90 days supply | Qty: 180 | Fill #0

## 2019-11-28 MED FILL — valACYclovir HCL 1 GM TABS: 1 | 90 days supply | Qty: 90 | Fill #1

## 2019-11-28 MED FILL — VERAPAMIL ER 120 MG TABLET: 120 | 90 days supply | Qty: 90 | Fill #0

## 2019-11-28 NOTE — Addendum Note (Signed)
Addended by: Westley Hummer B on: 11/28/2019 12:05 PM   Modules accepted: Orders

## 2019-11-30 NOTE — Telephone Encounter (Signed)
Spoke with [employer] about denial. They will fax over form to be completed. Cover sheet needs to contain the following information : Member name and medication name. Fax back to [phone]. Member ID is [id number].

## 2019-12-01 ENCOUNTER — Telehealth (INDEPENDENT_AMBULATORY_CARE_PROVIDER_SITE_OTHER): Payer: 59 | Admitting: Family Medicine

## 2019-12-01 ENCOUNTER — Encounter: Payer: Self-pay | Admitting: Family Medicine

## 2019-12-01 VITALS — Ht 61.0 in | Wt 105.0 lb

## 2019-12-01 DIAGNOSIS — J40 Bronchitis, not specified as acute or chronic: Secondary | ICD-10-CM | POA: Diagnosis not present

## 2019-12-01 MED ORDER — ALBUTEROL SULFATE HFA 108 (90 BASE) MCG/ACT IN AERS
2.0000 | INHALATION_SPRAY | RESPIRATORY_TRACT | 0 refills | Status: DC | PRN
Start: 2019-12-01 — End: 2020-07-31

## 2019-12-01 MED ORDER — METHYLPREDNISOLONE 4 MG PO TBPK
ORAL_TABLET | ORAL | 0 refills | Status: DC
Start: 2019-12-01 — End: 2020-01-10

## 2019-12-01 MED ORDER — AMOXICILLIN-POT CLAVULANATE 875-125 MG PO TABS
1.0000 | ORAL_TABLET | Freq: Two times a day (BID) | ORAL | 0 refills | Status: DC
Start: 2019-12-01 — End: 2020-01-10

## 2019-12-01 NOTE — Progress Notes (Signed)
Subjective:    Patient ID: [name], female    DOB: 12-10-1955, 64 y.o.   MRN: [id number]  HPI Virtual Visit via Video Note  I connected with the patient on 12/01/19 at  3:30 PM EDT by a video enabled telemedicine application and verified that I am speaking with the correct person using two identifiers.  Location patient: home Location provider:work or home office Persons participating in the virtual visit: patient, provider  I discussed the limitations of evaluation and management by telemedicine and the availability of in person appointments. The patient expressed understanding and agreed to proceed.   HPI: Here for the onset yesterday of hoarse voice, chest tightness and coughing up green sputum. She is mildly SOB. No chest pain or fever. No body aches or NVD. Drinking fluids.    ROS: See pertinent positives and negatives per HPI.  Past Medical History:  Diagnosis Date  . ARDS (adult respiratory distress syndrome) ([place]) 2007  . Arthritis   . Depression   . Gallstones 01/07/2018  . Ganglion cyst of dorsum of right wrist   . Headache   . MRSA (methicillin resistant Staphylococcus aureus) 2007  . Osteoporosis   . Pneumonia 2009  . Prosthetic eye globe    right eye   . Raynaud's disease     Past Surgical History:  Procedure Laterality Date  . ANTERIOR CERVICAL DECOMP/DISCECTOMY FUSION N/A 12/12/2014   Procedure: ANTERIOR CERVICAL DECOMPRESSION/DISCECTOMY FUSION 3 LEVELS;  Surgeon: Phylliss Bob, MD;  Location: [place];  Service: Orthopedics;  Laterality: N/A;  Anterior cervical decompression fusion, cerivcal 5-6, cervical 6-7, cervical 7-thoracic 1 with instrumentation and allografrt  . APPENDECTOMY  1974  . CATARACT EXTRACTION Left 11/2018  . CHOLECYSTECTOMY N/A 01/07/2018   Procedure: LAPAROSCOPIC CHOLECYSTECTOMY WITH INTRAOPERATIVE CHOLANGIOGRAM;  Surgeon: Fanny Skates, MD;  Location: [place];  Service: General;  Laterality: N/A;  . COLONOSCOPY   04/14/2013   per Dr. Ardis Hughs, clear, repeat in 10 yrs   . CORNEAL TRANSPLANT Right 2007   x4  . ENUCLEATION Right 2013   eye infection  . GANGLION CYST EXCISION Right 05/25/2016   Procedure: RIGHT WRIST ULNAR GANGLION EXCISION;  Surgeon: Milly Jakob, MD;  Location: [place];  Service: Orthopedics;  Laterality: Right;  . RHINOPLASTY  1980  . TONSILLECTOMY     removed in 3rd grade  . TOTAL SHOULDER ARTHROPLASTY Left 03/13/2019   Procedure: TOTAL SHOULDER ARTHROPLASTY;  Surgeon: Tania Ade, MD;  Location: [place];  Service: Orthopedics;  Laterality: Left;    Family History  Problem Relation Age of Onset  . Breast cancer Maternal Aunt   . Colon cancer Neg Hx      Current Outpatient Medications:  .  amphetamine-dextroamphetamine (ADDERALL XR) 20 MG 24 hr capsule, Take 1 capsule (20 mg total) by mouth daily., Disp: 30 capsule, Rfl: 0 .  B Complex-C (SUPER B COMPLEX PO), Take 1 tablet by mouth daily., Disp: , Rfl:  .  Calcium Carbonate-Vitamin D (CALCIUM 600+D) 600-400 MG-UNIT tablet, Take 1 tablet by mouth daily. , Disp: , Rfl:  .  Carboxymethylcellul-Glycerin (REFRESH OPTIVE PF OP), Apply 1 drop to eye daily as needed (dry eyes)., Disp: , Rfl:  .  celecoxib (CELEBREX) 200 MG capsule, Take 1 capsule (200 mg total) by mouth 2 (two) times daily., Disp: 180 capsule, Rfl: 3 .  Diclofenac Sodium 2 % SOLN, Apply 1 application topically 4 (four) times daily., Disp: 112 g, Rfl: 11 .  nystatin cream (MYCOSTATIN), Apply  to affected area 2 times daily, Disp: 30 g, Rfl: 0 .  [START ON 01/01/2020] Oxycodone HCl 10 MG TABS, Take 1 tablet (10 mg total) by mouth every 6 (six) hours as needed (pain)., Disp: 120 tablet, Rfl: 0 .  polyethylene glycol powder (GLYCOLAX/MIRALAX) powder, TAKE 17 GRAMS BY MOUTH 2 TIMES DAILY AS NEEDED. (Patient taking differently: Take 0.5 Containers by mouth daily as needed for moderate constipation. ), Disp: 3162 g, Rfl: 1 .  pregabalin  (LYRICA) 300 MG capsule, Take 1 capsule (300 mg total) by mouth 2 (two) times daily., Disp: 60 capsule, Rfl: 5 .  tobramycin (TOBREX) 0.3 % ophthalmic solution, Place 2 drops into the right eye every 4 (four) hours., Disp: 5 mL, Rfl: 1 .  valACYclovir (VALTREX) 1000 MG tablet, Take 1,000 mg by mouth daily. , Disp: , Rfl:  .  verapamil (CALAN-SR) 120 MG CR tablet, Take 1 tablet (120 mg total) by mouth at bedtime., Disp: 90 tablet, Rfl: 3 .  albuterol (VENTOLIN HFA) 108 (90 Base) MCG/ACT inhaler, Inhale 2 puffs into the lungs every 4 (four) hours as needed for wheezing or shortness of breath., Disp: 18 g, Rfl: 0 .  amoxicillin-clavulanate (AUGMENTIN) 875-125 MG tablet, Take 1 tablet by mouth 2 (two) times daily., Disp: 20 tablet, Rfl: 0 .  methylPREDNISolone (MEDROL DOSEPAK) 4 MG TBPK tablet, As directed, Disp: 21 tablet, Rfl: 0  EXAM:  VITALS per patient if applicable:  GENERAL: alert, oriented, appears well and in no acute distress  HEENT: atraumatic, conjunttiva clear, no obvious abnormalities on inspection of external nose and ears  NECK: normal movements of the head and neck  LUNGS: on inspection no signs of respiratory distress, breathing rate appears normal, no obvious gross SOB, gasping or wheezing  CV: no obvious cyanosis  MS: moves all visible extremities without noticeable abnormality  PSYCH/NEURO: pleasant and cooperative, no obvious depression or anxiety, speech and thought processing grossly intact  ASSESSMENT AND PLAN: Bronchitis, treat with Augmentin and a Medrol dose pack. Add a Ventolin inhaler prn. I asked her to get tested for the Covid-19 virus and she agreed. If she gets any worse this weekend she knows to go to urgent care or the ER.  [name], MD  Discussed the following assessment and plan:  No diagnosis found.     I discussed the assessment and treatment plan with the patient. The patient was provided an opportunity to ask questions and all were  answered. The patient agreed with the plan and demonstrated an understanding of the instructions.   The patient was advised to call back or seek an in-person evaluation if the symptoms worsen or if the condition fails to improve as anticipated.     Review of Systems     Objective:   Physical Exam        Assessment & Plan:

## 2019-12-02 MED FILL — oxyCODONE HCL 10 MG TABS: 10 | 30 days supply | Qty: 120 | Fill #0

## 2019-12-02 MED FILL — ADDERALL XR 20 MG CAP SA: 20 | 30 days supply | Qty: 30 | Fill #0

## 2019-12-02 MED FILL — PREGABALIN 300 MG CAPS: 300 | 30 days supply | Qty: 60 | Fill #1

## 2020-01-01 ENCOUNTER — Other Ambulatory Visit: Payer: Self-pay | Admitting: Family Medicine

## 2020-01-01 MED FILL — oxyCODONE HCL 10 MG TABS: 10 | 30 days supply | Qty: 120 | Fill #0

## 2020-01-01 MED FILL — PREGABALIN 300 MG CAPS: 300 | 30 days supply | Qty: 60 | Fill #2

## 2020-01-03 ENCOUNTER — Other Ambulatory Visit: Payer: Self-pay | Admitting: Family Medicine

## 2020-01-03 MED ORDER — AMPHETAMINE-DEXTROAMPHET ER 20 MG PO CP24: 20.0000 mg | ORAL_CAPSULE | Freq: Every day | ORAL | 0 refills | Status: DC

## 2020-01-03 MED FILL — ADDERALL XR 20 MG CAP SA: 20 | 30 days supply | Qty: 30 | Fill #0

## 2020-01-03 NOTE — Telephone Encounter (Signed)
Done

## 2020-01-10 ENCOUNTER — Ambulatory Visit: Payer: 59 | Admitting: Family Medicine

## 2020-01-10 ENCOUNTER — Telehealth (INDEPENDENT_AMBULATORY_CARE_PROVIDER_SITE_OTHER): Payer: 59 | Admitting: Family Medicine

## 2020-01-10 ENCOUNTER — Encounter: Payer: Self-pay | Admitting: Family Medicine

## 2020-01-10 DIAGNOSIS — T63301D Toxic effect of unspecified spider venom, accidental (unintentional), subsequent encounter: Secondary | ICD-10-CM | POA: Diagnosis not present

## 2020-01-10 MED ORDER — MUPIROCIN 2 % EX OINT: 1.0000 | TOPICAL_OINTMENT | Freq: Two times a day (BID) | CUTANEOUS | 1 refills | Status: DC

## 2020-01-10 NOTE — Progress Notes (Signed)
Subjective:    Patient ID: [name], female    DOB: 10-07-1955, 64 y.o.   MRN: [id number]  HPI Virtual Visit via Video Note  I connected with the patient on 01/10/20 at 10:30 AM EST by a video enabled telemedicine application and verified that I am speaking with the correct person using two identifiers.  Location patient: home Location provider:work or home office Persons participating in the virtual visit: patient, provider  I discussed the limitations of evaluation and management by telemedicine and the availability of in person appointments. The patient expressed understanding and agreed to proceed.   HPI: Here for a lesion on the left lower leg. This started about 2 months ago with a small blister. No hx of trauma. It has never been painful. She had an e visit on 11-06-19 for this and it was felt to be cellulitis from a spider bite. She was given 7 days of Doxycycline. It did not seem to get much better, bit the blister went away and a small ulcer formed at the site. Now in the last week it seems to be getting smaller. No fever.    ROS: See pertinent positives and negatives per HPI.  Past Medical History:  Diagnosis Date  . ARDS (adult respiratory distress syndrome) ([place]) 2007  . Arthritis   . Depression   . Gallstones 01/07/2018  . Ganglion cyst of dorsum of right wrist   . Headache   . MRSA (methicillin resistant Staphylococcus aureus) 2007  . Osteoporosis   . Pneumonia 2009  . Prosthetic eye globe    right eye   . Raynaud's disease     Past Surgical History:  Procedure Laterality Date  . ANTERIOR CERVICAL DECOMP/DISCECTOMY FUSION N/A 12/12/2014   Procedure: ANTERIOR CERVICAL DECOMPRESSION/DISCECTOMY FUSION 3 LEVELS;  Surgeon: Phylliss Bob, MD;  Location: [place];  Service: Orthopedics;  Laterality: N/A;  Anterior cervical decompression fusion, cerivcal 5-6, cervical 6-7, cervical 7-thoracic 1 with instrumentation and allografrt  . APPENDECTOMY  1974  .  CATARACT EXTRACTION Left 11/2018  . CHOLECYSTECTOMY N/A 01/07/2018   Procedure: LAPAROSCOPIC CHOLECYSTECTOMY WITH INTRAOPERATIVE CHOLANGIOGRAM;  Surgeon: Fanny Skates, MD;  Location: [place];  Service: General;  Laterality: N/A;  . COLONOSCOPY  04/14/2013   per Dr. Ardis Hughs, clear, repeat in 10 yrs   . CORNEAL TRANSPLANT Right 2007   x4  . ENUCLEATION Right 2013   eye infection  . GANGLION CYST EXCISION Right 05/25/2016   Procedure: RIGHT WRIST ULNAR GANGLION EXCISION;  Surgeon: Milly Jakob, MD;  Location: [place];  Service: Orthopedics;  Laterality: Right;  . RHINOPLASTY  1980  . TONSILLECTOMY     removed in 3rd grade  . TOTAL SHOULDER ARTHROPLASTY Left 03/13/2019   Procedure: TOTAL SHOULDER ARTHROPLASTY;  Surgeon: Tania Ade, MD;  Location: [place];  Service: Orthopedics;  Laterality: Left;    Family History  Problem Relation Age of Onset  . Breast cancer Maternal Aunt   . Colon cancer Neg Hx      Current Outpatient Medications:  .  albuterol (VENTOLIN HFA) 108 (90 Base) MCG/ACT inhaler, Inhale 2 puffs into the lungs every 4 (four) hours as needed for wheezing or shortness of breath., Disp: 18 g, Rfl: 0 .  [START ON 03/05/2020] amphetamine-dextroamphetamine (ADDERALL XR) 20 MG 24 hr capsule, Take 1 capsule (20 mg total) by mouth daily., Disp: 30 capsule, Rfl: 0 .  B Complex-C (SUPER B COMPLEX PO), Take 1 tablet by mouth daily., Disp: , Rfl:  .  Calcium Carbonate-Vitamin D 600-400 MG-UNIT tablet, Take 1 tablet by mouth daily. , Disp: , Rfl:  .  Carboxymethylcellul-Glycerin (REFRESH OPTIVE PF OP), Apply 1 drop to eye daily as needed (dry eyes)., Disp: , Rfl:  .  celecoxib (CELEBREX) 200 MG capsule, Take 1 capsule (200 mg total) by mouth 2 (two) times daily., Disp: 180 capsule, Rfl: 3 .  Diclofenac Sodium 2 % SOLN, Apply 1 application topically 4 (four) times daily., Disp: 112 g, Rfl: 11 .  nystatin cream (MYCOSTATIN), Apply to affected area 2  times daily, Disp: 30 g, Rfl: 0 .  Oxycodone HCl 10 MG TABS, Take 1 tablet (10 mg total) by mouth every 6 (six) hours as needed (pain)., Disp: 120 tablet, Rfl: 0 .  polyethylene glycol powder (GLYCOLAX/MIRALAX) powder, TAKE 17 GRAMS BY MOUTH 2 TIMES DAILY AS NEEDED. (Patient taking differently: Take 0.5 Containers by mouth daily as needed for moderate constipation.), Disp: 3162 g, Rfl: 1 .  pregabalin (LYRICA) 300 MG capsule, Take 1 capsule (300 mg total) by mouth 2 (two) times daily., Disp: 60 capsule, Rfl: 5 .  tobramycin (TOBREX) 0.3 % ophthalmic solution, Place 2 drops into the right eye every 4 (four) hours., Disp: 5 mL, Rfl: 1 .  valACYclovir (VALTREX) 1000 MG tablet, Take 1,000 mg by mouth daily., Disp: , Rfl:  .  verapamil (CALAN-SR) 120 MG CR tablet, Take 1 tablet (120 mg total) by mouth at bedtime., Disp: 90 tablet, Rfl: 3 .  mupirocin ointment (BACTROBAN) 2 %, Apply 1 application topically 2 (two) times daily., Disp: 22 g, Rfl: 1  EXAM:  VITALS per patient if applicable:  GENERAL: alert, oriented, appears well and in no acute distress  HEENT: atraumatic, conjunttiva clear, no obvious abnormalities on inspection of external nose and ears  NECK: normal movements of the head and neck  LUNGS: on inspection no signs of respiratory distress, breathing rate appears normal, no obvious gross SOB, gasping or wheezing  CV: no obvious cyanosis  MS: moves all visible extremities without noticeable abnormality  PSYCH/NEURO: pleasant and cooperative, no obvious depression or anxiety, speech and thought processing grossly intact Skin: the left lower leg has a small cavitary lesion which is ulcerated. No drainage. No erythema.  ASSESSMENT AND PLAN: Spider bite, likely from a brown recluse spider. This is not infected at this point. She will dress it daily with Mupiricin ointment. Recheck as needed.  [name], MD  Discussed the following assessment and plan:  No diagnosis found.      I discussed the assessment and treatment plan with the patient. The patient was provided an opportunity to ask questions and all were answered. The patient agreed with the plan and demonstrated an understanding of the instructions.   The patient was advised to call back or seek an in-person evaluation if the symptoms worsen or if the condition fails to improve as anticipated.     Review of Systems     Objective:   Physical Exam        Assessment & Plan:

## 2020-01-16 ENCOUNTER — Ambulatory Visit
Admission: RE | Admit: 2020-01-16 | Discharge: 2020-01-16 | Disposition: A | Discharge status: Home / Self Care | Payer: 59 | Source: Ambulatory Visit | Attending: Family Medicine | Admitting: Family Medicine

## 2020-01-16 ENCOUNTER — Other Ambulatory Visit: Payer: Self-pay

## 2020-01-16 DIAGNOSIS — M810 Age-related osteoporosis without current pathological fracture: Secondary | ICD-10-CM

## 2020-01-16 DIAGNOSIS — Z1231 Encounter for screening mammogram for malignant neoplasm of breast: Secondary | ICD-10-CM | POA: Diagnosis not present

## 2020-01-16 DIAGNOSIS — Z780 Asymptomatic menopausal state: Secondary | ICD-10-CM | POA: Diagnosis not present

## 2020-01-16 DIAGNOSIS — M8589 Other specified disorders of bone density and structure, multiple sites: Secondary | ICD-10-CM | POA: Diagnosis not present

## 2020-01-18 ENCOUNTER — Telehealth: Payer: 59 | Admitting: Emergency Medicine

## 2020-01-18 DIAGNOSIS — K1379 Other lesions of oral mucosa: Secondary | ICD-10-CM

## 2020-01-18 NOTE — Progress Notes (Signed)
Based on what you shared with me, I feel your condition warrants further evaluation and I recommend that you be seen for a face to face office visit.  The description of your symptoms involving your mouth, tongue, and throat worry me.  I'm afraid that I can't adequately diagnose or treat your condition through and e-visit.  I recommend that you go to one of the urgent care centers below.   NOTE: If you entered your credit card information for this eVisit, you will not be charged. You may see a "hold" on your card for the $35 but that hold will drop off and you will not have a charge processed.   If you are having a true medical emergency please call 911.      For an urgent face to face visit, [facility] has five urgent care centers for your convenience:     [facility] [place] [facility] [place] Get Driving Directions [phone] [address] [place], [place] [address]  10 am - 6pm Monday - Friday    [facility]) Get Driving Directions [phone] [place], [place] [address]  10 am to 8 pm Monday-Friday  12 pm to 8 pm [facility] Get Driving Directions [phone] [employer] [place], [place], [place] [address]  8 am to 8 pm Monday-Friday  9 am to 6 pm Saturday  11 am to 6 pm Sunday     [facility] Get Driving Directions  [phone] [address] [place], [place] [address]  8 am to 8 pm Monday-Friday  8 am to 4 pm [facility] at [place] Get Driving Directions [phone] [address] [place], [place] [address]  12 pm to 6 pm Monday-Friday      Your e-visit answers were reviewed by a board certified advanced clinical practitioner to complete your personal care plan.  Thank you for using e-Visits.    Approximately 5 minutes was used in  reviewing the patient's chart, questionnaire, prescribing medications, and documentation.

## 2020-02-02 ENCOUNTER — Encounter: Payer: Self-pay | Admitting: Family Medicine

## 2020-02-02 ENCOUNTER — Other Ambulatory Visit: Payer: Self-pay | Admitting: Family Medicine

## 2020-02-02 ENCOUNTER — Telehealth (INDEPENDENT_AMBULATORY_CARE_PROVIDER_SITE_OTHER): Payer: 59 | Admitting: Family Medicine

## 2020-02-02 DIAGNOSIS — N8110 Cystocele, unspecified: Secondary | ICD-10-CM | POA: Diagnosis not present

## 2020-02-02 DIAGNOSIS — M2550 Pain in unspecified joint: Secondary | ICD-10-CM | POA: Diagnosis not present

## 2020-02-02 DIAGNOSIS — F1190 Opioid use, unspecified, uncomplicated: Secondary | ICD-10-CM | POA: Diagnosis not present

## 2020-02-02 MED ORDER — OXYCODONE HCL 10 MG PO TABS: 10.0000 mg | ORAL_TABLET | Freq: Four times a day (QID) | ORAL | 0 refills | Status: DC | PRN

## 2020-02-02 MED FILL — PREGABALIN 300 MG CAPS: 300 | 30 days supply | Qty: 60 | Fill #3

## 2020-02-02 MED FILL — oxyCODONE HCL 10 MG TABS: 10 | 30 days supply | Qty: 120 | Fill #0

## 2020-02-02 NOTE — Progress Notes (Addendum)
Subjective:    Patient ID: [name], female    DOB: 01-02-1956, 65 y.o.   MRN: [id number]  HPI Here for pain management, she is doing well. At our last visit we switched from Gabapentin to Lyrica for neuropathic pains in the arms and hands, and this has worked very well for her. She also mentions a new problem, saying that her bladder is dropping. She first noticed this a year ago, but it has not bothered her until the past month. In that time the bladder has dropped much further and now she has some urinary incontinence because of this. No pain or burning.  Indication for chronic opioid: OA Medication and dose: Oxycodone 10 mg  # pills per month: 120 Last UDS date: 02-10-19 Opioid Treatment Agreement signed (Y/N): 04-30-17 Opioid Treatment Agreement last reviewed with patient:  02-02-20 NCCSRS reviewed this encounter (include red flags): Yes Virtual Visit via Video Note  I connected with the patient on 02/06/20 at 11:00 AM EST by a video enabled telemedicine application and verified that I am speaking with the correct person using two identifiers.  Location patient: home Location provider:work or home office Persons participating in the virtual visit: patient, provider  I discussed the limitations of evaluation and management by telemedicine and the availability of in person appointments. The patient expressed understanding and agreed to proceed.   HPI:    ROS: See pertinent positives and negatives per HPI.  Past Medical History:  Diagnosis Date   ARDS (adult respiratory distress syndrome) ([place]) 2007   Arthritis    Depression    Gallstones 01/07/2018   Ganglion cyst of dorsum of right wrist    Headache    MRSA (methicillin resistant Staphylococcus aureus) 2007   Osteoporosis    Pneumonia 2009   Prosthetic eye globe    right eye    Raynaud's disease     Past Surgical History:  Procedure Laterality Date   ANTERIOR CERVICAL DECOMP/DISCECTOMY FUSION  N/A 12/12/2014   Procedure: ANTERIOR CERVICAL DECOMPRESSION/DISCECTOMY FUSION 3 LEVELS;  Surgeon: Phylliss Bob, MD;  Location: [facility];  Service: Orthopedics;  Laterality: N/A;  Anterior cervical decompression fusion, cerivcal 5-6, cervical 6-7, cervical 7-thoracic 1 with instrumentation and allografrt   APPENDECTOMY  1974   CATARACT EXTRACTION Left 11/2018   CHOLECYSTECTOMY N/A 01/07/2018   Procedure: LAPAROSCOPIC CHOLECYSTECTOMY WITH INTRAOPERATIVE CHOLANGIOGRAM;  Surgeon: Fanny Skates, MD;  Location: [facility];  Service: General;  Laterality: N/A;   COLONOSCOPY  04/14/2013   per Dr. Ardis Hughs, clear, repeat in 10 yrs    CORNEAL TRANSPLANT Right 2007   x4   ENUCLEATION Right 2013   eye infection   GANGLION CYST EXCISION Right 05/25/2016   Procedure: RIGHT WRIST ULNAR GANGLION EXCISION;  Surgeon: Milly Jakob, MD;  Location: [facility];  Service: Orthopedics;  Laterality: Right;   [place]     removed in 3rd grade   TOTAL SHOULDER ARTHROPLASTY Left 03/13/2019   Procedure: TOTAL SHOULDER ARTHROPLASTY;  Surgeon: Tania Ade, MD;  Location: [facility];  Service: Orthopedics;  Laterality: Left;    Family History  Problem Relation Age of Onset   Breast cancer Maternal Aunt    Colon cancer Neg Hx      Current Outpatient Medications:    albuterol (VENTOLIN HFA) 108 (90 Base) MCG/ACT inhaler, Inhale 2 puffs into the lungs every 4 (four) hours as needed for wheezing or shortness of breath., Disp: 18 g, Rfl: 0   [  START ON 03/05/2020] amphetamine-dextroamphetamine (ADDERALL XR) 20 MG 24 hr capsule, Take 1 capsule (20 mg total) by mouth daily., Disp: 30 capsule, Rfl: 0   B Complex-C (SUPER B COMPLEX PO), Take 1 tablet by mouth daily., Disp: , Rfl:    Calcium Carbonate-Vitamin D 600-400 MG-UNIT tablet, Take 1 tablet by mouth daily. , Disp: , Rfl:    Carboxymethylcellul-Glycerin (REFRESH OPTIVE PF OP), Apply 1 drop to eye daily  as needed (dry eyes)., Disp: , Rfl:    celecoxib (CELEBREX) 200 MG capsule, Take 1 capsule (200 mg total) by mouth 2 (two) times daily., Disp: 180 capsule, Rfl: 3   Diclofenac Sodium 2 % SOLN, Apply 1 application topically 4 (four) times daily., Disp: 112 g, Rfl: 11   mupirocin ointment (BACTROBAN) 2 %, Apply 1 application topically 2 (two) times daily., Disp: 22 g, Rfl: 1   nystatin cream (MYCOSTATIN), Apply to affected area 2 times daily, Disp: 30 g, Rfl: 0   polyethylene glycol powder (GLYCOLAX/MIRALAX) powder, TAKE 17 GRAMS BY MOUTH 2 TIMES DAILY AS NEEDED. (Patient taking differently: Take 0.5 Containers by mouth daily as needed for moderate constipation.), Disp: 3162 g, Rfl: 1   pregabalin (LYRICA) 300 MG capsule, Take 1 capsule (300 mg total) by mouth 2 (two) times daily., Disp: 60 capsule, Rfl: 5   tobramycin (TOBREX) 0.3 % ophthalmic solution, Place 2 drops into the right eye every 4 (four) hours., Disp: 5 mL, Rfl: 1   valACYclovir (VALTREX) 1000 MG tablet, Take 1,000 mg by mouth daily., Disp: , Rfl:    verapamil (CALAN-SR) 120 MG CR tablet, Take 1 tablet (120 mg total) by mouth at bedtime., Disp: 90 tablet, Rfl: 3   [START ON 04/01/2020] Oxycodone HCl 10 MG TABS, Take 1 tablet (10 mg total) by mouth every 6 (six) hours as needed (pain)., Disp: 120 tablet, Rfl: 0  EXAM:  VITALS per patient if applicable:  GENERAL: alert, oriented, appears well and in no acute distress  HEENT: atraumatic, conjunttiva clear, no obvious abnormalities on inspection of external nose and ears  NECK: normal movements of the head and neck  LUNGS: on inspection no signs of respiratory distress, breathing rate appears normal, no obvious gross SOB, gasping or wheezing  CV: no obvious cyanosis  MS: moves all visible extremities without noticeable abnormality  PSYCH/NEURO: pleasant and cooperative, no obvious depression or anxiety, speech and thought processing grossly intact  ASSESSMENT AND  PLAN:  Discussed the following assessment and plan:  Chronic narcotic use  Bladder prolapse, female, acquired - Plan: Ambulatory referral to Urology  Arthralgia of multiple joints     I discussed the assessment and treatment plan with the patient. The patient was provided an opportunity to ask questions and all were answered. The patient agreed with the plan and demonstrated an understanding of the instructions.   The patient was advised to call back or seek an in-person evaluation if the symptoms worsen or if the condition fails to improve as anticipated.     Review of Systems     Objective:   Physical Exam        Assessment & Plan:  For pain management, we will refill the Oxycodone. For the bladder prolapse, we will refer her to Urology. [name], MD

## 2020-02-03 MED FILL — ADDERALL XR 20 MG CAP SA: 20 | 30 days supply | Qty: 30 | Fill #0

## 2020-02-29 MED FILL — valACYclovir HCL 1 GM TABS: 1 | 90 days supply | Qty: 90 | Fill #2

## 2020-03-01 MED FILL — CELECOXIB 200 MG CAP: 200 | 90 days supply | Qty: 180 | Fill #1

## 2020-03-04 MED FILL — PREGABALIN 300 MG CAPS: 300 | 30 days supply | Qty: 60 | Fill #4

## 2020-03-04 MED FILL — oxyCODONE HCL 10 MG TABS: 10 | 30 days supply | Qty: 120 | Fill #0

## 2020-03-05 MED FILL — ADDERALL XR 20 MG CAP SA: 20 | 30 days supply | Qty: 30 | Fill #0

## 2020-03-11 ENCOUNTER — Encounter: Payer: Self-pay | Admitting: Family Medicine

## 2020-03-11 ENCOUNTER — Ambulatory Visit: Payer: 59 | Admitting: Family Medicine

## 2020-03-11 ENCOUNTER — Ambulatory Visit (INDEPENDENT_AMBULATORY_CARE_PROVIDER_SITE_OTHER): Payer: 59

## 2020-03-11 ENCOUNTER — Other Ambulatory Visit: Payer: Self-pay

## 2020-03-11 VITALS — BP 150/86 | HR 77 | Temp 97.4°F | Ht 61.0 in | Wt 111.6 lb

## 2020-03-11 DIAGNOSIS — M898X6 Other specified disorders of bone, lower leg: Secondary | ICD-10-CM | POA: Diagnosis not present

## 2020-03-11 DIAGNOSIS — S8012XA Contusion of left lower leg, initial encounter: Secondary | ICD-10-CM | POA: Diagnosis not present

## 2020-03-11 DIAGNOSIS — S8002XA Contusion of left knee, initial encounter: Secondary | ICD-10-CM

## 2020-03-11 DIAGNOSIS — M7989 Other specified soft tissue disorders: Secondary | ICD-10-CM | POA: Diagnosis not present

## 2020-03-11 DIAGNOSIS — M85862 Other specified disorders of bone density and structure, left lower leg: Secondary | ICD-10-CM | POA: Diagnosis not present

## 2020-03-11 DIAGNOSIS — M79605 Pain in left leg: Secondary | ICD-10-CM

## 2020-03-11 NOTE — Progress Notes (Signed)
   Subjective:    Patient ID: [name], female    DOB: 02/20/1955, 65 y.o.   MRN: [id number]  HPI Here for injuries she sustained while jogging 2 weeks ago. She tripped over some uneven pavement and fell forward, landing directly on the left knee. She has had swelling and pain in the anterior knee ever since, though she can walk on it without too much difficulty. She has also developed a lot of swelling throughout the lower leg beneath the knee. No chest pain or SOB. She is worried about a possible blood clot. She has never had a thrombus.    Review of Systems  Constitutional: Negative.   Respiratory: Negative.   Cardiovascular: Positive for leg swelling. Negative for chest pain and palpitations.  Musculoskeletal: Positive for arthralgias and joint swelling.       Objective:   Physical Exam Constitutional:      General: She is not in acute distress.    Appearance: Normal appearance.     Comments: She walks normally  Cardiovascular:     Rate and Rhythm: Normal rate and regular rhythm.     Pulses: Normal pulses.     Heart sounds: Normal heart sounds.  Pulmonary:     Effort: Pulmonary effort is normal.     Breath sounds: Normal breath sounds.  Musculoskeletal:     Comments: The left anterior knee is swollen and ecchymotic. She is tender over the patella and the tibial tubercle. ROM is full. The entire leg below the knee is swollen and eechymotic. The calf is tender, but there are no cords felt. Bevelyn Buckles is negative   Neurological:     Mental Status: She is alert.           Assessment & Plan:  Knee contusion. We will get Xrays today to look for fractures. Refer to Orthopedics. We will also set up a venous doppler to rule out a DVT. She does not require anything for pain beyond her usual medications.  [name], MD

## 2020-03-12 ENCOUNTER — Telehealth: Payer: Self-pay

## 2020-03-12 DIAGNOSIS — N390 Urinary tract infection, site not specified: Secondary | ICD-10-CM | POA: Diagnosis not present

## 2020-03-12 DIAGNOSIS — N8111 Cystocele, midline: Secondary | ICD-10-CM | POA: Diagnosis not present

## 2020-03-12 DIAGNOSIS — B961 Klebsiella pneumoniae [K. pneumoniae] as the cause of diseases classified elsewhere: Secondary | ICD-10-CM | POA: Diagnosis not present

## 2020-03-12 DIAGNOSIS — R350 Frequency of micturition: Secondary | ICD-10-CM | POA: Diagnosis not present

## 2020-03-12 DIAGNOSIS — N3946 Mixed incontinence: Secondary | ICD-10-CM | POA: Diagnosis not present

## 2020-03-12 NOTE — Telephone Encounter (Signed)
Patient is calling in wondering about her x-rays results, asked for a mychart message in return.

## 2020-03-13 NOTE — Telephone Encounter (Signed)
Results have been discussed with patient

## 2020-03-14 ENCOUNTER — Ambulatory Visit (HOSPITAL_COMMUNITY)
Admission: RE | Admit: 2020-03-14 | Discharge: 2020-03-14 | Disposition: A | Discharge status: Home / Self Care | Payer: 59 | Source: Ambulatory Visit | Attending: Cardiology | Admitting: Cardiology

## 2020-03-14 ENCOUNTER — Other Ambulatory Visit: Payer: Self-pay

## 2020-03-14 DIAGNOSIS — M79605 Pain in left leg: Secondary | ICD-10-CM | POA: Insufficient documentation

## 2020-03-18 ENCOUNTER — Other Ambulatory Visit (HOSPITAL_COMMUNITY): Payer: Self-pay | Admitting: Urology

## 2020-03-18 MED FILL — SULFAMETHOXAZOLE-TMP DS TAB: 800-160 | 7 days supply | Qty: 14 | Fill #0

## 2020-03-19 DIAGNOSIS — M79662 Pain in left lower leg: Secondary | ICD-10-CM | POA: Diagnosis not present

## 2020-03-19 DIAGNOSIS — M25552 Pain in left hip: Secondary | ICD-10-CM | POA: Diagnosis not present

## 2020-03-19 DIAGNOSIS — M1712 Unilateral primary osteoarthritis, left knee: Secondary | ICD-10-CM | POA: Diagnosis not present

## 2020-03-26 ENCOUNTER — Telehealth: Payer: Self-pay

## 2020-03-26 NOTE — Telephone Encounter (Signed)
The office has received a form from [employer] for pt medical leave. Form has been placed on Dr Sarajane Jews folder for completing

## 2020-03-28 ENCOUNTER — Telehealth: Payer: Self-pay

## 2020-03-28 NOTE — Telephone Encounter (Signed)
Spoke with pt state that [employer] must have sent  Dr Sarajane Jews the [facility]  Form by error, state that she requested the forms be sent to her mother Dr, Pt state that this request was approved by pt mothers Dr and advised that Dr Sarajane Jews disregard this request.

## 2020-04-01 MED FILL — oxyCODONE HCL 10 MG TABS: 10 | 30 days supply | Qty: 120 | Fill #0

## 2020-04-01 MED FILL — PREGABALIN 300 MG CAPS: 300 | 30 days supply | Qty: 60 | Fill #5

## 2020-04-02 ENCOUNTER — Other Ambulatory Visit: Payer: Self-pay | Admitting: Family Medicine

## 2020-04-03 ENCOUNTER — Other Ambulatory Visit: Payer: Self-pay | Admitting: Family Medicine

## 2020-04-03 MED ORDER — AMPHETAMINE-DEXTROAMPHET ER 20 MG PO CP24: 20.0000 mg | ORAL_CAPSULE | Freq: Every day | ORAL | 0 refills | Status: DC

## 2020-04-03 MED FILL — ADDERALL XR 20 MG CAP SA: 20 | 30 days supply | Qty: 30 | Fill #0

## 2020-04-03 NOTE — Telephone Encounter (Signed)
Done

## 2020-04-09 DIAGNOSIS — M25522 Pain in left elbow: Secondary | ICD-10-CM | POA: Diagnosis not present

## 2020-04-09 DIAGNOSIS — M7712 Lateral epicondylitis, left elbow: Secondary | ICD-10-CM | POA: Diagnosis not present

## 2020-04-15 ENCOUNTER — Telehealth: Payer: 59 | Admitting: Family

## 2020-04-15 DIAGNOSIS — B370 Candidal stomatitis: Secondary | ICD-10-CM

## 2020-04-15 MED ORDER — NYSTATIN 100000 UNIT/ML MT SUSP: 5.0000 mL | Freq: Four times a day (QID) | OROMUCOSAL | 1 refills | Status: DC

## 2020-04-15 NOTE — Progress Notes (Signed)
E-Visit for Mouth Ulcers  We are sorry that you are not feeling well.  Here is how we plan to help!  Based on what you have shared with me, it appears that you have oral thrush.   I have sent a prescription of nystatin mouthwash that you swish and swallow up to four times a day. Keep your follow up with your provider.  Mouth ulcers are painful areas in the mouth and gums. These are also known as "canker sores".  They can occur anywhere inside the mouth. While mostly harmless, mouth ulcers can be extremely uncomfortable and may make it difficult to eat, drink, and brush your teeth.  You may have more than 1 ulcer and they can vary and change in size. Mouth ulcers are not contagious and should not be confused with cold sores.  Cold sores appear on the lip or around the outside of the mouth and often begin with a tingling, burning or itching sensation.   While the exact causes are unknown, some common causes and factors that may aggravate mouth ulcers include: . Genetics - Sometimes mouth ulcers run in families . High alcohol intake . Acidic foods such as citrus fruits like pineapple, grapefruit, orange fruits/juices, may aggravate mouth ulcers . Other foods high in acidity or spice such as coffee, chocolate, chips, pretzels, eggs, nuts, cheese . Quitting smoking . Injury caused by biting the tongue or inside of the cheek . Diet lacking in Q-22, zinc, folic acid or iron . Female hormone shifts with menstruation . Excessive fatigue, emotional stress or anxiety Prevention: . Talk to your doctor if you are taking meds that are known to cause mouth ulcers such as:   Anti-inflammatory drugs (for example Ibuprofen, Naproxen sodium), pain killers, Beta blockers, Oral nicotine replacement drugs, Some street drugs (heroin).   . Avoid allowing any tablets to dissolve in your mouth that are meant to swallowed whole . Avoid foods/drinks that trigger or worsen symptoms . Keep your mouth clean with daily  brushing and flossing  Home Care: . The goal with treatment is to ease the pain where ulcers occur and help them heal as quickly as possible.  There is no medical treatment to prevent mouth ulcers from coming back or recurring.  . Avoid spicy and acidic foods . Eat soft foods and avoid rough, crunchy foods . Avoid chewing gum . Do not use toothpaste that contains sodium lauryl sulphite . Use a straw to drink which helps avoid liquids toughing the ulcers near the front of your mouth . Use a very soft toothbrush . If you have dentures or dental hardware that you feel is not fitting well or contributing to his, please see your dentist. . Use saltwater mouthwash which helps healing. Dissolve a  teaspoon of salt in a glass of warm water. Swish around your mouth and spit it out. This can be used as needed if it is soothing.   GET HELP RIGHT AWAY IF: . Persistent ulcers require checking IN PERSON (face to face). Any mouth lesion lasting longer than a month should be seen by your DENTIST as soon as possible for evaluation for possible oral cancer. . If you have a non-painful ulcer in 1 or more areas of your mouth . Ulcers that are spreading, are very large or particularly painful . Ulcers last longer than one week without improving on treatment . If you develop a fever, swollen glands and begin to feel unwell . Ulcers that developed after starting a new  medication MAKE SURE YOU:  Understand these instructions.  Will watch your condition.  Will get help right away if you are not doing well or get worse.  Your e-visit answers were reviewed by a board certified advanced clinical practitioner to complete your personal care plan.  Depending upon the condition, your plan could have included both over the counter or prescription medications.    Please review your pharmacy choice.  Be sure that the pharmacy you have chosen is open so that you can pick up your prescription now.  If there is a problem,  you can message your provider in [place] to have the prescription routed to another pharmacy.    Your safety is important to [place].  If you have drug allergies check our prescription carefully.  For the next 24 hours you can use MyChart to ask questions about today's visit, request a non-urgent call back, or ask for a work or school excuse from your e-visit provider.  You will get an email with a survey asking about your experience and to give [place] any feedback.  I hope that your e-visit has been valuable and will speed your recovery.  Approximately 5 minutes was spent documenting and reviewing patient's chart.

## 2020-04-16 ENCOUNTER — Other Ambulatory Visit (HOSPITAL_COMMUNITY): Payer: Self-pay | Admitting: Adult Health

## 2020-04-17 NOTE — Telephone Encounter (Signed)
Rx already sent in. Please deny to remove this from the basket.

## 2020-04-19 ENCOUNTER — Other Ambulatory Visit (HOSPITAL_BASED_OUTPATIENT_CLINIC_OR_DEPARTMENT_OTHER): Payer: Self-pay

## 2020-04-19 DIAGNOSIS — N3946 Mixed incontinence: Secondary | ICD-10-CM | POA: Diagnosis not present

## 2020-04-19 DIAGNOSIS — R350 Frequency of micturition: Secondary | ICD-10-CM | POA: Diagnosis not present

## 2020-04-19 DIAGNOSIS — N8111 Cystocele, midline: Secondary | ICD-10-CM | POA: Diagnosis not present

## 2020-04-25 DIAGNOSIS — Z124 Encounter for screening for malignant neoplasm of cervix: Secondary | ICD-10-CM | POA: Diagnosis not present

## 2020-04-25 DIAGNOSIS — N814 Uterovaginal prolapse, unspecified: Secondary | ICD-10-CM | POA: Diagnosis not present

## 2020-04-25 DIAGNOSIS — R159 Full incontinence of feces: Secondary | ICD-10-CM | POA: Diagnosis not present

## 2020-04-25 DIAGNOSIS — R300 Dysuria: Secondary | ICD-10-CM | POA: Diagnosis not present

## 2020-04-29 ENCOUNTER — Other Ambulatory Visit (HOSPITAL_COMMUNITY): Payer: Self-pay

## 2020-04-29 MED ORDER — AMOXICILLIN-POT CLAVULANATE 875-125 MG PO TABS
875.0000 mg | ORAL_TABLET | Freq: Two times a day (BID) | ORAL | 0 refills | Status: DC
  Filled 2020-04-29: qty 14, 7d supply, fill #0

## 2020-04-30 ENCOUNTER — Other Ambulatory Visit (HOSPITAL_COMMUNITY): Payer: Self-pay

## 2020-04-30 DIAGNOSIS — N3946 Mixed incontinence: Secondary | ICD-10-CM | POA: Diagnosis not present

## 2020-04-30 DIAGNOSIS — N8111 Cystocele, midline: Secondary | ICD-10-CM | POA: Diagnosis not present

## 2020-05-01 ENCOUNTER — Ambulatory Visit: Payer: 59 | Admitting: Family Medicine

## 2020-05-01 ENCOUNTER — Other Ambulatory Visit: Payer: Self-pay | Admitting: Family Medicine

## 2020-05-01 ENCOUNTER — Other Ambulatory Visit: Payer: Self-pay

## 2020-05-01 ENCOUNTER — Other Ambulatory Visit (HOSPITAL_BASED_OUTPATIENT_CLINIC_OR_DEPARTMENT_OTHER): Payer: Self-pay

## 2020-05-01 ENCOUNTER — Encounter: Payer: Self-pay | Admitting: Family Medicine

## 2020-05-01 ENCOUNTER — Other Ambulatory Visit (HOSPITAL_COMMUNITY): Payer: Self-pay

## 2020-05-01 VITALS — BP 130/80 | HR 64 | Temp 97.9°F | Wt 110.0 lb

## 2020-05-01 DIAGNOSIS — M2550 Pain in unspecified joint: Secondary | ICD-10-CM | POA: Diagnosis not present

## 2020-05-01 DIAGNOSIS — F1190 Opioid use, unspecified, uncomplicated: Secondary | ICD-10-CM | POA: Diagnosis not present

## 2020-05-01 MED ORDER — OXYCODONE HCL 10 MG PO TABS: 10.0000 mg | ORAL_TABLET | Freq: Four times a day (QID) | ORAL | 0 refills | Status: DC | PRN

## 2020-05-01 MED ORDER — OXYCODONE HCL 10 MG PO TABS
10.0000 mg | ORAL_TABLET | Freq: Four times a day (QID) | ORAL | 0 refills | Status: DC | PRN
  Filled 2020-05-01: qty 120, 30d supply, fill #0

## 2020-05-01 NOTE — Progress Notes (Signed)
   Subjective:    Patient ID: [name], female    DOB: Feb 09, 1955, 65 y.o.   MRN: [id number]  HPI Here for pain management, she is doing well as far as arthritis goes. She has developed significant uterine prolapse which is causing a bladder outlet obstruction. She will be having a hysterectomy sometime in the next few months.  Indication for chronic opioid: OA Medication and dose: Oxycodone 10 mg  # pills per month: 120 Last UDS date: 02-10-19 Opioid Treatment Agreement signed (Y/N): 04-30-17 Opioid Treatment Agreement last reviewed with patient:  05-01-20 NCCSRS reviewed this encounter (include red flags): Yes   Review of Systems     Objective:   Physical Exam        Assessment & Plan:  Pain management, meds were refilled.  [name], MD

## 2020-05-02 ENCOUNTER — Other Ambulatory Visit (HOSPITAL_COMMUNITY): Payer: Self-pay

## 2020-05-02 MED ORDER — PREGABALIN 300 MG PO CAPS
ORAL_CAPSULE | Freq: Two times a day (BID) | ORAL | 5 refills | Status: DC
  Filled 2020-05-02: qty 60, 30d supply, fill #0
  Filled 2020-06-01: qty 6, 3d supply, fill #1
  Filled 2020-06-03: qty 54, 27d supply, fill #1
  Filled 2020-07-08: qty 60, 30d supply, fill #2
  Filled 2020-08-06: qty 60, 30d supply, fill #3
  Filled 2020-09-05: qty 60, 30d supply, fill #4
  Filled 2020-10-05: qty 60, 30d supply, fill #5

## 2020-05-02 NOTE — Telephone Encounter (Signed)
Last refill- 11/01/2019-60 tabs-5 refills Last office visit- 05/01/20

## 2020-05-03 ENCOUNTER — Encounter: Payer: Self-pay | Admitting: Family Medicine

## 2020-05-03 ENCOUNTER — Other Ambulatory Visit (HOSPITAL_COMMUNITY): Payer: Self-pay

## 2020-05-03 ENCOUNTER — Ambulatory Visit: Payer: 59 | Admitting: Family Medicine

## 2020-05-03 ENCOUNTER — Telehealth: Payer: Self-pay | Admitting: Family Medicine

## 2020-05-03 MED ORDER — OXYCODONE HCL 10 MG PO TABS
10.0000 mg | ORAL_TABLET | Freq: Four times a day (QID) | ORAL | 0 refills | Status: DC | PRN
  Filled 2020-05-03: qty 120, 30d supply, fill #0

## 2020-05-03 MED ORDER — OXYCODONE HCL 10 MG PO TABS: 10.0000 mg | ORAL_TABLET | Freq: Four times a day (QID) | ORAL | 0 refills | Status: DC | PRN

## 2020-05-03 NOTE — Telephone Encounter (Signed)
Done

## 2020-05-03 NOTE — Telephone Encounter (Signed)
pt would like to know if Dr. Sarajane Jews could resend her RX for  Oxycodone HCl 10 MG TABS. there was a glitch  in the system yesterday, and it canceled her [facility]  Phone:  [phone] Fax:  [phone]

## 2020-05-03 NOTE — Telephone Encounter (Signed)
I resent them.

## 2020-05-03 NOTE — Telephone Encounter (Signed)
Patient informed via Mychart message

## 2020-05-04 ENCOUNTER — Other Ambulatory Visit (HOSPITAL_COMMUNITY): Payer: Self-pay

## 2020-05-06 ENCOUNTER — Other Ambulatory Visit (HOSPITAL_COMMUNITY): Payer: Self-pay

## 2020-05-06 MED ORDER — OXYCODONE HCL 10 MG PO TABS
10.0000 mg | ORAL_TABLET | Freq: Four times a day (QID) | ORAL | 0 refills | Status: DC | PRN
  Filled 2020-05-06 – 2020-05-07 (×3): qty 120, 30d supply, fill #0

## 2020-05-06 NOTE — Telephone Encounter (Signed)
I just sent in another refill for Oxycodone dated today. Please call the pharmacy to see if they got it

## 2020-05-06 NOTE — Telephone Encounter (Signed)
Patient called back and stated that she still can't pick the medication up because they date that they have to fill says 07/03/2020, please advise. CB is [phone]

## 2020-05-07 ENCOUNTER — Encounter: Payer: Self-pay | Admitting: Family Medicine

## 2020-05-07 ENCOUNTER — Other Ambulatory Visit: Payer: Self-pay | Admitting: Family Medicine

## 2020-05-07 ENCOUNTER — Other Ambulatory Visit (HOSPITAL_COMMUNITY): Payer: Self-pay

## 2020-05-07 NOTE — Telephone Encounter (Signed)
See my other reply dated today

## 2020-05-07 NOTE — Telephone Encounter (Signed)
This was taken care of

## 2020-05-07 NOTE — Telephone Encounter (Signed)
Message from pharmacy saying, "Patient needs new prescription for this month, other prescription for June".  Please advise

## 2020-05-08 ENCOUNTER — Other Ambulatory Visit (HOSPITAL_COMMUNITY): Payer: Self-pay

## 2020-05-08 ENCOUNTER — Telehealth: Payer: 59 | Admitting: Family Medicine

## 2020-05-20 ENCOUNTER — Other Ambulatory Visit: Payer: Self-pay | Admitting: Urology

## 2020-05-26 DIAGNOSIS — N3000 Acute cystitis without hematuria: Secondary | ICD-10-CM | POA: Diagnosis not present

## 2020-05-30 ENCOUNTER — Other Ambulatory Visit (HOSPITAL_COMMUNITY): Payer: Self-pay

## 2020-05-30 DIAGNOSIS — R8271 Bacteriuria: Secondary | ICD-10-CM | POA: Diagnosis not present

## 2020-05-30 DIAGNOSIS — N8111 Cystocele, midline: Secondary | ICD-10-CM | POA: Diagnosis not present

## 2020-05-30 DIAGNOSIS — N3946 Mixed incontinence: Secondary | ICD-10-CM | POA: Diagnosis not present

## 2020-05-30 MED ORDER — CIPROFLOXACIN HCL 250 MG PO TABS
ORAL_TABLET | ORAL | 0 refills | Status: DC
  Filled 2020-05-30: qty 14, 7d supply, fill #0

## 2020-05-30 MED ORDER — TRIMETHOPRIM 100 MG PO TABS
ORAL_TABLET | ORAL | 11 refills | Status: DC
  Filled 2020-05-30: qty 30, 30d supply, fill #0

## 2020-05-30 MED FILL — Amphetamine-Dextroamphetamine Cap ER 24HR 20 MG: ORAL | 30 days supply | Qty: 30 | Fill #0 | Status: AC

## 2020-06-01 ENCOUNTER — Other Ambulatory Visit (HOSPITAL_COMMUNITY): Payer: Self-pay

## 2020-06-02 MED FILL — Celecoxib Cap 200 MG: ORAL | 90 days supply | Qty: 180 | Fill #0 | Status: AC

## 2020-06-02 MED FILL — Valacyclovir HCl Tab 1 GM: ORAL | 90 days supply | Qty: 90 | Fill #0 | Status: AC

## 2020-06-03 ENCOUNTER — Other Ambulatory Visit: Payer: 59

## 2020-06-03 ENCOUNTER — Other Ambulatory Visit: Payer: Self-pay

## 2020-06-03 ENCOUNTER — Other Ambulatory Visit: Payer: Self-pay | Admitting: Family Medicine

## 2020-06-03 ENCOUNTER — Other Ambulatory Visit (HOSPITAL_COMMUNITY): Payer: Self-pay

## 2020-06-03 DIAGNOSIS — F1190 Opioid use, unspecified, uncomplicated: Secondary | ICD-10-CM | POA: Diagnosis not present

## 2020-06-03 DIAGNOSIS — R350 Frequency of micturition: Secondary | ICD-10-CM | POA: Diagnosis not present

## 2020-06-03 DIAGNOSIS — N8111 Cystocele, midline: Secondary | ICD-10-CM | POA: Diagnosis not present

## 2020-06-03 MED ORDER — OXYCODONE HCL 10 MG PO TABS: 10.0000 mg | ORAL_TABLET | Freq: Four times a day (QID) | ORAL | 0 refills | Status: DC | PRN

## 2020-06-03 NOTE — Telephone Encounter (Signed)
I sent in refills for 2 more months

## 2020-06-03 NOTE — Telephone Encounter (Signed)
Last office visit- 05/01/20 Last refill- 05/06/20-120 tabs no refills  No future office visit scheduled

## 2020-06-04 ENCOUNTER — Other Ambulatory Visit (HOSPITAL_COMMUNITY): Payer: Self-pay

## 2020-06-04 ENCOUNTER — Other Ambulatory Visit: Payer: Self-pay | Admitting: Urology

## 2020-06-05 ENCOUNTER — Encounter: Payer: Self-pay | Admitting: Family Medicine

## 2020-06-05 ENCOUNTER — Telehealth (INDEPENDENT_AMBULATORY_CARE_PROVIDER_SITE_OTHER): Payer: 59 | Admitting: Family Medicine

## 2020-06-05 ENCOUNTER — Other Ambulatory Visit (HOSPITAL_COMMUNITY): Payer: Self-pay

## 2020-06-05 DIAGNOSIS — M2550 Pain in unspecified joint: Secondary | ICD-10-CM | POA: Diagnosis not present

## 2020-06-05 MED ORDER — OXYCODONE HCL 10 MG PO TABS: 10.0000 mg | ORAL_TABLET | Freq: Four times a day (QID) | ORAL | 0 refills | Status: DC | PRN

## 2020-06-05 NOTE — Progress Notes (Signed)
Subjective:    Patient ID: [name], female    DOB: 12/30/1955, 65 y.o.   MRN: [id number]  HPI Virtual Visit via Video Note  I connected with the patient on 06/05/20 at  3:30 PM EDT by a video enabled telemedicine application and verified that I am speaking with the correct person using two identifiers.  Location patient: home Location provider:work or home office Persons participating in the virtual visit: patient, provider  I discussed the limitations of evaluation and management by telemedicine and the availability of in person appointments. The patient expressed understanding and agreed to proceed.   HPI: Here to discuss problems she is having getting her pain medication filled. She has been using [facility] for her medications, but since they updated their computer system last month there have been many issues with lost prescriptions on controlled medications. We have sent in refills for her Oxycodone several times, and each time the pharmacy says they only received one of the refills (when in reality we have sent 2 or 3 refills). She is now out and they do not a refill available for her.    ROS: See pertinent positives and negatives per HPI.  Past Medical History:  Diagnosis Date  . ARDS (adult respiratory distress syndrome) ([place]) 2007  . Arthritis   . Depression   . Gallstones 01/07/2018  . Ganglion cyst of dorsum of right wrist   . Headache   . MRSA (methicillin resistant Staphylococcus aureus) 2007  . Osteoporosis   . Pneumonia 2009  . Prosthetic eye globe    right eye   . Raynaud's disease     Past Surgical History:  Procedure Laterality Date  . ANTERIOR CERVICAL DECOMP/DISCECTOMY FUSION N/A 12/12/2014   Procedure: ANTERIOR CERVICAL DECOMPRESSION/DISCECTOMY FUSION 3 LEVELS;  Surgeon: Phylliss Bob, MD;  Location: [place];  Service: Orthopedics;  Laterality: N/A;  Anterior cervical decompression fusion, cerivcal 5-6, cervical 6-7,  cervical 7-thoracic 1 with instrumentation and allografrt  . APPENDECTOMY  1974  . CATARACT EXTRACTION Left 11/2018  . CHOLECYSTECTOMY N/A 01/07/2018   Procedure: LAPAROSCOPIC CHOLECYSTECTOMY WITH INTRAOPERATIVE CHOLANGIOGRAM;  Surgeon: Fanny Skates, MD;  Location: [place];  Service: General;  Laterality: N/A;  . COLONOSCOPY  04/14/2013   per Dr. Ardis Hughs, clear, repeat in 10 yrs   . CORNEAL TRANSPLANT Right 2007   x4  . ENUCLEATION Right 2013   eye infection  . GANGLION CYST EXCISION Right 05/25/2016   Procedure: RIGHT WRIST ULNAR GANGLION EXCISION;  Surgeon: Milly Jakob, MD;  Location: [place];  Service: Orthopedics;  Laterality: Right;  . RHINOPLASTY  1980  . TONSILLECTOMY     removed in 3rd grade  . TOTAL SHOULDER ARTHROPLASTY Left 03/13/2019   Procedure: TOTAL SHOULDER ARTHROPLASTY;  Surgeon: Tania Ade, MD;  Location: [facility];  Service: Orthopedics;  Laterality: Left;    Family History  Problem Relation Age of Onset  . Breast cancer Maternal Aunt   . Colon cancer Neg Hx      Current Outpatient Medications:  .  albuterol (VENTOLIN HFA) 108 (90 Base) MCG/ACT inhaler, Inhale 2 puffs into the lungs every 4 (four) hours as needed for wheezing or shortness of breath., Disp: 18 g, Rfl: 0 .  amoxicillin-clavulanate (AUGMENTIN) 875-125 MG tablet, Take 1 tablet by mouth 2 (two) times daily., Disp: 14 tablet, Rfl: 0 .  amphetamine-dextroamphetamine (ADDERALL XR) 20 MG 24 hr capsule, Take 1 capsule (20 mg total) by mouth daily., Disp: 30  capsule, Rfl: 0 .  amphetamine-dextroamphetamine (ADDERALL XR) 20 MG 24 hr capsule, TAKE 1 CAPSULE BY MOUTH ONCE A DAY (FILL 05/04/20), Disp: 30 capsule, Rfl: 0 .  amphetamine-dextroamphetamine (ADDERALL XR) 20 MG 24 hr capsule, TAKE 1 CAPSULE (20 MG TOTAL) BY MOUTH DAILY., Disp: 30 capsule, Rfl: 0 .  amphetamine-dextroamphetamine (ADDERALL XR) 20 MG 24 hr capsule, TAKE 1 CAPSULE BY MOUTH ONCE A DAY (FILL 05/04/20),  Disp: 30 capsule, Rfl: 0 .  amphetamine-dextroamphetamine (ADDERALL XR) 20 MG 24 hr capsule, TAKE 1 CAPSULE BY MOUTH ONCE A DAY (02/03/20), Disp: 30 capsule, Rfl: 0 .  amphetamine-dextroamphetamine (ADDERALL XR) 20 MG 24 hr capsule, TAKE 1 CAPSULE BY MOUTH DAILY., Disp: 30 capsule, Rfl: 0 .  B Complex-C (SUPER B COMPLEX PO), Take 1 tablet by mouth daily., Disp: , Rfl:  .  Calcium Carbonate-Vitamin D 600-400 MG-UNIT tablet, Take 1 tablet by mouth daily. , Disp: , Rfl:  .  Carboxymethylcellul-Glycerin (REFRESH OPTIVE PF OP), Apply 1 drop to eye daily as needed (dry eyes)., Disp: , Rfl:  .  celecoxib (CELEBREX) 200 MG capsule, TAKE 1 CAPSULE BY MOUTH TWICE DAILY, Disp: 180 capsule, Rfl: 3 .  cetirizine (ZYRTEC) 10 MG tablet, Take by mouth., Disp: , Rfl:  .  ciprofloxacin (CIPRO) 250 MG tablet, Take 1 tablet by mouth twice daily, Disp: 14 tablet, Rfl: 0 .  Diclofenac Sodium 2 % SOLN, Apply 1 application topically 4 (four) times daily., Disp: 112 g, Rfl: 11 .  mupirocin ointment (BACTROBAN) 2 %, Apply 1 application topically 2 (two) times daily., Disp: 22 g, Rfl: 1 .  nystatin (MYCOSTATIN) 100000 UNIT/ML suspension, Take 5 mLs (500,000 Units total) by mouth 4 (four) times daily., Disp: 473 mL, Rfl: 1 .  nystatin (MYCOSTATIN) 100000 UNIT/ML suspension, SWISH AND SWALLOW 5 MLS BY MOUTH 4 TIMES DAILY 1 HOUR PRIOR TO MEALS, Disp: 480 mL, Rfl: 0 .  nystatin cream (MYCOSTATIN), Apply to affected area 2 times daily, Disp: 30 g, Rfl: 0 .  [START ON 07/06/2020] Oxycodone HCl 10 MG TABS, Take 1 tablet (10 mg total) by mouth every 6 (six) hours as needed (pain)., Disp: 120 tablet, Rfl: 0 .  polyethylene glycol powder (GLYCOLAX/MIRALAX) powder, TAKE 17 GRAMS BY MOUTH 2 TIMES DAILY AS NEEDED. (Patient taking differently: Take 0.5 Containers by mouth daily as needed for moderate constipation.), Disp: 3162 g, Rfl: 1 .  pregabalin (LYRICA) 300 MG capsule, TAKE 1 CAPSULE BY MOUTH TWICE DAILY, Disp: 60 capsule, Rfl: 5 .   sulfamethoxazole-trimethoprim (BACTRIM DS) 800-160 MG tablet, TAKE 1 TABLET BY MOUTH 2 TIMES DAILY, Disp: 14 tablet, Rfl: 0 .  tobramycin (TOBREX) 0.3 % ophthalmic solution, Place 2 drops into the right eye every 4 (four) hours., Disp: 5 mL, Rfl: 1 .  trimethoprim (TRIMPEX) 100 MG tablet, Take 1 tablet by mouth once daily, Disp: 30 tablet, Rfl: 11 .  valACYclovir (VALTREX) 1000 MG tablet, Take 1,000 mg by mouth daily., Disp: , Rfl:  .  valACYclovir (VALTREX) 1000 MG tablet, TAKE 1 TABLET BY MOUTH DAILY., Disp: 90 tablet, Rfl: 11 .  verapamil (CALAN-SR) 120 MG CR tablet, TAKE 1 TABLET BY MOUTH AT BEDTIME, Disp: 90 tablet, Rfl: 3  EXAM:  VITALS per patient if applicable:  GENERAL: alert, oriented, appears well and in no acute distress  HEENT: atraumatic, conjunttiva clear, no obvious abnormalities on inspection of external nose and ears  NECK: normal movements of the head and neck  LUNGS: on inspection no signs of respiratory distress, breathing rate  appears normal, no obvious gross SOB, gasping or wheezing  CV: no obvious cyanosis  MS: moves all visible extremities without noticeable abnormality  PSYCH/NEURO: pleasant and cooperative, no obvious depression or anxiety, speech and thought processing grossly intact  ASSESSMENT AND PLAN: We agreed that she will begin using a new pharmacy for her controlled medication refills. I cancelled the remaining refill at [facility], and I sent in refills for the Oxycodone dated 06-05-20 and also 07-06-20 to the [facility] pharmacy. She will let us know if there are any more problems.  [name], MD  Discussed the following assessment and plan:  No diagnosis found.     I discussed the assessment and treatment plan with the patient. The patient was provided an opportunity to ask questions and all were answered. The patient agreed with the plan and demonstrated an understanding of the instructions.   The patient was advised to call back or  seek an in-person evaluation if the symptoms worsen or if the condition fails to improve as anticipated.     Review of Systems     Objective:   Physical Exam        Assessment & Plan:

## 2020-06-05 NOTE — Telephone Encounter (Signed)
Please call [facility] about this. I sent in two refills for the Oxycodone, one dated 06-05-20 and one dated 07-06-20. If this keeps happening, I will refuse to send any more controlled prescriptions to that pharmacy. Ever since they updated their system, it has NOT been working

## 2020-06-05 NOTE — Telephone Encounter (Signed)
Spoke with pt pharmacy state that they never received a new Rx for Oxycodone dated May 2022, state that they only have one that is dated for 07/06/2020. To refill Rx they are requesting for a new one date to fill on May 11,2022. Please advise

## 2020-06-05 NOTE — Telephone Encounter (Signed)
Noted  

## 2020-06-05 NOTE — Telephone Encounter (Signed)
We discussed this at her OV today  

## 2020-06-06 ENCOUNTER — Other Ambulatory Visit (HOSPITAL_COMMUNITY): Payer: Self-pay

## 2020-06-06 DIAGNOSIS — N8111 Cystocele, midline: Secondary | ICD-10-CM | POA: Diagnosis not present

## 2020-06-06 LAB — DRUG MONITOR, PANEL 1, W/CONF, URINE
Amphetamine: 1521 ng/mL — ABNORMAL HIGH (ref <250)
Amphetamines: POSITIVE ng/mL — AB (ref <500)
Barbiturates: NEGATIVE ng/mL (ref <300)
Benzodiazepines: NEGATIVE ng/mL (ref <100)
Cocaine Metabolite: NEGATIVE ng/mL (ref <150)
Codeine: NEGATIVE ng/mL (ref <50)
Creatinine: 14.1 mg/dL
Hydrocodone: NEGATIVE ng/mL (ref <50)
Hydromorphone: NEGATIVE ng/mL (ref <50)
Marijuana Metabolite: NEGATIVE ng/mL (ref <20)
Methadone Metabolite: NEGATIVE ng/mL (ref <100)
Methamphetamine: NEGATIVE ng/mL (ref <250)
Morphine: NEGATIVE ng/mL (ref <50)
Norhydrocodone: NEGATIVE ng/mL (ref <50)
Noroxycodone: 371 ng/mL — ABNORMAL HIGH (ref <50)
Opiates: NEGATIVE ng/mL (ref <100)
Oxidant: NEGATIVE ug/mL
Oxycodone: 183 ng/mL — ABNORMAL HIGH (ref <50)
Oxycodone: POSITIVE ng/mL — AB (ref <100)
Oxymorphone: 567 ng/mL — ABNORMAL HIGH (ref <50)
Phencyclidine: NEGATIVE ng/mL (ref <25)
Specific Gravity: 1.004 (ref >=1.003)
pH: 6.7 (ref 4.5–9.0)

## 2020-06-06 LAB — DM TEMPLATE

## 2020-06-06 MED ORDER — CIPROFLOXACIN HCL 250 MG PO TABS
ORAL_TABLET | ORAL | 0 refills | Status: DC
  Filled 2020-06-06: qty 14, 7d supply, fill #0

## 2020-06-06 MED ORDER — NYSTATIN 100000 UNIT/GM EX CREA
TOPICAL_CREAM | CUTANEOUS | 0 refills | Status: DC
  Filled 2020-06-06: qty 45, 23d supply, fill #0

## 2020-06-07 NOTE — Progress Notes (Signed)
Pt. Needs orders for upcomming surgery.PAT and labs on 06/11/20.

## 2020-06-07 NOTE — Patient Instructions (Signed)
DUE TO COVID-19 ONLY ONE VISITOR IS ALLOWED TO COME WITH YOU AND STAY IN THE WAITING ROOM ONLY DURING PRE OP AND PROCEDURE DAY OF SURGERY. THE 1 VISITOR  MAY VISIT WITH YOU AFTER SURGERY IN YOUR PRIVATE ROOM DURING VISITING HOURS ONLY!  YOU NEED TO HAVE A COVID 19 TEST ON: 06/11/20 @ 2:50 PM, THIS TEST MUST BE DONE BEFORE SURGERY,  COVID TESTING SITE [place] [address], IT IS ON THE RIGHT GOING OUT [address] APPROXIMATELY  2 MINUTES PAST [employer] ON THE RIGHT. ONCE YOUR COVID TEST IS COMPLETED,  PLEASE BEGIN THE QUARANTINE INSTRUCTIONS AS OUTLINED IN YOUR HANDOUT.                Srija Southard Streeter   Your procedure is scheduled on: 06/13/20    Report to [facility] Main  Entrance   Report to short stay at: 5:15 AM     Call this number if you have problems the morning of surgery [phone]    Remember: Do not eat food or drink liquids :After Midnight.   BRUSH YOUR TEETH MORNING OF SURGERY AND RINSE YOUR MOUTH OUT, NO CHEWING GUM CANDY OR MINTS.    Take these medicines the morning of surgery with A SIP OF WATER: pregabalin,cetirizine,antibiotics.Use inhalers and eye drops as usual.                               You may not have any metal on your body including hair pins and              piercings  Do not wear jewelry, make-up, lotions, powders or perfumes, deodorant             Do not wear nail polish on your fingernails.  Do not shave  48 hours prior to surgery. .   Do not bring valuables to the hospital. [place].  Contacts, dentures or bridgework may not be worn into surgery.  Leave suitcase in the car. After surgery it may be brought to your room.     Patients discharged the day of surgery will not be allowed to drive home. IF YOU ARE HAVING SURGERY AND GOING HOME THE SAME DAY, YOU MUST HAVE AN ADULT TO DRIVE YOU HOME AND BE WITH YOU FOR 24 HOURS. YOU MAY GO HOME BY TAXI OR UBER  OR ORTHERWISE, BUT AN ADULT MUST ACCOMPANY YOU HOME AND STAY WITH YOU FOR 24 HOURS.  Name and phone number of your driver:  Special Instructions: N/A              Please read over the following fact sheets you were given: _____________________________________________________________________          [facility] - Preparing for Surgery Before surgery, you can play an important role.  Because skin is not sterile, your skin needs to be as free of germs as possible.  You can reduce the number of germs on your skin by washing with CHG (chlorahexidine gluconate) soap before surgery.  CHG is an antiseptic cleaner which kills germs and bonds with the skin to continue killing germs even after washing. Please DO NOT use if you have an allergy to CHG or antibacterial soaps.  If your skin becomes reddened/irritated stop using the CHG and inform your nurse when  you arrive at Short Stay. Do not shave (including legs and underarms) for at least 48 hours prior to the first CHG shower.  You may shave your face/neck. Please follow these instructions carefully:  1.  Shower with CHG Soap the night before surgery and the  morning of Surgery.  2.  If you choose to wash your hair, wash your hair first as usual with your  normal  shampoo.  3.  After you shampoo, rinse your hair and body thoroughly to remove the  shampoo.                           4.  Use CHG as you would any other liquid soap.  You can apply chg directly  to the skin and wash                       Gently with a scrungie or clean washcloth.  5.  Apply the CHG Soap to your body ONLY FROM THE NECK DOWN.   Do not use on face/ open                           Wound or open sores. Avoid contact with eyes, ears mouth and genitals (private parts).                       Wash face,  Genitals (private parts) with your normal soap.             6.  Wash thoroughly, paying special attention to the area where your surgery  will be performed.  7.  Thoroughly rinse your  body with warm water from the neck down.  8.  DO NOT shower/wash with your normal soap after using and rinsing off  the CHG Soap.                9.  Pat yourself dry with a clean towel.            10.  Wear clean pajamas.            11.  Place clean sheets on your bed the night of your first shower and do not  sleep with pets. Day of Surgery : Do not apply any lotions/deodorants the morning of surgery.  Please wear clean clothes to the hospital/surgery center.  FAILURE TO FOLLOW THESE INSTRUCTIONS MAY RESULT IN THE CANCELLATION OF YOUR SURGERY PATIENT SIGNATURE_________________________________  NURSE SIGNATURE__________________________________  ________________________________________________________________________

## 2020-06-11 ENCOUNTER — Other Ambulatory Visit: Payer: Self-pay

## 2020-06-11 ENCOUNTER — Encounter (HOSPITAL_COMMUNITY)
Admission: RE | Admit: 2020-06-11 | Discharge: 2020-06-11 | Disposition: A | Discharge status: Home / Self Care | Payer: 59 | Source: Ambulatory Visit | Attending: Urology | Admitting: Urology

## 2020-06-11 ENCOUNTER — Other Ambulatory Visit (HOSPITAL_COMMUNITY)
Admission: RE | Admit: 2020-06-11 | Discharge: 2020-06-11 | Disposition: A | Discharge status: Home / Self Care | Payer: 59 | Source: Ambulatory Visit | Attending: Urology | Admitting: Urology

## 2020-06-11 ENCOUNTER — Encounter (HOSPITAL_COMMUNITY): Payer: Self-pay

## 2020-06-11 DIAGNOSIS — Z20822 Contact with and (suspected) exposure to covid-19: Secondary | ICD-10-CM | POA: Insufficient documentation

## 2020-06-11 DIAGNOSIS — Z01812 Encounter for preprocedural laboratory examination: Secondary | ICD-10-CM | POA: Insufficient documentation

## 2020-06-11 LAB — CBC
HCT: 39.2 % (ref 36.0–46.0)
Hemoglobin: 12.8 g/dL (ref 12.0–15.0)
MCH: 32.7 pg (ref 26.0–34.0)
MCHC: 32.7 g/dL (ref 30.0–36.0)
MCV: 100 fL (ref 80.0–100.0)
Platelets: 189 10*3/uL (ref 150–400)
RBC: 3.92 MIL/uL (ref 3.87–5.11)
RDW: 13.1 % (ref 11.5–15.5)
WBC: 3.9 10*3/uL — ABNORMAL LOW (ref 4.0–10.5)
nRBC: 0 % (ref 0.0–0.2)

## 2020-06-11 LAB — COMPREHENSIVE METABOLIC PANEL
ALT: 22 U/L (ref 0–44)
AST: 34 U/L (ref 15–41)
Albumin: 3.8 g/dL (ref 3.5–5.0)
Alkaline Phosphatase: 42 U/L (ref 38–126)
Anion gap: 6 (ref 5–15)
BUN: 17 mg/dL (ref 8–23)
CO2: 29 mmol/L (ref 22–32)
Calcium: 8.9 mg/dL (ref 8.9–10.3)
Chloride: 105 mmol/L (ref 98–111)
Creatinine, Ser: 0.76 mg/dL (ref 0.44–1.00)
GFR, Estimated: >60 mL/min (ref >60)
Glucose, Bld: 78 mg/dL (ref 70–99)
Potassium: 4.4 mmol/L (ref 3.5–5.1)
Sodium: 140 mmol/L (ref 135–145)
Total Bilirubin: 0.6 mg/dL (ref 0.3–1.2)
Total Protein: 7.4 g/dL (ref 6.5–8.1)

## 2020-06-11 LAB — PROTIME-INR
INR: 1 (ref 0.8–1.2)
Prothrombin Time: 13.3 seconds (ref 11.4–15.2)

## 2020-06-11 LAB — SURGICAL PCR SCREEN
MRSA, PCR: NEGATIVE
Staphylococcus aureus: POSITIVE — AB

## 2020-06-11 LAB — SARS CORONAVIRUS 2 (TAT 6-24 HRS): SARS Coronavirus 2: NEGATIVE

## 2020-06-11 NOTE — Progress Notes (Signed)
COVID Vaccine Completed: Yes Date COVID Vaccine completed: 11/11/19. [place] COVID vaccine manufacturer: Pfizer     PCP - Dr. [name] Cardiologist -   Chest x-ray -  EKG -  Stress Test -  ECHO -  Cardiac Cath -  Pacemaker/ICD device last checked:  Sleep Study -  CPAP -   Fasting Blood Sugar -  Checks Blood Sugar _____ times a day  Blood Thinner Instructions: Aspirin Instructions: Last Dose:  Anesthesia review: Hx: ARDS  Patient denies shortness of breath, fever, cough and chest pain at PAT appointment   Patient verbalized understanding of instructions that were given to them at the PAT appointment. Patient was also instructed that they will need to review over the PAT instructions again at home before surgery.

## 2020-06-12 LAB — URINE CULTURE

## 2020-06-12 NOTE — Progress Notes (Signed)
PCR:  positive [facility] 

## 2020-06-12 NOTE — Anesthesia Preprocedure Evaluation (Addendum)
Anesthesia Evaluation  Patient identified by MRN, date of birth, ID band Patient awake    Reviewed: Allergy & Precautions, NPO status , Patient's Chart, lab work & pertinent test results  History of Anesthesia Complications Negative for: history of anesthetic complications  Airway Mallampati: II  TM Distance: >3 FB Neck ROM: Full    Dental  (+) Teeth Intact, Dental Advisory Given   Pulmonary neg pulmonary ROS,    Pulmonary exam normal        Cardiovascular negative cardio ROS Normal cardiovascular exam     Neuro/Psych Depression negative neurological ROS     GI/Hepatic negative GI ROS, (+) Hepatitis - (treated), C  Endo/Other  negative endocrine ROS  Renal/GU negative Renal ROS  Female GU complaint (pelvic organ prolapse)     Musculoskeletal  (+) Arthritis , narcotic dependentChronic pain, takes oxycodone 10 mg 1-2x/day   Abdominal   Peds  (+) ADHD Hematology negative hematology ROS (+)   Anesthesia Other Findings  Raynaud's  Reproductive/Obstetrics                           Anesthesia Physical Anesthesia Plan  ASA: II  Anesthesia Plan: General   Post-op Pain Management:    Induction: Intravenous  PONV Risk Score and Plan: 4 or greater and Ondansetron, Dexamethasone, Treatment may vary due to age or medical condition, Midazolam and Scopolamine patch - Pre-op  Airway Management Planned: Oral ETT  Additional Equipment: None  Intra-op Plan:   Post-operative Plan: Extubation in OR  Informed Consent: I have reviewed the patients History and Physical, chart, labs and discussed the procedure including the risks, benefits and alternatives for the proposed anesthesia with the patient or authorized representative who has indicated his/her understanding and acceptance.     Dental advisory given  Plan Discussed with:   Anesthesia Plan Comments:        Anesthesia Quick  Evaluation

## 2020-06-13 ENCOUNTER — Ambulatory Visit (HOSPITAL_COMMUNITY): Payer: 59 | Admitting: Physician Assistant

## 2020-06-13 ENCOUNTER — Encounter (HOSPITAL_COMMUNITY): Payer: Self-pay | Admitting: Urology

## 2020-06-13 ENCOUNTER — Ambulatory Visit (HOSPITAL_COMMUNITY): Payer: 59 | Admitting: Certified Registered"

## 2020-06-13 ENCOUNTER — Observation Stay (HOSPITAL_COMMUNITY)
Admission: RE | Admit: 2020-06-13 | Discharge: 2020-06-14 | Disposition: A | Discharge status: Home / Self Care | Payer: 59 | Attending: Urology | Admitting: Urology

## 2020-06-13 ENCOUNTER — Other Ambulatory Visit: Payer: Self-pay

## 2020-06-13 ENCOUNTER — Encounter (HOSPITAL_COMMUNITY)
Admission: RE | Disposition: A | Discharge status: Home / Self Care | Payer: Self-pay | Source: Home / Self Care | Attending: Urology

## 2020-06-13 DIAGNOSIS — N819 Female genital prolapse, unspecified: Secondary | ICD-10-CM | POA: Diagnosis present

## 2020-06-13 DIAGNOSIS — N8501 Benign endometrial hyperplasia: Principal | ICD-10-CM | POA: Insufficient documentation

## 2020-06-13 DIAGNOSIS — F32A Depression, unspecified: Secondary | ICD-10-CM | POA: Diagnosis not present

## 2020-06-13 DIAGNOSIS — M810 Age-related osteoporosis without current pathological fracture: Secondary | ICD-10-CM | POA: Diagnosis not present

## 2020-06-13 DIAGNOSIS — N813 Complete uterovaginal prolapse: Secondary | ICD-10-CM

## 2020-06-13 DIAGNOSIS — J80 Acute respiratory distress syndrome: Secondary | ICD-10-CM | POA: Diagnosis not present

## 2020-06-13 DIAGNOSIS — N8189 Other female genital prolapse: Secondary | ICD-10-CM | POA: Diagnosis not present

## 2020-06-13 HISTORY — PX: ROBOTIC ASSISTED LAPAROSCOPIC SACROCOLPOPEXY: SHX5388

## 2020-06-13 LAB — TYPE AND SCREEN
ABO/RH(D): A NEG
Antibody Screen: NEGATIVE

## 2020-06-13 SURGERY — SACROCOLPOPEXY, ROBOT-ASSISTED, LAPAROSCOPIC
Anesthesia: General | Laterality: Bilateral

## 2020-06-13 MED ORDER — ONDANSETRON HCL 4 MG/2ML IJ SOLN
INTRAMUSCULAR | Status: AC
  Filled 2020-06-13: qty 2

## 2020-06-13 MED ORDER — HYDRALAZINE HCL 10 MG PO TABS
10.0000 mg | ORAL_TABLET | Freq: Four times a day (QID) | ORAL | Status: DC | PRN
Start: 2020-06-13 — End: 2020-06-14

## 2020-06-13 MED ORDER — LIDOCAINE 2% (20 MG/ML) 5 ML SYRINGE
INTRAMUSCULAR | Status: AC
  Filled 2020-06-13: qty 5

## 2020-06-13 MED ORDER — ACETAMINOPHEN 10 MG/ML IV SOLN
1000.0000 mg | Freq: Four times a day (QID) | INTRAVENOUS | Status: AC
  Administered 2020-06-13 – 2020-06-14 (×4): 1000 mg via INTRAVENOUS
  Filled 2020-06-13 (×4): qty 100

## 2020-06-13 MED ORDER — SUGAMMADEX SODIUM 200 MG/2ML IV SOLN
INTRAVENOUS | Status: DC | PRN
  Administered 2020-06-13: 100 mg via INTRAVENOUS

## 2020-06-13 MED ORDER — KETAMINE HCL 10 MG/ML IJ SOLN
INTRAMUSCULAR | Status: AC
  Filled 2020-06-13: qty 1

## 2020-06-13 MED ORDER — BUPIVACAINE-EPINEPHRINE 0.5% -1:200000 IJ SOLN
INTRAMUSCULAR | Status: DC | PRN
  Administered 2020-06-13: 10 mL

## 2020-06-13 MED ORDER — TRAMADOL HCL 50 MG PO TABS: 50.0000 mg | ORAL_TABLET | Freq: Four times a day (QID) | ORAL | Status: DC | PRN

## 2020-06-13 MED ORDER — OXYCODONE HCL 5 MG PO TABS: 5.0000 mg | ORAL_TABLET | Freq: Once | ORAL | Status: DC | PRN

## 2020-06-13 MED ORDER — AMISULPRIDE (ANTIEMETIC) 5 MG/2ML IV SOLN: 10.0000 mg | Freq: Once | INTRAVENOUS | Status: DC | PRN

## 2020-06-13 MED ORDER — KETOROLAC TROMETHAMINE 30 MG/ML IJ SOLN
30.0000 mg | Freq: Four times a day (QID) | INTRAMUSCULAR | Status: DC
  Administered 2020-06-13 – 2020-06-14 (×4): 30 mg via INTRAVENOUS
  Filled 2020-06-13 (×4): qty 1

## 2020-06-13 MED ORDER — DEXAMETHASONE SODIUM PHOSPHATE 10 MG/ML IJ SOLN
INTRAMUSCULAR | Status: DC | PRN
  Administered 2020-06-13: 4 mg via INTRAVENOUS

## 2020-06-13 MED ORDER — PREGABALIN 100 MG PO CAPS
300.0000 mg | ORAL_CAPSULE | Freq: Two times a day (BID) | ORAL | Status: DC
  Administered 2020-06-13 – 2020-06-14 (×2): 300 mg via ORAL
  Filled 2020-06-13 (×2): qty 3

## 2020-06-13 MED ORDER — KETAMINE HCL 10 MG/ML IJ SOLN
INTRAMUSCULAR | Status: DC | PRN
  Administered 2020-06-13: 15 mg via INTRAVENOUS
  Administered 2020-06-13: 10 mg via INTRAVENOUS

## 2020-06-13 MED ORDER — CHLORHEXIDINE GLUCONATE 0.12 % MT SOLN
15.0000 mL | Freq: Once | OROMUCOSAL | Status: AC
  Administered 2020-06-13: 15 mL via OROMUCOSAL

## 2020-06-13 MED ORDER — ROCURONIUM BROMIDE 10 MG/ML (PF) SYRINGE
PREFILLED_SYRINGE | INTRAVENOUS | Status: AC
  Filled 2020-06-13: qty 10

## 2020-06-13 MED ORDER — ONDANSETRON HCL 4 MG/2ML IJ SOLN
4.0000 mg | Freq: Once | INTRAMUSCULAR | Status: DC | PRN
Start: 2020-06-13 — End: 2020-06-13

## 2020-06-13 MED ORDER — SCOPOLAMINE 1 MG/3DAYS TD PT72
MEDICATED_PATCH | TRANSDERMAL | Status: AC
  Filled 2020-06-13: qty 1

## 2020-06-13 MED ORDER — VERAPAMIL HCL ER 120 MG PO TBCR
120.0000 mg | EXTENDED_RELEASE_TABLET | Freq: Every day | ORAL | Status: DC
  Administered 2020-06-13: 120 mg via ORAL
  Filled 2020-06-13: qty 1

## 2020-06-13 MED ORDER — PROPOFOL 10 MG/ML IV BOLUS
INTRAVENOUS | Status: DC | PRN
  Administered 2020-06-13: 120 mg via INTRAVENOUS

## 2020-06-13 MED ORDER — CEFAZOLIN SODIUM-DEXTROSE 2-4 GM/100ML-% IV SOLN
2.0000 g | INTRAVENOUS | Status: AC
  Administered 2020-06-13: 2 g via INTRAVENOUS
  Filled 2020-06-13: qty 100

## 2020-06-13 MED ORDER — FLEET ENEMA 7-19 GM/118ML RE ENEM
1.0000 | ENEMA | Freq: Once | RECTAL | Status: AC
  Administered 2020-06-13: 1 via RECTAL
  Filled 2020-06-13: qty 1

## 2020-06-13 MED ORDER — CHLORHEXIDINE GLUCONATE CLOTH 2 % EX PADS
6.0000 | MEDICATED_PAD | Freq: Every day | CUTANEOUS | Status: DC
  Administered 2020-06-13: 6 via TOPICAL

## 2020-06-13 MED ORDER — SCOPOLAMINE 1 MG/3DAYS TD PT72
MEDICATED_PATCH | TRANSDERMAL | Status: DC | PRN
  Administered 2020-06-13: 1 via TRANSDERMAL

## 2020-06-13 MED ORDER — ORAL CARE MOUTH RINSE: 15.0000 mL | Freq: Once | OROMUCOSAL | Status: AC

## 2020-06-13 MED ORDER — LACTATED RINGERS IV SOLN: INTRAVENOUS | Status: DC

## 2020-06-13 MED ORDER — FENTANYL CITRATE (PF) 100 MCG/2ML IJ SOLN: 25.0000 ug | INTRAMUSCULAR | Status: DC | PRN

## 2020-06-13 MED ORDER — ESTRADIOL 0.1 MG/GM VA CREA
TOPICAL_CREAM | VAGINAL | Status: DC | PRN
  Administered 2020-06-13: 1 via VAGINAL

## 2020-06-13 MED ORDER — MIDAZOLAM HCL 2 MG/2ML IJ SOLN
INTRAMUSCULAR | Status: DC | PRN
  Administered 2020-06-13: 2 mg via INTRAVENOUS

## 2020-06-13 MED ORDER — EPHEDRINE SULFATE-NACL 50-0.9 MG/10ML-% IV SOSY
PREFILLED_SYRINGE | INTRAVENOUS | Status: DC | PRN
Start: 2020-06-13 — End: 2020-06-13
  Administered 2020-06-13: 10 mg via INTRAVENOUS

## 2020-06-13 MED ORDER — SODIUM CHLORIDE 0.45 % IV SOLN: INTRAVENOUS | Status: DC

## 2020-06-13 MED ORDER — VALACYCLOVIR HCL 500 MG PO TABS
1000.0000 mg | ORAL_TABLET | Freq: Every day | ORAL | Status: DC
  Administered 2020-06-14: 1000 mg via ORAL
  Filled 2020-06-13: qty 2

## 2020-06-13 MED ORDER — ESTRADIOL 0.1 MG/GM VA CREA
TOPICAL_CREAM | VAGINAL | Status: AC
  Filled 2020-06-13: qty 42.5

## 2020-06-13 MED ORDER — ONDANSETRON HCL 4 MG/2ML IJ SOLN
INTRAMUSCULAR | Status: DC | PRN
  Administered 2020-06-13: 4 mg via INTRAVENOUS

## 2020-06-13 MED ORDER — LACTATED RINGERS IR SOLN
Status: DC | PRN
  Administered 2020-06-13: 1000 mL

## 2020-06-13 MED ORDER — DEXAMETHASONE SODIUM PHOSPHATE 10 MG/ML IJ SOLN
INTRAMUSCULAR | Status: AC
  Filled 2020-06-13: qty 1

## 2020-06-13 MED ORDER — FENTANYL CITRATE (PF) 100 MCG/2ML IJ SOLN
INTRAMUSCULAR | Status: DC | PRN
  Administered 2020-06-13: 100 ug via INTRAVENOUS

## 2020-06-13 MED ORDER — PROPOFOL 10 MG/ML IV BOLUS
INTRAVENOUS | Status: AC
  Filled 2020-06-13: qty 20

## 2020-06-13 MED ORDER — FENTANYL CITRATE (PF) 100 MCG/2ML IJ SOLN
INTRAMUSCULAR | Status: AC
  Filled 2020-06-13: qty 2

## 2020-06-13 MED ORDER — CIPROFLOXACIN HCL 250 MG PO TABS
250.0000 mg | ORAL_TABLET | Freq: Two times a day (BID) | ORAL | Status: DC
  Administered 2020-06-13 – 2020-06-14 (×2): 250 mg via ORAL
  Filled 2020-06-13 (×2): qty 1

## 2020-06-13 MED ORDER — OXYCODONE HCL 5 MG/5ML PO SOLN: 5.0000 mg | Freq: Once | ORAL | Status: DC | PRN

## 2020-06-13 MED ORDER — ROCURONIUM BROMIDE 10 MG/ML (PF) SYRINGE
PREFILLED_SYRINGE | INTRAVENOUS | Status: DC | PRN
  Administered 2020-06-13 (×2): 20 mg via INTRAVENOUS
  Administered 2020-06-13: 50 mg via INTRAVENOUS
  Administered 2020-06-13: 20 mg via INTRAVENOUS

## 2020-06-13 MED ORDER — BUPIVACAINE-EPINEPHRINE 0.5% -1:200000 IJ SOLN
INTRAMUSCULAR | Status: AC
  Filled 2020-06-13: qty 1

## 2020-06-13 MED ORDER — ALBUTEROL SULFATE HFA 108 (90 BASE) MCG/ACT IN AERS
2.0000 | INHALATION_SPRAY | RESPIRATORY_TRACT | Status: DC | PRN
  Filled 2020-06-13: qty 6.7

## 2020-06-13 MED ORDER — BUPIVACAINE LIPOSOME 1.3 % IJ SUSP
20.0000 mL | Freq: Once | INTRAMUSCULAR | Status: AC
  Administered 2020-06-13: 20 mL
  Filled 2020-06-13: qty 20

## 2020-06-13 MED ORDER — MIDAZOLAM HCL 2 MG/2ML IJ SOLN
INTRAMUSCULAR | Status: AC
  Filled 2020-06-13: qty 2

## 2020-06-13 MED ORDER — LIDOCAINE 2% (20 MG/ML) 5 ML SYRINGE
INTRAMUSCULAR | Status: DC | PRN
  Administered 2020-06-13: 40 mg via INTRAVENOUS

## 2020-06-13 SURGICAL SUPPLY — 69 items
ADH SKN CLS APL DERMABOND .7 (GAUZE/BANDAGES/DRESSINGS) ×1
BAG DRN RND TRDRP ANRFLXCHMBR (UROLOGICAL SUPPLIES)
BAG SPEC RTRVL LRG 6X4 10 (ENDOMECHANICALS) ×1
BAG URINE DRAIN 2000ML AR STRL (UROLOGICAL SUPPLIES) IMPLANT
CATH FOLEY 2WAY SLVR  5CC 16FR (CATHETERS) ×2
CATH FOLEY 2WAY SLVR 5CC 16FR (CATHETERS) ×1 IMPLANT
CHLORAPREP W/TINT 26 (MISCELLANEOUS) ×2 IMPLANT
CLIP VESOLOCK LG 6/CT PURPLE (CLIP) ×2 IMPLANT
CLIP VESOLOCK MED LG 6/CT (CLIP) IMPLANT
CLIP VESOLOCK XL 6/CT (CLIP) IMPLANT
COVER SURGICAL LIGHT HANDLE (MISCELLANEOUS) ×2 IMPLANT
COVER TIP SHEARS 8 DVNC (MISCELLANEOUS) ×1 IMPLANT
COVER TIP SHEARS 8MM DA VINCI (MISCELLANEOUS) ×2
COVER WAND RF STERILE (DRAPES) IMPLANT
DERMABOND ADVANCED (GAUZE/BANDAGES/DRESSINGS) ×1
DERMABOND ADVANCED .7 DNX12 (GAUZE/BANDAGES/DRESSINGS) ×1 IMPLANT
DRAIN CHANNEL RND F F (WOUND CARE) IMPLANT
DRAPE ARM DVNC X/XI (DISPOSABLE) ×4 IMPLANT
DRAPE COLUMN DVNC XI (DISPOSABLE) ×1 IMPLANT
DRAPE DA VINCI XI ARM (DISPOSABLE) ×8
DRAPE DA VINCI XI COLUMN (DISPOSABLE) ×2
DRAPE INCISE IOBAN 66X45 STRL (DRAPES) ×2 IMPLANT
DRAPE SHEET LG 3/4 BI-LAMINATE (DRAPES) ×4 IMPLANT
DRAPE SURG IRRIG POUCH 19X23 (DRAPES) ×2 IMPLANT
DRSG TEGADERM 8X12 (GAUZE/BANDAGES/DRESSINGS) ×2 IMPLANT
ELECT PENCIL ROCKER SW 15FT (MISCELLANEOUS) ×2 IMPLANT
ELECT REM PT RETURN 15FT ADLT (MISCELLANEOUS) ×2 IMPLANT
GAUZE 4X4 16PLY RFD (DISPOSABLE) IMPLANT
GLOVE SURG ENC MOIS LTX SZ6.5 (GLOVE) ×2 IMPLANT
GLOVE SURG ENC TEXT LTX SZ7.5 (GLOVE) ×6 IMPLANT
GOWN STRL REUS W/TWL LRG LVL3 (GOWN DISPOSABLE) ×2 IMPLANT
GOWN STRL REUS W/TWL XL LVL3 (GOWN DISPOSABLE) ×4 IMPLANT
HOLDER FOLEY CATH W/STRAP (MISCELLANEOUS) ×2 IMPLANT
IRRIG SUCT STRYKERFLOW 2 WTIP (MISCELLANEOUS) ×2
IRRIGATION SUCT STRKRFLW 2 WTP (MISCELLANEOUS) ×1 IMPLANT
KIT BASIN OR (CUSTOM PROCEDURE TRAY) ×2 IMPLANT
KIT TURNOVER KIT A (KITS) ×2 IMPLANT
MANIPULATOR UTERINE 4.5 ZUMI (MISCELLANEOUS) ×2 IMPLANT
MARKER SKIN DUAL TIP RULER LAB (MISCELLANEOUS) ×2 IMPLANT
MESH Y UPSYLON VAGINAL (Mesh General) ×2 IMPLANT
OCCLUDER COLPOPNEUMO (BALLOONS) IMPLANT
PACKING VAGINAL (PACKING) IMPLANT
PAD POSITIONING PINK XL (MISCELLANEOUS) ×2 IMPLANT
POUCH SPECIMEN RETRIEVAL 10MM (ENDOMECHANICALS) ×2 IMPLANT
SCISSORS LAP 5X45 EPIX DISP (ENDOMECHANICALS) ×2 IMPLANT
SEAL CANN UNIV 5-8 DVNC XI (MISCELLANEOUS) ×4 IMPLANT
SEAL XI 5MM-8MM UNIVERSAL (MISCELLANEOUS) ×8
SET IRRIG Y TYPE TUR BLADDER L (SET/KITS/TRAYS/PACK) IMPLANT
SET TUBE SMOKE EVAC HIGH FLOW (TUBING) ×2 IMPLANT
SHEET LAVH (DRAPES) ×2 IMPLANT
SOLUTION ELECTROLUBE (MISCELLANEOUS) ×2 IMPLANT
SUT MNCRL AB 4-0 PS2 18 (SUTURE) ×4 IMPLANT
SUT PROLENE 2 0 CT 1 (SUTURE) ×4 IMPLANT
SUT VIC AB 0 CT1 27 (SUTURE) ×2
SUT VIC AB 0 CT1 27XBRD ANTBC (SUTURE) ×1 IMPLANT
SUT VIC AB 2-0 SH 27 (SUTURE) ×14
SUT VIC AB 2-0 SH 27XBRD (SUTURE) ×7 IMPLANT
SUT VIC AB 3-0 SH 27 (SUTURE) ×2
SUT VIC AB 3-0 SH 27X BRD (SUTURE) ×1 IMPLANT
SUT VICRYL 0 UR6 27IN ABS (SUTURE) ×2 IMPLANT
SUT VLOC 180 2-0 9IN GS21 (SUTURE) ×2 IMPLANT
SYR 50ML LL SCALE MARK (SYRINGE) IMPLANT
SYR BULB IRRIG 60ML STRL (SYRINGE) IMPLANT
TOWEL OR 17X26 10 PK STRL BLUE (TOWEL DISPOSABLE) ×2 IMPLANT
TRAY LAPAROSCOPIC (CUSTOM PROCEDURE TRAY) ×2 IMPLANT
TROCAR ENDOPATH XCEL 12X100 BL (ENDOMECHANICALS) IMPLANT
TROCAR XCEL 12X100 BLDLESS (ENDOMECHANICALS) ×2 IMPLANT
TROCAR XCEL NON-BLD 5MMX100MML (ENDOMECHANICALS) ×2 IMPLANT
WATER STERILE IRR 1000ML POUR (IV SOLUTION) ×2 IMPLANT

## 2020-06-13 NOTE — Transfer of Care (Signed)
Immediate Anesthesia Transfer of Care Note  Patient: [name]  Procedure(s) Performed: XI ROBOTIC ASSISTED LAPAROSCOPIC SACROCOLPOPEXY AND SUPRACERVICAL HYSTERECTOMY AND BILATERAL  SALPINGO OOPHERECTOMY (Bilateral )  Patient Location: PACU  Anesthesia Type:General  Level of Consciousness: awake  Airway & Oxygen Therapy: Patient Spontanous Breathing and Patient connected to face mask oxygen  Post-op Assessment: Report given to RN, Post -op Vital signs reviewed and stable and Patient moving all extremities X 4  Post vital signs: Reviewed and stable  Last Vitals:  Vitals Value Taken Time  BP    Temp    Pulse 80 06/13/20 1136  Resp 10 06/13/20 1136  SpO2 99 % 06/13/20 1136  Vitals shown include unvalidated device data.  Last Pain:  Vitals:   06/13/20 0537  TempSrc:   PainSc: 0-No pain         Complications: No complications documented.

## 2020-06-13 NOTE — Plan of Care (Signed)

## 2020-06-13 NOTE — Anesthesia Postprocedure Evaluation (Signed)
Anesthesia Post Note  Patient: [name]  Procedure(s) Performed: XI ROBOTIC ASSISTED LAPAROSCOPIC SACROCOLPOPEXY AND SUPRACERVICAL HYSTERECTOMY AND BILATERAL  SALPINGO OOPHERECTOMY (Bilateral )     Patient location during evaluation: PACU Anesthesia Type: General Level of consciousness: awake and alert Pain management: pain level controlled Vital Signs Assessment: post-procedure vital signs reviewed and stable Respiratory status: spontaneous breathing, nonlabored ventilation and respiratory function stable Cardiovascular status: blood pressure returned to baseline and stable Postop Assessment: no apparent nausea or vomiting Anesthetic complications: no   No complications documented.  Last Vitals:  Vitals:   06/13/20 1329 06/13/20 1333  BP:  136/79  Pulse:  65  Resp: 11 12  Temp:  36.4 C  SpO2:  99%    Last Pain:  Vitals:   06/13/20 1333  TempSrc: Axillary  PainSc:                  Lidia Collum

## 2020-06-13 NOTE — H&P (Signed)
65 year old female who presents today for 2nd opinion regarding her pelvic organ prolapse. Her prolapse was 1st noted in December 2021. She started seeing Dr. Matilde Sprang in February 2022. Over the course of the last 3 months she has been completely evaluated and the plan was to perform an anterior repair transvaginally as well as a transvaginal hysterectomy. She had urodynamics demonstrating no evidence of significant intrinsic sphincter deficiency stress urinary incontinence. Her bladder function was relatively stable. The patient has struggled recently with constipation. This is been an ongoing seen for her over the last several months.   The patient was seen last week by Dr. Matilde Sprang and noted to have progressively worse prolapse with complete procidentia. She was treated for a urinary tract infection. The patient is in significant pain in her low lower abdomen. Her prolapse is completely out of her vagina. She has to push everything back in that he there urinate or defecate. She is in significant misery.    She is a past surgical history of an appendectomy as well as a cholecystectomy. She had shoulder arthroscopy most recently approximately 15 months ago did well from that.   The patient maintains her uterus.   She is a nonsmoker. She has no history of heart attack or stroke. She is a runner, and otherwise in good health.   Intv: The patient presents today for preop discussion. She is still complaining of suprapubic pain in severe difficulty with urination.     ALLERGIES: No Allergies    MEDICATIONS: Cipro 250 mg tablet 1 tablet PO BID  Trimethoprim 100 mg tablet 1 tablet PO Daily  Adderall Xr 20 mg capsule, ext release 24 hr 1 capsule PO Daily  Celebrex 200 mg capsule 1 capsule PO BID  Oxycodone Hcl 10 mg tablet 1 tablet PO PRN  Pregabalin 300 mg capsule 1 capsule PO BID  Valtrex 1,000 mg tablet 1 tablet PO Daily  Verapamil Hcl 120 mg tablet 1 tablet PO Q HS     GU PSH: Complex  cystometrogram, w/ void pressure and urethral pressure profile studies, any technique - 04/19/2020 Complex Uroflow - 04/19/2020 Cystoscopy - 04/30/2020 Emg surf Electrd - 04/19/2020 Inject For cystogram - 04/19/2020 Intrabd voidng Press - 04/19/2020     NON-GU PSH: Appendectomy Neck Spinal Fusion - 2017 Remove Gallbladder - 01/09/2018 Shoulder Arthroscopy/surgery - 03/13/2019     GU PMH: Cystocele, midline - 06/03/2020, - 05/30/2020, - 04/30/2020, - 04/19/2020, - 03/12/2020 Urinary Frequency - 06/03/2020, - 04/19/2020, - 03/12/2020 Mixed incontinence - 05/30/2020, - 04/30/2020, - 04/19/2020, - 03/12/2020    NON-GU PMH: Arthritis Hypercholesterolemia Inflammatory liver disease, unspecified    FAMILY HISTORY: 1 - Son   SOCIAL HISTORY: Marital Status: Single Current Smoking Status: Patient has never smoked.   Tobacco Use Assessment Completed: Used Tobacco in last 30 days? Does not use smokeless tobacco. Has never drank.  Drinks 1 caffeinated drink per day.    REVIEW OF SYSTEMS:    GU Review Female:   Patient denies frequent urination, hard to postpone urination, burning /pain with urination, get up at night to urinate, leakage of urine, stream starts and stops, trouble starting your stream, have to strain to urinate, and being pregnant.  Gastrointestinal (Upper):   Patient denies nausea, vomiting, and indigestion/ heartburn.  Gastrointestinal (Lower):   Patient denies diarrhea and constipation.  Constitutional:   Patient denies fever, night sweats, weight loss, and fatigue.  Skin:   Patient denies skin rash/ lesion and itching.  Eyes:   Patient denies  blurred vision and double vision.  Ears/ Nose/ Throat:   Patient denies sore throat and sinus problems.  Hematologic/Lymphatic:   Patient denies swollen glands and easy bruising.  Cardiovascular:   Patient denies leg swelling and chest pains.  Respiratory:   Patient denies cough and shortness of breath.  Endocrine:   Patient denies excessive thirst.   Musculoskeletal:   Patient denies back pain and joint pain.  Neurological:   Patient denies headaches and dizziness.  Psychologic:   Patient denies depression and anxiety.   VITAL SIGNS:      06/06/2020 08:06 AM  Weight 106 lb / 48.08 kg  Height 61 in / 154.94 cm  BP 116/79 mmHg  Pulse 67 /min  Temperature 97.5 F / 36.3 C  BMI 20.0 kg/m   GU PHYSICAL EXAMINATION:      Notes: The patient has complete player wraps of her uterus, her cervix is several inches beyond the introitus.  Was able to the reduce this, and with a half speculum she was noted to have no rectal prolapse.  She appears to have anterior apically descent  The vaginal mucosa is healthy appearing and she has no introital stenosis.   MULTI-SYSTEM PHYSICAL EXAMINATION:    Constitutional: Well-nourished. No physical deformities. Normally developed. Good grooming.  Respiratory: Normal breath sounds. No labored breathing, no use of accessory muscles.   Cardiovascular: Regular rate and rhythm. No murmur, no gallop. Normal temperature, normal extremity pulses, no swelling, no varicosities.      PAST DATA REVIEW: None   PROCEDURES: None   ASSESSMENT:      ICD-10 Details  1 GU:   Cystocele, midline - N81.11    PLAN:            Medications New Meds: Cipro 250 mg tablet 1 tablet PO BID   #14  0 Refill(s)  Nystatin 100,000 unit/gram cream 1 bead Per Vagina BID   #45  0 Refill(s)            Schedule Return Visit/Planned Activity: Keep Scheduled Appointment - Office Visit          Document Letter(s):  Created for Patient: Clinical Summary         Notes:   I went over robotic-assisted laparoscopic sacral colpopexy with the patient detail. I explained to the patient the rationale for the surgery. I also went over the placement of the laparoscopic ports. I detailed to her the surgery as well as the postoperative recovery time. I explained to the patient that she could expect to be in the hospital at least one or 2  nights. She will require 4 weeks of no heavy lifting, 6 weeks of no bending or twisting. She will not be able to use her vagina for 6 weeks. I discussed complications of the operation including injury to bowel, ureters, bladder. We also discussed the risk of failure as well as the complications of mesh. I explained to them the difference between transvaginal mesh and the mesh used for sacral colpopexy. I reassured them that there has not been an FDA warnings in regards to sacral colpopexy mesh. We will plan to get this prior to her surgery. I spent 45 minutes with the patient going over that ins and outs of the surgery and answering all her questions.   In addition, we will plan to do a hysterectomy, supracervical, and bilateral salpingo oophorectomy. I explained the rationale for all this.   I have opted to keep the patient on antibiotics leading  up to her surgery given her most recent infection and significant difficulty emptying. I also have given her some anti fungal cream for her symptoms of itching.

## 2020-06-13 NOTE — Interval H&P Note (Signed)
History and Physical Interval Note:  06/13/2020 6:49 AM  [name]  has presented today for surgery, with the diagnosis of PELVIC ORGAN PROLAPSE.  The various methods of treatment have been discussed with the patient and family. After consideration of risks, benefits and other options for treatment, the patient has consented to  Procedure(s): XI ROBOTIC ASSISTED LAPAROSCOPIC SACROCOLPOPEXY AND SUPRACERVICAL HYSTERECTOMY AND BILATERAL  SALPINGO OOPHERECTOMY (Bilateral) as a surgical intervention.  The patient's history has been reviewed, patient examined, no change in status, stable for surgery.  I have reviewed the patient's chart and labs.  Questions were answered to the patient's satisfaction.     Ardis Hughs

## 2020-06-13 NOTE — Anesthesia Procedure Notes (Signed)
Procedure Name: Intubation Date/Time: 06/13/2020 7:27 AM Performed by: Niel Hummer, CRNA Pre-anesthesia Checklist: Patient identified, Emergency Drugs available, Suction available and Patient being monitored Patient Re-evaluated:Patient Re-evaluated prior to induction Oxygen Delivery Method: Circle system utilized Preoxygenation: Pre-oxygenation with 100% oxygen Induction Type: IV induction Ventilation: Mask ventilation without difficulty Laryngoscope Size: Mac and 3 Grade View: Grade I Tube type: Oral Tube size: 7.0 mm Number of attempts: 1 Airway Equipment and Method: Stylet Placement Confirmation: ETT inserted through vocal cords under direct vision,  positive ETCO2 and breath sounds checked- equal and bilateral Secured at: 22 cm Tube secured with: Tape Dental Injury: Teeth and Oropharynx as per pre-operative assessment

## 2020-06-14 ENCOUNTER — Encounter (HOSPITAL_COMMUNITY): Payer: Self-pay | Admitting: Urology

## 2020-06-14 ENCOUNTER — Other Ambulatory Visit (HOSPITAL_COMMUNITY): Payer: Self-pay

## 2020-06-14 DIAGNOSIS — N8501 Benign endometrial hyperplasia: Secondary | ICD-10-CM | POA: Diagnosis not present

## 2020-06-14 LAB — CBC
HCT: 36.3 % (ref 36.0–46.0)
Hemoglobin: 11.9 g/dL — ABNORMAL LOW (ref 12.0–15.0)
MCH: 32.5 pg (ref 26.0–34.0)
MCHC: 32.8 g/dL (ref 30.0–36.0)
MCV: 99.2 fL (ref 80.0–100.0)
Platelets: 132 10*3/uL — ABNORMAL LOW (ref 150–400)
RBC: 3.66 MIL/uL — ABNORMAL LOW (ref 3.87–5.11)
RDW: 13.2 % (ref 11.5–15.5)
WBC: 6.3 10*3/uL (ref 4.0–10.5)
nRBC: 0 % (ref 0.0–0.2)

## 2020-06-14 LAB — BASIC METABOLIC PANEL
Anion gap: 5 (ref 5–15)
BUN: 15 mg/dL (ref 8–23)
CO2: 26 mmol/L (ref 22–32)
Calcium: 8.4 mg/dL — ABNORMAL LOW (ref 8.9–10.3)
Chloride: 109 mmol/L (ref 98–111)
Creatinine, Ser: 0.81 mg/dL (ref 0.44–1.00)
GFR, Estimated: >60 mL/min (ref >60)
Glucose, Bld: 125 mg/dL — ABNORMAL HIGH (ref 70–99)
Potassium: 4.2 mmol/L (ref 3.5–5.1)
Sodium: 140 mmol/L (ref 135–145)

## 2020-06-14 MED ORDER — DOCUSATE SODIUM 100 MG PO CAPS: 100.0000 mg | ORAL_CAPSULE | Freq: Two times a day (BID) | ORAL | 0 refills | Status: DC | PRN

## 2020-06-14 MED ORDER — ESTROGENS, CONJUGATED 0.625 MG/GM VA CREA
1.0000 | TOPICAL_CREAM | VAGINAL | 99 refills | Status: DC
  Filled 2020-06-14: qty 30, 30d supply, fill #0

## 2020-06-14 MED ORDER — TRAMADOL HCL 50 MG PO TABS
50.0000 mg | ORAL_TABLET | Freq: Four times a day (QID) | ORAL | 0 refills | Status: DC | PRN
  Filled 2020-06-14: qty 15, 2d supply, fill #0

## 2020-06-14 MED ORDER — CIPROFLOXACIN HCL 250 MG PO TABS
ORAL_TABLET | ORAL | 0 refills | Status: DC
  Filled 2020-06-14: qty 6, 3d supply, fill #0

## 2020-06-14 NOTE — Progress Notes (Signed)
Discharge instructions reviewed with patient, along with post op care instructions. Patient declined any questions. IV removed, catheter remained intact upon removal. This RN transferred patient downstairs via wheelchair, accompanied by sister in law.

## 2020-06-14 NOTE — Discharge Summary (Signed)
Date of admission: 06/13/2020  Date of discharge: 06/14/2020  Admission diagnosis: pelvic organ prolapse  Discharge diagnosis: same  Secondary diagnoses:  Patient Active Problem List   Diagnosis Date Noted  . Prolapse of female pelvic organs 06/13/2020  . Bladder prolapse, female, acquired 02/02/2020  . Raynaud's disease without gangrene 02/10/2019  . Acute pain of left knee 03/23/2018  . Gallstones 01/07/2018  . ADHD (attention deficit hyperactivity disorder), inattentive type 11/24/2016  . Onychomycosis due to dermatophyte 08/23/2015  . Arthralgia of multiple joints 06/28/2015  . Liver fibrosis 04/02/2015  . Radiculopathy 12/12/2014  . Anemia due to other cause 10/11/2014  . Substance abuse in remission ([place]) 08/28/2014  . Chronic hepatitis C without hepatic coma ([facility]) 07/06/2014  . MRSA PNEUMONIA 10/01/2008  . MUSCULOSKELETAL PAIN 09/27/2008  . ALLERGIC RHINITIS 04/12/2008  . CELLULITIS AND ABSCESS OF FACE 04/12/2008    Procedures performed: Procedure(s): XI ROBOTIC ASSISTED LAPAROSCOPIC SACROCOLPOPEXY AND SUPRACERVICAL HYSTERECTOMY AND BILATERAL  SALPINGO OOPHERECTOMY  History and Physical: For full details, please see admission history and physical. Briefly, [name] is a 65 y.o. year old patient with severe/symptomatic pelvic organ prolapse.   Hospital Course: Patient tolerated the procedure well.  She was then transferred to the floor after an uneventful PACU stay.  Her hospital course was uncomplicated.  On POD#1 she had met discharge criteria: was eating a regular diet, was up and ambulating independently,  pain was well controlled, was voiding without a catheter, and was ready to for discharge.  PE: Vitals:   06/13/20 1654 06/13/20 2100 06/14/20 0204 06/14/20 0613  BP: (!) 142/75 130/81 107/62 116/70  Pulse: 61 62 (!) 55 63  Resp: _0 Temp: 98.4 F (36.9 C)  98 F (36.7 C) 98 F (36.7 C)  TempSrc: Axillary  Oral Oral  SpO2: 98% 98% 96%  93%  Weight:      Height:       NAD  Intake/Output Summary (Last 24 hours) at 06/14/2020 0852 Last data filed at 06/14/2020 0700 Gross per 24 hour  Intake 3265.02 ml  Output 4635 ml  Net -1369.98 ml   Non-labored breathing Abdomen is soft/appropriately tender, incisions c/d/i Extremities symmetric   Laboratory values:  Recent Labs    06/11/20 1405 06/14/20 0417  WBC 3.9* 6.3  HGB 12.8 11.9*  HCT 39.2 36.3   Recent Labs    06/11/20 1405 06/14/20 0417  NA 140 140  K 4.4 4.2  CL 105 109  CO2 29 26  GLUCOSE 78 125*  BUN 17 15  CREATININE 0.76 0.81  CALCIUM 8.9 8.4*   Recent Labs    06/11/20 1405  INR 1.0   No results for input(s): LABURIN in the last 72 hours. Results for orders placed or performed during the hospital encounter of 06/11/20  SARS CORONAVIRUS 2 (TAT 6-24 HRS) Nasopharyngeal Nasopharyngeal Swab     Status: None   Collection Time: 06/11/20  3:14 PM   Specimen: Nasopharyngeal Swab  Result Value Ref Range Status   SARS Coronavirus 2 NEGATIVE NEGATIVE Final    Comment: (NOTE) SARS-CoV-2 target nucleic acids are NOT DETECTED.  The SARS-CoV-2 RNA is generally detectable in upper and lower respiratory specimens during the acute phase of infection. Negative results do not preclude SARS-CoV-2 infection, do not rule out co-infections with other pathogens, and should not be used as the sole basis for treatment or other patient management decisions. Negative results must be combined with clinical observations, patient history, and epidemiological information.  The expected result is Negative.  Fact Sheet for Patients: SugarRoll.be  Fact Sheet for Healthcare Providers: https://www.woods-mathews.com/  This test is not yet approved or cleared by the [place] FDA and  has been authorized for detection and/or diagnosis of SARS-CoV-2 by FDA under an Emergency Use Authorization (EUA). This EUA will remain  in  effect (meaning this test can be used) for the duration of the COVID-19 declaration under Se ction 564(b)(1) of the Act, 21 U.S.C. section 360bbb-3(b)(1), unless the authorization is terminated or revoked sooner.  Performed at [facility], [place] [address], [place], [place] [address]     Disposition: Home  Discharge instruction: The patient was instructed to be ambulatory but told to refrain from heavy lifting, strenuous activity, or driving.   Discharge medications:  Allergies as of 06/14/2020   No Known Allergies     Medication List    STOP taking these medications   nystatin cream Commonly known as: MYCOSTATIN   trimethoprim 100 MG tablet Commonly known as: TRIMPEX     TAKE these medications   albuterol 108 (90 Base) MCG/ACT inhaler Commonly known as: VENTOLIN HFA Inhale 2 puffs into the lungs every 4 (four) hours as needed for wheezing or shortness of breath.   amphetamine-dextroamphetamine 20 MG 24 hr capsule Commonly known as: Adderall XR Take 1 capsule (20 mg total) by mouth daily.   celecoxib 200 MG capsule Commonly known as: CELEBREX TAKE 1 CAPSULE BY MOUTH TWICE DAILY What changed: how much to take   cetirizine 10 MG tablet Commonly known as: ZYRTEC Take 10 mg by mouth daily.   ciprofloxacin 250 MG tablet Commonly known as: CIPRO Take 1 tablet by mouth twice a day (Take 1 tablet by mouth twice a day) What changed:   how much to take  how to take this  when to take this   conjugated estrogens vaginal cream Commonly known as: PREMARIN Place 1 Applicatorful vaginally every other day. Use small bead no tip of finger and swap inside of vagina every other day (do not need an applicator full) What changed: additional instructions   docusate sodium 100 MG capsule Commonly known as: COLACE Take 1 capsule (100 mg total) by mouth 2 (two) times daily as needed (take to keep stool soft.).   polyethylene glycol powder 17 GM/SCOOP powder Commonly  known as: GLYCOLAX/MIRALAX TAKE 17 GRAMS BY MOUTH 2 TIMES DAILY AS NEEDED. What changed: See the new instructions.   pregabalin 300 MG capsule Commonly known as: LYRICA TAKE 1 CAPSULE BY MOUTH TWICE DAILY What changed: how much to take   REFRESH OPTIVE PF OP Apply 1 drop to eye daily as needed (dry eyes).   tobramycin 0.3 % ophthalmic solution Commonly known as: Tobrex Place 2 drops into the right eye every 4 (four) hours. What changed:   when to take this  reasons to take this   traMADol 50 MG tablet Commonly known as: ULTRAM Take 1-2 tablets (50-100 mg total) by mouth every 6 (six) hours as needed for moderate pain.   valACYclovir 1000 MG tablet Commonly known as: VALTREX TAKE 1 TABLET BY MOUTH DAILY. What changed: how much to take   verapamil 120 MG CR tablet Commonly known as: CALAN-SR TAKE 1 TABLET BY MOUTH AT BEDTIME What changed: how much to take       Followup:   Follow-up Information    Karen Kays, NP On 06/27/2020.   Specialty: Nurse Practitioner Why: 10am Contact information: [address] [place] [address] [phone]

## 2020-06-14 NOTE — Plan of Care (Signed)
  Problem: Health Behavior/Discharge Planning: Goal: Ability to manage health-related needs will improve Outcome: Progressing   Problem: Activity: Goal: Risk for activity intolerance will decrease Outcome: Progressing   Problem: Pain Managment: Goal: General experience of comfort will improve Outcome: Progressing   Problem: Safety: Goal: Ability to remain free from injury will improve Outcome: Progressing   Problem: Skin Integrity: Goal: Risk for impaired skin integrity will decrease Outcome: Progressing

## 2020-06-14 NOTE — Discharge Instructions (Signed)

## 2020-06-17 NOTE — Op Note (Signed)
Preoperative diagnosis:   1. Pelvic organ prolapse  Postoperative diagnosis:   1. Same  Procedure: 1. Robotic-assisted laparoscopic supracervical hysterectomy and  bilateral salpingo-oopherectomy 2. Robotic-assisted laparoscopic sacrocolpopexy  Surgeon: Ardis Hughs, MD First assistant: Dr. Dutch Gray, MD  An assistant was required for this surgical procedure.  The duties of the assistant included but were not limited to suctioning, passing suture, camera manipulation, retraction. This procedure would not be able to be performed without an Environmental consultant.   Anesthesia: General  Complications: None  Intraoperative findings:  [employer] Y mesh used for the sacrocolpopexy.  EBL: 50 mL  Specimens: Supracervical hysterectomy and salpingo-ectomy  Indication: [name] is a 65 y.o. female patient with symptomatic pelvic organ prolapse.    After reviewing the management options for treatment, she elected to proceed with the above surgical procedure(s). We have discussed the potential benefits and risks of the procedure, side effects of the proposed treatment, the likelihood of the patient achieving the goals of the procedure, and any potential problems that might occur during the procedure or recuperation. Informed consent has been obtained.  Description of procedure:  The patient was taken to the operating room and general anesthesia was induced.  The patient was placed in the dorsal lithotomy position, prepped and draped in the usual sterile fashion, and preoperative antibiotics were administered. A preoperative time-out was performed.    A Foley catheter was then placed and placed to gravity drainage. I then made a periumbilical incision carrying the dissection down to the patient's fascia with electrocautery.  Once to the fascia, the fascia was incised and a small puncture hole made in the peritoneum to allow passage of a 15mm port.   The abdomen was  insufflated and the remaining ports placed under digital guidance.  2 ports were placed lateral to the umbilicus on the right proximally 10 cm apart.  The most lateral port was approximately 3 cm above the anterior iliac spine.  2 additional ports were placed in the patient's right side in comparable positions to the most lateral port on the right was a 12 mm port.the robot was then docked at an angle from the leg obliquely along the side of the left leg.  We then began our surgery by cleaning up some of the pelvic adhesions to the small bowel and colon.  Once this was completed I started dissecting at the sacral promontory located 3 cm medial to the location where the ureter crosses over the iliac vessels at the pelvic brim. The posterior peritoneum was incised and the sacral prominence cleared off an area taking care to avoid the middle sacral vessels and the iliac branches.  I then created a posterior peritoneal tunnel starting at the sacral promontory and tunneling down the right pelvic sidewall down into the pelvis breaking back through the posterior peritoneum around the vesico-vaginal junction posteriorly.  I then continued the posterior dissection retracting down on the rectum and finding the avascular plane between the posterior vaginal wall and the rectum.  I carried this dissection down as far as I could to along the area of the perineal body.  Then focused my attention to the uterus and hysterectomy.  I first started by taking the right round ligament with a series of by polar cautery.  I then dissected the anterior leaf of the broad ligament slightly more proximal and then distally down across the anterior mucosa salpinx and the internal cervical os.  I then took of the uterine ovarian  ligament on the right and dissected free the right salpinx. Once the anterior leaf of the broad ligament had been completely dissected on the patient's right and a small bladder flap had been created anteriorly  attention was turned to the left side where a similar dissection was carried out. I then turned my attention to the anterior plane between the anterior vaginal wall and the bladder.  I was able to obtain access to the avascular plane and with a combination of both monopolar cautery and blunt dissection was able to clean and nice down to the bladder neck.  I then turned my attention back to the patient's uterus and skeletonized the right uterine artery and vein and then took this with a series of bipolar moves.  I then performed a similar uterus pedicle ligation on the left.  This point I was able to identify the patient's cervix and came through supracervical with monopolar cautery once the uterus was freed from all its attachments it was pushed into the left paracolic gutter and our attention was turned to placing the wire mesh.  Mesh was measured at approximately 7 cm anteriorly and 7 cm posteriorly and I cut this on the back table.  The mesh was then placed into the patient's abdomen through the assistant port and the anterior leaf was secured down onto the anterior vaginal wall with the apex at the bladder neck.  The posterior leaf was then secured down on the posterior vaginal wall.  These were sewn down with 2-0 Vicryl.  Between 6 and 8 were done on each side.  At this point I then went back to the previously dissected sacral promontory and posterior peritoneal tunnel and inserted a instrument through the tunnel and grasped the end of the mesh at the vaginal cuff and pull it up to the sacrum.  I then checked to ensure that the sacral mesh was not too tight by performing a vaginal exam.  I then secured the sacral leg of the mesh using a 0 Prolene.  I then reapproximated the posterior peritoneum with a 2-0 Vicryl in a running fashion around the sacral promontory.  The pelvic peritoneum was closed using a pursestring.  A small Endo Catch bag was then gently passed through the assistant port and the uterus was  placed in the bag.  The bag was then brought out through the camera port once the trochars were removed.  We then made a slightly larger extraction incision to remove the uterus.  The fascia was then closed with 0 Vicryl in a figure-of-eight fashion.  The skin was closed with 4-0 Monocryl's.  Dermabond was applied to the incision and exparel injected into the incisions.  The patient was subsequently extubated and returned to the PACU in excellent condition.

## 2020-06-18 LAB — SURGICAL PATHOLOGY

## 2020-06-27 DIAGNOSIS — N8111 Cystocele, midline: Secondary | ICD-10-CM | POA: Diagnosis not present

## 2020-06-29 ENCOUNTER — Other Ambulatory Visit (HOSPITAL_COMMUNITY): Payer: Self-pay

## 2020-07-01 ENCOUNTER — Other Ambulatory Visit: Payer: Self-pay | Admitting: Family Medicine

## 2020-07-01 MED ORDER — AMPHETAMINE-DEXTROAMPHET ER 20 MG PO CP24: 20.0000 mg | ORAL_CAPSULE | Freq: Every day | ORAL | 0 refills | Status: DC

## 2020-07-01 NOTE — Telephone Encounter (Signed)
Last video visit- 06/13/20 Pharmacy has been updated

## 2020-07-08 ENCOUNTER — Other Ambulatory Visit (HOSPITAL_COMMUNITY): Payer: Self-pay

## 2020-07-09 ENCOUNTER — Other Ambulatory Visit (HOSPITAL_COMMUNITY): Payer: Self-pay

## 2020-07-10 ENCOUNTER — Other Ambulatory Visit (HOSPITAL_COMMUNITY): Payer: Self-pay

## 2020-07-16 ENCOUNTER — Encounter: Payer: Self-pay | Admitting: Family Medicine

## 2020-07-16 ENCOUNTER — Telehealth (INDEPENDENT_AMBULATORY_CARE_PROVIDER_SITE_OTHER): Payer: 59 | Admitting: Family Medicine

## 2020-07-16 ENCOUNTER — Other Ambulatory Visit (HOSPITAL_COMMUNITY): Payer: 59

## 2020-07-16 DIAGNOSIS — S80212A Abrasion, left knee, initial encounter: Secondary | ICD-10-CM | POA: Diagnosis not present

## 2020-07-16 DIAGNOSIS — L03116 Cellulitis of left lower limb: Secondary | ICD-10-CM | POA: Diagnosis not present

## 2020-07-16 MED ORDER — CEPHALEXIN 500 MG PO CAPS: 500.0000 mg | ORAL_CAPSULE | Freq: Three times a day (TID) | ORAL | 0 refills | Status: AC

## 2020-07-16 NOTE — Progress Notes (Signed)
Subjective:    Patient ID: [name], female    DOB: 1955/03/27, 65 y.o.   MRN: [id number]  HPI Virtual Visit via Video Note  I connected with the patient on 07/16/20 at  3:30 PM EDT by a video enabled telemedicine application and verified that I am speaking with the correct person using two identifiers.  Location patient: home Location provider:work or home office Persons participating in the virtual visit: patient, provider  I discussed the limitations of evaluation and management by telemedicine and the availability of in person appointments. The patient expressed understanding and agreed to proceed.   HPI: Here for a reaction to an elastic bandage and possible infection. One week ago while out walking, she tripped and fell, striking her left knee on the ground. This caused a small abrasion which she cleaned with soap and water. She then applied a Bandaid which was in place for several days. The area began to itch, so she pulled the Bandaid off about 3 days ago. She then saw that the skin beneath the sticky part of the Bandaid had a reaction, and this alarmed her. This area is painful and it drains straw colored fluid. No fever. She has never reacted to a bandage like this.   ROS: See pertinent positives and negatives per HPI.  Past Medical History:  Diagnosis Date   ARDS (adult respiratory distress syndrome) ([place]) 2007   Arthritis    Depression    Gallstones 01/07/2018   Ganglion cyst of dorsum of right wrist    Headache    MRSA (methicillin resistant Staphylococcus aureus) 2007   Osteoporosis    Pneumonia 2009   Prosthetic eye globe    right eye    Raynaud's disease     Past Surgical History:  Procedure Laterality Date   ANTERIOR CERVICAL DECOMP/DISCECTOMY FUSION N/A 12/12/2014   Procedure: ANTERIOR CERVICAL DECOMPRESSION/DISCECTOMY FUSION 3 LEVELS;  Surgeon: Phylliss Bob, MD;  Location: [place];  Service: Orthopedics;  Laterality: N/A;  Anterior cervical  decompression fusion, cerivcal 5-6, cervical 6-7, cervical 7-thoracic 1 with instrumentation and allografrt   APPENDECTOMY  1974   CATARACT EXTRACTION Left 11/2018   CHOLECYSTECTOMY N/A 01/07/2018   Procedure: LAPAROSCOPIC CHOLECYSTECTOMY WITH INTRAOPERATIVE CHOLANGIOGRAM;  Surgeon: Fanny Skates, MD;  Location: [place];  Service: General;  Laterality: N/A;   COLONOSCOPY  04/14/2013   per Dr. Ardis Hughs, clear, repeat in 10 yrs    CORNEAL TRANSPLANT Right 2007   x4   ENUCLEATION Right 2013   eye infection   GANGLION CYST EXCISION Right 05/25/2016   Procedure: RIGHT WRIST ULNAR GANGLION EXCISION;  Surgeon: Milly Jakob, MD;  Location: [place];  Service: Orthopedics;  Laterality: Right;   RHINOPLASTY  1980   ROBOTIC ASSISTED LAPAROSCOPIC SACROCOLPOPEXY Bilateral 06/13/2020   Procedure: XI ROBOTIC ASSISTED LAPAROSCOPIC SACROCOLPOPEXY AND SUPRACERVICAL HYSTERECTOMY AND BILATERAL  SALPINGO OOPHERECTOMY;  Surgeon: Ardis Hughs, MD;  Location: [facility];  Service: Urology;  Laterality: Bilateral;   TONSILLECTOMY     removed in 3rd grade   TOTAL SHOULDER ARTHROPLASTY Left 03/13/2019   Procedure: TOTAL SHOULDER ARTHROPLASTY;  Surgeon: Tania Ade, MD;  Location: [place];  Service: Orthopedics;  Laterality: Left;    Family History  Problem Relation Age of Onset   Breast cancer Maternal Aunt    Colon cancer Neg Hx      Current Outpatient Medications:    amphetamine-dextroamphetamine (ADDERALL XR) 20 MG 24 hr capsule, Take 1 capsule (20 mg total) by mouth  daily., Disp: 30 capsule, Rfl: 0   Carboxymethylcellul-Glycerin (REFRESH OPTIVE PF OP), Apply 1 drop to eye daily as needed (dry eyes)., Disp: , Rfl:    celecoxib (CELEBREX) 200 MG capsule, TAKE 1 CAPSULE BY MOUTH TWICE DAILY (Patient taking differently: Take 200 mg by mouth 2 (two) times daily.), Disp: 180 capsule, Rfl: 3   cephALEXin (KEFLEX) 500 MG capsule, Take 1 capsule (500 mg total) by mouth 3  (three) times daily for 10 days., Disp: 30 capsule, Rfl: 0   cetirizine (ZYRTEC) 10 MG tablet, Take 10 mg by mouth daily., Disp: , Rfl:    ciprofloxacin (CIPRO) 250 MG tablet, Take 1 tablet by mouth twice a day, Disp: 6 tablet, Rfl: 0   conjugated estrogens (PREMARIN) vaginal cream, Use small bead on tip of finger and swab inside of vagina every other day., Disp: 42.5 g, Rfl: PRN   docusate sodium (COLACE) 100 MG capsule, Take 1 capsule (100 mg total) by mouth 2 (two) times daily as needed (take to keep stool soft.)., Disp: 60 capsule, Rfl: 0   polyethylene glycol powder (GLYCOLAX/MIRALAX) powder, TAKE 17 GRAMS BY MOUTH 2 TIMES DAILY AS NEEDED. (Patient taking differently: Take 0.5 Containers by mouth daily as needed for moderate constipation.), Disp: 3162 g, Rfl: 1   pregabalin (LYRICA) 300 MG capsule, TAKE 1 CAPSULE BY MOUTH TWICE DAILY (Patient taking differently: Take 300 mg by mouth 2 (two) times daily.), Disp: 60 capsule, Rfl: 5   tobramycin (TOBREX) 0.3 % ophthalmic solution, Place 2 drops into the right eye every 4 (four) hours. (Patient taking differently: Place 2 drops into the right eye daily as needed (eye infection).), Disp: 5 mL, Rfl: 1   valACYclovir (VALTREX) 1000 MG tablet, TAKE 1 TABLET BY MOUTH DAILY. (Patient taking differently: Take 1,000 mg by mouth daily.), Disp: 90 tablet, Rfl: 11   verapamil (CALAN-SR) 120 MG CR tablet, TAKE 1 TABLET BY MOUTH AT BEDTIME (Patient taking differently: Take 120 mg by mouth at bedtime.), Disp: 90 tablet, Rfl: 3   albuterol (VENTOLIN HFA) 108 (90 Base) MCG/ACT inhaler, Inhale 2 puffs into the lungs every 4 (four) hours as needed for wheezing or shortness of breath., Disp: 18 g, Rfl: 0   traMADol (ULTRAM) 50 MG tablet, Take 1-2 tablets (50-100 mg total) by mouth every 6 (six) hours as needed for moderate pain., Disp: 15 tablet, Rfl: 0  EXAM:  VITALS per patient if applicable:  GENERAL: alert, oriented, appears well and in no acute  distress  HEENT: atraumatic, conjunttiva clear, no obvious abnormalities on inspection of external nose and ears  NECK: normal movements of the head and neck  LUNGS: on inspection no signs of respiratory distress, breathing rate appears normal, no obvious gross SOB, gasping or wheezing  CV: no obvious cyanosis  MS: moves all visible extremities without noticeable abnormality  PSYCH/NEURO: pleasant and cooperative, no obvious depression or anxiety, speech and thought processing grossly intact  SKIN: the anterior left knee has an abrasion that looks clean. However this is surrounded by a band of erythematous and crusty skin where the adhesive portion of the bandage would have been. No other streaking in the area  ASSESSMENT AND PLAN: She has had an abrasion to the knee that looks like it is healing nicely. However she has had an allergic reaction to the adhesive from a Bandaid, and this has now become infected. She is applying Mupiricin ointment BID. We will add Keflex TID for 10 days. Avoid covering the area with a bandage  at this point.  [name], MD  Discussed the following assessment and plan:  No diagnosis found.     I discussed the assessment and treatment plan with the patient. The patient was provided an opportunity to ask questions and all were answered. The patient agreed with the plan and demonstrated an understanding of the instructions.   The patient was advised to call back or seek an in-person evaluation if the symptoms worsen or if the condition fails to improve as anticipated.      Review of Systems     Objective:   Physical Exam        Assessment & Plan:

## 2020-07-23 ENCOUNTER — Ambulatory Visit: Admit: 2020-07-23 | Payer: 59 | Admitting: Urology

## 2020-07-23 SURGERY — ANTERIOR (CYSTOCELE) AND POSTERIOR REPAIR (RECTOCELE)
Anesthesia: General

## 2020-07-30 ENCOUNTER — Other Ambulatory Visit: Payer: Self-pay

## 2020-07-31 ENCOUNTER — Other Ambulatory Visit (HOSPITAL_COMMUNITY): Payer: Self-pay

## 2020-07-31 ENCOUNTER — Encounter: Payer: Self-pay | Admitting: Family Medicine

## 2020-07-31 ENCOUNTER — Ambulatory Visit: Payer: 59 | Admitting: Family Medicine

## 2020-07-31 ENCOUNTER — Other Ambulatory Visit: Payer: Self-pay | Admitting: Family Medicine

## 2020-07-31 VITALS — BP 128/82 | HR 66 | Temp 97.2°F | Wt 109.2 lb

## 2020-07-31 DIAGNOSIS — F1190 Opioid use, unspecified, uncomplicated: Secondary | ICD-10-CM | POA: Diagnosis not present

## 2020-07-31 DIAGNOSIS — M2550 Pain in unspecified joint: Secondary | ICD-10-CM | POA: Diagnosis not present

## 2020-07-31 MED ORDER — OXYCODONE HCL 10 MG PO TABS: 10.0000 mg | ORAL_TABLET | Freq: Four times a day (QID) | ORAL | 0 refills | Status: DC | PRN

## 2020-07-31 MED ORDER — AMPHETAMINE-DEXTROAMPHET ER 20 MG PO CP24: 20.0000 mg | ORAL_CAPSULE | Freq: Every day | ORAL | 0 refills | Status: DC

## 2020-07-31 MED ORDER — AMPHETAMINE-DEXTROAMPHET ER 20 MG PO CP24
20.0000 mg | ORAL_CAPSULE | Freq: Every day | ORAL | 0 refills | Status: DC
  Filled 2020-07-31: qty 30, 30d supply, fill #0

## 2020-07-31 NOTE — Telephone Encounter (Signed)
Patient was seen in office today 07/31/20.  Please advise

## 2020-07-31 NOTE — Progress Notes (Signed)
   Subjective:    Patient ID: [name], female    DOB: Sep 02, 1955, 65 y.o.   MRN: [id number]  HPI Here for pain management, she is doing well.    Review of Systems  Constitutional: Negative.   Musculoskeletal:  Positive for arthralgias.      Objective:   Physical Exam Constitutional:      Appearance: Normal appearance.  Neurological:     Mental Status: She is alert.          Assessment & Plan:  Pain management. Indication for chronic opioid: OA Medication and dose: Oxycodone 10 mg # pills per month: 120 Last UDS date: 06-03-20 Opioid Treatment Agreement signed (Y/N): 04-30-17 Opioid Treatment Agreement last reviewed with patient:  07-31-20 NCCSRS reviewed this encounter (include red flags): Yes Meds were refilled.  [name], MD

## 2020-07-31 NOTE — Telephone Encounter (Signed)
Pt is calling in stating that her Rx Adderall should have been for this month and not September.  Pt would like to see if it can be called in for July, August and September.  Pt would like to have a call back to let her know when it has been called in.

## 2020-08-01 ENCOUNTER — Other Ambulatory Visit (HOSPITAL_COMMUNITY): Payer: Self-pay

## 2020-08-01 DIAGNOSIS — R8271 Bacteriuria: Secondary | ICD-10-CM | POA: Diagnosis not present

## 2020-08-01 DIAGNOSIS — N3946 Mixed incontinence: Secondary | ICD-10-CM | POA: Diagnosis not present

## 2020-08-01 MED ORDER — AMPHETAMINE-DEXTROAMPHET ER 20 MG PO CP24: 20.0000 mg | ORAL_CAPSULE | ORAL | 0 refills | Status: DC

## 2020-08-01 MED ORDER — AMPHETAMINE-DEXTROAMPHET ER 20 MG PO CP24
20.0000 mg | ORAL_CAPSULE | Freq: Every morning | ORAL | 0 refills | Status: DC
  Filled 2020-08-01: qty 30, 30d supply, fill #0

## 2020-08-01 NOTE — Telephone Encounter (Signed)
The printed rx are ready to be picked up

## 2020-08-01 NOTE — Telephone Encounter (Signed)
Pt state that she is ok to pick up a written Rx for Adderall, will pick up shortly. Please advise

## 2020-08-01 NOTE — Telephone Encounter (Signed)
This is the same problem at [facility] we had with her pain medication. I cannot keep doing this over and over. Ask her if we can send these to [facility] like we did the pain medication

## 2020-08-01 NOTE — Telephone Encounter (Signed)
Pt picked up Rx from the office this afternoon.

## 2020-08-06 ENCOUNTER — Other Ambulatory Visit (HOSPITAL_COMMUNITY): Payer: Self-pay

## 2020-08-07 ENCOUNTER — Other Ambulatory Visit (HOSPITAL_COMMUNITY): Payer: Self-pay

## 2020-08-14 DIAGNOSIS — M19011 Primary osteoarthritis, right shoulder: Secondary | ICD-10-CM | POA: Diagnosis not present

## 2020-08-14 DIAGNOSIS — M25511 Pain in right shoulder: Secondary | ICD-10-CM | POA: Diagnosis not present

## 2020-08-28 ENCOUNTER — Other Ambulatory Visit: Payer: Self-pay | Admitting: Family Medicine

## 2020-08-28 ENCOUNTER — Other Ambulatory Visit (HOSPITAL_COMMUNITY): Payer: Self-pay

## 2020-08-28 ENCOUNTER — Encounter: Payer: Self-pay | Admitting: Family Medicine

## 2020-08-28 NOTE — Telephone Encounter (Signed)
LOV was on 07/31/2020 Last refill done on 08/01/2020 for 30 tablets Please advise

## 2020-08-30 ENCOUNTER — Other Ambulatory Visit (HOSPITAL_COMMUNITY): Payer: Self-pay

## 2020-08-30 MED ORDER — AMPHETAMINE-DEXTROAMPHET ER 20 MG PO CP24: 20.0000 mg | ORAL_CAPSULE | ORAL | 0 refills | Status: DC

## 2020-08-30 MED ORDER — AMPHETAMINE-DEXTROAMPHET ER 20 MG PO CP24
20.0000 mg | ORAL_CAPSULE | Freq: Every morning | ORAL | 0 refills | Status: DC
  Filled 2020-08-30: qty 30, 30d supply, fill #0

## 2020-08-30 NOTE — Telephone Encounter (Signed)
Done

## 2020-08-30 NOTE — Telephone Encounter (Signed)
I sent in the refills this morning

## 2020-09-04 ENCOUNTER — Other Ambulatory Visit (HOSPITAL_COMMUNITY): Payer: Self-pay

## 2020-09-04 MED FILL — Verapamil HCl Tab ER 120 MG: ORAL | 90 days supply | Qty: 90 | Fill #0 | Status: AC

## 2020-09-05 ENCOUNTER — Other Ambulatory Visit (HOSPITAL_COMMUNITY): Payer: Self-pay

## 2020-09-06 ENCOUNTER — Other Ambulatory Visit (HOSPITAL_COMMUNITY): Payer: Self-pay

## 2020-09-07 ENCOUNTER — Other Ambulatory Visit (HOSPITAL_COMMUNITY): Payer: Self-pay

## 2020-09-07 MED FILL — Celecoxib Cap 200 MG: ORAL | 90 days supply | Qty: 180 | Fill #1 | Status: AC

## 2020-09-08 ENCOUNTER — Other Ambulatory Visit: Payer: Self-pay

## 2020-09-09 ENCOUNTER — Other Ambulatory Visit (HOSPITAL_COMMUNITY): Payer: Self-pay

## 2020-09-09 MED ORDER — VALACYCLOVIR HCL 1 G PO TABS
1000.0000 mg | ORAL_TABLET | Freq: Every day | ORAL | 0 refills | Status: DC
  Filled 2020-09-09: qty 90, 90d supply, fill #0

## 2020-09-10 ENCOUNTER — Other Ambulatory Visit: Payer: Self-pay | Admitting: Internal Medicine

## 2020-09-11 ENCOUNTER — Other Ambulatory Visit (HOSPITAL_COMMUNITY): Payer: Self-pay

## 2020-09-13 ENCOUNTER — Other Ambulatory Visit (HOSPITAL_COMMUNITY): Payer: Self-pay

## 2020-09-26 ENCOUNTER — Other Ambulatory Visit (HOSPITAL_COMMUNITY): Payer: Self-pay

## 2020-09-26 DIAGNOSIS — N3946 Mixed incontinence: Secondary | ICD-10-CM | POA: Diagnosis not present

## 2020-09-26 DIAGNOSIS — N8111 Cystocele, midline: Secondary | ICD-10-CM | POA: Diagnosis not present

## 2020-09-27 ENCOUNTER — Telehealth: Payer: Self-pay | Admitting: Family Medicine

## 2020-09-27 ENCOUNTER — Other Ambulatory Visit (HOSPITAL_COMMUNITY): Payer: Self-pay

## 2020-09-27 MED ORDER — AMPHETAMINE-DEXTROAMPHET ER 20 MG PO CP24
20.0000 mg | ORAL_CAPSULE | ORAL | 0 refills | Status: DC
  Filled 2020-11-28: qty 30, 30d supply, fill #0

## 2020-09-27 MED ORDER — AMPHETAMINE-DEXTROAMPHET ER 20 MG PO CP24
20.0000 mg | ORAL_CAPSULE | ORAL | 0 refills | Status: DC
  Filled 2020-10-28: qty 30, 30d supply, fill #0

## 2020-09-27 MED ORDER — AMPHETAMINE-DEXTROAMPHET ER 20 MG PO CP24
20.0000 mg | ORAL_CAPSULE | Freq: Every morning | ORAL | 0 refills | Status: DC
  Filled 2020-09-28: qty 30, 30d supply, fill #0

## 2020-09-27 NOTE — Telephone Encounter (Signed)
Pt  states that the request is for her Adderall

## 2020-09-27 NOTE — Telephone Encounter (Signed)
Pharmacy was calling because earliest fill date for prescription was 9/5. Pharmacy is closed on that day so they are asking for earlier fill date of Saturday 9/3    Good callback number is [phone]    Please Advise

## 2020-09-27 NOTE — Telephone Encounter (Signed)
I sent in 3 refills, beginning with 09-28-20

## 2020-09-27 NOTE — Telephone Encounter (Signed)
Pt notified of refills through [place]

## 2020-09-28 ENCOUNTER — Other Ambulatory Visit (HOSPITAL_COMMUNITY): Payer: Self-pay

## 2020-10-02 DIAGNOSIS — M25511 Pain in right shoulder: Secondary | ICD-10-CM | POA: Diagnosis not present

## 2020-10-02 DIAGNOSIS — M19011 Primary osteoarthritis, right shoulder: Secondary | ICD-10-CM | POA: Diagnosis not present

## 2020-10-03 ENCOUNTER — Other Ambulatory Visit (HOSPITAL_COMMUNITY): Payer: Self-pay | Admitting: Orthopedic Surgery

## 2020-10-03 DIAGNOSIS — M25511 Pain in right shoulder: Secondary | ICD-10-CM

## 2020-10-05 ENCOUNTER — Other Ambulatory Visit (HOSPITAL_COMMUNITY): Payer: Self-pay

## 2020-10-07 ENCOUNTER — Other Ambulatory Visit (HOSPITAL_COMMUNITY): Payer: Self-pay

## 2020-10-09 ENCOUNTER — Other Ambulatory Visit (HOSPITAL_COMMUNITY): Payer: Self-pay

## 2020-10-15 ENCOUNTER — Other Ambulatory Visit (HOSPITAL_COMMUNITY): Payer: Self-pay | Admitting: Orthopedic Surgery

## 2020-10-15 DIAGNOSIS — M25511 Pain in right shoulder: Secondary | ICD-10-CM

## 2020-10-18 ENCOUNTER — Telehealth: Payer: 59 | Admitting: Family

## 2020-10-18 DIAGNOSIS — M545 Low back pain, unspecified: Secondary | ICD-10-CM | POA: Diagnosis not present

## 2020-10-18 MED ORDER — PREDNISONE 10 MG (21) PO TBPK: ORAL_TABLET | ORAL | 0 refills | Status: DC

## 2020-10-18 MED ORDER — BACLOFEN 10 MG PO TABS: 10.0000 mg | ORAL_TABLET | Freq: Three times a day (TID) | ORAL | 0 refills | Status: DC

## 2020-10-18 MED ORDER — NAPROXEN 500 MG PO TABS: 500.0000 mg | ORAL_TABLET | Freq: Two times a day (BID) | ORAL | 0 refills | Status: DC

## 2020-10-18 NOTE — Progress Notes (Signed)

## 2020-10-21 ENCOUNTER — Telehealth (INDEPENDENT_AMBULATORY_CARE_PROVIDER_SITE_OTHER): Payer: 59 | Admitting: Family Medicine

## 2020-10-21 ENCOUNTER — Encounter: Payer: Self-pay | Admitting: Family Medicine

## 2020-10-21 DIAGNOSIS — F1190 Opioid use, unspecified, uncomplicated: Secondary | ICD-10-CM | POA: Diagnosis not present

## 2020-10-21 DIAGNOSIS — M2550 Pain in unspecified joint: Secondary | ICD-10-CM

## 2020-10-21 NOTE — Progress Notes (Signed)
Subjective:    Patient ID: [name], female    DOB: 09/05/1955, 65 y.o.   MRN: [id number]  HPI Virtual Visit via Video Note  I connected with the patient on 10/22/20 at  3:00 PM EDT by a video enabled telemedicine application and verified that I am speaking with the correct person using two identifiers.  Location patient: home Location provider:work or home office Persons participating in the virtual visit: patient, provider  I discussed the limitations of evaluation and management by telemedicine and the availability of in person appointments. The patient expressed understanding and agreed to proceed.   HPI: Here for pain management, she is doing well.   ROS: See pertinent positives and negatives per HPI.  Past Medical History:  Diagnosis Date   ARDS (adult respiratory distress syndrome) ([place]) 2007   Arthritis    Depression    Gallstones 01/07/2018   Ganglion cyst of dorsum of right wrist    Headache    MRSA (methicillin resistant Staphylococcus aureus) 2007   Osteoporosis    Pneumonia 2009   Prosthetic eye globe    right eye    Raynaud's disease     Past Surgical History:  Procedure Laterality Date   ANTERIOR CERVICAL DECOMP/DISCECTOMY FUSION N/A 12/12/2014   Procedure: ANTERIOR CERVICAL DECOMPRESSION/DISCECTOMY FUSION 3 LEVELS;  Surgeon: Phylliss Bob, MD;  Location: [place];  Service: Orthopedics;  Laterality: N/A;  Anterior cervical decompression fusion, cerivcal 5-6, cervical 6-7, cervical 7-thoracic 1 with instrumentation and allografrt   APPENDECTOMY  1974   CATARACT EXTRACTION Left 11/2018   CHOLECYSTECTOMY N/A 01/07/2018   Procedure: LAPAROSCOPIC CHOLECYSTECTOMY WITH INTRAOPERATIVE CHOLANGIOGRAM;  Surgeon: Fanny Skates, MD;  Location: [place];  Service: General;  Laterality: N/A;   COLONOSCOPY  04/14/2013   per Dr. Ardis Hughs, clear, repeat in 10 yrs    CORNEAL TRANSPLANT Right 2007   x4   ENUCLEATION Right 2013   eye infection   GANGLION CYST  EXCISION Right 05/25/2016   Procedure: RIGHT WRIST ULNAR GANGLION EXCISION;  Surgeon: Milly Jakob, MD;  Location: [place];  Service: Orthopedics;  Laterality: Right;   RHINOPLASTY  1980   ROBOTIC ASSISTED LAPAROSCOPIC SACROCOLPOPEXY Bilateral 06/13/2020   Procedure: XI ROBOTIC ASSISTED LAPAROSCOPIC SACROCOLPOPEXY AND SUPRACERVICAL HYSTERECTOMY AND BILATERAL  SALPINGO OOPHERECTOMY;  Surgeon: Ardis Hughs, MD;  Location: [facility];  Service: Urology;  Laterality: Bilateral;   TONSILLECTOMY     removed in 3rd grade   TOTAL SHOULDER ARTHROPLASTY Left 03/13/2019   Procedure: TOTAL SHOULDER ARTHROPLASTY;  Surgeon: Tania Ade, MD;  Location: [place];  Service: Orthopedics;  Laterality: Left;    Family History  Problem Relation Age of Onset   Breast cancer Maternal Aunt    Colon cancer Neg Hx      Current Outpatient Medications:    amphetamine-dextroamphetamine (ADDERALL XR) 20 MG 24 hr capsule, Take 1 capsule (20 mg total) by mouth every morning., Disp: 30 capsule, Rfl: 0   [START ON 10/28/2020] amphetamine-dextroamphetamine (ADDERALL XR) 20 MG 24 hr capsule, Take 1 capsule (20 mg total) by mouth every morning., Disp: 30 capsule, Rfl: 0   [START ON 11/28/2020] amphetamine-dextroamphetamine (ADDERALL XR) 20 MG 24 hr capsule, Take 1 capsule (20 mg total) by mouth every morning., Disp: 30 capsule, Rfl: 0   baclofen (LIORESAL) 10 MG tablet, Take 1 tablet (10 mg total) by mouth 3 (three) times daily., Disp: 30 each, Rfl: 0   Carboxymethylcellul-Glycerin (REFRESH OPTIVE PF OP), Apply 1 drop to eye daily  as needed (dry eyes)., Disp: , Rfl:    celecoxib (CELEBREX) 200 MG capsule, TAKE 1 CAPSULE BY MOUTH TWICE DAILY (Patient taking differently: Take 200 mg by mouth 2 (two) times daily.), Disp: 180 capsule, Rfl: 3   cetirizine (ZYRTEC) 10 MG tablet, Take 10 mg by mouth daily., Disp: , Rfl:    conjugated estrogens (PREMARIN) vaginal cream, Use small bead on tip of  finger and swab inside of vagina every other day., Disp: 42.5 g, Rfl: PRN   docusate sodium (COLACE) 100 MG capsule, Take 1 capsule (100 mg total) by mouth 2 (two) times daily as needed (take to keep stool soft.)., Disp: 60 capsule, Rfl: 0   naproxen (NAPROSYN) 500 MG tablet, Take 1 tablet (500 mg total) by mouth 2 (two) times daily with a meal., Disp: 30 tablet, Rfl: 0   Oxycodone HCl 10 MG TABS, Take 1 tablet (10 mg total) by mouth every 6 (six) hours as needed (pain)., Disp: 120 tablet, Rfl: 0   polyethylene glycol powder (GLYCOLAX/MIRALAX) powder, TAKE 17 GRAMS BY MOUTH 2 TIMES DAILY AS NEEDED. (Patient taking differently: Take 0.5 Containers by mouth daily as needed for moderate constipation.), Disp: 3162 g, Rfl: 1   predniSONE (STERAPRED UNI-PAK 21 TAB) 10 MG (21) TBPK tablet, Use as directed, Disp: 21 tablet, Rfl: 0   pregabalin (LYRICA) 300 MG capsule, TAKE 1 CAPSULE BY MOUTH TWICE DAILY (Patient taking differently: Take 300 mg by mouth 2 (two) times daily.), Disp: 60 capsule, Rfl: 5   tobramycin (TOBREX) 0.3 % ophthalmic solution, Place 2 drops into the right eye every 4 (four) hours. (Patient taking differently: Place 2 drops into the right eye daily as needed (eye infection).), Disp: 5 mL, Rfl: 1   valACYclovir (VALTREX) 1000 MG tablet, TAKE 1 TABLET BY MOUTH DAILY., Disp: 90 tablet, Rfl: 0   verapamil (CALAN-SR) 120 MG CR tablet, TAKE 1 TABLET BY MOUTH AT BEDTIME (Patient taking differently: Take 120 mg by mouth at bedtime.), Disp: 90 tablet, Rfl: 3  EXAM:  VITALS per patient if applicable:  GENERAL: alert, oriented, appears well and in no acute distress  HEENT: atraumatic, conjunttiva clear, no obvious abnormalities on inspection of external nose and ears  NECK: normal movements of the head and neck  LUNGS: on inspection no signs of respiratory distress, breathing rate appears normal, no obvious gross SOB, gasping or wheezing  CV: no obvious cyanosis  MS: moves all visible  extremities without noticeable abnormality  PSYCH/NEURO: pleasant and cooperative, no obvious depression or anxiety, speech and thought processing grossly intact  ASSESSMENT AND PLAN: Pain management. Indication for chronic opioid: OA Medication and dose: Oxycodone 10 mg # pills per month: 120 Last UDS date: 06-03-20 Opioid Treatment Agreement signed (Y/N): 04-30-17 Opioid Treatment Agreement last reviewed with patient:  10-21-20 NCCSRS reviewed this encounter (include red flags): Yes Meds were refilled. [name], MD  Discussed the following assessment and plan:  No diagnosis found.     I discussed the assessment and treatment plan with the patient. The patient was provided an opportunity to ask questions and all were answered. The patient agreed with the plan and demonstrated an understanding of the instructions.   The patient was advised to call back or seek an in-person evaluation if the symptoms worsen or if the condition fails to improve as anticipated.      Review of Systems     Objective:   Physical Exam        Assessment & Plan:

## 2020-10-22 ENCOUNTER — Other Ambulatory Visit (HOSPITAL_COMMUNITY): Payer: Self-pay

## 2020-10-22 ENCOUNTER — Other Ambulatory Visit: Payer: Self-pay | Admitting: Family Medicine

## 2020-10-22 MED ORDER — CELECOXIB 200 MG PO CAPS
200.0000 mg | ORAL_CAPSULE | Freq: Two times a day (BID) | ORAL | 3 refills | Status: DC
  Filled 2020-10-22: qty 180, fill #0
  Filled 2021-01-04: qty 180, 90d supply, fill #0
  Filled 2021-04-26: qty 180, 90d supply, fill #1

## 2020-10-22 MED ORDER — OXYCODONE HCL 10 MG PO TABS: 10.0000 mg | ORAL_TABLET | Freq: Four times a day (QID) | ORAL | 0 refills | Status: DC | PRN

## 2020-10-22 MED ORDER — OXYCODONE HCL 10 MG PO TABS: 10.0000 mg | ORAL_TABLET | Freq: Four times a day (QID) | ORAL | 0 refills | Status: AC | PRN

## 2020-10-22 NOTE — Telephone Encounter (Signed)
Last refill-05/02/20--60 tabs, 5 refills  Last video visit- 10/21/20  No future office visit scheduled.   Can this patient receive a refill?

## 2020-10-23 ENCOUNTER — Other Ambulatory Visit (HOSPITAL_COMMUNITY): Payer: Self-pay

## 2020-10-23 ENCOUNTER — Ambulatory Visit (HOSPITAL_COMMUNITY)
Admission: RE | Admit: 2020-10-23 | Discharge: 2020-10-23 | Disposition: A | Discharge status: Home / Self Care | Payer: 59 | Source: Ambulatory Visit | Attending: Orthopedic Surgery | Admitting: Orthopedic Surgery

## 2020-10-23 DIAGNOSIS — M25511 Pain in right shoulder: Secondary | ICD-10-CM | POA: Insufficient documentation

## 2020-10-23 MED ORDER — PREGABALIN 300 MG PO CAPS
ORAL_CAPSULE | Freq: Two times a day (BID) | ORAL | 5 refills | Status: DC
  Filled 2020-10-23: qty 60, 30d supply, fill #0
  Filled 2020-11-28: qty 60, 30d supply, fill #1
  Filled 2020-12-26: qty 60, 30d supply, fill #2
  Filled 2021-01-27: qty 60, 30d supply, fill #3
  Filled 2021-02-28: qty 60, 30d supply, fill #4
  Filled 2021-03-30: qty 60, 30d supply, fill #5

## 2020-10-28 ENCOUNTER — Other Ambulatory Visit (HOSPITAL_COMMUNITY): Payer: Self-pay

## 2020-10-29 ENCOUNTER — Ambulatory Visit: Payer: 59 | Attending: Internal Medicine

## 2020-10-29 ENCOUNTER — Other Ambulatory Visit (HOSPITAL_COMMUNITY): Payer: Self-pay

## 2020-10-29 ENCOUNTER — Other Ambulatory Visit (HOSPITAL_BASED_OUTPATIENT_CLINIC_OR_DEPARTMENT_OTHER): Payer: Self-pay

## 2020-10-29 DIAGNOSIS — Z23 Encounter for immunization: Secondary | ICD-10-CM

## 2020-10-29 MED ORDER — INFLUENZA VAC SPLIT QUAD 0.5 ML IM SUSY
PREFILLED_SYRINGE | INTRAMUSCULAR | 0 refills | Status: DC
  Filled 2020-10-29: qty 0.5, 1d supply, fill #0

## 2020-10-29 NOTE — Progress Notes (Signed)
   Covid-19 Vaccination Clinic  Name:  [name]    MRN: [id number] DOB: Aug 29, 1955  10/29/2020  [name] was observed post Covid-19 immunization for 15 minutes without incident. She was provided with Vaccine Information Sheet and instruction to access the V-Safe system.   [name] was instructed to call 911 with any severe reactions post vaccine: Difficulty breathing  Swelling of face and throat  A fast heartbeat  A bad rash all over body  Dizziness and weakness

## 2020-11-06 DIAGNOSIS — M19211 Secondary osteoarthritis, right shoulder: Secondary | ICD-10-CM | POA: Diagnosis not present

## 2020-11-08 ENCOUNTER — Other Ambulatory Visit (HOSPITAL_BASED_OUTPATIENT_CLINIC_OR_DEPARTMENT_OTHER): Payer: Self-pay

## 2020-11-08 MED ORDER — COVID-19MRNA BIVAL VACC PFIZER 30 MCG/0.3ML IM SUSP
INTRAMUSCULAR | 0 refills | Status: DC
  Filled 2020-11-08: qty 0.3, 1d supply, fill #0

## 2020-11-22 ENCOUNTER — Other Ambulatory Visit: Payer: Self-pay | Admitting: Orthopedic Surgery

## 2020-11-22 DIAGNOSIS — Z01811 Encounter for preprocedural respiratory examination: Secondary | ICD-10-CM

## 2020-11-27 ENCOUNTER — Other Ambulatory Visit (HOSPITAL_COMMUNITY): Payer: Self-pay

## 2020-11-28 ENCOUNTER — Other Ambulatory Visit (HOSPITAL_COMMUNITY): Payer: Self-pay

## 2020-12-02 ENCOUNTER — Other Ambulatory Visit (HOSPITAL_COMMUNITY): Payer: Self-pay

## 2020-12-03 ENCOUNTER — Other Ambulatory Visit (HOSPITAL_COMMUNITY): Payer: Self-pay

## 2020-12-05 DIAGNOSIS — N3946 Mixed incontinence: Secondary | ICD-10-CM | POA: Diagnosis not present

## 2020-12-15 ENCOUNTER — Other Ambulatory Visit: Payer: Self-pay | Admitting: Internal Medicine

## 2020-12-16 ENCOUNTER — Other Ambulatory Visit (HOSPITAL_COMMUNITY): Payer: Self-pay

## 2020-12-16 MED ORDER — VERAPAMIL HCL ER 120 MG PO TBCR
EXTENDED_RELEASE_TABLET | Freq: Every day | ORAL | 0 refills | Status: DC
  Filled 2020-12-16: qty 90, 90d supply, fill #0

## 2020-12-19 ENCOUNTER — Other Ambulatory Visit (HOSPITAL_COMMUNITY): Payer: Self-pay

## 2020-12-23 ENCOUNTER — Other Ambulatory Visit (HOSPITAL_COMMUNITY): Payer: Self-pay

## 2020-12-23 MED ORDER — VALACYCLOVIR HCL 1 G PO TABS
1000.0000 mg | ORAL_TABLET | Freq: Every day | ORAL | 0 refills | Status: DC
  Filled 2020-12-23: qty 90, 90d supply, fill #0

## 2020-12-26 ENCOUNTER — Other Ambulatory Visit: Payer: Self-pay | Admitting: Family Medicine

## 2020-12-26 ENCOUNTER — Other Ambulatory Visit (HOSPITAL_COMMUNITY): Payer: Self-pay

## 2020-12-27 ENCOUNTER — Other Ambulatory Visit (HOSPITAL_COMMUNITY): Payer: Self-pay

## 2020-12-27 MED ORDER — AMPHETAMINE-DEXTROAMPHET ER 20 MG PO CP24
20.0000 mg | ORAL_CAPSULE | ORAL | 0 refills | Status: DC
  Filled 2020-12-27: qty 30, 30d supply, fill #0

## 2020-12-27 MED ORDER — AMPHETAMINE-DEXTROAMPHET ER 20 MG PO CP24
20.0000 mg | ORAL_CAPSULE | ORAL | 0 refills | Status: DC
  Filled 2021-01-27: qty 30, 30d supply, fill #0

## 2020-12-27 MED ORDER — AMPHETAMINE-DEXTROAMPHET ER 20 MG PO CP24
20.0000 mg | ORAL_CAPSULE | ORAL | 0 refills | Status: DC
  Filled 2021-02-28: qty 30, 30d supply, fill #0

## 2020-12-27 NOTE — Telephone Encounter (Signed)
Last refill- 09/28/20--30 tabs, 3 refills Last office visit- 10/21/20  No future office visit scheduled. Can this patient receive a refill

## 2020-12-27 NOTE — Telephone Encounter (Signed)
Done

## 2020-12-30 ENCOUNTER — Other Ambulatory Visit (HOSPITAL_COMMUNITY): Payer: Self-pay

## 2021-01-04 ENCOUNTER — Other Ambulatory Visit (HOSPITAL_COMMUNITY): Payer: Self-pay

## 2021-01-16 ENCOUNTER — Ambulatory Visit: Payer: 59 | Admitting: Nurse Practitioner

## 2021-01-16 ENCOUNTER — Ambulatory Visit: Admit: 2021-01-16 | Payer: 59 | Admitting: Orthopedic Surgery

## 2021-01-16 SURGERY — ARTHROPLASTY, SHOULDER, TOTAL, REVERSE
Anesthesia: Choice | Site: Shoulder | Laterality: Right

## 2021-01-21 ENCOUNTER — Ambulatory Visit: Payer: 59 | Admitting: Family Medicine

## 2021-01-27 ENCOUNTER — Other Ambulatory Visit (HOSPITAL_COMMUNITY): Payer: Self-pay

## 2021-01-27 ENCOUNTER — Telehealth: Payer: 59 | Admitting: Emergency Medicine

## 2021-01-27 DIAGNOSIS — H44001 Unspecified purulent endophthalmitis, right eye: Secondary | ICD-10-CM

## 2021-01-27 MED ORDER — POLYMYXIN B-TRIMETHOPRIM 10000-0.1 UNIT/ML-% OP SOLN: 1.0000 [drp] | OPHTHALMIC | 0 refills | Status: DC

## 2021-01-27 NOTE — Progress Notes (Signed)
E-Visit for [employer]   We are sorry that you are not feeling well.  Here is how we plan to help!  Based on what you have shared with me it looks like you have something similar to conjunctivitis.  Conjunctivitis is a common inflammatory or infectious condition of the eye that is often referred to as "pink eye".  In most cases it is contagious (viral or bacterial). However, not all conjunctivitis requires antibiotics (ex. Allergic).  We have made appropriate suggestions for you based upon your presentation.  I have prescribed Polytrim Ophthalmic drops 1-2 drops 4 times a day times 5 days  Pink eye can be highly contagious.  It is typically spread through direct contact with secretions, or contaminated objects or surfaces that one may have touched.  Strict handwashing is suggested with soap and water is urged.  If not available, use alcohol based had sanitizer.  Avoid unnecessary touching of the eye.  If you wear contact lenses, you will need to refrain from wearing them until you see no white discharge from the eye for at least 24 hours after being on medication.  You should see symptom improvement in 1-2 days after starting the medication regimen.  Call us if symptoms are not improved in 1-2 days.  Home Care: Wash your hands often! Do not wear your contacts until you complete your treatment plan. Avoid sharing towels, bed linen, personal items with a person who has pink eye. See attention for anyone in your home with similar symptoms.  Get Help Right Away If: Your symptoms do not improve. You develop blurred or loss of vision. Your symptoms worsen (increased discharge, pain or redness)   Thank you for choosing an e-visit.  Your e-visit answers were reviewed by a board certified advanced clinical practitioner to complete your personal care plan. Depending upon the condition, your plan could have included both over the counter or prescription medications.  Please review your pharmacy choice.  Make sure the pharmacy is open so you can pick up prescription now. If there is a problem, you may contact your provider through [employer] and have the prescription routed to another pharmacy.  Your safety is important to [place]. If you have drug allergies check your prescription carefully.   For the next 24 hours you can use MyChart to ask questions about today's visit, request a non-urgent call back, or ask for a work or school excuse. You will get an email in the next two days asking about your experience. I hope that your e-visit has been valuable and will speed your recovery.  I have spent 5 minutes in review of e-visit questionnaire, review and updating patient chart, medical decision making and response to patient.   Willeen Cass, PhD, FNP-BC

## 2021-01-29 ENCOUNTER — Encounter: Payer: Self-pay | Admitting: Family Medicine

## 2021-01-29 ENCOUNTER — Other Ambulatory Visit (HOSPITAL_COMMUNITY): Payer: Self-pay

## 2021-01-29 DIAGNOSIS — D225 Melanocytic nevi of trunk: Secondary | ICD-10-CM | POA: Diagnosis not present

## 2021-01-29 DIAGNOSIS — D485 Neoplasm of uncertain behavior of skin: Secondary | ICD-10-CM | POA: Diagnosis not present

## 2021-01-29 DIAGNOSIS — L814 Other melanin hyperpigmentation: Secondary | ICD-10-CM | POA: Diagnosis not present

## 2021-01-29 DIAGNOSIS — L718 Other rosacea: Secondary | ICD-10-CM | POA: Diagnosis not present

## 2021-01-29 DIAGNOSIS — L821 Other seborrheic keratosis: Secondary | ICD-10-CM | POA: Diagnosis not present

## 2021-01-29 DIAGNOSIS — L570 Actinic keratosis: Secondary | ICD-10-CM | POA: Diagnosis not present

## 2021-01-29 MED ORDER — METRONIDAZOLE 0.75 % EX CREA
TOPICAL_CREAM | CUTANEOUS | 2 refills | Status: DC
  Filled 2021-01-29: qty 45, 30d supply, fill #0

## 2021-01-30 ENCOUNTER — Other Ambulatory Visit (HOSPITAL_COMMUNITY): Payer: Self-pay

## 2021-02-03 ENCOUNTER — Encounter: Payer: Self-pay | Admitting: Family Medicine

## 2021-02-03 ENCOUNTER — Ambulatory Visit (INDEPENDENT_AMBULATORY_CARE_PROVIDER_SITE_OTHER): Payer: 59 | Admitting: Family Medicine

## 2021-02-03 VITALS — BP 138/80 | HR 77 | Wt 111.1 lb

## 2021-02-03 DIAGNOSIS — M2550 Pain in unspecified joint: Secondary | ICD-10-CM | POA: Diagnosis not present

## 2021-02-03 DIAGNOSIS — F1190 Opioid use, unspecified, uncomplicated: Secondary | ICD-10-CM

## 2021-02-03 MED ORDER — OXYCODONE HCL 10 MG PO TABS: 10.0000 mg | ORAL_TABLET | Freq: Four times a day (QID) | ORAL | 0 refills | Status: DC | PRN

## 2021-02-03 NOTE — Progress Notes (Signed)
° °  Subjective:    Patient ID: [name], female    DOB: [birth year], 66 y.o.   MRN: [id number]  HPI Here for pain management. She still struggles with OA, especially in the right shoulder. She also has a torn rotator cuff on the right shoulder, and she plans to have a total shoulder replacement surgery per Dr. Tamera Punt later this summer.    Review of Systems  Constitutional: Negative.   Respiratory: Negative.    Cardiovascular: Negative.   Musculoskeletal:  Positive for arthralgias.      Objective:   Physical Exam Constitutional:      Appearance: Normal appearance.  Cardiovascular:     Rate and Rhythm: Normal rate and regular rhythm.     Pulses: Normal pulses.     Heart sounds: Normal heart sounds.  Pulmonary:     Effort: Pulmonary effort is normal.     Breath sounds: Normal breath sounds.  Musculoskeletal:     Comments: Right shoulder is tender and has very limited ROM due to pain   Neurological:     Mental Status: She is alert.          Assessment & Plan:  Pain management.  Indication for chronic opioid: OA Medication and dose: Oxycodone 10 mg  # pills per month: 120 Last UDS date: 06-03-20 Opioid Treatment Agreement signed (Y/N): 04-30-17 Opioid Treatment Agreement last reviewed with patient:  02-03-21 NCCSRS reviewed this encounter (include red flags): Yes Meds were refilled.  [name], MD

## 2021-02-19 ENCOUNTER — Other Ambulatory Visit: Payer: Self-pay | Admitting: Family Medicine

## 2021-02-19 DIAGNOSIS — Z1231 Encounter for screening mammogram for malignant neoplasm of breast: Secondary | ICD-10-CM

## 2021-02-28 ENCOUNTER — Other Ambulatory Visit (HOSPITAL_COMMUNITY): Payer: Self-pay

## 2021-03-03 ENCOUNTER — Other Ambulatory Visit (HOSPITAL_COMMUNITY): Payer: Self-pay

## 2021-03-05 ENCOUNTER — Ambulatory Visit
Admission: RE | Admit: 2021-03-05 | Discharge: 2021-03-05 | Disposition: A | Discharge status: Home / Self Care | Payer: 59 | Source: Ambulatory Visit | Attending: Family Medicine | Admitting: Family Medicine

## 2021-03-05 ENCOUNTER — Ambulatory Visit: Payer: 59

## 2021-03-05 DIAGNOSIS — Z1231 Encounter for screening mammogram for malignant neoplasm of breast: Secondary | ICD-10-CM

## 2021-03-23 ENCOUNTER — Encounter: Payer: Self-pay | Admitting: Emergency Medicine

## 2021-03-23 ENCOUNTER — Telehealth: Payer: 59 | Admitting: Emergency Medicine

## 2021-03-23 DIAGNOSIS — R300 Dysuria: Secondary | ICD-10-CM | POA: Diagnosis not present

## 2021-03-23 MED ORDER — CEPHALEXIN 500 MG PO CAPS: 500.0000 mg | ORAL_CAPSULE | Freq: Three times a day (TID) | ORAL | 0 refills | Status: AC

## 2021-03-23 NOTE — Progress Notes (Signed)
I have spent 5 minutes in review of e-visit questionnaire, review and updating patient chart, medical decision making and response to patient.   Natayla Cadenhead, PA-C    

## 2021-03-23 NOTE — Progress Notes (Signed)

## 2021-03-29 ENCOUNTER — Other Ambulatory Visit (HOSPITAL_COMMUNITY): Payer: Self-pay

## 2021-03-29 ENCOUNTER — Other Ambulatory Visit: Payer: Self-pay | Admitting: Family Medicine

## 2021-03-30 ENCOUNTER — Other Ambulatory Visit (HOSPITAL_COMMUNITY): Payer: Self-pay

## 2021-03-31 ENCOUNTER — Other Ambulatory Visit (HOSPITAL_COMMUNITY): Payer: Self-pay

## 2021-03-31 MED ORDER — AMPHETAMINE-DEXTROAMPHET ER 20 MG PO CP24
20.0000 mg | ORAL_CAPSULE | ORAL | 0 refills | Status: DC
  Filled 2021-05-03: qty 30, 30d supply, fill #0
  Filled ????-??-??: fill #0

## 2021-03-31 MED ORDER — AMPHETAMINE-DEXTROAMPHET ER 20 MG PO CP24
20.0000 mg | ORAL_CAPSULE | ORAL | 0 refills | Status: DC
  Filled 2021-04-03: qty 30, 30d supply, fill #0

## 2021-03-31 MED ORDER — AMPHETAMINE-DEXTROAMPHET ER 20 MG PO CP24
20.0000 mg | ORAL_CAPSULE | ORAL | 0 refills | Status: DC
  Filled 2021-06-02: qty 30, 30d supply, fill #0

## 2021-03-31 NOTE — Telephone Encounter (Signed)
Done

## 2021-03-31 NOTE — Telephone Encounter (Addendum)
Last OV- 02/03/2021 ?Last refill- 03/01/2021 ? ?No future OV scheduled ?

## 2021-04-01 ENCOUNTER — Other Ambulatory Visit (HOSPITAL_COMMUNITY): Payer: Self-pay

## 2021-04-03 ENCOUNTER — Other Ambulatory Visit (HOSPITAL_COMMUNITY): Payer: Self-pay

## 2021-04-18 DIAGNOSIS — M19211 Secondary osteoarthritis, right shoulder: Secondary | ICD-10-CM | POA: Diagnosis not present

## 2021-04-26 ENCOUNTER — Other Ambulatory Visit: Payer: Self-pay | Admitting: Family Medicine

## 2021-04-27 ENCOUNTER — Other Ambulatory Visit (HOSPITAL_COMMUNITY): Payer: Self-pay

## 2021-04-28 ENCOUNTER — Other Ambulatory Visit (HOSPITAL_COMMUNITY): Payer: Self-pay

## 2021-04-28 MED ORDER — VERAPAMIL HCL ER 120 MG PO TBCR
EXTENDED_RELEASE_TABLET | Freq: Every day | ORAL | 0 refills | Status: DC
Start: 2021-04-28 — End: 2021-10-20
  Filled 2021-04-28: qty 90, 90d supply, fill #0

## 2021-05-01 ENCOUNTER — Encounter: Payer: Self-pay | Admitting: Family Medicine

## 2021-05-01 ENCOUNTER — Ambulatory Visit: Payer: 59 | Admitting: Family Medicine

## 2021-05-01 VITALS — BP 130/80 | HR 80 | Temp 97.7°F | Ht 61.5 in | Wt 109.8 lb

## 2021-05-01 DIAGNOSIS — F1190 Opioid use, unspecified, uncomplicated: Secondary | ICD-10-CM

## 2021-05-01 DIAGNOSIS — M2550 Pain in unspecified joint: Secondary | ICD-10-CM | POA: Diagnosis not present

## 2021-05-01 DIAGNOSIS — M5441 Lumbago with sciatica, right side: Secondary | ICD-10-CM

## 2021-05-01 DIAGNOSIS — M5430 Sciatica, unspecified side: Secondary | ICD-10-CM | POA: Diagnosis not present

## 2021-05-01 MED ORDER — OXYCODONE HCL 10 MG PO TABS: 10.0000 mg | ORAL_TABLET | Freq: Four times a day (QID) | ORAL | 0 refills | Status: DC | PRN

## 2021-05-01 MED ORDER — PREGABALIN 300 MG PO CAPS: 300.0000 mg | ORAL_CAPSULE | Freq: Three times a day (TID) | ORAL | 5 refills | Status: DC

## 2021-05-01 MED ORDER — METHYLPREDNISOLONE ACETATE 80 MG/ML IJ SUSP
120.0000 mg | Freq: Once | INTRAMUSCULAR | Status: AC
  Administered 2021-05-01: 120 mg via INTRAMUSCULAR

## 2021-05-01 NOTE — Addendum Note (Signed)
Addended by: Rosalyn Gess D on: 05/01/2021 09:32 AM ? ? Modules accepted: Orders ? ?

## 2021-05-01 NOTE — Addendum Note (Signed)
Addended by: Agnes Lawrence on: 05/01/2021 09:30 AM ? ? Modules accepted: Orders ? ?

## 2021-05-01 NOTE — Progress Notes (Signed)
? ?  Subjective:  ? ? Patient ID: [name], female    DOB: 1955/05/02, 66 y.o.   MRN: [id number] ? ?HPI ?Here for pain management. She is scheduled for a right total shoulder replacement in June. About 2 weeks ago she developed another bout of sciatica like the one she had in 2012, except this involves the right leg instead of the left. She has sharp pain in the right lower back that shoots down the right leg. She has numbness at times as well. No recent trauma. A lumbar MRI in 2012 revealed herniated discs at L2 through L5.  ? ? ?Review of Systems  ?Constitutional: Negative.   ?Musculoskeletal:  Positive for arthralgias and back pain.  ? ?   ?Objective:  ? Physical Exam ?Constitutional:   ?   Appearance: Normal appearance.  ?Musculoskeletal:  ?   Comments: Tender in the right lower back and over the right sciatic notch. ROM is full. SLR are negative   ?Neurological:  ?   Mental Status: She is alert.  ? ? ? ? ? ?   ?Assessment & Plan:  ?Pain management. ?Indication for chronic opioid: OA ?Medication and dose: Oxycodone 10 mg ?# pills per month: 120 ?Last UDS date: 06-03-20 ?Opioid Treatment Agreement signed (Y/N): 04-30-17 ?Opioid Treatment Agreement last reviewed with patient:  05-01-21 ?NCCSRS reviewed this encounter (include red flags): Yes ?The Oxycodone was refilled. We will increase the Pregabalin to 300 mg TID. Given a shot of DepoMedrol today.  ?[name], MD ? ? ?

## 2021-05-02 ENCOUNTER — Other Ambulatory Visit (HOSPITAL_COMMUNITY): Payer: Self-pay

## 2021-05-03 ENCOUNTER — Other Ambulatory Visit (HOSPITAL_COMMUNITY): Payer: Self-pay

## 2021-05-04 LAB — DRUG MONITOR, PANEL 1, W/CONF, URINE
Amphetamine: 772 ng/mL — ABNORMAL HIGH (ref <250)
Amphetamines: POSITIVE ng/mL — AB (ref <500)
Barbiturates: NEGATIVE ng/mL (ref <300)
Benzodiazepines: NEGATIVE ng/mL (ref <100)
Cocaine Metabolite: NEGATIVE ng/mL (ref <150)
Codeine: NEGATIVE ng/mL (ref <50)
Creatinine: 13.9 mg/dL — ABNORMAL LOW (ref >=20.0)
Hydrocodone: NEGATIVE ng/mL (ref <50)
Hydromorphone: NEGATIVE ng/mL (ref <50)
Marijuana Metabolite: NEGATIVE ng/mL (ref <20)
Methadone Metabolite: NEGATIVE ng/mL (ref <100)
Methamphetamine: NEGATIVE ng/mL (ref <250)
Morphine: NEGATIVE ng/mL (ref <50)
Norhydrocodone: NEGATIVE ng/mL (ref <50)
Noroxycodone: 1083 ng/mL — ABNORMAL HIGH (ref <50)
Opiates: NEGATIVE ng/mL (ref <100)
Oxidant: NEGATIVE ug/mL (ref <200)
Oxycodone: 1911 ng/mL — ABNORMAL HIGH (ref <50)
Oxycodone: POSITIVE ng/mL — AB (ref <100)
Oxymorphone: 610 ng/mL — ABNORMAL HIGH (ref <50)
Phencyclidine: NEGATIVE ng/mL (ref <25)
Specific Gravity: 1.003 (ref >=1.003)
pH: 6.8 (ref 4.5–9.0)

## 2021-05-04 LAB — DM TEMPLATE

## 2021-05-07 DIAGNOSIS — Z961 Presence of intraocular lens: Secondary | ICD-10-CM | POA: Diagnosis not present

## 2021-05-07 DIAGNOSIS — H0288A Meibomian gland dysfunction right eye, upper and lower eyelids: Secondary | ICD-10-CM | POA: Diagnosis not present

## 2021-05-07 DIAGNOSIS — H04122 Dry eye syndrome of left lacrimal gland: Secondary | ICD-10-CM | POA: Diagnosis not present

## 2021-05-07 DIAGNOSIS — Z970 Presence of artificial eye: Secondary | ICD-10-CM | POA: Diagnosis not present

## 2021-05-07 DIAGNOSIS — H0288B Meibomian gland dysfunction left eye, upper and lower eyelids: Secondary | ICD-10-CM | POA: Diagnosis not present

## 2021-05-15 ENCOUNTER — Other Ambulatory Visit (HOSPITAL_COMMUNITY): Payer: Self-pay

## 2021-05-15 MED ORDER — VALACYCLOVIR HCL 1 G PO TABS
ORAL_TABLET | ORAL | 3 refills | Status: DC
  Filled 2021-05-15: qty 90, 90d supply, fill #0
  Filled 2021-08-27: qty 90, 90d supply, fill #1
  Filled 2021-12-28: qty 90, 90d supply, fill #2
  Filled 2022-03-25: qty 90, 90d supply, fill #3

## 2021-05-16 ENCOUNTER — Other Ambulatory Visit (HOSPITAL_COMMUNITY): Payer: Self-pay

## 2021-06-01 ENCOUNTER — Telehealth: Payer: 59 | Admitting: Urgent Care

## 2021-06-01 DIAGNOSIS — B3731 Acute candidiasis of vulva and vagina: Secondary | ICD-10-CM | POA: Diagnosis not present

## 2021-06-01 MED ORDER — FLUCONAZOLE 150 MG PO TABS: ORAL_TABLET | ORAL | 0 refills | Status: DC

## 2021-06-01 NOTE — Progress Notes (Signed)
E-Visit for Vaginal Symptoms ? ?We are sorry that you are not feeling well. Here is how we plan to help! ?Based on what you shared with me it looks like you: May have a yeast vaginosis ? ?Vaginosis is an inflammation of the vagina that can result in discharge, itching and pain. The cause is usually a change in the normal balance of vaginal bacteria or an infection. Vaginosis can also result from reduced estrogen levels after menopause. ? ?The most common causes of vaginosis are: ? ? Bacterial vaginosis which results from an overgrowth of one on several organisms that are normally present in your vagina. ? ? Yeast infections which are caused by a naturally occurring fungus called candida. ? ? Vaginal atrophy (atrophic vaginosis) which results from the thinning of the vagina from reduced estrogen levels after menopause. ? ? Trichomoniasis which is caused by a parasite and is commonly transmitted by sexual intercourse. ? ?Factors that increase your risk of developing vaginosis include: ?Medications, such as antibiotics and steroids ?Uncontrolled diabetes ?Use of hygiene products such as bubble bath, vaginal spray or vaginal deodorant ?Douching ?Wearing damp or tight-fitting clothing ?Using an intrauterine device (IUD) for birth control ?Hormonal changes, such as those associated with pregnancy, birth control pills or menopause ?Sexual activity ?Having a sexually transmitted infection ? ?Your treatment plan is A single Diflucan (fluconazole) '150mg'$  tablet once.  I have electronically sent this prescription into the pharmacy that you have chosen. In severe cases, a second tablet may be required if symptoms are not resolved in 72 hours. ? ?Be sure to take all of the medication as directed. Stop taking any medication if you develop a rash, tongue swelling or shortness of breath. Mothers who are breast feeding should consider pumping and discarding their breast milk while on these antibiotics. However, there is no consensus  that infant exposure at these doses would be harmful.  ?Remember that medication creams can weaken latex condoms. ?. ? ? ?HOME CARE: ? ?Good hygiene may prevent some types of vaginosis from recurring and may relieve some symptoms: ? ?Avoid baths, hot tubs and whirlpool spas. Rinse soap from your outer genital area after a shower, and dry the area well to prevent irritation. Don't use scented or harsh soaps, such as those with deodorant or antibacterial action. ?Avoid irritants. These include scented tampons and pads. ?Wipe from front to back after using the toilet. Doing so avoids spreading fecal bacteria to your vagina. ? ?Other things that may help prevent vaginosis include: ? ?Don't douche. Your vagina doesn't require cleansing other than normal bathing. Repetitive douching disrupts the normal organisms that reside in the vagina and can actually increase your risk of vaginal infection. Douching won't clear up a vaginal infection. ?Use a latex condom. Both female and female latex condoms may help you avoid infections spread by sexual contact. ?Wear cotton underwear. Also wear pantyhose with a cotton crotch. If you feel comfortable without it, skip wearing underwear to bed. Yeast thrives in moist environments ?Your symptoms should improve in the next day or two. ? ?GET HELP RIGHT AWAY IF: ? ?You have pain in your lower abdomen ( pelvic area or over your ovaries) ?You develop nausea or vomiting ?You develop a fever ?Your discharge changes or worsens ?You have persistent pain with intercourse ?You develop shortness of breath, a rapid pulse, or you faint. ? ?These symptoms could be signs of problems or infections that need to be evaluated by a medical provider now. ? ?MAKE SURE YOU  ? ?  Understand these instructions. ?Will watch your condition. ?Will get help right away if you are not doing well or get worse. ? ?Thank you for choosing an e-visit. ? ?Your e-visit answers were reviewed by a board certified advanced  clinical practitioner to complete your personal care plan. Depending upon the condition, your plan could have included both over the counter or prescription medications. ? ?Please review your pharmacy choice. Make sure the pharmacy is open so you can pick up prescription now. If there is a problem, you may contact your provider through [employer] and have the prescription routed to another pharmacy.  Your safety is important to [place]. If you have drug allergies check your prescription carefully.  ? ?For the next 24 hours you can use MyChart to ask questions about today's visit, request a non-urgent call back, or ask for a work or school excuse. ?You will get an email in the next two days asking about your experience. I hope that your e-visit has been valuable and will speed your recovery.  ? ?I have spent 5 minutes in review of e-visit questionnaire, review and updating patient chart, medical decision making and response to patient.  ? ?Tanga Gloor L Synda Bagent, PA ? ?  ?

## 2021-06-02 ENCOUNTER — Other Ambulatory Visit (HOSPITAL_COMMUNITY): Payer: Self-pay

## 2021-06-03 ENCOUNTER — Other Ambulatory Visit (HOSPITAL_COMMUNITY): Payer: Self-pay

## 2021-06-03 DIAGNOSIS — N3946 Mixed incontinence: Secondary | ICD-10-CM | POA: Diagnosis not present

## 2021-06-03 MED ORDER — MYRBETRIQ 50 MG PO TB24
ORAL_TABLET | ORAL | 1 refills | Status: DC
  Filled 2021-06-03: qty 30, 30d supply, fill #0

## 2021-06-12 ENCOUNTER — Other Ambulatory Visit: Payer: Self-pay | Admitting: Orthopedic Surgery

## 2021-06-25 ENCOUNTER — Other Ambulatory Visit (HOSPITAL_COMMUNITY): Payer: Self-pay

## 2021-06-25 DIAGNOSIS — H0288A Meibomian gland dysfunction right eye, upper and lower eyelids: Secondary | ICD-10-CM | POA: Diagnosis not present

## 2021-06-25 DIAGNOSIS — L718 Other rosacea: Secondary | ICD-10-CM | POA: Diagnosis not present

## 2021-06-25 DIAGNOSIS — H04122 Dry eye syndrome of left lacrimal gland: Secondary | ICD-10-CM | POA: Diagnosis not present

## 2021-06-25 DIAGNOSIS — Z970 Presence of artificial eye: Secondary | ICD-10-CM | POA: Diagnosis not present

## 2021-06-25 DIAGNOSIS — H0288B Meibomian gland dysfunction left eye, upper and lower eyelids: Secondary | ICD-10-CM | POA: Diagnosis not present

## 2021-06-25 DIAGNOSIS — Z961 Presence of intraocular lens: Secondary | ICD-10-CM | POA: Diagnosis not present

## 2021-06-25 MED ORDER — CYCLOSPORINE 0.05 % OP EMUL
OPHTHALMIC | 11 refills | Status: DC
  Filled 2021-06-25: qty 60, 30d supply, fill #0
  Filled 2021-10-12: qty 60, 30d supply, fill #1
  Filled 2021-11-27: qty 60, 30d supply, fill #2
  Filled 2021-12-28: qty 60, 30d supply, fill #3
  Filled 2022-03-25: qty 60, 30d supply, fill #4
  Filled 2022-04-27: qty 60, 30d supply, fill #5

## 2021-06-26 ENCOUNTER — Other Ambulatory Visit (HOSPITAL_COMMUNITY): Payer: Self-pay

## 2021-06-26 MED ORDER — PREGABALIN 300 MG PO CAPS
300.0000 mg | ORAL_CAPSULE | Freq: Two times a day (BID) | ORAL | 3 refills | Status: DC
  Filled 2021-06-26: qty 60, 30d supply, fill #0
  Filled 2021-08-07: qty 60, 30d supply, fill #1
  Filled 2021-09-12: qty 60, 30d supply, fill #2
  Filled 2021-10-15: qty 60, 30d supply, fill #3

## 2021-06-27 NOTE — Progress Notes (Addendum)
COVID Vaccine Completed: yes x4  Date of COVID positive in last 90 days: n/a  PCP - [name], MD Cardiologist - n/a  Chest x-ray - 06/30/21 Epic EKG - 06/30/21 Epic/chart Stress Test - n/a ECHO - greater than 2 years Cardiac Cath - n/a Pacemaker/ICD device last checked: n/a Spinal Cord Stimulator: n/a  Bowel Prep - no  Sleep Study - n/a CPAP -   Fasting Blood Sugar - n/a Checks Blood Sugar _____ times a day  Blood Thinner Instructions: n/a Aspirin Instructions: Last Dose:  Activity level: Can go up a flight of stairs and perform activities of daily living without stopping and without symptoms of chest pain or shortness of breath.       Anesthesia review:   Patient denies shortness of breath, fever, cough and chest pain at PAT appointment   Patient verbalized understanding of instructions that were given to them at the PAT appointment. Patient was also instructed that they will need to review over the PAT instructions again at home before surgery.

## 2021-06-27 NOTE — Patient Instructions (Addendum)
DUE TO COVID-19 ONLY TWO VISITORS  (aged 64 and older)  ARE ALLOWED TO COME WITH YOU AND STAY IN THE WAITING ROOM ONLY DURING PRE OP AND PROCEDURE.   **NO VISITORS ARE ALLOWED IN THE SHORT STAY AREA OR RECOVERY ROOM!!**   Your procedure is scheduled on: 07/10/21   Report to [facility] Main Entrance    Report to admitting at 5:15 AM   Call this number if you have problems the morning of surgery [phone]   Do not eat food :After Midnight.   After Midnight you may have the following liquids until 4:30 AM DAY OF SURGERY  Water Black Coffee (sugar ok, NO MILK/CREAM OR CREAMERS)  Tea (sugar ok, NO MILK/CREAM OR CREAMERS) regular and decaf                             Plain Jell-O (NO RED)                                           Fruit ices (not with fruit pulp, NO RED)                                     Popsicles (NO RED)                                                                  Juice: apple, WHITE grape, WHITE cranberry Sports drinks like Gatorade (NO RED) Clear broth(vegetable,chicken,beef)     The day of surgery:  Drink ONE (1) Pre-Surgery Clear Ensure at 4:30 AM the morning of surgery. Drink in one sitting. Do not sip.  This drink was given to you during your hospital  pre-op appointment visit. Nothing else to drink after completing the  Pre-Surgery Clear Ensure.          If you have questions, please contact your surgeon's office.   FOLLOW BOWEL PREP AND ANY ADDITIONAL PRE OP INSTRUCTIONS YOU RECEIVED FROM YOUR SURGEON'S OFFICE!!!     Oral Hygiene is also important to reduce your risk of infection.                                    Remember - BRUSH YOUR TEETH THE MORNING OF SURGERY WITH YOUR REGULAR TOOTHPASTE   Take these medicines the morning of surgery with A SIP OF WATER: Zyrtec, Lyrica, Oxycodone, eye drops                              You may not have any metal on your body including hair pins, jewelry, and body piercing             Do not wear  make-up, lotions, powders, perfumes, or deodorant  Do not wear nail polish including gel and S&S, artificial/acrylic nails, or any other type of covering on natural nails including finger and toenails. If you have artificial nails, gel coating, etc.  that needs to be removed by a nail salon please have this removed prior to surgery or surgery may need to be canceled/ delayed if the surgeon/ anesthesia feels like they are unable to be safely monitored.   Do not shave  48 hours prior to surgery.    Do not bring valuables to the hospital. [place].   Contacts, dentures or bridgework may not be worn into surgery.    Patients discharged on the day of surgery will not be allowed to drive home.  Someone NEEDS to stay with you for the first 24 hours after anesthesia.   Special Instructions: Bring a copy of your healthcare power of attorney and living will documents         the day of surgery if you haven't scanned them before.              Please read over the following fact sheets you were given: IF YOU HAVE QUESTIONS ABOUT YOUR PRE-OP INSTRUCTIONS PLEASE CALL [facility] - Preparing for Surgery Before surgery, you can play an important role.  Because skin is not sterile, your skin needs to be as free of germs as possible.  You can reduce the number of germs on your skin by washing with CHG (chlorahexidine gluconate) soap before surgery.  CHG is an antiseptic cleaner which kills germs and bonds with the skin to continue killing germs even after washing. Please DO NOT use if you have an allergy to CHG or antibacterial soaps.  If your skin becomes reddened/irritated stop using the CHG and inform your nurse when you arrive at Short Stay. Do not shave (including legs and underarms) for at least 48 hours prior to the first CHG shower.  You may shave your face/neck.  Please follow these instructions carefully:  1.  Shower with CHG  Soap the night before surgery and the  morning of surgery.  2.  If you choose to wash your hair, wash your hair first as usual with your normal  shampoo.  3.  After you shampoo, rinse your hair and body thoroughly to remove the shampoo.                             4.  Use CHG as you would any other liquid soap.  You can apply chg directly to the skin and wash.  Gently with a scrungie or clean washcloth.  5.  Apply the CHG Soap to your body ONLY FROM THE NECK DOWN.   Do   not use on face/ open                           Wound or open sores. Avoid contact with eyes, ears mouth and   genitals (private parts).                       Wash face,  Genitals (private parts) with your normal soap.             6.  Wash thoroughly, paying special attention to the area where your    surgery  will be performed.  7.  Thoroughly rinse your body with warm water from the neck down.  8.  DO NOT shower/wash with your normal soap  after using and rinsing off the CHG Soap.                9.  Pat yourself dry with a clean towel.            10.  Wear clean pajamas.            11.  Place clean sheets on your bed the night of your first shower and do not  sleep with pets. Day of Surgery : Do not apply any lotions/deodorants the morning of surgery.  Please wear clean clothes to the hospital/surgery center.  FAILURE TO FOLLOW THESE INSTRUCTIONS MAY RESULT IN THE CANCELLATION OF YOUR SURGERY  PATIENT SIGNATURE_________________________________  NURSE SIGNATURE__________________________________  ________________________________________________________________________   [name]  An incentive spirometer is a tool that can help keep your lungs clear and active. This tool measures how well you are filling your lungs with each breath. Taking long deep breaths may help reverse or decrease the chance of developing breathing (pulmonary) problems (especially infection) following: A long period of time when you are  unable to move or be active. BEFORE THE PROCEDURE  If the spirometer includes an indicator to show your best effort, your nurse or respiratory therapist will set it to a desired goal. If possible, sit up straight or lean slightly forward. Try not to slouch. Hold the incentive spirometer in an upright position. INSTRUCTIONS FOR USE  Sit on the edge of your bed if possible, or sit up as far as you can in bed or on a chair. Hold the incentive spirometer in an upright position. Breathe out normally. Place the mouthpiece in your mouth and seal your lips tightly around it. Breathe in slowly and as deeply as possible, raising the piston or the ball toward the top of the column. Hold your breath for 3-5 seconds or for as long as possible. Allow the piston or ball to fall to the bottom of the column. Remove the mouthpiece from your mouth and breathe out normally. Rest for a few seconds and repeat Steps 1 through 7 at least 10 times every 1-2 hours when you are awake. Take your time and take a few normal breaths between deep breaths. The spirometer may include an indicator to show your best effort. Use the indicator as a goal to work toward during each repetition. After each set of 10 deep breaths, practice coughing to be sure your lungs are clear. If you have an incision (the cut made at the time of surgery), support your incision when coughing by placing a pillow or rolled up towels firmly against it. Once you are able to get out of bed, walk around indoors and cough well. You may stop using the incentive spirometer when instructed by your caregiver.  RISKS AND COMPLICATIONS Take your time so you do not get dizzy or light-headed. If you are in pain, you may need to take or ask for pain medication before doing incentive spirometry. It is harder to take a deep breath if you are having pain. AFTER USE Rest and breathe slowly and easily. It can be helpful to keep track of a log of your progress. Your  caregiver can provide you with a simple table to help with this. If you are using the spirometer at home, follow these instructions: [place] IF:  You are having difficultly using the spirometer. You have trouble using the spirometer as often as instructed. Your pain medication is not giving enough relief while using the  spirometer. You develop fever of 100.5 F (38.1 C) or higher. SEEK IMMEDIATE MEDICAL CARE IF:  You cough up bloody sputum that had not been present before. You develop fever of 102 F (38.9 C) or greater. You develop worsening pain at or near the incision site. MAKE SURE YOU:  Understand these instructions. Will watch your condition. Will get help right away if you are not doing well or get worse. Document Released: 05/25/2006 Document Revised: 04/06/2011 Document Reviewed: 07/26/2006 ExitCare Patient Information 2014 [employer].   ________________________________________________________________________  [facility]- Preparing for Total Shoulder Arthroplasty    Before surgery, you can play an important role. Because skin is not sterile, your skin needs to be as free of germs as possible. You can reduce the number of germs on your skin by using the following products. Benzoyl Peroxide Gel Reduces the number of germs present on the skin Applied twice a day to shoulder area starting two days before surgery    ==================================================================  Please follow these instructions carefully:  BENZOYL PEROXIDE 5% GEL  Please do not use if you have an allergy to benzoyl peroxide.   If your skin becomes reddened/irritated stop using the benzoyl peroxide.  Starting two days before surgery, apply as follows: Apply benzoyl peroxide in the morning and at night. Apply after taking a shower. If you are not taking a shower clean entire shoulder front, back, and side along with the armpit with a clean wet washcloth.  Place a  quarter-sized dollop on your shoulder and rub in thoroughly, making sure to cover the front, back, and side of your shoulder, along with the armpit.   2 days before ____ AM   ____ PM              1 day before ____ AM   ____ PM                         Do this twice a day for two days.  (Last application is the night before surgery, AFTER using the CHG soap as described below).  Do NOT apply benzoyl peroxide gel on the day of surgery.

## 2021-06-30 ENCOUNTER — Ambulatory Visit (HOSPITAL_COMMUNITY)
Admission: RE | Admit: 2021-06-30 | Discharge: 2021-06-30 | Disposition: A | Discharge status: Home / Self Care | Payer: 59 | Source: Ambulatory Visit | Attending: Orthopedic Surgery | Admitting: Orthopedic Surgery

## 2021-06-30 ENCOUNTER — Other Ambulatory Visit (HOSPITAL_COMMUNITY): Payer: Self-pay

## 2021-06-30 ENCOUNTER — Other Ambulatory Visit: Payer: Self-pay | Admitting: Family Medicine

## 2021-06-30 ENCOUNTER — Encounter (HOSPITAL_COMMUNITY)
Admission: RE | Admit: 2021-06-30 | Discharge: 2021-06-30 | Disposition: A | Discharge status: Home / Self Care | Payer: 59 | Source: Ambulatory Visit | Attending: Orthopedic Surgery | Admitting: Orthopedic Surgery

## 2021-06-30 ENCOUNTER — Encounter (HOSPITAL_COMMUNITY): Payer: Self-pay

## 2021-06-30 VITALS — BP 162/90 | HR 75 | Temp 98.5°F | Resp 16 | Ht 61.5 in | Wt 107.6 lb

## 2021-06-30 DIAGNOSIS — B182 Chronic viral hepatitis C: Secondary | ICD-10-CM | POA: Diagnosis not present

## 2021-06-30 DIAGNOSIS — Z01818 Encounter for other preprocedural examination: Secondary | ICD-10-CM | POA: Insufficient documentation

## 2021-06-30 HISTORY — DX: Inflammatory liver disease, unspecified: K75.9

## 2021-06-30 LAB — COMPREHENSIVE METABOLIC PANEL
ALT: 29 U/L (ref 0–44)
AST: 38 U/L (ref 15–41)
Albumin: 4.4 g/dL (ref 3.5–5.0)
Alkaline Phosphatase: 54 U/L (ref 38–126)
Anion gap: 6 (ref 5–15)
BUN: 14 mg/dL (ref 8–23)
CO2: 29 mmol/L (ref 22–32)
Calcium: 9.3 mg/dL (ref 8.9–10.3)
Chloride: 104 mmol/L (ref 98–111)
Creatinine, Ser: 0.53 mg/dL (ref 0.44–1.00)
GFR, Estimated: >60 mL/min (ref >60)
Glucose, Bld: 91 mg/dL (ref 70–99)
Potassium: 4.1 mmol/L (ref 3.5–5.1)
Sodium: 139 mmol/L (ref 135–145)
Total Bilirubin: 1 mg/dL (ref 0.3–1.2)
Total Protein: 7.8 g/dL (ref 6.5–8.1)

## 2021-06-30 LAB — CBC
HCT: 41.9 % (ref 36.0–46.0)
Hemoglobin: 13.9 g/dL (ref 12.0–15.0)
MCH: 33 pg (ref 26.0–34.0)
MCHC: 33.2 g/dL (ref 30.0–36.0)
MCV: 99.5 fL (ref 80.0–100.0)
Platelets: 125 10*3/uL — ABNORMAL LOW (ref 150–400)
RBC: 4.21 MIL/uL (ref 3.87–5.11)
RDW: 13.3 % (ref 11.5–15.5)
WBC: 4.6 10*3/uL (ref 4.0–10.5)
nRBC: 0 % (ref 0.0–0.2)

## 2021-06-30 LAB — SURGICAL PCR SCREEN
MRSA, PCR: NEGATIVE
Staphylococcus aureus: NEGATIVE

## 2021-06-30 NOTE — Telephone Encounter (Signed)
Pt LOV was on 05/01/2021 Last refill was done on 06/02/2021 Please advise

## 2021-07-01 ENCOUNTER — Other Ambulatory Visit (HOSPITAL_COMMUNITY): Payer: Self-pay

## 2021-07-01 MED ORDER — AMPHETAMINE-DEXTROAMPHET ER 20 MG PO CP24
20.0000 mg | ORAL_CAPSULE | ORAL | 0 refills | Status: DC
  Filled 2021-07-01: qty 30, 30d supply, fill #0

## 2021-07-01 NOTE — Telephone Encounter (Signed)
Done

## 2021-07-04 ENCOUNTER — Telehealth: Payer: 59 | Admitting: Physician Assistant

## 2021-07-04 DIAGNOSIS — B3731 Acute candidiasis of vulva and vagina: Secondary | ICD-10-CM | POA: Diagnosis not present

## 2021-07-04 MED ORDER — FLUCONAZOLE 150 MG PO TABS: ORAL_TABLET | ORAL | 0 refills | Status: DC

## 2021-07-04 NOTE — Progress Notes (Signed)

## 2021-07-09 NOTE — Anesthesia Preprocedure Evaluation (Addendum)
Anesthesia Evaluation  Patient identified by MRN, date of birth, ID band Patient awake    Reviewed: Allergy & Precautions, NPO status , Patient's Chart, lab work & pertinent test results  Airway Mallampati: II  TM Distance: >3 FB Neck ROM: Full    Dental no notable dental hx.    Pulmonary neg pulmonary ROS,    Pulmonary exam normal breath sounds clear to auscultation       Cardiovascular negative cardio ROS Normal cardiovascular exam Rhythm:Regular Rate:Normal     Neuro/Psych negative neurological ROS  negative psych ROS   GI/Hepatic negative GI ROS, (+) Hepatitis -, C  Endo/Other  negative endocrine ROS  Renal/GU negative Renal ROS  negative genitourinary   Musculoskeletal negative musculoskeletal ROS (+)   Abdominal   Peds negative pediatric ROS (+)  Hematology negative hematology ROS (+)   Anesthesia Other Findings   Reproductive/Obstetrics negative OB ROS                            Anesthesia Physical Anesthesia Plan  ASA: 2  Anesthesia Plan: General   Post-op Pain Management: Regional block*   Induction: Intravenous  PONV Risk Score and Plan: 3 and Ondansetron, Dexamethasone, Midazolam and Treatment may vary due to age or medical condition  Airway Management Planned: Oral ETT  Additional Equipment:   Intra-op Plan:   Post-operative Plan: Extubation in OR  Informed Consent: I have reviewed the patients History and Physical, chart, labs and discussed the procedure including the risks, benefits and alternatives for the proposed anesthesia with the patient or authorized representative who has indicated his/her understanding and acceptance.     Dental advisory given  Plan Discussed with: CRNA and Surgeon  Anesthesia Plan Comments:         Anesthesia Quick Evaluation

## 2021-07-10 ENCOUNTER — Other Ambulatory Visit: Payer: Self-pay

## 2021-07-10 ENCOUNTER — Other Ambulatory Visit (HOSPITAL_COMMUNITY): Payer: Self-pay

## 2021-07-10 ENCOUNTER — Ambulatory Visit (HOSPITAL_COMMUNITY): Payer: 59 | Admitting: Emergency Medicine

## 2021-07-10 ENCOUNTER — Ambulatory Visit (HOSPITAL_COMMUNITY)
Admission: RE | Admit: 2021-07-10 | Discharge: 2021-07-10 | Disposition: A | Discharge status: Home / Self Care | Payer: 59 | Attending: Orthopedic Surgery | Admitting: Orthopedic Surgery

## 2021-07-10 ENCOUNTER — Encounter (HOSPITAL_COMMUNITY): Payer: Self-pay | Admitting: Orthopedic Surgery

## 2021-07-10 ENCOUNTER — Encounter (HOSPITAL_COMMUNITY)
Admission: RE | Disposition: A | Discharge status: Home / Self Care | Payer: Self-pay | Source: Home / Self Care | Attending: Orthopedic Surgery

## 2021-07-10 ENCOUNTER — Ambulatory Visit (HOSPITAL_BASED_OUTPATIENT_CLINIC_OR_DEPARTMENT_OTHER): Payer: 59 | Admitting: Anesthesiology

## 2021-07-10 DIAGNOSIS — M19011 Primary osteoarthritis, right shoulder: Secondary | ICD-10-CM | POA: Diagnosis not present

## 2021-07-10 DIAGNOSIS — M75101 Unspecified rotator cuff tear or rupture of right shoulder, not specified as traumatic: Secondary | ICD-10-CM

## 2021-07-10 DIAGNOSIS — Z96612 Presence of left artificial shoulder joint: Secondary | ICD-10-CM | POA: Diagnosis not present

## 2021-07-10 DIAGNOSIS — B182 Chronic viral hepatitis C: Secondary | ICD-10-CM

## 2021-07-10 DIAGNOSIS — G8918 Other acute postprocedural pain: Secondary | ICD-10-CM | POA: Diagnosis not present

## 2021-07-10 DIAGNOSIS — Z01818 Encounter for other preprocedural examination: Secondary | ICD-10-CM

## 2021-07-10 HISTORY — PX: REVERSE SHOULDER ARTHROPLASTY: SHX5054

## 2021-07-10 LAB — TYPE AND SCREEN
ABO/RH(D): A NEG
Antibody Screen: NEGATIVE

## 2021-07-10 SURGERY — ARTHROPLASTY, SHOULDER, TOTAL, REVERSE
Anesthesia: General | Site: Shoulder | Laterality: Right

## 2021-07-10 MED ORDER — EPHEDRINE SULFATE-NACL 50-0.9 MG/10ML-% IV SOSY
PREFILLED_SYRINGE | INTRAVENOUS | Status: DC | PRN
  Administered 2021-07-10 (×3): 5 mg via INTRAVENOUS

## 2021-07-10 MED ORDER — DEXAMETHASONE SODIUM PHOSPHATE 10 MG/ML IJ SOLN
INTRAMUSCULAR | Status: AC
  Filled 2021-07-10: qty 1

## 2021-07-10 MED ORDER — WATER FOR IRRIGATION, STERILE IR SOLN
Status: DC | PRN
  Administered 2021-07-10: 2000 mL

## 2021-07-10 MED ORDER — ONDANSETRON HCL 4 MG/2ML IJ SOLN: 4.0000 mg | Freq: Once | INTRAMUSCULAR | Status: DC | PRN

## 2021-07-10 MED ORDER — ROCURONIUM BROMIDE 10 MG/ML (PF) SYRINGE
PREFILLED_SYRINGE | INTRAVENOUS | Status: DC | PRN
  Administered 2021-07-10: 60 mg via INTRAVENOUS

## 2021-07-10 MED ORDER — ONDANSETRON HCL 4 MG/2ML IJ SOLN
INTRAMUSCULAR | Status: AC
  Filled 2021-07-10: qty 2

## 2021-07-10 MED ORDER — MIDAZOLAM HCL 5 MG/5ML IJ SOLN
INTRAMUSCULAR | Status: DC | PRN
  Administered 2021-07-10: 2 mg via INTRAVENOUS

## 2021-07-10 MED ORDER — PHENYLEPHRINE 80 MCG/ML (10ML) SYRINGE FOR IV PUSH (FOR BLOOD PRESSURE SUPPORT)
PREFILLED_SYRINGE | INTRAVENOUS | Status: AC
  Filled 2021-07-10: qty 10

## 2021-07-10 MED ORDER — GENTAMICIN SULFATE 40 MG/ML IJ SOLN
5.0000 mg/kg | INTRAVENOUS | Status: DC
  Filled 2021-07-10: qty 6.25

## 2021-07-10 MED ORDER — GLYCOPYRROLATE 0.2 MG/ML IJ SOLN
INTRAMUSCULAR | Status: AC
  Filled 2021-07-10: qty 1

## 2021-07-10 MED ORDER — ORAL CARE MOUTH RINSE: 15.0000 mL | Freq: Once | OROMUCOSAL | Status: AC

## 2021-07-10 MED ORDER — EPHEDRINE 5 MG/ML INJ
INTRAVENOUS | Status: AC
  Filled 2021-07-10: qty 5

## 2021-07-10 MED ORDER — BUPIVACAINE HCL (PF) 0.5 % IJ SOLN
INTRAMUSCULAR | Status: DC | PRN
  Administered 2021-07-10: 15 mL via PERINEURAL

## 2021-07-10 MED ORDER — LIDOCAINE HCL (PF) 2 % IJ SOLN
INTRAMUSCULAR | Status: AC
  Filled 2021-07-10: qty 5

## 2021-07-10 MED ORDER — METHOCARBAMOL 500 MG PO TABS
500.0000 mg | ORAL_TABLET | Freq: Three times a day (TID) | ORAL | 0 refills | Status: DC | PRN
  Filled 2021-07-10: qty 30, 10d supply, fill #0

## 2021-07-10 MED ORDER — GLYCOPYRROLATE 0.2 MG/ML IJ SOLN
INTRAMUSCULAR | Status: DC | PRN
  Administered 2021-07-10: .2 mg via INTRAVENOUS

## 2021-07-10 MED ORDER — FENTANYL CITRATE (PF) 100 MCG/2ML IJ SOLN
INTRAMUSCULAR | Status: DC | PRN
Start: 2021-07-10 — End: 2021-07-10
  Administered 2021-07-10: 25 ug via INTRAVENOUS

## 2021-07-10 MED ORDER — LACTATED RINGERS IV SOLN: INTRAVENOUS | Status: DC

## 2021-07-10 MED ORDER — SUGAMMADEX SODIUM 200 MG/2ML IV SOLN
INTRAVENOUS | Status: DC | PRN
  Administered 2021-07-10: 200 mg via INTRAVENOUS

## 2021-07-10 MED ORDER — PROPOFOL 10 MG/ML IV BOLUS
INTRAVENOUS | Status: AC
  Filled 2021-07-10: qty 20

## 2021-07-10 MED ORDER — MIDAZOLAM HCL 2 MG/2ML IJ SOLN
INTRAMUSCULAR | Status: AC
Start: 2021-07-10 — End: ?
  Filled 2021-07-10: qty 2

## 2021-07-10 MED ORDER — ACETAMINOPHEN 10 MG/ML IV SOLN: 1000.0000 mg | Freq: Once | INTRAVENOUS | Status: DC | PRN

## 2021-07-10 MED ORDER — ROCURONIUM BROMIDE 10 MG/ML (PF) SYRINGE
PREFILLED_SYRINGE | INTRAVENOUS | Status: AC
  Filled 2021-07-10: qty 10

## 2021-07-10 MED ORDER — SODIUM CHLORIDE 0.9 % IR SOLN
Status: DC | PRN
  Administered 2021-07-10: 1000 mL

## 2021-07-10 MED ORDER — CHLORHEXIDINE GLUCONATE 0.12 % MT SOLN
15.0000 mL | Freq: Once | OROMUCOSAL | Status: AC
  Administered 2021-07-10: 15 mL via OROMUCOSAL

## 2021-07-10 MED ORDER — METHOCARBAMOL 500 MG PO TABS: 500.0000 mg | ORAL_TABLET | Freq: Four times a day (QID) | ORAL | Status: DC | PRN

## 2021-07-10 MED ORDER — BUPIVACAINE LIPOSOME 1.3 % IJ SUSP
INTRAMUSCULAR | Status: DC | PRN
  Administered 2021-07-10: 10 mL via PERINEURAL

## 2021-07-10 MED ORDER — CLINDAMYCIN PHOSPHATE 600 MG/50ML IV SOLN
600.0000 mg | INTRAVENOUS | Status: DC
  Filled 2021-07-10: qty 50

## 2021-07-10 MED ORDER — CEFAZOLIN SODIUM-DEXTROSE 2-4 GM/100ML-% IV SOLN
2.0000 g | INTRAVENOUS | Status: AC
  Administered 2021-07-10: 2 g via INTRAVENOUS
  Filled 2021-07-10: qty 100

## 2021-07-10 MED ORDER — FENTANYL CITRATE (PF) 100 MCG/2ML IJ SOLN
INTRAMUSCULAR | Status: AC
  Filled 2021-07-10: qty 2

## 2021-07-10 MED ORDER — OXYCODONE HCL 5 MG/5ML PO SOLN: 5.0000 mg | Freq: Once | ORAL | Status: DC | PRN

## 2021-07-10 MED ORDER — ONDANSETRON HCL 4 MG/2ML IJ SOLN
INTRAMUSCULAR | Status: AC
Start: 2021-07-10 — End: ?
  Filled 2021-07-10: qty 2

## 2021-07-10 MED ORDER — OXYCODONE HCL 5 MG PO TABS: 5.0000 mg | ORAL_TABLET | Freq: Once | ORAL | Status: DC | PRN

## 2021-07-10 MED ORDER — PHENYLEPHRINE HCL-NACL 20-0.9 MG/250ML-% IV SOLN
INTRAVENOUS | Status: AC
  Filled 2021-07-10: qty 750

## 2021-07-10 MED ORDER — METHOCARBAMOL 500 MG IVPB - SIMPLE MED
500.0000 mg | Freq: Four times a day (QID) | INTRAVENOUS | Status: DC | PRN
  Filled 2021-07-10: qty 50

## 2021-07-10 MED ORDER — 0.9 % SODIUM CHLORIDE (POUR BTL) OPTIME
TOPICAL | Status: DC | PRN
  Administered 2021-07-10: 1000 mL

## 2021-07-10 MED ORDER — PROPOFOL 10 MG/ML IV BOLUS
INTRAVENOUS | Status: DC | PRN
  Administered 2021-07-10: 70 mg via INTRAVENOUS

## 2021-07-10 MED ORDER — TRANEXAMIC ACID-NACL 1000-0.7 MG/100ML-% IV SOLN
1000.0000 mg | INTRAVENOUS | Status: AC
  Administered 2021-07-10: 1000 mg via INTRAVENOUS
  Filled 2021-07-10: qty 100

## 2021-07-10 MED ORDER — LIDOCAINE 2% (20 MG/ML) 5 ML SYRINGE
INTRAMUSCULAR | Status: DC | PRN
  Administered 2021-07-10: 100 mg via INTRAVENOUS

## 2021-07-10 MED ORDER — ONDANSETRON HCL 4 MG/2ML IJ SOLN
INTRAMUSCULAR | Status: DC | PRN
  Administered 2021-07-10: 4 mg via INTRAVENOUS

## 2021-07-10 MED ORDER — DEXAMETHASONE SODIUM PHOSPHATE 4 MG/ML IJ SOLN
INTRAMUSCULAR | Status: DC | PRN
  Administered 2021-07-10: 10 mg via INTRAVENOUS

## 2021-07-10 MED ORDER — PHENYLEPHRINE HCL-NACL 20-0.9 MG/250ML-% IV SOLN
INTRAVENOUS | Status: DC | PRN
  Administered 2021-07-10: 20 ug/min via INTRAVENOUS

## 2021-07-10 MED ORDER — FENTANYL CITRATE PF 50 MCG/ML IJ SOSY: 25.0000 ug | PREFILLED_SYRINGE | INTRAMUSCULAR | Status: DC | PRN

## 2021-07-10 SURGICAL SUPPLY — 83 items
AID PSTN UNV HD RSTRNT DISP (MISCELLANEOUS) ×1
BAG COUNTER SPONGE SURGICOUNT (BAG) IMPLANT
BAG SPNG CNTER NS LX DISP (BAG)
BAG ZIPLOCK 12X15 (MISCELLANEOUS) ×2 IMPLANT
BASEPLATE P2 COATD GLND 6.5X30 (Shoulder) IMPLANT
BIT DRILL 1.6MX128 (BIT) IMPLANT
BIT DRILL 2.5 DIA 127 CALI (BIT) ×1 IMPLANT
BIT DRILL 4 DIA CALIBRATED (BIT) ×1 IMPLANT
BLADE SAW SGTL 73X25 THK (BLADE) ×2 IMPLANT
BOOTIES KNEE HIGH SLOAN (MISCELLANEOUS) ×4 IMPLANT
BSPLAT GLND 30 STRL LF SHLDR (Shoulder) ×1 IMPLANT
CLSR STERI-STRIP ANTIMIC 1/2X4 (GAUZE/BANDAGES/DRESSINGS) ×1 IMPLANT
COOLER ICEMAN CLASSIC (MISCELLANEOUS) ×1 IMPLANT
COVER BACK TABLE 60X90IN (DRAPES) ×2 IMPLANT
COVER SURGICAL LIGHT HANDLE (MISCELLANEOUS) ×2 IMPLANT
DRAPE INCISE IOBAN 66X45 STRL (DRAPES) ×2 IMPLANT
DRAPE ORTHO SPLIT 77X108 STRL (DRAPES) ×4
DRAPE POUCH INSTRU U-SHP 10X18 (DRAPES) ×2 IMPLANT
DRAPE SHEET LG 3/4 BI-LAMINATE (DRAPES) ×2 IMPLANT
DRAPE SURG 17X11 SM STRL (DRAPES) ×2 IMPLANT
DRAPE SURG ORHT 6 SPLT 77X108 (DRAPES) ×2 IMPLANT
DRAPE TOP 10253 STERILE (DRAPES) ×2 IMPLANT
DRAPE U-SHAPE 47X51 STRL (DRAPES) ×2 IMPLANT
DRSG AQUACEL AG ADV 3.5X 6 (GAUZE/BANDAGES/DRESSINGS) ×2 IMPLANT
DRSG AQUACEL AG ADV 3.5X10 (GAUZE/BANDAGES/DRESSINGS) ×1 IMPLANT
DURAPREP 26ML APPLICATOR (WOUND CARE) ×4 IMPLANT
ELECT BLADE TIP CTD 4 INCH (ELECTRODE) ×2 IMPLANT
ELECT REM PT RETURN 15FT ADLT (MISCELLANEOUS) ×2 IMPLANT
FACESHIELD WRAPAROUND (MASK) ×2 IMPLANT
FACESHIELD WRAPAROUND OR TEAM (MASK) ×1 IMPLANT
GLOVE BIO SURGEON STRL SZ7.5 (GLOVE) ×2 IMPLANT
GLOVE BIOGEL PI IND STRL 6.5 (GLOVE) ×1 IMPLANT
GLOVE BIOGEL PI IND STRL 8 (GLOVE) ×1 IMPLANT
GLOVE BIOGEL PI INDICATOR 6.5 (GLOVE) ×1
GLOVE BIOGEL PI INDICATOR 8 (GLOVE) ×1
GLOVE SURG SS PI 6.5 STRL IVOR (GLOVE) ×2 IMPLANT
GOWN STRL REUS W/ TWL LRG LVL3 (GOWN DISPOSABLE) ×1 IMPLANT
GOWN STRL REUS W/ TWL XL LVL3 (GOWN DISPOSABLE) ×1 IMPLANT
GOWN STRL REUS W/TWL LRG LVL3 (GOWN DISPOSABLE) ×2
GOWN STRL REUS W/TWL XL LVL3 (GOWN DISPOSABLE) ×2
HANDPIECE INTERPULSE COAX TIP (DISPOSABLE) ×2
HOOD PEEL AWAY FLYTE STAYCOOL (MISCELLANEOUS) ×6 IMPLANT
HUMERA STEM SM SHELL SHOU 10 (Miscellaneous) ×2 IMPLANT
INSERT SMALL SOCKET 32MM NEU (Insert) ×1 IMPLANT
KIT BASIN OR (CUSTOM PROCEDURE TRAY) ×2 IMPLANT
KIT TURNOVER KIT A (KITS) IMPLANT
MANIFOLD NEPTUNE II (INSTRUMENTS) ×2 IMPLANT
NDL TROCAR POINT SZ 2 1/2 (NEEDLE) IMPLANT
NEEDLE TROCAR POINT SZ 2 1/2 (NEEDLE) IMPLANT
NS IRRIG 1000ML POUR BTL (IV SOLUTION) ×2 IMPLANT
P2 COATDE GLNOID BSEPLT 6.5X30 (Shoulder) ×2 IMPLANT
PACK SHOULDER (CUSTOM PROCEDURE TRAY) ×2 IMPLANT
PAD COLD SHLDR WRAP-ON (PAD) ×1 IMPLANT
PROTECTOR NERVE ULNAR (MISCELLANEOUS) IMPLANT
RESTRAINT HEAD UNIVERSAL NS (MISCELLANEOUS) ×2 IMPLANT
RETRIEVER SUT HEWSON (MISCELLANEOUS) IMPLANT
SCREW BONE LOCKING RSP 5.0X14 (Screw) ×4 IMPLANT
SCREW BONE RSP LOCK 5X14 (Screw) IMPLANT
SCREW BONE RSP LOCK 5X22 (Screw) IMPLANT
SCREW BONE RSP LOCK 5X26 (Screw) IMPLANT
SCREW BONE RSP LOCKING 5.0X26 (Screw) ×2 IMPLANT
SCREW BONE RSP LOCKING 5.0X32 (Screw) ×2 IMPLANT
SCREW RETAIN W/HEAD 4MM OFFSET (Shoulder) ×1 IMPLANT
SET HNDPC FAN SPRY TIP SCT (DISPOSABLE) ×1 IMPLANT
SLING ARM IMMOBILIZER LRG (SOFTGOODS) IMPLANT
SLING ARM IMMOBILIZER MED (SOFTGOODS) ×1 IMPLANT
STEM HUMERAL SM SHELL SHOU 10 (Miscellaneous) IMPLANT
STRIP CLOSURE SKIN 1/2X4 (GAUZE/BANDAGES/DRESSINGS) ×4 IMPLANT
SUCTION FRAZIER HANDLE 10FR (MISCELLANEOUS)
SUCTION TUBE FRAZIER 10FR DISP (MISCELLANEOUS) IMPLANT
SUPPORT WRAP ARM LG (MISCELLANEOUS) IMPLANT
SUT ETHIBOND 2 V 37 (SUTURE) ×1 IMPLANT
SUT FIBERWIRE #2 38 REV NDL BL (SUTURE) ×2
SUT MNCRL AB 4-0 PS2 18 (SUTURE) ×2 IMPLANT
SUT VIC AB 2-0 CT1 27 (SUTURE) ×4
SUT VIC AB 2-0 CT1 TAPERPNT 27 (SUTURE) ×2 IMPLANT
SUTURE FIBERWR#2 38 REV NDL BL (SUTURE) IMPLANT
TAPE LABRALWHITE 1.5X36 (TAPE) IMPLANT
TAPE SUT LABRALTAP WHT/BLK (SUTURE) IMPLANT
TOWEL OR 17X26 10 PK STRL BLUE (TOWEL DISPOSABLE) ×2 IMPLANT
TOWEL OR NON WOVEN STRL DISP B (DISPOSABLE) ×2 IMPLANT
WATER STERILE IRR 1000ML POUR (IV SOLUTION) ×2 IMPLANT
WRAP KNEE MAXI GEL POST OP (GAUZE/BANDAGES/DRESSINGS) ×1 IMPLANT

## 2021-07-10 NOTE — Discharge Instructions (Addendum)
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 You are to wear this at all times (except for bathing and dressing), until your first post operative visit with Dr. Tamera Punt. Please also wear while sleeping at night. While you bath and dress, let the arm/elbow extend straight down to stretch your elbow. Wiggle your fingers and pump your first while your in the sling to prevent hand swelling. Use ice on the shoulder intermittently over the first 48 hours after surgery. Continue to use ice or and ice machine as needed after 48 hours for pain control/swelling.  You may take your oxycodone '10mg'$  up to every 6 hrs as needed for pain. Another pain prescription will not be sent in.  You may take Extra Strength Tylenol or Tylenol. DO NOT take ANY nonsteroidal anti-inflammatory pain medications: Advil, Motrin, Ibuprofen, Aleve, Naproxen or Naprosyn.  Take one aspirin a day for 2 weeks after surgery, unless you have an aspirin sensitivity/allergy or asthma.  Leave your dressing on until your first follow up visit.  You may shower with the dressing.  Hold your arm as if you still have your sling on while you shower. Simply allow the water to wash over the site and then pat dry. Make sure your axilla (armpit) is completely dry after showering.    Please call [phone] during normal business hours or [phone] after hours for any problems. Including the following:  - excessive redness of the incisions - drainage for more than 4 days - fever of more than 101.5 F  *Please note that pain medications will not be refilled after hours or on weekends.    Dental Antibiotics:  In most cases prophylactic antibiotics for Dental procdeures after total joint surgery are not necessary.  Exceptions are as follows:  1. History of prior total joint infection  2. Severely immunocompromised (Organ Transplant, cancer chemotherapy, Rheumatoid biologic meds such  as Thoreau)  3. Poorly controlled diabetes (A1C &gt; 8.0, blood glucose over 200)  If you have one of these conditions, contact your surgeon for an antibiotic prescription, prior to your dental procedure.

## 2021-07-10 NOTE — Anesthesia Procedure Notes (Signed)
Anesthesia Procedure Image    

## 2021-07-10 NOTE — Anesthesia Procedure Notes (Signed)
Anesthesia Regional Block: Interscalene brachial plexus block   Pre-Anesthetic Checklist: , timeout performed,  Correct Patient, Correct Site, Correct Laterality,  Correct Procedure, Correct Position, site marked,  Risks and benefits discussed,  Surgical consent,  Pre-op evaluation,  At surgeon's request and post-op pain management  Laterality: Right  Prep: chloraprep       Needles:  Injection technique: Single-shot  Needle Type: Echogenic Needle     Needle Length: 9cm      Additional Needles:   Procedures:,,,, ultrasound used (permanent image in chart),,    Narrative:  Start time: 07/10/2021 6:54 AM End time: 07/10/2021 7:04 AM Injection made incrementally with aspirations every 5 mL.  Performed by: Personally  Anesthesiologist: Myrtie Soman, MD  Additional Notes: Patient tolerated the procedure well without complications

## 2021-07-10 NOTE — Op Note (Signed)
Procedure(s): REVERSE SHOULDER ARTHROPLASTY Procedure Note  Reianna Batdorf female 66 y.o. 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 female  With endstage right shoulder arthritis with rotator cuff tear. Pain and dysfunction interfered with quality of life and nonoperative treatment with activity modification, NSAIDS and injections failed.     Surgeon: Rhae Hammock   Assistants: Sheryle Hail PA-C Amber was present and scrubbed throughout the procedure and was essential in positioning, retraction, exposure, and closure)  Anesthesia: General endotracheal anesthesia with preoperative interscalene block given by the attending anesthesiologist   Procedure Detail  REVERSE SHOULDER ARTHROPLASTY   Estimated Blood Loss:  200 mL         Drains: none  Blood Given: none          Specimens: none        Complications:  * No complications entered in OR log *         Disposition: PACU - hemodynamically stable.         Condition: stable      OPERATIVE FINDINGS:  A DJO Altivate pressfit reverse total shoulder arthroplasty was placed with a  size 10 stem, a 32-4 glenosphere, and a standard-mm poly insert. The base plate  fixation was excellent.  PROCEDURE: The patient was identified in the preoperative holding area  where I personally marked the operative site after verifying site, side,  and procedure with the patient. An interscalene block given by  the attending anesthesiologist in the holding area and the patient was taken back to the operating room where all extremities were  carefully padded in position after general anesthesia was induced. She  was placed in a beach-chair position and the operative upper extremity was  prepped and draped in a standard sterile fashion.  An approximately 10-  cm incision was made from the tip of the coracoid process to the center  point of the humerus at the level of the axilla. Dissection was carried  down through subcutaneous tissues to the level of the cephalic vein  which was taken laterally with the deltoid. The pectoralis major was  retracted medially. The subdeltoid space was developed and the lateral  edge of the conjoined tendon was identified. The undersurface of  conjoined tendon was palpated and the musculocutaneous nerve was not in  the field. Retractor was placed underneath the conjoined and second  retractor was placed lateral into the deltoid. The circumflex humeral  artery and vessels were identified and clamped and coagulated. The  biceps tendon was tenodesed to the upper border of the pectoralis major.  The subscapularis was taken down as a peel with the underlying capsule.  The  joint was then gently externally rotated while the capsule was released  from the humeral neck around to just beyond the 6 o'clock position. At  this point, the joint was dislocated and the humeral head was presented  into the wound. The excessive osteophyte formation was removed with a  large rongeur.  The cutting guide was used to make the appropriate  head cut and the head was saved for potentially bone grafting.  The glenoid was exposed with the arm in an  abducted extended position. The anterior and posterior labrum were  completely excised and the capsule was released circumferentially to  allow for exposure of the glenoid for preparation. The  2.5 mm drill was  placed using the guide in 5-10 inferior angulation and the tap was then advanced in the same hole. Small and large reamers were then used. The tap was then removed and the Metaglene was then screwed in with excellent purchase.  The peripheral guide was then used to drilled measured and filled peripheral locking screws. The size 32-4 glenosphere was then impacted on the  [facility] taper and the central screw was placed. The humerus was then again exposed and the diaphyseal reamers were used followed by the metaphyseal reamers. The final broach was left in place in the proximal trial was placed. The joint was reduced and with this implant it was felt that soft tissue tensioning was appropriate with excellent stability and excellent range of motion. Therefore, final humeral stem was placed press-fit.  And then the trial polyethylene inserts were tested again and the above implant was felt to be the most appropriate for final insertion. The joint was reduced taken through full range of motion and felt to be stable. Soft tissue tension was appropriate.  The joint was then copiously irrigated with pulse  lavage and the wound was then closed. The subscapularis was repaired with a FiberWire through bone tunnels.  Skin was closed with 2-0 Vicryl in a deep dermal layer and 4-0  Monocryl for skin closure. Steri-Strips were applied. Sterile  dressings were then applied as well as a sling. The patient was allowed  to awaken from general anesthesia, transferred to stretcher, and taken  to recovery room in stable condition.   POSTOPERATIVE PLAN: The patient will be observed in the recovery room and if her pain is well controlled with her regional anesthesia and she is hemodynamically stable she can be discharged home today with family.

## 2021-07-10 NOTE — Anesthesia Procedure Notes (Signed)
Procedure Name: Intubation Date/Time: 07/10/2021 7:29 AM  Performed by: Claudia Desanctis, CRNAPre-anesthesia Checklist: Patient identified, Emergency Drugs available, Suction available and Patient being monitored Patient Re-evaluated:Patient Re-evaluated prior to induction Oxygen Delivery Method: Circle system utilized Preoxygenation: Pre-oxygenation with 100% oxygen Induction Type: IV induction Ventilation: Mask ventilation without difficulty Laryngoscope Size: 2 and Miller Grade View: Grade I Tube type: Oral Number of attempts: 1 Airway Equipment and Method: Stylet Placement Confirmation: ETT inserted through vocal cords under direct vision, positive ETCO2 and breath sounds checked- equal and bilateral Tube secured with: Tape Dental Injury: Teeth and Oropharynx as per pre-operative assessment

## 2021-07-10 NOTE — Evaluation (Signed)
Occupational Therapy Evaluation Patient Details Name: [name] MRN: [id number] DOB: 1955-11-02 Today's Date: 07/10/2021   History of Present Illness Patietn s/p Right Reverse shoulder arthroplasty   Clinical Impression   [name] is a 66 year old woman s/p shoulder replacement without functional use of right dominant upper extremity secondary to effects of surgery and interscalene block and shoulder precautions. Therapist provided education and instruction to patient in regards to exercises, precautions, positioning, donning upper extremity clothing and bathing while maintaining shoulder precautions, ice and edema management and donning/doffing sling. Patient verbalized understanding and handouts provided to maximize retention of education. Patient has her mother's PCA to assist her at discharge (pre-planned). Patient to follow up with MD for further therapy needs.        Recommendations for follow up therapy are one component of a multi-disciplinary discharge planning process, led by the attending physician.  Recommendations may be updated based on patient status, additional functional criteria and insurance authorization.   Follow Up Recommendations  Follow physician's recommendations for discharge plan and follow up therapies    Assistance Recommended at Discharge Intermittent Supervision/Assistance  Patient can return home with the following A little help with walking and/or transfers;Assistance with cooking/housework    Functional Status Assessment  Patient has had a recent decline in their functional status and demonstrates the ability to make significant improvements in function in a reasonable and predictable amount of time.  Equipment Recommendations  None recommended by OT    Recommendations for Other Services       Precautions / Restrictions Precautions Precautions: Shoulder Type of Shoulder Precautions: No AROM, No PROM Shoulder Interventions:  Shoulder sling/immobilizer;Off for dressing/bathing/exercises Precaution Booklet Issued:  (handouts) Required Braces or Orthoses: Sling Restrictions Weight Bearing Restrictions: Yes RUE Weight Bearing: Non weight bearing      Mobility Bed Mobility                    Transfers                          Balance Overall balance assessment: No apparent balance deficits (not formally assessed)                                         ADL either performed or assessed with clinical judgement   ADL                                               Vision Patient Visual Report: No change from baseline       Perception     Praxis      Pertinent Vitals/Pain Pain Assessment Pain Assessment: No/denies pain     Hand Dominance     Extremity/Trunk Assessment Upper Extremity Assessment Upper Extremity Assessment: RUE deficits/detail RUE Deficits / Details: impaired motor control and sensation secondary to effects of interscalene block   Lower Extremity Assessment Lower Extremity Assessment: Overall WFL for tasks assessed   Cervical / Trunk Assessment Cervical / Trunk Assessment: Normal   Communication     Cognition Arousal/Alertness: Awake/alert Behavior During Therapy: WFL for tasks assessed/performed Overall Cognitive Status: Within Functional Limits for tasks assessed  General Comments       Exercises     Shoulder Instructions Shoulder Instructions Donning/doffing shirt without moving shoulder: Patient able to independently direct caregiver Method for sponge bathing under operated UE: Independent Donning/doffing sling/immobilizer: Independent Correct positioning of sling/immobilizer: Independent Pendulum exercises (written home exercise program):  (NA) ROM for elbow, wrist and digits of operated UE: Independent Sling wearing schedule (on at all times/off for  ADL's): Independent Proper positioning of operated UE when showering: Independent Dressing change: Independent Positioning of UE while sleeping: [place] expects to be discharged to:: Private residence Living Arrangements: Parent Available Help at Discharge: Personal care attendant (Has a PCA that will be assisting her)                                    Prior Functioning/Environment                          OT Problem List: Decreased strength;Decreased range of motion;Impaired UE functional use;Pain      OT Treatment/Interventions:      OT Goals(Current goals can be found in the care plan section) Acute Rehab OT Goals OT Goal Formulation: All assessment and education complete, DC therapy  OT Frequency:      Co-evaluation              AM-PAC OT "6 Clicks" Daily Activity     Outcome Measure Help from another person eating meals?: A Little Help from another person taking care of personal grooming?: None Help from another person toileting, which includes using toliet, bedpan, or urinal?: None Help from another person bathing (including washing, rinsing, drying)?: None Help from another person to put on and taking off regular upper body clothing?: A Little Help from another person to put on and taking off regular lower body clothing?: None 6 Click Score: 22   End of Session Nurse Communication: Mobility status  Activity Tolerance: Patient tolerated treatment well Patient left: in chair  OT Visit Diagnosis: Muscle weakness (generalized) (M62.81)                Time: 1540-0867 OT Time Calculation (min): 18 min Charges:  OT General Charges $OT Visit: 1 Visit OT Evaluation $OT Eval Low Complexity: 1 Low  [name], OTR/L [place]  Office [phone] Pager: [phone]   [name] 07/10/2021, 10:59 AM

## 2021-07-10 NOTE — H&P (Addendum)
[name] is an 66 y.o. female.   Chief Complaint: R shoulder pain and dsyfunction HPI: Endstage R shoulder arthritis with rotator cuff disease with significant pain and dysfunction, failed conservative measures.  Pain interferes with sleep and quality of life.   Past Medical History:  Diagnosis Date   ARDS (adult respiratory distress syndrome) ([place]) 2007   Arthritis    Depression    Gallstones 01/07/2018   Ganglion cyst of dorsum of right wrist    Headache    Hepatitis    MRSA (methicillin resistant Staphylococcus aureus) 2007   Osteoporosis    Pneumonia 2009   Prosthetic eye globe    right eye    Raynaud's disease     Past Surgical History:  Procedure Laterality Date   ABDOMINAL HYSTERECTOMY     ANTERIOR CERVICAL DECOMP/DISCECTOMY FUSION N/A 12/12/2014   Procedure: ANTERIOR CERVICAL DECOMPRESSION/DISCECTOMY FUSION 3 LEVELS;  Surgeon: Phylliss Bob, MD;  Location: [facility];  Service: Orthopedics;  Laterality: N/A;  Anterior cervical decompression fusion, cerivcal 5-6, cervical 6-7, cervical 7-thoracic 1 with instrumentation and allografrt   APPENDECTOMY  1974   CATARACT EXTRACTION Left 11/2018   CHOLECYSTECTOMY N/A 01/07/2018   Procedure: LAPAROSCOPIC CHOLECYSTECTOMY WITH INTRAOPERATIVE CHOLANGIOGRAM;  Surgeon: Fanny Skates, MD;  Location: [facility];  Service: General;  Laterality: N/A;   COLONOSCOPY  04/14/2013   per Dr. Ardis Hughs, clear, repeat in 10 yrs    CORNEAL TRANSPLANT Right 2007   x4   ENUCLEATION Right 2013   eye infection   GANGLION CYST EXCISION Right 05/25/2016   Procedure: RIGHT WRIST ULNAR GANGLION EXCISION;  Surgeon: Milly Jakob, MD;  Location: [facility];  Service: Orthopedics;  Laterality: Right;   RHINOPLASTY  1980   ROBOTIC ASSISTED LAPAROSCOPIC SACROCOLPOPEXY Bilateral 06/13/2020   Procedure: XI ROBOTIC ASSISTED LAPAROSCOPIC SACROCOLPOPEXY AND SUPRACERVICAL HYSTERECTOMY AND BILATERAL  SALPINGO OOPHERECTOMY;  Surgeon: Ardis Hughs, MD;  Location: [facility];  Service: Urology;  Laterality: Bilateral;   TONSILLECTOMY     removed in 3rd grade   TOTAL SHOULDER ARTHROPLASTY Left 03/13/2019   Procedure: TOTAL SHOULDER ARTHROPLASTY;  Surgeon: Tania Ade, MD;  Location: [facility];  Service: Orthopedics;  Laterality: Left;    Family History  Problem Relation Age of Onset   Breast cancer Maternal Aunt 60   Colon cancer Neg Hx    Social History:  reports that she has never smoked. She has never used smokeless tobacco. She reports current alcohol use. She reports that she does not use drugs.  Allergies: No Known Allergies  BMI: Estimated body mass index is 20 kg/m as calculated from the following:   Height as of this encounter: 5' 1.5" (1.562 m).   Weight as of this encounter: 48.8 kg.  Lab Results  Component Value Date   ALBUMIN 4.4 06/30/2021   Diabetes: Patient does not have a diagnosis of diabetes.     Smoking Status:   reports that she has never smoked. She has never used smokeless tobacco.    Medications Prior to Admission  Medication Sig Dispense Refill   amphetamine-dextroamphetamine (ADDERALL XR) 20 MG 24 hr capsule Take 1 capsule by mouth every morning. 30 capsule 0   Carboxymethylcellul-Glycerin (REFRESH OPTIVE PF OP) Apply 3-4 drops to eye 2 (two) times daily as needed (dry eyes).     celecoxib (CELEBREX) 200 MG capsule Take 1 capsule (200 mg total) by mouth 2 (two) times daily. 180 capsule 3   cetirizine (ZYRTEC) 10 MG tablet Take 10  mg by mouth daily.     Oxycodone HCl 10 MG TABS Take 1 tablet (10 mg total) by mouth every 6 (six) hours as needed (pain). (Patient taking differently: Take 10 mg by mouth in the morning and at bedtime.) 120 tablet 0   pregabalin (LYRICA) 300 MG capsule Take 1 capsule (300 mg total) by mouth 2 (two) times daily. 60 capsule 3   verapamil (CALAN-SR) 120 MG CR tablet TAKE 1 TABLET BY MOUTH AT BEDTIME (Patient taking differently: Take 120 mg by  mouth at bedtime.) 90 tablet 0   conjugated estrogens (PREMARIN) vaginal cream Use small bead on tip of finger and swab inside of vagina every other day. (Patient taking differently: Place 1 Applicatorful vaginally See admin instructions. 1 application every 3 days) 42.5 g PRN   COVID-19 mRNA bivalent vaccine, Pfizer, injection Inject into the muscle. 0.3 mL 0   cycloSPORINE (RESTASIS) 0.05 % ophthalmic emulsion Place 1 drop into both eyes 2 times daily. (Patient taking differently: Place 1 drop into both eyes 2 (two) times daily.) 60 each 11   doxycycline (MONODOX) 50 MG capsule Take 50 mg by mouth daily. (Patient not taking: Reported on 06/30/2021)     fluconazole (DIFLUCAN) 150 MG tablet Take one tab PO in a single dose now. May repeat in 72 hours if needed 2 tablet 0   influenza vac split quadrivalent PF (FLUARIX) 0.5 ML injection Inject into the muscle. 0.5 mL 0   mirabegron ER (MYRBETRIQ) 50 MG TB24 tablet Take 1 tablet by mouth daily. (Patient not taking: Reported on 06/30/2021) 30 tablet 1   Oxymetazoline HCl (NASAL SPRAY) 0.05 % SOLN Place 2 sprays into the nose daily as needed (congestion).     polyethylene glycol powder (GLYCOLAX/MIRALAX) powder TAKE 17 GRAMS BY MOUTH 2 TIMES DAILY AS NEEDED. (Patient taking differently: Take 17 g by mouth See admin instructions. 17 g once every 3 days) 3162 g 1   valACYclovir (VALTREX) 1000 MG tablet Take 1 tablet by mouth once daily. 90 tablet 3    No results found for this or any previous visit (from the past 48 hour(s)). No results found.  Review of Systems  All other systems reviewed and are negative.   Blood pressure 113/73, pulse 60, temperature 97.6 F (36.4 C), temperature source Oral, resp. rate 10, height 5' 1.5" (1.562 m), weight 48.8 kg, SpO2 94 %. Physical Exam HENT:     Head: Atraumatic.  Eyes:     Extraocular Movements: Extraocular movements intact.  Cardiovascular:     Pulses: Normal pulses.  Pulmonary:     Effort: Pulmonary  effort is normal.  Musculoskeletal:     Comments: R shoulder pain with limited ROM> NVID.  Skin:    General: Skin is warm.  Neurological:     Mental Status: She is alert.      Assessment/Plan R shoulder endstage arthritis with rotator cuff disease Plan R reverse TSA Risks / benefits of surgery discussed Consent on chart  NPO for OR Preop antibiotics   Rhae Hammock, MD 07/10/2021, 7:12 AM

## 2021-07-10 NOTE — Anesthesia Postprocedure Evaluation (Signed)
Anesthesia Post Note  Patient: [name]  Procedure(s) Performed: REVERSE SHOULDER ARTHROPLASTY (Right: Shoulder)     Patient location during evaluation: PACU Anesthesia Type: General Level of consciousness: awake and alert Pain management: pain level controlled Vital Signs Assessment: post-procedure vital signs reviewed and stable Respiratory status: spontaneous breathing, nonlabored ventilation, respiratory function stable and patient connected to nasal cannula oxygen Cardiovascular status: blood pressure returned to baseline and stable Postop Assessment: no apparent nausea or vomiting Anesthetic complications: no   No notable events documented.  Last Vitals:  Vitals:   07/10/21 0915 07/10/21 0930  BP: 102/62   Pulse: 68 69  Resp: 15 15  Temp:    SpO2: 97% 92%    Last Pain:  Vitals:   07/10/21 0930  TempSrc:   PainSc: 0-No pain                 Ramadan Couey S

## 2021-07-10 NOTE — Transfer of Care (Signed)
Immediate Anesthesia Transfer of Care Note  Patient: [name]  Procedure(s) Performed: REVERSE SHOULDER ARTHROPLASTY (Right: Shoulder)  Patient Location: PACU  Anesthesia Type:General  Level of Consciousness: drowsy  Airway & Oxygen Therapy: Patient Spontanous Breathing and Patient connected to face mask oxygen  Post-op Assessment: Report given to RN and Post -op Vital signs reviewed and stable  Post vital signs: Reviewed and stable  Last Vitals:  Vitals Value Taken Time  BP 114/63 07/10/21 0854  Temp    Pulse 62 07/10/21 0855  Resp 32 07/10/21 0855  SpO2 100 % 07/10/21 0855  Vitals shown include unvalidated device data.  Last Pain:  Vitals:   07/10/21 0557  TempSrc: Oral  PainSc:       Patients Stated Pain Goal: 4 (50/75/73 2256)  Complications: No notable events documented.

## 2021-07-11 ENCOUNTER — Encounter (HOSPITAL_COMMUNITY): Payer: Self-pay | Admitting: Orthopedic Surgery

## 2021-07-25 DIAGNOSIS — Z96611 Presence of right artificial shoulder joint: Secondary | ICD-10-CM | POA: Diagnosis not present

## 2021-07-25 DIAGNOSIS — Z471 Aftercare following joint replacement surgery: Secondary | ICD-10-CM | POA: Diagnosis not present

## 2021-07-29 ENCOUNTER — Other Ambulatory Visit: Payer: Self-pay | Admitting: Family Medicine

## 2021-07-30 ENCOUNTER — Other Ambulatory Visit (HOSPITAL_COMMUNITY): Payer: Self-pay

## 2021-07-30 MED ORDER — AMPHETAMINE-DEXTROAMPHET ER 20 MG PO CP24
20.0000 mg | ORAL_CAPSULE | ORAL | 0 refills | Status: DC
Start: 2021-07-30 — End: 2021-08-14
  Filled 2021-07-30: qty 30, 30d supply, fill #0

## 2021-07-30 MED ORDER — OXYCODONE HCL 10 MG PO TABS
10.0000 mg | ORAL_TABLET | Freq: Four times a day (QID) | ORAL | 0 refills | Status: DC | PRN
  Filled 2021-07-30 – 2021-08-01 (×2): qty 120, 30d supply, fill #0

## 2021-07-30 NOTE — Telephone Encounter (Signed)
Pt LOV was on 05/01/2021 Last refill done on 07/01/2021 Please advise

## 2021-07-30 NOTE — Telephone Encounter (Signed)
Pt LOV was on 02/03/2021 Last refill was done on 07/06/2022 Please advise

## 2021-07-30 NOTE — Telephone Encounter (Signed)
Done

## 2021-08-01 ENCOUNTER — Other Ambulatory Visit (HOSPITAL_COMMUNITY): Payer: Self-pay

## 2021-08-07 ENCOUNTER — Other Ambulatory Visit (HOSPITAL_COMMUNITY): Payer: Self-pay

## 2021-08-14 ENCOUNTER — Ambulatory Visit: Payer: 59 | Admitting: Family Medicine

## 2021-08-14 VITALS — BP 128/80 | HR 83 | Temp 97.8°F | Wt 109.2 lb

## 2021-08-14 DIAGNOSIS — J019 Acute sinusitis, unspecified: Secondary | ICD-10-CM

## 2021-08-14 DIAGNOSIS — M2550 Pain in unspecified joint: Secondary | ICD-10-CM

## 2021-08-14 DIAGNOSIS — F1190 Opioid use, unspecified, uncomplicated: Secondary | ICD-10-CM

## 2021-08-14 MED ORDER — AMPHETAMINE-DEXTROAMPHET ER 20 MG PO CP24: 20.0000 mg | ORAL_CAPSULE | ORAL | 0 refills | Status: DC

## 2021-08-14 MED ORDER — OXYCODONE HCL 10 MG PO TABS: 10.0000 mg | ORAL_TABLET | Freq: Four times a day (QID) | ORAL | 0 refills | Status: DC | PRN

## 2021-08-14 MED ORDER — AZITHROMYCIN 250 MG PO TABS: ORAL_TABLET | ORAL | 0 refills | Status: DC

## 2021-08-14 NOTE — Progress Notes (Signed)
   Subjective:    Patient ID: [name], female    DOB: [birth year], 66 y.o.   MRN: [id number]  HPI Here for pain management. She had a right shoulder arthroplasty surgery on 07-10-21, and this went well. They are giving this 5-6 weeks to heal, and then she will begin PT in earnest. Overall her pain levels are about the same. In addition she has had sinus congestion, PND, and a cough that produces green mucus for the past week. No fever or SOB.    Review of Systems  Constitutional: Negative.   HENT:  Positive for congestion, postnasal drip and sinus pressure. Negative for ear pain and sore throat.   Eyes: Negative.   Respiratory:  Positive for cough. Negative for shortness of breath and wheezing.   Musculoskeletal:  Positive for arthralgias.       Objective:   Physical Exam Constitutional:      Appearance: Normal appearance. She is not ill-appearing.  Cardiovascular:     Rate and Rhythm: Normal rate and regular rhythm.     Pulses: Normal pulses.     Heart sounds: Normal heart sounds.  Pulmonary:     Effort: Pulmonary effort is normal.     Breath sounds: Normal breath sounds.  Neurological:     Mental Status: She is alert.           Assessment & Plan:  Pain management.  Indication for chronic opioid: OA Medication and dose: Oxycoodone 20 mg  # pills per month: 120 Last UDS date: 05-01-21 Opioid Treatment Agreement signed (Y/N): 04-30-17 Opioid Treatment Agreement last reviewed with patient:n 08-14-21 [facility] reviewed this encounter (include red flags):  08-14-21 Meds were refilled.  She also has a sinusitis, and we will treat this with a Zpack.  [name], MD

## 2021-08-20 DIAGNOSIS — Z9889 Other specified postprocedural states: Secondary | ICD-10-CM | POA: Diagnosis not present

## 2021-08-20 DIAGNOSIS — Z96611 Presence of right artificial shoulder joint: Secondary | ICD-10-CM | POA: Diagnosis not present

## 2021-08-27 ENCOUNTER — Other Ambulatory Visit (HOSPITAL_COMMUNITY): Payer: Self-pay

## 2021-08-27 ENCOUNTER — Other Ambulatory Visit: Payer: Self-pay | Admitting: Family Medicine

## 2021-08-27 ENCOUNTER — Encounter: Payer: Self-pay | Admitting: Family Medicine

## 2021-08-27 MED ORDER — AMPHETAMINE-DEXTROAMPHET ER 20 MG PO CP24
ORAL_CAPSULE | ORAL | 0 refills | Status: DC
  Filled 2021-08-27 (×3): qty 30, 30d supply, fill #0
  Filled 2021-08-27: qty 30, fill #0
  Filled 2021-08-30: qty 30, 30d supply, fill #0

## 2021-08-29 ENCOUNTER — Other Ambulatory Visit (HOSPITAL_COMMUNITY): Payer: Self-pay

## 2021-08-29 MED ORDER — CELECOXIB 200 MG PO CAPS
200.0000 mg | ORAL_CAPSULE | Freq: Two times a day (BID) | ORAL | 3 refills | Status: DC
  Filled 2021-08-29: qty 180, 90d supply, fill #0
  Filled 2021-12-23: qty 180, 90d supply, fill #1

## 2021-08-29 NOTE — Telephone Encounter (Signed)
Done

## 2021-08-30 ENCOUNTER — Other Ambulatory Visit (HOSPITAL_COMMUNITY): Payer: Self-pay

## 2021-09-01 DIAGNOSIS — N390 Urinary tract infection, site not specified: Secondary | ICD-10-CM | POA: Diagnosis not present

## 2021-09-01 DIAGNOSIS — N3946 Mixed incontinence: Secondary | ICD-10-CM | POA: Diagnosis not present

## 2021-09-01 DIAGNOSIS — N762 Acute vulvitis: Secondary | ICD-10-CM | POA: Diagnosis not present

## 2021-09-01 DIAGNOSIS — B9620 Unspecified Escherichia coli [E. coli] as the cause of diseases classified elsewhere: Secondary | ICD-10-CM | POA: Diagnosis not present

## 2021-09-03 DIAGNOSIS — M25511 Pain in right shoulder: Secondary | ICD-10-CM | POA: Diagnosis not present

## 2021-09-04 ENCOUNTER — Other Ambulatory Visit (HOSPITAL_COMMUNITY): Payer: Self-pay

## 2021-09-04 ENCOUNTER — Other Ambulatory Visit: Payer: Self-pay | Admitting: Orthopedic Surgery

## 2021-09-04 DIAGNOSIS — M25511 Pain in right shoulder: Secondary | ICD-10-CM

## 2021-09-04 MED ORDER — NITROFURANTOIN MACROCRYSTAL 100 MG PO CAPS
ORAL_CAPSULE | ORAL | 0 refills | Status: DC
  Filled 2021-09-04: qty 14, 7d supply, fill #0

## 2021-09-12 ENCOUNTER — Other Ambulatory Visit (HOSPITAL_COMMUNITY): Payer: Self-pay

## 2021-09-12 MED ORDER — CEPHALEXIN 500 MG PO CAPS
ORAL_CAPSULE | ORAL | 0 refills | Status: DC
Start: 2021-09-12 — End: 2021-09-17
  Filled 2021-09-12: qty 21, 7d supply, fill #0

## 2021-09-15 ENCOUNTER — Ambulatory Visit
Admission: RE | Admit: 2021-09-15 | Discharge: 2021-09-15 | Disposition: A | Discharge status: Home / Self Care | Payer: 59 | Source: Ambulatory Visit | Attending: Orthopedic Surgery | Admitting: Orthopedic Surgery

## 2021-09-15 DIAGNOSIS — S42121A Displaced fracture of acromial process, right shoulder, initial encounter for closed fracture: Secondary | ICD-10-CM | POA: Diagnosis not present

## 2021-09-15 DIAGNOSIS — M25511 Pain in right shoulder: Secondary | ICD-10-CM

## 2021-09-17 ENCOUNTER — Other Ambulatory Visit (HOSPITAL_COMMUNITY): Payer: Self-pay

## 2021-09-17 DIAGNOSIS — N393 Stress incontinence (female) (male): Secondary | ICD-10-CM | POA: Diagnosis not present

## 2021-09-17 MED ORDER — CEPHALEXIN 500 MG PO CAPS
ORAL_CAPSULE | ORAL | 0 refills | Status: DC
  Filled 2021-09-17: qty 12, 3d supply, fill #0

## 2021-09-17 MED ORDER — TRAMADOL HCL 50 MG PO TABS
ORAL_TABLET | ORAL | 0 refills | Status: DC
  Filled 2021-09-17: qty 15, 2d supply, fill #0

## 2021-09-22 DIAGNOSIS — M84311A Stress fracture, right shoulder, initial encounter for fracture: Secondary | ICD-10-CM | POA: Diagnosis not present

## 2021-09-24 DIAGNOSIS — Z970 Presence of artificial eye: Secondary | ICD-10-CM | POA: Diagnosis not present

## 2021-09-30 ENCOUNTER — Other Ambulatory Visit (HOSPITAL_COMMUNITY): Payer: Self-pay

## 2021-09-30 DIAGNOSIS — N3946 Mixed incontinence: Secondary | ICD-10-CM | POA: Diagnosis not present

## 2021-09-30 MED ORDER — PHENAZOPYRIDINE HCL 200 MG PO TABS
200.0000 mg | ORAL_TABLET | Freq: Three times a day (TID) | ORAL | 1 refills | Status: DC | PRN
  Filled 2021-09-30: qty 30, 10d supply, fill #0

## 2021-09-30 MED ORDER — AMPHETAMINE-DEXTROAMPHET ER 20 MG PO CP24
ORAL_CAPSULE | ORAL | 0 refills | Status: DC
  Filled 2021-09-30: qty 30, 30d supply, fill #0

## 2021-10-01 ENCOUNTER — Other Ambulatory Visit (HOSPITAL_COMMUNITY): Payer: Self-pay

## 2021-10-01 MED ORDER — ESTRADIOL 0.1 MG/GM VA CREA
TOPICAL_CREAM | VAGINAL | 3 refills | Status: AC
  Filled 2021-10-01: qty 42.5, 90d supply, fill #0
  Filled 2021-12-28: qty 42.5, 90d supply, fill #1
  Filled 2022-03-25: qty 42.5, 90d supply, fill #2

## 2021-10-08 DIAGNOSIS — L718 Other rosacea: Secondary | ICD-10-CM | POA: Diagnosis not present

## 2021-10-08 DIAGNOSIS — H0288A Meibomian gland dysfunction right eye, upper and lower eyelids: Secondary | ICD-10-CM | POA: Diagnosis not present

## 2021-10-08 DIAGNOSIS — Z970 Presence of artificial eye: Secondary | ICD-10-CM | POA: Diagnosis not present

## 2021-10-08 DIAGNOSIS — H0288B Meibomian gland dysfunction left eye, upper and lower eyelids: Secondary | ICD-10-CM | POA: Diagnosis not present

## 2021-10-08 DIAGNOSIS — H04122 Dry eye syndrome of left lacrimal gland: Secondary | ICD-10-CM | POA: Diagnosis not present

## 2021-10-08 DIAGNOSIS — Z961 Presence of intraocular lens: Secondary | ICD-10-CM | POA: Diagnosis not present

## 2021-10-12 ENCOUNTER — Other Ambulatory Visit (HOSPITAL_COMMUNITY): Payer: Self-pay

## 2021-10-13 ENCOUNTER — Other Ambulatory Visit (HOSPITAL_COMMUNITY): Payer: Self-pay

## 2021-10-13 DIAGNOSIS — M25511 Pain in right shoulder: Secondary | ICD-10-CM | POA: Diagnosis not present

## 2021-10-15 ENCOUNTER — Encounter: Payer: Self-pay | Admitting: Family Medicine

## 2021-10-15 ENCOUNTER — Other Ambulatory Visit (HOSPITAL_COMMUNITY): Payer: Self-pay

## 2021-10-15 ENCOUNTER — Telehealth (INDEPENDENT_AMBULATORY_CARE_PROVIDER_SITE_OTHER): Payer: 59 | Admitting: Family Medicine

## 2021-10-15 VITALS — Temp 97.1°F

## 2021-10-15 DIAGNOSIS — J019 Acute sinusitis, unspecified: Secondary | ICD-10-CM

## 2021-10-15 MED ORDER — AZITHROMYCIN 250 MG PO TABS: ORAL_TABLET | ORAL | 0 refills | Status: DC

## 2021-10-15 NOTE — Progress Notes (Signed)
   Subjective:    Patient ID: [name], female    DOB: 16-Sep-1955, 66 y.o.   MRN: [id number]  HPI Virtual Visit via Telephone Note  I connected with the patient on 10/15/21 at  3:00 PM EDT by telephone and verified that I am speaking with the correct person using two identifiers.   I discussed the limitations, risks, security and privacy concerns of performing an evaluation and management service by telephone and the availability of in person appointments. I also discussed with the patient that there may be a patient responsible charge related to this service. The patient expressed understanding and agreed to proceed.  Location patient: home Location provider: work or home office Participants present for the call: patient, provider Patient did not have a visit in the prior 7 days to address this/these issue(s).   History of Present Illness: Here for 2 week sof stuffy head, PND, and coughing up green sputum. No fever or SOB. Taking Zyrtec and Guaifenesin. She tested negative for the Covid virus this morning.   Observations/Objective: Her voice is very hoarse on the phone. I do not appreciate any SOB. Speech and thought processing are grossly intact. Patient reported vitals:  Assessment and Plan: Sinusitis, treat with a Zpack. Recheck as needed. [name], MD   Follow Up Instructions:     [phone] 11-20 [phone] 21-30 I did not refer this patient for an OV in the next 24 hours for this/these issue(s).  I discussed the assessment and treatment plan with the patient. The patient was provided an opportunity to ask questions and all were answered. The patient agreed with the plan and demonstrated an understanding of the instructions.   The patient was advised to call back or seek an in-person evaluation if the symptoms worsen or if the condition fails to improve as anticipated.  I provided 12 minutes of non-face-to-face time during this  encounter.   [name], MD     Review of Systems     Objective:   Physical Exam        Assessment & Plan:

## 2021-10-16 ENCOUNTER — Other Ambulatory Visit (HOSPITAL_COMMUNITY): Payer: Self-pay

## 2021-10-20 ENCOUNTER — Other Ambulatory Visit: Payer: Self-pay | Admitting: Family Medicine

## 2021-10-21 ENCOUNTER — Other Ambulatory Visit (HOSPITAL_COMMUNITY): Payer: Self-pay

## 2021-10-21 MED ORDER — VERAPAMIL HCL ER 120 MG PO TBCR
120.0000 mg | EXTENDED_RELEASE_TABLET | Freq: Every day | ORAL | 0 refills | Status: DC
  Filled 2021-10-21: qty 90, 90d supply, fill #0

## 2021-10-30 ENCOUNTER — Other Ambulatory Visit: Payer: Self-pay | Admitting: Family Medicine

## 2021-10-30 ENCOUNTER — Encounter: Payer: Self-pay | Admitting: Family Medicine

## 2021-10-30 ENCOUNTER — Other Ambulatory Visit (HOSPITAL_COMMUNITY): Payer: Self-pay

## 2021-10-30 NOTE — Telephone Encounter (Signed)
Last VV-10/15/21 Last refill-09-30-21  Next OV- 11/03/21 [name] currently no refills on file

## 2021-10-31 ENCOUNTER — Other Ambulatory Visit: Payer: Self-pay | Admitting: Family Medicine

## 2021-10-31 ENCOUNTER — Other Ambulatory Visit (HOSPITAL_COMMUNITY): Payer: Self-pay

## 2021-10-31 MED ORDER — AMPHETAMINE-DEXTROAMPHET ER 20 MG PO CP24
20.0000 mg | ORAL_CAPSULE | ORAL | 0 refills | Status: DC
  Filled 2021-10-31: qty 30, 30d supply, fill #0

## 2021-10-31 NOTE — Telephone Encounter (Signed)
Which med is she referring to?

## 2021-10-31 NOTE — Telephone Encounter (Signed)
Done

## 2021-11-03 ENCOUNTER — Ambulatory Visit: Payer: 59 | Admitting: Family Medicine

## 2021-11-06 DIAGNOSIS — M25551 Pain in right hip: Secondary | ICD-10-CM | POA: Diagnosis not present

## 2021-11-06 DIAGNOSIS — M5136 Other intervertebral disc degeneration, lumbar region: Secondary | ICD-10-CM | POA: Diagnosis not present

## 2021-11-06 DIAGNOSIS — M1611 Unilateral primary osteoarthritis, right hip: Secondary | ICD-10-CM | POA: Diagnosis not present

## 2021-11-12 ENCOUNTER — Other Ambulatory Visit: Payer: Self-pay | Admitting: Family Medicine

## 2021-11-13 ENCOUNTER — Other Ambulatory Visit (HOSPITAL_COMMUNITY): Payer: Self-pay

## 2021-11-13 MED ORDER — PREGABALIN 300 MG PO CAPS
300.0000 mg | ORAL_CAPSULE | Freq: Two times a day (BID) | ORAL | 5 refills | Status: DC
  Filled 2021-11-13: qty 60, 30d supply, fill #0
  Filled 2021-12-20: qty 60, 30d supply, fill #1
  Filled 2022-01-23: qty 60, 30d supply, fill #2
  Filled 2022-02-23 – 2022-02-25 (×2): qty 60, 30d supply, fill #3
  Filled 2022-03-25: qty 60, 30d supply, fill #4
  Filled 2022-04-27: qty 60, 30d supply, fill #5

## 2021-11-13 NOTE — Telephone Encounter (Signed)
Pt LOV was on 10/15/21 Last refill was done on 05/01/2021 Please advise

## 2021-11-14 ENCOUNTER — Other Ambulatory Visit (HOSPITAL_COMMUNITY): Payer: Self-pay

## 2021-11-17 ENCOUNTER — Other Ambulatory Visit (HOSPITAL_COMMUNITY): Payer: Self-pay

## 2021-11-19 ENCOUNTER — Ambulatory Visit: Payer: 59 | Admitting: Family Medicine

## 2021-11-19 ENCOUNTER — Encounter: Payer: Self-pay | Admitting: Family Medicine

## 2021-11-19 VITALS — BP 124/86 | HR 74 | Temp 98.2°F | Wt 111.1 lb

## 2021-11-19 DIAGNOSIS — F1190 Opioid use, unspecified, uncomplicated: Secondary | ICD-10-CM

## 2021-11-19 DIAGNOSIS — Z23 Encounter for immunization: Secondary | ICD-10-CM | POA: Diagnosis not present

## 2021-11-19 DIAGNOSIS — M2550 Pain in unspecified joint: Secondary | ICD-10-CM | POA: Diagnosis not present

## 2021-11-19 MED ORDER — OXYCODONE HCL 20 MG PO TABS: 20.0000 mg | ORAL_TABLET | Freq: Four times a day (QID) | ORAL | 0 refills | Status: DC | PRN

## 2021-11-19 MED ORDER — AMPHETAMINE-DEXTROAMPHET ER 20 MG PO CP24: 20.0000 mg | ORAL_CAPSULE | ORAL | 0 refills | Status: DC

## 2021-11-19 MED ORDER — OXYCODONE HCL 20 MG PO TABS: 20.0000 mg | ORAL_TABLET | Freq: Four times a day (QID) | ORAL | 0 refills | Status: AC | PRN

## 2021-11-19 NOTE — Progress Notes (Signed)
   Subjective:    Patient ID: [name], female    DOB: October 11, 1955, 66 y.o.   MRN: [id number]  HPI Here for pain management. She has been recovering from right shoulder replacement surgery but she recently developed a new severe kind of pain in the shoulder. Xrays per her surgeon, Dr. Tamera Punt, revealed a small stress fracture in the scapula. She had to put her rehab on hold and she has been resting the arm as much as possible.    Review of Systems  Constitutional: Negative.   Musculoskeletal:  Positive for arthralgias.       Objective:   Physical Exam Constitutional:      Appearance: Normal appearance.  Neurological:     Mental Status: She is alert.           Assessment & Plan:  Pain management. Indication for chronic opioid: OA Medication and dose: Oxycodone 20 mg  # pills per month: 120 Last UDS date: 05-01-21 Opioid Treatment Agreement signed (Y/N): 04-30-17 Opioid Treatment Agreement last reviewed with patient:  10-25 23 [facility] reviewed this encounter (include red flags): Yes Due to the stress fracture we will increase the Oxycodone from 10 mg to 20 mg, but hopefully we can bring the dose back down after a few months.  [name], MD

## 2021-11-21 ENCOUNTER — Other Ambulatory Visit (HOSPITAL_COMMUNITY): Payer: Self-pay

## 2021-11-25 ENCOUNTER — Other Ambulatory Visit (HOSPITAL_COMMUNITY): Payer: Self-pay

## 2021-11-25 DIAGNOSIS — N393 Stress incontinence (female) (male): Secondary | ICD-10-CM | POA: Diagnosis not present

## 2021-11-25 MED ORDER — AMPHETAMINE-DEXTROAMPHET ER 20 MG PO CP24
20.0000 mg | ORAL_CAPSULE | ORAL | 0 refills | Status: DC
  Filled 2021-11-29: qty 30, 30d supply, fill #0
  Filled ????-??-??: fill #0

## 2021-11-26 DIAGNOSIS — M25511 Pain in right shoulder: Secondary | ICD-10-CM | POA: Diagnosis not present

## 2021-11-27 ENCOUNTER — Other Ambulatory Visit (HOSPITAL_COMMUNITY): Payer: Self-pay

## 2021-11-29 ENCOUNTER — Other Ambulatory Visit (HOSPITAL_COMMUNITY): Payer: Self-pay

## 2021-12-09 ENCOUNTER — Ambulatory Visit: Payer: 59 | Admitting: Family Medicine

## 2021-12-09 ENCOUNTER — Encounter: Payer: Self-pay | Admitting: Family Medicine

## 2021-12-09 VITALS — BP 120/80 | HR 66 | Wt 112.0 lb

## 2021-12-09 DIAGNOSIS — B029 Zoster without complications: Secondary | ICD-10-CM | POA: Diagnosis not present

## 2021-12-09 MED ORDER — METHYLPREDNISOLONE 4 MG PO TBPK: ORAL_TABLET | ORAL | 0 refills | Status: DC

## 2021-12-09 MED ORDER — VALACYCLOVIR HCL 1 G PO TABS: 1000.0000 mg | ORAL_TABLET | Freq: Three times a day (TID) | ORAL | 0 refills | Status: DC

## 2021-12-09 NOTE — Progress Notes (Signed)
   Subjective:    Patient ID: [name], female    DOB: [birth year], 66 y.o.   MRN: [id number]  HPI Here for a severe burning pain in the left lower back and left flank which started a week ago. Her skin is extra sensitive and clothing rubbing against her skin is painful. No rashes. She takes Valtrex 1000 mg daily for prophylaxis. She has never had the shingles vaccine.   Review of Systems  Constitutional: Negative.   Respiratory: Negative.    Cardiovascular: Negative.   Musculoskeletal:  Positive for back pain.       Objective:   Physical Exam Constitutional:      Appearance: Normal appearance. She is not ill-appearing.  Cardiovascular:     Rate and Rhythm: Normal rate and regular rhythm.     Pulses: Normal pulses.     Heart sounds: Normal heart sounds.  Pulmonary:     Effort: Pulmonary effort is normal.     Breath sounds: Normal breath sounds.  Musculoskeletal:     Comments: She is not tender in the left lower back but her skin is very sensitive to light touch   Skin:    Findings: No erythema or rash.  Neurological:     Mental Status: She is alert.           Assessment & Plan:  This is consistent with shingles. We wil treat with 10 days of Valtrex 1000 mg TID and a Medrol dose pack. Recheck as needed.  [name], MD

## 2021-12-20 ENCOUNTER — Other Ambulatory Visit (HOSPITAL_COMMUNITY): Payer: Self-pay

## 2021-12-24 ENCOUNTER — Other Ambulatory Visit (HOSPITAL_COMMUNITY): Payer: Self-pay

## 2021-12-28 ENCOUNTER — Other Ambulatory Visit (HOSPITAL_COMMUNITY): Payer: Self-pay

## 2021-12-29 ENCOUNTER — Other Ambulatory Visit (HOSPITAL_COMMUNITY): Payer: Self-pay

## 2021-12-29 MED ORDER — AMPHETAMINE-DEXTROAMPHET ER 20 MG PO CP24
20.0000 mg | ORAL_CAPSULE | Freq: Every morning | ORAL | 0 refills | Status: DC
Start: 2021-11-19 — End: 2022-01-28
  Filled 2021-12-29: qty 30, 30d supply, fill #0

## 2021-12-30 ENCOUNTER — Other Ambulatory Visit (HOSPITAL_COMMUNITY): Payer: Self-pay

## 2022-01-07 DIAGNOSIS — Z970 Presence of artificial eye: Secondary | ICD-10-CM | POA: Diagnosis not present

## 2022-01-07 DIAGNOSIS — H0288A Meibomian gland dysfunction right eye, upper and lower eyelids: Secondary | ICD-10-CM | POA: Diagnosis not present

## 2022-01-07 DIAGNOSIS — H04122 Dry eye syndrome of left lacrimal gland: Secondary | ICD-10-CM | POA: Diagnosis not present

## 2022-01-07 DIAGNOSIS — Z961 Presence of intraocular lens: Secondary | ICD-10-CM | POA: Diagnosis not present

## 2022-01-07 DIAGNOSIS — H0288B Meibomian gland dysfunction left eye, upper and lower eyelids: Secondary | ICD-10-CM | POA: Diagnosis not present

## 2022-01-07 DIAGNOSIS — L718 Other rosacea: Secondary | ICD-10-CM | POA: Diagnosis not present

## 2022-01-08 ENCOUNTER — Other Ambulatory Visit (HOSPITAL_BASED_OUTPATIENT_CLINIC_OR_DEPARTMENT_OTHER): Payer: Self-pay

## 2022-01-08 MED ORDER — COMIRNATY 30 MCG/0.3ML IM SUSY
PREFILLED_SYRINGE | INTRAMUSCULAR | 0 refills | Status: DC
  Filled 2022-01-08: qty 0.3, 1d supply, fill #0

## 2022-01-20 DIAGNOSIS — J9811 Atelectasis: Secondary | ICD-10-CM | POA: Diagnosis not present

## 2022-01-20 DIAGNOSIS — S4992XA Unspecified injury of left shoulder and upper arm, initial encounter: Secondary | ICD-10-CM | POA: Diagnosis not present

## 2022-01-20 DIAGNOSIS — S42022A Displaced fracture of shaft of left clavicle, initial encounter for closed fracture: Secondary | ICD-10-CM | POA: Diagnosis not present

## 2022-01-20 DIAGNOSIS — S199XXA Unspecified injury of neck, initial encounter: Secondary | ICD-10-CM | POA: Diagnosis not present

## 2022-01-20 DIAGNOSIS — S0990XA Unspecified injury of head, initial encounter: Secondary | ICD-10-CM | POA: Diagnosis not present

## 2022-01-20 DIAGNOSIS — S42002A Fracture of unspecified part of left clavicle, initial encounter for closed fracture: Secondary | ICD-10-CM | POA: Diagnosis not present

## 2022-01-21 DIAGNOSIS — S82851A Displaced trimalleolar fracture of right lower leg, initial encounter for closed fracture: Secondary | ICD-10-CM | POA: Diagnosis not present

## 2022-01-23 ENCOUNTER — Other Ambulatory Visit (HOSPITAL_COMMUNITY): Payer: Self-pay

## 2022-01-23 ENCOUNTER — Other Ambulatory Visit: Payer: Self-pay

## 2022-01-23 DIAGNOSIS — S82851A Displaced trimalleolar fracture of right lower leg, initial encounter for closed fracture: Secondary | ICD-10-CM | POA: Diagnosis not present

## 2022-01-23 DIAGNOSIS — S42002A Fracture of unspecified part of left clavicle, initial encounter for closed fracture: Secondary | ICD-10-CM | POA: Diagnosis not present

## 2022-01-28 ENCOUNTER — Other Ambulatory Visit: Payer: Self-pay

## 2022-01-28 ENCOUNTER — Other Ambulatory Visit (HOSPITAL_COMMUNITY): Payer: Self-pay

## 2022-01-28 DIAGNOSIS — M25571 Pain in right ankle and joints of right foot: Secondary | ICD-10-CM | POA: Diagnosis not present

## 2022-01-28 DIAGNOSIS — M25512 Pain in left shoulder: Secondary | ICD-10-CM | POA: Diagnosis not present

## 2022-01-28 MED ORDER — AMPHETAMINE-DEXTROAMPHET ER 20 MG PO CP24
20.0000 mg | ORAL_CAPSULE | Freq: Every morning | ORAL | 0 refills | Status: DC
  Filled 2022-01-28: qty 30, 30d supply, fill #0

## 2022-01-30 ENCOUNTER — Encounter (HOSPITAL_COMMUNITY): Payer: Self-pay

## 2022-01-30 ENCOUNTER — Other Ambulatory Visit: Payer: Self-pay | Admitting: Orthopaedic Surgery

## 2022-01-30 ENCOUNTER — Other Ambulatory Visit: Payer: Self-pay | Admitting: Orthopedic Surgery

## 2022-01-30 ENCOUNTER — Other Ambulatory Visit (HOSPITAL_COMMUNITY): Payer: Self-pay

## 2022-01-30 DIAGNOSIS — M25571 Pain in right ankle and joints of right foot: Secondary | ICD-10-CM

## 2022-01-31 ENCOUNTER — Other Ambulatory Visit (HOSPITAL_COMMUNITY): Payer: Self-pay

## 2022-02-02 ENCOUNTER — Other Ambulatory Visit: Payer: Self-pay

## 2022-02-02 ENCOUNTER — Ambulatory Visit
Admission: RE | Admit: 2022-02-02 | Discharge: 2022-02-02 | Disposition: A | Discharge status: Home / Self Care | Payer: Commercial Managed Care - PPO | Source: Ambulatory Visit | Attending: Orthopaedic Surgery | Admitting: Orthopaedic Surgery

## 2022-02-02 ENCOUNTER — Encounter (HOSPITAL_COMMUNITY): Payer: Self-pay | Admitting: Orthopaedic Surgery

## 2022-02-02 ENCOUNTER — Other Ambulatory Visit (HOSPITAL_COMMUNITY): Payer: Self-pay

## 2022-02-02 DIAGNOSIS — M7989 Other specified soft tissue disorders: Secondary | ICD-10-CM | POA: Diagnosis not present

## 2022-02-02 DIAGNOSIS — M25571 Pain in right ankle and joints of right foot: Secondary | ICD-10-CM

## 2022-02-02 NOTE — Progress Notes (Signed)
PCP - Dr. Orbie Pyo  Chest x-ray - 06/30/21 EKG - 06/30/21  ERAS Protcol - Clears until 0915  Anesthesia review: Y  Patient verbally denies any shortness of breath, fever, cough and chest pain during phone call   -------------  SDW INSTRUCTIONS given:  Your procedure is scheduled on 02/03/22.  Report to [facility] Main Entrance "A" at [place] and check in at the Admitting office.  Call this number if you have problems the morning of surgery:  [phone]   Remember:  Do not eat after midnight the night before your surgery  You may drink clear liquids until 0915 the morning of your surgery.   Clear liquids allowed are: Water, Non-Citrus Juices (without pulp), Carbonated Beverages, Clear Tea, Black Coffee Only, and Gatorade    Take these medicines the morning of surgery with A SIP OF WATER  cetirizine (ZYRTEC)  RESTASIS  pregabalin (LYRICA) PERCOCET REFRESH-if needed Oxymetazoline HCl (NASAL SPRAY)-if needed  As of today, STOP taking any Aspirin (unless otherwise instructed by your surgeon) Aleve, Naproxen, Ibuprofen, Motrin, Advil, Goody's, BC's, all herbal medications, fish oil, and all vitamins.                      Do not wear jewelry, make up, or nail polish            Do not wear lotions, powders, perfumes/colognes, or deodorant.            Do not shave 48 hours prior to surgery.  Men may shave face and neck.            Do not bring valuables to the hospital.            [facility] is not responsible for any belongings or valuables.  Do NOT Smoke (Tobacco/Vaping) 24 hours prior to your procedure If you use a CPAP at night, you may bring all equipment for your overnight stay.   Contacts, glasses, dentures or bridgework may not be worn into surgery.      For patients admitted to the hospital, discharge time will be determined by your treatment team.   Patients discharged the day of surgery will not be allowed to drive home, and someone needs to stay with them  for 24 hours.    Special instructions:   [facility]- Preparing For Surgery  Before surgery, you can play an important role. Because skin is not sterile, your skin needs to be as free of germs as possible. You can reduce the number of germs on your skin by washing with CHG (chlorahexidine gluconate) Soap before surgery.  CHG is an antiseptic cleaner which kills germs and bonds with the skin to continue killing germs even after washing.    Oral Hygiene is also important to reduce your risk of infection.  Remember - BRUSH YOUR TEETH THE MORNING OF SURGERY WITH YOUR REGULAR TOOTHPASTE  Please do not use if you have an allergy to CHG or antibacterial soaps. If your skin becomes reddened/irritated stop using the CHG.  Do not shave (including legs and underarms) for at least 48 hours prior to first CHG shower. It is OK to shave your face.  Please follow these instructions carefully.   Shower the NIGHT BEFORE SURGERY and the MORNING OF SURGERY with DIAL Soap.   Pat yourself dry with a CLEAN TOWEL.  Wear CLEAN PAJAMAS to bed the night before surgery  Place CLEAN SHEETS on your bed the night of your first shower  and DO NOT SLEEP WITH PETS.   Day of Surgery: Please shower morning of surgery  Wear Clean/Comfortable clothing the morning of surgery Do not apply any deodorants/lotions.   Remember to brush your teeth WITH YOUR REGULAR TOOTHPASTE.   Questions were answered. Patient verbalized understanding of instructions.

## 2022-02-03 ENCOUNTER — Other Ambulatory Visit: Payer: Self-pay

## 2022-02-03 ENCOUNTER — Ambulatory Visit (HOSPITAL_BASED_OUTPATIENT_CLINIC_OR_DEPARTMENT_OTHER): Payer: Commercial Managed Care - PPO | Admitting: Physician Assistant

## 2022-02-03 ENCOUNTER — Encounter (HOSPITAL_COMMUNITY): Payer: Self-pay | Admitting: Orthopaedic Surgery

## 2022-02-03 ENCOUNTER — Ambulatory Visit (HOSPITAL_COMMUNITY): Payer: Commercial Managed Care - PPO | Admitting: Physician Assistant

## 2022-02-03 ENCOUNTER — Encounter (HOSPITAL_COMMUNITY)
Admission: RE | Disposition: A | Discharge status: Home Health | Payer: Self-pay | Source: Home / Self Care | Attending: Orthopaedic Surgery

## 2022-02-03 ENCOUNTER — Inpatient Hospital Stay (HOSPITAL_COMMUNITY)
Admission: RE | Admit: 2022-02-03 | Discharge: 2022-02-06 | DRG: 494 | Disposition: A | Discharge status: Home Health | Payer: Commercial Managed Care - PPO | Attending: Orthopaedic Surgery | Admitting: Orthopaedic Surgery

## 2022-02-03 ENCOUNTER — Ambulatory Visit (HOSPITAL_COMMUNITY): Payer: Commercial Managed Care - PPO

## 2022-02-03 DIAGNOSIS — S42032A Displaced fracture of lateral end of left clavicle, initial encounter for closed fracture: Secondary | ICD-10-CM | POA: Diagnosis present

## 2022-02-03 DIAGNOSIS — S42002D Fracture of unspecified part of left clavicle, subsequent encounter for fracture with routine healing: Secondary | ICD-10-CM | POA: Diagnosis not present

## 2022-02-03 DIAGNOSIS — S82871A Displaced pilon fracture of right tibia, initial encounter for closed fracture: Secondary | ICD-10-CM

## 2022-02-03 DIAGNOSIS — M1990 Unspecified osteoarthritis, unspecified site: Secondary | ICD-10-CM | POA: Diagnosis present

## 2022-02-03 DIAGNOSIS — Z981 Arthrodesis status: Secondary | ICD-10-CM

## 2022-02-03 DIAGNOSIS — S82851D Displaced trimalleolar fracture of right lower leg, subsequent encounter for closed fracture with routine healing: Secondary | ICD-10-CM | POA: Diagnosis not present

## 2022-02-03 DIAGNOSIS — Z96612 Presence of left artificial shoulder joint: Secondary | ICD-10-CM | POA: Diagnosis present

## 2022-02-03 DIAGNOSIS — M810 Age-related osteoporosis without current pathological fracture: Secondary | ICD-10-CM | POA: Diagnosis present

## 2022-02-03 DIAGNOSIS — S8261XA Displaced fracture of lateral malleolus of right fibula, initial encounter for closed fracture: Secondary | ICD-10-CM

## 2022-02-03 DIAGNOSIS — S82891A Other fracture of right lower leg, initial encounter for closed fracture: Secondary | ICD-10-CM | POA: Diagnosis present

## 2022-02-03 DIAGNOSIS — Z96611 Presence of right artificial shoulder joint: Secondary | ICD-10-CM | POA: Diagnosis present

## 2022-02-03 DIAGNOSIS — S42002A Fracture of unspecified part of left clavicle, initial encounter for closed fracture: Secondary | ICD-10-CM

## 2022-02-03 DIAGNOSIS — W010XXA Fall on same level from slipping, tripping and stumbling without subsequent striking against object, initial encounter: Secondary | ICD-10-CM | POA: Diagnosis present

## 2022-02-03 DIAGNOSIS — F909 Attention-deficit hyperactivity disorder, unspecified type: Secondary | ICD-10-CM | POA: Diagnosis present

## 2022-02-03 DIAGNOSIS — G8918 Other acute postprocedural pain: Secondary | ICD-10-CM | POA: Diagnosis not present

## 2022-02-03 DIAGNOSIS — S82841A Displaced bimalleolar fracture of right lower leg, initial encounter for closed fracture: Principal | ICD-10-CM | POA: Diagnosis present

## 2022-02-03 DIAGNOSIS — S4382XA Sprain of other specified parts of left shoulder girdle, initial encounter: Secondary | ICD-10-CM | POA: Diagnosis not present

## 2022-02-03 DIAGNOSIS — I7300 Raynaud's syndrome without gangrene: Secondary | ICD-10-CM | POA: Diagnosis present

## 2022-02-03 DIAGNOSIS — Z79899 Other long term (current) drug therapy: Secondary | ICD-10-CM

## 2022-02-03 DIAGNOSIS — Z803 Family history of malignant neoplasm of breast: Secondary | ICD-10-CM

## 2022-02-03 DIAGNOSIS — Z4789 Encounter for other orthopedic aftercare: Secondary | ICD-10-CM | POA: Diagnosis not present

## 2022-02-03 DIAGNOSIS — S8252XA Displaced fracture of medial malleolus of left tibia, initial encounter for closed fracture: Secondary | ICD-10-CM | POA: Diagnosis not present

## 2022-02-03 HISTORY — PX: ORIF FIBULA FRACTURE: SHX5114

## 2022-02-03 HISTORY — DX: Attention-deficit hyperactivity disorder, unspecified type: F90.9

## 2022-02-03 HISTORY — PX: SYNDESMOSIS REPAIR: SHX5182

## 2022-02-03 HISTORY — PX: ORIF CLAVICULAR FRACTURE: SHX5055

## 2022-02-03 LAB — COMPREHENSIVE METABOLIC PANEL
ALT: 15 U/L (ref 0–44)
AST: 21 U/L (ref 15–41)
Albumin: 3.6 g/dL (ref 3.5–5.0)
Alkaline Phosphatase: 80 U/L (ref 38–126)
Anion gap: 8 (ref 5–15)
BUN: 9 mg/dL (ref 8–23)
CO2: 26 mmol/L (ref 22–32)
Calcium: 8.8 mg/dL — ABNORMAL LOW (ref 8.9–10.3)
Chloride: 101 mmol/L (ref 98–111)
Creatinine, Ser: 0.62 mg/dL (ref 0.44–1.00)
GFR, Estimated: >60 mL/min (ref >60)
Glucose, Bld: 81 mg/dL (ref 70–99)
Potassium: 3.8 mmol/L (ref 3.5–5.1)
Sodium: 135 mmol/L (ref 135–145)
Total Bilirubin: 0.9 mg/dL (ref 0.3–1.2)
Total Protein: 6.6 g/dL (ref 6.5–8.1)

## 2022-02-03 LAB — CBC
HCT: 40.1 % (ref 36.0–46.0)
Hemoglobin: 13.9 g/dL (ref 12.0–15.0)
MCH: 33.6 pg (ref 26.0–34.0)
MCHC: 34.7 g/dL (ref 30.0–36.0)
MCV: 96.9 fL (ref 80.0–100.0)
Platelets: 212 10*3/uL (ref 150–400)
RBC: 4.14 MIL/uL (ref 3.87–5.11)
RDW: 13.1 % (ref 11.5–15.5)
WBC: 4.7 10*3/uL (ref 4.0–10.5)
nRBC: 0 % (ref 0.0–0.2)

## 2022-02-03 LAB — SURGICAL PCR SCREEN
MRSA, PCR: NEGATIVE
Staphylococcus aureus: NEGATIVE

## 2022-02-03 SURGERY — OPEN REDUCTION INTERNAL FIXATION (ORIF) FIBULA FRACTURE
Anesthesia: General | Site: Chest | Laterality: Right

## 2022-02-03 SURGERY — OPEN REDUCTION INTERNAL FIXATION (ORIF) CLAVICULAR FRACTURE
Anesthesia: Choice | Laterality: Left

## 2022-02-03 MED ORDER — PHENYLEPHRINE HCL-NACL 20-0.9 MG/250ML-% IV SOLN
INTRAVENOUS | Status: DC | PRN
  Administered 2022-02-03: 25 ug/min via INTRAVENOUS

## 2022-02-03 MED ORDER — VALACYCLOVIR HCL 500 MG PO TABS
1000.0000 mg | ORAL_TABLET | Freq: Every day | ORAL | Status: DC
  Administered 2022-02-04 – 2022-02-06 (×3): 1000 mg via ORAL
  Filled 2022-02-03 (×3): qty 2

## 2022-02-03 MED ORDER — LORATADINE 10 MG PO TABS
10.0000 mg | ORAL_TABLET | Freq: Every day | ORAL | Status: DC
  Administered 2022-02-04 – 2022-02-06 (×3): 10 mg via ORAL
  Filled 2022-02-03 (×3): qty 1

## 2022-02-03 MED ORDER — ENOXAPARIN SODIUM 40 MG/0.4ML IJ SOSY
40.0000 mg | PREFILLED_SYRINGE | INTRAMUSCULAR | Status: DC
  Administered 2022-02-04 – 2022-02-06 (×3): 40 mg via SUBCUTANEOUS
  Filled 2022-02-03 (×3): qty 0.4

## 2022-02-03 MED ORDER — FENTANYL CITRATE (PF) 100 MCG/2ML IJ SOLN
25.0000 ug | INTRAMUSCULAR | Status: DC | PRN
  Administered 2022-02-03: 50 ug via INTRAVENOUS

## 2022-02-03 MED ORDER — ESMOLOL HCL 100 MG/10ML IV SOLN
INTRAVENOUS | Status: DC | PRN
  Administered 2022-02-03: 30 mg via INTRAVENOUS

## 2022-02-03 MED ORDER — EPHEDRINE SULFATE-NACL 50-0.9 MG/10ML-% IV SOSY
PREFILLED_SYRINGE | INTRAVENOUS | Status: DC | PRN
  Administered 2022-02-03: 5 mg via INTRAVENOUS

## 2022-02-03 MED ORDER — ACETAMINOPHEN 500 MG PO TABS
1000.0000 mg | ORAL_TABLET | Freq: Once | ORAL | Status: AC
  Administered 2022-02-03: 1000 mg via ORAL
  Filled 2022-02-03: qty 2

## 2022-02-03 MED ORDER — AMISULPRIDE (ANTIEMETIC) 5 MG/2ML IV SOLN: 10.0000 mg | Freq: Once | INTRAVENOUS | Status: DC | PRN

## 2022-02-03 MED ORDER — PREGABALIN 100 MG PO CAPS
300.0000 mg | ORAL_CAPSULE | Freq: Two times a day (BID) | ORAL | Status: DC
  Administered 2022-02-04 – 2022-02-06 (×6): 300 mg via ORAL
  Filled 2022-02-03 (×6): qty 3

## 2022-02-03 MED ORDER — METHOCARBAMOL 1000 MG/10ML IJ SOLN: 500.0000 mg | Freq: Four times a day (QID) | INTRAVENOUS | Status: DC | PRN

## 2022-02-03 MED ORDER — CEFAZOLIN SODIUM-DEXTROSE 2-4 GM/100ML-% IV SOLN
2.0000 g | INTRAVENOUS | Status: AC
  Administered 2022-02-03: 2 g via INTRAVENOUS
  Filled 2022-02-03: qty 100

## 2022-02-03 MED ORDER — ORAL CARE MOUTH RINSE: 15.0000 mL | Freq: Once | OROMUCOSAL | Status: AC

## 2022-02-03 MED ORDER — CYCLOSPORINE 0.05 % OP EMUL
1.0000 [drp] | Freq: Two times a day (BID) | OPHTHALMIC | Status: DC
  Administered 2022-02-04 – 2022-02-06 (×6): 1 [drp] via OPHTHALMIC
  Filled 2022-02-03 (×7): qty 30

## 2022-02-03 MED ORDER — CARBOXYMETHYLCELL-GLYCERIN PF 0.5-0.9 % OP SOLN: 3.0000 [drp] | Freq: Two times a day (BID) | OPHTHALMIC | Status: DC

## 2022-02-03 MED ORDER — BUPIVACAINE HCL (PF) 0.25 % IJ SOLN: INTRAMUSCULAR | Status: DC | PRN

## 2022-02-03 MED ORDER — CEFAZOLIN SODIUM-DEXTROSE 2-4 GM/100ML-% IV SOLN: 2.0000 g | INTRAVENOUS | Status: DC

## 2022-02-03 MED ORDER — METOCLOPRAMIDE HCL 5 MG PO TABS: 5.0000 mg | ORAL_TABLET | Freq: Three times a day (TID) | ORAL | Status: DC | PRN

## 2022-02-03 MED ORDER — PROMETHAZINE HCL 25 MG/ML IJ SOLN: 6.2500 mg | INTRAMUSCULAR | Status: DC | PRN

## 2022-02-03 MED ORDER — MORPHINE SULFATE (PF) 2 MG/ML IV SOLN
0.5000 mg | INTRAVENOUS | Status: DC | PRN
  Administered 2022-02-04: 1 mg via INTRAVENOUS
  Filled 2022-02-03 (×2): qty 1

## 2022-02-03 MED ORDER — CEFAZOLIN SODIUM-DEXTROSE 1-4 GM/50ML-% IV SOLN
1.0000 g | Freq: Four times a day (QID) | INTRAVENOUS | Status: AC
  Administered 2022-02-03 – 2022-02-04 (×3): 1 g via INTRAVENOUS
  Filled 2022-02-03 (×3): qty 50

## 2022-02-03 MED ORDER — PHENYLEPHRINE HCL (PRESSORS) 10 MG/ML IV SOLN
INTRAVENOUS | Status: AC
  Filled 2022-02-03: qty 1

## 2022-02-03 MED ORDER — PROPOFOL 10 MG/ML IV BOLUS
INTRAVENOUS | Status: DC | PRN
  Administered 2022-02-03: 160 mg via INTRAVENOUS

## 2022-02-03 MED ORDER — LACTATED RINGERS IV SOLN: INTRAVENOUS | Status: DC

## 2022-02-03 MED ORDER — OXYMETAZOLINE HCL 0.05 % NA SOLN: 2.0000 | Freq: Every day | NASAL | Status: DC | PRN

## 2022-02-03 MED ORDER — AMPHETAMINE-DEXTROAMPHET ER 10 MG PO CP24
20.0000 mg | ORAL_CAPSULE | ORAL | Status: DC
  Administered 2022-02-04 – 2022-02-06 (×3): 20 mg via ORAL
  Filled 2022-02-03 (×3): qty 2

## 2022-02-03 MED ORDER — PHENAZOPYRIDINE HCL 200 MG PO TABS
200.0000 mg | ORAL_TABLET | Freq: Once | ORAL | Status: DC
  Filled 2022-02-03 (×2): qty 1

## 2022-02-03 MED ORDER — BUPIVACAINE-EPINEPHRINE (PF) 0.5% -1:200000 IJ SOLN
INTRAMUSCULAR | Status: DC | PRN
  Administered 2022-02-03: 10 mL

## 2022-02-03 MED ORDER — METOCLOPRAMIDE HCL 5 MG/ML IJ SOLN: 5.0000 mg | Freq: Three times a day (TID) | INTRAMUSCULAR | Status: DC | PRN

## 2022-02-03 MED ORDER — FENTANYL CITRATE (PF) 100 MCG/2ML IJ SOLN: 100.0000 ug | Freq: Once | INTRAMUSCULAR | Status: AC

## 2022-02-03 MED ORDER — DOCUSATE SODIUM 100 MG PO CAPS
100.0000 mg | ORAL_CAPSULE | Freq: Two times a day (BID) | ORAL | Status: DC
  Administered 2022-02-03 – 2022-02-06 (×6): 100 mg via ORAL
  Filled 2022-02-03 (×6): qty 1

## 2022-02-03 MED ORDER — CHLORHEXIDINE GLUCONATE 0.12 % MT SOLN
15.0000 mL | Freq: Once | OROMUCOSAL | Status: AC
  Administered 2022-02-03: 15 mL via OROMUCOSAL
  Filled 2022-02-03: qty 15

## 2022-02-03 MED ORDER — 0.9 % SODIUM CHLORIDE (POUR BTL) OPTIME
TOPICAL | Status: DC | PRN
  Administered 2022-02-03: 50 mL

## 2022-02-03 MED ORDER — PROPOFOL 10 MG/ML IV BOLUS
INTRAVENOUS | Status: AC
  Filled 2022-02-03: qty 20

## 2022-02-03 MED ORDER — ACETAMINOPHEN 500 MG PO TABS
500.0000 mg | ORAL_TABLET | Freq: Four times a day (QID) | ORAL | Status: DC
  Administered 2022-02-03: 500 mg via ORAL
  Filled 2022-02-03 (×2): qty 1

## 2022-02-03 MED ORDER — MIDAZOLAM HCL 2 MG/2ML IJ SOLN: 2.0000 mg | Freq: Once | INTRAMUSCULAR | Status: DC

## 2022-02-03 MED ORDER — HYDROMORPHONE HCL 1 MG/ML IJ SOLN
INTRAMUSCULAR | Status: DC | PRN
  Administered 2022-02-03: .5 mg via INTRAVENOUS

## 2022-02-03 MED ORDER — ROCURONIUM BROMIDE 10 MG/ML (PF) SYRINGE
PREFILLED_SYRINGE | INTRAVENOUS | Status: DC | PRN
  Administered 2022-02-03: 60 mg via INTRAVENOUS
  Administered 2022-02-03: 20 mg via INTRAVENOUS
  Administered 2022-02-03: 40 mg via INTRAVENOUS
  Administered 2022-02-03: 20 mg via INTRAVENOUS

## 2022-02-03 MED ORDER — DIPHENHYDRAMINE HCL 12.5 MG/5ML PO ELIX: 12.5000 mg | ORAL_SOLUTION | ORAL | Status: DC | PRN

## 2022-02-03 MED ORDER — HYDROCODONE-ACETAMINOPHEN 5-325 MG PO TABS
1.0000 | ORAL_TABLET | ORAL | Status: DC | PRN
  Administered 2022-02-04: 2 via ORAL
  Filled 2022-02-03: qty 2

## 2022-02-03 MED ORDER — HYDROMORPHONE HCL 1 MG/ML IJ SOLN
INTRAMUSCULAR | Status: AC
  Filled 2022-02-03: qty 0.5

## 2022-02-03 MED ORDER — ACETAMINOPHEN 325 MG PO TABS: 325.0000 mg | ORAL_TABLET | Freq: Four times a day (QID) | ORAL | Status: DC | PRN

## 2022-02-03 MED ORDER — BUPIVACAINE HCL (PF) 0.25 % IJ SOLN
INTRAMUSCULAR | Status: DC | PRN
  Administered 2022-02-03: 15 mL via PERINEURAL

## 2022-02-03 MED ORDER — FENTANYL CITRATE (PF) 250 MCG/5ML IJ SOLN
INTRAMUSCULAR | Status: DC | PRN
  Administered 2022-02-03 (×2): 100 ug via INTRAVENOUS
  Administered 2022-02-03: 50 ug via INTRAVENOUS

## 2022-02-03 MED ORDER — FENTANYL CITRATE (PF) 100 MCG/2ML IJ SOLN
INTRAMUSCULAR | Status: AC
  Filled 2022-02-03: qty 2

## 2022-02-03 MED ORDER — ONDANSETRON HCL 4 MG PO TABS: 4.0000 mg | ORAL_TABLET | Freq: Four times a day (QID) | ORAL | Status: DC | PRN

## 2022-02-03 MED ORDER — METHOCARBAMOL 500 MG PO TABS
500.0000 mg | ORAL_TABLET | Freq: Four times a day (QID) | ORAL | Status: DC | PRN
  Administered 2022-02-03 – 2022-02-06 (×7): 500 mg via ORAL
  Filled 2022-02-03 (×7): qty 1

## 2022-02-03 MED ORDER — FENTANYL CITRATE (PF) 250 MCG/5ML IJ SOLN
INTRAMUSCULAR | Status: AC
  Filled 2022-02-03: qty 5

## 2022-02-03 MED ORDER — FENTANYL CITRATE (PF) 100 MCG/2ML IJ SOLN
INTRAMUSCULAR | Status: AC
  Administered 2022-02-03: 100 ug via INTRAVENOUS
  Filled 2022-02-03: qty 2

## 2022-02-03 MED ORDER — ONDANSETRON HCL 4 MG/2ML IJ SOLN
INTRAMUSCULAR | Status: DC | PRN
  Administered 2022-02-03: 4 mg via INTRAVENOUS

## 2022-02-03 MED ORDER — MIDAZOLAM HCL 2 MG/2ML IJ SOLN
INTRAMUSCULAR | Status: AC
  Filled 2022-02-03: qty 2

## 2022-02-03 MED ORDER — VERAPAMIL HCL ER 120 MG PO TBCR
120.0000 mg | EXTENDED_RELEASE_TABLET | Freq: Every day | ORAL | Status: DC
  Administered 2022-02-04 – 2022-02-05 (×3): 120 mg via ORAL
  Filled 2022-02-03 (×4): qty 1

## 2022-02-03 MED ORDER — BUPIVACAINE LIPOSOME 1.3 % IJ SUSP
INTRAMUSCULAR | Status: DC | PRN
  Administered 2022-02-03: 10 mL via PERINEURAL

## 2022-02-03 MED ORDER — POLYVINYL ALCOHOL 1.4 % OP SOLN
2.0000 [drp] | Freq: Two times a day (BID) | OPHTHALMIC | Status: DC
  Administered 2022-02-04 – 2022-02-06 (×5): 2 [drp] via OPHTHALMIC
  Filled 2022-02-03: qty 15

## 2022-02-03 MED ORDER — ONDANSETRON HCL 4 MG/2ML IJ SOLN: 4.0000 mg | Freq: Four times a day (QID) | INTRAMUSCULAR | Status: DC | PRN

## 2022-02-03 MED ORDER — HYDROCODONE-ACETAMINOPHEN 7.5-325 MG PO TABS
1.0000 | ORAL_TABLET | ORAL | Status: DC | PRN
  Administered 2022-02-03: 1 via ORAL
  Administered 2022-02-04: 2 via ORAL
  Filled 2022-02-03: qty 1
  Filled 2022-02-03: qty 2

## 2022-02-03 SURGICAL SUPPLY — 83 items
AID PSTN UNV HD RSTRNT DISP (MISCELLANEOUS) ×2
ALCOHOL 70% 16 OZ (MISCELLANEOUS) ×3 IMPLANT
APL PRP STRL LF DISP 70% ISPRP (MISCELLANEOUS) ×2
BAG COUNTER SPONGE SURGICOUNT (BAG) ×3 IMPLANT
BAG SPNG CNTER NS LX DISP (BAG) ×2
BIT DRILL 2 CANN GRADUATED (BIT) IMPLANT
BIT DRILL 2 LNG CALIBR (DRILL) IMPLANT
BIT DRILL 2.5 CANN STRL (BIT) IMPLANT
BIT DRILL 2.6 CANN (BIT) IMPLANT
BIT DRILL SHORT ALPS 2.2 (BIT) IMPLANT
BIT DRILL SHORT ALPS 2.7 (BIT) IMPLANT
BLADE SURG 15 STRL LF DISP TIS (BLADE) ×3 IMPLANT
BLADE SURG 15 STRL SS (BLADE) ×3
BNDG CMPR MED 10X6 ELC LF (GAUZE/BANDAGES/DRESSINGS) ×1
BNDG ELASTIC 6X10 VLCR STRL LF (GAUZE/BANDAGES/DRESSINGS) ×3 IMPLANT
CANISTER SUCT 3000ML PPV (MISCELLANEOUS) ×3 IMPLANT
CHLORAPREP W/TINT 26 (MISCELLANEOUS) ×6 IMPLANT
COVER LIGHT HANDLE UNIVERSAL (MISCELLANEOUS) IMPLANT
COVER SURGICAL LIGHT HANDLE (MISCELLANEOUS) ×3 IMPLANT
CUFF TOURN SGL QUICK 34 (TOURNIQUET CUFF) ×3
CUFF TRNQT CYL 34X4.125X (TOURNIQUET CUFF) ×3 IMPLANT
DRAPE C-ARMOR (DRAPES) IMPLANT
DRSG AQUACEL AG ADV 3.5X 6 (GAUZE/BANDAGES/DRESSINGS) IMPLANT
DRSG XEROFORM 1X8 (GAUZE/BANDAGES/DRESSINGS) ×3 IMPLANT
ELECT REM PT RETURN 9FT ADLT (ELECTROSURGICAL) ×3
ELECTRODE REM PT RTRN 9FT ADLT (ELECTROSURGICAL) ×3 IMPLANT
GAUZE SPONGE 4X4 12PLY STRL (GAUZE/BANDAGES/DRESSINGS) IMPLANT
GAUZE SPONGE 4X4 12PLY STRL LF (GAUZE/BANDAGES/DRESSINGS) IMPLANT
GLOVE BIO SURGEON STRL SZ 6.5 (GLOVE) IMPLANT
GLOVE BIO SURGEON STRL SZ7.5 (GLOVE) IMPLANT
GLOVE BIOGEL M STRL SZ7.5 (GLOVE) ×3 IMPLANT
GLOVE BIOGEL PI IND STRL 6.5 (GLOVE) IMPLANT
GLOVE BIOGEL PI IND STRL 8 (GLOVE) ×3 IMPLANT
GLOVE SURG SS PI 6.5 STRL IVOR (GLOVE) IMPLANT
GOWN STRL REUS W/ TWL LRG LVL3 (GOWN DISPOSABLE) ×3 IMPLANT
GOWN STRL REUS W/ TWL XL LVL3 (GOWN DISPOSABLE) ×6 IMPLANT
GOWN STRL REUS W/TWL LRG LVL3 (GOWN DISPOSABLE) ×6
GOWN STRL REUS W/TWL XL LVL3 (GOWN DISPOSABLE) ×9
GUIDEWIRE 1.35MM (WIRE) IMPLANT
KIT AC JOINT DISP (KITS) IMPLANT
KIT BASIN OR (CUSTOM PROCEDURE TRAY) ×3 IMPLANT
KIT TURNOVER KIT B (KITS) ×3 IMPLANT
NS IRRIG 1000ML POUR BTL (IV SOLUTION) ×3 IMPLANT
PACK ORTHO EXTREMITY (CUSTOM PROCEDURE TRAY) ×3 IMPLANT
PACK SHOULDER (CUSTOM PROCEDURE TRAY) IMPLANT
PAD CAST 4YDX4 CTTN HI CHSV (CAST SUPPLIES) ×3 IMPLANT
PADDING CAST COTTON 4X4 STRL (CAST SUPPLIES) ×3
PLATE DIST CLAV NRW 55 9H LT (Plate) IMPLANT
PLATE DIST TIB 4H RT (Plate) IMPLANT
PLATE LOCK DIST FIB RT TI 4H (Plate) IMPLANT
RESTRAINT HEAD UNIVERSAL NS (MISCELLANEOUS) IMPLANT
RETRIEVER SUT HEWSON (MISCELLANEOUS) IMPLANT
SCREW  LP NL 2.7X10MM (Screw) ×3 IMPLANT
SCREW LOCK 12X2.7X 3 LD (Screw) IMPLANT
SCREW LOCK 14X2.7X 3 LD TPR (Screw) IMPLANT
SCREW LOCK COMP 3X16 (Screw) IMPLANT
SCREW LOCK FT LP 3X42 (Screw) IMPLANT
SCREW LOCKING 2.7X12MM (Screw) ×6 IMPLANT
SCREW LOCKING 2.7X14 (Screw) ×12 IMPLANT
SCREW LP 3.5X12MM (Screw) IMPLANT
SCREW LP NL 2.7X10MM (Screw) IMPLANT
SCREW LP TI 3.5X14MM (Screw) IMPLANT
SCREW LP TIT 3.5X30 (Screw) IMPLANT
SCREW LP TIT 3.5X32 (Screw) IMPLANT
SCREW LP TITANIUM 3.5X24MM (Screw) IMPLANT
SCREW QCKFIX CANN 4.0X40MM (Screw) IMPLANT
SCREW VAL KREULOCK 3.0X18 TI (Screw) IMPLANT
SLING ARM FOAM STRAP MED (SOFTGOODS) IMPLANT
SPLINT PLASTER CAST XFAST 5X30 (CAST SUPPLIES) IMPLANT
SPONGE T-LAP 18X18 ~~LOC~~+RFID (SPONGE) ×3 IMPLANT
STRIP CLOSURE SKIN 1/2X4 (GAUZE/BANDAGES/DRESSINGS) IMPLANT
SUCTION FRAZIER HANDLE 10FR (MISCELLANEOUS) ×6
SUCTION TUBE FRAZIER 10FR DISP (MISCELLANEOUS) ×3 IMPLANT
SUT ETHILON 3 0 PS 1 (SUTURE) ×3 IMPLANT
SUT MNCRL AB 3-0 PS2 27 (SUTURE) ×3 IMPLANT
SUT MON AB 4-0 PC3 18 (SUTURE) IMPLANT
SUT VIC AB 0 CT1 36 (SUTURE) IMPLANT
SUT VIC AB 2-0 CT1 27 (SUTURE) ×9
SUT VIC AB 2-0 CT1 TAPERPNT 27 (SUTURE) ×6 IMPLANT
SYR CONTROL 10ML LL (SYRINGE) IMPLANT
TOWEL GREEN STERILE FF (TOWEL DISPOSABLE) ×3 IMPLANT
TUBE CONNECTING 12X1/4 (SUCTIONS) ×3 IMPLANT
ZIPLOOP AC JOINT REPAIR (Orthopedic Implant) IMPLANT

## 2022-02-03 NOTE — Transfer of Care (Signed)
Immediate Anesthesia Transfer of Care Note  Patient: [name]  Procedure(s) Performed: OPEN TREATMENT OF RIGHT PILON ANKLE FRACTURE INCLUDING LATERAL MALLEOLUS FRACTURE (Right: Ankle) POSSIBLE OPEN TREATMENT RIGHT SYNDESMOSIS (Right) OPEN REDUCTION INTERNAL FIXATION (ORIF) LEFT CLAVICULAR FRACTURE WITH CORACOLAVICULAR FIXATION (Left: Chest)  Patient Location: PACU  Anesthesia Type:General  Level of Consciousness: drowsy and patient cooperative  Airway & Oxygen Therapy: Patient Spontanous Breathing  Post-op Assessment: Report given to RN and Post -op Vital signs reviewed and stable  Post vital signs: Reviewed and stable  Last Vitals:  Vitals Value Taken Time  BP 138/83 02/03/22 1636  Temp    Pulse 77 02/03/22 1638  Resp 18 02/03/22 1638  SpO2 96 % 02/03/22 1638  Vitals shown include unvalidated device data.  Last Pain:  Vitals:   02/03/22 1011  TempSrc:   PainSc: 8       Patients Stated Pain Goal: 0 (16/10/96 0454)  Complications: No notable events documented.

## 2022-02-03 NOTE — Anesthesia Procedure Notes (Signed)
Procedure Name: Intubation Date/Time: 02/03/2022 1:25 PM  Performed by: Georgia Duff, CRNAPre-anesthesia Checklist: Patient identified, Emergency Drugs available, Suction available and Patient being monitored Patient Re-evaluated:Patient Re-evaluated prior to induction Oxygen Delivery Method: Circle System Utilized Preoxygenation: Pre-oxygenation with 100% oxygen Induction Type: IV induction Ventilation: Mask ventilation without difficulty Laryngoscope Size: Miller and 2 Grade View: Grade I Tube type: Oral Tube size: 7.0 mm Number of attempts: 1 Airway Equipment and Method: Stylet and Oral airway Placement Confirmation: ETT inserted through vocal cords under direct vision, positive ETCO2 and breath sounds checked- equal and bilateral Secured at: 21 cm Tube secured with: Tape Dental Injury: Teeth and Oropharynx as per pre-operative assessment

## 2022-02-03 NOTE — Anesthesia Postprocedure Evaluation (Signed)
Anesthesia Post Note  Patient: [name]  Procedure(s) Performed: OPEN TREATMENT OF RIGHT PILON ANKLE FRACTURE INCLUDING LATERAL MALLEOLUS FRACTURE (Right: Ankle) POSSIBLE OPEN TREATMENT RIGHT SYNDESMOSIS (Right) OPEN REDUCTION INTERNAL FIXATION (ORIF) LEFT CLAVICULAR FRACTURE WITH CORACOLAVICULAR FIXATION (Left: Chest)     Patient location during evaluation: PACU Anesthesia Type: General Level of consciousness: sedated Pain management: pain level controlled Vital Signs Assessment: post-procedure vital signs reviewed and stable Respiratory status: spontaneous breathing and respiratory function stable Cardiovascular status: stable Postop Assessment: no apparent nausea or vomiting Anesthetic complications: no   No notable events documented.  Last Vitals:  Vitals:   02/03/22 1715 02/03/22 1730  BP: (!) 165/73 (!) 150/70  Pulse: 61 60  Resp: 14 14  Temp:    SpO2: 98% 97%    Last Pain:  Vitals:   02/03/22 1715  TempSrc:   PainSc: 6                  Nicklas Mcsweeney DANIEL

## 2022-02-03 NOTE — Discharge Instructions (Addendum)
Discharge Instructions after Open Shoulder Repair  A sling has been provided for you. Remain in your sling at all times. This includes sleeping in your sling.  Use ice on the shoulder intermittently over the first 48 hours after surgery.  Pain medicine has been prescribed for you.  Use your medicine liberally over the first 48 hours, and then you can begin to taper your use. You may take Extra Strength Tylenol or Tylenol only in place of the pain pills. DO NOT take ANY nonsteroidal anti-inflammatory pain medications: Advil, Motrin, Ibuprofen, Aleve, Naproxen or Naprosyn.  You may remove your dressing after two days  You may shower 5 days after surgery. The incisions CANNOT get wet prior to 5 days. Simply allow the water to wash over the site and then pat dry. Do not rub the incisions. Make sure your axilla (armpit) is completely dry after showering.    Please call [phone] during normal business hours or [phone] after hours for any problems. Including the following:  - excessive redness of the incisions - drainage for more than 4 days - fever of more than 101.5 F  *Please note that pain medications will not be refilled after hours or on weekends.

## 2022-02-03 NOTE — Interval H&P Note (Signed)
History and Physical Interval Note:  02/03/2022 1:04 PM  [name]  has presented today for surgery, with the diagnosis of RIGHT PILON ANKLE FRACTURE  WITH ASSOCIATED LATERAL MALLEOLUS FRACTURE LEFT CLAVICLE FRACTURE.  The various methods of treatment have been discussed with the patient and family. After consideration of risks, benefits and other options for treatment, the patient has consented to  Procedure(s) with comments: OPEN TREATMENT OF RIGHT PILON ANKLE FRACTURE INCLUDING LATERAL MALLEOLUS FRACTURE (Right) - LENGTH OF SURGERY: 120 MINUTES POSSIBLE OPEN TREATMENT RIGHT SYNDESMOSIS (Right) OPEN REDUCTION INTERNAL FIXATION (ORIF) LEFT CLAVICULAR FRACTURE WITH CORACOLAVICULAR FIXATION (Left) - BIOMET ZIP LOOP WITH CLAVICLE PLATES as a surgical intervention.  The patient's history has been reviewed, patient examined, no change in status, stable for surgery.  I have reviewed the patient's chart and labs.  Questions were answered to the patient's satisfaction.     Rhae Hammock

## 2022-02-03 NOTE — Anesthesia Procedure Notes (Addendum)
Anesthesia Regional Block: Popliteal block   Pre-Anesthetic Checklist: , timeout performed,  Correct Patient, Correct Site, Correct Laterality,  Correct Procedure, Correct Position, site marked,  Risks and benefits discussed,  Surgical consent,  Pre-op evaluation,  At surgeon's request and post-op pain management  Laterality: Right  Prep: chloraprep       Needles:  Injection technique: Single-shot  Needle Type: Echogenic Stimulator Needle          Additional Needles:   Procedures:,,,, ultrasound used (permanent image in chart),,    Narrative:  Start time: 02/03/2022 11:08 AM End time: 02/03/2022 11:18 AM Injection made incrementally with aspirations every 5 mL.  Performed by: Personally  Anesthesiologist: Duane Boston, MD  Additional Notes: A functioning IV was confirmed and monitors were applied.  Sterile prep and drape, hand hygiene and sterile gloves were used.  Negative aspiration and test dose prior to incremental administration of local anesthetic. The patient tolerated the procedure well.Ultrasound  guidance: relevant anatomy identified, needle position confirmed, local anesthetic spread visualized around nerve(s), vascular puncture avoided.  Image printed for medical record. ACB supplement.

## 2022-02-03 NOTE — Plan of Care (Signed)

## 2022-02-03 NOTE — Brief Op Note (Signed)
02/03/2022  2:36 PM  PATIENT:  [name]  67 y.o. female  PRE-OPERATIVE DIAGNOSIS:  RIGHT PILON ANKLE FRACTURE  WITH ASSOCIATED LATERAL MALLEOLUS FRACTURE  POST-OPERATIVE DIAGNOSIS:  RIGHT PILON ANKLE FRACTURE WITH ASSOCIATED LATERAL MALLEOLUS FRACTURE   PROCEDURE:   SURGEON:  Surgeon(s) and Role: Panel 1:    * Erle Crocker, MD - Primary   PHYSICIAN ASSISTANT: J Martinique  ASSISTANTS: none   ANESTHESIA:   regional and general  EBL: minimal  BLOOD ADMINISTERED:none  DRAINS: none   LOCAL MEDICATIONS USED:  NONE  SPECIMEN:  No Specimen  DISPOSITION OF SPECIMEN:  N/A  COUNTS:  YES  TOURNIQUET:  * Missing tourniquet times found for documented tourniquets in log: [id number] *  DICTATION: .Dragon Dictation  PLAN OF CARE: Admit for overnight observation  PATIENT DISPOSITION: The second portion of the surgery will be done by Dr. Malena Catholic for her left clavicle fracture.  He took over care of the patient upon my exit.   Delay start of Pharmacological VTE agent (>24hrs) due to surgical blood loss or risk of bleeding: yes

## 2022-02-03 NOTE — Anesthesia Preprocedure Evaluation (Addendum)
Anesthesia Evaluation  Patient identified by MRN, date of birth, ID band Patient awake    Reviewed: Allergy & Precautions, NPO status , Patient's Chart, lab work & pertinent test results  History of Anesthesia Complications Negative for: history of anesthetic complications  Airway Mallampati: II  TM Distance: >3 FB Neck ROM: Full    Dental no notable dental hx. (+) Dental Advisory Given   Pulmonary neg pulmonary ROS   Pulmonary exam normal        Cardiovascular negative cardio ROS Normal cardiovascular exam     Neuro/Psych  PSYCHIATRIC DISORDERS  Depression    negative neurological ROS     GI/Hepatic negative GI ROS,,,(+) Hepatitis -, C  Endo/Other  negative endocrine ROS    Renal/GU negative Renal ROS  negative genitourinary   Musculoskeletal negative musculoskeletal ROS (+)    Abdominal   Peds negative pediatric ROS (+)  Hematology negative hematology ROS (+)   Anesthesia Other Findings   Reproductive/Obstetrics negative OB ROS                             Anesthesia Physical Anesthesia Plan  ASA: 2  Anesthesia Plan: General   Post-op Pain Management: Regional block*   Induction: Intravenous  PONV Risk Score and Plan: 4 or greater and Ondansetron, Dexamethasone, Midazolam and Treatment may vary due to age or medical condition  Airway Management Planned: Oral ETT  Additional Equipment:   Intra-op Plan:   Post-operative Plan: Extubation in OR  Informed Consent: I have reviewed the patients History and Physical, chart, labs and discussed the procedure including the risks, benefits and alternatives for the proposed anesthesia with the patient or authorized representative who has indicated his/her understanding and acceptance.     Dental advisory given  Plan Discussed with: Anesthesiologist, CRNA and Surgeon  Anesthesia Plan Comments:         Anesthesia Quick  Evaluation

## 2022-02-03 NOTE — H&P (Signed)
PREOPERATIVE H&P  Chief Complaint: Right ankle pain  HPI: [name] is a 67 y.o. female who presents for preoperative history and physical with a diagnosis of right intra-articular distal tibia fracture with associated fibula and medial malleolus fracture.  She is indicated for surgery due to the displacement of the fracture.  She is here today for surgery.  Symptoms are rated as moderate to severe, and have been worsening.  This is significantly impairing activities of daily living.  She has elected for surgical management.   Past Medical History:  Diagnosis Date   ADHD (attention deficit hyperactivity disorder)    ARDS (adult respiratory distress syndrome) ([place]) 2007   Arthritis    Depression    Gallstones 01/07/2018   Ganglion cyst of dorsum of right wrist    Headache    Hepatitis    2004 non detectable now   MRSA (methicillin resistant Staphylococcus aureus) 2007   Osteoporosis    Pneumonia 2009   Prosthetic eye globe    right eye    Raynaud's disease    Past Surgical History:  Procedure Laterality Date   ABDOMINAL HYSTERECTOMY     ANTERIOR CERVICAL DECOMP/DISCECTOMY FUSION N/A 12/12/2014   Procedure: ANTERIOR CERVICAL DECOMPRESSION/DISCECTOMY FUSION 3 LEVELS;  Surgeon: Phylliss Bob, MD;  Location: [facility];  Service: Orthopedics;  Laterality: N/A;  Anterior cervical decompression fusion, cerivcal 5-6, cervical 6-7, cervical 7-thoracic 1 with instrumentation and allografrt   APPENDECTOMY  1974   CATARACT EXTRACTION Left 11/2018   CHOLECYSTECTOMY N/A 01/07/2018   Procedure: LAPAROSCOPIC CHOLECYSTECTOMY WITH INTRAOPERATIVE CHOLANGIOGRAM;  Surgeon: Fanny Skates, MD;  Location: [facility];  Service: General;  Laterality: N/A;   COLONOSCOPY  04/14/2013   per Dr. Ardis Hughs, clear, repeat in 10 yrs    CORNEAL TRANSPLANT Right 2007   x4   ENUCLEATION Right 2013   eye infection   GANGLION CYST EXCISION Right 05/25/2016   Procedure: RIGHT WRIST ULNAR GANGLION EXCISION;   Surgeon: Milly Jakob, MD;  Location: [place];  Service: Orthopedics;  Laterality: Right;   REVERSE SHOULDER ARTHROPLASTY Right 07/10/2021   Procedure: REVERSE SHOULDER ARTHROPLASTY;  Surgeon: Tania Ade, MD;  Location: [facility];  Service: Orthopedics;  Laterality: Right;   RHINOPLASTY  1980   ROBOTIC ASSISTED LAPAROSCOPIC SACROCOLPOPEXY Bilateral 06/13/2020   Procedure: XI ROBOTIC ASSISTED LAPAROSCOPIC SACROCOLPOPEXY AND SUPRACERVICAL HYSTERECTOMY AND BILATERAL  SALPINGO OOPHERECTOMY;  Surgeon: Ardis Hughs, MD;  Location: [facility];  Service: Urology;  Laterality: Bilateral;   TONSILLECTOMY     removed in 3rd grade   TOTAL SHOULDER ARTHROPLASTY Left 03/13/2019   Procedure: TOTAL SHOULDER ARTHROPLASTY;  Surgeon: Tania Ade, MD;  Location: [facility];  Service: Orthopedics;  Laterality: Left;   Social History   Socioeconomic History   Marital status: Single    Spouse name: Not on file   Number of children: Not on file   Years of education: Not on file   Highest education level: Bachelor's degree (e.g., BA, AB, BS)  Occupational History   Not on file  Tobacco Use   Smoking status: Never   Smokeless tobacco: Never  Vaping Use   Vaping Use: Never used  Substance and Sexual Activity   Alcohol use: Yes    Comment: occ   Drug use: No   Sexual activity: Never    Birth control/protection: Post-menopausal  Other Topics Concern   Not on file  Social History Narrative   Not on file   Social Determinants of Health  Financial Resource Strain: Low Risk  (08/14/2021)   Overall Financial Resource Strain (CARDIA)    Difficulty of Paying Living Expenses: Not hard at all  Food Insecurity: No Food Insecurity (08/14/2021)   Hunger Vital Sign    Worried About Running Out of Food in the Last Year: Never true    Ran Out of Food in the Last Year: Never true  Transportation Needs: No Transportation Needs (08/14/2021)   PRAPARE - Armed forces logistics/support/administrative officer (Medical): No    Lack of Transportation (Non-Medical): No  Physical Activity: Sufficiently Active (08/14/2021)   Exercise Vital Sign    Days of Exercise per Week: 4 days    Minutes of Exercise per Session: 90 min  Stress: No Stress Concern Present (08/14/2021)   [place]    Feeling of Stress : Not at all  Social Connections: Moderately Isolated (08/14/2021)   Social Connection and Isolation Panel [NHANES]    Frequency of Communication with Friends and Family: More than three times a week    Frequency of Social Gatherings with Friends and Family: Once a week    Attends Religious Services: More than 4 times per year    Active Member of [employer] or Organizations: No    Attends Music therapist: Not on file    Marital Status: Divorced   Family History  Problem Relation Age of Onset   Breast cancer Maternal Aunt 60   Colon cancer Neg Hx    No Known Allergies Prior to Admission medications   Medication Sig Start Date End Date Taking? Authorizing Provider  Carboxymethylcellul-Glycerin (REFRESH OPTIVE PF OP) Apply 3-4 drops to eye 2 (two) times daily as needed (dry eyes).   Yes [provider]  cetirizine (ZYRTEC) 10 MG tablet Take 10 mg by mouth daily.   Yes [provider]  conjugated estrogens (PREMARIN) vaginal cream Use small bead on tip of finger and swab inside of vagina every other day. Patient taking differently: Place 1 Applicatorful vaginally See admin instructions. 1 application every 3 days 06/14/20  Yes Ardis Hughs, MD  COVID-19 mRNA vaccine [phone] (COMIRNATY) syringe Inject into the muscle. 01/08/22  Yes Carlyle Basques, MD  cycloSPORINE (RESTASIS) 0.05 % ophthalmic emulsion Place 1 drop into both eyes 2 times daily. Patient taking differently: Place 1 drop into both eyes 2 (two) times daily. 06/25/21  Yes   estradiol (ESTRACE VAGINAL) 0.1 MG/GM vaginal  cream Apply a fingertip amount to urethra 2 times per week. 09/30/21  Yes   Oxymetazoline HCl (NASAL SPRAY) 0.05 % SOLN Place 2 sprays into the nose daily as needed (congestion).   Yes [provider]  polyethylene glycol powder (GLYCOLAX/MIRALAX) powder TAKE 17 GRAMS BY MOUTH 2 TIMES DAILY AS NEEDED. Patient taking differently: Take 17 g by mouth See admin instructions. 17 g once every 3 days 02/02/17  Yes Laurey Morale, MD  pregabalin (LYRICA) 300 MG capsule Take 1 capsule (300 mg total) by mouth 2 (two) times daily. 11/13/21  Yes Laurey Morale, MD  valACYclovir (VALTREX) 1000 MG tablet Take 1 tablet (1,000 mg total) by mouth 3 (three) times daily. 12/09/21  Yes Laurey Morale, MD  amphetamine-dextroamphetamine (ADDERALL XR) 20 MG 24 hr capsule Take 1 capsule (20 mg total) by mouth every morning. 01/28/22 02/27/22  Laurey Morale, MD  amphetamine-dextroamphetamine (ADDERALL XR) 20 MG 24 hr capsule Take 1 capsule (20 mg total) by mouth every morning. 01/28/22  Laurey Morale, MD  celecoxib (CELEBREX) 200 MG capsule Take 1 capsule (200 mg total) by mouth 2 (two) times daily. 08/29/21   Laurey Morale, MD  methocarbamol (ROBAXIN) 500 MG tablet Take 1 tablet (500 mg total) by mouth every 8 (eight) hours as needed for muscle spasms. 07/10/21   P[place], Amber, PA-C  methylPREDNISolone (MEDROL DOSEPAK) 4 MG TBPK tablet As directed 12/09/21   Laurey Morale, MD  phenazopyridine (PYRIDIUM) 200 MG tablet Take 1 tablet (200 mg total) by mouth 3 (three) times daily as needed. 09/30/21     traMADol (ULTRAM) 50 MG tablet Take 1 to 2 tablets by mouth every 6 hours as needed for pain. 09/17/21   Ardis Hughs, MD  valACYclovir (VALTREX) 1000 MG tablet Take 1 tablet by mouth once daily. 05/07/21     verapamil (CALAN-SR) 120 MG CR tablet Take 1 tablet (120 mg total) by mouth at bedtime. 10/21/21   Laurey Morale, MD     Positive ROS: All other systems have been reviewed and were otherwise negative with the  exception of those mentioned in the HPI and as above.  Physical Exam:  Vitals:   02/03/22 1215 02/03/22 1220  BP: (!) 106/56 (!) 111/49  Pulse: (!) 58 (!) 58  Resp: 17 12  Temp:    SpO2: 99% 100%   General: Alert, no acute distress Cardiovascular: No pedal edema Respiratory: No cyanosis, no use of accessory musculature GI: No organomegaly, abdomen is soft and non-tender Skin: No lesions in the area of chief complaint Neurologic: Sensation intact distally Psychiatric: Patient is competent for consent with normal mood and affect Lymphatic: No axillary or cervical lymphadenopathy  MUSCULOSKELETAL: Ankle in a short leg splint.  Toes exposed are warm and well-perfused.  No tenderness proximal to the splint.  Alignment appears clinically well-maintained.  Foot is warm and well-perfused  Assessment: Right intra-articular distal tibia fracture with associated fibula and medial malleolus fracture   Plan: Plan for open treatment of her fractures.  We will do this in the prone position.  She will require posterior plating.  Plan is for after the ankle fracture she will then be placed supine for clavicle fracture by Dr. Tamera Punt.  We discussed the risks, benefits and alternatives of surgery which include but are not limited to wound healing complications, infection, nonunion, malunion, need for further surgery, damage to surrounding structures and continued pain.  They understand there is no guarantees to an acceptable outcome.  After weighing these risks they opted to proceed with surgery.     Erle Crocker, MD    02/03/2022 12:41 PM

## 2022-02-04 MED ORDER — HYDROMORPHONE HCL 1 MG/ML IJ SOLN
0.5000 mg | INTRAMUSCULAR | Status: DC | PRN
  Administered 2022-02-04 – 2022-02-05 (×5): 1 mg via INTRAVENOUS
  Administered 2022-02-05: 0.5 mg via INTRAVENOUS
  Administered 2022-02-05 – 2022-02-06 (×4): 1 mg via INTRAVENOUS
  Filled 2022-02-04 (×10): qty 1

## 2022-02-04 MED ORDER — GABAPENTIN 300 MG PO CAPS: 300.0000 mg | ORAL_CAPSULE | Freq: Three times a day (TID) | ORAL | Status: DC

## 2022-02-04 MED ORDER — ACETAMINOPHEN 325 MG PO TABS
650.0000 mg | ORAL_TABLET | Freq: Four times a day (QID) | ORAL | Status: DC
  Administered 2022-02-04 – 2022-02-06 (×9): 650 mg via ORAL
  Filled 2022-02-04 (×10): qty 2

## 2022-02-04 MED ORDER — OXYCODONE HCL 5 MG PO TABS
5.0000 mg | ORAL_TABLET | ORAL | Status: DC | PRN
  Administered 2022-02-06: 10 mg via ORAL
  Filled 2022-02-04: qty 2

## 2022-02-04 MED ORDER — OXYCODONE HCL 5 MG PO TABS
10.0000 mg | ORAL_TABLET | ORAL | Status: DC | PRN
  Administered 2022-02-04 – 2022-02-06 (×10): 15 mg via ORAL
  Filled 2022-02-04 (×10): qty 3

## 2022-02-04 NOTE — Evaluation (Signed)
Physical Therapy Evaluation  Patient Details Name: [name] MRN: [id number] DOB: Oct 29, 1955 Today's Date: 02/04/2022  History of Present Illness  Pt is a 67 y/o female who presents s/p R pilon ankle fracture with associated lateral malleolus fracture, and L clavicle fracture. She is now s/p ORIF of both fractures and is NWB on the RLE and LUE. PMH significant for ADHD, ARDS, Hepatitis, osteoporosis, PNA, R prosthetic eye, Raynaud's disease. ACDF 2016, L TSA 2021.   Clinical Impression  Pt admitted with above diagnosis. Pt currently with functional limitations due to the deficits listed below (see PT Problem List). At the time of PT eval pt was able to perform transfers with gross modified independence and no AD. Anticipate pt will be able to return home with a one arm drive wheelchair with elevating leg rests. She states she will have assistance upon return home and would prefer this over going to rehab. Pt will benefit from skilled PT to increase their independence and safety with mobility to allow discharge to the venue listed below.          Recommendations for follow up therapy are one component of a multi-disciplinary discharge planning process, led by the attending physician.  Recommendations may be updated based on patient status, additional functional criteria and insurance authorization.  Follow Up Recommendations Outpatient PT (When appropriate per post-op protocol)      Assistance Recommended at Discharge Intermittent Supervision/Assistance  Patient can return home with the following  A little help with walking and/or transfers;A little help with bathing/dressing/bathroom;Assistance with cooking/housework;Help with stairs or ramp for entrance;Assist for transportation    Equipment Recommendations Wheelchair (measurements PT);Wheelchair cushion (measurements PT) (One arm drive with elevating leg rests,)  Recommendations for Other Services       Functional Status  Assessment Patient has had a recent decline in their functional status and demonstrates the ability to make significant improvements in function in a reasonable and predictable amount of time.     Precautions / Restrictions Precautions Precautions: Fall;Shoulder Type of Shoulder Precautions: post-op left ORIF clavicle fx Shoulder Interventions: Shoulder sling/immobilizer;At all times;Off for dressing/bathing/exercises Required Braces or Orthoses: Sling;Splint/Cast Splint/Cast: RLE, LUE - sling Restrictions Weight Bearing Restrictions: Yes LUE Weight Bearing: Non weight bearing RLE Weight Bearing: Non weight bearing      Mobility  Bed Mobility Overal bed mobility: Modified Independent             General bed mobility comments: Pt was able to transition to EOB without assistance. Increased time but able to manage RLE without assist and able to maintain NWB status on the LUE. Pt held up RLE off floor well when scooting out.    Transfers Overall transfer level: Modified independent Equipment used: None               General transfer comment: Pt was able to laterally scoot transfer along EOB, as well as transition from bed to drop arm recliner. Good maintenance of NWB status on LUE and RLE.    Ambulation/Gait               General Gait Details: Deferred due to weight bearing restrictions  Stairs            Wheelchair Mobility    Modified Rankin (Stroke Patients Only)       Balance Overall balance assessment: Needs assistance Sitting-balance support: Feet supported, No upper extremity supported Sitting balance-Leahy Scale: Normal     Standing balance support: Single extremity supported, During functional  activity Standing balance-Leahy Scale: Poor Standing balance comment: Pt requires 1 UE support for balance during transfers                             Pertinent Vitals/Pain Pain Assessment Pain Assessment: Faces Faces Pain Scale:  Hurts little more Pain Location: RLE>LUE Pain Descriptors / Indicators: Operative site guarding, Sore Pain Intervention(s): Limited activity within patient's tolerance, Monitored during session, Repositioned    Home Living Family/patient expects to be discharged to:: Private residence Living Arrangements: Parent Available Help at Discharge: Family;Personal care attendant;Available PRN/intermittently Type of Home: House Home Access: Ramped entrance       Home Layout: One level Home Equipment: Shower seat;Cane - single point;Rolling Walker (2 wheels);Rollator (4 wheels);Transport chair;BSC/3in1;Other (comment) (knee scooter)      Prior Function Prior Level of Function : Needs assist             Mobility Comments: Has been using the knee scooter for the last couple weeks since ankle fx. ADLs Comments: Son and his  fiance have been helping her to the bathroom. Pt typically does "everything" at home, inclusing yard work. Pt takes care of her mother with Alzheimer's and also states she does "everything" for her as well.     Hand Dominance   Dominant Hand: Right    Extremity/Trunk Assessment   Upper Extremity Assessment Upper Extremity Assessment: LUE deficits/detail LUE Deficits / Details: s/p left ORIF clavicle fx. Verbal education provided on proper UE positioning in sling. Provided adjustments for proper positioning and comfort. LUE: Unable to fully assess due to immobilization LUE Coordination: decreased fine motor;decreased gross motor    Lower Extremity Assessment Lower Extremity Assessment: Defer to PT evaluation RLE Deficits / Details: LE casted, pt able to wiggle toes and hold R foot up in the air for extended periods (minutes) RLE: Unable to fully assess due to immobilization    Cervical / Trunk Assessment Cervical / Trunk Assessment: Normal  Communication   Communication: No difficulties  Cognition Arousal/Alertness: Awake/alert Behavior During Therapy: WFL  for tasks assessed/performed Overall Cognitive Status: Within Functional Limits for tasks assessed                                          General Comments      Exercises     Assessment/Plan    PT Assessment Patient needs continued PT services  PT Problem List Decreased strength;Decreased range of motion;Decreased activity tolerance;Decreased balance;Decreased mobility;Decreased knowledge of use of DME;Decreased safety awareness;Decreased knowledge of precautions;Pain       PT Treatment Interventions DME instruction;Gait training;Functional mobility training;Therapeutic activities;Therapeutic exercise;Balance training;Patient/family education    PT Goals (Current goals can be found in the Care Plan section)  Acute Rehab PT Goals Patient Stated Goal: Return home at d/c PT Goal Formulation: With patient Time For Goal Achievement: 02/11/22 Potential to Achieve Goals: Good Additional Goals Additional Goal #1: Pt will be able to demonstrate >125' in the one arm drive wheelchair with modified independence.    Frequency Min 4X/week     Co-evaluation PT/OT/SLP Co-Evaluation/Treatment: Yes Reason for Co-Treatment: To address functional/ADL transfers PT goals addressed during session: Mobility/safety with mobility;Balance;Proper use of DME;Strengthening/ROM OT goals addressed during session: Strengthening/ROM;Proper use of Adaptive equipment and DME;ADL's and self-care       AM-PAC PT "6 Clicks" Mobility  Outcome Measure  Help needed turning from your back to your side while in a flat bed without using bedrails?: None Help needed moving from lying on your back to sitting on the side of a flat bed without using bedrails?: None Help needed moving to and from a bed to a chair (including a wheelchair)?: None Help needed standing up from a chair using your arms (e.g., wheelchair or bedside chair)?: A Little Help needed to walk in hospital room?: Total Help needed  climbing 3-5 steps with a railing? : Total 6 Click Score: 17    End of Session Equipment Utilized During Treatment: Gait belt Activity Tolerance: Patient tolerated treatment well Patient left: in chair;with call bell/phone within reach;with chair alarm set;with family/visitor present Nurse Communication: Mobility status PT Visit Diagnosis: Unsteadiness on feet (R26.81);Pain;Other abnormalities of gait and mobility (R26.89) Pain - Right/Left:  (bilateral) Pain - part of body: Shoulder;Ankle and joints of foot;Leg    Time: 3818-2993 PT Time Calculation (min) (ACUTE ONLY): 25 min   Charges:   PT Evaluation $PT Eval Moderate Complexity: 1 Mod          Rolinda Roan, PT, DPT Acute Rehabilitation Services Secure Chat Preferred Office: [phone]   Thelma Comp 02/04/2022, 1:20 PM

## 2022-02-04 NOTE — Evaluation (Signed)
Occupational Therapy Evaluation Patient Details Name: [name] MRN: [id number] DOB: 1955-09-23 Today's Date: 02/04/2022   History of Present Illness Pt is a 67 y/o female who presents s/p R pilon ankle fracture with associated lateral malleolus fracture, and L clavicle fracture. She is now s/p ORIF of both fractures and is NWB on the RLE and LUE. PMH significant for ADHD, ARDS, Hepatitis, osteoporosis, PNA, R prosthetic eye, Raynaud's disease. ACDF 2016, L TSA 2021.   Clinical Impression   Pt in bed upon therapy arrival and agreeable to participate in OT evaluation. Prior to admit, pt was independent with all ADL tasks and functional mobility. Currently, pt is requiring some physical assist due to inability to utilize her LUE with current UE precautions and sling. Pt is able to perform a functional t/f from bed to recliner without assistance while adhering to WB precautions for both upper and lower extremity. Recommend one more treatment to provide patient education regarding sling use, shoulder precautions, HEP, and compensatory techniques for ADL tasks.      Recommendations for follow up therapy are one component of a multi-disciplinary discharge planning process, led by the attending physician.  Recommendations may be updated based on patient status, additional functional criteria and insurance authorization.   Follow Up Recommendations  Follow physician's recommendations for discharge plan and follow up therapies     Assistance Recommended at Discharge PRN  Patient can return home with the following A little help with walking and/or transfers;A little help with bathing/dressing/bathroom;Help with stairs or ramp for entrance;Assist for transportation;Assistance with cooking/housework    Functional Status Assessment  Patient has had a recent decline in their functional status and demonstrates the ability to make significant improvements in function in a reasonable and  predictable amount of time.  Equipment Recommendations  Other (comment) (TBD)       Precautions / Restrictions Precautions Precautions: Fall;Shoulder Type of Shoulder Precautions: post-op left ORIF clavicle fx Shoulder Interventions: Shoulder sling/immobilizer;At all times;Off for dressing/bathing/exercises Required Braces or Orthoses: Sling;Splint/Cast Splint/Cast: RLE, LUE - sling Restrictions Weight Bearing Restrictions: Yes LUE Weight Bearing: Non weight bearing RLE Weight Bearing: Non weight bearing      Mobility Bed Mobility Overal bed mobility: Modified Independent     General bed mobility comments: Pt was able to transition to EOB without assistance. Increased time but able to manage RLE without assist and able to maintain NWB status on the LUE. Pt held up RLE off floor well when scooting out. Patient Response: Cooperative  Transfers Overall transfer level: Modified independent Equipment used: None     General transfer comment: Pt was able to laterally scoot transfer along EOB, as well as transition from bed to drop arm recliner. Good maintenance of NWB status on LUE and RLE.      Balance Overall balance assessment: Needs assistance Sitting-balance support: Feet supported, No upper extremity supported Sitting balance-Leahy Scale: Normal     Standing balance support: Single extremity supported, During functional activity Standing balance-Leahy Scale: Poor Standing balance comment: Pt requires 1 UE support for balance during transfers     ADL either performed or assessed with clinical judgement   ADL Overall ADL's : Needs assistance/impaired Eating/Feeding: Set up;Sitting   Grooming: Wash/dry face;Wash/dry hands;Oral care;Set up;Sitting   Upper Body Bathing: Minimal assistance;Cueing for UE precautions;Sitting   Lower Body Bathing: Minimal assistance;Sitting/lateral leans;Bed level   Upper Body Dressing : Minimal assistance;Sitting;Adhering to UE  precautions   Lower Body Dressing: Minimal assistance;Sitting/lateral leans;Bed level   Toilet Transfer:  Min guard;Requires drop arm Toilet Transfer Details (indicate cue type and reason): simulated from bed to recliner (drop arm towards right) Toileting- Clothing Manipulation and Hygiene: Minimal assistance;Sitting/lateral lean         Vision Baseline Vision/History: 0 No visual deficits Ability to See in Adequate Light: 0 Adequate Patient Visual Report: No change from baseline Vision Assessment?: No apparent visual deficits     Perception         Pertinent Vitals/Pain Pain Assessment Pain Assessment: Faces Faces Pain Scale: Hurts little more Pain Location: RLE>LUE Pain Descriptors / Indicators: Operative site guarding, Sore Pain Intervention(s): Limited activity within patient's tolerance, Monitored during session, Repositioned     Hand Dominance Right   Extremity/Trunk Assessment Upper Extremity Assessment Upper Extremity Assessment: LUE deficits/detail LUE Deficits / Details: s/p left ORIF clavicle fx. Verbal education provided on proper UE positioning in sling. Provided adjustments for proper positioning and comfort. LUE: Unable to fully assess due to immobilization LUE Coordination: decreased fine motor;decreased gross motor   Lower Extremity Assessment Lower Extremity Assessment: Defer to PT evaluation RLE Deficits / Details: LE casted, pt able to wiggle toes and hold R foot up in the air for extended periods (minutes) RLE: Unable to fully assess due to immobilization   Cervical / Trunk Assessment Cervical / Trunk Assessment: Normal   Communication Communication Communication: No difficulties   Cognition Arousal/Alertness: Awake/alert   Overall Cognitive Status: Within Functional Limits for tasks assessed                  Home Living Family/patient expects to be discharged to:: Private residence Living Arrangements: Parent Available Help at  Discharge: Family;Personal care attendant;Available PRN/intermittently Type of Home: House Home Access: Ramped entrance     Home Layout: One level     Bathroom Shower/Tub: Tub/shower unit;Walk-in shower   Bathroom Toilet: Standard     Home Equipment: Shower seat;Cane - single Barista (2 wheels);Rollator (4 wheels);Transport chair;BSC/3in1;Other (comment) (knee scooter)          Prior Functioning/Environment Prior Level of Function : Needs assist             Mobility Comments: Has been using the knee scooter for the last couple weeks since ankle fx. ADLs Comments: Son and his  fiance have been helping her to the bathroom. Pt typically does "everything" at home, inclusing yard work. Pt takes care of her mother with Alzheimer's and also states she does "everything" for her as well.        OT Problem List: Impaired UE functional use;Impaired balance (sitting and/or standing);Decreased knowledge of use of DME or AE      OT Treatment/Interventions: Self-care/ADL training;Therapeutic activities;Therapeutic exercise;Neuromuscular education;Energy conservation;Patient/family education;DME and/or AE instruction;Balance training;Manual therapy;Modalities    OT Goals(Current goals can be found in the care plan section) Acute Rehab OT Goals Patient Stated Goal: to go home OT Goal Formulation: With patient Time For Goal Achievement: 02/18/22 Potential to Achieve Goals: Good  OT Frequency: Min 2X/week    Co-evaluation PT/OT/SLP Co-Evaluation/Treatment: Yes Reason for Co-Treatment: To address functional/ADL transfers PT goals addressed during session: Mobility/safety with mobility;Balance;Proper use of DME;Strengthening/ROM OT goals addressed during session: Strengthening/ROM;Proper use of Adaptive equipment and DME;ADL's and self-care      AM-PAC OT "6 Clicks" Daily Activity     Outcome Measure Help from another person eating meals?: None Help from another person  taking care of personal grooming?: None Help from another person toileting, which includes using toliet, bedpan, or urinal?: A  Little Help from another person bathing (including washing, rinsing, drying)?: A Little Help from another person to put on and taking off regular upper body clothing?: A Little Help from another person to put on and taking off regular lower body clothing?: A Little 6 Click Score: 20   End of Session Nurse Communication: Mobility status  Activity Tolerance: Patient tolerated treatment well Patient left: in chair;with call bell/phone within reach;with chair alarm set;with family/visitor present  OT Visit Diagnosis: Muscle weakness (generalized) (M62.81);History of falling (Z91.81)                Time: 9012-2241 OT Time Calculation (min): 24 min Charges:  OT General Charges $OT Visit: 1 Visit OT Evaluation $OT Eval Moderate Complexity: 1 Mod  [employer], OTR/L,CBIS  Supplemental OT - [facility] and [facility] Secure Chat Preferred    Leyton Magoon, Clarene Duke 02/04/2022, 1:16 PM

## 2022-02-04 NOTE — Progress Notes (Signed)
Patient was reevaluated this afternoon.  Pain much more controlled on current regimen.  She is up sitting in the chair.  She has worked with physical therapy.    Exam of the right ankle is unchanged from prior.  She has no pain with passive stretch of the toes.  Toes are warm and well perfused with intact sensation.  She is able to wiggle the toes.  Calf and proximal compartments are soft.  Exposed skin is benign.  Left shoulder exam is unchanged and stable.  Pain is now controlled.  She likely had worsening pain as the nerve block wore off.  No concerning symptoms at this time. We will continue her current regimen.

## 2022-02-04 NOTE — Progress Notes (Signed)
     [name] is a 67 y.o. female   Orthopaedic diagnosis:   Right intra-articular distal tibia fracture with associated fibula and medial malleolus fracture -status post ORIF 02/03/2022 by Dr. Lucia Gaskins  Left clavicle fracture status post ORIF 02/03/2021 by Dr. Tamera Punt  Subjective: Patient reports nerve block wore off early this morning and she had worsening pain in her right ankle.  She describes burning sensations.  Pain not controlled on current regimen of hydrocodone and morphine.  She does tell me that she was taking 20 mg of oxycodone prior to admission.  She also takes Lyrica for nerve pain chronically, but has not received this dose today.  Pain in the left upper extremity controlled.  Tolerating p.o. well.    Objectyive: Vitals:   02/04/22 0003 02/04/22 0413  BP: 129/69 134/81  Pulse: 95 79  Resp: 17 17  Temp: 98.7 F (37.1 C) 98.4 F (36.9 C)  SpO2: 97% 97%     Exam: Awake and alert Respirations even and unlabored No acute distress  Examination of the right lower extremity demonstrates well-fitted clean, dry lower extremity splint.  There is aging ecchymosis about exposed toes.  Minimal swelling in the toes and forefoot.  No significant tenderness palpation distal proximal to the splint.  She has minimal discomfort with passive stretch of the toes.  Proximal calf is soft and nontender.  Exposed skin is otherwise benign.  Capillary refill is brisk.  Toes are warm and well-perfused with intact sensation.  Examination of the left shoulder shows clean, dry, intact surgical dressing.  The dressing was not removed.  There is no gross deformity.  Range of motion at the shoulder not assessed.  She able to flex and extend at the elbow.  She has good sensation and motor function in the hand.  Palpable +2 radial pulse.  Assessment: Postop day 1 status post the above, having increased pain overnight likely due to nerve block wearing off.  We will discontinue hydrocodone and  morphine and increase her to oxycodone and Dilaudid.  It does appear that she was taking quite a bit of narcotics preoperatively.  Plan: Left shoulder -Nonweightbearing left upper extremity in sling.  Okay for range of motion at the elbow and hand. -Keep dressing clean, dry, and intact  Right ankle -Nonweightbearing right lower extremity -Elevate right lower extremity -Keep splint clean, dry, and intact -Frequent neurovascular assessments  I will plan to call her around lunchtime today to make sure her pain is improving with adjustments in her pain regimen.   We did discuss disposition.  She is planning for SNF placement she does not have much help at home and will be nonweightbearing on multiple extremities.  Will await physical therapy recommendations.  Plan for outpatient follow-up 2 weeks from surgery with Dr. Lucia Gaskins for her right ankle and Dr. Tamera Punt for her left shoulder for reevaluation, suture removal appropriate, and repeat x-rays.  She will likely go into a nonweightbearing short leg cast in the right lower extremity.   Preslee Regas J. [place], PA-C

## 2022-02-04 NOTE — Progress Notes (Signed)
Patient currently with NWB status of LUE and RLE which impairs their ability to perform daily activities like ambulation in the home.  A walker alone will not resolve the issues with performing activities of daily living. A wheelchair will allow patient to safely perform daily activities.  The patient can self propel in the home or has a caregiver who can provide assistance.      Rolinda Roan, PT, DPT Acute Rehabilitation Services Secure Chat Preferred Office: [phone]

## 2022-02-04 NOTE — Op Note (Addendum)
Procedure(s):   [name] female 67 y.o. 02/03/22  Preoperative diagnosis: Left displaced distal clavicle fracture with disruption of coracoclavicular ligaments  Postoperative diagnosis: Same  Procedure performed: Open reduction internal fixation left distal clavicle fracture with coracoclavicular reconstruction.  Surgeon: Isabella Stalling MD   Indications:  67 y.o. female s/p Fall with left Displaced distalclavicle fracture. Indicated for surgery to promote anatomic restoration anatomy, improve functional outcome and avoid skin complications.She has an associated complex ankle fracture being managed by Dr. Lucia Gaskins.     Surgeon: Rhae Hammock   Assistants: Assist  Anesthesia: General endotracheal anesthesia    Procedure Detail    Findings: Anatomic reduction of the fracture with distal clavicular locking plate ([employer]) and coracoclavicular fixation  Estimated Blood Loss:  10 cc         Drains: none  Blood Given: none         Specimens: none        Complications:  * No complications entered in OR log *         Disposition: PACU - hemodynamically stable.         Condition: stable    Procedure:   DESCRIPTION OF PROCEDURE: The patient was identified in preoperative  holding area where I personally marked the operative site after  verifying site, side, and procedure with the patient. The patient was taken back  to the operating room where She initially underwent operative repair of her ankle fracture with Dr. Lucia Gaskins.  When his portion of the procedure was complete we moved the patient from the prone position on the standard operative table to the supine beachchair position on a shoulder frame table. the back  elevated about 40 degrees and all extremities carefully padded and  positioned. The neck was turned very slightly away from the operative field  to assist in exposure. The left upper extremity was then prepped and  draped in a standard sterile  fashion. The appropriate time-out  procedure was carried out. The patient did receive IV antibiotics  within 30 minutes of incision.  An incision was made in [employer] centered over the fracture site. Dissection was carried down through subcutaneous tissues and medial and lateral skin flaps were elevated.  The deltotrapezial fascia was then opened over the clavicle and the  medial and lateral fracture fragments were carefully exposed, taking great care to protect underlying neurovascular structures.  The provisional plate location was verified and threw a planned plate hole the coracoclavicular fixation was past both through the coracoid and through the clavicle.  The button was flipped on the inferior aspect of the coracoid with good initial fixation. The plate was Thenpositioned on the bone using fluoroscopic imaging to verify position. Locking and non locking screws were then used to fill the plate and flouroscopic imaging demonstrated appropriate position and screw lengths. At this point the coracoclavicular button was tensioned and a sudden loss of tension was noted.  X-ray verified that the coracoid button pulled through the inferior aspect of the coracoid due to poor bone quality.  At this point I attempted to completely remove the coracoclavicular fixation but it was noted to be well fixed, likely wedging on the superior cortex of the coracoid.  I did feel that leaving it in place would augment the fixation at this point and therefore the superior button was tensioned and tied down in this position.  Final fluoroscopic imaging demonstrated anatomic reduction of the fracture with appropriate positioning of the plate and screws.  The wound was copiously irrigated with normal saline and the deltotrapezial fascia was  then carefully closed over the construct with #0 vicryl sutures in  interrupted fashion. The skin was then closed with 2-0 Vicryl in a deep  dermal layer, 4-0 Monocryl for skin  closure. Steri-Strips were applied.  10 mL of 0.5% Marcaine with epinephrine were infiltrated for  postoperative pain. Sterile dressings were applied including a medium  Mepilex dressing. The patient was then allowed to awaken from general  anesthesia, placed in a sling, transferred to stretcher and taken to the  recovery room in stable condition.   POSTOPERATIVE PLAN: She will be admitted to the hospital for pain control and physical therapy and will likely need SNF placement given her bilateral extremity issues and lack of appropriate help at home.

## 2022-02-04 NOTE — Progress Notes (Signed)
@   2400 pain expressed as a 8 out of ten- administered 2- '5mg'$  vicodin. Was up to [facility] around 0400- at which time pain up to 10 out of 10- given 1-mg of morphine. At about 0600 up to [facility] at which time pain again a 10. She stated the " IV stuff didn't seem to do a thing". She is able to wiggle toes capilary refill present- not able to palpate DP pulse through the compress wrap- swelling and discoloration of toes unchanged.placed call to answering service for on call phys to call.

## 2022-02-04 NOTE — Progress Notes (Cosign Needed)
    Durable Medical Equipment  (From admission, onward)           Start     Ordered   02/04/22 1356  For home use only DME lightweight manual wheelchair with seat cushion  Once       Comments: Patient suffers from clavicle fx and ankle fx which impairs their ability to perform daily activities like dressing in the home.  A walker will not resolve  issue with performing activities of daily living. A wheelchair will allow patient to safely perform daily activities. Patient is not able to propel themselves in the home using a standard weight wheelchair due to general weakness. Patient can self propel in the lightweight wheelchair. Length of need 6 months . Accessories: elevating leg rests (ELRs), wheel locks, extensions and anti-tippers.  Wheelchair (measurements PT);Wheelchair cushion (measurements PT) (One arm drive with elevating leg rests,)   02/04/22 1357

## 2022-02-04 NOTE — TOC Initial Note (Signed)
Transition of Care [facility]) - Initial/Assessment Note    Patient Details  Name: [name] MRN: [id number] Date of Birth: 1956/01/12  [facility] CM/SW Contact:    Sharin Mons, RN Phone Number: 02/04/2022, 8:26 AM  Clinical Narrative:             -s/p OPEN TREATMENT OF RIGHT PILON ANKLE FRACTURE and  ORIF LEFT CLAVICULAR FRACTURE, 1/9           Admitted s/p fall, suffered a left clavicle fx and right ankle fx. From home with mom. 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 States PTA independent with ADL's, no DME usage.   PT/OT evaluations pending ....  TOC team following and will continue to assist with needs.  Expected Discharge Plan: [place] Barriers to Discharge: Continued Medical Work up   Patient Goals and CMS Choice            Expected Discharge Plan and Services   Discharge Planning Services: CM Consult   Living arrangements for the past 2 months: [place]                                      Prior Living Arrangements/Services Living arrangements for the past 2 months: Single Family Home Lives with:: Parents (caretaker for mom with dementia) Patient language and need for interpreter reviewed:: Yes Do you feel safe going back to the place where you live?: Yes      Need for Family Participation in Patient Care: Yes (Comment) Care giver support system in place?: No (comment)   Criminal Activity/Legal Involvement Pertinent to Current Situation/Hospitalization: No - Comment as needed  Activities of Daily Living Home Assistive Devices/Equipment: Bedside commode/3-in-1, Other (Comment) (Knee scooter) ADL Screening (condition at time of admission) Patient's cognitive ability adequate to safely complete daily activities?: No Is the patient deaf or have difficulty hearing?: No Does the patient have difficulty seeing, even when wearing glasses/contacts?: Yes (prosthetic rt eye) Does the  patient have difficulty concentrating, remembering, or making decisions?: No Patient able to express need for assistance with ADLs?: No Does the patient have difficulty dressing or bathing?: No Independently performs ADLs?: Yes (appropriate for developmental age) Does the patient have difficulty walking or climbing stairs?: Yes Weakness of Legs: Right Weakness of Arms/Hands: Left  Permission Sought/Granted                  Emotional Assessment Appearance:: Appears stated age Attitude/Demeanor/Rapport: Engaged Affect (typically observed): Accepting Orientation: : Oriented to Self, Oriented to Place, Oriented to  Time, Oriented to Situation Alcohol / Substance Use: Not Applicable Psych Involvement: No (comment)  Admission diagnosis:  Closed right ankle fracture [S82.891A] Patient Active Problem List   Diagnosis Date Noted   Closed right ankle fracture 02/03/2022   Prolapse of female pelvic organs 06/13/2020   Bladder prolapse, female, acquired 02/02/2020   Raynaud's disease without gangrene 02/10/2019   Acute pain of left knee 03/23/2018   Gallstones 01/07/2018   ADHD (attention deficit hyperactivity disorder), inattentive type 11/24/2016   Onychomycosis due to dermatophyte 08/23/2015   Arthralgia of multiple joints 06/28/2015   Liver fibrosis 04/02/2015   Radiculopathy 12/12/2014   Anemia due to other cause 10/11/2014   Substance abuse in remission ([place]) 08/28/2014   Chronic hepatitis C without hepatic coma ([place]) 07/06/2014   MRSA PNEUMONIA 10/01/2008   MUSCULOSKELETAL PAIN  09/27/2008   ALLERGIC RHINITIS 04/12/2008   CELLULITIS AND ABSCESS OF FACE 04/12/2008   PCP:  Laurey Morale, MD Pharmacy:   [facility] [place] [address] Phone: [phone] Fax: [phone]     Social Determinants of Health (SDOH) Social History: SDOH Screenings   Food Insecurity: No Food Insecurity (08/14/2021)  Housing: Low Risk   (08/14/2021)  Transportation Needs: No Transportation Needs (08/14/2021)  Alcohol Screen: Low Risk  (08/14/2021)  Depression (PHQ2-9): Low Risk  (05/01/2021)  Financial Resource Strain: Low Risk  (08/14/2021)  Physical Activity: Sufficiently Active (08/14/2021)  Social Connections: Moderately Isolated (08/14/2021)  Stress: No Stress Concern Present (08/14/2021)  Tobacco Use: Low Risk  (02/03/2022)   SDOH Interventions:     Readmission Risk Interventions     No data to display

## 2022-02-05 DIAGNOSIS — S42032A Displaced fracture of lateral end of left clavicle, initial encounter for closed fracture: Secondary | ICD-10-CM | POA: Diagnosis not present

## 2022-02-05 DIAGNOSIS — Z981 Arthrodesis status: Secondary | ICD-10-CM | POA: Diagnosis not present

## 2022-02-05 DIAGNOSIS — I7300 Raynaud's syndrome without gangrene: Secondary | ICD-10-CM | POA: Diagnosis not present

## 2022-02-05 DIAGNOSIS — M1990 Unspecified osteoarthritis, unspecified site: Secondary | ICD-10-CM | POA: Diagnosis not present

## 2022-02-05 DIAGNOSIS — Z803 Family history of malignant neoplasm of breast: Secondary | ICD-10-CM | POA: Diagnosis not present

## 2022-02-05 DIAGNOSIS — M810 Age-related osteoporosis without current pathological fracture: Secondary | ICD-10-CM | POA: Diagnosis not present

## 2022-02-05 DIAGNOSIS — S82841A Displaced bimalleolar fracture of right lower leg, initial encounter for closed fracture: Secondary | ICD-10-CM | POA: Diagnosis not present

## 2022-02-05 DIAGNOSIS — F909 Attention-deficit hyperactivity disorder, unspecified type: Secondary | ICD-10-CM | POA: Diagnosis not present

## 2022-02-05 DIAGNOSIS — W010XXA Fall on same level from slipping, tripping and stumbling without subsequent striking against object, initial encounter: Secondary | ICD-10-CM | POA: Diagnosis present

## 2022-02-05 DIAGNOSIS — Z96612 Presence of left artificial shoulder joint: Secondary | ICD-10-CM | POA: Diagnosis not present

## 2022-02-05 DIAGNOSIS — Z79899 Other long term (current) drug therapy: Secondary | ICD-10-CM | POA: Diagnosis not present

## 2022-02-05 DIAGNOSIS — Z96611 Presence of right artificial shoulder joint: Secondary | ICD-10-CM | POA: Diagnosis not present

## 2022-02-05 DIAGNOSIS — S82891A Other fracture of right lower leg, initial encounter for closed fracture: Secondary | ICD-10-CM | POA: Diagnosis not present

## 2022-02-05 NOTE — Progress Notes (Signed)
     [name] is a 67 y.o. female   Orthopaedic diagnosis:  Right intra-articular distal tibia fracture with associated fibula and medial malleolus fracture status post ORIF 02/03/2022 by Dr. Lucia Gaskins   Left clavicle fracture status post ORIF 02/03/2021 by Dr. Tamera Punt  Subjective: Patient resting well in chair.  She is still requiring quite a bit of IV Dilaudid to control her pain.  Otherwise, she tells me she is doing well.  She has worked with PT/OT. Passing gas.  Urinating well.  Tolerating p.o. well.  No new concerns.  She is hopeful for disposition home with home health.   Objectyive: Vitals:   02/05/22 0408 02/05/22 0719  BP: 124/71 124/69  Pulse: 90 83  Resp: 17 17  Temp: 98.3 F (36.8 C) 98.4 F (36.9 C)  SpO2: 92% 93%     Exam: Awake and alert Respirations even and unlabored No acute distress  Examination of the right lower extremity demonstrates well-fitted clean, dry lower extremity splint.  There is aging ecchymosis about exposed toes.  Appropriate postoperative swelling in the toes and forefoot.  No significant tenderness palpation distal or proximal to the splint.  She has no discomfort with passive stretch of the toes.  Proximal calf is soft and nontender.  Exposed skin is otherwise benign.  Capillary refill is brisk.  Toes are warm and well-perfused with intact sensation.   Examination of the left shoulder shows clean, dry, intact surgical dressing.  The dressing was not removed.  There is no gross deformity.  Range of motion at the shoulder not assessed.  She able to flex and extend at the elbow.  She has good sensation and motor function in the hand.  Palpable +2 radial pulse.  Assessment: Postop day 2 status post the above, doing well  Plan: -Left shoulder -Nonweightbearing left upper extremity in sling.  Okay for gentle range of motion at the elbow and hand. -Keep dressing clean, dry, and intact   Right ankle -Nonweightbearing right lower  extremity.  Okay for gentle range of motion at the knee and hip -Elevate right lower extremity -Keep splint clean, dry, and intact  DVT prophylaxis: Lovenox while inpatient, plan for aspirin 325 daily x 1 month outpatient   Continue pain regimen of oxycodone and IV Dilaudid as needed.  Discussed weaning from IV Dilaudid tonight and into tomorrow as tolerated.  Hopefully we can get her pain controlled on oral regimen for discharge.   We did discuss disposition again.  Reports she is doing very well working with PT/OT with transfers and is able to maintain nonweightbearing restrictions. She prefers discharge home with home health services over SNF.  Discussed continuing to work with the therapist to make sure she is comfortable with discharge home, ADLs, and using 1 arm drive wheelchair while maintaining her nonweightbearing restrictions.  Hopeful for discharge tomorrow or Saturday.   Plan for outpatient follow-up 2 weeks from surgery with Dr. Lucia Gaskins for her right ankle and Dr. Tamera Punt for her left shoulder for reevaluation, suture removal if appropriate, and repeat x-rays.  She will likely go into a nonweightbearing short leg cast with regard to her right ankle   Beverly Stecher J. Martinique, PA-C

## 2022-02-05 NOTE — Progress Notes (Signed)
Occupational Therapy Treatment Patient Details Name: Tanner Vigna MRN: [id number] DOB: 1955-07-20 Today's Date: 02/05/2022   History of present illness Pt is a 67 y/o female who presents s/p R pilon ankle fracture with associated lateral malleolus fracture, and L clavicle fracture. She is now s/p ORIF of both fractures and is NWB on the RLE and LUE. PMH significant for ADHD, ARDS, Hepatitis, osteoporosis, PNA, R prosthetic eye, Raynaud's disease. ACDF 2016, L TSA 2021.   OT comments  Pt in recliner upon therapy arrival and agreeable to participate OT treatment session. Session focused on patient education related to sling wearing/management, positioning of UE when seated and sleeping, HEP, shoulder precautions, home environment including ability to maneuver with w/c, functional transfers to/from [facility]. Pt reports that she has a tub bench and BSC that she can use at home. Participated in hands on transfer training with education provided on safety and transfer techniques while following WB restrictions. Pt was able to verbalize and/or demonstrate understanding. Pt reports that she has hired a caregiver to stay with her Mother at home while she is in the hospital. She states that she will be there for at least 2 weeks when she is discharged and will probably be able to stay longer. I do recommend HHOT upon returning home to further assess home environment and provide any additional education on how to safely perform BADL tasks.  Next session: Education and practice tub/shower t/f using tub bench.    Recommendations for follow up therapy are one component of a multi-disciplinary discharge planning process, led by the attending physician.  Recommendations may be updated based on patient status, additional functional criteria and insurance authorization.    Follow Up Recommendations  Home health OT     Assistance Recommended at Discharge PRN  Patient can return home with the following  A little  help with walking and/or transfers;A little help with bathing/dressing/bathroom;Help with stairs or ramp for entrance;Assist for transportation;Assistance with cooking/housework   Equipment Recommendations  None recommended by OT       Precautions / Restrictions Precautions Precautions: Fall;Shoulder Type of Shoulder Precautions: post-op left ORIF clavicle fx Shoulder Interventions: Shoulder sling/immobilizer;At all times;Off for dressing/bathing/exercises Precaution Booklet Issued: Yes (comment) Precaution Comments: verbal education provided with handout Required Braces or Orthoses: Sling;Splint/Cast Splint/Cast: RLE, LUE - sling Restrictions Weight Bearing Restrictions: Yes LUE Weight Bearing: Non weight bearing RLE Weight Bearing: Non weight bearing       Mobility Bed Mobility Overal bed mobility:  (pt up in recliner upon therapy entry)     Patient Response: Cooperative  Transfers Overall transfer level: Needs assistance Equipment used: None Transfers: Sit to/from Stand, Bed to chair/wheelchair/BSC Sit to Stand: Min guard   Squat pivot transfers: Min guard       General transfer comment: To simulate home environment all transfer completed without drop arm recliner and drop arm BSC feature requiring pt to transfer over arm rests. Education provided on hand placement, safety, and having caregiver there to provide stand by assist in case of LOB.     Balance Overall balance assessment: Needs assistance Sitting-balance support: Feet supported, No upper extremity supported Sitting balance-Leahy Scale: Normal Sitting balance - Comments: sitting in recliner and on toilet   Standing balance support: Single extremity supported, During functional activity Standing balance-Leahy Scale: Poor Standing balance comment: Pt requires 1 UE support for balance during transfers       ADL either performed or assessed with clinical judgement   ADL Overall ADL's : Needs  assistance/impaired     Grooming: Oral care;Wash/dry hands;Sitting;Supervision/safety;Adhering to UE precautions     Toilet Transfer: Min guard;Regular Toilet;Cueing for safety;Squat-pivot;Grab bars Toilet Transfer Details (indicate cue type and reason): Also completed toilet transfer from recliner to [facility] to practice sequencing and set-up at home. Pt transferred to [facility] towards right side; returned to recliner going towards left side. SBA to [facility] and Min guard provided returning to recliner. Toileting- Clothing Manipulation and Hygiene: Supervision/safety;Sitting/lateral lean                Cognition Arousal/Alertness: Awake/alert Behavior During Therapy: WFL for tasks assessed/performed Overall Cognitive Status: Within Functional Limits for tasks assessed            Exercises Hand Exercises Forearm Supination: AROM, Left, 5 reps, Seated Forearm Pronation: AROM, Left, 5 reps, Seated Wrist Flexion: AROM, Left, 5 reps, Seated Wrist Extension: AROM, Left, 5 reps, Seated Digit Composite Flexion: AROM, Left, 5 reps, Seated Composite Extension: AROM, Left, 5 reps, Seated Other Exercises Other Exercises: Education provided on sling maintence, proper UE positioning in sling, seated in recliner, and discussed relaxing upper traps when UE is in the sling.    Shoulder Instructions Shoulder Instructions Donning/doffing shirt without moving shoulder: Patient able to independently direct caregiver;Supervision/safety Method for sponge bathing under operated UE: Supervision/safety;Patient able to independently direct caregiver Donning/doffing sling/immobilizer: Minimal assistance;Patient able to independently direct caregiver Correct positioning of sling/immobilizer: Minimal assistance;Patient able to independently direct caregiver ROM for elbow, wrist and digits of operated UE: Independent Sling wearing schedule (on at all times/off for ADL's): Independent Proper positioning of operated UE  when showering: Set-up;Patient able to independently direct caregiver Positioning of UE while sleeping: Minimal assistance;Patient able to independently direct caregiver     General Comments Toes on RLE bruised.    Pertinent Vitals/ Pain       Pain Assessment Pain Assessment: Faces Faces Pain Scale: Hurts little more Pain Location: RLE>LUE Pain Descriptors / Indicators: Operative site guarding, Sore, Aching Pain Intervention(s): Monitored during session, Ice applied         Frequency  Min 2X/week        Progress Toward Goals  OT Goals(current goals can now be found in the care plan section)  Progress towards OT goals: Progressing toward goals     Plan Discharge plan remains appropriate;Frequency remains appropriate       AM-PAC OT "6 Clicks" Daily Activity     Outcome Measure   Help from another person eating meals?: None Help from another person taking care of personal grooming?: None Help from another person toileting, which includes using toliet, bedpan, or urinal?: A Little Help from another person bathing (including washing, rinsing, drying)?: A Little Help from another person to put on and taking off regular upper body clothing?: A Little Help from another person to put on and taking off regular lower body clothing?: A Little 6 Click Score: 20    End of Session Equipment Utilized During Treatment: Gait belt  OT Visit Diagnosis: Muscle weakness (generalized) (M62.81);History of falling (Z91.81)   Activity Tolerance Patient tolerated treatment well   Patient Left in chair;with call bell/phone within reach;with chair alarm set;with family/visitor present           Time: 1000-1053 OT Time Calculation (min): 53 min  Charges: OT General Charges $OT Visit: 1 Visit OT Treatments $Self Care/Home Management : 23-37 mins $Neuromuscular Re-education: 8-22 mins $Therapeutic Exercise: 8-22 mins  [employer], OTR/L,CBIS  Supplemental OT - [facility] and  [facility] Secure  Chat Preferred    Ellias Mcelreath, Clarene Duke 02/05/2022, 11:47 AM

## 2022-02-05 NOTE — Progress Notes (Signed)
Physical Therapy Treatment Patient Details Name: [name] MRN: [id number] DOB: 05-21-1955 Today's Date: 02/05/2022   History of Present Illness Pt is a 67 y/o female who presents s/p R pilon ankle fracture with associated lateral malleolus fracture, and L clavicle fracture. She is now s/p ORIF of both fractures and is NWB on the RLE and LUE. PMH significant for ADHD, ARDS, Hepatitis, osteoporosis, PNA, R prosthetic eye, Raynaud's disease. ACDF 2016, L TSA 2021.    PT Comments    Focus of session was wheelchair training. Pt's wheelchair was delivered during session and therapist set up and adjusted leg rest length. Pt successfully transferred from rehab wheelchair to her new wheelchair and was able to utilize one arm drive to steer wheelchair without assist. She completed ~250' in wheelchair total. Continue to feel pt will be successful at home with caregiver assistance and use of wheelchair instead of the knee scooter. Will continue to follow.     Recommendations for follow up therapy are one component of a multi-disciplinary discharge planning process, led by the attending physician.  Recommendations may be updated based on patient status, additional functional criteria and insurance authorization.  Follow Up Recommendations  Outpatient PT (When appropriate per post-op protocol)     Assistance Recommended at Discharge Intermittent Supervision/Assistance  Patient can return home with the following A little help with walking and/or transfers;A little help with bathing/dressing/bathroom;Assistance with cooking/housework;Help with stairs or ramp for entrance;Assist for transportation   Equipment Recommendations  Wheelchair (measurements PT);Wheelchair cushion (measurements PT) (One arm drive with elevating leg rests,)    Recommendations for Other Services       Precautions / Restrictions Precautions Precautions: Fall;Shoulder Type of Shoulder Precautions: post-op left ORIF  clavicle fx Shoulder Interventions: Shoulder sling/immobilizer;At all times;Off for dressing/bathing/exercises Precaution Booklet Issued: Yes (comment) Precaution Comments: verbal education provided with handout Required Braces or Orthoses: Sling;Splint/Cast Splint/Cast: RLE, LUE - sling Restrictions Weight Bearing Restrictions: Yes LUE Weight Bearing: Non weight bearing RLE Weight Bearing: Non weight bearing     Mobility  Bed Mobility               General bed mobility comments: Pt was received sitting up in the recliner.    Transfers Overall transfer level: Needs assistance Equipment used: None Transfers: Sit to/from Stand, Bed to chair/wheelchair/BSC Sit to Stand: Supervision     Squat pivot transfers: Supervision     General transfer comment: Pt practiced transferring to/from the wheelchair without the arm rest removed to simulate home. Pt was educated on placing/removing R leg rest and adjusting height of elevation.    Ambulation/Gait               General Gait Details: Deferred due to weight bearing restrictions   Theme park manager mobility: Yes Wheelchair propulsion: Right upper extremity, Left lower extremity Wheelchair parts: Needs assistance Distance: 250 Wheelchair Assistance Details (indicate cue type and reason): Pt initially in regular wheelchair that required 2 arm drive. Pt was able to propel with LLE and RUE and therapist assisted with steering. During w/c training pt's wheelchair was delivered and therapist set up and adjusted leg rest length. Pt transferred to new wheelchair (one arm drive) and was able to propel and steer herself (~50 trial).  Modified Rankin (Stroke Patients Only)       Balance Overall balance assessment: Needs assistance Sitting-balance support: Feet supported, No upper extremity supported Sitting balance-Leahy  Scale: Normal Sitting balance - Comments:  sitting in recliner and on toilet   Standing balance support: Single extremity supported, During functional activity Standing balance-Leahy Scale: Poor Standing balance comment: Pt requires 1 UE support for balance during transfers                            Cognition Arousal/Alertness: Awake/alert Behavior During Therapy: WFL for tasks assessed/performed Overall Cognitive Status: Within Functional Limits for tasks assessed                                          Exercises      General Comments General comments (skin integrity, edema, etc.): Toes on RLE bruised.      Pertinent Vitals/Pain Pain Assessment Pain Assessment: Faces Faces Pain Scale: Hurts little more Pain Location: RLE>LUE Pain Descriptors / Indicators: Operative site guarding, Sore, Aching Pain Intervention(s): Limited activity within patient's tolerance, Monitored during session, Repositioned    Home Living                          Prior Function            PT Goals (current goals can now be found in the care plan section) Acute Rehab PT Goals Patient Stated Goal: Return home at d/c PT Goal Formulation: With patient Time For Goal Achievement: 02/11/22 Potential to Achieve Goals: Good Progress towards PT goals: Progressing toward goals    Frequency    Min 4X/week      PT Plan Current plan remains appropriate    Co-evaluation              AM-PAC PT "6 Clicks" Mobility   Outcome Measure  Help needed turning from your back to your side while in a flat bed without using bedrails?: None Help needed moving from lying on your back to sitting on the side of a flat bed without using bedrails?: None Help needed moving to and from a bed to a chair (including a wheelchair)?: None Help needed standing up from a chair using your arms (e.g., wheelchair or bedside chair)?: A Little Help needed to walk in hospital room?: A Little Help needed climbing 3-5 steps  with a railing? : A Little 6 Click Score: 21    End of Session Equipment Utilized During Treatment: Gait belt Activity Tolerance: Patient tolerated treatment well Patient left: in chair;with call bell/phone within reach;with chair alarm set;with family/visitor present Nurse Communication: Mobility status PT Visit Diagnosis: Unsteadiness on feet (R26.81);Pain;Other abnormalities of gait and mobility (R26.89) Pain - Right/Left:  (bilateral) Pain - part of body: Shoulder;Ankle and joints of foot;Leg     Time: 1111-1150 PT Time Calculation (min) (ACUTE ONLY): 39 min  Charges:  $Wheel Chair Management: 38-52 mins                     [name], PT, DPT Acute Rehabilitation Services Secure Chat Preferred Office: [phone]    Thelma Comp 02/05/2022, 3:16 PM

## 2022-02-06 ENCOUNTER — Other Ambulatory Visit (HOSPITAL_COMMUNITY): Payer: Self-pay

## 2022-02-06 MED ORDER — OXYCODONE HCL 5 MG PO TABS
10.0000 mg | ORAL_TABLET | ORAL | 0 refills | Status: DC | PRN
  Filled 2022-02-06: qty 60, 4d supply, fill #0

## 2022-02-06 MED ORDER — OXYCODONE HCL 5 MG PO TABS
5.0000 mg | ORAL_TABLET | ORAL | 0 refills | Status: DC | PRN
  Filled 2022-02-06: qty 40, 4d supply, fill #0

## 2022-02-06 MED ORDER — METHOCARBAMOL 500 MG PO TABS
500.0000 mg | ORAL_TABLET | Freq: Four times a day (QID) | ORAL | 0 refills | Status: DC | PRN
  Filled 2022-02-06: qty 40, 10d supply, fill #0

## 2022-02-06 MED ORDER — METHOCARBAMOL 500 MG PO TABS
500.0000 mg | ORAL_TABLET | Freq: Four times a day (QID) | ORAL | 0 refills | Status: DC | PRN
  Filled 2022-02-06 (×2): qty 40, 10d supply, fill #0

## 2022-02-06 MED ORDER — ASPIRIN 325 MG PO TBEC: 325.0000 mg | DELAYED_RELEASE_TABLET | Freq: Every day | ORAL | 0 refills | Status: DC

## 2022-02-06 NOTE — Op Note (Addendum)
This operative note is addended indicate preoperative diagnosis of right displaced medial malleolus fracture and procedure to include open reduction internal fixation of right medial malleolus fracture.   [name] female 67 y.o. 02/03/2022  PreOperative Diagnosis: Right pilon ankle fracture with associated lateral malleolus fracture Right displaced medial malleolus fracture   PostOperative Diagnosis: Same  PROCEDURE: Open reduction of fracture of the weightbearing surface of the distal tibia with fibula fixation Open reduction internal fixation of right medial malleolus fracture  SURGEON: Melony Overly, MD  ASSISTANT: Jesse Martinique, PA-C was necessary for patient positioning, prep, drape, assistance with fracture reduction and placement of hardware.  ANESTHESIA: General endotracheal tube with peripheral nerve block  FINDINGS: Displaced intra-articular fracture of the distal tibia with associated fibular fracture and separate medial malleolus fracture  IMPLANTS: Arthrex posterior lateral tibial locking plate, distal fibular locking plate, 4.0 mm cannulated screws  INDICATIONS:66 y.o. femalesustained the above injury after a fall.  She had a known clavicle fracture and lost her balance and fell and broke her ankle.  CT scan revealed large intra-articular fracture of the distal tibia with associated fibula and medial malleolus fracture.  Given the amount of displacement and instability pattern through her ankle she was indicated for surgery.   Patient understood the risks, benefits and alternatives to surgery which include but are not limited to wound healing complications, infection, nonunion, malunion, need for further surgery as well as damage to surrounding structures. They also understood the potential for continued pain in that there were no guarantees of acceptable outcome After weighing these risks the patient opted to proceed with surgery.  PROCEDURE: Patient was  identified in the preoperative holding area.  The right leg was marked by myself.  Consent was signed by myself and the patient.  Block was performed by anesthesia in the preoperative holding area.  Patient was taken to the operative suite and general anesthesia was induced without difficulty.  She was then placed in the prone position.All bony prominences were well padded.  Tourniquet was placed on the operative thigh.  Preoperative antibiotics were given. The extremity was prepped and draped in the usual sterile fashion and surgical timeout was performed.  The limb was elevated and the tourniquet was inflated to 250 mmHg.  We began by making a posterior lateral approach to the tibia.  This was done through the interval between the peroneal tendons and the flexor hallux longus muscle belly.  The muscle was elevated off the posterior aspect of the tibia.  The fibula fracture was identified as well and mobilized.  The tibia fracture was identified and mobilized with a Soil scientist.  Would handle elevator was also used to fully mobilize of the fracture fragments.  Hematoma tissue and fibrous tissue within the fracture was remove the Ronger and a curette.  Then the fracture was reduced and held provisionally with K wire fixation.  There is difficulty in adequately reducing the fracture and therefore we turned our attention to the fibula.  The fibula was shortened and displaced.  We are able to gain acceptable reduction of the fibula and held provisionally with a K wire.  We then turned our attention back to the tibia.  The fracture was reduced under direct visualization and the joint surfaces were confirmed to be adequately reduced fluoroscopically.  Then K wire was used to provisionally fixate the fracture.  A posterior lateral locking plate was placed without difficulty.  The screw lengths were confirmed to be appropriate on x-ray.  Maintenance  of reduction of the joint surfaces were also confirmed.  We then  proceeded to place a distal fibular locking plate on the fibula.  This was done in standard fashion.  The fracture was not amenable to lag screw fixation due to comminution.  We then turned our attention to the medial ankle.  An incision was made overlying the medial malleolus.  There is fracturing of the anterior colliculus of the medial malleolus there was also intra-articular.  The fracture was mobilized and tissue within the fracture site was removed with the Roger and curette.  Then the fracture was reduced under direct visualization and held provisionally with a pointed reduction forceps.  K wire was placed within the fracture and a single partially threaded cannulated screw was placed across the fracture to stabilize it.  Then the K wires were removed.  Ankle was stressed and found to be stable with manual testing.  Then final fluoroscopic images were obtained.  The wound was irrigated with normal saline.  Wound was closed in a layered fashion using 3-0 Monocryl and 3-0 nylon suture. Tourniquet was released.  Soft dressing was placed.  Splint was placed.  She was then placed back on her Dunn bed in the supine position.  The case was then handed over to Dr. Tamera Punt to fix her clavicle fracture.    POST OPERATIVE INSTRUCTIONS: Nonn weightbearing to operative extremity Admit for observation Follow-up in 2 weeks for suture removal and placement of a short leg cast DVT prophylaxis on discharge  TOURNIQUET TIME: less than 2 hours  BLOOD LOSS:  Minimal         DRAINS: none         SPECIMEN: none       COMPLICATIONS:  * No complications entered in OR log *         Disposition: PACU - hemodynamically stable.         Condition: stable

## 2022-02-06 NOTE — Progress Notes (Signed)
Occupational Therapy Treatment Patient Details Name: Kharisma Glasner MRN: [id number] DOB: [birth year] Today's Date: 02/06/2022   History of present illness Pt is a 67 y/o female who presents s/p R pilon ankle fracture with associated lateral malleolus fracture, and L clavicle fracture. She is now s/p ORIF of both fractures and is NWB on the RLE and LUE. PMH significant for ADHD, ARDS, Hepatitis, osteoporosis, PNA, R prosthetic eye, Raynaud's disease. ACDF 2016, L TSA 2021.   OT comments  Pt in recliner upon therapy arrival and agreeable to participate in OT treatment session. Session focused on functional transfers including toilet/BSC transfer and tub/shower transfer in addition to w/c management and toileting. Pt was educated on tub/shower transfer utilizing a tub bench. Pt provided verbal instruction and visual demonstration with pt verbalizing and demonstrating understanding. Elastic shoelace provided for shoes to increase LB dressing ability. Education provided on use of gait belt as leg lifter for RLE. Pt able to demonstrate carry over of education. All education complete and questions addressed. No further acute OT needs at this time.    Recommendations for follow up therapy are one component of a multi-disciplinary discharge planning process, led by the attending physician.  Recommendations may be updated based on patient status, additional functional criteria and insurance authorization.    Follow Up Recommendations  Home health OT     Assistance Recommended at Discharge PRN  Patient can return home with the following  A little help with walking and/or transfers;A little help with bathing/dressing/bathroom;Help with stairs or ramp for entrance;Assist for transportation;Assistance with cooking/housework   Equipment Recommendations  None recommended by OT       Precautions / Restrictions Precautions Precautions: Fall;Shoulder Type of Shoulder Precautions: post-op left ORIF  clavicle fx Shoulder Interventions: Shoulder sling/immobilizer;At all times;Off for dressing/bathing/exercises Precaution Booklet Issued: Yes (comment) Precaution Comments: verbal education provided with handout Required Braces or Orthoses: Sling;Splint/Cast Splint/Cast: RLE, LUE - sling Restrictions Weight Bearing Restrictions: Yes LUE Weight Bearing: Non weight bearing RLE Weight Bearing: Non weight bearing       Mobility Bed Mobility Overal bed mobility:  (in recliner upon therapy arrival)     Patient Response: Cooperative  Transfers Overall transfer level: Needs assistance Equipment used: None Transfers: Sit to/from Stand, Bed to chair/wheelchair/BSC Sit to Stand: Supervision   Squat pivot transfers: Supervision       General transfer comment: Provided education on w/c management such as flip back arm rests, managing foot rests, and removal of extended break handles.     Balance Overall balance assessment: Needs assistance Sitting-balance support: Feet supported, No upper extremity supported Sitting balance-Leahy Scale: Normal Sitting balance - Comments: sitting in recliner and on toilet   Standing balance support: Single extremity supported, During functional activity Standing balance-Leahy Scale: Poor Standing balance comment: Pt requires 1 UE support for balance during transfers       ADL either performed or assessed with clinical judgement   ADL       Toilet Transfer: Supervision/safety;BSC/3in1;Stand-pivot   Toileting- Clothing Manipulation and Hygiene: Set up;Sitting/lateral lean   Tub/ Shower Transfer: Tub transfer;Min guard;Cueing for sequencing;Cueing for safety;Squat-pivot;Tub bench            Cognition Arousal/Alertness: Awake/alert Behavior During Therapy: WFL for tasks assessed/performed Overall Cognitive Status: Within Functional Limits for tasks assessed                   Pertinent Vitals/ Pain       Pain Assessment Pain  Assessment: Faces Faces Pain  Scale: Hurts little more Pain Location: RLE>LUE Pain Descriptors / Indicators: Operative site guarding, Sore, Aching Pain Intervention(s): Monitored during session, Limited activity within patient's tolerance      Progress Toward Goals  OT Goals(current goals can now be found in the care plan section)  Progress towards OT goals: Goals met/education completed, patient discharged from [facility] All goals met and education completed, patient discharged from [facility] OT "6 Clicks" Daily Activity     Outcome Measure   Help from another person eating meals?: None Help from another person taking care of personal grooming?: None Help from another person toileting, which includes using toliet, bedpan, or urinal?: A Little Help from another person bathing (including washing, rinsing, drying)?: A Little Help from another person to put on and taking off regular upper body clothing?: A Little Help from another person to put on and taking off regular lower body clothing?: A Little 6 Click Score: 20    End of Session    OT Visit Diagnosis: Muscle weakness (generalized) (M62.81);History of falling (Z91.81)   Activity Tolerance Patient tolerated treatment well   Patient Left in chair;with call bell/phone within reach;with chair alarm set;with family/visitor present;Other (comment) (Pt requested to remain in w/c after session)           Time: 8338-2505 OT Time Calculation (min): 46 min  Charges: OT General Charges $OT Visit: 1 Visit OT Treatments $Self Care/Home Management : 38-52 mins  Ailene Ravel, OTR/L,CBIS  Supplemental OT - [facility] and [facility] Secure Chat Preferred    [place], Clarene Duke 02/06/2022, 11:55 AM

## 2022-02-06 NOTE — Progress Notes (Signed)
PATIENT ID: [name]  MRN: [id number]  DOB/AGE:  07-01-1955 / 67 y.o.  3 Days Post-Op Procedure(s) (LRB): OPEN TREATMENT OF RIGHT PILON ANKLE FRACTURE INCLUDING LATERAL MALLEOLUS FRACTURE (Right) POSSIBLE OPEN TREATMENT RIGHT SYNDESMOSIS (Right) OPEN REDUCTION INTERNAL FIXATION (ORIF) LEFT CLAVICULAR FRACTURE WITH CORACOLAVICULAR FIXATION (Left)  Subjective: Patient reports that she did have some more pain in the right ankle last night, overnight. She reports achiness in the left shoulder.  No c/o chest pain or SOB.    Objective: Vital signs in last 24 hours: Temp:  [97.8 F (36.6 C)-99.2 F (37.3 C)] 98.2 F (36.8 C) (01/12 0755) Pulse Rate:  [65-97] 65 (01/12 0755) Resp:  [17-20] 18 (01/12 0755) BP: (106-125)/(65-71) 125/65 (01/12 0755) SpO2:  [92 %-96 %] 96 % (01/12 0755)  Intake/Output from previous day: 01/11 0701 - 01/12 0700 In: 120 [P.O.:120] Out: -    Recent Labs    02/03/22 1027  HGB 13.9   Recent Labs    02/03/22 1027  WBC 4.7  RBC 4.14  HCT 40.1  PLT 212   Recent Labs    02/03/22 1202  NA 135  K 3.8  CL 101  CO2 26  BUN 9  CREATININE 0.62  GLUCOSE 81  CALCIUM 8.8*     Physical Exam: Awake and alert Respirations even and unlabored No acute distress   Examination of the right lower extremity demonstrates well-fitted clean, dry lower extremity splint.  There is aging ecchymosis about exposed toes.  Appropriate postoperative swelling in the toes and forefoot.  No significant tenderness palpation distal or proximal to the splint.  She has no discomfort with passive stretch of the toes. Proximal calf is soft and nontender.  Exposed skin is otherwise benign.  Capillary refill is brisk.  Toes are warm and well-perfused with intact sensation.   Examination of the left shoulder shows clean, dry, intact surgical dressing.  The dressing was not removed.  There is no gross deformity.  Range of motion at the shoulder not assessed.  She able to  flex and extend at the elbow.  She has good sensation and motor function in the hand.  Palpable +2 radial pulse.  Assessment/Plan: 3 Days Post-Op Procedure(s) (LRB): OPEN TREATMENT OF RIGHT PILON ANKLE FRACTURE INCLUDING LATERAL MALLEOLUS FRACTURE (Right) POSSIBLE OPEN TREATMENT RIGHT SYNDESMOSIS (Right) OPEN REDUCTION INTERNAL FIXATION (ORIF) LEFT CLAVICULAR FRACTURE WITH CORACOLAVICULAR FIXATION (Left)   -Left shoulder -Nonweightbearing left upper extremity in sling.  Okay for gentle range of motion at the elbow and hand. -Keep dressing clean, dry, and intact   Right ankle -Nonweightbearing right lower extremity.  Okay for gentle range of motion at the knee and hip -Elevate right lower extremity -Keep splint clean, dry, and intact   DVT prophylaxis: Lovenox while inpatient, plan for aspirin 325 daily x 1 month outpatient   Continue pain regimen of oxycodone and IV Dilaudid as needed.  Discussed continuing to wean down pain meds.   She is doing very well working with PT/OT with transfers and is able to maintain nonweightbearing restrictions. Plan for discharge home, ADLs, and using 1 arm drive wheelchair while maintaining her nonweightbearing restrictions.  Hopeful for discharge today or tomorrow.   Plan for outpatient follow-up 2 weeks from surgery with Dr. Lucia Gaskins for her right ankle and Dr. Tamera Punt for her left shoulder for reevaluation, suture removal if appropriate, and repeat x-rays.  She will likely go into a nonweightbearing short leg cast with regard to her right ankle   [employer]  Carmon Sails, PA-C 02/06/2022, 8:14 AM

## 2022-02-06 NOTE — TOC Progression Note (Addendum)
Transition of Care [facility]) - Progression Note    Patient Details  Name: [name] MRN: [id number] Date of Birth: [birth year]  Transition of Care [facility]) CM/SW Contact  Sharin Mons, RN Phone Number: 02/06/2022, 10:15 AM  Clinical Narrative:    Pt with potential d/c for today. PT to clear. Pt states has transportation to home. States friend will assist with care as needed once d/c to home.  [facility] made aware of potentia d/c . Pt without Rx med concerns.  TOC team  will continue to monitor ...  Expected Discharge Plan: [place] [facility] Barriers to Discharge: Other (must enter comment) (pending clearance from PT)  Expected Discharge Plan and Services   Discharge Planning Services: CM Consult   Living arrangements for the past 2 months: Single Family Home                 DME Arranged: Youth worker wheelchair with seat cushion DME Agency: [employer] Date DME Agency Contacted: 02/04/22 Time DME Agency Contacted: 82 Representative spoke with at DME Agency: Cyril Mourning HH Arranged: PT, OT HH Agency: [place] Date [place]: 02/05/22 Time [place]: 1210 Representative spoke with at [place]: Amy   Social Determinants of Health ([place]) Interventions SDOH Screenings   Food Insecurity: No Food Insecurity (08/14/2021)  Housing: Low Risk  (08/14/2021)  Transportation Needs: No Transportation Needs (08/14/2021)  Alcohol Screen: Low Risk  (08/14/2021)  Depression (PHQ2-9): Low Risk  (05/01/2021)  Financial Resource Strain: Low Risk  (08/14/2021)  Physical Activity: Sufficiently Active (08/14/2021)  Social Connections: Moderately Isolated (08/14/2021)  Stress: No Stress Concern Present (08/14/2021)  Tobacco Use: Low Risk  (02/03/2022)    Readmission Risk Interventions     No data to display

## 2022-02-06 NOTE — Progress Notes (Signed)
Physical Therapy Treatment Patient Details Name: [name] MRN: [id number] DOB: 1955-03-22 Today's Date: 02/06/2022   History of Present Illness Pt is a 67 y/o female who presents s/p R pilon ankle fracture with associated lateral malleolus fracture, and L clavicle fracture. She is now s/p ORIF of both fractures and is NWB on the RLE and LUE. PMH significant for ADHD, ARDS, Hepatitis, osteoporosis, PNA, R prosthetic eye, Raynaud's disease. ACDF 2016, L TSA 2021.    PT Comments    Pt progressing towards physical therapy goals. Was able to perform transfers and in room mobility in the wheelchair with modified independence to supervision for safety. Pt managing toilet transfers and participating in ADL tasks. Therapist assisted pt to get dressed in preparation for d/c home. Pt anticipates d/c this afternoon. Pt reports mobilizing around the unit x2 without assistance in her wheelchair earlier today. Pt is safe for d/c from a PT standpoint, as pt will have adequate assistance at home. However, will continue to follow while admitted to continue with PT POC.    Recommendations for follow up therapy are one component of a multi-disciplinary discharge planning process, led by the attending physician.  Recommendations may be updated based on patient status, additional functional criteria and insurance authorization.  Follow Up Recommendations  Outpatient PT (When appropriate per post-op protocol)     Assistance Recommended at Discharge Intermittent Supervision/Assistance  Patient can return home with the following A little help with walking and/or transfers;A little help with bathing/dressing/bathroom;Assistance with cooking/housework;Help with stairs or ramp for entrance;Assist for transportation   Equipment Recommendations  Wheelchair (measurements PT);Wheelchair cushion (measurements PT) (One arm drive with elevating leg rests,)    Recommendations for Other Services        Precautions / Restrictions Precautions Precautions: Fall;Shoulder Type of Shoulder Precautions: post-op left ORIF clavicle fx Shoulder Interventions: Shoulder sling/immobilizer;At all times;Off for dressing/bathing/exercises Precaution Booklet Issued: Yes (comment) Precaution Comments: verbal education provided with handout Required Braces or Orthoses: Sling;Splint/Cast Splint/Cast: RLE, LUE - sling Restrictions Weight Bearing Restrictions: Yes LUE Weight Bearing: Non weight bearing RLE Weight Bearing: Non weight bearing     Mobility  Bed Mobility               General bed mobility comments: Pt was received sitting up in the wheelchair    Transfers Overall transfer level: Needs assistance Equipment used: None Transfers: Sit to/from Stand, Bed to chair/wheelchair/BSC Sit to Stand: Supervision     Squat pivot transfers: Supervision     General transfer comment: Pt was able to transition wheelchair>BSC>wheelchair with supervision for safety. Pt maintained NWB well on RLE and LUE. Pt was able to stand to pull up underwear and pants with assist.    Ambulation/Gait               General Gait Details: Deferred due to weight bearing restrictions   Theme park manager mobility: Yes Wheelchair propulsion: Right upper extremity, Left lower extremity Wheelchair parts: Needs assistance Distance: 25 Wheelchair Assistance Details (indicate cue type and reason): Pt utilized wheelchair for in-room mobility and in bathroom. She did require assist to remove R leg rest to prepare for transfers and replace at end of session.  Modified Rankin (Stroke Patients Only)       Balance Overall balance assessment: Needs assistance Sitting-balance support: Feet supported, No upper extremity supported Sitting balance-Leahy Scale: Normal Sitting balance - Comments: sitting in recliner  and on toilet   Standing balance  support: Single extremity supported, During functional activity Standing balance-Leahy Scale: Poor Standing balance comment: Pt requires 1 UE support for balance during transfers                            Cognition Arousal/Alertness: Awake/alert Behavior During Therapy: WFL for tasks assessed/performed Overall Cognitive Status: Within Functional Limits for tasks assessed                                          Exercises General Exercises - Lower Extremity Ankle Circles/Pumps: 15 reps (toe wiggles)    General Comments        Pertinent Vitals/Pain Pain Assessment Pain Assessment: Faces Faces Pain Scale: Hurts a little bit Pain Location: RLE>LUE Pain Descriptors / Indicators: Operative site guarding, Sore, Aching Pain Intervention(s): Limited activity within patient's tolerance, Monitored during session, Repositioned    Home Living                          Prior Function            PT Goals (current goals can now be found in the care plan section) Acute Rehab PT Goals Patient Stated Goal: Discharge home today PT Goal Formulation: With patient Time For Goal Achievement: 02/11/22 Potential to Achieve Goals: Good Progress towards PT goals: Progressing toward goals    Frequency    Min 4X/week      PT Plan Current plan remains appropriate    Co-evaluation              AM-PAC PT "6 Clicks" Mobility   Outcome Measure  Help needed turning from your back to your side while in a flat bed without using bedrails?: None Help needed moving from lying on your back to sitting on the side of a flat bed without using bedrails?: None Help needed moving to and from a bed to a chair (including a wheelchair)?: None Help needed standing up from a chair using your arms (e.g., wheelchair or bedside chair)?: A Little Help needed to walk in hospital room?: A Little Help needed climbing 3-5 steps with a railing? : A Little 6 Click Score:  21    End of Session   Activity Tolerance: Patient tolerated treatment well Patient left: in chair;with call bell/phone within reach;with chair alarm set;with family/visitor present Nurse Communication: Mobility status PT Visit Diagnosis: Unsteadiness on feet (R26.81);Pain;Other abnormalities of gait and mobility (R26.89) Pain - Right/Left:  (bilateral) Pain - part of body: Shoulder;Ankle and joints of foot;Leg     Time: 6861-6837 PT Time Calculation (min) (ACUTE ONLY): 27 min  Charges:  $Therapeutic Activity: 8-22 mins $Wheel Chair Management: 8-22 mins                     [name], PT, DPT Acute Rehabilitation Services Secure Chat Preferred Office: [phone]    Thelma Comp 02/06/2022, 2:25 PM

## 2022-02-06 NOTE — Plan of Care (Signed)
  Problem: Clinical Measurements: Goal: Will remain free from infection Outcome: Progressing Goal: Respiratory complications will improve Outcome: Progressing   Problem: Activity: Goal: Risk for activity intolerance will decrease Outcome: Progressing   Problem: Elimination: Goal: Will not experience complications related to urinary retention Outcome: Progressing   Problem: Pain Managment: Goal: General experience of comfort will improve Outcome: Not Progressing

## 2022-02-09 ENCOUNTER — Encounter (HOSPITAL_COMMUNITY): Payer: Self-pay | Admitting: Orthopaedic Surgery

## 2022-02-09 NOTE — Discharge Summary (Signed)
Patient ID: [name] MRN: [id number] DOB/AGE: 01-29-1955 67 y.o.  Admit date: 02/03/2022 Discharge date: 02/09/2022  Admission Diagnoses:  Principal Problem:   Closed right ankle fracture Closed left clavicle fracture  Discharge Diagnoses:  Same  Past Medical History:  Diagnosis Date   ADHD (attention deficit hyperactivity disorder)    ARDS (adult respiratory distress syndrome) ([place]) 2007   Arthritis    Depression    Gallstones 01/07/2018   Ganglion cyst of dorsum of right wrist    Headache    Hepatitis    2004 non detectable now   MRSA (methicillin resistant Staphylococcus aureus) 2007   Osteoporosis    Pneumonia 2009   Prosthetic eye globe    right eye    Raynaud's disease     Surgeries: Procedure(s): OPEN TREATMENT OF RIGHT PILON ANKLE FRACTURE INCLUDING LATERAL MALLEOLUS FRACTURE POSSIBLE OPEN TREATMENT RIGHT SYNDESMOSIS OPEN REDUCTION INTERNAL FIXATION (ORIF) LEFT CLAVICULAR FRACTURE WITH CORACOLAVICULAR FIXATION on 02/03/2022   Consultants:   Discharged Condition: Improved  Hospital Course: [name] is an 66 y.o. female who was admitted 02/03/2022 for operative treatment ofClosed right ankle fracture. Patient has severe unremitting pain that affects sleep, daily activities, and work/hobbies. After pre-op clearance the patient was taken to the operating room on 02/03/2022 and underwent  Procedure(s): OPEN TREATMENT OF RIGHT PILON ANKLE FRACTURE INCLUDING LATERAL MALLEOLUS FRACTURE POSSIBLE OPEN TREATMENT RIGHT SYNDESMOSIS OPEN REDUCTION INTERNAL FIXATION (ORIF) LEFT CLAVICULAR FRACTURE WITH CORACOLAVICULAR FIXATION.    Patient was given perioperative antibiotics:  Anti-infectives (From admission, onward)    Start     Dose/Rate Route Frequency Ordered Stop   02/04/22 1000  valACYclovir (VALTREX) tablet 1,000 mg  Status:  Discontinued        1,000 mg Oral Daily 02/03/22 1825 02/06/22 2127   02/03/22 1915  ceFAZolin (ANCEF) IVPB 1 g/50 mL  premix        1 g 100 mL/hr over 30 Minutes Intravenous Every 6 hours 02/03/22 1825 02/04/22 0859   02/03/22 1400  ceFAZolin (ANCEF) IVPB 2g/100 mL premix  Status:  Discontinued        2 g 200 mL/hr over 30 Minutes Intravenous On call to O.R. 02/03/22 0955 02/03/22 1755   02/03/22 1000  ceFAZolin (ANCEF) IVPB 2g/100 mL premix        2 g 200 mL/hr over 30 Minutes Intravenous On call to O.R. 02/03/22 0955 02/03/22 1329        Patient was given sequential compression devices, early ambulation, and chemoprophylaxis to prevent DVT.  Patient benefited maximally from hospital stay and there were no complications.  She worked with OT/PT and arrangements were made for HHPT/HHOT.    Discharge Medications:   Allergies as of 02/06/2022   No Known Allergies      Medication List     STOP taking these medications    celecoxib 200 MG capsule Commonly known as: CeleBREX       TAKE these medications    amphetamine-dextroamphetamine 20 MG 24 hr capsule Commonly known as: Adderall XR Take 1 capsule (20 mg total) by mouth every morning.   aspirin EC 325 MG tablet Take 1 tablet (325 mg total) by mouth daily.   cetirizine 10 MG tablet Commonly known as: ZYRTEC Take 10 mg by mouth daily.   estradiol 0.1 MG/GM vaginal cream Commonly known as: ESTRACE VAGINAL Apply a fingertip amount to urethra 2 times per week. What changed:  how much to take when to take this reasons to take this  methocarbamol 500 MG tablet Commonly known as: ROBAXIN Take 1 tablet (500 mg total) by mouth every 6 (six) hours as needed for muscle spasms.   Nasal Spray 0.05 % Soln Place 1 spray into the nose daily as needed (congestion).   oxyCODONE 5 MG immediate release tablet Commonly known as: Oxy IR/ROXICODONE Take 1-2 tablets (5-10 mg total) by mouth every 4 (four) hours as needed for moderate pain or severe pain. What changed:  medication strength how much to take when to take this reasons to take  this   phenazopyridine 200 MG tablet Commonly known as: PYRIDIUM Take 1 tablet (200 mg total) by mouth 3 (three) times daily as needed.   polyethylene glycol powder 17 GM/SCOOP powder Commonly known as: GLYCOLAX/MIRALAX TAKE 17 GRAMS BY MOUTH 2 TIMES DAILY AS NEEDED. What changed: See the new instructions.   pregabalin 300 MG capsule Commonly known as: LYRICA Take 1 capsule (300 mg total) by mouth 2 (two) times daily.   Premarin vaginal cream Generic drug: conjugated estrogens Use small bead on tip of finger and swab inside of vagina every other day. What changed:  when to take this reasons to take this   REFRESH OPTIVE PF OP Apply 1 drop to eye in the morning and at bedtime.   Restasis 0.05 % ophthalmic emulsion Generic drug: cycloSPORINE Place 1 drop into both eyes 2 times daily. What changed:  how much to take how to take this when to take this   valACYclovir 1000 MG tablet Commonly known as: VALTREX Take 1 tablet by mouth once daily. What changed: Another medication with the same name was changed. Make sure you understand how and when to take each.   valACYclovir 1000 MG tablet Commonly known as: Valtrex Take 1 tablet (1,000 mg total) by mouth 3 (three) times daily. What changed: when to take this   verapamil 120 MG CR tablet Commonly known as: CALAN-SR Take 1 tablet (120 mg total) by mouth at bedtime.        Diagnostic Studies: DG Clavicle Left  Result Date: 02/03/2022 CLINICAL DATA:  Elective surgery, ORIF left distal clavicle fracture EXAM: LEFT CLAVICLE - 2+ VIEWS COMPARISON:  None Available. FINDINGS: Intraoperative images during distal clavicle fracture fixation and coracoclavicular fixation. Intact hardware without evidence of immediate complication. Partially visualized left shoulder arthroplasty hardware. IMPRESSION: Intraoperative images during left distal clavicle and coracoclavicular fixation. Intact hardware without evidence of immediate  complication. Electronically Signed   By: Maurine Simmering M.D.   On: 02/03/2022 16:39   DG Ankle Complete Right  Result Date: 02/03/2022 CLINICAL DATA:  Open treatment of right pilon ankle fracture surgery, elective EXAM: RIGHT ANKLE - COMPLETE 3+ VIEW COMPARISON:  CT 02/02/2022 FINDINGS: Intraoperative images during trimalleolar ankle fracture ORIF. Improved fracture alignment. Intact hardware without evidence of immediate complication. IMPRESSION: Intraoperative images during trimalleolar ankle fracture ORIF. Improved fracture alignment. No evidence of immediate complication. Electronically Signed   By: Maurine Simmering M.D.   On: 02/03/2022 16:37   CT ANKLE RIGHT WO CONTRAST  Result Date: 02/02/2022 CLINICAL DATA:  Ankle pain from fracture EXAM: CT OF THE RIGHT ANKLE WITHOUT CONTRAST TECHNIQUE: Multidetector CT imaging of the right ankle was performed according to the standard protocol. Multiplanar CT image reconstructions were also generated. RADIATION DOSE REDUCTION: This exam was performed according to the departmental dose-optimization program which includes automated exposure control, adjustment of the mA and/or kV according to patient size and/or use of iterative reconstruction technique. COMPARISON:  None Available. FINDINGS: Bones/Joint/Cartilage  There is a trimalleolar ankle fracture. Mild displaced distal fibular fracture with up to 7 mm posterolateral displacement. Medial malleolar fracture with up to 6 mm displacement. Posterior malleolar fracture with up to 5 mm posterior displacement and 6 mm foreshortening. The posterior malleolar fracture involves approximately 45% of the articular surface. There is a tibiotalar joint effusion. No additional fracture in the hindfoot or partially visualized midfoot. Ligaments Suboptimally assessed by CT. Muscles and Tendons No evidence of tendon entrapment. Soft tissues Ankle soft tissue swelling.  No focal fluid collection. IMPRESSION: Trimalleolar ankle fracture  with displacement as described above. Of note, the posterior malleolar fracture involves approximately 45% of the articular surface. Electronically Signed   By: Maurine Simmering M.D.   On: 02/02/2022 09:43    Disposition: Discharge disposition: 01-Home or Self Care          Follow-up Information     Tania Ade, MD. Schedule an appointment as soon as possible for a visit in 2 week(s).   Specialty: Orthopedic Surgery Contact information: [address] [place] [address] [phone]         Laurey Morale, MD Follow up.   Specialty: Family Medicine Contact information: [place] [address] [place]. Follow up.   Why: home health PT AND OT services will be provided by [facility], start of care within 48 hours post discharge Contact information: [place] [address] [phone]                  Signed: Rodolfo Notaro L. [place], PA-C 02/09/2022, 8:52 AM

## 2022-02-10 DIAGNOSIS — S42022D Displaced fracture of shaft of left clavicle, subsequent encounter for fracture with routine healing: Secondary | ICD-10-CM | POA: Diagnosis not present

## 2022-02-10 DIAGNOSIS — S89301D Unspecified physeal fracture of lower end of right fibula, subsequent encounter for fracture with routine healing: Secondary | ICD-10-CM | POA: Diagnosis not present

## 2022-02-10 DIAGNOSIS — S82301D Unspecified fracture of lower end of right tibia, subsequent encounter for closed fracture with routine healing: Secondary | ICD-10-CM | POA: Diagnosis not present

## 2022-02-12 IMAGING — MR MR SHOULDER*R* W/O CM
5 series · 40 of 40 positions shown · non-contrast
Comparison: None.

CLINICAL DATA: Right shoulder pain.

EXAM:
MRI OF THE RIGHT SHOULDER WITHOUT CONTRAST
TECHNIQUE: Multiplanar, multisequence MR imaging of the shoulder was performed.
No intravenous contrast was administered.

[Series 6: T2 fat-sat · axial · right · 4.0mm · 0.62mm/px · z∈[-39,+74]mm · 7 of 27 slices shown (1 of 3)]
[im 1/27]
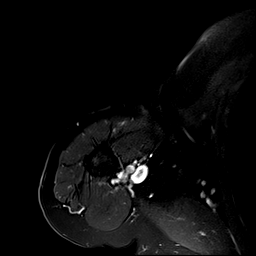
[im 5/27]
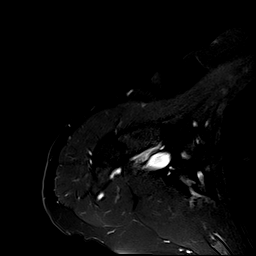
[im 9/27]
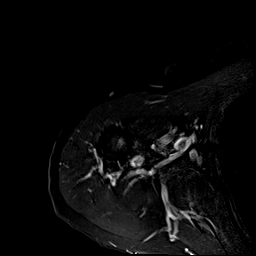
[im 14/27]
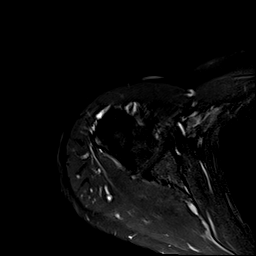
[im 18/27]
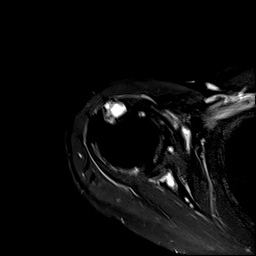
[im 22/27]
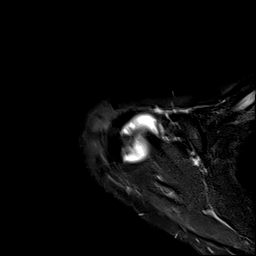
[im 27/27]
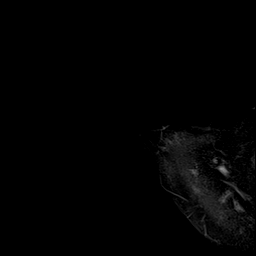

[Series 7: T2 fat-sat · oblique · right · 3.5mm · 0.44mm/px · 8 of 28 slices shown (2 of 3)]
[im 1/28]
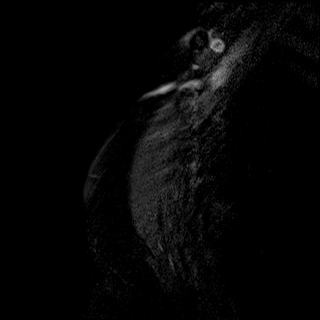
[im 4/28]
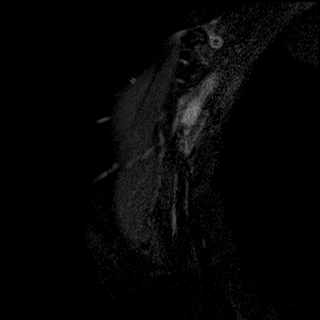
[im 8/28]
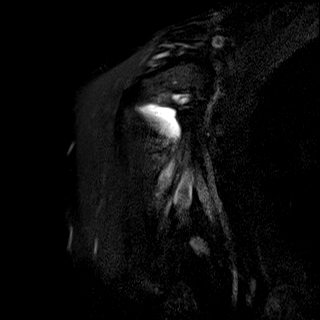
[im 12/28]
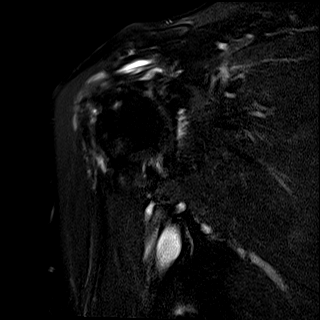
[im 16/28]
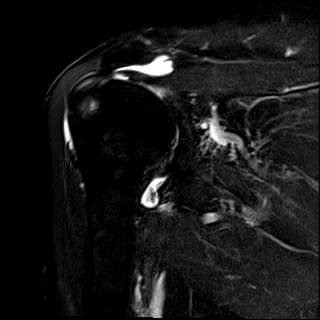
[im 20/28]
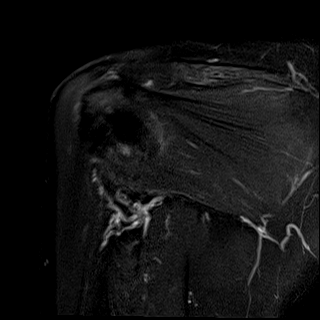
[im 24/28]
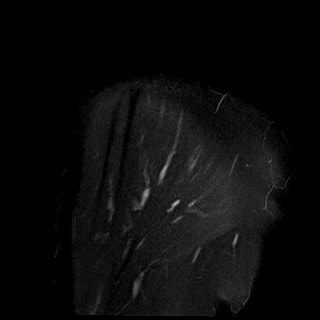
[im 28/28]
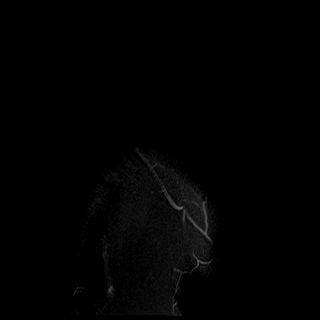

[Series 8: PD · oblique · right · 3.5mm · 0.44mm/px · 8 of 28 slices shown]
[im 1/28]
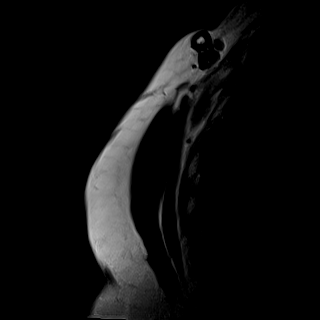
[im 4/28]
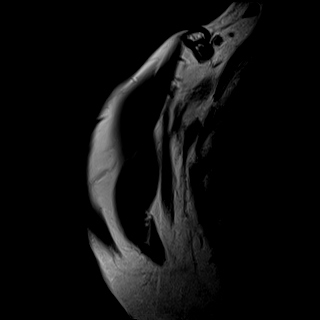
[im 8/28]
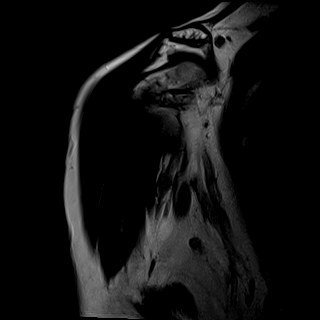
[im 12/28]
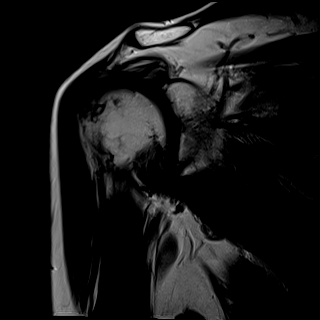
[im 16/28]
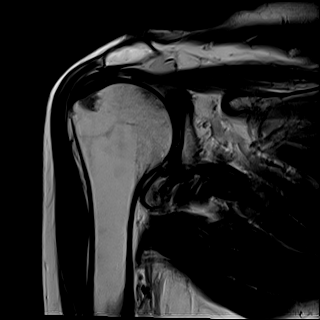
[im 20/28]
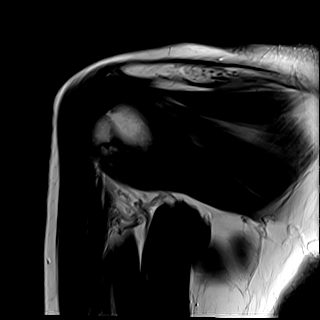
[im 24/28]
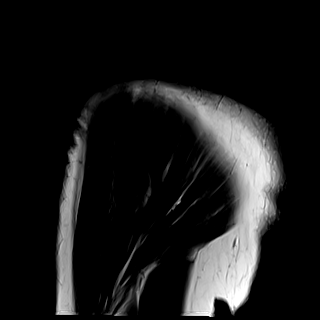
[im 28/28]
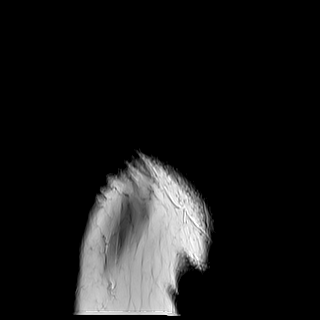

[Series 9: T1 · oblique · right · 3.0mm · 0.55mm/px · 9 of 31 slices shown]
[im 1/31]
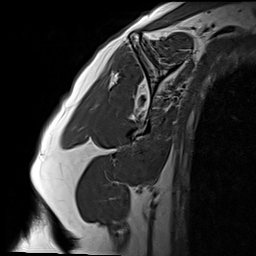
[im 4/31]
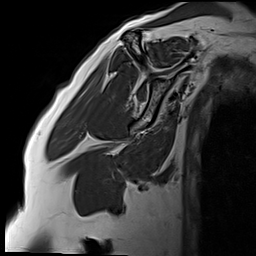
[im 8/31]
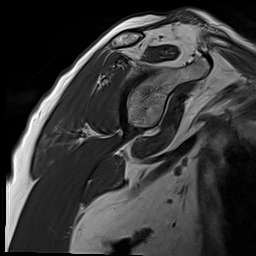
[im 12/31]
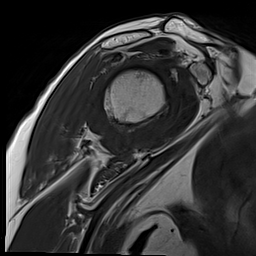
[im 16/31]
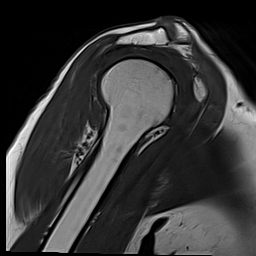
[im 19/31]
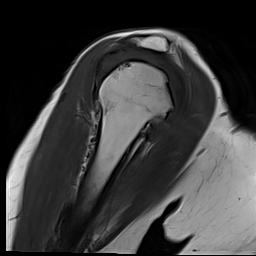
[im 23/31]
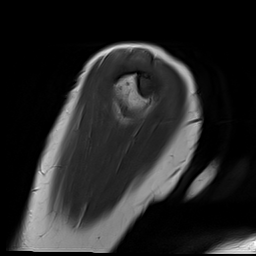
[im 27/31]
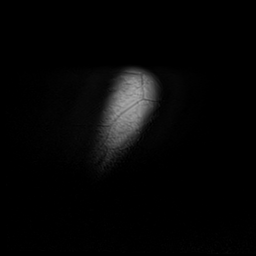
[im 31/31]
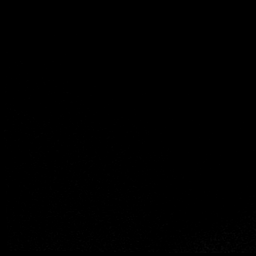

[Series 10: T2 fat-sat · oblique · right · 3.0mm · 0.55mm/px · 8 of 30 slices shown (3 of 3)]
[im 1/30]
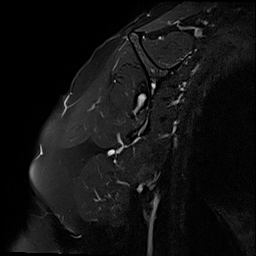
[im 5/30]
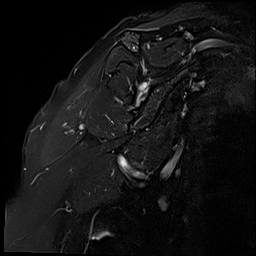
[im 9/30]
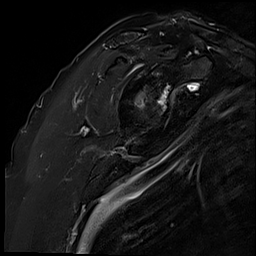
[im 13/30]
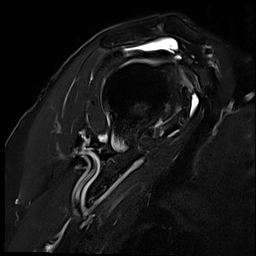
[im 17/30]
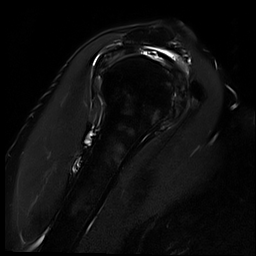
[im 21/30]
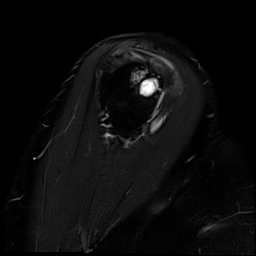
[im 25/30]
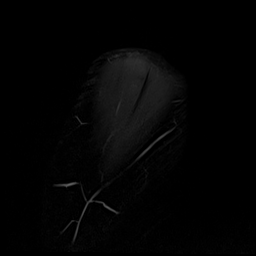
[im 30/30]
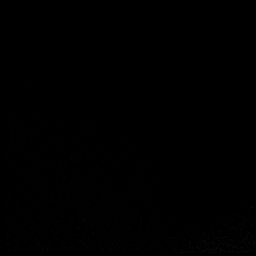

[40 of 40 positions shown; findings below may reference images not displayed]

FINDINGS: Rotator cuff: Moderate supraspinatus tendinosis with high-grade
partial-thickness articular surface tear of the proximal tendon,
measuring 11 mm in AP dimension. Moderate distal infraspinatus and
subscapularis tendinosis. The teres minor tendon is unremarkable.

Muscles:  Mild subscapularis muscle atrophy.  No muscle edema.

Biceps long head: Intact and normally positioned. Intra-articular
tendinosis. Synovitis in the bicipital groove.

Acromioclavicular Joint: Minimal arthropathy of the
acromioclavicular joint. Type I acromion. Small amount of fluid in
the subacromial/subdeltoid bursa.

Glenohumeral Joint: Scattered areas of partial and full-thickness
cartilage loss over the humeral head and glenoid with trace
subchondral marrow edema and cystic change. Trace joint effusion
with synovitis.

Labrum:  Diffusely degenerated.

Bones: No acute fracture or dislocation. No suspicious bone lesion.
Prominent subchondral cystic change in the greater tuberosity.

Other: None.
IMPRESSION: 1. Moderate supraspinatus tendinosis with high-grade
partial-thickness articular surface tear of the proximal tendon.
2. Moderate infraspinatus and subscapularis tendinosis.
3. Intra-articular biceps tendinosis with extra-articular biceps
tenosynovitis.
4. Mild subacromial/subdeltoid bursitis.
5. Mild to moderate glenohumeral osteoarthritis.

## 2022-02-18 DIAGNOSIS — S42032A Displaced fracture of lateral end of left clavicle, initial encounter for closed fracture: Secondary | ICD-10-CM | POA: Diagnosis not present

## 2022-02-19 ENCOUNTER — Other Ambulatory Visit (HOSPITAL_COMMUNITY): Payer: Self-pay

## 2022-02-23 ENCOUNTER — Other Ambulatory Visit (HOSPITAL_COMMUNITY): Payer: Self-pay

## 2022-02-23 ENCOUNTER — Encounter: Payer: Self-pay | Admitting: Family Medicine

## 2022-02-23 ENCOUNTER — Telehealth (INDEPENDENT_AMBULATORY_CARE_PROVIDER_SITE_OTHER): Payer: Commercial Managed Care - PPO | Admitting: Family Medicine

## 2022-02-23 DIAGNOSIS — M8000XD Age-related osteoporosis with current pathological fracture, unspecified site, subsequent encounter for fracture with routine healing: Secondary | ICD-10-CM | POA: Diagnosis not present

## 2022-02-23 DIAGNOSIS — M159 Polyosteoarthritis, unspecified: Secondary | ICD-10-CM

## 2022-02-23 DIAGNOSIS — F1190 Opioid use, unspecified, uncomplicated: Secondary | ICD-10-CM

## 2022-02-23 DIAGNOSIS — M1990 Unspecified osteoarthritis, unspecified site: Secondary | ICD-10-CM | POA: Insufficient documentation

## 2022-02-23 MED ORDER — OXYCODONE HCL 20 MG PO TABS: 20.0000 mg | ORAL_TABLET | Freq: Four times a day (QID) | ORAL | 0 refills | Status: DC | PRN

## 2022-02-23 MED ORDER — CELECOXIB 200 MG PO CAPS: 200.0000 mg | ORAL_CAPSULE | Freq: Two times a day (BID) | ORAL | 0 refills | Status: DC

## 2022-02-23 NOTE — Progress Notes (Signed)
Subjective:    Patient ID: [name], female    DOB: 24-Aug-1955, 67 y.o.   MRN: [id number]  HPI Virtual Visit via Video Note  I connected with the patient on 02/23/22 at  4:00 PM EST by a video enabled telemedicine application and verified that I am speaking with the correct person using two identifiers.  Location patient: home Location provider:work or home office Persons participating in the virtual visit: patient, provider  I discussed the limitations of evaluation and management by telemedicine and the availability of in person appointments. The patient expressed understanding and agreed to proceed.   HPI: Here for pain management. Her OA has been stable, but she fell about 2 weeks ago after her foot slipping, and she had fractures of the left clavicle and the right ankle. She had ORIF surgeries on both of these. She is recovering well from these surgeries. Of note her last DEXA (a little over 2 years ago) showed osteopenia. She is taking calcium and vitamin D.    ROS: See pertinent positives and negatives per HPI.  Past Medical History:  Diagnosis Date   ADHD (attention deficit hyperactivity disorder)    ARDS (adult respiratory distress syndrome) ([place]) 2007   Arthritis    Depression    Gallstones 01/07/2018   Ganglion cyst of dorsum of right wrist    Headache    Hepatitis    2004 non detectable now   MRSA (methicillin resistant Staphylococcus aureus) 2007   Osteoporosis    Pneumonia 2009   Prosthetic eye globe    right eye    Raynaud's disease     Past Surgical History:  Procedure Laterality Date   ABDOMINAL HYSTERECTOMY     ANTERIOR CERVICAL DECOMP/DISCECTOMY FUSION N/A 12/12/2014   Procedure: ANTERIOR CERVICAL DECOMPRESSION/DISCECTOMY FUSION 3 LEVELS;  Surgeon: Phylliss Bob, MD;  Location: [facility];  Service: Orthopedics;  Laterality: N/A;  Anterior cervical decompression fusion, cerivcal 5-6, cervical 6-7, cervical 7-thoracic 1 with instrumentation and  allografrt   APPENDECTOMY  1974   CATARACT EXTRACTION Left 11/2018   CHOLECYSTECTOMY N/A 01/07/2018   Procedure: LAPAROSCOPIC CHOLECYSTECTOMY WITH INTRAOPERATIVE CHOLANGIOGRAM;  Surgeon: Fanny Skates, MD;  Location: [facility];  Service: General;  Laterality: N/A;   COLONOSCOPY  04/14/2013   per Dr. Ardis Hughs, clear, repeat in 10 yrs    CORNEAL TRANSPLANT Right 2007   x4   ENUCLEATION Right 2013   eye infection   GANGLION CYST EXCISION Right 05/25/2016   Procedure: RIGHT WRIST ULNAR GANGLION EXCISION;  Surgeon: Milly Jakob, MD;  Location: [facility];  Service: Orthopedics;  Laterality: Right;   ORIF CLAVICULAR FRACTURE Left 02/03/2022   Procedure: OPEN REDUCTION INTERNAL FIXATION (ORIF) LEFT CLAVICULAR FRACTURE WITH CORACOLAVICULAR FIXATION;  Surgeon: Tania Ade, MD;  Location: [facility];  Service: Orthopedics;  Laterality: Left;  BIOMET ZIP LOOP WITH CLAVICLE PLATES   ORIF FIBULA FRACTURE Right 02/03/2022   Procedure: OPEN TREATMENT OF RIGHT PILON ANKLE FRACTURE INCLUDING LATERAL MALLEOLUS FRACTURE;  Surgeon: Erle Crocker, MD;  Location: [facility];  Service: Orthopedics;  Laterality: Right;  LENGTH OF SURGERY: 120 MINUTES   REVERSE SHOULDER ARTHROPLASTY Right 07/10/2021   Procedure: REVERSE SHOULDER ARTHROPLASTY;  Surgeon: Tania Ade, MD;  Location: [facility];  Service: Orthopedics;  Laterality: Right;   RHINOPLASTY  1980   ROBOTIC ASSISTED LAPAROSCOPIC SACROCOLPOPEXY Bilateral 06/13/2020   Procedure: XI ROBOTIC ASSISTED LAPAROSCOPIC SACROCOLPOPEXY AND SUPRACERVICAL HYSTERECTOMY AND BILATERAL  SALPINGO OOPHERECTOMY;  Surgeon: Ardis Hughs, MD;  Location: [facility];  Service: Urology;  Laterality: Bilateral;   SYNDESMOSIS REPAIR Right 02/03/2022   Procedure: POSSIBLE OPEN TREATMENT RIGHT SYNDESMOSIS;  Surgeon: Erle Crocker, MD;  Location: [facility];  Service: Orthopedics;  Laterality: Right;   TONSILLECTOMY     removed in 3rd grade   TOTAL SHOULDER ARTHROPLASTY Left  03/13/2019   Procedure: TOTAL SHOULDER ARTHROPLASTY;  Surgeon: Tania Ade, MD;  Location: [facility];  Service: Orthopedics;  Laterality: Left;    Family History  Problem Relation Age of Onset   Breast cancer Maternal Aunt 60   Colon cancer Neg Hx      Current Outpatient Medications:    amphetamine-dextroamphetamine (ADDERALL XR) 20 MG 24 hr capsule, Take 1 capsule (20 mg total) by mouth every morning., Disp: 30 capsule, Rfl: 0   aspirin EC 325 MG tablet, Take 1 tablet (325 mg total) by mouth daily., Disp: 1 tablet, Rfl: 0   Carboxymethylcellul-Glycerin (REFRESH OPTIVE PF OP), Apply 1 drop to eye in the morning and at bedtime., Disp: , Rfl:    celecoxib (CELEBREX) 200 MG capsule, Take 1 capsule (200 mg total) by mouth 2 (two) times daily., Disp: 60 capsule, Rfl: 0   cetirizine (ZYRTEC) 10 MG tablet, Take 10 mg by mouth daily., Disp: , Rfl:    conjugated estrogens (PREMARIN) vaginal cream, Use small bead on tip of finger and swab inside of vagina every other day. (Patient taking differently: Place 1 Applicatorful vaginally as needed (dryness).), Disp: 42.5 g, Rfl: PRN   cycloSPORINE (RESTASIS) 0.05 % ophthalmic emulsion, Place 1 drop into both eyes 2 times daily. (Patient taking differently: Place 1 drop into both eyes 2 (two) times daily.), Disp: 60 each, Rfl: 11   estradiol (ESTRACE VAGINAL) 0.1 MG/GM vaginal cream, Apply a fingertip amount to urethra 2 times per week. (Patient taking differently: Place 1 Applicatorful vaginally as needed (dryness).), Disp: 42.5 g, Rfl: 3   methocarbamol (ROBAXIN) 500 MG tablet, Take 1 tablet (500 mg total) by mouth every 6 (six) hours as needed for muscle spasms., Disp: 40 tablet, Rfl: 0   Oxymetazoline HCl (NASAL SPRAY) 0.05 % SOLN, Place 1 spray into the nose daily as needed (congestion)., Disp: , Rfl:    phenazopyridine (PYRIDIUM) 200 MG tablet, Take 1 tablet (200 mg total) by mouth 3 (three) times daily as needed., Disp: 30 tablet,  Rfl: 1   polyethylene glycol powder (GLYCOLAX/MIRALAX) powder, TAKE 17 GRAMS BY MOUTH 2 TIMES DAILY AS NEEDED. (Patient taking differently: Take 17 g by mouth as needed for mild constipation.), Disp: 3162 g, Rfl: 1   pregabalin (LYRICA) 300 MG capsule, Take 1 capsule (300 mg total) by mouth 2 (two) times daily., Disp: 60 capsule, Rfl: 5   valACYclovir (VALTREX) 1000 MG tablet, Take 1 tablet by mouth once daily., Disp: 90 tablet, Rfl: 3   valACYclovir (VALTREX) 1000 MG tablet, Take 1 tablet (1,000 mg total) by mouth 3 (three) times daily. (Patient taking differently: Take 1,000 mg by mouth daily.), Disp: 30 tablet, Rfl: 0   verapamil (CALAN-SR) 120 MG CR tablet, Take 1 tablet (120 mg total) by mouth at bedtime., Disp: 90 tablet, Rfl: 0   [START ON 05/01/2022] Oxycodone HCl 20 MG TABS, Take 1 tablet (20 mg total) by mouth every 6 (six) hours as needed (pain)., Disp: 120 tablet, Rfl: 0  EXAM:  VITALS per patient if applicable:  GENERAL: alert, oriented, appears well and in no acute distress  HEENT: atraumatic, conjunttiva clear, no obvious abnormalities on inspection of external nose  and ears  NECK: normal movements of the head and neck  LUNGS: on inspection no signs of respiratory distress, breathing rate appears normal, no obvious gross SOB, gasping or wheezing  CV: no obvious cyanosis  MS: moves all visible extremities without noticeable abnormality  PSYCH/NEURO: pleasant and cooperative, no obvious depression or anxiety, speech and thought processing grossly intact  ASSESSMENT AND PLAN: Pain management.  Indication for chronic opioid: OA Medication and dose: Oxycodone 20 mg  # pills per month: 120 Last UDS date: 05-01-21 Opioid Treatment Agreement signed (Y/N): 04-30-17 Opioid Treatment Agreement last reviewed with patient:  02-23-22 [facility] reviewed this encounter (include red flags): Yes Meds were refilled. We will set up another DEXA.  [name], MD  Discussed the following  assessment and plan:  Age-related osteoporosis with current pathological fracture with routine healing, subsequent encounter - Plan: DG Bone Density     I discussed the assessment and treatment plan with the patient. The patient was provided an opportunity to ask questions and all were answered. The patient agreed with the plan and demonstrated an understanding of the instructions.   The patient was advised to call back or seek an in-person evaluation if the symptoms worsen or if the condition fails to improve as anticipated.      Review of Systems     Objective:   Physical Exam        Assessment & Plan:

## 2022-02-26 ENCOUNTER — Other Ambulatory Visit: Payer: Self-pay | Admitting: Family Medicine

## 2022-02-27 ENCOUNTER — Other Ambulatory Visit: Payer: Self-pay

## 2022-02-27 ENCOUNTER — Other Ambulatory Visit (HOSPITAL_COMMUNITY): Payer: Self-pay

## 2022-02-27 MED ORDER — AMPHETAMINE-DEXTROAMPHET ER 20 MG PO CP24
20.0000 mg | ORAL_CAPSULE | ORAL | 0 refills | Status: DC
  Filled 2022-02-27: qty 30, 30d supply, fill #0

## 2022-02-27 NOTE — Telephone Encounter (Signed)
Done

## 2022-02-27 NOTE — Telephone Encounter (Signed)
Last VV-02/23/22 Last refill-01/28/2022--30 tabs, 0 refills  No future OV scheduled.

## 2022-03-03 DIAGNOSIS — S82301D Unspecified fracture of lower end of right tibia, subsequent encounter for closed fracture with routine healing: Secondary | ICD-10-CM | POA: Diagnosis not present

## 2022-03-03 DIAGNOSIS — I7300 Raynaud's syndrome without gangrene: Secondary | ICD-10-CM | POA: Diagnosis not present

## 2022-03-03 DIAGNOSIS — F1911 Other psychoactive substance abuse, in remission: Secondary | ICD-10-CM | POA: Diagnosis not present

## 2022-03-03 DIAGNOSIS — S42022D Displaced fracture of shaft of left clavicle, subsequent encounter for fracture with routine healing: Secondary | ICD-10-CM | POA: Diagnosis not present

## 2022-03-03 DIAGNOSIS — M810 Age-related osteoporosis without current pathological fracture: Secondary | ICD-10-CM | POA: Diagnosis not present

## 2022-03-03 DIAGNOSIS — F32A Depression, unspecified: Secondary | ICD-10-CM | POA: Diagnosis not present

## 2022-03-03 DIAGNOSIS — F900 Attention-deficit hyperactivity disorder, predominantly inattentive type: Secondary | ICD-10-CM | POA: Diagnosis not present

## 2022-03-03 DIAGNOSIS — S89301D Unspecified physeal fracture of lower end of right fibula, subsequent encounter for fracture with routine healing: Secondary | ICD-10-CM | POA: Diagnosis not present

## 2022-03-03 DIAGNOSIS — M2550 Pain in unspecified joint: Secondary | ICD-10-CM | POA: Diagnosis not present

## 2022-03-03 DIAGNOSIS — Z96698 Presence of other orthopedic joint implants: Secondary | ICD-10-CM | POA: Diagnosis not present

## 2022-03-08 DIAGNOSIS — S82891A Other fracture of right lower leg, initial encounter for closed fracture: Secondary | ICD-10-CM | POA: Diagnosis not present

## 2022-03-10 DIAGNOSIS — M2550 Pain in unspecified joint: Secondary | ICD-10-CM | POA: Diagnosis not present

## 2022-03-10 DIAGNOSIS — S82301D Unspecified fracture of lower end of right tibia, subsequent encounter for closed fracture with routine healing: Secondary | ICD-10-CM | POA: Diagnosis not present

## 2022-03-10 DIAGNOSIS — F1911 Other psychoactive substance abuse, in remission: Secondary | ICD-10-CM | POA: Diagnosis not present

## 2022-03-10 DIAGNOSIS — Z96698 Presence of other orthopedic joint implants: Secondary | ICD-10-CM | POA: Diagnosis not present

## 2022-03-10 DIAGNOSIS — S42022D Displaced fracture of shaft of left clavicle, subsequent encounter for fracture with routine healing: Secondary | ICD-10-CM | POA: Diagnosis not present

## 2022-03-10 DIAGNOSIS — F900 Attention-deficit hyperactivity disorder, predominantly inattentive type: Secondary | ICD-10-CM | POA: Diagnosis not present

## 2022-03-10 DIAGNOSIS — M810 Age-related osteoporosis without current pathological fracture: Secondary | ICD-10-CM | POA: Diagnosis not present

## 2022-03-10 DIAGNOSIS — S89301D Unspecified physeal fracture of lower end of right fibula, subsequent encounter for fracture with routine healing: Secondary | ICD-10-CM | POA: Diagnosis not present

## 2022-03-10 DIAGNOSIS — F32A Depression, unspecified: Secondary | ICD-10-CM | POA: Diagnosis not present

## 2022-03-10 DIAGNOSIS — I7300 Raynaud's syndrome without gangrene: Secondary | ICD-10-CM | POA: Diagnosis not present

## 2022-03-17 DIAGNOSIS — S89301D Unspecified physeal fracture of lower end of right fibula, subsequent encounter for fracture with routine healing: Secondary | ICD-10-CM | POA: Diagnosis not present

## 2022-03-17 DIAGNOSIS — S42022D Displaced fracture of shaft of left clavicle, subsequent encounter for fracture with routine healing: Secondary | ICD-10-CM | POA: Diagnosis not present

## 2022-03-17 DIAGNOSIS — Z96698 Presence of other orthopedic joint implants: Secondary | ICD-10-CM | POA: Diagnosis not present

## 2022-03-17 DIAGNOSIS — M810 Age-related osteoporosis without current pathological fracture: Secondary | ICD-10-CM | POA: Diagnosis not present

## 2022-03-17 DIAGNOSIS — I7300 Raynaud's syndrome without gangrene: Secondary | ICD-10-CM | POA: Diagnosis not present

## 2022-03-17 DIAGNOSIS — F1911 Other psychoactive substance abuse, in remission: Secondary | ICD-10-CM | POA: Diagnosis not present

## 2022-03-17 DIAGNOSIS — M2550 Pain in unspecified joint: Secondary | ICD-10-CM | POA: Diagnosis not present

## 2022-03-17 DIAGNOSIS — S82301D Unspecified fracture of lower end of right tibia, subsequent encounter for closed fracture with routine healing: Secondary | ICD-10-CM | POA: Diagnosis not present

## 2022-03-17 DIAGNOSIS — F900 Attention-deficit hyperactivity disorder, predominantly inattentive type: Secondary | ICD-10-CM | POA: Diagnosis not present

## 2022-03-17 DIAGNOSIS — F32A Depression, unspecified: Secondary | ICD-10-CM | POA: Diagnosis not present

## 2022-03-19 DIAGNOSIS — F1911 Other psychoactive substance abuse, in remission: Secondary | ICD-10-CM | POA: Diagnosis not present

## 2022-03-19 DIAGNOSIS — Z96698 Presence of other orthopedic joint implants: Secondary | ICD-10-CM | POA: Diagnosis not present

## 2022-03-19 DIAGNOSIS — F900 Attention-deficit hyperactivity disorder, predominantly inattentive type: Secondary | ICD-10-CM | POA: Diagnosis not present

## 2022-03-19 DIAGNOSIS — M2550 Pain in unspecified joint: Secondary | ICD-10-CM | POA: Diagnosis not present

## 2022-03-19 DIAGNOSIS — S89301D Unspecified physeal fracture of lower end of right fibula, subsequent encounter for fracture with routine healing: Secondary | ICD-10-CM | POA: Diagnosis not present

## 2022-03-19 DIAGNOSIS — I7300 Raynaud's syndrome without gangrene: Secondary | ICD-10-CM | POA: Diagnosis not present

## 2022-03-19 DIAGNOSIS — M810 Age-related osteoporosis without current pathological fracture: Secondary | ICD-10-CM | POA: Diagnosis not present

## 2022-03-19 DIAGNOSIS — F32A Depression, unspecified: Secondary | ICD-10-CM | POA: Diagnosis not present

## 2022-03-19 DIAGNOSIS — S82301D Unspecified fracture of lower end of right tibia, subsequent encounter for closed fracture with routine healing: Secondary | ICD-10-CM | POA: Diagnosis not present

## 2022-03-19 DIAGNOSIS — S42022D Displaced fracture of shaft of left clavicle, subsequent encounter for fracture with routine healing: Secondary | ICD-10-CM | POA: Diagnosis not present

## 2022-03-24 ENCOUNTER — Other Ambulatory Visit (HOSPITAL_COMMUNITY): Payer: Self-pay

## 2022-03-24 DIAGNOSIS — S39012A Strain of muscle, fascia and tendon of lower back, initial encounter: Secondary | ICD-10-CM | POA: Diagnosis not present

## 2022-03-24 DIAGNOSIS — F32A Depression, unspecified: Secondary | ICD-10-CM | POA: Diagnosis not present

## 2022-03-24 DIAGNOSIS — F1911 Other psychoactive substance abuse, in remission: Secondary | ICD-10-CM | POA: Diagnosis not present

## 2022-03-24 DIAGNOSIS — M810 Age-related osteoporosis without current pathological fracture: Secondary | ICD-10-CM | POA: Diagnosis not present

## 2022-03-24 DIAGNOSIS — M2550 Pain in unspecified joint: Secondary | ICD-10-CM | POA: Diagnosis not present

## 2022-03-24 DIAGNOSIS — F900 Attention-deficit hyperactivity disorder, predominantly inattentive type: Secondary | ICD-10-CM | POA: Diagnosis not present

## 2022-03-24 DIAGNOSIS — Z96698 Presence of other orthopedic joint implants: Secondary | ICD-10-CM | POA: Diagnosis not present

## 2022-03-24 DIAGNOSIS — S89301D Unspecified physeal fracture of lower end of right fibula, subsequent encounter for fracture with routine healing: Secondary | ICD-10-CM | POA: Diagnosis not present

## 2022-03-24 DIAGNOSIS — I7300 Raynaud's syndrome without gangrene: Secondary | ICD-10-CM | POA: Diagnosis not present

## 2022-03-24 DIAGNOSIS — S42022D Displaced fracture of shaft of left clavicle, subsequent encounter for fracture with routine healing: Secondary | ICD-10-CM | POA: Diagnosis not present

## 2022-03-24 DIAGNOSIS — S82301D Unspecified fracture of lower end of right tibia, subsequent encounter for closed fracture with routine healing: Secondary | ICD-10-CM | POA: Diagnosis not present

## 2022-03-24 MED ORDER — METHOCARBAMOL 500 MG PO TABS
500.0000 mg | ORAL_TABLET | Freq: Three times a day (TID) | ORAL | 0 refills | Status: DC
  Filled 2022-03-24: qty 30, 5d supply, fill #0

## 2022-03-24 MED ORDER — METHYLPREDNISOLONE 4 MG PO TBPK
ORAL_TABLET | ORAL | 0 refills | Status: AC
  Filled 2022-03-24: qty 21, 6d supply, fill #0

## 2022-03-25 ENCOUNTER — Other Ambulatory Visit: Payer: Self-pay | Admitting: Family Medicine

## 2022-03-25 ENCOUNTER — Other Ambulatory Visit: Payer: Self-pay

## 2022-03-25 DIAGNOSIS — Z1231 Encounter for screening mammogram for malignant neoplasm of breast: Secondary | ICD-10-CM

## 2022-03-25 MED ORDER — VERAPAMIL HCL ER 120 MG PO TBCR
120.0000 mg | EXTENDED_RELEASE_TABLET | Freq: Every day | ORAL | 0 refills | Status: DC
  Filled 2022-03-25: qty 90, 90d supply, fill #0

## 2022-03-25 NOTE — Telephone Encounter (Signed)
Pt LOV was 02/23/22 Last refill was done on 02/27/22 Please advise

## 2022-03-26 ENCOUNTER — Other Ambulatory Visit: Payer: Self-pay

## 2022-03-26 MED ORDER — AMPHETAMINE-DEXTROAMPHET ER 20 MG PO CP24
20.0000 mg | ORAL_CAPSULE | ORAL | 0 refills | Status: DC
  Filled 2022-03-26: qty 30, 30d supply, fill #0

## 2022-03-27 DIAGNOSIS — Z96698 Presence of other orthopedic joint implants: Secondary | ICD-10-CM | POA: Diagnosis not present

## 2022-03-27 DIAGNOSIS — S82301D Unspecified fracture of lower end of right tibia, subsequent encounter for closed fracture with routine healing: Secondary | ICD-10-CM | POA: Diagnosis not present

## 2022-03-27 DIAGNOSIS — S42022D Displaced fracture of shaft of left clavicle, subsequent encounter for fracture with routine healing: Secondary | ICD-10-CM | POA: Diagnosis not present

## 2022-03-27 DIAGNOSIS — I7300 Raynaud's syndrome without gangrene: Secondary | ICD-10-CM | POA: Diagnosis not present

## 2022-03-27 DIAGNOSIS — S89301D Unspecified physeal fracture of lower end of right fibula, subsequent encounter for fracture with routine healing: Secondary | ICD-10-CM | POA: Diagnosis not present

## 2022-03-27 DIAGNOSIS — F32A Depression, unspecified: Secondary | ICD-10-CM | POA: Diagnosis not present

## 2022-03-27 DIAGNOSIS — F900 Attention-deficit hyperactivity disorder, predominantly inattentive type: Secondary | ICD-10-CM | POA: Diagnosis not present

## 2022-03-27 DIAGNOSIS — F1911 Other psychoactive substance abuse, in remission: Secondary | ICD-10-CM | POA: Diagnosis not present

## 2022-03-27 DIAGNOSIS — M2550 Pain in unspecified joint: Secondary | ICD-10-CM | POA: Diagnosis not present

## 2022-03-27 DIAGNOSIS — M810 Age-related osteoporosis without current pathological fracture: Secondary | ICD-10-CM | POA: Diagnosis not present

## 2022-03-31 DIAGNOSIS — F900 Attention-deficit hyperactivity disorder, predominantly inattentive type: Secondary | ICD-10-CM | POA: Diagnosis not present

## 2022-03-31 DIAGNOSIS — S82301D Unspecified fracture of lower end of right tibia, subsequent encounter for closed fracture with routine healing: Secondary | ICD-10-CM | POA: Diagnosis not present

## 2022-03-31 DIAGNOSIS — F1911 Other psychoactive substance abuse, in remission: Secondary | ICD-10-CM | POA: Diagnosis not present

## 2022-03-31 DIAGNOSIS — M810 Age-related osteoporosis without current pathological fracture: Secondary | ICD-10-CM | POA: Diagnosis not present

## 2022-03-31 DIAGNOSIS — S42022D Displaced fracture of shaft of left clavicle, subsequent encounter for fracture with routine healing: Secondary | ICD-10-CM | POA: Diagnosis not present

## 2022-03-31 DIAGNOSIS — Z96698 Presence of other orthopedic joint implants: Secondary | ICD-10-CM | POA: Diagnosis not present

## 2022-03-31 DIAGNOSIS — I7300 Raynaud's syndrome without gangrene: Secondary | ICD-10-CM | POA: Diagnosis not present

## 2022-03-31 DIAGNOSIS — M2550 Pain in unspecified joint: Secondary | ICD-10-CM | POA: Diagnosis not present

## 2022-03-31 DIAGNOSIS — F32A Depression, unspecified: Secondary | ICD-10-CM | POA: Diagnosis not present

## 2022-03-31 DIAGNOSIS — S89301D Unspecified physeal fracture of lower end of right fibula, subsequent encounter for fracture with routine healing: Secondary | ICD-10-CM | POA: Diagnosis not present

## 2022-04-02 DIAGNOSIS — S42022D Displaced fracture of shaft of left clavicle, subsequent encounter for fracture with routine healing: Secondary | ICD-10-CM | POA: Diagnosis not present

## 2022-04-02 DIAGNOSIS — S82301D Unspecified fracture of lower end of right tibia, subsequent encounter for closed fracture with routine healing: Secondary | ICD-10-CM | POA: Diagnosis not present

## 2022-04-02 DIAGNOSIS — M2550 Pain in unspecified joint: Secondary | ICD-10-CM | POA: Diagnosis not present

## 2022-04-02 DIAGNOSIS — F32A Depression, unspecified: Secondary | ICD-10-CM | POA: Diagnosis not present

## 2022-04-02 DIAGNOSIS — I7300 Raynaud's syndrome without gangrene: Secondary | ICD-10-CM | POA: Diagnosis not present

## 2022-04-02 DIAGNOSIS — F900 Attention-deficit hyperactivity disorder, predominantly inattentive type: Secondary | ICD-10-CM | POA: Diagnosis not present

## 2022-04-02 DIAGNOSIS — Z96698 Presence of other orthopedic joint implants: Secondary | ICD-10-CM | POA: Diagnosis not present

## 2022-04-02 DIAGNOSIS — F1911 Other psychoactive substance abuse, in remission: Secondary | ICD-10-CM | POA: Diagnosis not present

## 2022-04-02 DIAGNOSIS — M810 Age-related osteoporosis without current pathological fracture: Secondary | ICD-10-CM | POA: Diagnosis not present

## 2022-04-02 DIAGNOSIS — S89301D Unspecified physeal fracture of lower end of right fibula, subsequent encounter for fracture with routine healing: Secondary | ICD-10-CM | POA: Diagnosis not present

## 2022-04-03 ENCOUNTER — Other Ambulatory Visit (HOSPITAL_COMMUNITY): Payer: Self-pay

## 2022-04-03 MED ORDER — CEPHALEXIN 500 MG PO CAPS
500.0000 mg | ORAL_CAPSULE | Freq: Four times a day (QID) | ORAL | 0 refills | Status: DC
  Filled 2022-04-03: qty 40, 10d supply, fill #0

## 2022-04-06 DIAGNOSIS — S82891A Other fracture of right lower leg, initial encounter for closed fracture: Secondary | ICD-10-CM | POA: Diagnosis not present

## 2022-04-06 DIAGNOSIS — M5416 Radiculopathy, lumbar region: Secondary | ICD-10-CM | POA: Diagnosis not present

## 2022-04-07 ENCOUNTER — Other Ambulatory Visit: Payer: Self-pay | Admitting: Physical Medicine and Rehabilitation

## 2022-04-07 DIAGNOSIS — M5416 Radiculopathy, lumbar region: Secondary | ICD-10-CM

## 2022-04-07 DIAGNOSIS — F32A Depression, unspecified: Secondary | ICD-10-CM | POA: Diagnosis not present

## 2022-04-07 DIAGNOSIS — M2550 Pain in unspecified joint: Secondary | ICD-10-CM | POA: Diagnosis not present

## 2022-04-07 DIAGNOSIS — F1911 Other psychoactive substance abuse, in remission: Secondary | ICD-10-CM | POA: Diagnosis not present

## 2022-04-07 DIAGNOSIS — M810 Age-related osteoporosis without current pathological fracture: Secondary | ICD-10-CM | POA: Diagnosis not present

## 2022-04-07 DIAGNOSIS — S82301D Unspecified fracture of lower end of right tibia, subsequent encounter for closed fracture with routine healing: Secondary | ICD-10-CM | POA: Diagnosis not present

## 2022-04-07 DIAGNOSIS — S89301D Unspecified physeal fracture of lower end of right fibula, subsequent encounter for fracture with routine healing: Secondary | ICD-10-CM | POA: Diagnosis not present

## 2022-04-07 DIAGNOSIS — F900 Attention-deficit hyperactivity disorder, predominantly inattentive type: Secondary | ICD-10-CM | POA: Diagnosis not present

## 2022-04-07 DIAGNOSIS — I7300 Raynaud's syndrome without gangrene: Secondary | ICD-10-CM | POA: Diagnosis not present

## 2022-04-07 DIAGNOSIS — S42022D Displaced fracture of shaft of left clavicle, subsequent encounter for fracture with routine healing: Secondary | ICD-10-CM | POA: Diagnosis not present

## 2022-04-07 DIAGNOSIS — Z96698 Presence of other orthopedic joint implants: Secondary | ICD-10-CM | POA: Diagnosis not present

## 2022-04-09 ENCOUNTER — Ambulatory Visit
Admission: RE | Admit: 2022-04-09 | Discharge: 2022-04-09 | Disposition: A | Discharge status: Home / Self Care | Payer: Commercial Managed Care - PPO | Source: Ambulatory Visit | Attending: Physical Medicine and Rehabilitation | Admitting: Physical Medicine and Rehabilitation

## 2022-04-09 DIAGNOSIS — M4807 Spinal stenosis, lumbosacral region: Secondary | ICD-10-CM | POA: Diagnosis not present

## 2022-04-09 DIAGNOSIS — M5416 Radiculopathy, lumbar region: Secondary | ICD-10-CM

## 2022-04-09 DIAGNOSIS — M5127 Other intervertebral disc displacement, lumbosacral region: Secondary | ICD-10-CM | POA: Diagnosis not present

## 2022-04-10 ENCOUNTER — Other Ambulatory Visit (HOSPITAL_COMMUNITY): Payer: Self-pay

## 2022-04-10 MED ORDER — CEPHALEXIN 500 MG PO CAPS
500.0000 mg | ORAL_CAPSULE | Freq: Four times a day (QID) | ORAL | 0 refills | Status: DC
  Filled 2022-04-10: qty 40, 10d supply, fill #0

## 2022-04-11 ENCOUNTER — Telehealth: Payer: Commercial Managed Care - PPO | Admitting: Physician Assistant

## 2022-04-11 DIAGNOSIS — S42022D Displaced fracture of shaft of left clavicle, subsequent encounter for fracture with routine healing: Secondary | ICD-10-CM | POA: Diagnosis not present

## 2022-04-11 DIAGNOSIS — B379 Candidiasis, unspecified: Secondary | ICD-10-CM

## 2022-04-11 DIAGNOSIS — B370 Candidal stomatitis: Secondary | ICD-10-CM | POA: Diagnosis not present

## 2022-04-11 DIAGNOSIS — S89301D Unspecified physeal fracture of lower end of right fibula, subsequent encounter for fracture with routine healing: Secondary | ICD-10-CM | POA: Diagnosis not present

## 2022-04-11 DIAGNOSIS — S82301D Unspecified fracture of lower end of right tibia, subsequent encounter for closed fracture with routine healing: Secondary | ICD-10-CM | POA: Diagnosis not present

## 2022-04-11 MED ORDER — NYSTATIN 100000 UNIT/ML MT SUSP: 5.0000 mL | Freq: Four times a day (QID) | OROMUCOSAL | 0 refills | Status: DC

## 2022-04-11 MED ORDER — FLUCONAZOLE 150 MG PO TABS: 150.0000 mg | ORAL_TABLET | Freq: Once | ORAL | 0 refills | Status: AC

## 2022-04-11 NOTE — Progress Notes (Signed)
E-Visit for Vaginal Symptoms  We are sorry that you are not feeling well. Here is how we plan to help! Based on what you shared with me it looks like you: May have a yeast vaginosis  Vaginosis is an inflammation of the vagina that can result in discharge, itching and pain. The cause is usually a change in the normal balance of vaginal bacteria or an infection. Vaginosis can also result from reduced estrogen levels after menopause.  The most common causes of vaginosis are:   Bacterial vaginosis which results from an overgrowth of one on several organisms that are normally present in your vagina.   Yeast infections which are caused by a naturally occurring fungus called candida.   Vaginal atrophy (atrophic vaginosis) which results from the thinning of the vagina from reduced estrogen levels after menopause.    Factors that increase your risk of developing vaginosis include: Medications, such as antibiotics and steroids Uncontrolled diabetes Use of hygiene products such as bubble bath, vaginal spray or vaginal deodorant Douching Wearing damp or tight-fitting clothing Using an intrauterine device (IUD) for birth control Hormonal changes, such as those associated with pregnancy, birth control pills or menopause Sexual activity Having a sexually transmitted infection  Your treatment plan is A single Diflucan (fluconazole) 150mg  tablet once.  I have electronically sent this prescription into the pharmacy that you have chosen. I'm also going to send in the nystatin mouthwash for your suspected oral thrush.  Be sure to take all of the medication as directed. Stop taking any medication if you develop a rash, tongue swelling or shortness of breath. Mothers who are breast feeding should consider pumping and discarding their breast milk while on these antibiotics. However, there is no consensus that infant exposure at these doses would be harmful.  Remember that medication creams can weaken latex  condoms. Marland Kitchen   HOME CARE:  Good hygiene may prevent some types of vaginosis from recurring and may relieve some symptoms:  Avoid baths, hot tubs and whirlpool spas. Rinse soap from your outer genital area after a shower, and dry the area well to prevent irritation. Don't use scented or harsh soaps, such as those with deodorant or antibacterial action. Avoid irritants. These include scented tampons and pads. Wipe from front to back after using the toilet. Doing so avoids spreading fecal bacteria to your vagina.  Other things that may help prevent vaginosis include:  Don't douche. Your vagina doesn't require cleansing other than normal bathing. Repetitive douching disrupts the normal organisms that reside in the vagina and can actually increase your risk of vaginal infection. Douching won't clear up a vaginal infection. Use a latex condom. Both female and female latex condoms may help you avoid infections spread by sexual contact. Wear cotton underwear. Also wear pantyhose with a cotton crotch. If you feel comfortable without it, skip wearing underwear to bed. Yeast thrives in [employer] Your symptoms should improve in the next day or two.  GET HELP RIGHT AWAY IF:  You have pain in your lower abdomen ( pelvic area or over your ovaries) You develop nausea or vomiting You develop a fever Your discharge changes or worsens You have persistent pain with intercourse You develop shortness of breath, a rapid pulse, or you faint.  These symptoms could be signs of problems or infections that need to be evaluated by a medical provider now.  MAKE SURE YOU   Understand these instructions. Will watch your condition. Will get help right away if you are not  doing well or get worse.  Thank you for choosing an e-visit.  Your e-visit answers were reviewed by a board certified advanced clinical practitioner to complete your personal care plan. Depending upon the condition, your plan could have  included both over the counter or prescription medications.  Please review your pharmacy choice. Make sure the pharmacy is open so you can pick up prescription now. If there is a problem, you may contact your provider through [employer] and have the prescription routed to another pharmacy.  Your safety is important to [place]. If you have drug allergies check your prescription carefully.   For the next 24 hours you can use MyChart to ask questions about today's visit, request a non-urgent call back, or ask for a work or school excuse. You will get an email in the next two days asking about your experience. I hope that your e-visit has been valuable and will speed your recovery.   This appointment required 5-10 minutes of patient care (this includes precharting, chart review, review of results, face-to-face care, etc.).  Inda Coke PA-C

## 2022-04-13 DIAGNOSIS — M5416 Radiculopathy, lumbar region: Secondary | ICD-10-CM | POA: Diagnosis not present

## 2022-04-14 DIAGNOSIS — Z96698 Presence of other orthopedic joint implants: Secondary | ICD-10-CM | POA: Diagnosis not present

## 2022-04-14 DIAGNOSIS — F900 Attention-deficit hyperactivity disorder, predominantly inattentive type: Secondary | ICD-10-CM | POA: Diagnosis not present

## 2022-04-14 DIAGNOSIS — F32A Depression, unspecified: Secondary | ICD-10-CM | POA: Diagnosis not present

## 2022-04-14 DIAGNOSIS — M2550 Pain in unspecified joint: Secondary | ICD-10-CM | POA: Diagnosis not present

## 2022-04-14 DIAGNOSIS — S82301D Unspecified fracture of lower end of right tibia, subsequent encounter for closed fracture with routine healing: Secondary | ICD-10-CM | POA: Diagnosis not present

## 2022-04-14 DIAGNOSIS — S42022D Displaced fracture of shaft of left clavicle, subsequent encounter for fracture with routine healing: Secondary | ICD-10-CM | POA: Diagnosis not present

## 2022-04-14 DIAGNOSIS — M810 Age-related osteoporosis without current pathological fracture: Secondary | ICD-10-CM | POA: Diagnosis not present

## 2022-04-14 DIAGNOSIS — S89301D Unspecified physeal fracture of lower end of right fibula, subsequent encounter for fracture with routine healing: Secondary | ICD-10-CM | POA: Diagnosis not present

## 2022-04-14 DIAGNOSIS — F1911 Other psychoactive substance abuse, in remission: Secondary | ICD-10-CM | POA: Diagnosis not present

## 2022-04-14 DIAGNOSIS — I7300 Raynaud's syndrome without gangrene: Secondary | ICD-10-CM | POA: Diagnosis not present

## 2022-04-15 ENCOUNTER — Encounter (HOSPITAL_COMMUNITY): Payer: Self-pay | Admitting: Orthopaedic Surgery

## 2022-04-15 ENCOUNTER — Other Ambulatory Visit: Payer: Self-pay | Admitting: Orthopaedic Surgery

## 2022-04-15 ENCOUNTER — Other Ambulatory Visit (HOSPITAL_COMMUNITY): Payer: Self-pay

## 2022-04-15 ENCOUNTER — Other Ambulatory Visit: Payer: Self-pay

## 2022-04-15 DIAGNOSIS — M25571 Pain in right ankle and joints of right foot: Secondary | ICD-10-CM | POA: Diagnosis not present

## 2022-04-15 NOTE — Progress Notes (Signed)
SDW call  Patient was given pre-op instructions over the phone. Patient verbalized understanding of instructions provided.     PCP - Dr. [name] Cardiologist - Denies Pulmonary: Denies   PPM/ICD - Denies Device Orders - n/a Rep Notified - n/a   Chest x-ray - 06/30/2021 EKG -  06/30/2021 Stress Test - ECHO - 05/28/2010 Cardiac Cath -   Sleep Study/sleep apnea/CPAP: Denies  Non-diabetic Fasting Blood sugar range n/a How often check sugars n/a   Blood Thinner Instructions: Denies Aspirin Instructions:Denies   ERAS Protcol - Yes, clear fluids until 3 hours prior to surgery. Consume until 1300 PRE-SURGERY Ensure or G2- No   COVID TEST- n/a     Anesthesia review: No   Patient denies shortness of breath, fever, cough and chest pain over the phone call    Your procedure is scheduled on Thursday April 16, 2022   Report to [facility] Main Entrance "A" at 1330 P.M., then check in with the Admitting office.  Call this number if you have problems the morning of surgery:  [phone]   If you have any questions prior to your surgery date call [phone]: Open Monday-Friday 8am-4pm If you experience any cold or flu symptoms such as cough, fever, chills, shortness of breath, etc. between now and your scheduled surgery, please notify us at the above number     Remember:  Do not eat after midnight the night before your surgery  You may drink clear liquids until  1300 the morning of your surgery.   Clear liquids allowed are: Water, Non-Citrus Juices (without pulp), Carbonated Beverages, Clear Tea, Black Coffee ONLY (NO MILK, CREAM OR POWDERED CREAMER of any kind), and Gatorade   Take these medicines the morning of surgery with A SIP OF WATER:  Celebrex, keflex, restatis eye drops, lyrica  As needed: Oxycodone  As of today, STOP taking any Aspirin (unless otherwise instructed by your surgeon) Aleve, Naproxen, Ibuprofen, Motrin, Advil, Goody's, BC's, all herbal medications,  fish oil, and all vitamins.

## 2022-04-16 ENCOUNTER — Ambulatory Visit (HOSPITAL_COMMUNITY)
Admission: RE | Admit: 2022-04-16 | Discharge: 2022-04-17 | Disposition: A | Discharge status: Home / Self Care | Payer: Commercial Managed Care - PPO | Attending: Orthopaedic Surgery | Admitting: Orthopaedic Surgery

## 2022-04-16 ENCOUNTER — Ambulatory Visit (HOSPITAL_BASED_OUTPATIENT_CLINIC_OR_DEPARTMENT_OTHER): Payer: Commercial Managed Care - PPO | Admitting: Anesthesiology

## 2022-04-16 ENCOUNTER — Ambulatory Visit (HOSPITAL_COMMUNITY): Payer: Commercial Managed Care - PPO

## 2022-04-16 ENCOUNTER — Encounter (HOSPITAL_COMMUNITY)
Admission: RE | Disposition: A | Discharge status: Home / Self Care | Payer: Self-pay | Source: Home / Self Care | Attending: Orthopaedic Surgery

## 2022-04-16 ENCOUNTER — Ambulatory Visit (HOSPITAL_COMMUNITY): Payer: Commercial Managed Care - PPO | Admitting: Anesthesiology

## 2022-04-16 ENCOUNTER — Encounter (HOSPITAL_COMMUNITY): Payer: Self-pay | Admitting: Orthopaedic Surgery

## 2022-04-16 DIAGNOSIS — S82891D Other fracture of right lower leg, subsequent encounter for closed fracture with routine healing: Secondary | ICD-10-CM | POA: Diagnosis not present

## 2022-04-16 DIAGNOSIS — Z472 Encounter for removal of internal fixation device: Secondary | ICD-10-CM

## 2022-04-16 DIAGNOSIS — X58XXXD Exposure to other specified factors, subsequent encounter: Secondary | ICD-10-CM | POA: Diagnosis not present

## 2022-04-16 DIAGNOSIS — T8131XA Disruption of external operation (surgical) wound, not elsewhere classified, initial encounter: Secondary | ICD-10-CM | POA: Insufficient documentation

## 2022-04-16 DIAGNOSIS — T8489XA Other specified complication of internal orthopedic prosthetic devices, implants and grafts, initial encounter: Secondary | ICD-10-CM | POA: Diagnosis not present

## 2022-04-16 DIAGNOSIS — F32A Depression, unspecified: Secondary | ICD-10-CM | POA: Insufficient documentation

## 2022-04-16 DIAGNOSIS — T8469XA Infection and inflammatory reaction due to internal fixation device of other site, initial encounter: Secondary | ICD-10-CM | POA: Diagnosis not present

## 2022-04-16 DIAGNOSIS — M25571 Pain in right ankle and joints of right foot: Secondary | ICD-10-CM | POA: Diagnosis present

## 2022-04-16 HISTORY — PX: MINOR HARDWARE REMOVAL: SHX6474

## 2022-04-16 HISTORY — PX: APLIGRAFT PLACEMENT: SHX5228

## 2022-04-16 LAB — CBC
HCT: 40.5 % (ref 36.0–46.0)
Hemoglobin: 13.6 g/dL (ref 12.0–15.0)
MCH: 32.9 pg (ref 26.0–34.0)
MCHC: 33.6 g/dL (ref 30.0–36.0)
MCV: 98.1 fL (ref 80.0–100.0)
Platelets: 123 10*3/uL — ABNORMAL LOW (ref 150–400)
RBC: 4.13 MIL/uL (ref 3.87–5.11)
RDW: 13.7 % (ref 11.5–15.5)
WBC: 3.2 10*3/uL — ABNORMAL LOW (ref 4.0–10.5)
nRBC: 0 % (ref 0.0–0.2)

## 2022-04-16 LAB — BASIC METABOLIC PANEL
Anion gap: 14 (ref 5–15)
BUN: 12 mg/dL (ref 8–23)
CO2: 20 mmol/L — ABNORMAL LOW (ref 22–32)
Calcium: 9 mg/dL (ref 8.9–10.3)
Chloride: 101 mmol/L (ref 98–111)
Creatinine, Ser: 0.58 mg/dL (ref 0.44–1.00)
GFR, Estimated: >60 mL/min (ref >60)
Glucose, Bld: 79 mg/dL (ref 70–99)
Potassium: 4.1 mmol/L (ref 3.5–5.1)
Sodium: 135 mmol/L (ref 135–145)

## 2022-04-16 SURGERY — MINOR HARDWARE REMOVAL
Anesthesia: General | Laterality: Right

## 2022-04-16 MED ORDER — CHLORHEXIDINE GLUCONATE 0.12 % MT SOLN
15.0000 mL | OROMUCOSAL | Status: AC
  Filled 2022-04-16: qty 15

## 2022-04-16 MED ORDER — CHLORHEXIDINE GLUCONATE 0.12 % MT SOLN
OROMUCOSAL | Status: AC
  Administered 2022-04-16: 15 mL via OROMUCOSAL
  Filled 2022-04-16: qty 15

## 2022-04-16 MED ORDER — CEFAZOLIN SODIUM-DEXTROSE 1-4 GM/50ML-% IV SOLN
1.0000 g | Freq: Four times a day (QID) | INTRAVENOUS | Status: DC
  Administered 2022-04-16 – 2022-04-17 (×2): 1 g via INTRAVENOUS
  Filled 2022-04-16 (×2): qty 50

## 2022-04-16 MED ORDER — HYDROMORPHONE HCL 1 MG/ML IJ SOLN
INTRAMUSCULAR | Status: AC
  Filled 2022-04-16: qty 1

## 2022-04-16 MED ORDER — CYCLOSPORINE 0.05 % OP EMUL
1.0000 [drp] | Freq: Two times a day (BID) | OPHTHALMIC | Status: DC
  Administered 2022-04-16 – 2022-04-17 (×2): 1 [drp] via OPHTHALMIC
  Filled 2022-04-16 (×3): qty 30

## 2022-04-16 MED ORDER — ONDANSETRON HCL 4 MG/2ML IJ SOLN
INTRAMUSCULAR | Status: DC | PRN
  Administered 2022-04-16: 4 mg via INTRAVENOUS

## 2022-04-16 MED ORDER — METHOCARBAMOL 1000 MG/10ML IJ SOLN
500.0000 mg | Freq: Four times a day (QID) | INTRAVENOUS | Status: DC | PRN
  Administered 2022-04-17: 500 mg via INTRAVENOUS
  Filled 2022-04-16 (×2): qty 5

## 2022-04-16 MED ORDER — ONDANSETRON HCL 4 MG/2ML IJ SOLN: 4.0000 mg | Freq: Once | INTRAMUSCULAR | Status: DC | PRN

## 2022-04-16 MED ORDER — PROPOFOL 10 MG/ML IV BOLUS
INTRAVENOUS | Status: AC
  Filled 2022-04-16: qty 20

## 2022-04-16 MED ORDER — LIDOCAINE 2% (20 MG/ML) 5 ML SYRINGE
INTRAMUSCULAR | Status: DC | PRN
  Administered 2022-04-16: 60 mg via INTRAVENOUS

## 2022-04-16 MED ORDER — AMPHETAMINE-DEXTROAMPHET ER 20 MG PO CP24: 20.0000 mg | ORAL_CAPSULE | ORAL | Status: DC

## 2022-04-16 MED ORDER — OXYCODONE HCL 5 MG PO TABS: 5.0000 mg | ORAL_TABLET | Freq: Once | ORAL | Status: DC | PRN

## 2022-04-16 MED ORDER — DIPHENHYDRAMINE HCL 12.5 MG/5ML PO ELIX: 12.5000 mg | ORAL_SOLUTION | ORAL | Status: DC | PRN

## 2022-04-16 MED ORDER — HYDROMORPHONE HCL 1 MG/ML IJ SOLN
0.5000 mg | Freq: Four times a day (QID) | INTRAMUSCULAR | Status: DC | PRN
  Administered 2022-04-16 – 2022-04-17 (×2): 0.5 mg via INTRAVENOUS
  Filled 2022-04-16 (×3): qty 0.5

## 2022-04-16 MED ORDER — ONDANSETRON HCL 4 MG PO TABS: 4.0000 mg | ORAL_TABLET | Freq: Four times a day (QID) | ORAL | Status: DC | PRN

## 2022-04-16 MED ORDER — LIDOCAINE 2% (20 MG/ML) 5 ML SYRINGE
INTRAMUSCULAR | Status: AC
  Filled 2022-04-16: qty 5

## 2022-04-16 MED ORDER — NYSTATIN 100000 UNIT/ML MT SUSP: 5.0000 mL | Freq: Four times a day (QID) | OROMUCOSAL | Status: DC

## 2022-04-16 MED ORDER — CEFAZOLIN SODIUM-DEXTROSE 2-4 GM/100ML-% IV SOLN
2.0000 g | INTRAVENOUS | Status: AC
  Administered 2022-04-16: 2 g via INTRAVENOUS
  Filled 2022-04-16: qty 100

## 2022-04-16 MED ORDER — CEFAZOLIN SODIUM-DEXTROSE 1-4 GM/50ML-% IV SOLN: 1.0000 g | Freq: Four times a day (QID) | INTRAVENOUS | Status: DC

## 2022-04-16 MED ORDER — PROPOFOL 10 MG/ML IV BOLUS
INTRAVENOUS | Status: DC | PRN
  Administered 2022-04-16: 110 mg via INTRAVENOUS

## 2022-04-16 MED ORDER — MIDAZOLAM HCL 2 MG/2ML IJ SOLN
INTRAMUSCULAR | Status: AC
  Filled 2022-04-16: qty 2

## 2022-04-16 MED ORDER — METHOCARBAMOL 500 MG PO TABS
500.0000 mg | ORAL_TABLET | Freq: Four times a day (QID) | ORAL | Status: DC | PRN
  Administered 2022-04-16: 500 mg via ORAL
  Filled 2022-04-16: qty 1

## 2022-04-16 MED ORDER — ONDANSETRON HCL 4 MG/2ML IJ SOLN: 4.0000 mg | Freq: Four times a day (QID) | INTRAMUSCULAR | Status: DC | PRN

## 2022-04-16 MED ORDER — FENTANYL CITRATE (PF) 250 MCG/5ML IJ SOLN
INTRAMUSCULAR | Status: DC | PRN
  Administered 2022-04-16 (×3): 50 ug via INTRAVENOUS
  Administered 2022-04-16: 25 ug via INTRAVENOUS

## 2022-04-16 MED ORDER — METOCLOPRAMIDE HCL 5 MG PO TABS: 5.0000 mg | ORAL_TABLET | Freq: Three times a day (TID) | ORAL | Status: DC | PRN

## 2022-04-16 MED ORDER — FENTANYL CITRATE (PF) 250 MCG/5ML IJ SOLN
INTRAMUSCULAR | Status: AC
  Filled 2022-04-16: qty 5

## 2022-04-16 MED ORDER — OXYCODONE HCL 5 MG PO TABS
20.0000 mg | ORAL_TABLET | Freq: Four times a day (QID) | ORAL | Status: DC | PRN
  Administered 2022-04-16 – 2022-04-17 (×3): 20 mg via ORAL
  Filled 2022-04-16 (×3): qty 4

## 2022-04-16 MED ORDER — VERAPAMIL HCL ER 120 MG PO TBCR
120.0000 mg | EXTENDED_RELEASE_TABLET | Freq: Every day | ORAL | Status: DC
  Administered 2022-04-16: 120 mg via ORAL
  Filled 2022-04-16 (×2): qty 1

## 2022-04-16 MED ORDER — 0.9 % SODIUM CHLORIDE (POUR BTL) OPTIME
TOPICAL | Status: DC | PRN
  Administered 2022-04-16: 1000 mL

## 2022-04-16 MED ORDER — PREGABALIN 100 MG PO CAPS
300.0000 mg | ORAL_CAPSULE | Freq: Two times a day (BID) | ORAL | Status: DC
  Administered 2022-04-16 – 2022-04-17 (×2): 300 mg via ORAL
  Filled 2022-04-16 (×2): qty 3

## 2022-04-16 MED ORDER — OXYCODONE HCL 5 MG/5ML PO SOLN: 5.0000 mg | Freq: Once | ORAL | Status: DC | PRN

## 2022-04-16 MED ORDER — HYDROMORPHONE HCL 1 MG/ML IJ SOLN
0.2500 mg | INTRAMUSCULAR | Status: DC | PRN
  Administered 2022-04-16 (×3): 0.5 mg via INTRAVENOUS

## 2022-04-16 MED ORDER — OXYCODONE HCL 5 MG PO TABS
ORAL_TABLET | ORAL | 0 refills | Status: DC
  Filled 2022-04-16: qty 20, fill #0
  Filled 2022-04-17: qty 20, 4d supply, fill #0

## 2022-04-16 MED ORDER — MIDAZOLAM HCL 2 MG/2ML IJ SOLN
INTRAMUSCULAR | Status: DC | PRN
  Administered 2022-04-16: 2 mg via INTRAVENOUS

## 2022-04-16 MED ORDER — LACTATED RINGERS IV SOLN: INTRAVENOUS | Status: DC

## 2022-04-16 MED ORDER — VALACYCLOVIR HCL 500 MG PO TABS
1000.0000 mg | ORAL_TABLET | Freq: Every day | ORAL | Status: DC
  Administered 2022-04-16 – 2022-04-17 (×2): 1000 mg via ORAL
  Filled 2022-04-16 (×2): qty 2

## 2022-04-16 MED ORDER — FENTANYL CITRATE (PF) 100 MCG/2ML IJ SOLN
INTRAMUSCULAR | Status: AC
  Filled 2022-04-16: qty 2

## 2022-04-16 MED ORDER — METOCLOPRAMIDE HCL 5 MG/ML IJ SOLN: 5.0000 mg | Freq: Three times a day (TID) | INTRAMUSCULAR | Status: DC | PRN

## 2022-04-16 MED ORDER — DOCUSATE SODIUM 100 MG PO CAPS
100.0000 mg | ORAL_CAPSULE | Freq: Two times a day (BID) | ORAL | Status: DC
  Administered 2022-04-16 – 2022-04-17 (×2): 100 mg via ORAL
  Filled 2022-04-16 (×2): qty 1

## 2022-04-16 MED ORDER — ASPIRIN 325 MG PO TBEC: 325.0000 mg | DELAYED_RELEASE_TABLET | Freq: Every day | ORAL | Status: DC

## 2022-04-16 MED ORDER — CELECOXIB 200 MG PO CAPS
200.0000 mg | ORAL_CAPSULE | Freq: Two times a day (BID) | ORAL | Status: DC
  Administered 2022-04-16 – 2022-04-17 (×2): 200 mg via ORAL
  Filled 2022-04-16 (×2): qty 1

## 2022-04-16 SURGICAL SUPPLY — 52 items
APL PRP STRL LF DISP 70% ISPRP (MISCELLANEOUS) ×1
BANDAGE ESMARK 6X9 LF (GAUZE/BANDAGES/DRESSINGS) IMPLANT
BLADE SURG 15 STRL LF DISP TIS (BLADE) ×2 IMPLANT
BLADE SURG 15 STRL SS (BLADE) ×2
BNDG CMPR 5X62 HK CLSR LF (GAUZE/BANDAGES/DRESSINGS) ×1
BNDG CMPR 9X6 STRL LF SNTH (GAUZE/BANDAGES/DRESSINGS)
BNDG ELASTIC 4X5.8 VLCR STR LF (GAUZE/BANDAGES/DRESSINGS) IMPLANT
BNDG ELASTIC 6INX 5YD STR LF (GAUZE/BANDAGES/DRESSINGS) IMPLANT
BNDG ELASTIC 6X5.8 VLCR STR LF (GAUZE/BANDAGES/DRESSINGS) IMPLANT
BNDG ESMARK 6X9 LF (GAUZE/BANDAGES/DRESSINGS)
CHLORAPREP W/TINT 26 (MISCELLANEOUS) ×1 IMPLANT
CUFF TOURN SGL QUICK 34 (TOURNIQUET CUFF)
CUFF TRNQT CYL 34X4.125X (TOURNIQUET CUFF) IMPLANT
DRAPE C-ARM 42X120 X-RAY (DRAPES) IMPLANT
DRAPE IMP U-DRAPE 54X76 (DRAPES) ×1 IMPLANT
DRAPE OEC MINIVIEW 54X84 (DRAPES) ×1 IMPLANT
DRAPE U-SHAPE 47X51 STRL (DRAPES) ×1 IMPLANT
DRSG XEROFORM 1X8 (GAUZE/BANDAGES/DRESSINGS) IMPLANT
ELECT REM PT RETURN 9FT ADLT (ELECTROSURGICAL) ×1
ELECTRODE REM PT RTRN 9FT ADLT (ELECTROSURGICAL) ×1 IMPLANT
GAUZE SPONGE 4X4 12PLY STRL (GAUZE/BANDAGES/DRESSINGS) ×1 IMPLANT
GLOVE BIOGEL M STRL SZ7.5 (GLOVE) ×1 IMPLANT
GLOVE BIOGEL PI IND STRL 8 (GLOVE) ×1 IMPLANT
GOWN STRL REUS W/ TWL LRG LVL3 (GOWN DISPOSABLE) ×1 IMPLANT
GOWN STRL REUS W/ TWL XL LVL3 (GOWN DISPOSABLE) ×1 IMPLANT
GOWN STRL REUS W/TWL LRG LVL3 (GOWN DISPOSABLE) ×1
GOWN STRL REUS W/TWL XL LVL3 (GOWN DISPOSABLE) ×1
KIT BASIN OR (CUSTOM PROCEDURE TRAY) ×1 IMPLANT
MATRIX WOUND 3-LAYER 5X5 (Tissue) IMPLANT
NDL HYPO 25X1 1.5 SAFETY (NEEDLE) IMPLANT
NEEDLE HYPO 25X1 1.5 SAFETY (NEEDLE) IMPLANT
NS IRRIG 1000ML POUR BTL (IV SOLUTION) ×1 IMPLANT
PACK ORTHO EXTREMITY (CUSTOM PROCEDURE TRAY) ×1 IMPLANT
PAD CAST 4YDX4 CTTN HI CHSV (CAST SUPPLIES) IMPLANT
PADDING CAST COTTON 4X4 STRL (CAST SUPPLIES) ×1
PADDING CAST SYNTHETIC 4X4 STR (CAST SUPPLIES) IMPLANT
SHEET MEDIUM DRAPE 40X70 STRL (DRAPES) ×1 IMPLANT
SLEEVE SCD COMPRESS KNEE MED (STOCKING) ×1 IMPLANT
SPLINT PLASTER CAST FAST 4X15 (CAST SUPPLIES) IMPLANT
SPONGE T-LAP 18X18 ~~LOC~~+RFID (SPONGE) IMPLANT
STOCKINETTE 6  STRL (DRAPES) ×1
STOCKINETTE 6 STRL (DRAPES) ×1 IMPLANT
STRIP CLOSURE SKIN 1/2X4 (GAUZE/BANDAGES/DRESSINGS) IMPLANT
SUCTION FRAZIER HANDLE 10FR (MISCELLANEOUS)
SUCTION TUBE FRAZIER 10FR DISP (MISCELLANEOUS) IMPLANT
SUT ETHILON 2 0 FS 18 (SUTURE) IMPLANT
SUT MNCRL AB 3-0 PS2 18 (SUTURE) IMPLANT
SUT PDS AB 2-0 CT2 27 (SUTURE) ×1 IMPLANT
SYR CONTROL 10ML LL (SYRINGE) IMPLANT
TOWEL GREEN STERILE FF (TOWEL DISPOSABLE) ×2 IMPLANT
TUBE CONNECTING 20X1/4 (TUBING) IMPLANT
UNDERPAD 30X36 HEAVY ABSORB (UNDERPADS AND DIAPERS) ×1 IMPLANT

## 2022-04-16 NOTE — Anesthesia Preprocedure Evaluation (Signed)
Anesthesia Evaluation  Patient identified by MRN, date of birth, ID band Patient awake    Reviewed: Allergy & Precautions, NPO status , Patient's Chart, lab work & pertinent test results  History of Anesthesia Complications Negative for: history of anesthetic complications  Airway Mallampati: II  TM Distance: >3 FB Neck ROM: Full    Dental no notable dental hx. (+) Dental Advisory Given   Pulmonary pneumonia, resolved   Pulmonary exam normal        Cardiovascular negative cardio ROS Normal cardiovascular exam     Neuro/Psych  Headaches PSYCHIATRIC DISORDERS  Depression     Neuromuscular disease    GI/Hepatic negative GI ROS,,,(+) Hepatitis -, C  Endo/Other  negative endocrine ROS    Renal/GU negative Renal ROS  negative genitourinary   Musculoskeletal  (+) Arthritis , Osteoarthritis,  Wound dehiscence right ankle Retained hardware right ankle    Abdominal   Peds negative pediatric ROS (+)  Hematology  (+) Blood dyscrasia, anemia   Anesthesia Other Findings   Reproductive/Obstetrics negative OB ROS                              Anesthesia Physical Anesthesia Plan  ASA: 2  Anesthesia Plan: General   Post-op Pain Management:    Induction: Intravenous  PONV Risk Score and Plan: 4 or greater and Ondansetron, Dexamethasone, Midazolam and Treatment may vary due to age or medical condition  Airway Management Planned: Oral ETT and LMA  Additional Equipment: None  Intra-op Plan:   Post-operative Plan: Extubation in OR  Informed Consent: I have reviewed the patients History and Physical, chart, labs and discussed the procedure including the risks, benefits and alternatives for the proposed anesthesia with the patient or authorized representative who has indicated his/her understanding and acceptance.     Dental advisory given  Plan Discussed with: Anesthesiologist, CRNA and  Surgeon  Anesthesia Plan Comments:          Anesthesia Quick Evaluation

## 2022-04-16 NOTE — Transfer of Care (Signed)
Immediate Anesthesia Transfer of Care Note  Patient: [name]  Procedure(s) Performed: REPAIR OF SURGICAL WOUND DEHISCENCE RIGHT ANKLE, REMOVAL OF DEEP ORTHOPEDIC HARDWARE (Right) APPLICATION OF SKIN SUBSTITUTE GRAFT (Right)  Patient Location: PACU  Anesthesia Type:General  Level of Consciousness: awake, alert , and oriented  Airway & Oxygen Therapy: Patient Spontanous Breathing  Post-op Assessment: Report given to RN, Post -op Vital signs reviewed and stable, Patient moving all extremities X 4, and Patient able to stick tongue midline  Post vital signs: Reviewed  Last Vitals:  Vitals Value Taken Time  BP 142/82 04/16/22 1726  Temp 97.6   Pulse 72 04/16/22 1728  Resp 15 04/16/22 1728  SpO2 100 % 04/16/22 1728  Vitals shown include unvalidated device data.  Last Pain:  Vitals:   04/16/22 1408  TempSrc:   PainSc: 7       Patients Stated Pain Goal: 5 ([id number])  Complications: No notable events documented.

## 2022-04-16 NOTE — Progress Notes (Signed)
Patient seen in the PACU. She requested to stay over night for observation as she did not have a plan for discharge home today. She will be admitted with plan for discharge home tomorrow AM.    Baseline oxycodone 20mg  tablets for pain control with Dilaudid PRN sparingly for breakthrough pain.   NWB operative extremity in splint with walker. Keep splint intact.   She will resume baseline aspirin tomorrow which will serv e for DVT prophylaxis.

## 2022-04-16 NOTE — Anesthesia Procedure Notes (Signed)
Procedure Name: LMA Insertion Date/Time: 04/16/2022 4:40 PM  Performed by: Maude Leriche, CRNAPre-anesthesia Checklist: Patient identified, Emergency Drugs available, Suction available, Patient being monitored and Timeout performed Patient Re-evaluated:Patient Re-evaluated prior to induction Oxygen Delivery Method: Circle system utilized Preoxygenation: Pre-oxygenation with 100% oxygen Induction Type: IV induction LMA: LMA inserted LMA Size: 4.0 Number of attempts: 1 Placement Confirmation: positive ETCO2 and breath sounds checked- equal and bilateral Tube secured with: Tape Dental Injury: Teeth and Oropharynx as per pre-operative assessment

## 2022-04-16 NOTE — H&P (Signed)
PREOPERATIVE H&P  Chief Complaint: right ankle wound with exposed orthopaedic hardware  HPI: [name] is a 67 y.o. female who presents for preoperative history and physical with a diagnosis of right ankle wound with exposed orthopedic hardware.  She is here today for hardware removal and treatment of her wound with a skin substitute with addressing the dehiscence. Symptoms are rated as moderate to severe, and have been worsening.  This is significantly impairing activities of daily living.  She has elected for surgical management.   Past Medical History:  Diagnosis Date   ADHD (attention deficit hyperactivity disorder)    ARDS (adult respiratory distress syndrome) ([place]) 2007   Arthritis    Depression    Gallstones 01/07/2018   Ganglion cyst of dorsum of right wrist    Headache    Hepatitis    2004 non detectable now   MRSA (methicillin resistant Staphylococcus aureus) 2007   Osteoporosis    Pneumonia 2009   Prosthetic eye globe    right eye    Raynaud's disease    Past Surgical History:  Procedure Laterality Date   ABDOMINAL HYSTERECTOMY     ANTERIOR CERVICAL DECOMP/DISCECTOMY FUSION N/A 12/12/2014   Procedure: ANTERIOR CERVICAL DECOMPRESSION/DISCECTOMY FUSION 3 LEVELS;  Surgeon: Phylliss Bob, MD;  Location: [facility];  Service: Orthopedics;  Laterality: N/A;  Anterior cervical decompression fusion, cerivcal 5-6, cervical 6-7, cervical 7-thoracic 1 with instrumentation and allografrt   APPENDECTOMY  1974   CATARACT EXTRACTION Left 11/2018   CHOLECYSTECTOMY N/A 01/07/2018   Procedure: LAPAROSCOPIC CHOLECYSTECTOMY WITH INTRAOPERATIVE CHOLANGIOGRAM;  Surgeon: Fanny Skates, MD;  Location: [facility];  Service: General;  Laterality: N/A;   COLONOSCOPY  04/14/2013   per Dr. Ardis Hughs, clear, repeat in 10 yrs    CORNEAL TRANSPLANT Right 2007   x4   ENUCLEATION Right 2013   eye infection   GANGLION CYST EXCISION Right 05/25/2016   Procedure: RIGHT WRIST ULNAR GANGLION EXCISION;   Surgeon: Milly Jakob, MD;  Location: [facility];  Service: Orthopedics;  Laterality: Right;   ORIF CLAVICULAR FRACTURE Left 02/03/2022   Procedure: OPEN REDUCTION INTERNAL FIXATION (ORIF) LEFT CLAVICULAR FRACTURE WITH CORACOLAVICULAR FIXATION;  Surgeon: Tania Ade, MD;  Location: [facility];  Service: Orthopedics;  Laterality: Left;  BIOMET ZIP LOOP WITH CLAVICLE PLATES   ORIF FIBULA FRACTURE Right 02/03/2022   Procedure: OPEN TREATMENT OF RIGHT PILON ANKLE FRACTURE INCLUDING LATERAL MALLEOLUS FRACTURE;  Surgeon: Erle Crocker, MD;  Location: [facility];  Service: Orthopedics;  Laterality: Right;  LENGTH OF SURGERY: 120 MINUTES   REVERSE SHOULDER ARTHROPLASTY Right 07/10/2021   Procedure: REVERSE SHOULDER ARTHROPLASTY;  Surgeon: Tania Ade, MD;  Location: [facility];  Service: Orthopedics;  Laterality: Right;   RHINOPLASTY  1980   ROBOTIC ASSISTED LAPAROSCOPIC SACROCOLPOPEXY Bilateral 06/13/2020   Procedure: XI ROBOTIC ASSISTED LAPAROSCOPIC SACROCOLPOPEXY AND SUPRACERVICAL HYSTERECTOMY AND BILATERAL  SALPINGO OOPHERECTOMY;  Surgeon: Ardis Hughs, MD;  Location: [facility];  Service: Urology;  Laterality: Bilateral;   SYNDESMOSIS REPAIR Right 02/03/2022   Procedure: POSSIBLE OPEN TREATMENT RIGHT SYNDESMOSIS;  Surgeon: Erle Crocker, MD;  Location: [facility];  Service: Orthopedics;  Laterality: Right;   TONSILLECTOMY     removed in 3rd grade   TOTAL SHOULDER ARTHROPLASTY Left 03/13/2019   Procedure: TOTAL SHOULDER ARTHROPLASTY;  Surgeon: Tania Ade, MD;  Location: [facility];  Service: Orthopedics;  Laterality: Left;   Social History   Socioeconomic History   Marital status: Single    Spouse name: Not on  file   Number of children: Not on file   Years of education: Not on file   Highest education level: Bachelor's degree (e.g., BA, AB, BS)  Occupational History   Not on file  Tobacco Use   Smoking status: Never   Smokeless tobacco: Never  Vaping  Use   Vaping Use: Never used  Substance and Sexual Activity   Alcohol use: Yes    Comment: occ   Drug use: No   Sexual activity: Never    Birth control/protection: Post-menopausal  Other Topics Concern   Not on file  Social History Narrative   Not on file   Social Determinants of Health   Financial Resource Strain: Low Risk  (08/14/2021)   Overall Financial Resource Strain (CARDIA)    Difficulty of Paying Living Expenses: Not hard at all  Food Insecurity: No Food Insecurity (08/14/2021)   Hunger Vital Sign    Worried About Running Out of Food in the Last Year: Never true    Ran Out of Food in the Last Year: Never true  Transportation Needs: No Transportation Needs (08/14/2021)   PRAPARE - Hydrologist (Medical): No    Lack of Transportation (Non-Medical): No  Physical Activity: Sufficiently Active (08/14/2021)   Exercise Vital Sign    Days of Exercise per Week: 4 days    Minutes of Exercise per Session: 90 min  Stress: No Stress Concern Present (08/14/2021)   [place]    Feeling of Stress : Not at all  Social Connections: Moderately Isolated (08/14/2021)   Social Connection and Isolation Panel [NHANES]    Frequency of Communication with Friends and Family: More than three times a week    Frequency of Social Gatherings with Friends and Family: Once a week    Attends Religious Services: More than 4 times per year    Active Member of [employer] or Organizations: No    Attends Music therapist: Not on file    Marital Status: Divorced   Family History  Problem Relation Age of Onset   Breast cancer Maternal Aunt 60   Colon cancer Neg Hx    No Known Allergies Prior to Admission medications   Medication Sig Start Date End Date Taking? Authorizing Provider  amphetamine-dextroamphetamine (ADDERALL XR) 20 MG 24 hr capsule Take 1 capsule (20 mg total) by mouth every morning. 03/26/22  04/25/22 Yes Laurey Morale, MD  celecoxib (CELEBREX) 200 MG capsule Take 1 capsule (200 mg total) by mouth 2 (two) times daily. 02/23/22  Yes Laurey Morale, MD  cephALEXin (KEFLEX) 500 MG capsule Take 1 capsule (500 mg total) by mouth every 6 (six) hours. Patient taking differently: Take 500 mg by mouth 4 (four) times daily. 04/10/22  Yes   cycloSPORINE (RESTASIS) 0.05 % ophthalmic emulsion Place 1 drop into both eyes 2 times daily. Patient taking differently: Place 1 drop into both eyes 2 (two) times daily. 06/25/21  Yes   estradiol (ESTRACE VAGINAL) 0.1 MG/GM vaginal cream Apply a fingertip amount to urethra 2 times per week. Patient taking differently: Place 1 Applicatorful vaginally once a week. 09/30/21  Yes   Oxycodone HCl 20 MG TABS Take 1 tablet (20 mg total) by mouth every 6 (six) hours as needed (pain). 05/01/22 05/31/22 Yes Laurey Morale, MD  polyethylene glycol powder (GLYCOLAX/MIRALAX) powder TAKE 17 GRAMS BY MOUTH 2 TIMES DAILY AS NEEDED. Patient taking differently: Take 17 g by mouth 2 (  two) times a week. 02/02/17  Yes Laurey Morale, MD  pregabalin (LYRICA) 300 MG capsule Take 1 capsule (300 mg total) by mouth 2 (two) times daily. 11/13/21  Yes Laurey Morale, MD  valACYclovir (VALTREX) 1000 MG tablet Take 1 tablet (1,000 mg total) by mouth 3 (three) times daily. Patient taking differently: Take 1,000 mg by mouth daily. 12/09/21  Yes Laurey Morale, MD  verapamil (CALAN-SR) 120 MG CR tablet Take 1 tablet (120 mg total) by mouth at bedtime. 03/25/22  Yes Laurey Morale, MD  aspirin EC 325 MG tablet Take 1 tablet (325 mg total) by mouth daily. Patient not taking: Reported on 04/15/2022 02/06/22 02/06/23  [place], [employer], PA-C  nystatin (MYCOSTATIN) 100000 UNIT/ML suspension Take 5 mLs (500,000 Units total) by mouth 4 (four) times daily. Patient not taking: Reported on 04/15/2022 04/11/22   Inda Coke, PA  phenazopyridine (PYRIDIUM) 200 MG tablet Take 1 tablet (200 mg total) by mouth 3  (three) times daily as needed. Patient not taking: Reported on 04/15/2022 09/30/21     valACYclovir (VALTREX) 1000 MG tablet Take 1 tablet by mouth once daily. Patient not taking: Reported on 04/15/2022 05/07/21        Positive ROS: All other systems have been reviewed and were otherwise negative with the exception of those mentioned in the HPI and as above.  Physical Exam:  Vitals:   04/16/22 1316  BP: (!) 153/79  Pulse: 70  Resp: 18  Temp: 98.3 F (36.8 C)  SpO2: 97%   General: Alert, no acute distress Cardiovascular: No pedal edema Respiratory: No cyanosis, no use of accessory musculature GI: No organomegaly, abdomen is soft and non-tender Skin: No lesions in the area of chief complaint Neurologic: Sensation intact distally Psychiatric: Patient is competent for consent with normal mood and affect Lymphatic: No axillary or cervical lymphadenopathy  MUSCULOSKELETAL: Right ankle demonstrates exposed hardware distally along the lateral aspect of the fibula.  Rest the wound is healed.  There are some retraction of the skin edges.  Assessment: Right exposed orthopedic hardware through wound dehiscence and skin loss   Plan: Plan for removal of orthopedic hardware with repair of surgical wound dehiscence and application of skin graft substitute.  We discussed the risks, benefits and alternatives of surgery which include but are not limited to wound healing complications, infection, nonunion, malunion, need for further surgery, damage to surrounding structures and continued pain.  They understand there is no guarantees to an acceptable outcome.  After weighing these risks they opted to proceed with surgery.     Erle Crocker, MD    04/16/2022 3:31 PM

## 2022-04-16 NOTE — Discharge Instructions (Signed)

## 2022-04-17 ENCOUNTER — Other Ambulatory Visit: Payer: Commercial Managed Care - PPO

## 2022-04-17 ENCOUNTER — Encounter (HOSPITAL_COMMUNITY): Payer: Self-pay | Admitting: Orthopaedic Surgery

## 2022-04-17 ENCOUNTER — Other Ambulatory Visit: Payer: Self-pay

## 2022-04-17 ENCOUNTER — Other Ambulatory Visit (HOSPITAL_COMMUNITY): Payer: Self-pay

## 2022-04-17 DIAGNOSIS — M25571 Pain in right ankle and joints of right foot: Secondary | ICD-10-CM | POA: Diagnosis not present

## 2022-04-17 DIAGNOSIS — F32A Depression, unspecified: Secondary | ICD-10-CM | POA: Diagnosis not present

## 2022-04-17 DIAGNOSIS — S82891D Other fracture of right lower leg, subsequent encounter for closed fracture with routine healing: Secondary | ICD-10-CM | POA: Diagnosis not present

## 2022-04-17 DIAGNOSIS — T8131XA Disruption of external operation (surgical) wound, not elsewhere classified, initial encounter: Secondary | ICD-10-CM | POA: Diagnosis not present

## 2022-04-17 MED ORDER — TIZANIDINE HCL 2 MG PO TABS
ORAL_TABLET | ORAL | 1 refills | Status: DC
  Filled 2022-04-17: qty 60, fill #0
  Filled 2022-04-17: qty 60, 20d supply, fill #0
  Filled 2022-05-07: qty 60, 20d supply, fill #1

## 2022-04-17 MED ORDER — HYDROMORPHONE HCL 1 MG/ML IJ SOLN
0.5000 mg | INTRAMUSCULAR | Status: DC | PRN
  Administered 2022-04-17: 0.5 mg via INTRAVENOUS
  Filled 2022-04-17: qty 0.5

## 2022-04-17 NOTE — Evaluation (Signed)
Physical Therapy Evaluation and Discharge Patient Details Name: [name] MRN: [id number] DOB: 08-24-1955 Today's Date: 04/17/2022  History of Present Illness  Pt is a 67 y/o female who presents 3/21 s/p R pilon ankle fracture with associated lateral malleolus fracture, and L clavicle fracture. She is now s/p ORIF of both fractures and is NWB on the RLE and LUE. PMH significant for ADHD, ARDS, Hepatitis, osteoporosis, PNA, R prosthetic eye, Raynaud's disease. ACDF 2016, L TSA 2021.  Clinical Impression  Patient evaluated by Physical Therapy with no further acute PT needs identified. All education has been completed and the patient has no further questions. Mod I with RW, ambulating household distances while maintaining NWB through RLE. No physical assist required during session. Pt feels at baseline. Feels confident, and will arrange help with IADLs such as laundry. All questions answered. See below for any follow-up Physical Therapy or equipment needs. PT is signing off. Thank you for this referral.        Recommendations for follow up therapy are one component of a multi-disciplinary discharge planning process, led by the attending physician.  Recommendations may be updated based on patient status, additional functional criteria and insurance authorization.  Follow Up Recommendations No PT follow up (Consider OPPT after ortho follow-up.)      Assistance Recommended at Discharge PRN  Patient can return home with the following  Assistance with cooking/housework;Assist for transportation (Has a friend that can assist with laundry)    Equipment Recommendations Other (comment) (Single crutch - only given 1 crutch for at prior admission, uses outdoors for transfers.)  Recommendations for Other Services       Functional Status Assessment Patient has had a recent decline in their functional status and demonstrates the ability to make significant improvements in function in a  reasonable and predictable amount of time.     Precautions / Restrictions Precautions Precautions: Fall Restrictions Weight Bearing Restrictions: Yes RLE Weight Bearing: Non weight bearing      Mobility  Bed Mobility Overal bed mobility: Modified Independent             General bed mobility comments: good maintenance of precautions    Transfers Overall transfer level: Modified independent                 General transfer comment: Good RW control, stood from bed without issues, maintains NWB, sits in recliner without issues.    Ambulation/Gait Ambulation/Gait assistance: Supervision, Modified independent (Device/Increase time) Gait Distance (Feet): 55 Feet Assistive device: Rolling walker (2 wheels) Gait Pattern/deviations:  (hop) Gait velocity: decr Gait velocity interpretation: <1.8 ft/sec, indicate of risk for recurrent falls   General Gait Details: Supervision for initial distance out of room, progressed to mod I on return. No physical assist needed, safely manages RW. Pt reports confidence with this device.  Stairs            Wheelchair Mobility    Modified Rankin (Stroke Patients Only)       Balance Overall balance assessment: Mild deficits observed, not formally tested                                           Pertinent Vitals/Pain Pain Assessment Faces Pain Scale: Hurts little more Pain Location: R buttock/sciatic nerve area Pain Descriptors / Indicators: Aching, Burning, Discomfort    Home Living Family/patient expects to be discharged to:: Private  residence Living Arrangements: Parent Available Help at Discharge: Family;Available PRN/intermittently (lives with mother who has dementia; neighbor can assist as needed, sister in law intermittently; friend can give her rides.) Type of Home: House Home Access: Ramped entrance       Home Layout: One level Home Equipment: Shower seat;Cane - single point;Rolling Environmental consultant  (2 wheels);Rollator (4 wheels);Transport chair;BSC/3in1;Other (comment) (knee scooter; only has one crutch)      Prior Function Prior Level of Function : Needs assist             Mobility Comments: Has been using the knee scooter for the last couple weeks since ankle fx. Transfers with single crutch       Hand Dominance   Dominant Hand: Right    Extremity/Trunk Assessment   Upper Extremity Assessment Upper Extremity Assessment: Defer to OT evaluation    Lower Extremity Assessment Lower Extremity Assessment: RLE deficits/detail RLE Deficits / Details: In splint RLE: Unable to fully assess due to immobilization       Communication   Communication: No difficulties  Cognition Arousal/Alertness: Awake/alert Behavior During Therapy: WFL for tasks assessed/performed Overall Cognitive Status: Within Functional Limits for tasks assessed                                 General Comments: Good recall of compensatory techniques she utilized in the home setting after prior surgery        General Comments General comments (skin integrity, edema, etc.): VSS    Exercises     Assessment/Plan    PT Assessment Patient does not need any further PT services  PT Problem List Decreased strength;Decreased range of motion;Decreased activity tolerance;Decreased balance;Decreased mobility;Decreased knowledge of use of DME;Decreased knowledge of precautions;Pain       PT Treatment Interventions DME instruction;Gait training;Functional mobility training;Therapeutic activities;Therapeutic exercise;Balance training;Neuromuscular re-education;Patient/family education;Modalities    PT Goals (Current goals can be found in the Care Plan section)  Acute Rehab PT Goals Patient Stated Goal: go home PT Goal Formulation: All assessment and education complete, DC therapy    Frequency       Co-evaluation               AM-PAC PT "6 Clicks" Mobility  Outcome Measure Help  needed turning from your back to your side while in a flat bed without using bedrails?: None Help needed moving from lying on your back to sitting on the side of a flat bed without using bedrails?: None Help needed moving to and from a bed to a chair (including a wheelchair)?: None Help needed standing up from a chair using your arms (e.g., wheelchair or bedside chair)?: None Help needed to walk in hospital room?: None Help needed climbing 3-5 steps with a railing? : A Little 6 Click Score: 23    End of Session Equipment Utilized During Treatment: Gait belt Activity Tolerance: Patient tolerated treatment well Patient left: in chair;with call bell/phone within reach;with chair alarm set;with SCD's reapplied Nurse Communication: Mobility status PT Visit Diagnosis: Other abnormalities of gait and mobility (R26.89);Difficulty in walking, not elsewhere classified (R26.2);Pain Pain - Right/Left: Right Pain - part of body: Ankle and joints of foot    Time: 0901-0921 PT Time Calculation (min) (ACUTE ONLY): 20 min   Charges:   PT Evaluation $PT Eval Low Complexity: Thorp, PT, DPT Physical Therapist Acute Rehabilitation Services [place]  [place]   Ellouise Newer 04/17/2022, 10:11 AM

## 2022-04-17 NOTE — Op Note (Signed)
[name] [facility] female 67 y.o. 04/16/2022   PreOperative Diagnosis: Right ankle surgical wound dehiscence Infected deep orthopedic hardware  PostOperative Diagnosis: same  PROCEDURE: Repair of surgical wound dehiscence right ankle Deep orthopedic hardware removal Application of skin substitute graft  SURGEON: Melony Overly, MD  ASSISTANT: Jesse Martinique, PA-C  ANESTHESIA: General  FINDINGS: Surgical wound dehiscence along the distal lateral aspect of the right ankle with underlying exposed hardware and skin loss  IMPLANTS: none  INDICATIONS:66 y.o. femaleunderwent open treatment of pilon ankle fracture approximately 2-1/2 months ago and had dehiscence and retraction of her lateral skin overlying the fibular plate.  The cause of the dehiscence is unknown but may be related to tight dressing or rubbing in the surgical boot.  She was placed on antibiotics prophylactically and surgical treatment of the wound and removal of hardware was performed.  X-rays demonstrated healing of the fracture.   Patient understood the risks, benefits and alternatives to surgery which include but are not limited to wound healing complications, infection, nonunion, malunion, need for further surgery as well as damage to surrounding structures. They also understood the potential for continued pain in that there were no guarantees of acceptable outcome After weighing these risks the patient opted to proceed with surgery.  PROCEDURE: Patient was identified in the preoperative holding area.  The right ankle was marked by myself.  Consent was signed by myself and the patient.   Patient was taken to the operative suite and placed supine on the operative table.  General anesthesia was induced without difficulty. Bump was placed under the operative hip and bone foam was used.  All bony prominences were well padded.  Preoperative antibiotics were given. The extremity was prepped and draped in the usual  sterile fashion and surgical timeout was performed.    We began by inspecting the wound.  There was approximately 3 cm x 2 cm of wound dehiscence along the lateral aspect of the fibula with exposed underlying hardware.  We began by extending the dehisced area of the wound more proximally and distally to gain access to the underlying hardware.  After the incision was carried down to the plate blunt dissection was used to elevate the soft tissue off of the plate and the plate was removed using a screwdriver and a Soil scientist to elevate the plate.  Then a Ronjair was used to smooth off the bony prominences and tissue was sent for culture.  The hardware was discarded.  We then proceeded to inspect the wound.  There was no gross infection.  There was skin loss.  The wound was irrigated copiously with normal saline. There was skin loss at the area of the wound dehiscence that was not amenable to primary closure.  Acell skin graft substitute was then placed overlying the lateral aspect of the fibula.  It was laid down overlying the fibula and was placed under the skin edges to aid with healing.  The area of surgical wound dehiscence was then treated and the skin edges of the retracted skin portion was incised and fibrous tissue was removed back to bleeding skin edges.  The wound was complex given the friability of the tissue and the area of skin loss.  Then we carefully repaired the area of skin dehiscence back to the skin graft substitute and primary closure was performed distally and proximally however there was a small area approximately 2 cm x 1 cm of exposed underlying Acell skin graft substitute overlying the fibula.  The closure  was complex in nature and multiple layers were used.  The wound was then irrigated further and bolstered soft dressing with nonadherent material was used overlying the wound.  The patient was then awakened from anesthesia and taken to recovery in stable condition.  No  complications.   POST OPERATIVE INSTRUCTIONS: Patient will be admitted for observation Once discharged he will follow-up in one week for wound check.   TOURNIQUET TIME:no tourniquet  BLOOD LOSS:  Minimal         DRAINS: none         SPECIMEN: none       COMPLICATIONS:  * No complications entered in OR log *         Disposition: PACU - hemodynamically stable.         Condition: stable

## 2022-04-17 NOTE — Anesthesia Postprocedure Evaluation (Signed)
Anesthesia Post Note  Patient: [name]  Procedure(s) Performed: REPAIR OF SURGICAL WOUND DEHISCENCE RIGHT ANKLE, REMOVAL OF DEEP ORTHOPEDIC HARDWARE (Right) APPLICATION OF SKIN SUBSTITUTE GRAFT (Right)     Patient location during evaluation: PACU Anesthesia Type: General Level of consciousness: awake and alert Pain management: pain level controlled Vital Signs Assessment: post-procedure vital signs reviewed and stable Respiratory status: spontaneous breathing, nonlabored ventilation, respiratory function stable and patient connected to nasal cannula oxygen Cardiovascular status: blood pressure returned to baseline and stable Postop Assessment: no apparent nausea or vomiting Anesthetic complications: no   No notable events documented.  Last Vitals:  Vitals:   04/16/22 2019 04/17/22 0417  BP: 120/74 (!) 95/54  Pulse: 79 69  Resp: 18 18  Temp: (!) 36.4 C   SpO2: 94% 95%    Last Pain:  Vitals:   04/17/22 0554  TempSrc:   PainSc: 7                  Tiajuana Amass

## 2022-04-17 NOTE — Progress Notes (Signed)
Patient refused to wait for crutches. Patient educated.

## 2022-04-17 NOTE — Progress Notes (Signed)
     [name] is a 67 y.o. female   Orthopaedic diagnosis: Right ankle surgical wound dehiscence, infected deep orthopedic hardware  Surgery 04/16/2022: Repair of surgical wound dehiscence right ankle Deep orthopedic hardware removal Application of skin substitute graft  Subjective: Patient doing well.  Looking forward to discharge home today.  She has not yet worked with physical therapy.  She denies any fever, chills, night sweats or feeling ill.  No shortness of breath or chest pain.  Pain reasonably controlled on current regimen of baseline 20 mg gram oxycodone every 6 hours with PRN Dilaudid.  She is complaining of some spasm type sensations in the right leg as well as radicular type pain from her low back where she has known radiculopathy.  Objectyive: Vitals:   04/16/22 2019 04/17/22 0417  BP: 120/74 (!) 95/54  Pulse: 79 69  Resp: 18 18  Temp: (!) 97.5 F (36.4 C)   SpO2: 94% 95%     Exam: Awake and alert Respirations even and unlabored No acute distress  Examination of the right lower extremity shows well-fitted, clean, dry lower extremity splint.  The splint was not removed.  Exposed skin is benign.  She is able to wiggle the toes.  She is able to gently flex and extend the hip and knee.  Proximal calf is soft and nontender.  Toes are warm and well-perfused distally.  Assessment: Postop day 1 status post the above, doing well and suitable for discharge home today.  She will work with physical therapy prior to discharge.   Plan:  -Nonweightbearing right lower extremity with walker, crutches, or knee scooter -Keep splint clean, dry, and intact -Continue baseline oxycodone 20 mg every 6 hours for pain control.  Will add 5 mg oxycodone as needed for breakthrough pain only -Tizanidine for spasms. -Resume baseline aspirin today.  Plan for outpatient follow-up 1 week from surgery with Dr. Scheryl Darter orthopedics for reevaluation of the wound out of the  splint.   Beverly Ullman J. Martinique, PA-C

## 2022-04-17 NOTE — Discharge Summary (Signed)
Patient ID: [name] MRN: [id number] DOB/AGE: Jan 16, 1956 67 y.o.  Admit date: 04/16/2022 Discharge date: 04/17/2022  Admission Diagnoses:  Principal Problem:   Right ankle pain   Discharge Diagnoses:  Same  Past Medical History:  Diagnosis Date   ADHD (attention deficit hyperactivity disorder)    ARDS (adult respiratory distress syndrome) ([place]) 2007   Arthritis    Depression    Gallstones 01/07/2018   Ganglion cyst of dorsum of right wrist    Headache    Hepatitis    2004 non detectable now   MRSA (methicillin resistant Staphylococcus aureus) 2007   Osteoporosis    Pneumonia 2009   Prosthetic eye globe    right eye    Raynaud's disease     Surgeries: Procedure(s): REPAIR OF SURGICAL WOUND DEHISCENCE RIGHT ANKLE, REMOVAL OF DEEP ORTHOPEDIC HARDWARE APPLICATION OF SKIN SUBSTITUTE GRAFT on 04/16/2022   Consultants:   Discharged Condition: Improved  Hospital Course: [name] is an 67 y.o. female who was admitted 04/16/2022 for operative treatment ofRight ankle pain. Patient has severe unremitting pain that affects sleep, daily activities, and work/hobbies. After pre-op clearance the patient was taken to the operating room on 04/16/2022 and underwent  Procedure(s): REPAIR OF SURGICAL WOUND DEHISCENCE RIGHT ANKLE, REMOVAL OF DEEP ORTHOPEDIC HARDWARE APPLICATION OF SKIN SUBSTITUTE GRAFT.    Patient was given perioperative antibiotics:  Anti-infectives (From admission, onward)    Start     Dose/Rate Route Frequency Ordered Stop   04/16/22 2300  ceFAZolin (ANCEF) IVPB 1 g/50 mL premix        1 g 100 mL/hr over 30 Minutes Intravenous Every 6 hours 04/16/22 1848 04/17/22 1759   04/16/22 1830  valACYclovir (VALTREX) tablet 1,000 mg        1,000 mg Oral Daily 04/16/22 1828     04/16/22 1830  ceFAZolin (ANCEF) IVPB 1 g/50 mL premix  Status:  Discontinued        1 g 100 mL/hr over 30 Minutes Intravenous Every 6 hours 04/16/22 1828 04/16/22 1847    04/16/22 1200  ceFAZolin (ANCEF) IVPB 2g/100 mL premix        2 g 200 mL/hr over 30 Minutes Intravenous On call to O.R. 04/16/22 1149 04/16/22 1642        Patient was given sequential compression devices, early ambulation, and chemoprophylaxis to prevent DVT.  Patient benefited maximally from hospital stay and there were no complications.    Recent vital signs: Patient Vitals for the past 24 hrs:  BP Temp Temp src Pulse Resp SpO2 Height Weight  04/17/22 0804 108/64 97.8 F (36.6 C) -- 63 17 95 % -- --  04/17/22 0417 (!) 95/54 -- -- 69 18 95 % -- --  04/16/22 2019 120/74 (!) 97.5 F (36.4 C) Oral 79 18 94 % -- --  04/16/22 1842 (!) 162/90 97.6 F (36.4 C) Oral 60 16 99 % -- --  04/16/22 1815 (!) 147/83 98.2 F (36.8 C) -- (!) 59 13 94 % -- --  04/16/22 1756 (!) 164/48 -- -- (!) 54 14 96 % -- --  04/16/22 1741 (!) 165/91 -- -- 65 13 94 % -- --  04/16/22 1726 (!) 142/82 98.2 F (36.8 C) -- 60 13 100 % -- --  04/16/22 1316 (!) 153/79 98.3 F (36.8 C) Oral 70 18 97 % 5\' 1"  (1.549 m) 49 kg     Recent laboratory studies:  Recent Labs    04/16/22 1436  WBC 3.2*  HGB 13.6  HCT 40.5  PLT 123*  NA 135  K 4.1  CL 101  CO2 20*  BUN 12  CREATININE 0.58  GLUCOSE 79  CALCIUM 9.0     Discharge Medications:   Allergies as of 04/17/2022   No Known Allergies      Medication List     TAKE these medications    amphetamine-dextroamphetamine 20 MG 24 hr capsule Commonly known as: Adderall XR Take 1 capsule (20 mg total) by mouth every morning.   aspirin EC 325 MG tablet Take 1 tablet (325 mg total) by mouth daily.   celecoxib 200 MG capsule Commonly known as: CeleBREX Take 1 capsule (200 mg total) by mouth 2 (two) times daily.   cephALEXin 500 MG capsule Commonly known as: KEFLEX Take 1 capsule (500 mg total) by mouth every 6 (six) hours. What changed: when to take this   estradiol 0.1 MG/GM vaginal cream Commonly known as: ESTRACE VAGINAL Apply a fingertip amount  to urethra 2 times per week. What changed:  how much to take when to take this   nystatin 100000 UNIT/ML suspension Commonly known as: MYCOSTATIN Take 5 mLs (500,000 Units total) by mouth 4 (four) times daily.   oxyCODONE 5 MG immediate release tablet Commonly known as: Roxicodone Take 1 tablet by mouth every 4 to 6 hours as needed for breakthrough post op pain What changed: You were already taking a medication with the same name, and this prescription was added. Make sure you understand how and when to take each.   Oxycodone HCl 20 MG Tabs Take 1 tablet (20 mg total) by mouth every 6 (six) hours as needed (pain). Start taking on: May 01, 2022 What changed: Another medication with the same name was added. Make sure you understand how and when to take each.   phenazopyridine 200 MG tablet Commonly known as: PYRIDIUM Take 1 tablet (200 mg total) by mouth 3 (three) times daily as needed.   polyethylene glycol powder 17 GM/SCOOP powder Commonly known as: GLYCOLAX/MIRALAX TAKE 17 GRAMS BY MOUTH 2 TIMES DAILY AS NEEDED. What changed: See the new instructions.   pregabalin 300 MG capsule Commonly known as: LYRICA Take 1 capsule (300 mg total) by mouth 2 (two) times daily.   Restasis 0.05 % ophthalmic emulsion Generic drug: cycloSPORINE Place 1 drop into both eyes 2 times daily. What changed:  how much to take how to take this when to take this   tiZANidine 2 MG tablet Commonly known as: ZANAFLEX Take 1 tablet by mouth every 6 to 8 hours as needed for muscle spasms   valACYclovir 1000 MG tablet Commonly known as: VALTREX Take 1 tablet by mouth once daily. What changed: Another medication with the same name was changed. Make sure you understand how and when to take each.   valACYclovir 1000 MG tablet Commonly known as: Valtrex Take 1 tablet (1,000 mg total) by mouth 3 (three) times daily. What changed: when to take this   verapamil 120 MG CR tablet Commonly known as:  CALAN-SR Take 1 tablet (120 mg total) by mouth at bedtime.               Durable Medical Equipment  (From admission, onward)           Start     Ordered   04/17/22 0908  For home use only DME Crutches  Once       Comments: Single crutch; she is 5'1"   04/17/22 0908  Discharge Care Instructions  (From admission, onward)           Start     Ordered   04/17/22 0000  Non weight bearing       Question Answer Comment  Laterality right   Extremity Lower      04/17/22 1106   04/16/22 0000  Non weight bearing       Question Answer Comment  Laterality right   Extremity Lower      04/16/22 1730            Diagnostic Studies: DG MINI C-ARM IMAGE ONLY  Result Date: 04/16/2022 There is no interpretation for this exam.  This order is for images obtained during a surgical procedure.  Please See "Surgeries" Tab for more information regarding the procedure.   MR LUMBAR SPINE WO CONTRAST  Result Date: 04/09/2022 CLINICAL DATA:  Lumbar pain with right leg weakness. EXAM: MRI LUMBAR SPINE WITHOUT CONTRAST TECHNIQUE: Multiplanar, multisequence MR imaging of the lumbar spine was performed. No intravenous contrast was administered. COMPARISON:  11/26/2010 FINDINGS: Segmentation:  Standard. Alignment:  Mild L5-S1 anterolisthesis Vertebrae: Chronic L5 pars defects. No acute fracture or aggressive bone lesion Conus medullaris and cauda equina: Conus extends to the L2 level. Conus and cauda equina appear normal. Paraspinal and other soft tissues: Negative for perispinal mass or inflammation. Disc levels: L2-L3: Mild disc narrowing and bulging. L3-L4: Disc narrowing and bulging greatest at the foramina. Mild facet spurring L4-L5: Disc narrowing and bulging with small left paracentral herniation not causing nerve root compression. Mild facet spurring L5-S1:Disc narrowing and bulging with right foraminal extrusion. Degenerative facet spurring on both sides. Severe right  foraminal impingement. Left foraminal narrowing is mild. IMPRESSION: L5-S1 severe right foraminal impingement primarily from disc extrusion. Degeneration at L5-S1 is accentuated by chronic L5 pars defects with anterolisthesis. Electronically Signed   By: Jorje Guild M.D.   On: 04/09/2022 07:52    Disposition: Discharge disposition: 01-Home or Self Care      Plan:  -Nonweightbearing right lower extremity with walker, crutches, or knee scooter -Keep splint clean, dry, and intact -Continue baseline oxycodone 20 mg every 6 hours for pain control.  Will add 5 mg oxycodone as needed for breakthrough pain only -Tizanidine for spasms. -Resume baseline aspirin today.   Plan for outpatient follow-up 1 week from surgery with Dr. Scheryl Darter orthopedics for reevaluation of the wound out of the splint.   Discharge Instructions     Call MD / Call 911   Complete by: As directed    If you experience chest pain or shortness of breath, CALL 911 and be transported to the hospital emergency room.  If you develope a fever above 101 F, pus (white drainage) or increased drainage or redness at the wound, or calf pain, call your surgeon's office.   Call MD / Call 911   Complete by: As directed    If you experience chest pain or shortness of breath, CALL 911 and be transported to the hospital emergency room.  If you develope a fever above 101 F, pus (white drainage) or increased drainage or redness at the wound, or calf pain, call your surgeon's office.   Constipation Prevention   Complete by: As directed    Drink plenty of fluids.  Prune juice may be helpful.  You may use a stool softener, such as Colace (over the counter) 100 mg twice a day.  Use MiraLax (over the counter) for constipation as needed.   Constipation Prevention  Complete by: As directed    Drink plenty of fluids.  Prune juice may be helpful.  You may use a stool softener, such as Colace (over the counter) 100 mg twice a day.  Use  MiraLax (over the counter) for constipation as needed.   Diet - low sodium heart healthy   Complete by: As directed    Diet - low sodium heart healthy   Complete by: As directed    Non weight bearing   Complete by: As directed    Laterality: right   Extremity: Lower   Non weight bearing   Complete by: As directed    Laterality: right   Extremity: Lower   Post-operative opioid taper instructions:   Complete by: As directed    POST-OPERATIVE OPIOID TAPER INSTRUCTIONS: It is important to wean off of your opioid medication as soon as possible. If you do not need pain medication after your surgery it is ok to stop day one. Opioids include: Codeine, Hydrocodone(Norco, Vicodin), Oxycodone(Percocet, oxycontin) and hydromorphone amongst others.  Long term and even short term use of opiods can cause: Increased pain response Dependence Constipation Depression Respiratory depression And more.  Withdrawal symptoms can include Flu like symptoms Nausea, vomiting And more Techniques to manage these symptoms Hydrate well Eat regular healthy meals Stay active Use relaxation techniques(deep breathing, meditating, yoga) Do Not substitute Alcohol to help with tapering If you have been on opioids for less than two weeks and do not have pain than it is ok to stop all together.  Plan to wean off of opioids This plan should start within one week post op of your joint replacement. Maintain the same interval or time between taking each dose and first decrease the dose.  Cut the total daily intake of opioids by one tablet each day Next start to increase the time between doses. The last dose that should be eliminated is the evening dose.      Post-operative opioid taper instructions:   Complete by: As directed    POST-OPERATIVE OPIOID TAPER INSTRUCTIONS: It is important to wean off of your opioid medication as soon as possible. If you do not need pain medication after your surgery it is ok to stop  day one. Opioids include: Codeine, Hydrocodone(Norco, Vicodin), Oxycodone(Percocet, oxycontin) and hydromorphone amongst others.  Long term and even short term use of opiods can cause: Increased pain response Dependence Constipation Depression Respiratory depression And more.  Withdrawal symptoms can include Flu like symptoms Nausea, vomiting And more Techniques to manage these symptoms Hydrate well Eat regular healthy meals Stay active Use relaxation techniques(deep breathing, meditating, yoga) Do Not substitute Alcohol to help with tapering If you have been on opioids for less than two weeks and do not have pain than it is ok to stop all together.  Plan to wean off of opioids This plan should start within one week post op of your joint replacement. Maintain the same interval or time between taking each dose and first decrease the dose.  Cut the total daily intake of opioids by one tablet each day Next start to increase the time between doses. The last dose that should be eliminated is the evening dose.           Follow-up Information     Erle Crocker, MD Follow up in 1 week(s).   Specialty: Orthopedic Surgery Contact information: [address] [place] [address] [phone]  Signed: Chalise Pe J [place] 04/17/2022, 11:06 AM

## 2022-04-17 NOTE — Progress Notes (Signed)
Discharge instructions given. Patient verbalized understanding and all questions were answered.  ?

## 2022-04-17 NOTE — Evaluation (Signed)
Occupational Therapy Evaluation Patient Details Name: [name] MRN: [id number] DOB: [birth year] Today's Date: 04/17/2022   History of Present Illness Pt is a 67 y/o female who presents s/p R pilon ankle fracture with associated lateral malleolus fracture, and L clavicle fracture. She is now s/p ORIF of both fractures and is NWB on the RLE and LUE. PMH significant for ADHD, ARDS, Hepatitis, osteoporosis, PNA, R prosthetic eye, Raynaud's disease. ACDF 2016, L TSA 2021.   Clinical Impression   PTA, pt lived with her mother for whom the pt provides intermittent supervision assist at home. Upon eval, pt with good knowledge of NWB precautions and goot insight into current abilities. Pt performing LB ADL with supervision, UB ADL with mod I and tub/shower transfer with min guard A; good maintenance of precautions throughout. Concerns regarding sciatic nerve pain in operative extremity since NWB last surgery. Discussed positioning for sleep, ADL, rest breaks, positioning during mobility to limit overuse; and prioritizing her day. Pt verbalized understanding. Recommending discharge home with no OT follow up at this time.      Recommendations for follow up therapy are one component of a multi-disciplinary discharge planning process, led by the attending physician.  Recommendations may be updated based on patient status, additional functional criteria and insurance authorization.   Follow Up Recommendations  No OT follow up     Assistance Recommended at Discharge PRN  Patient can return home with the following Help with stairs or ramp for entrance;Assist for transportation;A little help with bathing/dressing/bathroom    Functional Status Assessment  Patient has had a recent decline in their functional status and demonstrates the ability to make significant improvements in function in a reasonable and predictable amount of time.  Equipment Recommendations  Other (comment) Geophysicist/field seismologist)     Recommendations for Other Services       Precautions / Restrictions Precautions Precautions: Fall Restrictions Weight Bearing Restrictions: Yes RLE Weight Bearing: Non weight bearing      Mobility Bed Mobility Overal bed mobility: Modified Independent             General bed mobility comments: good maintenance of precautions    Transfers Overall transfer level: Needs assistance Equipment used: Rolling walker (2 wheels) Transfers: Sit to/from Stand Sit to Stand: Supervision           General transfer comment: for safety      Balance Overall balance assessment: Mild deficits observed, not formally tested                                         ADL either performed or assessed with clinical judgement   ADL Overall ADL's : Needs assistance/impaired Eating/Feeding: Independent Eating/Feeding Details (indicate cue type and reason): eating in bed on departure Grooming: Wash/dry hands;Supervision/safety;Standing   Upper Body Bathing: Modified independent;Sitting   Lower Body Bathing: Supervison/ safety;Sit to/from stand   Upper Body Dressing : Modified independent;Sitting   Lower Body Dressing: Supervision/safety;Sit to/from stand Lower Body Dressing Details (indicate cue type and reason): Pt able to verbalize optimal compenatory techniques for pants/underpants and able to don socks Toilet Transfer: Supervision/safety;Stand-pivot;Rolling walker (2 wheels);BSC/3in1 Toilet Transfer Details (indicate cue type and reason): getting on [facility] with no physical assist on arrival. Toileting- Clothing Manipulation and Hygiene: Modified independent;Sitting/lateral lean   Tub/ Shower Transfer: Min guard;Ambulation;Shower seat;Rolling walker (2 wheels);Tub transfer Tub/Shower Transfer Details (indicate cue type and reason): Pt demonstrating  how she performed tub/shower transfers after prior surgery within precautions Functional mobility during ADLs:  Supervision/safety;Rolling walker (2 wheels) General ADL Comments: Supervision for safety; approaching mod I for basic ADL     Vision Baseline Vision/History:  (prosthetic eye R) Ability to See in Adequate Light: 2 Moderately impaired Patient Visual Report: No change from baseline Additional Comments: R prosthetic eye; WFL for tasks assessed     Perception Perception Perception Tested?: No   Praxis Praxis Praxis tested?: Within functional limits    Pertinent Vitals/Pain Pain Assessment Pain Assessment: Faces Faces Pain Scale: Hurts little more Pain Location: R buttock/sciatic nerve area Pain Descriptors / Indicators: Aching, Burning, Discomfort Pain Intervention(s): Limited activity within patient's tolerance, Monitored during session, Patient requesting pain meds-RN notified     Hand Dominance Right   Extremity/Trunk Assessment Upper Extremity Assessment Upper Extremity Assessment: Overall WFL for tasks assessed (Good use of BUE with RW. WFL for tasks assessed, no formal MMT this session)   Lower Extremity Assessment Lower Extremity Assessment: Defer to PT evaluation       Communication Communication Communication: No difficulties   Cognition Arousal/Alertness: Awake/alert Behavior During Therapy: WFL for tasks assessed/performed Overall Cognitive Status: Within Functional Limits for tasks assessed                                 General Comments: Good recall of compensatory techniques she utilized in the home setting after prior surgery     General Comments  VSS    Exercises     Shoulder Instructions      Home Living Family/patient expects to be discharged to:: Private residence Living Arrangements: Parent Available Help at Discharge: Family;Available PRN/intermittently (lives with mother who has dementia; neighbor can assist as needed, sister in law intermittently; friend can give her rides.) Type of Home: House Home Access: Ramped  entrance     Home Layout: One level     Bathroom Shower/Tub: Tub/shower unit;Walk-in shower   Bathroom Toilet: Standard     Home Equipment: Shower seat;Cane - single Barista (2 wheels);Rollator (4 wheels);Transport chair;BSC/3in1;Other (comment) (knee scooter; only has one crutch)          Prior Functioning/Environment Prior Level of Function : Needs assist             Mobility Comments: Has been using the knee scooter for the last couple weeks since ankle fx.          OT Problem List: Decreased strength;Decreased activity tolerance;Impaired balance (sitting and/or standing);Decreased knowledge of use of DME or AE;Decreased knowledge of precautions;Pain      OT Treatment/Interventions:      OT Goals(Current goals can be found in the care plan section) Acute Rehab OT Goals Patient Stated Goal: go home and no more surgery OT Goal Formulation: With patient  OT Frequency:      Co-evaluation              AM-PAC OT "6 Clicks" Daily Activity     Outcome Measure Help from another person eating meals?: None Help from another person taking care of personal grooming?: A Little Help from another person toileting, which includes using toliet, bedpan, or urinal?: A Little Help from another person bathing (including washing, rinsing, drying)?: A Little Help from another person to put on and taking off regular upper body clothing?: None Help from another person to put on and taking off regular lower body clothing?: A Little  6 Click Score: 20   End of Session Equipment Utilized During Treatment: Gait belt;Rolling walker (2 wheels) Nurse Communication: Mobility status;Patient requests pain meds  Activity Tolerance: Patient tolerated treatment well Patient left: in bed;with call bell/phone within reach  OT Visit Diagnosis: Unsteadiness on feet (R26.81);Muscle weakness (generalized) (M62.81);Other abnormalities of gait and mobility (R26.89);Pain Pain -  Right/Left: Left Pain - part of body: Hip;Leg;Ankle and joints of foot                Time: [id number] OT Time Calculation (min): 20 min Charges:  OT General Charges $OT Visit: 1 Visit OT Evaluation $OT Eval Low Complexity: 1 Low  Elder Cyphers, OTR/L [facility] Acute Rehabilitation Office: [phone]   Magnus Ivan 04/17/2022, 8:43 AM

## 2022-04-20 DIAGNOSIS — S82301D Unspecified fracture of lower end of right tibia, subsequent encounter for closed fracture with routine healing: Secondary | ICD-10-CM | POA: Diagnosis not present

## 2022-04-20 DIAGNOSIS — F900 Attention-deficit hyperactivity disorder, predominantly inattentive type: Secondary | ICD-10-CM | POA: Diagnosis not present

## 2022-04-20 DIAGNOSIS — F1911 Other psychoactive substance abuse, in remission: Secondary | ICD-10-CM | POA: Diagnosis not present

## 2022-04-20 DIAGNOSIS — I7300 Raynaud's syndrome without gangrene: Secondary | ICD-10-CM | POA: Diagnosis not present

## 2022-04-20 DIAGNOSIS — S89301D Unspecified physeal fracture of lower end of right fibula, subsequent encounter for fracture with routine healing: Secondary | ICD-10-CM | POA: Diagnosis not present

## 2022-04-20 DIAGNOSIS — Z96698 Presence of other orthopedic joint implants: Secondary | ICD-10-CM | POA: Diagnosis not present

## 2022-04-20 DIAGNOSIS — S42022D Displaced fracture of shaft of left clavicle, subsequent encounter for fracture with routine healing: Secondary | ICD-10-CM | POA: Diagnosis not present

## 2022-04-20 DIAGNOSIS — M2550 Pain in unspecified joint: Secondary | ICD-10-CM | POA: Diagnosis not present

## 2022-04-20 DIAGNOSIS — M810 Age-related osteoporosis without current pathological fracture: Secondary | ICD-10-CM | POA: Diagnosis not present

## 2022-04-20 DIAGNOSIS — F32A Depression, unspecified: Secondary | ICD-10-CM | POA: Diagnosis not present

## 2022-04-21 DIAGNOSIS — M5416 Radiculopathy, lumbar region: Secondary | ICD-10-CM | POA: Diagnosis not present

## 2022-04-27 ENCOUNTER — Other Ambulatory Visit: Payer: Self-pay

## 2022-04-27 ENCOUNTER — Other Ambulatory Visit: Payer: Self-pay | Admitting: Family Medicine

## 2022-04-27 MED ORDER — VERAPAMIL HCL ER 120 MG PO TBCR
120.0000 mg | EXTENDED_RELEASE_TABLET | Freq: Every day | ORAL | 0 refills | Status: DC
  Filled 2022-04-27 – 2022-11-03 (×2): qty 90, 90d supply, fill #0

## 2022-04-27 NOTE — Telephone Encounter (Signed)
Pt LOV was 02/23/22 Last refill for Adderall was done on 03/26/22 Please advise

## 2022-04-28 ENCOUNTER — Other Ambulatory Visit: Payer: Self-pay

## 2022-04-28 ENCOUNTER — Other Ambulatory Visit (HOSPITAL_COMMUNITY): Payer: Self-pay

## 2022-04-28 MED ORDER — AMPHETAMINE-DEXTROAMPHET ER 20 MG PO CP24
20.0000 mg | ORAL_CAPSULE | ORAL | 0 refills | Status: DC
  Filled 2022-04-28: qty 30, 30d supply, fill #0

## 2022-04-29 ENCOUNTER — Encounter: Payer: Self-pay | Admitting: Family Medicine

## 2022-04-29 ENCOUNTER — Other Ambulatory Visit (HOSPITAL_COMMUNITY): Payer: Self-pay

## 2022-04-29 MED ORDER — SULFAMETHOXAZOLE-TRIMETHOPRIM 800-160 MG PO TABS
ORAL_TABLET | ORAL | 0 refills | Status: DC
  Filled 2022-04-29: qty 14, 7d supply, fill #0

## 2022-04-30 NOTE — Telephone Encounter (Signed)
Set up an OV so we can discuss this  

## 2022-05-04 ENCOUNTER — Other Ambulatory Visit (HOSPITAL_COMMUNITY): Payer: Self-pay

## 2022-05-04 ENCOUNTER — Encounter: Payer: Self-pay | Admitting: Family Medicine

## 2022-05-04 ENCOUNTER — Ambulatory Visit (INDEPENDENT_AMBULATORY_CARE_PROVIDER_SITE_OTHER): Payer: Commercial Managed Care - PPO | Admitting: Family Medicine

## 2022-05-04 VITALS — BP 124/78 | HR 60 | Temp 98.0°F | Wt 112.2 lb

## 2022-05-04 DIAGNOSIS — T8189XD Other complications of procedures, not elsewhere classified, subsequent encounter: Secondary | ICD-10-CM

## 2022-05-04 DIAGNOSIS — D696 Thrombocytopenia, unspecified: Secondary | ICD-10-CM | POA: Diagnosis not present

## 2022-05-04 DIAGNOSIS — D709 Neutropenia, unspecified: Secondary | ICD-10-CM | POA: Diagnosis not present

## 2022-05-04 MED ORDER — SULFAMETHOXAZOLE-TRIMETHOPRIM 800-160 MG PO TABS
1.0000 | ORAL_TABLET | Freq: Two times a day (BID) | ORAL | 0 refills | Status: AC
  Filled 2022-05-04: qty 14, 7d supply, fill #0

## 2022-05-04 NOTE — Progress Notes (Signed)
   Subjective:    Patient ID: [name], female    DOB: 11/27/1955, 67 y.o.   MRN: [id number]  HPI Here to discuss several issues. She fell in December and had 3 fractures in the right ankle. She has been seeing Dr. Nicki Guadalajara for orthopedic care. On 02-03-22 she had an ORIF surgery with placement of hardware. However she continued to have pain in the area, and the wound never completely closed. Then on 04-16-22 she had another surgery to remove the hardware. Unfortunately she is still in a lot of pain, and the surgical wound has not healed at all. During the surgery an artificial skin graft was placed. She wore a boot for awhile, but this rubbed the wound, so this was stopped. She now dresses the wound herself BID and wraps it with an ACE bandage. She is scheduled to meet with Dr. Susa Simmonds and a product representative from the company that makes the graft material. The other issue is some lab abnormalities that Dr. Susa Simmonds referred her to [place] to evaluate. On 04-16-22 her WBC was 3.2, and the platelets were 123 K. The Hgb was normal at 13.6. On review of her past labs, her WBC has dipped down to 3.4 several times in the past few years. Her platelet count has been stable in the 120's for several years.    Review of Systems  Constitutional: Negative.   Respiratory: Negative.    Cardiovascular: Negative.   Skin:  Positive for wound.       Objective:   Physical Exam Constitutional:      Comments: In pain, walks with a limp   Cardiovascular:     Rate and Rhythm: Normal rate and regular rhythm.     Pulses: Normal pulses.     Heart sounds: Normal heart sounds.  Pulmonary:     Effort: Pulmonary effort is normal.     Breath sounds: Normal breath sounds.  Skin:    Comments: Her wound is wrapped today, but she shows me photos of the wound on her cell phone. There is a large open area about 3 cm wide over the ankle   Neurological:     Mental Status: She is alert.           Assessment &  Plan:  She has a non-healing surgical wound on the right ankle. She will meet with Dr. Susa Simmonds and a company rep this afternoon. I suggested she ask whether consulting a plastic surgeon or using HBO treatments would be beneficial. As for the lab results, I think these represent her baseline. We will watch them closely, and will repeat a CBC in 2 months. We spent a total of ( 34  ) minutes reviewing records and discussing these issues.  Gershon Crane, MD

## 2022-05-06 NOTE — Telephone Encounter (Signed)
Sounds good

## 2022-05-07 ENCOUNTER — Ambulatory Visit: Payer: Commercial Managed Care - PPO

## 2022-05-07 ENCOUNTER — Encounter: Payer: Self-pay | Admitting: Plastic Surgery

## 2022-05-07 ENCOUNTER — Ambulatory Visit: Payer: Commercial Managed Care - PPO | Admitting: Plastic Surgery

## 2022-05-07 VITALS — BP 139/69 | HR 93 | Ht 61.5 in | Wt 107.0 lb

## 2022-05-07 DIAGNOSIS — S91001A Unspecified open wound, right ankle, initial encounter: Secondary | ICD-10-CM

## 2022-05-07 DIAGNOSIS — T8131XA Disruption of external operation (surgical) wound, not elsewhere classified, initial encounter: Secondary | ICD-10-CM | POA: Diagnosis not present

## 2022-05-07 DIAGNOSIS — S82891A Other fracture of right lower leg, initial encounter for closed fracture: Secondary | ICD-10-CM | POA: Diagnosis not present

## 2022-05-07 NOTE — H&P (View-Only) (Signed)
   Patient ID: [name], female    DOB: 07/14/1955, 67 y.o.   MRN: [id number]   Chief Complaint  Patient presents with   Consult   Skin Problem    The patient is a 67-year-old female here for evaluation of her right ankle.  The patient had a right intra articular distal tibial fracture with associated fibula and medial malleolus fracture.  Surgery was indicated due to displacement of the fracture.  On February 03, 2022 the patient underwent open reduction internal fixation right medial malleolus fracture. The patient was taken back to surgery on 3/21 for surgical wound dehiscence and removal of hardware. She opened again and is having some trouble healing. She is taking antibiotics and pain medications.  The area is ~ 2.5 x 4 cm with fibrous tissue.  The surrounding skin is mildly swollen and red. It is tender to touch.  Nylon sutures are in the skin.      Review of Systems  Constitutional: Negative.   HENT: Negative.    Eyes: Negative.   Respiratory: Negative.    Cardiovascular:  Positive for leg swelling.  Gastrointestinal: Negative.   Endocrine: Negative.   Genitourinary: Negative.   Musculoskeletal: Negative.     Past Medical History:  Diagnosis Date   ADHD (attention deficit hyperactivity disorder)    ARDS (adult respiratory distress syndrome) 2007   Arthritis    Depression    Gallstones 01/07/2018   Ganglion cyst of dorsum of right wrist    Headache    Hepatitis    2004 non detectable now   MRSA (methicillin resistant Staphylococcus aureus) 2007   Osteoporosis    Pneumonia 2009   Prosthetic eye globe    right eye    Raynaud's disease     Past Surgical History:  Procedure Laterality Date   ABDOMINAL HYSTERECTOMY     ANTERIOR CERVICAL DECOMP/DISCECTOMY FUSION N/A 12/12/2014   Procedure: ANTERIOR CERVICAL DECOMPRESSION/DISCECTOMY FUSION 3 LEVELS;  Surgeon: Mark Dumonski, MD;  Location: [facility];  Service: Orthopedics;  Laterality: N/A;  Anterior cervical  decompression fusion, cerivcal 5-6, cervical 6-7, cervical 7-thoracic 1 with instrumentation and allografrt   APLIGRAFT PLACEMENT Right 04/16/2022   Procedure: APPLICATION OF SKIN SUBSTITUTE GRAFT;  Surgeon: Adair, Christopher R, MD;  Location: [facility];  Service: Orthopedics;  Laterality: Right;   APPENDECTOMY  1974   CATARACT EXTRACTION Left 11/2018   CHOLECYSTECTOMY N/A 01/07/2018   Procedure: LAPAROSCOPIC CHOLECYSTECTOMY WITH INTRAOPERATIVE CHOLANGIOGRAM;  Surgeon: Ingram, Haywood, MD;  Location: [facility];  Service: General;  Laterality: N/A;   COLONOSCOPY  04/14/2013   per Dr. Jacobs, clear, repeat in 10 yrs    CORNEAL TRANSPLANT Right 2007   x4   ENUCLEATION Right 2013   eye infection   GANGLION CYST EXCISION Right 05/25/2016   Procedure: RIGHT WRIST ULNAR GANGLION EXCISION;  Surgeon: David Thompson, MD;  Location: [facility];  Service: Orthopedics;  Laterality: Right;   MINOR HARDWARE REMOVAL Right 04/16/2022   Procedure: REPAIR OF SURGICAL WOUND DEHISCENCE RIGHT ANKLE, REMOVAL OF DEEP ORTHOPEDIC HARDWARE;  Surgeon: Adair, Christopher R, MD;  [facility]: [facility];  Service: Orthopedics;  Laterality: Right;   ORIF CLAVICULAR FRACTURE Left 02/03/2022   Procedure: OPEN REDUCTION INTERNAL FIXATION (ORIF) LEFT CLAVICULAR FRACTURE WITH CORACOLAVICULAR FIXATION;  Surgeon: Chandler, Justin, MD;  Location: [facility];  Service: Orthopedics;  Laterality: Left;  BIOMET ZIP LOOP WITH CLAVICLE PLATES   ORIF FIBULA FRACTURE Right 02/03/2022   Procedure: OPEN TREATMENT OF RIGHT PILON ANKLE   FRACTURE INCLUDING LATERAL MALLEOLUS FRACTURE;  Surgeon: Adair, Christopher R, MD;  [facility]: [facility];  Service: Orthopedics;  Laterality: Right;  LENGTH OF SURGERY: 120 MINUTES   REVERSE SHOULDER ARTHROPLASTY Right 07/10/2021   Procedure: REVERSE SHOULDER ARTHROPLASTY;  Surgeon: Chandler, Justin, MD;  Location: [facility];  Service: Orthopedics;  Laterality: Right;   RHINOPLASTY  1980   ROBOTIC ASSISTED LAPAROSCOPIC  SACROCOLPOPEXY Bilateral 06/13/2020   Procedure: XI ROBOTIC ASSISTED LAPAROSCOPIC SACROCOLPOPEXY AND SUPRACERVICAL HYSTERECTOMY AND BILATERAL  SALPINGO OOPHERECTOMY;  Surgeon: Herrick, Benjamin W, MD;  Location: [facility];  Service: Urology;  Laterality: Bilateral;   SYNDESMOSIS REPAIR Right 02/03/2022   Procedure: POSSIBLE OPEN TREATMENT RIGHT SYNDESMOSIS;  Surgeon: Adair, Christopher R, MD;  Location: [facility];  Service: Orthopedics;  Laterality: Right;   TONSILLECTOMY     removed in 3rd grade   TOTAL SHOULDER ARTHROPLASTY Left 03/13/2019   Procedure: TOTAL SHOULDER ARTHROPLASTY;  Surgeon: Chandler, Justin, MD;  Location: [facility];  Service: Orthopedics;  Laterality: Left;      Current Outpatient Medications:    amphetamine-dextroamphetamine (ADDERALL XR) 20 MG 24 hr capsule, Take 1 capsule (20 mg total) by mouth every morning., Disp: 30 capsule, Rfl: 0   aspirin EC 325 MG tablet, Take 1 tablet (325 mg total) by mouth daily., Disp: 1 tablet, Rfl: 0   celecoxib (CELEBREX) 200 MG capsule, Take 1 capsule (200 mg total) by mouth 2 (two) times daily., Disp: 60 capsule, Rfl: 0   cycloSPORINE (RESTASIS) 0.05 % ophthalmic emulsion, Place 1 drop into both eyes 2 times daily. (Patient taking differently: Place 1 drop into both eyes 2 (two) times daily.), Disp: 60 each, Rfl: 11   estradiol (ESTRACE VAGINAL) 0.1 MG/GM vaginal cream, Apply a fingertip amount to urethra 2 times per week. (Patient taking differently: Place 1 Applicatorful vaginally once a week.), Disp: 42.5 g, Rfl: 3   nystatin (MYCOSTATIN) 100000 UNIT/ML suspension, Take 5 mLs (500,000 Units total) by mouth 4 (four) times daily., Disp: 60 mL, Rfl: 0   oxyCODONE (ROXICODONE) 5 MG immediate release tablet, Take 1 tablet by mouth every 4 to 6 hours as needed for breakthrough post op pain, Disp: 20 tablet, Rfl: 0   Oxycodone HCl 20 MG TABS, Take 1 tablet (20 mg total) by mouth every 6 (six) hours as needed (pain)., Disp: 120 tablet, Rfl:  0   phenazopyridine (PYRIDIUM) 200 MG tablet, Take 1 tablet (200 mg total) by mouth 3 (three) times daily as needed., Disp: 30 tablet, Rfl: 1   polyethylene glycol powder (GLYCOLAX/MIRALAX) powder, TAKE 17 GRAMS BY MOUTH 2 TIMES DAILY AS NEEDED. (Patient taking differently: Take 17 g by mouth 2 (two) times a week.), Disp: 3162 g, Rfl: 1   pregabalin (LYRICA) 300 MG capsule, Take 1 capsule (300 mg total) by mouth 2 (two) times daily., Disp: 60 capsule, Rfl: 5   sulfamethoxazole-trimethoprim (BACTRIM DS) 800-160 MG tablet, Take 1 tablet by mouth 2 (two) times daily for 7 days., Disp: 14 tablet, Rfl: 0   tiZANidine (ZANAFLEX) 2 MG tablet, Take 1 tablet by mouth every 6 to 8 hours as needed for muscle spasms, Disp: 60 tablet, Rfl: 1   valACYclovir (VALTREX) 1000 MG tablet, Take 1 tablet by mouth once daily., Disp: 90 tablet, Rfl: 3   valACYclovir (VALTREX) 1000 MG tablet, Take 1 tablet (1,000 mg total) by mouth 3 (three) times daily. (Patient taking differently: Take 1,000 mg by mouth daily.), Disp: 30 tablet, Rfl: 0   verapamil (CALAN-SR) 120 MG CR tablet,   Take 1 tablet (120 mg total) by mouth at bedtime., Disp: 90 tablet, Rfl: 0   Objective:   Vitals:   05/07/22 0840  BP: 139/69  Pulse: 93  SpO2: 97%    Physical Exam Vitals and nursing note reviewed.  Constitutional:      Appearance: Normal appearance.  HENT:     Head: Normocephalic and atraumatic.  Cardiovascular:     Rate and Rhythm: Normal rate.     Pulses: Normal pulses.  Pulmonary:     Effort: Pulmonary effort is normal.  Abdominal:     Palpations: Abdomen is soft.  Skin:    General: Skin is warm.     Capillary Refill: Capillary refill takes less than 2 seconds.     Coloration: Skin is not jaundiced.     Findings: Bruising present.  Neurological:     Mental Status: She is alert and oriented to person, place, and time.  Psychiatric:        Behavior: Behavior normal.        Thought Content: Thought content normal.      Assessment & Plan:  Ankle wound, right, initial encounter  Recommend the patient clean with Vashe daily.  We will get her scheduled for the OR for debridement with Acell placement or Myriad. The patient is in agreement.  Pictures were obtained of the patient and placed in the chart with the patient's or guardian's permission.  Domingos Riggi S Emmery Seiler, DO 

## 2022-05-07 NOTE — Progress Notes (Signed)
Patient ID: [name], female    DOB: 1955-09-07, 67 y.o.   MRN: [id number]   Chief Complaint  Patient presents with   Consult   Skin Problem    The patient is a 67 year old female here for evaluation of her right ankle.  The patient had a right intra articular distal tibial fracture with associated fibula and medial malleolus fracture.  Surgery was indicated due to displacement of the fracture.  On February 03, 2022 the patient underwent open reduction internal fixation right medial malleolus fracture. The patient was taken back to surgery on 3/21 for surgical wound dehiscence and removal of hardware. She opened again and is having some trouble healing. She is taking antibiotics and pain medications.  The area is ~ 2.5 x 4 cm with fibrous tissue.  The surrounding skin is mildly swollen and red. It is tender to touch.  Nylon sutures are in the skin.      Review of Systems  Constitutional: Negative.   HENT: Negative.    Eyes: Negative.   Respiratory: Negative.    Cardiovascular:  Positive for leg swelling.  Gastrointestinal: Negative.   Endocrine: Negative.   Genitourinary: Negative.   Musculoskeletal: Negative.     Past Medical History:  Diagnosis Date   ADHD (attention deficit hyperactivity disorder)    ARDS (adult respiratory distress syndrome) 2007   Arthritis    Depression    Gallstones 01/07/2018   Ganglion cyst of dorsum of right wrist    Headache    Hepatitis    2004 non detectable now   MRSA (methicillin resistant Staphylococcus aureus) 2007   Osteoporosis    Pneumonia 2009   Prosthetic eye globe    right eye    Raynaud's disease     Past Surgical History:  Procedure Laterality Date   ABDOMINAL HYSTERECTOMY     ANTERIOR CERVICAL DECOMP/DISCECTOMY FUSION N/A 12/12/2014   Procedure: ANTERIOR CERVICAL DECOMPRESSION/DISCECTOMY FUSION 3 LEVELS;  Surgeon: Estill Bamberg, MD;  Location: [facility];  Service: Orthopedics;  Laterality: N/A;  Anterior cervical  decompression fusion, cerivcal 5-6, cervical 6-7, cervical 7-thoracic 1 with instrumentation and allografrt   APLIGRAFT PLACEMENT Right 04/16/2022   Procedure: APPLICATION OF SKIN SUBSTITUTE GRAFT;  Surgeon: Terance Hart, MD;  Location: [facility] OR;  Service: Orthopedics;  Laterality: Right;   APPENDECTOMY  1974   CATARACT EXTRACTION Left 11/2018   CHOLECYSTECTOMY N/A 01/07/2018   Procedure: LAPAROSCOPIC CHOLECYSTECTOMY WITH INTRAOPERATIVE CHOLANGIOGRAM;  Surgeon: Claud Kelp, MD;  Location: [facility] OR;  Service: General;  Laterality: N/A;   COLONOSCOPY  04/14/2013   per Dr. Christella Hartigan, clear, repeat in 10 yrs    CORNEAL TRANSPLANT Right 2007   x4   ENUCLEATION Right 2013   eye infection   GANGLION CYST EXCISION Right 05/25/2016   Procedure: RIGHT WRIST ULNAR GANGLION EXCISION;  Surgeon: Mack Hook, MD;  Location: [facility];  Service: Orthopedics;  Laterality: Right;   MINOR HARDWARE REMOVAL Right 04/16/2022   Procedure: REPAIR OF SURGICAL WOUND DEHISCENCE RIGHT ANKLE, REMOVAL OF DEEP ORTHOPEDIC HARDWARE;  Surgeon: Terance Hart, MD;  Location: [facility] OR;  Service: Orthopedics;  Laterality: Right;   ORIF CLAVICULAR FRACTURE Left 02/03/2022   Procedure: OPEN REDUCTION INTERNAL FIXATION (ORIF) LEFT CLAVICULAR FRACTURE WITH CORACOLAVICULAR FIXATION;  Surgeon: Jones Broom, MD;  Location: [facility];  Service: Orthopedics;  Laterality: Left;  BIOMET ZIP LOOP WITH CLAVICLE PLATES   ORIF FIBULA FRACTURE Right 02/03/2022   Procedure: OPEN TREATMENT OF RIGHT PILON ANKLE  FRACTURE INCLUDING LATERAL MALLEOLUS FRACTURE;  Surgeon: Terance Hart, MD;  Location: [facility] OR;  Service: Orthopedics;  Laterality: Right;  LENGTH OF SURGERY: 120 MINUTES   REVERSE SHOULDER ARTHROPLASTY Right 07/10/2021   Procedure: REVERSE SHOULDER ARTHROPLASTY;  Surgeon: Jones Broom, MD;  Location: [facility];  Service: Orthopedics;  Laterality: Right;   RHINOPLASTY  1980   ROBOTIC ASSISTED LAPAROSCOPIC  SACROCOLPOPEXY Bilateral 06/13/2020   Procedure: XI ROBOTIC ASSISTED LAPAROSCOPIC SACROCOLPOPEXY AND SUPRACERVICAL HYSTERECTOMY AND BILATERAL  SALPINGO OOPHERECTOMY;  Surgeon: Crist Fat, MD;  Location: [facility];  Service: Urology;  Laterality: Bilateral;   SYNDESMOSIS REPAIR Right 02/03/2022   Procedure: POSSIBLE OPEN TREATMENT RIGHT SYNDESMOSIS;  Surgeon: Terance Hart, MD;  Location: [facility];  Service: Orthopedics;  Laterality: Right;   TONSILLECTOMY     removed in 3rd grade   TOTAL SHOULDER ARTHROPLASTY Left 03/13/2019   Procedure: TOTAL SHOULDER ARTHROPLASTY;  Surgeon: Jones Broom, MD;  Location: [facility];  Service: Orthopedics;  Laterality: Left;      Current Outpatient Medications:    amphetamine-dextroamphetamine (ADDERALL XR) 20 MG 24 hr capsule, Take 1 capsule (20 mg total) by mouth every morning., Disp: 30 capsule, Rfl: 0   aspirin EC 325 MG tablet, Take 1 tablet (325 mg total) by mouth daily., Disp: 1 tablet, Rfl: 0   celecoxib (CELEBREX) 200 MG capsule, Take 1 capsule (200 mg total) by mouth 2 (two) times daily., Disp: 60 capsule, Rfl: 0   cycloSPORINE (RESTASIS) 0.05 % ophthalmic emulsion, Place 1 drop into both eyes 2 times daily. (Patient taking differently: Place 1 drop into both eyes 2 (two) times daily.), Disp: 60 each, Rfl: 11   estradiol (ESTRACE VAGINAL) 0.1 MG/GM vaginal cream, Apply a fingertip amount to urethra 2 times per week. (Patient taking differently: Place 1 Applicatorful vaginally once a week.), Disp: 42.5 g, Rfl: 3   nystatin (MYCOSTATIN) 100000 UNIT/ML suspension, Take 5 mLs (500,000 Units total) by mouth 4 (four) times daily., Disp: 60 mL, Rfl: 0   oxyCODONE (ROXICODONE) 5 MG immediate release tablet, Take 1 tablet by mouth every 4 to 6 hours as needed for breakthrough post op pain, Disp: 20 tablet, Rfl: 0   Oxycodone HCl 20 MG TABS, Take 1 tablet (20 mg total) by mouth every 6 (six) hours as needed (pain)., Disp: 120 tablet, Rfl:  0   phenazopyridine (PYRIDIUM) 200 MG tablet, Take 1 tablet (200 mg total) by mouth 3 (three) times daily as needed., Disp: 30 tablet, Rfl: 1   polyethylene glycol powder (GLYCOLAX/MIRALAX) powder, TAKE 17 GRAMS BY MOUTH 2 TIMES DAILY AS NEEDED. (Patient taking differently: Take 17 g by mouth 2 (two) times a week.), Disp: 3162 g, Rfl: 1   pregabalin (LYRICA) 300 MG capsule, Take 1 capsule (300 mg total) by mouth 2 (two) times daily., Disp: 60 capsule, Rfl: 5   sulfamethoxazole-trimethoprim (BACTRIM DS) 800-160 MG tablet, Take 1 tablet by mouth 2 (two) times daily for 7 days., Disp: 14 tablet, Rfl: 0   tiZANidine (ZANAFLEX) 2 MG tablet, Take 1 tablet by mouth every 6 to 8 hours as needed for muscle spasms, Disp: 60 tablet, Rfl: 1   valACYclovir (VALTREX) 1000 MG tablet, Take 1 tablet by mouth once daily., Disp: 90 tablet, Rfl: 3   valACYclovir (VALTREX) 1000 MG tablet, Take 1 tablet (1,000 mg total) by mouth 3 (three) times daily. (Patient taking differently: Take 1,000 mg by mouth daily.), Disp: 30 tablet, Rfl: 0   verapamil (CALAN-SR) 120 MG CR tablet,  Take 1 tablet (120 mg total) by mouth at bedtime., Disp: 90 tablet, Rfl: 0   Objective:   Vitals:   05/07/22 0840  BP: 139/69  Pulse: 93  SpO2: 97%    Physical Exam Vitals and nursing note reviewed.  Constitutional:      Appearance: Normal appearance.  HENT:     Head: Normocephalic and atraumatic.  Cardiovascular:     Rate and Rhythm: Normal rate.     Pulses: Normal pulses.  Pulmonary:     Effort: Pulmonary effort is normal.  Abdominal:     Palpations: Abdomen is soft.  Skin:    General: Skin is warm.     Capillary Refill: Capillary refill takes less than 2 seconds.     Coloration: Skin is not jaundiced.     Findings: Bruising present.  Neurological:     Mental Status: She is alert and oriented to person, place, and time.  Psychiatric:        Behavior: Behavior normal.        Thought Content: Thought content normal.      Assessment & Plan:  Ankle wound, right, initial encounter  Recommend the patient clean with Vashe daily.  We will get her scheduled for the OR for debridement with Acell placement or Myriad. The patient is in agreement.  Pictures were obtained of the patient and placed in the chart with the patient's or guardian's permission.  Alena Billslaire S Raziah Funnell, DO

## 2022-05-08 ENCOUNTER — Other Ambulatory Visit (HOSPITAL_COMMUNITY): Payer: Self-pay

## 2022-05-08 ENCOUNTER — Encounter: Payer: Self-pay | Admitting: Plastic Surgery

## 2022-05-08 ENCOUNTER — Other Ambulatory Visit: Payer: Self-pay

## 2022-05-08 DIAGNOSIS — M5416 Radiculopathy, lumbar region: Secondary | ICD-10-CM | POA: Diagnosis not present

## 2022-05-08 NOTE — Telephone Encounter (Signed)
See msg

## 2022-05-12 DIAGNOSIS — M2550 Pain in unspecified joint: Secondary | ICD-10-CM | POA: Diagnosis not present

## 2022-05-12 DIAGNOSIS — F32A Depression, unspecified: Secondary | ICD-10-CM | POA: Diagnosis not present

## 2022-05-12 DIAGNOSIS — Z96698 Presence of other orthopedic joint implants: Secondary | ICD-10-CM | POA: Diagnosis not present

## 2022-05-12 DIAGNOSIS — S42022D Displaced fracture of shaft of left clavicle, subsequent encounter for fracture with routine healing: Secondary | ICD-10-CM | POA: Diagnosis not present

## 2022-05-12 DIAGNOSIS — S82301D Unspecified fracture of lower end of right tibia, subsequent encounter for closed fracture with routine healing: Secondary | ICD-10-CM | POA: Diagnosis not present

## 2022-05-12 DIAGNOSIS — S89301D Unspecified physeal fracture of lower end of right fibula, subsequent encounter for fracture with routine healing: Secondary | ICD-10-CM | POA: Diagnosis not present

## 2022-05-12 DIAGNOSIS — I7300 Raynaud's syndrome without gangrene: Secondary | ICD-10-CM | POA: Diagnosis not present

## 2022-05-12 DIAGNOSIS — M810 Age-related osteoporosis without current pathological fracture: Secondary | ICD-10-CM | POA: Diagnosis not present

## 2022-05-12 DIAGNOSIS — F900 Attention-deficit hyperactivity disorder, predominantly inattentive type: Secondary | ICD-10-CM | POA: Diagnosis not present

## 2022-05-12 DIAGNOSIS — F1911 Other psychoactive substance abuse, in remission: Secondary | ICD-10-CM | POA: Diagnosis not present

## 2022-05-14 ENCOUNTER — Telehealth: Payer: Self-pay | Admitting: Plastic Surgery

## 2022-05-14 DIAGNOSIS — M5416 Radiculopathy, lumbar region: Secondary | ICD-10-CM | POA: Diagnosis not present

## 2022-05-14 NOTE — Telephone Encounter (Signed)
Pt called asking when she would be scheduled for sx, she has an open wound and is in pain she stated.

## 2022-05-15 ENCOUNTER — Telehealth: Payer: Self-pay | Admitting: *Deleted

## 2022-05-15 NOTE — Telephone Encounter (Signed)
Spoke to the patient to schedule sx and related appts.   Reassured patient that everyone with a hand in her case has responded in a very timely manner and that she was put ahead of many patients to get her scheduled one week after her consult. Assured her that the person helping with our codes responded very timely, that any delay comes from sorting through all the surgery orders we get weekly as we prioritize cases by urgency. Patient stated she appreciated this information.

## 2022-05-18 ENCOUNTER — Ambulatory Visit (INDEPENDENT_AMBULATORY_CARE_PROVIDER_SITE_OTHER): Payer: Commercial Managed Care - PPO | Admitting: Physician Assistant

## 2022-05-18 ENCOUNTER — Telehealth: Payer: Self-pay | Admitting: Physician Assistant

## 2022-05-18 ENCOUNTER — Encounter: Payer: Self-pay | Admitting: Physician Assistant

## 2022-05-18 VITALS — BP 185/84 | HR 74 | Ht 61.5 in | Wt 110.2 lb

## 2022-05-18 DIAGNOSIS — S91001A Unspecified open wound, right ankle, initial encounter: Secondary | ICD-10-CM

## 2022-05-18 NOTE — Telephone Encounter (Signed)
Pt would like a call back to let her know how long of an estimated time will she be there so she can tell the person who is bringing her and who will be  taking her home.

## 2022-05-18 NOTE — H&P (View-Only) (Signed)
   Patient ID: [name], female    DOB: 10/27/1955, 66 y.o.   MRN: [id number]  Chief Complaint  Patient presents with   Pre-op Exam    No diagnosis found.   History of Present Illness: [name] is a 66 y.o.  female  with a history of right intra-articular distal tibial fracture with associated fibula and medial malleolus fracture.   She presents for preoperative evaluation for upcoming procedure, right ankle wound debridement with myriad placement, scheduled for 05/28/2022 with Dr. Dillingham.   The patient has not had problems with anesthesia.   Summary of Previous Visit: The patient was previously seen in the office on 05/07/2022.  She had a history of right intra-articular distal tibial fracture with associated fibula and medial malleolus fracture.  She had surgery on February 03, 2022 and underwent open reduction internal fixation of the right medial malleolus fracture.  The patient was taken back to surgery on 04/16/2022 for surgical wound dehiscence and removal of hardware.  She opened up again and was having difficulty healing.  She had been on antibiotics at the time she was seen in the office.  The area was approximately 2.5 x 4 cm.   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 (Patient taking differently: Place 1 drop into both eyes 2 (two) times  daily.), Disp: 60 each, Rfl: 11   estradiol (ESTRACE VAGINAL) 0.1 MG/GM vaginal cream, Apply a fingertip amount to urethra 2 times per week. (Patient taking differently: Place 1 Applicatorful vaginally once a week.), Disp: 42.5 g, Rfl: 3   nystatin (MYCOSTATIN) 100000 UNIT/ML suspension, Take 5 mLs (500,000 Units total) by mouth 4 (four) times daily., Disp: 60 mL, Rfl: 0   Oxycodone HCl 20 MG TABS, Take 1 tablet (20 mg total) by mouth every 6 (six) hours as needed (pain)., Disp: 120 tablet, Rfl: 0   polyethylene glycol powder (GLYCOLAX/MIRALAX) powder, TAKE 17 GRAMS BY MOUTH 2 TIMES DAILY AS NEEDED. (Patient taking differently: Take 17 g by mouth 2 (two) times a week.), Disp: 3162 g, Rfl: 1   pregabalin (LYRICA) 300 MG capsule, Take 1 capsule (300 mg total) by mouth 2 (two) times daily., Disp: 60 capsule, Rfl: 5   tiZANidine (ZANAFLEX) 2 MG tablet, Take 1 tablet by mouth every 6 to 8 hours as needed for muscle spasms (Patient taking differently: Take 2 mg by mouth as needed for muscle spasms. Take 1 tablet by mouth every 6 to 8 hours as needed for muscle spasms), Disp: 60 tablet, Rfl: 1   valACYclovir (VALTREX) 1000 MG tablet, Take 1 tablet by mouth once daily., Disp: 90 tablet, Rfl: 3   valACYclovir (VALTREX) 1000 MG tablet, Take 1 tablet (1,000 mg total) by mouth 3 (three) times daily. (Patient taking differently: Take 1,000 mg by mouth daily.), Disp: 30 tablet, Rfl: 0   verapamil (  CALAN-SR) 120 MG CR tablet, Take 1 tablet (120 mg total) by mouth at bedtime., Disp: 90 tablet, Rfl: 0  Past Medical Problems: Past Medical History:  Diagnosis Date   ADHD (attention deficit hyperactivity disorder)    ARDS (adult respiratory distress syndrome) 2007   Arthritis    Depression    Gallstones 01/07/2018   Ganglion cyst of dorsum of right wrist    Headache    Hepatitis    2004 non detectable now   MRSA (methicillin resistant Staphylococcus aureus) 2007   Osteoporosis    Pneumonia 2009   Prosthetic  eye globe    right eye    Raynaud's disease     Past Surgical History: Past Surgical History:  Procedure Laterality Date   ABDOMINAL HYSTERECTOMY     ANTERIOR CERVICAL DECOMP/DISCECTOMY FUSION N/A 12/12/2014   Procedure: ANTERIOR CERVICAL DECOMPRESSION/DISCECTOMY FUSION 3 LEVELS;  Surgeon: Mark Dumonski, MD;  [facility]: [facility];  Service: Orthopedics;  Laterality: N/A;  Anterior cervical decompression fusion, cerivcal 5-6, cervical 6-7, cervical 7-thoracic 1 with instrumentation and allografrt   APLIGRAFT PLACEMENT Right 04/16/2022   Procedure: APPLICATION OF SKIN SUBSTITUTE GRAFT;  Surgeon: Adair, Christopher R, MD;  Location: [facility];  Service: Orthopedics;  Laterality: Right;   APPENDECTOMY  1974   CATARACT EXTRACTION Left 11/2018   CHOLECYSTECTOMY N/A 01/07/2018   Procedure: LAPAROSCOPIC CHOLECYSTECTOMY WITH INTRAOPERATIVE CHOLANGIOGRAM;  Surgeon: Ingram, Haywood, MD;  Location: [facility];  Service: General;  Laterality: N/A;   COLONOSCOPY  04/14/2013   per Dr. Jacobs, clear, repeat in 10 yrs    CORNEAL TRANSPLANT Right 2007   x4   ENUCLEATION Right 2013   eye infection   GANGLION CYST EXCISION Right 05/25/2016   Procedure: RIGHT WRIST ULNAR GANGLION EXCISION;  Surgeon: David Thompson, MD;  Location: [facility];  Service: Orthopedics;  Laterality: Right;   MINOR HARDWARE REMOVAL Right 04/16/2022   Procedure: REPAIR OF SURGICAL WOUND DEHISCENCE RIGHT ANKLE, REMOVAL OF DEEP ORTHOPEDIC HARDWARE;  Surgeon: Adair, Christopher R, MD;  [facility];  Service: Orthopedics;  Laterality: Right;   ORIF CLAVICULAR FRACTURE Left 02/03/2022   Procedure: OPEN REDUCTION INTERNAL FIXATION (ORIF) LEFT CLAVICULAR FRACTURE WITH CORACOLAVICULAR FIXATION;  Surgeon: Chandler, Justin, MD;  [facility];  Service: Orthopedics;  Laterality: Left;  BIOMET ZIP LOOP WITH CLAVICLE PLATES   ORIF FIBULA FRACTURE Right 02/03/2022   Procedure: OPEN TREATMENT OF RIGHT PILON ANKLE FRACTURE INCLUDING LATERAL  MALLEOLUS FRACTURE;  Surgeon: Adair, Christopher R, MD;  [facility];  Service: Orthopedics;  Laterality: Right;  LENGTH OF SURGERY: 120 MINUTES   REVERSE SHOULDER ARTHROPLASTY Right 07/10/2021   Procedure: REVERSE SHOULDER ARTHROPLASTY;  Surgeon: Chandler, Justin, MD;  Location: [facility];  Service: Orthopedics;  Laterality: Right;   RHINOPLASTY  1980   ROBOTIC ASSISTED LAPAROSCOPIC SACROCOLPOPEXY Bilateral 06/13/2020   Procedure: XI ROBOTIC ASSISTED LAPAROSCOPIC SACROCOLPOPEXY AND SUPRACERVICAL HYSTERECTOMY AND BILATERAL  SALPINGO OOPHERECTOMY;  Surgeon: Herrick, Benjamin W, MD;  Location: [facility];  Service: Urology;  Laterality: Bilateral;   SYNDESMOSIS REPAIR Right 02/03/2022   Procedure: POSSIBLE OPEN TREATMENT RIGHT SYNDESMOSIS;  Surgeon: Adair, Christopher R, MD;  [facility];  Service: Orthopedics;  Laterality: Right;   TONSILLECTOMY     removed in 3rd grade   TOTAL SHOULDER ARTHROPLASTY Left 03/13/2019   Procedure: TOTAL SHOULDER ARTHROPLASTY;  Surgeon: Chandler, Justin, MD;  Location: [facility];  Service: Orthopedics;  Laterality: Left;    Social History: Social History   Socioeconomic History   Marital   status: Single    Spouse name: Not on file   Number of children: Not on file   Years of education: Not on file   Highest education level: Bachelor's degree (e.g., BA, AB, BS)  Occupational History   Not on file  Tobacco Use   Smoking status: Never   Smokeless tobacco: Never  Vaping Use   Vaping Use: Never used  Substance and Sexual Activity   Alcohol use: Yes    Comment: occ   Drug use: No   Sexual activity: Never    Birth control/protection: Post-menopausal  Other Topics Concern   Not on file  Social History Narrative   Not on file   Social Determinants of Health   Financial Resource Strain: Low Risk  (08/14/2021)   Overall Financial Resource Strain (CARDIA)    Difficulty of Paying Living Expenses: Not hard at all  Food Insecurity: No Food  Insecurity (08/14/2021)   Hunger Vital Sign    Worried About Running Out of Food in the Last Year: Never true    Ran Out of Food in the Last Year: Never true  Transportation Needs: No Transportation Needs (08/14/2021)   PRAPARE - Transportation    Lack of Transportation (Medical): No    Lack of Transportation (Non-Medical): No  Physical Activity: Sufficiently Active (08/14/2021)   Exercise Vital Sign    Days of Exercise per Week: 4 days    Minutes of Exercise per Session: 90 min  Stress: No Stress Concern Present (08/14/2021)   [employer] - Occupational Stress Questionnaire    Feeling of Stress : Not at all  Social Connections: Moderately Isolated (08/14/2021)   Social Connection and Isolation Panel [NHANES]    Frequency of Communication with Friends and Family: More than three times a week    Frequency of Social Gatherings with Friends and Family: Once a week    Attends Religious Services: More than 4 times per year    Active Member of Clubs or Organizations: No    Attends Club or Organization Meetings: Not on file    Marital Status: Divorced  Intimate Partner Violence: Not on file    Family History: Family History  Problem Relation Age of Onset   Breast cancer Maternal Aunt 60   Colon cancer Neg Hx     Review of Systems: ROS Positive for right ankle wound Physical Exam: Vital Signs BP (!) 185/84 (BP Location: Left Arm, Patient Position: Sitting, Cuff Size: Normal) Comment: pt reports she's in pain today  Pulse 74   Ht 5' 1.5" (1.562 m)   Wt 110 lb 3.2 oz (50 kg)   SpO2 100%   BMI 20.48 kg/m   Physical Exam Constitutional:      General: Not in acute distress.    Appearance: Normal appearance. Not ill-appearing.  HENT:     Head: Normocephalic and atraumatic.  Eyes:     Pupils: Pupils are equal, round. Cardiovascular:     Rate and Rhythm: Normal rate. Pulmonary:     Effort: No respiratory distress or increased work of breathing.  Speaks  in full sentences. Musculoskeletal: Normal range of motion. No lower extremity swelling or edema. No varicosities.  Right ankle wound Skin:    General: Skin is warm and dry.     Findings: No erythema or rash.  Neurological:     Mental Status: Alert and oriented to person, place, and time.  Psychiatric:        Mood and Affect: Mood normal.          Behavior: Behavior normal.    Assessment/Plan: The patient is scheduled for right ankle wound debridement with placement of myriad with Dr. Dillingham.  Risks, benefits, and alternatives of procedure discussed, questions answered and consent obtained.    Smoking Status: Non-smoker  Caprini Score: 3; Risk Factors include: Age and length of surgery; Recommendation is early ambulation  Pictures obtained: Previous visit  Post-op Rx sent to pharmacy: None indicated  Patient was provided with the  General Surgical Risk consent document and Pain Medication Agreement prior to their appointment.  They had adequate time to read through the risk consent documents and Pain Medication Agreement. We also discussed them in person together during this preop appointment. All of their questions were answered to their satisfaction.  Recommended calling if they have any further questions.  Risk consent form and Pain Medication Agreement to be scanned into patient's chart.  The patients blood pressure was elevate din clinic today.  She notes that she was in pain causing her blood pressure to be elevated and normally her blood pressure is not elevated.  She has a follow-up appointment with her primary care doctor this week.  She will address the blood pressure at that time I did inform her that if her blood pressure remained elevated that we would not be able to proceed with surgery.     Electronically signed by: Carolie Mcilrath Todd Yashvi Jasinski, PA-C 05/18/2022 10:30 AM  

## 2022-05-18 NOTE — Progress Notes (Signed)
Patient ID: [name], female    DOB: [birth year], 67 y.o.   MRN: [id number]  Chief Complaint  Patient presents with   Pre-op Exam    No diagnosis found.   History of Present Illness: [name] is a 67 y.o.  female  with a history of right intra-articular distal tibial fracture with associated fibula and medial malleolus fracture.   She presents for preoperative evaluation for upcoming procedure, right ankle wound debridement with myriad placement, scheduled for 05/28/2022 with Dr. Ulice Bold.   The patient has not had problems with anesthesia.   Summary of Previous Visit: The patient was previously seen in the office on 05/07/2022.  She had a history of right intra-articular distal tibial fracture with associated fibula and medial malleolus fracture.  She had surgery on February 03, 2022 and underwent open reduction internal fixation of the right medial malleolus fracture.  The patient was taken back to surgery on 04/16/2022 for surgical wound dehiscence and removal of hardware.  She opened up again and was having difficulty healing.  She had been on antibiotics at the time she was seen in the office.  The area was approximately 2.5 x 4 cm.   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 (Patient taking differently: Place 1 drop into both eyes 2 (two) times  daily.), Disp: 60 each, Rfl: 11   estradiol (ESTRACE VAGINAL) 0.1 MG/GM vaginal cream, Apply a fingertip amount to urethra 2 times per week. (Patient taking differently: Place 1 Applicatorful vaginally once a week.), Disp: 42.5 g, Rfl: 3   nystatin (MYCOSTATIN) 100000 UNIT/ML suspension, Take 5 mLs (500,000 Units total) by mouth 4 (four) times daily., Disp: 60 mL, Rfl: 0   Oxycodone HCl 20 MG TABS, Take 1 tablet (20 mg total) by mouth every 6 (six) hours as needed (pain)., Disp: 120 tablet, Rfl: 0   polyethylene glycol powder (GLYCOLAX/MIRALAX) powder, TAKE 17 GRAMS BY MOUTH 2 TIMES DAILY AS NEEDED. (Patient taking differently: Take 17 g by mouth 2 (two) times a week.), Disp: 3162 g, Rfl: 1   pregabalin (LYRICA) 300 MG capsule, Take 1 capsule (300 mg total) by mouth 2 (two) times daily., Disp: 60 capsule, Rfl: 5   tiZANidine (ZANAFLEX) 2 MG tablet, Take 1 tablet by mouth every 6 to 8 hours as needed for muscle spasms (Patient taking differently: Take 2 mg by mouth as needed for muscle spasms. Take 1 tablet by mouth every 6 to 8 hours as needed for muscle spasms), Disp: 60 tablet, Rfl: 1   valACYclovir (VALTREX) 1000 MG tablet, Take 1 tablet by mouth once daily., Disp: 90 tablet, Rfl: 3   valACYclovir (VALTREX) 1000 MG tablet, Take 1 tablet (1,000 mg total) by mouth 3 (three) times daily. (Patient taking differently: Take 1,000 mg by mouth daily.), Disp: 30 tablet, Rfl: 0   verapamil (  CALAN-SR) 120 MG CR tablet, Take 1 tablet (120 mg total) by mouth at bedtime., Disp: 90 tablet, Rfl: 0  Past Medical Problems: Past Medical History:  Diagnosis Date   ADHD (attention deficit hyperactivity disorder)    ARDS (adult respiratory distress syndrome) 2007   Arthritis    Depression    Gallstones 01/07/2018   Ganglion cyst of dorsum of right wrist    Headache    Hepatitis    2004 non detectable now   MRSA (methicillin resistant Staphylococcus aureus) 2007   Osteoporosis    Pneumonia 2009   Prosthetic  eye globe    right eye    Raynaud's disease     Past Surgical History: Past Surgical History:  Procedure Laterality Date   ABDOMINAL HYSTERECTOMY     ANTERIOR CERVICAL DECOMP/DISCECTOMY FUSION N/A 12/12/2014   Procedure: ANTERIOR CERVICAL DECOMPRESSION/DISCECTOMY FUSION 3 LEVELS;  Surgeon: Estill Bamberg, MD;  [facility]: [facility];  Service: Orthopedics;  Laterality: N/A;  Anterior cervical decompression fusion, cerivcal 5-6, cervical 6-7, cervical 7-thoracic 1 with instrumentation and allografrt   APLIGRAFT PLACEMENT Right 04/16/2022   Procedure: APPLICATION OF SKIN SUBSTITUTE GRAFT;  Surgeon: Terance Hart, MD;  [facility];  Service: Orthopedics;  Laterality: Right;   APPENDECTOMY  1974   CATARACT EXTRACTION Left 11/2018   CHOLECYSTECTOMY N/A 01/07/2018   Procedure: LAPAROSCOPIC CHOLECYSTECTOMY WITH INTRAOPERATIVE CHOLANGIOGRAM;  Surgeon: Claud Kelp, MD;  Location: [facility] OR;  Service: General;  Laterality: N/A;   COLONOSCOPY  04/14/2013   per Dr. Christella Hartigan, clear, repeat in 10 yrs    CORNEAL TRANSPLANT Right 2007   x4   ENUCLEATION Right 2013   eye infection   GANGLION CYST EXCISION Right 05/25/2016   Procedure: RIGHT WRIST ULNAR GANGLION EXCISION;  Surgeon: Mack Hook, MD;  Location: [facility];  Service: Orthopedics;  Laterality: Right;   MINOR HARDWARE REMOVAL Right 04/16/2022   Procedure: REPAIR OF SURGICAL WOUND DEHISCENCE RIGHT ANKLE, REMOVAL OF DEEP ORTHOPEDIC HARDWARE;  Surgeon: Terance Hart, MD;  Location: [facility];  Service: Orthopedics;  Laterality: Right;   ORIF CLAVICULAR FRACTURE Left 02/03/2022   Procedure: OPEN REDUCTION INTERNAL FIXATION (ORIF) LEFT CLAVICULAR FRACTURE WITH CORACOLAVICULAR FIXATION;  Surgeon: Jones Broom, MD;  [facility];  Service: Orthopedics;  Laterality: Left;  BIOMET ZIP LOOP WITH CLAVICLE PLATES   ORIF FIBULA FRACTURE Right 02/03/2022   Procedure: OPEN TREATMENT OF RIGHT PILON ANKLE FRACTURE INCLUDING LATERAL  MALLEOLUS FRACTURE;  Surgeon: Terance Hart, MD;  Location: [facility];  Service: Orthopedics;  Laterality: Right;  LENGTH OF SURGERY: 120 MINUTES   REVERSE SHOULDER ARTHROPLASTY Right 07/10/2021   Procedure: REVERSE SHOULDER ARTHROPLASTY;  Surgeon: Jones Broom, MD;  Location: [facility];  Service: Orthopedics;  Laterality: Right;   RHINOPLASTY  1980   ROBOTIC ASSISTED LAPAROSCOPIC SACROCOLPOPEXY Bilateral 06/13/2020   Procedure: XI ROBOTIC ASSISTED LAPAROSCOPIC SACROCOLPOPEXY AND SUPRACERVICAL HYSTERECTOMY AND BILATERAL  SALPINGO OOPHERECTOMY;  Surgeon: Crist Fat, MD;  Location: [facility];  Service: Urology;  Laterality: Bilateral;   SYNDESMOSIS REPAIR Right 02/03/2022   Procedure: POSSIBLE OPEN TREATMENT RIGHT SYNDESMOSIS;  Surgeon: Terance Hart, MD;  Location: [facility] OR;  Service: Orthopedics;  Laterality: Right;   TONSILLECTOMY     removed in 3rd grade   TOTAL SHOULDER ARTHROPLASTY Left 03/13/2019   Procedure: TOTAL SHOULDER ARTHROPLASTY;  Surgeon: Jones Broom, MD;  [facility];  Service: Orthopedics;  Laterality: Left;    Social History: Social History   Socioeconomic History   Marital  status: Single    Spouse name: Not on file   Number of children: Not on file   Years of education: Not on file   Highest education level: Bachelor's degree (e.g., BA, AB, BS)  Occupational History   Not on file  Tobacco Use   Smoking status: Never   Smokeless tobacco: Never  Vaping Use   Vaping Use: Never used  Substance and Sexual Activity   Alcohol use: Yes    Comment: occ   Drug use: No   Sexual activity: Never    Birth control/protection: Post-menopausal  Other Topics Concern   Not on file  Social History Narrative   Not on file   Social Determinants of Health   Financial Resource Strain: Low Risk  (08/14/2021)   Overall Financial Resource Strain (CARDIA)    Difficulty of Paying Living Expenses: Not hard at all  Food Insecurity: No Food  Insecurity (08/14/2021)   Hunger Vital Sign    Worried About Running Out of Food in the Last Year: Never true    Ran Out of Food in the Last Year: Never true  Transportation Needs: No Transportation Needs (08/14/2021)   PRAPARE - Administrator, Civil Service (Medical): No    Lack of Transportation (Non-Medical): No  Physical Activity: Sufficiently Active (08/14/2021)   Exercise Vital Sign    Days of Exercise per Week: 4 days    Minutes of Exercise per Session: 90 min  Stress: No Stress Concern Present (08/14/2021)   [employer] of Occupational Health - Occupational Stress Questionnaire    Feeling of Stress : Not at all  Social Connections: Moderately Isolated (08/14/2021)   Social Connection and Isolation Panel [NHANES]    Frequency of Communication with Friends and Family: More than three times a week    Frequency of Social Gatherings with Friends and Family: Once a week    Attends Religious Services: More than 4 times per year    Active Member of [employer] or Organizations: No    Attends Engineer, structural: Not on file    Marital Status: Divorced  Catering manager Violence: Not on file    Family History: Family History  Problem Relation Age of Onset   Breast cancer Maternal Aunt 60   Colon cancer Neg Hx     Review of Systems: ROS Positive for right ankle wound Physical Exam: Vital Signs BP (!) 185/84 (BP Location: Left Arm, Patient Position: Sitting, Cuff Size: Normal) Comment: pt reports she's in pain today  Pulse 74   Ht 5' 1.5" (1.562 m)   Wt 110 lb 3.2 oz (50 kg)   SpO2 100%   BMI 20.48 kg/m   Physical Exam Constitutional:      General: Not in acute distress.    Appearance: Normal appearance. Not ill-appearing.  HENT:     Head: Normocephalic and atraumatic.  Eyes:     Pupils: Pupils are equal, round. Cardiovascular:     Rate and Rhythm: Normal rate. Pulmonary:     Effort: No respiratory distress or increased work of breathing.  Speaks  in full sentences. Musculoskeletal: Normal range of motion. No lower extremity swelling or edema. No varicosities.  Right ankle wound Skin:    General: Skin is warm and dry.     Findings: No erythema or rash.  Neurological:     Mental Status: Alert and oriented to person, place, and time.  Psychiatric:        Mood and Affect: Mood normal.  Behavior: Behavior normal.    Assessment/Plan: The patient is scheduled for right ankle wound debridement with placement of myriad with Dr. Ulice Bold.  Risks, benefits, and alternatives of procedure discussed, questions answered and consent obtained.    Smoking Status: Non-smoker  Caprini Score: 3; Risk Factors include: Age and length of surgery; Recommendation is early ambulation  Pictures obtained: Previous visit  Post-op Rx sent to pharmacy: None indicated  Patient was provided with the  General Surgical Risk consent document and Pain Medication Agreement prior to their appointment.  They had adequate time to read through the risk consent documents and Pain Medication Agreement. We also discussed them in person together during this preop appointment. All of their questions were answered to their satisfaction.  Recommended calling if they have any further questions.  Risk consent form and Pain Medication Agreement to be scanned into patient's chart.  The patients blood pressure was elevate din clinic today.  She notes that she was in pain causing her blood pressure to be elevated and normally her blood pressure is not elevated.  She has a follow-up appointment with her primary care doctor this week.  She will address the blood pressure at that time I did inform her that if her blood pressure remained elevated that we would not be able to proceed with surgery.     Electronically signed by: Kelle Darting Oaklynn Stierwalt, PA-C 05/18/2022 10:30 AM

## 2022-05-19 NOTE — Telephone Encounter (Signed)
I spoke with her - thanks

## 2022-05-22 ENCOUNTER — Encounter (HOSPITAL_BASED_OUTPATIENT_CLINIC_OR_DEPARTMENT_OTHER): Payer: Self-pay | Admitting: Plastic Surgery

## 2022-05-22 ENCOUNTER — Other Ambulatory Visit: Payer: Self-pay

## 2022-05-27 ENCOUNTER — Ambulatory Visit (INDEPENDENT_AMBULATORY_CARE_PROVIDER_SITE_OTHER): Payer: Commercial Managed Care - PPO | Admitting: Family Medicine

## 2022-05-27 ENCOUNTER — Other Ambulatory Visit (HOSPITAL_COMMUNITY): Payer: Self-pay

## 2022-05-27 ENCOUNTER — Encounter: Payer: Self-pay | Admitting: Family Medicine

## 2022-05-27 VITALS — BP 136/80 | HR 87 | Temp 97.8°F | Wt 108.0 lb

## 2022-05-27 DIAGNOSIS — F1190 Opioid use, unspecified, uncomplicated: Secondary | ICD-10-CM | POA: Diagnosis not present

## 2022-05-27 DIAGNOSIS — F419 Anxiety disorder, unspecified: Secondary | ICD-10-CM | POA: Diagnosis not present

## 2022-05-27 DIAGNOSIS — M159 Polyosteoarthritis, unspecified: Secondary | ICD-10-CM | POA: Diagnosis not present

## 2022-05-27 DIAGNOSIS — R030 Elevated blood-pressure reading, without diagnosis of hypertension: Secondary | ICD-10-CM

## 2022-05-27 MED ORDER — OXYCODONE HCL 20 MG PO TABS: 20.0000 mg | ORAL_TABLET | Freq: Four times a day (QID) | ORAL | 0 refills | Status: DC | PRN

## 2022-05-27 MED ORDER — NYSTATIN 100000 UNIT/ML MT SUSP
5.0000 mL | Freq: Four times a day (QID) | OROMUCOSAL | 2 refills | Status: AC
  Filled 2022-05-27: qty 473, 24d supply, fill #0
  Filled 2022-11-06: qty 473, 24d supply, fill #1
  Filled 2023-02-26: qty 473, 24d supply, fill #2

## 2022-05-27 MED ORDER — DIAZEPAM 5 MG PO TABS
5.0000 mg | ORAL_TABLET | Freq: Three times a day (TID) | ORAL | 0 refills | Status: DC | PRN
  Filled 2022-05-27: qty 60, 20d supply, fill #0

## 2022-05-27 MED ORDER — AMPHETAMINE-DEXTROAMPHET ER 20 MG PO CP24
20.0000 mg | ORAL_CAPSULE | ORAL | 0 refills | Status: DC
  Filled 2022-05-27: qty 30, 30d supply, fill #0

## 2022-05-27 NOTE — Progress Notes (Signed)
   Subjective:    Patient ID: [name], female    DOB: Jan 02, 1956, 67 y.o.   MRN: [id number]  HPI Here for pain management. She is scheduled for a plastic surgical procedure tomorrow per Dr. Alan Ripper Dillingham to close an open surgical wound on the right ankle. She plans to use the Myriad Matrix system as part of a research protocol. Ronald has never had high BP since we have been seeing her, but at her OV with Plastics on 05-18-22 her BP was 185/84. They said they could not do the surgery unless this was lower. As we speak today, she and I agree that she has been quite anxious about the procedure, and the high BP reading that day reflected this anxiety. No headache or chest pain or SOB. Otherwise her OA pains are about the same.    Review of Systems  Constitutional: Negative.   Respiratory: Negative.    Cardiovascular: Negative.   Musculoskeletal:  Positive for arthralgias.  Psychiatric/Behavioral:  Negative for dysphoric mood. The patient is nervous/anxious.        Objective:   Physical Exam Constitutional:      Comments: Walks with a limp   Neurological:     Mental Status: She is alert.  Psychiatric:     Comments: She is quite anxious            Assessment & Plan:  Pain management.  Indication for chronic opioid: OA Medication and dose: Oxycodone 20 mg # pills per month: 120 Last UDS date: 05-27-22 Opioid Treatment Agreement signed (Y/N): 04-30-17 Opioid Treatment Agreement last reviewed with patient:  05-27-22 [facility] reviewed this encounter (include red flags): Yes The pain meds were refilled.  She is also dealing with anxiety over her upcoming surgery, and this has caused her BP to go up. We will treat this with Valium 5 mg to use every 8 hours as needed.  Gershon Crane, MD

## 2022-05-28 ENCOUNTER — Encounter (HOSPITAL_BASED_OUTPATIENT_CLINIC_OR_DEPARTMENT_OTHER): Payer: Self-pay | Admitting: Plastic Surgery

## 2022-05-28 ENCOUNTER — Encounter (HOSPITAL_BASED_OUTPATIENT_CLINIC_OR_DEPARTMENT_OTHER)
Admission: RE | Disposition: A | Discharge status: Home / Self Care | Payer: Self-pay | Source: Home / Self Care | Attending: Plastic Surgery

## 2022-05-28 ENCOUNTER — Ambulatory Visit (HOSPITAL_BASED_OUTPATIENT_CLINIC_OR_DEPARTMENT_OTHER)
Admission: RE | Admit: 2022-05-28 | Discharge: 2022-05-28 | Disposition: A | Discharge status: Home / Self Care | Payer: Commercial Managed Care - PPO | Attending: Plastic Surgery | Admitting: Plastic Surgery

## 2022-05-28 ENCOUNTER — Other Ambulatory Visit: Payer: Self-pay

## 2022-05-28 ENCOUNTER — Ambulatory Visit (HOSPITAL_BASED_OUTPATIENT_CLINIC_OR_DEPARTMENT_OTHER): Payer: Commercial Managed Care - PPO | Admitting: Anesthesiology

## 2022-05-28 DIAGNOSIS — J189 Pneumonia, unspecified organism: Secondary | ICD-10-CM | POA: Diagnosis not present

## 2022-05-28 DIAGNOSIS — G709 Myoneural disorder, unspecified: Secondary | ICD-10-CM

## 2022-05-28 DIAGNOSIS — M899 Disorder of bone, unspecified: Secondary | ICD-10-CM | POA: Diagnosis not present

## 2022-05-28 DIAGNOSIS — S91001D Unspecified open wound, right ankle, subsequent encounter: Secondary | ICD-10-CM | POA: Diagnosis not present

## 2022-05-28 DIAGNOSIS — S91001A Unspecified open wound, right ankle, initial encounter: Secondary | ICD-10-CM

## 2022-05-28 DIAGNOSIS — X58XXXA Exposure to other specified factors, initial encounter: Secondary | ICD-10-CM | POA: Insufficient documentation

## 2022-05-28 DIAGNOSIS — D649 Anemia, unspecified: Secondary | ICD-10-CM | POA: Diagnosis not present

## 2022-05-28 DIAGNOSIS — S8251XA Displaced fracture of medial malleolus of right tibia, initial encounter for closed fracture: Secondary | ICD-10-CM | POA: Diagnosis not present

## 2022-05-28 DIAGNOSIS — T8131XA Disruption of external operation (surgical) wound, not elsewhere classified, initial encounter: Secondary | ICD-10-CM | POA: Diagnosis present

## 2022-05-28 HISTORY — PX: I & D EXTREMITY: SHX5045

## 2022-05-28 SURGERY — IRRIGATION AND DEBRIDEMENT EXTREMITY
Anesthesia: General | Site: Ankle | Laterality: Right

## 2022-05-28 MED ORDER — ATROPINE SULFATE 0.4 MG/ML IV SOLN
INTRAVENOUS | Status: AC
  Filled 2022-05-28: qty 1

## 2022-05-28 MED ORDER — VANCOMYCIN HCL IN DEXTROSE 1-5 GM/200ML-% IV SOLN: 1000.0000 mg | INTRAVENOUS | Status: DC

## 2022-05-28 MED ORDER — LIDOCAINE HCL (CARDIAC) PF 100 MG/5ML IV SOSY
PREFILLED_SYRINGE | INTRAVENOUS | Status: DC | PRN
  Administered 2022-05-28: 40 mg via INTRAVENOUS

## 2022-05-28 MED ORDER — CEFAZOLIN SODIUM-DEXTROSE 2-4 GM/100ML-% IV SOLN
2.0000 g | INTRAVENOUS | Status: AC
  Administered 2022-05-28: 2 g via INTRAVENOUS

## 2022-05-28 MED ORDER — ACETAMINOPHEN 325 MG PO TABS: 650.0000 mg | ORAL_TABLET | ORAL | Status: DC | PRN

## 2022-05-28 MED ORDER — LACTATED RINGERS IV SOLN: INTRAVENOUS | Status: DC

## 2022-05-28 MED ORDER — ACETAMINOPHEN 325 MG RE SUPP: 650.0000 mg | RECTAL | Status: DC | PRN

## 2022-05-28 MED ORDER — OXYCODONE HCL 5 MG/5ML PO SOLN: 5.0000 mg | Freq: Once | ORAL | Status: DC | PRN

## 2022-05-28 MED ORDER — HYDROMORPHONE HCL 1 MG/ML IJ SOLN: 0.2500 mg | INTRAMUSCULAR | Status: DC | PRN

## 2022-05-28 MED ORDER — MIDAZOLAM HCL 5 MG/5ML IJ SOLN
INTRAMUSCULAR | Status: DC | PRN
  Administered 2022-05-28: 1 mg via INTRAVENOUS

## 2022-05-28 MED ORDER — PHENYLEPHRINE 80 MCG/ML (10ML) SYRINGE FOR IV PUSH (FOR BLOOD PRESSURE SUPPORT)
PREFILLED_SYRINGE | INTRAVENOUS | Status: AC
  Filled 2022-05-28: qty 10

## 2022-05-28 MED ORDER — VANCOMYCIN HCL IN DEXTROSE 1-5 GM/200ML-% IV SOLN
INTRAVENOUS | Status: AC
  Filled 2022-05-28: qty 200

## 2022-05-28 MED ORDER — SODIUM CHLORIDE 0.9% FLUSH: 3.0000 mL | Freq: Two times a day (BID) | INTRAVENOUS | Status: DC

## 2022-05-28 MED ORDER — FENTANYL CITRATE (PF) 100 MCG/2ML IJ SOLN: 25.0000 ug | INTRAMUSCULAR | Status: DC | PRN

## 2022-05-28 MED ORDER — SUCCINYLCHOLINE CHLORIDE 200 MG/10ML IV SOSY
PREFILLED_SYRINGE | INTRAVENOUS | Status: AC
  Filled 2022-05-28: qty 10

## 2022-05-28 MED ORDER — SODIUM CHLORIDE 0.9 % IV SOLN: 250.0000 mL | INTRAVENOUS | Status: DC | PRN

## 2022-05-28 MED ORDER — MEPERIDINE HCL 25 MG/ML IJ SOLN: 6.2500 mg | INTRAMUSCULAR | Status: DC | PRN

## 2022-05-28 MED ORDER — ONDANSETRON HCL 4 MG/2ML IJ SOLN
INTRAMUSCULAR | Status: DC | PRN
  Administered 2022-05-28: 4 mg via INTRAVENOUS

## 2022-05-28 MED ORDER — LIDOCAINE-EPINEPHRINE 1 %-1:100000 IJ SOLN
INTRAMUSCULAR | Status: DC | PRN
  Administered 2022-05-28: 10 mL

## 2022-05-28 MED ORDER — CEFAZOLIN SODIUM-DEXTROSE 2-4 GM/100ML-% IV SOLN
INTRAVENOUS | Status: AC
  Filled 2022-05-28: qty 100

## 2022-05-28 MED ORDER — PROMETHAZINE HCL 25 MG/ML IJ SOLN: 6.2500 mg | INTRAMUSCULAR | Status: DC | PRN

## 2022-05-28 MED ORDER — EPHEDRINE 5 MG/ML INJ
INTRAVENOUS | Status: AC
  Filled 2022-05-28: qty 5

## 2022-05-28 MED ORDER — CHLORHEXIDINE GLUCONATE CLOTH 2 % EX PADS: 6.0000 | MEDICATED_PAD | Freq: Once | CUTANEOUS | Status: DC

## 2022-05-28 MED ORDER — PROPOFOL 10 MG/ML IV BOLUS
INTRAVENOUS | Status: DC | PRN
  Administered 2022-05-28: 200 mg via INTRAVENOUS

## 2022-05-28 MED ORDER — EPHEDRINE SULFATE (PRESSORS) 50 MG/ML IJ SOLN
INTRAMUSCULAR | Status: DC | PRN
  Administered 2022-05-28: 10 mg via INTRAVENOUS
  Administered 2022-05-28: 15 mg via INTRAVENOUS

## 2022-05-28 MED ORDER — SODIUM CHLORIDE 0.9% FLUSH: 3.0000 mL | INTRAVENOUS | Status: DC | PRN

## 2022-05-28 MED ORDER — OXYCODONE HCL 5 MG PO TABS: 5.0000 mg | ORAL_TABLET | ORAL | Status: DC | PRN

## 2022-05-28 MED ORDER — PROPOFOL 10 MG/ML IV BOLUS
INTRAVENOUS | Status: AC
  Filled 2022-05-28: qty 20

## 2022-05-28 MED ORDER — ONDANSETRON HCL 4 MG/2ML IJ SOLN
INTRAMUSCULAR | Status: AC
  Filled 2022-05-28: qty 2

## 2022-05-28 MED ORDER — FENTANYL CITRATE (PF) 100 MCG/2ML IJ SOLN
INTRAMUSCULAR | Status: AC
  Filled 2022-05-28: qty 2

## 2022-05-28 MED ORDER — LIDOCAINE 2% (20 MG/ML) 5 ML SYRINGE
INTRAMUSCULAR | Status: AC
  Filled 2022-05-28: qty 5

## 2022-05-28 MED ORDER — PHENYLEPHRINE HCL (PRESSORS) 10 MG/ML IV SOLN
INTRAVENOUS | Status: DC | PRN
  Administered 2022-05-28: 80 ug via INTRAVENOUS
  Administered 2022-05-28 (×2): 160 ug via INTRAVENOUS

## 2022-05-28 MED ORDER — PROPOFOL 500 MG/50ML IV EMUL
INTRAVENOUS | Status: AC
  Filled 2022-05-28: qty 50

## 2022-05-28 MED ORDER — AMISULPRIDE (ANTIEMETIC) 5 MG/2ML IV SOLN: 10.0000 mg | Freq: Once | INTRAVENOUS | Status: DC | PRN

## 2022-05-28 MED ORDER — DEXAMETHASONE SODIUM PHOSPHATE 4 MG/ML IJ SOLN
INTRAMUSCULAR | Status: DC | PRN
  Administered 2022-05-28: 5 mg via INTRAVENOUS

## 2022-05-28 MED ORDER — OXYCODONE HCL 5 MG PO TABS: 5.0000 mg | ORAL_TABLET | Freq: Once | ORAL | Status: DC | PRN

## 2022-05-28 MED ORDER — DEXAMETHASONE SODIUM PHOSPHATE 10 MG/ML IJ SOLN
INTRAMUSCULAR | Status: AC
  Filled 2022-05-28: qty 1

## 2022-05-28 MED ORDER — MIDAZOLAM HCL 2 MG/2ML IJ SOLN
INTRAMUSCULAR | Status: AC
  Filled 2022-05-28: qty 2

## 2022-05-28 MED ORDER — 0.9 % SODIUM CHLORIDE (POUR BTL) OPTIME
TOPICAL | Status: DC | PRN
  Administered 2022-05-28: 2000 mL

## 2022-05-28 MED ORDER — FENTANYL CITRATE (PF) 100 MCG/2ML IJ SOLN
INTRAMUSCULAR | Status: DC | PRN
  Administered 2022-05-28: 50 ug via INTRAVENOUS

## 2022-05-28 SURGICAL SUPPLY — 70 items
ADH SKN CLS APL DERMABOND .7 (GAUZE/BANDAGES/DRESSINGS)
BAG DECANTER FOR FLEXI CONT (MISCELLANEOUS) IMPLANT
BLADE HEX COATED 2.75 (ELECTRODE) IMPLANT
BLADE SURG 10 STRL SS (BLADE) IMPLANT
BLADE SURG 15 STRL LF DISP TIS (BLADE) ×1 IMPLANT
BLADE SURG 15 STRL SS (BLADE) ×1
BNDG CMPR 5X3 KNIT ELC UNQ LF (GAUZE/BANDAGES/DRESSINGS) ×2
BNDG CMPR 5X4 KNIT ELC UNQ LF (GAUZE/BANDAGES/DRESSINGS)
BNDG CMPR 5X62 HK CLSR LF (GAUZE/BANDAGES/DRESSINGS)
BNDG CMPR 6 X 5 YARDS HK CLSR (GAUZE/BANDAGES/DRESSINGS)
BNDG CMPR 6"X 5 YARDS HK CLSR (GAUZE/BANDAGES/DRESSINGS)
BNDG CMPR 75X21 PLY HI ABS (MISCELLANEOUS)
BNDG ELASTIC 3INX 5YD STR LF (GAUZE/BANDAGES/DRESSINGS) IMPLANT
BNDG ELASTIC 4INX 5YD STR LF (GAUZE/BANDAGES/DRESSINGS) ×1 IMPLANT
BNDG ELASTIC 6INX 5YD STR LF (GAUZE/BANDAGES/DRESSINGS) IMPLANT
BNDG GAUZE DERMACEA FLUFF 4 (GAUZE/BANDAGES/DRESSINGS) IMPLANT
BNDG GZE 12X3 1 PLY HI ABS (GAUZE/BANDAGES/DRESSINGS)
BNDG GZE DERMACEA 4 6PLY (GAUZE/BANDAGES/DRESSINGS) ×1
BNDG STRETCH GAUZE 3IN X12FT (GAUZE/BANDAGES/DRESSINGS) IMPLANT
CANISTER SUCT 1200ML W/VALVE (MISCELLANEOUS) IMPLANT
COVER BACK TABLE 60X90IN (DRAPES) ×1 IMPLANT
DERMABOND ADVANCED .7 DNX12 (GAUZE/BANDAGES/DRESSINGS) IMPLANT
DRAPE INCISE IOBAN 66X45 STRL (DRAPES) IMPLANT
DRAPE U-SHAPE 76X120 STRL (DRAPES) ×1 IMPLANT
DRSG ADAPTIC 3X8 NADH LF (GAUZE/BANDAGES/DRESSINGS) IMPLANT
DRSG CUTIMED SORBACT 7X9 (GAUZE/BANDAGES/DRESSINGS) IMPLANT
DRSG EMULSION OIL 3X3 NADH (GAUZE/BANDAGES/DRESSINGS) IMPLANT
DRSG HYDROCOLLOID 4X4 (GAUZE/BANDAGES/DRESSINGS) ×1 IMPLANT
ELECT REM PT RETURN 9FT ADLT (ELECTROSURGICAL) ×1
ELECTRODE REM PT RTRN 9FT ADLT (ELECTROSURGICAL) ×1 IMPLANT
GAUZE PAD ABD 8X10 STRL (GAUZE/BANDAGES/DRESSINGS) IMPLANT
GAUZE SPONGE 4X4 12PLY STRL (GAUZE/BANDAGES/DRESSINGS) ×1 IMPLANT
GAUZE SPONGE 4X4 12PLY STRL LF (GAUZE/BANDAGES/DRESSINGS) IMPLANT
GAUZE STRETCH 2X75IN STRL (MISCELLANEOUS) IMPLANT
GAUZE XEROFORM 1X8 LF (GAUZE/BANDAGES/DRESSINGS) IMPLANT
GAUZE XEROFORM 5X9 LF (GAUZE/BANDAGES/DRESSINGS) IMPLANT
GLOVE BIO SURGEON STRL SZ 6.5 (GLOVE) ×1 IMPLANT
GLOVE BIO SURGEON STRL SZ8 (GLOVE) IMPLANT
GOWN STRL REUS W/ TWL LRG LVL3 (GOWN DISPOSABLE) ×2 IMPLANT
GOWN STRL REUS W/TWL LRG LVL3 (GOWN DISPOSABLE) ×3
GRAFT MYRIAD 3 LAYER 5X5 (Graft) IMPLANT
MANIFOLD NEPTUNE II (INSTRUMENTS) IMPLANT
NDL HYPO 25X1 1.5 SAFETY (NEEDLE) IMPLANT
NEEDLE HYPO 25X1 1.5 SAFETY (NEEDLE) ×1 IMPLANT
NS IRRIG 1000ML POUR BTL (IV SOLUTION) ×1 IMPLANT
PACK BASIN DAY SURGERY FS (CUSTOM PROCEDURE TRAY) ×1 IMPLANT
PADDING CAST ABS COTTON 3X4 (CAST SUPPLIES) IMPLANT
PADDING CAST ABS COTTON 4X4 ST (CAST SUPPLIES) IMPLANT
PENCIL SMOKE EVACUATOR (MISCELLANEOUS) IMPLANT
POWDER MYRIAD MORCLLS FINE 500 (Miscellaneous) IMPLANT
PWDR MYRIAD MORCELLS FINE 500 (Miscellaneous) ×1 IMPLANT
SHEET MEDIUM DRAPE 40X70 STRL (DRAPES) ×1 IMPLANT
SLEEVE SCD COMPRESS KNEE MED (STOCKING) IMPLANT
SPIKE FLUID TRANSFER (MISCELLANEOUS) IMPLANT
SPLINT PLASTER CAST XFAST 3X15 (CAST SUPPLIES) IMPLANT
SPONGE T-LAP 18X18 ~~LOC~~+RFID (SPONGE) ×1 IMPLANT
STAPLER VISISTAT 35W (STAPLE) IMPLANT
STRIP CLOSURE SKIN 1/2X4 (GAUZE/BANDAGES/DRESSINGS) IMPLANT
SURGILUBE 2OZ TUBE FLIPTOP (MISCELLANEOUS) IMPLANT
SUT SILK 3 0 PS 1 (SUTURE) IMPLANT
SUT SILK 4 0 PS 2 (SUTURE) IMPLANT
SUT VIC AB 5-0 PS2 18 (SUTURE) ×1 IMPLANT
SYR BULB IRRIG 60ML STRL (SYRINGE) ×1 IMPLANT
SYR CONTROL 10ML LL (SYRINGE) IMPLANT
TAPE HYPAFIX 6X30 (GAUZE/BANDAGES/DRESSINGS) IMPLANT
TOWEL GREEN STERILE FF (TOWEL DISPOSABLE) ×1 IMPLANT
TRAY DSU PREP LF (CUSTOM PROCEDURE TRAY) IMPLANT
TUBE CONNECTING 20X1/4 (TUBING) ×1 IMPLANT
UNDERPAD 30X36 HEAVY ABSORB (UNDERPADS AND DIAPERS) ×1 IMPLANT
YANKAUER SUCT BULB TIP NO VENT (SUCTIONS) ×1 IMPLANT

## 2022-05-28 NOTE — Op Note (Signed)
DATE OF OPERATION: 05/28/2022  LOCATION: [facility]  PREOPERATIVE DIAGNOSIS: right ankle wound  POSTOPERATIVE DIAGNOSIS: Same  PROCEDURE: Excision of right ankle wound 2 x 4 x 1 cm skin and soft tissue and 1 cm of bone.  Placement of Myriad powder 500 mg and sheet 5 x 5 cm  SURGEON: Foster Simpson, DO  ASSISTANT: Burna Forts, PA  EBL: 1 cc  CONDITION: Stable  COMPLICATIONS: None  INDICATION: The patient, [name], is a 67 y.o. female born on July 14, 1955, is here for treatment of a right ankle chronic wound with bone exposed.   PROCEDURE DETAILS:  The patient was seen prior to surgery and marked.  The IV antibiotics were given. The patient was taken to the operating room and given a general anesthetic. A standard time out was performed and all information was confirmed by those in the room. SCD was placed on the left leg.   The right leg was prepped and draped.  The leg was irrigated with saline.  The #15 blade and curette were used to excise the 2 x 4 cm wound of nonviable skin and soft tissue and 1 cm of nonviable bone.  Pressure and local was held on the bone for hemostasis.  All of the powder and sheet were applied to the wound.  The sheet was sutured in place with 5-0 Vicryl.  The sorbact was applied and sutured in place with the Vicryl.  KY gel was applied and the leg was wrapped with kerlex and an ace wrap.  The patient was allowed to wake up and taken to recovery room in stable condition at the end of the case. The family was notified at the end of the case.   The advanced practice practitioner (APP) assisted throughout the case.  The APP was essential in retraction and counter traction when needed to make the case progress smoothly.  This retraction and assistance made it possible to see the tissue plans for the procedure.  The assistance was needed for blood control, tissue re-approximation and assisted with closure of the incision site.

## 2022-05-28 NOTE — Discharge Instructions (Addendum)
Post Anesthesia Home Care Instructions  Activity: Get plenty of rest for the remainder of the day. A responsible individual must stay with you for 24 hours following the procedure.  For the next 24 hours, DO NOT: -Drive a car -Advertising copywriter -Drink alcoholic beverages -Take any medication unless instructed by your physician -Make any legal decisions or sign important papers.  Meals: Start with liquid foods such as gelatin or soup. Progress to regular foods as tolerated. Avoid greasy, spicy, heavy foods. If nausea and/or vomiting occur, drink only clear liquids until the nausea and/or vomiting subsides. Call your physician if vomiting continues.  Special Instructions/Symptoms: Your throat may feel dry or sore from the anesthesia or the breathing tube placed in your throat during surgery. If this causes discomfort, gargle with warm salt water. The discomfort should disappear within 24 hours.  If you had a scopolamine patch placed behind your ear for the management of post- operative nausea and/or vomiting:  1. The medication in the patch is effective for 72 hours, after which it should be removed.  Wrap patch in a tissue and discard in the trash. Wash hands thoroughly with soap and water. 2. You may remove the patch earlier than 72 hours if you experience unpleasant side effects which may include dry mouth, dizziness or visual disturbances. 3. Avoid touching the patch. Wash your hands with soap and water after contact with the patch.      Wound Care   Guide to Wound Care  Proper wound care may reduce the risk of infection, improve healing rates, and limit scarring.  This is a general guide to help care for and manage wounds treated with product wound matrix.   Dressing Changes The frequency of dressing changes can vary based on which product was applied, the size of the wound, or the amount of wound drainage. Dressing inspections are recommended, at least weekly.   If you have a  Wound VAC it will be changed in one week after the first time it is applied.  Then it will be changed once or twice a week.   If you don't have a Wound VAC, then place KY gel on the wound daily and cover with gauze.  Dressing Types Primary Dressing:  Non-adherent dressing goes directly over wounds being treated with the powder or sheet.  Secondary Dressing:  Secures the primary dressing in place and provides extra protection, compression, and absorption.  1. Wash Hands - To help decrease the risk of infection, caregivers should wash their hands for a minimum of 20 seconds and may use medical gloves.   2. Remove the Dressings - Avoid removing product from the wound by carefully removing the applicable dressing(s) at the time points recommended above, or as recommended by the treating physician.  Expected Color and Odor:  It is entirely normal for the wound to have an unpleasant odor and to form a caramel-colored gel as the product absorbs into the wound. It is important to leave this gel on the wound site.  3. Clean the Wound - Use clean water or saline to gently rinse around the wound surface and remove any excess discharge that may be present on the wound. Do not wipe off any of the caramel-colored gel on the wound.   What to look out for:  Large or increased amount of drainage   Surrounding skin has worsening redness or hot to touch   Increased pain in or around the wound   Flu-like symptoms, fatigue, decreased appetite,  fever   Hard, crusty wound surface with black or brown coloring  4. Apply New Dressings - Dressings should cover the entire wound and be suitable for maintaining a moist wound environment.  The non-adherent mesh dressing should be left in place.  New dressing should consist of KY Jelly to keep the wound moist and soft gauze secured with a wrap or tape.   Maintain a Hydrated Wound Area It is important to keep the wound area moist throughout the healing process. If the wound  appears to be dry during dressing changes, select a dressing that will hydrate the wound and maintain that ideal moist environment. If you are unsure what to do, ask the treating physician.  Remodeling Process Every patient heals differently, and no two cases are the same. The size and location of the wound, product type and layering configurations, and general patient health all contribute to how quickly a wound will heal.  While many factors can influence the rate at which the product absorbs, the following can be used as a general guide.   THINGS TO DO: Refrain from smoking High protein diet with plenty of vegetables and some fruit  Limit simple processed carbohydrates and sugar Protect the wound from trauma Protect the dressing  powder       Sheet            Sorbact dressing

## 2022-05-28 NOTE — Interval H&P Note (Signed)
History and Physical Interval Note:  05/28/2022 11:29 AM  [name]  has presented today for surgery, with the diagnosis of R ankle wound.  The various methods of treatment have been discussed with the patient and family. After consideration of risks, benefits and other options for treatment, the patient has consented to  Procedure(s): right ankle wound debridement with myriad placement (Right) APPLICATION OF SKIN SUBSTITUTE (Right) as a surgical intervention.  The patient's history has been reviewed, patient examined, no change in status, stable for surgery.  I have reviewed the patient's chart and labs.  Questions were answered to the patient's satisfaction.     Desmond Szabo S Arleene Settle   

## 2022-05-28 NOTE — Interval H&P Note (Signed)
History and Physical Interval Note:  05/28/2022 11:29 AM  [name]  has presented today for surgery, with the diagnosis of R ankle wound.  The various methods of treatment have been discussed with the patient and family. After consideration of risks, benefits and other options for treatment, the patient has consented to  Procedure(s): right ankle wound debridement with myriad placement (Right) APPLICATION OF SKIN SUBSTITUTE (Right) as a surgical intervention.  The patient's history has been reviewed, patient examined, no change in status, stable for surgery.  I have reviewed the patient's chart and labs.  Questions were answered to the patient's satisfaction.     [name] Aleric Froelich

## 2022-05-28 NOTE — Anesthesia Procedure Notes (Signed)
Procedure Name: LMA Insertion Date/Time: 05/28/2022 11:57 AM  Performed by: Ronnette Hila, CRNAPre-anesthesia Checklist: Patient identified, Emergency Drugs available, Suction available and Patient being monitored Patient Re-evaluated:Patient Re-evaluated prior to induction Oxygen Delivery Method: Circle system utilized Preoxygenation: Pre-oxygenation with 100% oxygen Induction Type: IV induction Ventilation: Mask ventilation without difficulty LMA: LMA inserted LMA Size: 3.0 Number of attempts: 1 Airway Equipment and Method: Bite block Placement Confirmation: positive ETCO2 Tube secured with: Tape Dental Injury: Teeth and Oropharynx as per pre-operative assessment

## 2022-05-28 NOTE — Anesthesia Preprocedure Evaluation (Signed)
Anesthesia Evaluation  Patient identified by MRN, date of birth, ID band Patient awake    Reviewed: Allergy & Precautions, NPO status , Patient's Chart, lab work & pertinent test results  History of Anesthesia Complications Negative for: history of anesthetic complications  Airway Mallampati: II  TM Distance: >3 FB Neck ROM: Full    Dental no notable dental hx. (+) Dental Advisory Given   Pulmonary pneumonia, resolved   Pulmonary exam normal        Cardiovascular negative cardio ROS Normal cardiovascular exam     Neuro/Psych  Headaches PSYCHIATRIC DISORDERS  Depression     Neuromuscular disease    GI/Hepatic negative GI ROS,,,(+) Hepatitis -, C  Endo/Other  negative endocrine ROS    Renal/GU negative Renal ROS  negative genitourinary   Musculoskeletal  (+) Arthritis , Osteoarthritis,  Wound dehiscence right ankle Retained hardware right ankle    Abdominal   Peds negative pediatric ROS (+)  Hematology  (+) Blood dyscrasia, anemia   Anesthesia Other Findings   Reproductive/Obstetrics negative OB ROS                             Anesthesia Physical Anesthesia Plan  ASA: 3  Anesthesia Plan: General   Post-op Pain Management:    Induction: Intravenous  PONV Risk Score and Plan: 3 and Ondansetron, Dexamethasone, Midazolam and Treatment may vary due to age or medical condition  Airway Management Planned: LMA  Additional Equipment: None  Intra-op Plan:   Post-operative Plan: Extubation in OR  Informed Consent: I have reviewed the patients History and Physical, chart, labs and discussed the procedure including the risks, benefits and alternatives for the proposed anesthesia with the patient or authorized representative who has indicated his/her understanding and acceptance.     Dental advisory given  Plan Discussed with: Anesthesiologist, CRNA and Surgeon  Anesthesia Plan  Comments:         Anesthesia Quick Evaluation

## 2022-05-28 NOTE — Transfer of Care (Signed)
Immediate Anesthesia Transfer of Care Note  Patient: [name]  Procedure(s) Performed: right ankle wound debridement with myriad placement (Right: Ankle) APPLICATION OF SKIN SUBSTITUTE (Right: Ankle)  Patient Location: PACU  Anesthesia Type:General  Level of Consciousness: drowsy  Airway & Oxygen Therapy: Patient Spontanous Breathing and Patient connected to face mask oxygen  Post-op Assessment: Report given to RN and Post -op Vital signs reviewed and stable  Post vital signs: Reviewed and stable  Last Vitals:  Vitals Value Taken Time  BP 104/55   Temp    Pulse 72 05/28/22 1235  Resp    SpO2 100 % 05/28/22 1235  Vitals shown include unvalidated device data.  Last Pain:  Vitals:   05/28/22 0944  TempSrc: Oral  PainSc: 5       Patients Stated Pain Goal: 5 (05/28/22 0944)  Complications: No notable events documented.

## 2022-05-28 NOTE — Anesthesia Postprocedure Evaluation (Signed)
Anesthesia Post Note  Patient: [name]  Procedure(s) Performed: right ankle wound debridement with myriad placement (Right: Ankle) APPLICATION OF SKIN SUBSTITUTE (Right: Ankle)     Patient location during evaluation: PACU Anesthesia Type: General Level of consciousness: awake and alert Pain management: pain level controlled Vital Signs Assessment: post-procedure vital signs reviewed and stable Respiratory status: spontaneous breathing, nonlabored ventilation and respiratory function stable Cardiovascular status: blood pressure returned to baseline and stable Postop Assessment: no apparent nausea or vomiting Anesthetic complications: no   No notable events documented.  Last Vitals:  Vitals:   05/28/22 1315 05/28/22 1331  BP: (!) 105/51 120/69  Pulse: 68 73  Resp: (!) 9 16  Temp:  (!) 36.3 C  SpO2: 93% 94%    Last Pain:  Vitals:   05/28/22 1331  TempSrc:   PainSc: 0-No pain                 Lowella Curb

## 2022-05-29 ENCOUNTER — Encounter (HOSPITAL_BASED_OUTPATIENT_CLINIC_OR_DEPARTMENT_OTHER): Payer: Self-pay | Admitting: Plastic Surgery

## 2022-05-29 LAB — DRUG MONITOR, PANEL 1, W/CONF, URINE
Amphetamine: 4647 ng/mL — ABNORMAL HIGH (ref <250)
Amphetamines: POSITIVE ng/mL — AB (ref <500)
Barbiturates: NEGATIVE ng/mL (ref <300)
Benzodiazepines: NEGATIVE ng/mL (ref <100)
Cocaine Metabolite: NEGATIVE ng/mL (ref <150)
Codeine: NEGATIVE ng/mL (ref <50)
Creatinine: 61.5 mg/dL (ref >=20.0)
Hydrocodone: NEGATIVE ng/mL (ref <50)
Hydromorphone: NEGATIVE ng/mL (ref <50)
Marijuana Metabolite: NEGATIVE ng/mL (ref <20)
Methadone Metabolite: NEGATIVE ng/mL (ref <100)
Methamphetamine: NEGATIVE ng/mL (ref <250)
Morphine: NEGATIVE ng/mL (ref <50)
Norhydrocodone: NEGATIVE ng/mL (ref <50)
Noroxycodone: 7005 ng/mL — ABNORMAL HIGH (ref <50)
Opiates: NEGATIVE ng/mL (ref <100)
Oxidant: NEGATIVE ug/mL (ref <200)
Oxycodone: >10000 ng/mL — ABNORMAL HIGH (ref <50)
Oxycodone: POSITIVE ng/mL — AB (ref <100)
Oxymorphone: >10000 ng/mL — ABNORMAL HIGH (ref <50)
Phencyclidine: NEGATIVE ng/mL (ref <25)
pH: 6.4 (ref 4.5–9.0)

## 2022-05-29 LAB — DM TEMPLATE

## 2022-06-01 DIAGNOSIS — M25571 Pain in right ankle and joints of right foot: Secondary | ICD-10-CM | POA: Diagnosis not present

## 2022-06-05 ENCOUNTER — Ambulatory Visit (INDEPENDENT_AMBULATORY_CARE_PROVIDER_SITE_OTHER): Payer: Commercial Managed Care - PPO | Admitting: Plastic Surgery

## 2022-06-05 ENCOUNTER — Encounter: Payer: Self-pay | Admitting: Plastic Surgery

## 2022-06-05 VITALS — BP 111/70 | HR 84

## 2022-06-05 DIAGNOSIS — S91001A Unspecified open wound, right ankle, initial encounter: Secondary | ICD-10-CM

## 2022-06-05 NOTE — Progress Notes (Signed)
  The patient is a 67 year old female here for follow-up after undergoing excision of a right ankle wound she has been doing really well with dressing changes at home.  The area is doing well.  The myriad is in place.  The surrounding tissue is a little red but looks better than it did a week ago.  It looks the way it supposed to for this time.  Continue with dressing changes and we will plan to see her back in a week.  Pictures were obtained of the patient and placed in the chart with the patient's or guardian's permission.

## 2022-06-06 DIAGNOSIS — S20222A Contusion of left back wall of thorax, initial encounter: Secondary | ICD-10-CM | POA: Diagnosis not present

## 2022-06-06 DIAGNOSIS — S82431A Displaced oblique fracture of shaft of right fibula, initial encounter for closed fracture: Secondary | ICD-10-CM | POA: Diagnosis not present

## 2022-06-06 DIAGNOSIS — S32810A Multiple fractures of pelvis with stable disruption of pelvic ring, initial encounter for closed fracture: Secondary | ICD-10-CM | POA: Diagnosis not present

## 2022-06-06 DIAGNOSIS — S199XXA Unspecified injury of neck, initial encounter: Secondary | ICD-10-CM | POA: Diagnosis not present

## 2022-06-06 DIAGNOSIS — S32402A Unspecified fracture of left acetabulum, initial encounter for closed fracture: Secondary | ICD-10-CM | POA: Diagnosis not present

## 2022-06-06 DIAGNOSIS — S3210XA Unspecified fracture of sacrum, initial encounter for closed fracture: Secondary | ICD-10-CM | POA: Diagnosis not present

## 2022-06-06 DIAGNOSIS — M50221 Other cervical disc displacement at C4-C5 level: Secondary | ICD-10-CM | POA: Diagnosis not present

## 2022-06-06 DIAGNOSIS — S32512A Fracture of superior rim of left pubis, initial encounter for closed fracture: Secondary | ICD-10-CM | POA: Diagnosis not present

## 2022-06-06 DIAGNOSIS — R58 Hemorrhage, not elsewhere classified: Secondary | ICD-10-CM | POA: Diagnosis not present

## 2022-06-06 DIAGNOSIS — S3282XA Multiple fractures of pelvis without disruption of pelvic ring, initial encounter for closed fracture: Secondary | ICD-10-CM | POA: Diagnosis not present

## 2022-06-06 DIAGNOSIS — S299XXA Unspecified injury of thorax, initial encounter: Secondary | ICD-10-CM | POA: Diagnosis not present

## 2022-06-06 DIAGNOSIS — S0003XA Contusion of scalp, initial encounter: Secondary | ICD-10-CM | POA: Diagnosis not present

## 2022-06-06 DIAGNOSIS — S32511A Fracture of superior rim of right pubis, initial encounter for closed fracture: Secondary | ICD-10-CM | POA: Diagnosis not present

## 2022-06-06 DIAGNOSIS — S32592A Other specified fracture of left pubis, initial encounter for closed fracture: Secondary | ICD-10-CM | POA: Diagnosis not present

## 2022-06-06 DIAGNOSIS — R9082 White matter disease, unspecified: Secondary | ICD-10-CM | POA: Diagnosis not present

## 2022-06-06 DIAGNOSIS — S32591A Other specified fracture of right pubis, initial encounter for closed fracture: Secondary | ICD-10-CM | POA: Diagnosis not present

## 2022-06-06 DIAGNOSIS — G319 Degenerative disease of nervous system, unspecified: Secondary | ICD-10-CM | POA: Diagnosis not present

## 2022-06-06 DIAGNOSIS — I6523 Occlusion and stenosis of bilateral carotid arteries: Secondary | ICD-10-CM | POA: Diagnosis not present

## 2022-06-06 DIAGNOSIS — R936 Abnormal findings on diagnostic imaging of limbs: Secondary | ICD-10-CM | POA: Diagnosis not present

## 2022-06-06 DIAGNOSIS — S82891A Other fracture of right lower leg, initial encounter for closed fracture: Secondary | ICD-10-CM | POA: Diagnosis not present

## 2022-06-06 DIAGNOSIS — S3992XA Unspecified injury of lower back, initial encounter: Secondary | ICD-10-CM | POA: Diagnosis not present

## 2022-06-06 DIAGNOSIS — Z041 Encounter for examination and observation following transport accident: Secondary | ICD-10-CM | POA: Diagnosis not present

## 2022-06-06 DIAGNOSIS — S32502A Unspecified fracture of left pubis, initial encounter for closed fracture: Secondary | ICD-10-CM | POA: Diagnosis not present

## 2022-06-06 DIAGNOSIS — S301XXA Contusion of abdominal wall, initial encounter: Secondary | ICD-10-CM | POA: Diagnosis not present

## 2022-06-06 DIAGNOSIS — S2242XD Multiple fractures of ribs, left side, subsequent encounter for fracture with routine healing: Secondary | ICD-10-CM | POA: Diagnosis not present

## 2022-06-07 DIAGNOSIS — S299XXA Unspecified injury of thorax, initial encounter: Secondary | ICD-10-CM | POA: Diagnosis not present

## 2022-06-07 DIAGNOSIS — S32502A Unspecified fracture of left pubis, initial encounter for closed fracture: Secondary | ICD-10-CM | POA: Diagnosis not present

## 2022-06-07 DIAGNOSIS — D62 Acute posthemorrhagic anemia: Secondary | ICD-10-CM | POA: Diagnosis not present

## 2022-06-07 DIAGNOSIS — S32591A Other specified fracture of right pubis, initial encounter for closed fracture: Secondary | ICD-10-CM | POA: Diagnosis not present

## 2022-06-07 DIAGNOSIS — S32402A Unspecified fracture of left acetabulum, initial encounter for closed fracture: Secondary | ICD-10-CM | POA: Diagnosis not present

## 2022-06-08 ENCOUNTER — Encounter: Payer: Commercial Managed Care - PPO | Admitting: Physician Assistant

## 2022-06-08 ENCOUNTER — Telehealth: Payer: Self-pay

## 2022-06-08 DIAGNOSIS — S299XXA Unspecified injury of thorax, initial encounter: Secondary | ICD-10-CM | POA: Diagnosis not present

## 2022-06-08 DIAGNOSIS — R000 Tachycardia, unspecified: Secondary | ICD-10-CM | POA: Diagnosis not present

## 2022-06-08 DIAGNOSIS — D62 Acute posthemorrhagic anemia: Secondary | ICD-10-CM | POA: Diagnosis not present

## 2022-06-08 DIAGNOSIS — S82431A Displaced oblique fracture of shaft of right fibula, initial encounter for closed fracture: Secondary | ICD-10-CM | POA: Diagnosis not present

## 2022-06-08 DIAGNOSIS — S301XXA Contusion of abdominal wall, initial encounter: Secondary | ICD-10-CM | POA: Diagnosis not present

## 2022-06-08 DIAGNOSIS — S3983XA Other specified injuries of pelvis, initial encounter: Secondary | ICD-10-CM | POA: Diagnosis not present

## 2022-06-08 DIAGNOSIS — S91001A Unspecified open wound, right ankle, initial encounter: Secondary | ICD-10-CM | POA: Diagnosis not present

## 2022-06-08 NOTE — Telephone Encounter (Signed)
Faxed wound supply order to [facility] with demographics, ins card, most recent ov note and op note. Received correspondence on 06/08/22 stating that [facility] has provided service for the patient; no further action is required. Forwarding documents to front desk for batch scanning.

## 2022-06-09 DIAGNOSIS — M79604 Pain in right leg: Secondary | ICD-10-CM | POA: Diagnosis not present

## 2022-06-09 DIAGNOSIS — S32592A Other specified fracture of left pubis, initial encounter for closed fracture: Secondary | ICD-10-CM | POA: Diagnosis not present

## 2022-06-09 DIAGNOSIS — M25551 Pain in right hip: Secondary | ICD-10-CM | POA: Diagnosis not present

## 2022-06-09 DIAGNOSIS — S3983XA Other specified injuries of pelvis, initial encounter: Secondary | ICD-10-CM | POA: Diagnosis not present

## 2022-06-09 DIAGNOSIS — G8929 Other chronic pain: Secondary | ICD-10-CM | POA: Diagnosis not present

## 2022-06-09 DIAGNOSIS — D62 Acute posthemorrhagic anemia: Secondary | ICD-10-CM | POA: Diagnosis not present

## 2022-06-09 DIAGNOSIS — M25552 Pain in left hip: Secondary | ICD-10-CM | POA: Diagnosis not present

## 2022-06-09 DIAGNOSIS — M545 Low back pain, unspecified: Secondary | ICD-10-CM | POA: Diagnosis not present

## 2022-06-09 DIAGNOSIS — Z79891 Long term (current) use of opiate analgesic: Secondary | ICD-10-CM | POA: Diagnosis not present

## 2022-06-09 DIAGNOSIS — R102 Pelvic and perineal pain: Secondary | ICD-10-CM | POA: Diagnosis not present

## 2022-06-09 DIAGNOSIS — M79605 Pain in left leg: Secondary | ICD-10-CM | POA: Diagnosis not present

## 2022-06-09 DIAGNOSIS — N390 Urinary tract infection, site not specified: Secondary | ICD-10-CM | POA: Diagnosis not present

## 2022-06-09 DIAGNOSIS — S301XXA Contusion of abdominal wall, initial encounter: Secondary | ICD-10-CM | POA: Diagnosis not present

## 2022-06-09 DIAGNOSIS — G8911 Acute pain due to trauma: Secondary | ICD-10-CM | POA: Diagnosis not present

## 2022-06-10 ENCOUNTER — Encounter: Payer: Commercial Managed Care - PPO | Admitting: Physician Assistant

## 2022-06-10 DIAGNOSIS — G8911 Acute pain due to trauma: Secondary | ICD-10-CM | POA: Diagnosis not present

## 2022-06-10 DIAGNOSIS — S0003XA Contusion of scalp, initial encounter: Secondary | ICD-10-CM | POA: Diagnosis not present

## 2022-06-10 DIAGNOSIS — M25559 Pain in unspecified hip: Secondary | ICD-10-CM | POA: Diagnosis not present

## 2022-06-10 DIAGNOSIS — S32501A Unspecified fracture of right pubis, initial encounter for closed fracture: Secondary | ICD-10-CM | POA: Diagnosis not present

## 2022-06-10 DIAGNOSIS — S299XXA Unspecified injury of thorax, initial encounter: Secondary | ICD-10-CM | POA: Diagnosis not present

## 2022-06-10 DIAGNOSIS — S32502A Unspecified fracture of left pubis, initial encounter for closed fracture: Secondary | ICD-10-CM | POA: Diagnosis not present

## 2022-06-10 DIAGNOSIS — S82899A Other fracture of unspecified lower leg, initial encounter for closed fracture: Secondary | ICD-10-CM | POA: Diagnosis not present

## 2022-06-10 DIAGNOSIS — S2242XA Multiple fractures of ribs, left side, initial encounter for closed fracture: Secondary | ICD-10-CM | POA: Diagnosis not present

## 2022-06-10 DIAGNOSIS — N390 Urinary tract infection, site not specified: Secondary | ICD-10-CM | POA: Diagnosis not present

## 2022-06-11 DIAGNOSIS — M25551 Pain in right hip: Secondary | ICD-10-CM | POA: Diagnosis not present

## 2022-06-11 DIAGNOSIS — Z79891 Long term (current) use of opiate analgesic: Secondary | ICD-10-CM | POA: Diagnosis not present

## 2022-06-11 DIAGNOSIS — B9629 Other Escherichia coli [E. coli] as the cause of diseases classified elsewhere: Secondary | ICD-10-CM | POA: Diagnosis not present

## 2022-06-11 DIAGNOSIS — M79604 Pain in right leg: Secondary | ICD-10-CM | POA: Diagnosis not present

## 2022-06-11 DIAGNOSIS — R102 Pelvic and perineal pain: Secondary | ICD-10-CM | POA: Diagnosis not present

## 2022-06-11 DIAGNOSIS — S3983XA Other specified injuries of pelvis, initial encounter: Secondary | ICD-10-CM | POA: Diagnosis not present

## 2022-06-11 DIAGNOSIS — M545 Low back pain, unspecified: Secondary | ICD-10-CM | POA: Diagnosis not present

## 2022-06-11 DIAGNOSIS — M79605 Pain in left leg: Secondary | ICD-10-CM | POA: Diagnosis not present

## 2022-06-11 DIAGNOSIS — S32402A Unspecified fracture of left acetabulum, initial encounter for closed fracture: Secondary | ICD-10-CM | POA: Diagnosis not present

## 2022-06-11 DIAGNOSIS — S32502A Unspecified fracture of left pubis, initial encounter for closed fracture: Secondary | ICD-10-CM | POA: Diagnosis not present

## 2022-06-11 DIAGNOSIS — M25552 Pain in left hip: Secondary | ICD-10-CM | POA: Diagnosis not present

## 2022-06-11 DIAGNOSIS — S0003XA Contusion of scalp, initial encounter: Secondary | ICD-10-CM | POA: Diagnosis not present

## 2022-06-11 DIAGNOSIS — G8911 Acute pain due to trauma: Secondary | ICD-10-CM | POA: Diagnosis not present

## 2022-06-11 DIAGNOSIS — N390 Urinary tract infection, site not specified: Secondary | ICD-10-CM | POA: Diagnosis not present

## 2022-06-12 DIAGNOSIS — S2242XA Multiple fractures of ribs, left side, initial encounter for closed fracture: Secondary | ICD-10-CM | POA: Diagnosis not present

## 2022-06-12 DIAGNOSIS — S3983XA Other specified injuries of pelvis, initial encounter: Secondary | ICD-10-CM | POA: Diagnosis not present

## 2022-06-12 DIAGNOSIS — S0003XA Contusion of scalp, initial encounter: Secondary | ICD-10-CM | POA: Diagnosis not present

## 2022-06-12 DIAGNOSIS — S3289XA Fracture of other parts of pelvis, initial encounter for closed fracture: Secondary | ICD-10-CM | POA: Diagnosis not present

## 2022-06-12 DIAGNOSIS — N390 Urinary tract infection, site not specified: Secondary | ICD-10-CM | POA: Diagnosis not present

## 2022-06-13 DIAGNOSIS — R918 Other nonspecific abnormal finding of lung field: Secondary | ICD-10-CM | POA: Diagnosis not present

## 2022-06-13 DIAGNOSIS — Z9981 Dependence on supplemental oxygen: Secondary | ICD-10-CM | POA: Diagnosis not present

## 2022-06-13 DIAGNOSIS — B9629 Other Escherichia coli [E. coli] as the cause of diseases classified elsewhere: Secondary | ICD-10-CM | POA: Diagnosis not present

## 2022-06-13 DIAGNOSIS — R0902 Hypoxemia: Secondary | ICD-10-CM | POA: Diagnosis not present

## 2022-06-13 DIAGNOSIS — S0003XA Contusion of scalp, initial encounter: Secondary | ICD-10-CM | POA: Diagnosis not present

## 2022-06-13 DIAGNOSIS — S3289XA Fracture of other parts of pelvis, initial encounter for closed fracture: Secondary | ICD-10-CM | POA: Diagnosis not present

## 2022-06-13 DIAGNOSIS — N390 Urinary tract infection, site not specified: Secondary | ICD-10-CM | POA: Diagnosis not present

## 2022-06-14 DIAGNOSIS — B9620 Unspecified Escherichia coli [E. coli] as the cause of diseases classified elsewhere: Secondary | ICD-10-CM | POA: Diagnosis not present

## 2022-06-14 DIAGNOSIS — N390 Urinary tract infection, site not specified: Secondary | ICD-10-CM | POA: Diagnosis not present

## 2022-06-14 DIAGNOSIS — S2242XA Multiple fractures of ribs, left side, initial encounter for closed fracture: Secondary | ICD-10-CM | POA: Diagnosis not present

## 2022-06-14 DIAGNOSIS — S0003XA Contusion of scalp, initial encounter: Secondary | ICD-10-CM | POA: Diagnosis not present

## 2022-06-15 DIAGNOSIS — S301XXA Contusion of abdominal wall, initial encounter: Secondary | ICD-10-CM | POA: Diagnosis not present

## 2022-06-15 DIAGNOSIS — N390 Urinary tract infection, site not specified: Secondary | ICD-10-CM | POA: Diagnosis not present

## 2022-06-15 DIAGNOSIS — S2242XA Multiple fractures of ribs, left side, initial encounter for closed fracture: Secondary | ICD-10-CM | POA: Diagnosis not present

## 2022-06-15 DIAGNOSIS — B9620 Unspecified Escherichia coli [E. coli] as the cause of diseases classified elsewhere: Secondary | ICD-10-CM | POA: Diagnosis not present

## 2022-06-16 ENCOUNTER — Telehealth: Payer: Self-pay | Admitting: Family Medicine

## 2022-06-16 ENCOUNTER — Encounter: Payer: Commercial Managed Care - PPO | Admitting: Physician Assistant

## 2022-06-16 DIAGNOSIS — S32402A Unspecified fracture of left acetabulum, initial encounter for closed fracture: Secondary | ICD-10-CM | POA: Diagnosis not present

## 2022-06-16 DIAGNOSIS — S301XXA Contusion of abdominal wall, initial encounter: Secondary | ICD-10-CM | POA: Diagnosis not present

## 2022-06-16 DIAGNOSIS — S32501A Unspecified fracture of right pubis, initial encounter for closed fracture: Secondary | ICD-10-CM | POA: Diagnosis not present

## 2022-06-16 DIAGNOSIS — S0003XA Contusion of scalp, initial encounter: Secondary | ICD-10-CM | POA: Diagnosis not present
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
## 2022-06-16 NOTE — Telephone Encounter (Signed)
error 

## 2022-06-17 ENCOUNTER — Ambulatory Visit: Payer: Commercial Managed Care - PPO | Admitting: Family Medicine

## 2022-06-18 ENCOUNTER — Telehealth: Payer: Self-pay | Admitting: Family Medicine
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
## 2022-06-23 ENCOUNTER — Telehealth: Payer: Self-pay | Admitting: Family Medicine

## 2022-06-23 NOTE — Telephone Encounter (Signed)
Requesting order forNew PT start of care date 06/29/22

## 2022-06-23 NOTE — Telephone Encounter (Signed)
Spoke with [name] advised that VO requested have been approved

## 2022-06-24 NOTE — Telephone Encounter (Signed)
Spoke with Tamela Oddi advised VO have been approved

## 2022-06-30 ENCOUNTER — Inpatient Hospital Stay: Payer: Commercial Managed Care - PPO | Admitting: Family Medicine

## 2022-06-30 DIAGNOSIS — S8001XA Contusion of right knee, initial encounter: Secondary | ICD-10-CM | POA: Diagnosis not present

## 2022-06-30 DIAGNOSIS — M79604 Pain in right leg: Secondary | ICD-10-CM | POA: Diagnosis not present

## 2022-07-01 ENCOUNTER — Telehealth: Payer: Self-pay | Admitting: Family Medicine

## 2022-07-01 MED ORDER — OXYCODONE HCL 20 MG PO TABS: 20.0000 mg | ORAL_TABLET | ORAL | 0 refills | Status: DC | PRN

## 2022-07-01 NOTE — Telephone Encounter (Signed)
Pt is in [facility] [facility] and has an appt on 07-06-2022 and would like Oxycodone HCl 20 MG TABS  [facility] [address] [place], [place] - [address] AT [address] Phone: [phone]  Fax: [phone]

## 2022-07-01 NOTE — Telephone Encounter (Signed)
Pt LOV was on 05/27/22 Last refill was done on 05/27/22 Please advise

## 2022-07-01 NOTE — Telephone Encounter (Signed)
I sent in #30 Oxycodone tablets

## 2022-07-06 ENCOUNTER — Inpatient Hospital Stay: Payer: Commercial Managed Care - PPO | Admitting: Family Medicine

## 2022-07-07 DIAGNOSIS — S82891A Other fracture of right lower leg, initial encounter for closed fracture: Secondary | ICD-10-CM | POA: Diagnosis not present

## 2022-07-09 ENCOUNTER — Ambulatory Visit (INDEPENDENT_AMBULATORY_CARE_PROVIDER_SITE_OTHER): Payer: Commercial Managed Care - PPO | Admitting: Family Medicine

## 2022-07-09 ENCOUNTER — Encounter: Payer: Self-pay | Admitting: Family Medicine

## 2022-07-09 ENCOUNTER — Telehealth: Payer: Self-pay | Admitting: Family Medicine

## 2022-07-09 VITALS — BP 120/80 | HR 118 | Temp 98.3°F | Wt 108.0 lb

## 2022-07-09 DIAGNOSIS — M25561 Pain in right knee: Secondary | ICD-10-CM | POA: Diagnosis not present

## 2022-07-09 DIAGNOSIS — S300XXA Contusion of lower back and pelvis, initial encounter: Secondary | ICD-10-CM

## 2022-07-09 DIAGNOSIS — S7011XA Contusion of right thigh, initial encounter: Secondary | ICD-10-CM

## 2022-07-09 DIAGNOSIS — M79661 Pain in right lower leg: Secondary | ICD-10-CM | POA: Diagnosis not present

## 2022-07-09 DIAGNOSIS — S32810S Multiple fractures of pelvis with stable disruption of pelvic ring, sequela: Secondary | ICD-10-CM

## 2022-07-09 DIAGNOSIS — M160 Bilateral primary osteoarthritis of hip: Secondary | ICD-10-CM | POA: Diagnosis not present

## 2022-07-09 DIAGNOSIS — S82891A Other fracture of right lower leg, initial encounter for closed fracture: Secondary | ICD-10-CM

## 2022-07-09 DIAGNOSIS — S82891G Other fracture of right lower leg, subsequent encounter for closed fracture with delayed healing: Secondary | ICD-10-CM

## 2022-07-09 DIAGNOSIS — S32810A Multiple fractures of pelvis with stable disruption of pelvic ring, initial encounter for closed fracture: Secondary | ICD-10-CM

## 2022-07-09 DIAGNOSIS — S7011XD Contusion of right thigh, subsequent encounter: Secondary | ICD-10-CM

## 2022-07-09 MED ORDER — OXYCODONE HCL 20 MG PO TABS: 20.0000 mg | ORAL_TABLET | ORAL | 0 refills | Status: DC | PRN

## 2022-07-09 MED ORDER — HYDROMORPHONE HCL 8 MG PO TABS: 8.0000 mg | ORAL_TABLET | Freq: Three times a day (TID) | ORAL | 0 refills | Status: DC | PRN

## 2022-07-09 NOTE — Telephone Encounter (Signed)
Wanted to make provider aware that patient has been directed to go to the hospital
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
## 2022-07-15 DIAGNOSIS — M25561 Pain in right knee: Secondary | ICD-10-CM | POA: Diagnosis not present

## 2022-07-20 ENCOUNTER — Ambulatory Visit (INDEPENDENT_AMBULATORY_CARE_PROVIDER_SITE_OTHER): Payer: Commercial Managed Care - PPO | Admitting: Physician Assistant

## 2022-07-20 DIAGNOSIS — S91001A Unspecified open wound, right ankle, initial encounter: Secondary | ICD-10-CM
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
## 2022-07-21 ENCOUNTER — Ambulatory Visit (INDEPENDENT_AMBULATORY_CARE_PROVIDER_SITE_OTHER): Payer: Commercial Managed Care - PPO | Admitting: Plastic Surgery

## 2022-07-21 ENCOUNTER — Ambulatory Visit (INDEPENDENT_AMBULATORY_CARE_PROVIDER_SITE_OTHER): Payer: Commercial Managed Care - PPO | Admitting: Physician Assistant

## 2022-07-21 ENCOUNTER — Encounter (HOSPITAL_COMMUNITY): Payer: Self-pay | Admitting: Plastic Surgery

## 2022-07-21 ENCOUNTER — Encounter: Payer: Self-pay | Admitting: Plastic Surgery

## 2022-07-21 DIAGNOSIS — S82891G Other fracture of right lower leg, subsequent encounter for closed fracture with delayed healing: Secondary | ICD-10-CM

## 2022-07-21 DIAGNOSIS — S31829A Unspecified open wound of left buttock, initial encounter: Secondary | ICD-10-CM

## 2022-07-21 DIAGNOSIS — I7300 Raynaud's syndrome without gangrene: Secondary | ICD-10-CM
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
## 2022-07-21 NOTE — Progress Notes (Addendum)
Chart reviewed with Dr. Richardson Landry, anesthesiologist, including planned surgery, recent hospital visit, and lab results. Patient is not a candidate for surgery at [facility] per Dr. Richardson Landry due to latest hemoglobin of only 8.0 (05/1822) and must be moved to Main. Left voicemail for Herbert Seta, at Dr. Kittie Plater office, to notify of this. Also called and spoke with Dr. Ulice Bold to notfiy of this as well.

## 2022-07-21 NOTE — Progress Notes (Signed)
SDW call  Patient was given pre-op instructions over the phone. Patient verbalized understanding of instructions provided.    PCP - Dr. Gershon Crane Cardiologist - denies Pulmonary: denies   PPM/ICD - denies  Chest x-ray - 07/01/2021 EKG -  na Stress Test - ECHO -  Cardiac Cath -   Sleep Study/sleep apnea/CPAP: denies  Non-diabetic  Blood Thinner Instructions: denies  Aspirin Instructions:denies   ERAS Protcol - No, NPO PRE-SURGERY Ensure or G2-    COVID TEST- n/a    Anesthesia review: Yes, Dr. Krista Blue made aware   Patient denies shortness of breath, fever, cough and chest pain over the phone call  Your procedure is scheduled on Wednesday July 22, 2022  Report to [facility] Main Entrance "A" at  1315 PM then check in with the Admitting office.  Call this number if you have problems the morning of surgery:  [phone]   If you have any questions prior to your surgery date call [phone]: Open Monday-Friday 8am-4pm If you experience any cold or flu symptoms such as cough, fever, chills, shortness of breath, etc. between now and your scheduled surgery, please notify us at the above number    Remember:  Do not eat or drink after midnight the night before your surgery   Take these medicines the morning of surgery with A SIP OF WATER:  Restasis eye drops, lyrica  As needed: Valium, dilaudid, oxycodone, zanaflex  As of today, STOP taking any Aspirin (unless otherwise instructed by your surgeon) Aleve, Naproxen, Ibuprofen, Motrin, Advil, Goody's, BC's, all herbal medications, fish oil, and all vitamins. This includes your Celebrex

## 2022-07-21 NOTE — Progress Notes (Signed)
Dr Krista Blue informed of patient's 07/21/22 HGB is 8.0, hct 23.5 and platelets 178.  No orders given.  Ok for surgery.
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
## 2022-07-22 ENCOUNTER — Other Ambulatory Visit: Payer: Self-pay

## 2022-07-22 ENCOUNTER — Observation Stay (HOSPITAL_COMMUNITY)
Admission: RE | Admit: 2022-07-22 | Discharge: 2022-07-23 | Disposition: A | Discharge status: Home / Self Care | Payer: Commercial Managed Care - PPO | Attending: Plastic Surgery | Admitting: Plastic Surgery

## 2022-07-22 ENCOUNTER — Encounter (HOSPITAL_COMMUNITY): Payer: Self-pay | Admitting: Plastic Surgery

## 2022-07-22 ENCOUNTER — Ambulatory Visit (HOSPITAL_COMMUNITY): Payer: Commercial Managed Care - PPO | Admitting: Anesthesiology

## 2022-07-22 ENCOUNTER — Encounter (HOSPITAL_COMMUNITY)
Admission: RE | Disposition: A | Discharge status: Home / Self Care | Payer: Self-pay | Source: Home / Self Care | Attending: Plastic Surgery

## 2022-07-22 ENCOUNTER — Ambulatory Visit (HOSPITAL_BASED_OUTPATIENT_CLINIC_OR_DEPARTMENT_OTHER): Payer: Commercial Managed Care - PPO | Admitting: Anesthesiology

## 2022-07-22 DIAGNOSIS — Z96611 Presence of right artificial shoulder joint: Secondary | ICD-10-CM | POA: Insufficient documentation

## 2022-07-22 DIAGNOSIS — S91001A Unspecified open wound, right ankle, initial encounter: Principal | ICD-10-CM | POA: Insufficient documentation

## 2022-07-22 DIAGNOSIS — S21202A Unspecified open wound of left back wall of thorax without penetration into thoracic cavity, initial encounter: Secondary | ICD-10-CM

## 2022-07-22 DIAGNOSIS — Z96612 Presence of left artificial shoulder joint: Secondary | ICD-10-CM | POA: Diagnosis not present

## 2022-07-22 DIAGNOSIS — S20221A Contusion of right back wall of thorax, initial encounter: Secondary | ICD-10-CM | POA: Diagnosis not present

## 2022-07-22 DIAGNOSIS — S300XXA Contusion of lower back and pelvis, initial encounter: Secondary | ICD-10-CM | POA: Diagnosis not present

## 2022-07-22 DIAGNOSIS — F418 Other specified anxiety disorders: Secondary | ICD-10-CM | POA: Diagnosis not present

## 2022-07-22 DIAGNOSIS — S3982XA Other specified injuries of lower back, initial encounter: Secondary | ICD-10-CM | POA: Insufficient documentation

## 2022-07-22 DIAGNOSIS — S91002D Unspecified open wound, left ankle, subsequent encounter: Secondary | ICD-10-CM | POA: Diagnosis not present

## 2022-07-22 DIAGNOSIS — I10 Essential (primary) hypertension: Secondary | ICD-10-CM | POA: Diagnosis not present

## 2022-07-22 DIAGNOSIS — S300XXD Contusion of lower back and pelvis, subsequent encounter: Secondary | ICD-10-CM | POA: Diagnosis not present

## 2022-07-22 DIAGNOSIS — T148XXA Other injury of unspecified body region, initial encounter: Secondary | ICD-10-CM | POA: Diagnosis present

## 2022-07-22 HISTORY — PX: HEMATOMA EVACUATION: SHX5118

## 2022-07-22 HISTORY — DX: Anxiety disorder, unspecified: F41.9

## 2022-07-22 HISTORY — PX: APPLICATION OF WOUND VAC: SHX5189

## 2022-07-22 HISTORY — DX: Essential (primary) hypertension: I10

## 2022-07-22 HISTORY — PX: INCISION AND DRAINAGE: SHX5863

## 2022-07-22 HISTORY — DX: Anemia, unspecified: D64.9

## 2022-07-22 LAB — SURGICAL PCR SCREEN
MRSA, PCR: NEGATIVE
Staphylococcus aureus: NEGATIVE

## 2022-07-22 LAB — CBC
HCT: 36.4 % (ref 36.0–46.0)
Hemoglobin: 11 g/dL — ABNORMAL LOW (ref 12.0–15.0)
MCH: 28.8 pg (ref 26.0–34.0)
MCHC: 30.2 g/dL (ref 30.0–36.0)
MCV: 95.3 fL (ref 80.0–100.0)
Platelets: 201 10*3/uL (ref 150–400)
RBC: 3.82 MIL/uL — ABNORMAL LOW (ref 3.87–5.11)
RDW: 16.4 % — ABNORMAL HIGH (ref 11.5–15.5)
WBC: 6 10*3/uL (ref 4.0–10.5)
nRBC: 0 % (ref 0.0–0.2)

## 2022-07-22 LAB — COMPREHENSIVE METABOLIC PANEL
ALT: 16 U/L (ref 0–44)
AST: 32 U/L (ref 15–41)
Albumin: 2.6 g/dL — ABNORMAL LOW (ref 3.5–5.0)
Alkaline Phosphatase: 82 U/L (ref 38–126)
Anion gap: 11 (ref 5–15)
BUN: 5 mg/dL — ABNORMAL LOW (ref 8–23)
CO2: 24 mmol/L (ref 22–32)
Calcium: 8.1 mg/dL — ABNORMAL LOW (ref 8.9–10.3)
Chloride: 97 mmol/L — ABNORMAL LOW (ref 98–111)
Creatinine, Ser: 0.55 mg/dL (ref 0.44–1.00)
GFR, Estimated: >60 mL/min (ref >60)
Glucose, Bld: 98 mg/dL (ref 70–99)
Potassium: 5 mmol/L (ref 3.5–5.1)
Sodium: 132 mmol/L — ABNORMAL LOW (ref 135–145)
Total Bilirubin: 1.2 mg/dL (ref 0.3–1.2)
Total Protein: 7.8 g/dL (ref 6.5–8.1)

## 2022-07-22 LAB — PROTIME-INR
INR: 1 (ref 0.8–1.2)
Prothrombin Time: 13.6 seconds (ref 11.4–15.2)

## 2022-07-22 LAB — HIV ANTIBODY (ROUTINE TESTING W REFLEX): HIV Screen 4th Generation wRfx: NONREACTIVE

## 2022-07-22 LAB — APTT: aPTT: 27 seconds (ref 24–36)

## 2022-07-22 SURGERY — EVACUATION HEMATOMA
Anesthesia: General | Site: Back | Laterality: Right

## 2022-07-22 MED ORDER — MIDAZOLAM HCL 2 MG/2ML IJ SOLN
INTRAMUSCULAR | Status: DC | PRN
  Administered 2022-07-22 (×2): 1 mg via INTRAVENOUS

## 2022-07-22 MED ORDER — CHLORHEXIDINE GLUCONATE 0.12 % MT SOLN: 15.0000 mL | Freq: Once | OROMUCOSAL | Status: AC

## 2022-07-22 MED ORDER — CHLORHEXIDINE GLUCONATE 0.12 % MT SOLN
OROMUCOSAL | Status: AC
  Administered 2022-07-22: 15 mL via OROMUCOSAL
  Filled 2022-07-22: qty 15

## 2022-07-22 MED ORDER — LACTATED RINGERS IV SOLN: INTRAVENOUS | Status: DC

## 2022-07-22 MED ORDER — ONDANSETRON HCL 4 MG/2ML IJ SOLN: 4.0000 mg | Freq: Four times a day (QID) | INTRAMUSCULAR | Status: DC | PRN

## 2022-07-22 MED ORDER — CEFAZOLIN SODIUM-DEXTROSE 1-4 GM/50ML-% IV SOLN
1.0000 g | Freq: Three times a day (TID) | INTRAVENOUS | Status: DC
  Administered 2022-07-22 – 2022-07-23 (×2): 1 g via INTRAVENOUS
  Filled 2022-07-22 (×4): qty 50

## 2022-07-22 MED ORDER — OXYCODONE HCL 5 MG PO TABS
5.0000 mg | ORAL_TABLET | ORAL | Status: DC | PRN
  Administered 2022-07-22: 5 mg via ORAL
  Administered 2022-07-23: 10 mg via ORAL
  Filled 2022-07-22: qty 2
  Filled 2022-07-22: qty 1

## 2022-07-22 MED ORDER — DIPHENHYDRAMINE HCL 50 MG/ML IJ SOLN: 12.5000 mg | Freq: Four times a day (QID) | INTRAMUSCULAR | Status: DC | PRN

## 2022-07-22 MED ORDER — KCL IN DEXTROSE-NACL 20-5-0.45 MEQ/L-%-% IV SOLN
INTRAVENOUS | Status: DC
  Filled 2022-07-22: qty 1000

## 2022-07-22 MED ORDER — ACETAMINOPHEN 325 MG PO TABS
325.0000 mg | ORAL_TABLET | Freq: Four times a day (QID) | ORAL | Status: DC
  Administered 2022-07-22: 325 mg via ORAL
  Filled 2022-07-22 (×2): qty 1

## 2022-07-22 MED ORDER — ACETAMINOPHEN 500 MG PO TABS: 1000.0000 mg | ORAL_TABLET | Freq: Once | ORAL | Status: DC | PRN

## 2022-07-22 MED ORDER — DEXTROSE 5 % IV SOLN
0.5000 g | Freq: Three times a day (TID) | INTRAVENOUS | Status: DC
  Filled 2022-07-22 (×2): qty 5

## 2022-07-22 MED ORDER — BISACODYL 10 MG RE SUPP: 10.0000 mg | Freq: Every day | RECTAL | Status: DC | PRN

## 2022-07-22 MED ORDER — HYDROMORPHONE HCL 1 MG/ML IJ SOLN: 1.0000 mg | INTRAMUSCULAR | Status: DC | PRN

## 2022-07-22 MED ORDER — FENTANYL CITRATE (PF) 100 MCG/2ML IJ SOLN: 25.0000 ug | INTRAMUSCULAR | Status: DC | PRN

## 2022-07-22 MED ORDER — ORAL CARE MOUTH RINSE: 15.0000 mL | Freq: Once | OROMUCOSAL | Status: AC

## 2022-07-22 MED ORDER — IBUPROFEN 400 MG PO TABS
400.0000 mg | ORAL_TABLET | Freq: Four times a day (QID) | ORAL | Status: DC
  Administered 2022-07-22: 400 mg via ORAL
  Filled 2022-07-22 (×3): qty 1

## 2022-07-22 MED ORDER — CEFAZOLIN SODIUM-DEXTROSE 2-4 GM/100ML-% IV SOLN
INTRAVENOUS | Status: AC
  Filled 2022-07-22: qty 100

## 2022-07-22 MED ORDER — ACETAMINOPHEN 10 MG/ML IV SOLN
INTRAVENOUS | Status: DC | PRN
  Administered 2022-07-22: 650 mg via INTRAVENOUS

## 2022-07-22 MED ORDER — SUGAMMADEX SODIUM 200 MG/2ML IV SOLN
INTRAVENOUS | Status: DC | PRN
  Administered 2022-07-22: 100 mg via INTRAVENOUS

## 2022-07-22 MED ORDER — MIDAZOLAM HCL 2 MG/2ML IJ SOLN
INTRAMUSCULAR | Status: AC
  Filled 2022-07-22: qty 2

## 2022-07-22 MED ORDER — OXYCODONE HCL 5 MG PO TABS: 5.0000 mg | ORAL_TABLET | Freq: Once | ORAL | Status: DC | PRN

## 2022-07-22 MED ORDER — PHENYLEPHRINE 80 MCG/ML (10ML) SYRINGE FOR IV PUSH (FOR BLOOD PRESSURE SUPPORT)
PREFILLED_SYRINGE | INTRAVENOUS | Status: AC
  Filled 2022-07-22: qty 30

## 2022-07-22 MED ORDER — POLYETHYLENE GLYCOL 3350 17 G PO PACK: 17.0000 g | PACK | Freq: Every day | ORAL | Status: DC | PRN

## 2022-07-22 MED ORDER — ROCURONIUM BROMIDE 10 MG/ML (PF) SYRINGE
PREFILLED_SYRINGE | INTRAVENOUS | Status: DC | PRN
  Administered 2022-07-22: 40 mg via INTRAVENOUS

## 2022-07-22 MED ORDER — DEXAMETHASONE SODIUM PHOSPHATE 10 MG/ML IJ SOLN
INTRAMUSCULAR | Status: DC | PRN
  Administered 2022-07-22: 4 mg via INTRAVENOUS

## 2022-07-22 MED ORDER — FENTANYL CITRATE (PF) 250 MCG/5ML IJ SOLN
INTRAMUSCULAR | Status: DC | PRN
  Administered 2022-07-22 (×2): 50 ug via INTRAVENOUS

## 2022-07-22 MED ORDER — CEFAZOLIN SODIUM-DEXTROSE 2-4 GM/100ML-% IV SOLN
2.0000 g | INTRAVENOUS | Status: AC
  Administered 2022-07-22: 2 g via INTRAVENOUS

## 2022-07-22 MED ORDER — ONDANSETRON HCL 4 MG/2ML IJ SOLN
INTRAMUSCULAR | Status: AC
  Filled 2022-07-22: qty 6

## 2022-07-22 MED ORDER — ONDANSETRON HCL 4 MG/2ML IJ SOLN
INTRAMUSCULAR | Status: DC | PRN
  Administered 2022-07-22: 4 mg via INTRAVENOUS

## 2022-07-22 MED ORDER — CHLORHEXIDINE GLUCONATE CLOTH 2 % EX PADS: 6.0000 | MEDICATED_PAD | Freq: Once | CUTANEOUS | Status: DC

## 2022-07-22 MED ORDER — ONDANSETRON 4 MG PO TBDP: 4.0000 mg | ORAL_TABLET | Freq: Four times a day (QID) | ORAL | Status: DC | PRN

## 2022-07-22 MED ORDER — SENNA 8.6 MG PO TABS
1.0000 | ORAL_TABLET | Freq: Two times a day (BID) | ORAL | Status: DC
  Administered 2022-07-22 – 2022-07-23 (×2): 8.6 mg via ORAL
  Filled 2022-07-22 (×2): qty 1

## 2022-07-22 MED ORDER — DIPHENHYDRAMINE HCL 12.5 MG/5ML PO ELIX: 12.5000 mg | ORAL_SOLUTION | Freq: Four times a day (QID) | ORAL | Status: DC | PRN

## 2022-07-22 MED ORDER — FENTANYL CITRATE (PF) 250 MCG/5ML IJ SOLN
INTRAMUSCULAR | Status: AC
  Filled 2022-07-22: qty 5

## 2022-07-22 MED ORDER — ACETAMINOPHEN 10 MG/ML IV SOLN: 1000.0000 mg | Freq: Once | INTRAVENOUS | Status: DC | PRN

## 2022-07-22 MED ORDER — ACETAMINOPHEN 10 MG/ML IV SOLN
INTRAVENOUS | Status: AC
  Filled 2022-07-22: qty 100

## 2022-07-22 MED ORDER — OXYCODONE HCL 5 MG/5ML PO SOLN: 5.0000 mg | Freq: Once | ORAL | Status: DC | PRN

## 2022-07-22 MED ORDER — PROPOFOL 10 MG/ML IV BOLUS
INTRAVENOUS | Status: DC | PRN
  Administered 2022-07-22: 80 mg via INTRAVENOUS

## 2022-07-22 MED ORDER — ACETAMINOPHEN 160 MG/5ML PO SOLN: 1000.0000 mg | Freq: Once | ORAL | Status: DC | PRN

## 2022-07-22 MED ORDER — LIDOCAINE 2% (20 MG/ML) 5 ML SYRINGE
INTRAMUSCULAR | Status: DC | PRN
  Administered 2022-07-22: 40 mg via INTRAVENOUS

## 2022-07-22 MED ORDER — DEXAMETHASONE SODIUM PHOSPHATE 10 MG/ML IJ SOLN
INTRAMUSCULAR | Status: AC
  Filled 2022-07-22: qty 2

## 2022-07-22 MED ORDER — PHENYLEPHRINE 80 MCG/ML (10ML) SYRINGE FOR IV PUSH (FOR BLOOD PRESSURE SUPPORT)
PREFILLED_SYRINGE | INTRAVENOUS | Status: DC | PRN
  Administered 2022-07-22 (×2): 160 ug via INTRAVENOUS
  Administered 2022-07-22: 80 ug via INTRAVENOUS

## 2022-07-22 SURGICAL SUPPLY — 38 items
BAG COUNTER SPONGE SURGICOUNT (BAG) ×2 IMPLANT
BAG SPNG CNTER NS LX DISP (BAG)
BNDG CMPR STD VLCR NS LF 5.8X4 (GAUZE/BANDAGES/DRESSINGS) ×4
BNDG ELASTIC 4X5.8 VLCR NS LF (GAUZE/BANDAGES/DRESSINGS) IMPLANT
BNDG GAUZE DERMACEA FLUFF 4 (GAUZE/BANDAGES/DRESSINGS) IMPLANT
BNDG GZE DERMACEA 4 6PLY (GAUZE/BANDAGES/DRESSINGS) ×4
BUR EGG ELITE 4.0 (BURR) IMPLANT
CANISTER SUCT 3000ML PPV (MISCELLANEOUS) IMPLANT
CANISTER WOUND CARE 500ML ATS (WOUND CARE) IMPLANT
CLEANSER WND VASHE 34 (WOUND CARE) IMPLANT
COVER SURGICAL LIGHT HANDLE (MISCELLANEOUS) ×2 IMPLANT
DRAPE INCISE IOBAN 66X45 STRL (DRAPES) IMPLANT
DRAPE ORTHO SPLIT 77X108 STRL (DRAPES) ×4
DRAPE SURG ORHT 6 SPLT 77X108 (DRAPES) IMPLANT
DRESSING MORCELLS FINE 1000 (Tissue) IMPLANT
DRSG CUTIMED SORBACT 7X9 (GAUZE/BANDAGES/DRESSINGS) IMPLANT
DRSG VAC GRANUFOAM SM (GAUZE/BANDAGES/DRESSINGS) IMPLANT
DRSG VERSA FOAM LRG 10X15 (GAUZE/BANDAGES/DRESSINGS) IMPLANT
ELECT REM PT RETURN 9FT ADLT (ELECTROSURGICAL) ×2
ELECTRODE REM PT RTRN 9FT ADLT (ELECTROSURGICAL) ×2 IMPLANT
GAUZE SPONGE 4X4 12PLY STRL (GAUZE/BANDAGES/DRESSINGS) IMPLANT
GLOVE BIO SURGEON STRL SZ 6.5 (GLOVE) ×4 IMPLANT
GLOVE BIO SURGEON STRL SZ7.5 (GLOVE) IMPLANT
GLOVE BIOGEL M STRL SZ7.5 (GLOVE) IMPLANT
GOWN STRL REUS W/ TWL LRG LVL3 (GOWN DISPOSABLE) ×4 IMPLANT
GOWN STRL REUS W/TWL LRG LVL3 (GOWN DISPOSABLE) ×4
GRAFT MYRIAD 3 LAYER 7X10 (Graft) IMPLANT
KIT BASIN OR (CUSTOM PROCEDURE TRAY) ×2 IMPLANT
KIT TURNOVER KIT B (KITS) ×2 IMPLANT
NS IRRIG 1000ML POUR BTL (IV SOLUTION) ×2 IMPLANT
PACK GENERAL/GYN (CUSTOM PROCEDURE TRAY) ×2 IMPLANT
PAD ARMBOARD 7.5X6 YLW CONV (MISCELLANEOUS) ×4 IMPLANT
POWDER MORCELSS FINE 2000MG (Miscellaneous) IMPLANT
PWDR MORCELSS FINE 2000MG (Miscellaneous) ×2 IMPLANT
SUT VIC AB 5-0 PS2 18 (SUTURE) IMPLANT
SYR BULB IRRIG 60ML STRL (SYRINGE) IMPLANT
TOWEL GREEN STERILE (TOWEL DISPOSABLE) ×2 IMPLANT
TOWEL GREEN STERILE FF (TOWEL DISPOSABLE) ×2 IMPLANT

## 2022-07-22 NOTE — Transfer of Care (Signed)
Immediate Anesthesia Transfer of Care Note  Patient: [name]  Procedure(s) Performed: EVACUATION HEMATOMA LEFT BACK WITH EXCISION OF WOUND (Left: Back) APPLICATION OF WOUND VAC AND MYRIAD (Left: Back) INCISION AND DRAINAGE RIGHT ANKLE (Right: Ankle)  Patient Location: PACU  Anesthesia Type:General  Level of Consciousness: drowsy and patient cooperative  Airway & Oxygen Therapy: Patient Spontanous Breathing and Patient connected to nasal cannula oxygen  Post-op Assessment: Report given to RN and Post -op Vital signs reviewed and stable  Post vital signs: Reviewed and stable  Last Vitals:  Vitals Value Taken Time  BP 119/94 07/22/22 1605  Temp    Pulse 99 07/22/22 1609  Resp 15 07/22/22 1609  SpO2 97 % 07/22/22 1609  Vitals shown include unvalidated device data.  Last Pain:  Vitals:   07/22/22 1304  TempSrc:   PainSc: 7          Complications: No notable events documented.
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
## 2022-07-22 NOTE — Anesthesia Procedure Notes (Signed)
Procedure Name: Intubation Date/Time: 07/22/2022 3:05 PM  Performed by: Audie Pinto, CRNAPre-anesthesia Checklist: Patient identified, Emergency Drugs available, Suction available and Patient being monitored Patient Re-evaluated:Patient Re-evaluated prior to induction Oxygen Delivery Method: Circle system utilized Preoxygenation: Pre-oxygenation with 100% oxygen Induction Type: IV induction Ventilation: Mask ventilation without difficulty Laryngoscope Size: Mac and 4 Grade View: Grade I Tube type: Oral Tube size: 7.0 mm Number of attempts: 1 Airway Equipment and Method: Stylet and Oral airway Placement Confirmation: ETT inserted through vocal cords under direct vision, positive ETCO2 and breath sounds checked- equal and bilateral Secured at: 21 cm Tube secured with: Tape Dental Injury: Teeth and Oropharynx as per pre-operative assessment

## 2022-07-22 NOTE — Anesthesia Postprocedure Evaluation (Signed)
Anesthesia Post Note  Patient: [name]  Procedure(s) Performed: EVACUATION HEMATOMA LEFT BACK WITH EXCISION OF WOUND (Left: Back) APPLICATION OF WOUND VAC AND MYRIAD (Left: Back) INCISION AND DRAINAGE RIGHT ANKLE (Right: Ankle)     Patient location during evaluation: PACU Anesthesia Type: General Level of consciousness: awake and alert Pain management: pain level controlled Vital Signs Assessment: post-procedure vital signs reviewed and stable Respiratory status: spontaneous breathing, nonlabored ventilation, respiratory function stable and patient connected to nasal cannula oxygen Cardiovascular status: blood pressure returned to baseline and stable Postop Assessment: no apparent nausea or vomiting Anesthetic complications: no   No notable events documented.  Last Vitals:  Vitals:   07/22/22 1645 07/22/22 1700  BP: 104/60 (!) 115/97  Pulse: 74 80  Resp: 12 19  Temp:  (!) 36.4 C  SpO2: 93% 93%    Last Pain:  Vitals:   07/22/22 1700  TempSrc:   PainSc: 0-No pain                 Trevor Iha

## 2022-07-22 NOTE — Discharge Instructions (Signed)
Wound Care   Guide to Wound Care  Proper wound care may reduce the risk of infection, improve healing rates, and limit scarring.  This is a general guide to help care for and manage wounds treated with product wound matrix.   Dressing Changes The frequency of dressing changes can vary based on which product was applied, the size of the wound, or the amount of wound drainage. Dressing inspections are recommended, at least weekly.   If you have a Wound VAC it will be changed in one week after the first time it is applied.  Then it will be changed once or twice a week.   If you don't have a Wound VAC, then place KY gel on the wound daily and cover with gauze.  Dressing Types Primary Dressing:  Non-adherent dressing goes directly over wounds being treated with the powder or sheet.  Secondary Dressing:  Secures the primary dressing in place and provides extra protection, compression, and absorption.  1. Wash Hands - To help decrease the risk of infection, caregivers should wash their hands for a minimum of 20 seconds and may use medical gloves.   2. Remove the Dressings - Avoid removing product from the wound by carefully removing the applicable dressing(s) at the time points recommended above, or as recommended by the treating physician.  Expected Color and Odor:  It is entirely normal for the wound to have an unpleasant odor and to form a caramel-colored gel as the product absorbs into the wound. It is important to leave this gel on the wound site.  3. Clean the Wound - Use clean water or saline to gently rinse around the wound surface and remove any excess discharge that may be present on the wound. Do not wipe off any of the caramel-colored gel on the wound.   What to look out for:  Large or increased amount of drainage   Surrounding skin has worsening redness or hot to touch   Increased pain in or around the wound   Flu-like symptoms, fatigue, decreased appetite, fever   Hard, crusty wound  surface with black or brown coloring  4. Apply New Dressings - Dressings should cover the entire wound and be suitable for maintaining a moist wound environment.  The non-adherent mesh dressing should be left in place.  New dressing should consist of KY Jelly to keep the wound moist and soft gauze secured with a wrap or tape.   Maintain a Hydrated Wound Area It is important to keep the wound area moist throughout the healing process. If the wound appears to be dry during dressing changes, select a dressing that will hydrate the wound and maintain that ideal moist environment. If you are unsure what to do, ask the treating physician.  Remodeling Process Every patient heals differently, and no two cases are the same. The size and location of the wound, product type and layering configurations, and general patient health all contribute to how quickly a wound will heal.  While many factors can influence the rate at which the product absorbs, the following can be used as a general guide.   THINGS TO DO: Refrain from smoking High protein diet with plenty of vegetables and some fruit  Limit simple processed carbohydrates and sugar Protect the wound from trauma Protect the dressing  powder       Sheet            Sorbact dressing  

## 2022-07-23 ENCOUNTER — Encounter: Payer: Self-pay | Admitting: Family Medicine

## 2022-07-23 ENCOUNTER — Telehealth: Payer: Self-pay | Admitting: Family Medicine

## 2022-07-23 ENCOUNTER — Encounter (HOSPITAL_COMMUNITY): Payer: Self-pay | Admitting: Plastic Surgery

## 2022-07-23 DIAGNOSIS — T8189XA Other complications of procedures, not elsewhere classified, initial encounter: Secondary | ICD-10-CM | POA: Diagnosis not present

## 2022-07-23 DIAGNOSIS — S91001A Unspecified open wound, right ankle, initial encounter: Secondary | ICD-10-CM | POA: Diagnosis not present

## 2022-07-23 DIAGNOSIS — S329XXA Fracture of unspecified parts of lumbosacral spine and pelvis, initial encounter for closed fracture: Secondary | ICD-10-CM | POA: Insufficient documentation

## 2022-07-23 NOTE — Telephone Encounter (Signed)
Spoke with pt advised that Dr Clent Ridges was waiting on some info regarding first day off work and return day to work, pt provided information and Dr Clent Ridges as been updated.
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
## 2022-07-23 NOTE — Telephone Encounter (Signed)
Pt paperwork was printed and placed on Dr Clent Ridges red folder for completing

## 2022-07-23 NOTE — Telephone Encounter (Signed)
Spoke with pt aware that paperwork is in the process of completing

## 2022-07-23 NOTE — Discharge Summary (Signed)
Physician Discharge Summary  Patient ID: Mikael Debell MRN: [id number] DOB/AGE: 1955-03-28 67 y.o.  Admit date: 07/22/2022 Discharge date: 07/23/2022  Admission Diagnoses:  Discharge Diagnoses:  Principal Problem:   Hematoma and contusion   Discharged Condition: good  Hospital Course: Patient is a 67 year old female with a back wound and right ankle wound.  She underwent evacuation of back hematoma/seroma, excision of back wound, placement of myriad and VAC placement to back wound, excision of right ankle wound and placement of myriad to right ankle wound with Dr. Ulice Bold yesterday 07/22/2022.  She stayed overnight for observation.  Patient reports this a.m. she is feeling well, reports some mild tenderness but otherwise no acute overnight events.  She has some questions about discharge planning and wound care.  I discussed with the patient that she will need to have 1-2 times weekly wound VAC changes to the back wound, we discussed that she will likely have the wound VAC for a few weeks to a few months pending her improvement.  We discussed any questions she had related to surgery yesterday.  Patient is scheduled to have home health assistance with wound VAC changes once weekly.  She will also follow-up with our clinic once weekly.  Initial follow-up scheduled for 07/28/2022, initial appointment with home health is 07/29/2022.  Patient is stable for discharge and comfortable with going home.  She is eager to leave.  She feels well and reports her pain is well-controlled.  Consults: None  Significant Diagnostic Studies: none  Treatments: IV hydration, antibiotics: Ancef, analgesia: acetaminophen and oxycodone  Discharge Exam: Blood pressure 120/63, pulse 81, temperature 97.7 F (36.5 C), temperature source Oral, resp. rate 18, height 5\' 1"  (1.549 m), weight 50.1 kg, SpO2 100 %. General appearance: alert, cooperative, no distress, and resting in bed Head: Normocephalic, without  obvious abnormality, atraumatic Resp: unlabored Extremities: No lower extremity edema is noted, dressing noted over right lower extremity, and soiled.  Distal lower extremities warm to touch.  Incision/Wound: Wound VAC in place with approximately 200 cc of serosanguineous drainage noted in canister.  Good seal noted.  No erythema or cellulitic changes noted.  Disposition: Discharge disposition: 01-Home or Self Care       Discharge Instructions     Call MD for:  difficulty breathing, headache or visual disturbances   Complete by: As directed    Call MD for:  extreme fatigue   Complete by: As directed    Call MD for:  hives   Complete by: As directed    Call MD for:  persistant dizziness or light-headedness   Complete by: As directed    Call MD for:  persistant nausea and vomiting   Complete by: As directed    Call MD for:  redness, tenderness, or signs of infection (pain, swelling, redness, odor or green/yellow discharge around incision site)   Complete by: As directed    Call MD for:  severe uncontrolled pain   Complete by: As directed    Call MD for:  temperature >100.4   Complete by: As directed    Diet - low sodium heart healthy   Complete by: As directed    Increase activity slowly   Complete by: As directed       Allergies as of 07/23/2022   No Known Allergies      Medication List     TAKE these medications    Acetaminophen Extra Strength 500 MG Tabs Take 1,000 mg by mouth as needed (pain).  amphetamine-dextroamphetamine 20 MG 24 hr capsule Commonly known as: Adderall XR Take 1 capsule (20 mg total) by mouth every morning.   celecoxib 200 MG capsule Commonly known as: CeleBREX Take 1 capsule (200 mg total) by mouth 2 (two) times daily.   diazepam 5 MG tablet Commonly known as: VALIUM Take 1 tablet (5 mg total) by mouth every 8 (eight) hours as needed for anxiety.   estradiol 0.1 MG/GM vaginal cream Commonly known as: ESTRACE VAGINAL Apply a  fingertip amount to urethra 2 times per week. What changed:  how much to take when to take this   HYDROmorphone 8 MG tablet Commonly known as: DILAUDID Take 1 tablet (8 mg total) by mouth every 8 (eight) hours as needed for severe pain. What changed: when to take this   nystatin 100000 UNIT/ML suspension Commonly known as: MYCOSTATIN Take 5 mLs (500,000 Units total) by mouth 4 (four) times daily. What changed:  when to take this reasons to take this   Oxycodone HCl 20 MG Tabs Take 1 tablet (20 mg total) by mouth every 4 (four) hours as needed (pain). What changed: when to take this   polyethylene glycol powder 17 GM/SCOOP powder Commonly known as: GLYCOLAX/MIRALAX TAKE 17 GRAMS BY MOUTH 2 TIMES DAILY AS NEEDED. What changed: See the new instructions.   pregabalin 300 MG capsule Commonly known as: LYRICA Take 1 capsule (300 mg total) by mouth 2 (two) times daily.   Restasis 0.05 % ophthalmic emulsion Generic drug: cycloSPORINE Place 1 drop into both eyes 2 times daily. What changed:  how much to take how to take this when to take this   tiZANidine 2 MG tablet Commonly known as: ZANAFLEX Take 1 tablet by mouth every 6 to 8 hours as needed for muscle spasms   valACYclovir 1000 MG tablet Commonly known as: VALTREX Take 1 tablet by mouth once daily. What changed: Another medication with the same name was changed. Make sure you understand how and when to take each.   valACYclovir 1000 MG tablet Commonly known as: Valtrex Take 1 tablet (1,000 mg total) by mouth 3 (three) times daily. What changed: when to take this   verapamil 120 MG CR tablet Commonly known as: CALAN-SR Take 1 tablet (120 mg total) by mouth at bedtime.        Follow-up Information     Dillingham, [name], DO Follow up in 1 week(s).   Specialty: Plastic Surgery Contact information: [address] [place] [address] [phone]         [facility] Follow up.    Specialty: Home Health Services Contact information: [address] [place] [address] [phone]                 [facility] [address] [place], [place] [address] [phone]  Signed: [name] Mcguire Gasparyan 07/23/2022, 2:22 PM

## 2022-07-23 NOTE — Telephone Encounter (Signed)
Patient checking on progress of FMLA paperwork faxed in recently

## 2022-07-24 ENCOUNTER — Telehealth: Payer: Self-pay | Admitting: Plastic Surgery

## 2022-07-24 NOTE — Telephone Encounter (Signed)
Pt FMLA forms faxed to the fax number provided

## 2022-07-24 NOTE — Telephone Encounter (Signed)
Pt called and reported that the wound vac she had put in yesterday is leaking.  Her friend said that it looks like the part that goes in her back is bent or broken.  They tried taping it up but it won't stay and the machine keeps beeping and it is not drawing anything out.  She has an appt on Tues. 7/2 with Matt.  Home Health is not scheduled to come until Wed 7/3.  Please call her at [phone].

## 2022-07-24 NOTE — Telephone Encounter (Signed)
Spoke with patient as well as Dr. Ulice Bold. Fortunately Dr. Ulice Bold will be in clinic tomorrow and will try to accommodate patient between 8-9 AM for wound VAC change. Patient understands to bring her equipment with her to tomorrow's visit.  She will try supportive measures including additional tape in the interim.

## 2022-07-25 ENCOUNTER — Ambulatory Visit (INDEPENDENT_AMBULATORY_CARE_PROVIDER_SITE_OTHER): Payer: Commercial Managed Care - PPO | Admitting: Plastic Surgery

## 2022-07-25 DIAGNOSIS — T148XXA Other injury of unspecified body region, initial encounter: Secondary | ICD-10-CM

## 2022-07-25 DIAGNOSIS — Z9889 Other specified postprocedural states: Secondary | ICD-10-CM

## 2022-07-25 NOTE — Progress Notes (Signed)
The patient is a 67 year old female here because her VAC stopped working last night.  We went ahead and did a VAC change everything looks really good.  The fluid output is fairly high but as expected.  No sign of hematoma or infection.  Will plan to see the patient next week.

## 2022-07-27 ENCOUNTER — Other Ambulatory Visit: Payer: Self-pay | Admitting: Family Medicine

## 2022-07-27 ENCOUNTER — Telehealth: Payer: Self-pay

## 2022-07-27 MED ORDER — PREGABALIN 300 MG PO CAPS: 300.0000 mg | ORAL_CAPSULE | Freq: Two times a day (BID) | ORAL | 5 refills | Status: DC

## 2022-07-27 NOTE — Transitions of Care (Post Inpatient/ED Visit) (Signed)
07/27/2022  Name: [name] MRN: [id number] DOB: 12/02/1955  Today's TOC FU Call Status: Today's TOC FU Call Status:: Successful TOC FU Call Competed TOC FU Call Complete Date: 07/27/22  Transition Care Management Follow-up Telephone Call Date of Discharge: 07/23/22 Discharge Facility: [facility]) Type of Discharge: Inpatient Admission Primary Inpatient Discharge Diagnosis:: Hematoma and contusion How have you been since you were released from the hospital?: Better Any questions or concerns?: No  Items Reviewed: Did you receive and understand the discharge instructions provided?: Yes Medications obtained,verified, and reconciled?: Yes (Medications Reviewed) Any new allergies since your discharge?: No Dietary orders reviewed?: Yes Do you have support at home?: Yes  Medications Reviewed Today: Medications Reviewed Today     Reviewed by Merleen Nicely, LPN (Licensed Practical Nurse) on 07/27/22 at 1112  Med List Status: <None>   Medication Order Taking? Sig Documenting Provider Last Dose Status Informant  Acetaminophen Extra Strength 500 MG TABS [id number] Yes Take 1,000 mg by mouth as needed (pain). [provider] Taking Active Self, Pharmacy Records  amphetamine-dextroamphetamine (ADDERALL XR) 20 MG 24 hr capsule [id number] Yes Take 1 capsule (20 mg total) by mouth every morning. Nelwyn Salisbury, MD Taking Active Self, Pharmacy Records           Med Note Epimenio Sarin, Madelon Lips Jul 23, 2022  8:07 AM) LF 05/27/22 for 30 DS. Pt is adamant she still has this medication at home and is taking it daily.   celecoxib (CELEBREX) 200 MG capsule [id number] Yes Take 1 capsule (200 mg total) by mouth 2 (two) times daily. Nelwyn Salisbury, MD Taking Active Self, Pharmacy Records           Med Note Epimenio Sarin, Madelon Lips Jul 23, 2022  8:07 AM) LF 11/23 for 90 DS. Pt is adamant she still has this medication at home and is taking it daily.    cycloSPORINE (RESTASIS) 0.05  % ophthalmic emulsion [id number] Yes Place 1 drop into both eyes 2 times daily.  Patient taking differently: Place 2 drops into both eyes 2 (two) times daily.    Taking Active Self, Pharmacy Records           Med Note Epimenio Sarin, Madelon Lips Jul 23, 2022  8:08 AM) LF 04/24 for 30 DS. Pt is adamant she still has this medication at home and is taking it daily.    diazepam (VALIUM) 5 MG tablet [id number] No Take 1 tablet (5 mg total) by mouth every 8 (eight) hours as needed for anxiety.  Patient not taking: Reported on 07/27/2022   Nelwyn Salisbury, MD Not Taking Active Self, Pharmacy Records  estradiol [facility] VAGINAL) 0.1 MG/GM vaginal cream [id number] Yes Apply a fingertip amount to urethra 2 times per week.  Patient taking differently: Place 1 Applicatorful vaginally once a week.    Taking Active Self, Pharmacy Records           Med Note Rich Number Apr 15, 2022 11:23 AM)    HYDROmorphone (DILAUDID) 8 MG tablet [id number] Yes Take 1 tablet (8 mg total) by mouth every 8 (eight) hours as needed for severe pain.  Patient taking differently: Take 8 mg by mouth daily as needed for severe pain.   Nelwyn Salisbury, MD Taking Active Self, Pharmacy Records  nystatin (MYCOSTATIN) 100000 UNIT/ML suspension [id number] Yes Take 5 mLs (500,000 Units total) by mouth 4 (four) times daily.  Patient taking differently:  Take 5 mLs by mouth as needed (thrush).   Nelwyn Salisbury, MD Taking Active Self, Pharmacy Records  Oxycodone HCl 20 MG TABS [id number] Yes Take 1 tablet (20 mg total) by mouth every 4 (four) hours as needed (pain).  Patient taking differently: Take 20 mg by mouth 2 (two) times daily as needed (pain).   Nelwyn Salisbury, MD Taking Active Self, Pharmacy Records  polyethylene glycol powder [facility]) powder [id number] Yes TAKE 17 GRAMS BY MOUTH 2 TIMES DAILY AS NEEDED.  Patient taking differently: Take 17 g by mouth as needed for moderate constipation.   Nelwyn Salisbury, MD Taking Active  Self, Pharmacy Records  pregabalin Carbon [facility]) 300 MG capsule [id number] Yes Take 1 capsule (300 mg total) by mouth 2 (two) times daily. Nelwyn Salisbury, MD Taking Active   tiZANidine (ZANAFLEX) 2 MG tablet [id number] Yes Take 1 tablet by mouth every 6 to 8 hours as needed for muscle spasms [place], Jesse J, PA-C Taking Active Self, Pharmacy Records  valACYclovir (VALTREX) 1000 MG tablet [id number] Yes Take 1 tablet by mouth once daily.  Taking Active Self, Pharmacy Records           Med Note (CRUTHIS, Marcy Siren   Thu Jul 23, 2022  8:04 AM) Unable to remove entry without D/C refills.   valACYclovir (VALTREX) 1000 MG tablet [id number] Yes Take 1 tablet (1,000 mg total) by mouth 3 (three) times daily.  Patient taking differently: Take 1,000 mg by mouth daily.   Nelwyn Salisbury, MD Taking Active Self, Pharmacy Records           Med Note Epimenio Sarin, Madelon Lips Jul 23, 2022  8:10 AM) LF 02/24 for 90 DS. Pt is adamant she still has this medication at home and is taking it daily.    verapamil (CALAN-SR) 120 MG CR tablet [id number] Yes Take 1 tablet (120 mg total) by mouth at bedtime. Nelwyn Salisbury, MD Taking Active Self, Pharmacy Records           Med Note Epimenio Sarin, Madelon Lips Jul 23, 2022  8:10 AM) LF 02/24 for 90 Ds. Pt is adamant she still has this medication at home and is taking it daily.              Home Care and Equipment/Supplies: Were Home Health Services Ordered?: No Any new equipment or medical supplies ordered?: No  Functional Questionnaire: Do you need assistance with bathing/showering or dressing?: No Do you need assistance with meal preparation?: No Do you need assistance with eating?: No Do you have difficulty maintaining continence: No Do you need assistance with getting out of bed/getting out of a chair/moving?: No Do you have difficulty managing or taking your medications?: No  Follow up appointments reviewed: PCP Follow-up appointment confirmed?: No MD Provider Line  [phone] Given: Yes Specialist Hospital Follow-up appointment confirmed?: Yes Date of Specialist follow-up appointment?: 07/28/22 Follow-Up Specialty Provider:: Keenan Bachelor PA-C Do you understand care options if your condition(s) worsen?: Yes-patient verbalized understanding    SIGNATURE  Woodfin Ganja LPN [facility] Nurse Health Advisor Direct Dial [phone]

## 2022-07-28 ENCOUNTER — Ambulatory Visit (INDEPENDENT_AMBULATORY_CARE_PROVIDER_SITE_OTHER): Payer: Commercial Managed Care - PPO | Admitting: Surgical

## 2022-07-28 VITALS — BP 129/69 | HR 78 | Temp 97.5°F | Resp 18

## 2022-07-28 DIAGNOSIS — S91001A Unspecified open wound, right ankle, initial encounter: Secondary | ICD-10-CM | POA: Diagnosis not present

## 2022-07-28 DIAGNOSIS — S21202A Unspecified open wound of left back wall of thorax without penetration into thoracic cavity, initial encounter: Secondary | ICD-10-CM | POA: Diagnosis not present

## 2022-07-28 DIAGNOSIS — S31829A Unspecified open wound of left buttock, initial encounter: Secondary | ICD-10-CM

## 2022-07-28 DIAGNOSIS — T148XXA Other injury of unspecified body region, initial encounter: Secondary | ICD-10-CM

## 2022-07-28 DIAGNOSIS — M7062 Trochanteric bursitis, left hip: Secondary | ICD-10-CM | POA: Diagnosis not present

## 2022-07-28 NOTE — Progress Notes (Signed)
Patient is a 67 year old female here for follow-up after evacuation of back hematoma/seroma, excision of back wound, placement of myriad and wound VAC to back wound.  She also underwent excision of right ankle wound with application of myriad to right ankle.  Patient is accompanied by her friend who has been assisting her with recovery.  She was last seen in the office on 07/25/2022, reported that the [facility] was malfunctioning.  The VAC was replaced.  Patient reports today that the [facility] has not been functioning since Sunday.  She reports that the canister filled up very quickly with fluid, she brought the canister in today.  And when she went to change the canister she got an error message stating that the [facility] was malfunctioning.  She attempted to fix it but did not have any luck.  She reports she did not call our office because she was not sure if she could.    Chaperone present on exam On exam patient is well-developed, no acute distress, breathing is unlabored, she is up and ambulating. BP 129/69 (BP Location: Left Arm, Patient Position: Sitting, Cuff Size: Normal)   Pulse 78   Temp (!) 97.5 F (36.4 C) (Axillary)   Resp 18   SpO2 97%   Right lower extremity: Right lower extremity with palpable radial pulse.  There is no erythema or cellulitic changes surrounding the right ankle wound.  Sorbact is in place.  No tenderness is noted.  Left back wound: Left wound VAC is in place, disconnected from the wound VAC.  The VAC sponge was removed along with the VAC tape and approximately 60 to 80 cc of yellow serous fluid was expressed from the patient's low back.  Sorbact mesh was still in place, the lower incisions were clipped to assist with removal of the serous fluid.  There is no purulence noted.  No foul odors are noted.  She has minimal tenderness with palpation.  There is no cellulitic changes  A/P:  Left back wound VAC was replaced, instructed patient on importance of notifying our office if she  has any malfunction of the VAC.  Educated patient on location of phone number to [facility] in order to call their support if she develops any error messages with the wound VAC.  Provided patient with education on how to change the canister correctly.  Patient tolerated the wound VAC change.  She is scheduled to have home health follow-up with her tomorrow, asked patient if she can have them come Friday instead to change her VAC as tomorrow would be too soon.  She reports that she will call them to discuss.  In regards to her right lower extremity, recommend K-Y jelly, 4 4, Kerlix, Ace wrap to right lower extremity wound every other day.  There is no signs of infection or concern on exam.  Patient appears well.

## 2022-07-31 ENCOUNTER — Encounter: Payer: Commercial Managed Care - PPO | Admitting: Physician Assistant

## 2022-07-31 DIAGNOSIS — T148XXA Other injury of unspecified body region, initial encounter: Secondary | ICD-10-CM | POA: Diagnosis not present

## 2022-08-01 DIAGNOSIS — T148XXA Other injury of unspecified body region, initial encounter: Secondary | ICD-10-CM | POA: Diagnosis not present

## 2022-08-02 ENCOUNTER — Other Ambulatory Visit: Payer: Self-pay | Admitting: Family Medicine

## 2022-08-03 ENCOUNTER — Other Ambulatory Visit: Payer: Self-pay

## 2022-08-03 ENCOUNTER — Telehealth: Payer: Self-pay | Admitting: Plastic Surgery

## 2022-08-03 ENCOUNTER — Other Ambulatory Visit (HOSPITAL_COMMUNITY): Payer: Self-pay

## 2022-08-03 ENCOUNTER — Telehealth: Payer: Self-pay | Admitting: Family Medicine

## 2022-08-03 MED ORDER — VALACYCLOVIR HCL 1 G PO TABS
1000.0000 mg | ORAL_TABLET | Freq: Three times a day (TID) | ORAL | 1 refills | Status: AC
  Filled 2022-08-03: qty 30, 10d supply, fill #0

## 2022-08-03 NOTE — Progress Notes (Unsigned)
Patient is a pleasant 67 year old female with history of low back hematoma s/p evacuation with placement of myriad and wound VAC as well as excision of right ankle wound with placement of myriad who presents to clinic for postoperative follow-up.  She was last seen here in clinic on 07/28/2022.  At that time, the wound VAC had been malfunctioning and after removal there was a moderate amount of yellow serous fluid on exam.  No evidence concerning for infection.  Wound VAC performed to low back.  Recommending continued K-Y jelly followed by 4 x 4 gauze, Kerlix, and Ace wrap to the right lower extremity wound changed every other day.  Today,

## 2022-08-03 NOTE — Telephone Encounter (Signed)
Spoke with Pam from [facility], she reports the patient has had a lot of difficulty with keeping the wound VAC on.  She reports that the patient has pulled off the wound VAC a few times now.  She reports that the patient is having to change the canister frequently due to significant amount of drainage.  She reports that they are changing the VAC twice weekly.  Patient is coming to see [place] tomorrow for further evaluation.  We discussed options for making the wound VAC more secure including Ace wrap's around her abdomen or abdominal binders.  We will discuss this with the patient tomorrow.  We discussed options for wet-to-dry dressings as well if patient cannot adhere to [facility] treatment plan.

## 2022-08-03 NOTE — Telephone Encounter (Signed)
Pam from [facility] called for patient to seek out orders but also said they would like to speak to someone clinical about the type of transparent tape they use for the patient. Call back is [phone]

## 2022-08-03 NOTE — Telephone Encounter (Addendum)
Caron Presume, RN - [facility] [phone]  Verbal Order - Continuation of Care

## 2022-08-04 ENCOUNTER — Other Ambulatory Visit: Payer: Self-pay | Admitting: Family Medicine

## 2022-08-04 ENCOUNTER — Ambulatory Visit (INDEPENDENT_AMBULATORY_CARE_PROVIDER_SITE_OTHER): Payer: Commercial Managed Care - PPO | Admitting: Physician Assistant

## 2022-08-04 ENCOUNTER — Other Ambulatory Visit (HOSPITAL_COMMUNITY): Payer: Self-pay

## 2022-08-04 ENCOUNTER — Encounter: Payer: Self-pay | Admitting: Plastic Surgery

## 2022-08-04 DIAGNOSIS — S31000D Unspecified open wound of lower back and pelvis without penetration into retroperitoneum, subsequent encounter: Secondary | ICD-10-CM

## 2022-08-04 MED ORDER — AMPHETAMINE-DEXTROAMPHET ER 20 MG PO CP24
20.0000 mg | ORAL_CAPSULE | ORAL | 0 refills | Status: DC
  Filled 2022-08-04: qty 30, 30d supply, fill #0

## 2022-08-04 MED ORDER — AMPHETAMINE-DEXTROAMPHET ER 20 MG PO CP24
20.0000 mg | ORAL_CAPSULE | ORAL | 0 refills | Status: DC
  Filled 2022-08-04 – 2022-09-08 (×2): qty 30, 30d supply, fill #0

## 2022-08-04 NOTE — Telephone Encounter (Signed)
Pt called the pharmacy and was told they do not have this Rx for her.   Pt is officially out of this medication.  Please send refill asap to:  [facility] Phone: [phone]  Fax: [phone]

## 2022-08-04 NOTE — Telephone Encounter (Signed)
Pt LOV was on 07/09/22 Last refill was done on 05/27/22 Please advise

## 2022-08-04 NOTE — Telephone Encounter (Signed)
Please okay these orders  ?

## 2022-08-04 NOTE — Telephone Encounter (Signed)
I spoke with Rinaldo Cloud, she had already received the orders from the surgeon's office.

## 2022-08-04 NOTE — Telephone Encounter (Signed)
Done

## 2022-08-05 ENCOUNTER — Telehealth: Payer: Self-pay | Admitting: Physician Assistant

## 2022-08-05 ENCOUNTER — Other Ambulatory Visit (HOSPITAL_COMMUNITY): Payer: Self-pay

## 2022-08-05 ENCOUNTER — Other Ambulatory Visit: Payer: Self-pay

## 2022-08-05 ENCOUNTER — Other Ambulatory Visit: Payer: Self-pay | Admitting: Family Medicine

## 2022-08-05 NOTE — Telephone Encounter (Signed)
Pt left vm.  She stated that the drainage from her wound vac has changed in color.  She sent a picture and message through MyChart.  She would like someone to call her at [phone]

## 2022-08-05 NOTE — Telephone Encounter (Signed)
Done, thanks

## 2022-08-05 NOTE — Telephone Encounter (Signed)
Pt LOV/PMV was on 05/27/22 Last refill was done on 07/09/22 Please advise

## 2022-08-06 ENCOUNTER — Encounter: Payer: Self-pay | Admitting: Family Medicine

## 2022-08-06 DIAGNOSIS — S82891A Other fracture of right lower leg, initial encounter for closed fracture: Secondary | ICD-10-CM | POA: Diagnosis not present

## 2022-08-07 ENCOUNTER — Other Ambulatory Visit: Payer: Self-pay | Admitting: Family Medicine

## 2022-08-07 ENCOUNTER — Other Ambulatory Visit (HOSPITAL_COMMUNITY): Payer: Self-pay

## 2022-08-07 ENCOUNTER — Ambulatory Visit: Payer: Commercial Managed Care - PPO | Admitting: Family Medicine

## 2022-08-07 DIAGNOSIS — T148XXA Other injury of unspecified body region, initial encounter: Secondary | ICD-10-CM | POA: Diagnosis not present

## 2022-08-07 DIAGNOSIS — T8189XA Other complications of procedures, not elsewhere classified, initial encounter: Secondary | ICD-10-CM | POA: Diagnosis not present

## 2022-08-07 MED ORDER — PREGABALIN 300 MG PO CAPS
300.0000 mg | ORAL_CAPSULE | Freq: Two times a day (BID) | ORAL | 5 refills | Status: DC
  Filled 2022-08-07: qty 60, 30d supply, fill #0
  Filled 2022-09-10: qty 60, 30d supply, fill #1
  Filled 2022-10-17: qty 60, 30d supply, fill #2
  Filled 2022-12-03: qty 60, 30d supply, fill #3
  Filled 2023-01-04: qty 60, 30d supply, fill #4
  Filled 2023-02-01: qty 60, 30d supply, fill #5

## 2022-08-07 NOTE — Telephone Encounter (Signed)
Hi Joss, can you reply to Antaniyah re: difficulty getting the necessary wound VAC supplies?

## 2022-08-07 NOTE — Telephone Encounter (Signed)
Rx did not go through to pt pharmacy since its a control, please send refill

## 2022-08-11 ENCOUNTER — Encounter: Payer: Self-pay | Admitting: Physician Assistant

## 2022-08-11 ENCOUNTER — Other Ambulatory Visit (HOSPITAL_COMMUNITY): Payer: Self-pay

## 2022-08-11 ENCOUNTER — Ambulatory Visit: Payer: Commercial Managed Care - PPO | Admitting: Physician Assistant

## 2022-08-11 ENCOUNTER — Other Ambulatory Visit: Payer: Self-pay

## 2022-08-11 VITALS — BP 186/92 | HR 85 | Ht 61.5 in | Wt 107.0 lb

## 2022-08-11 DIAGNOSIS — S91001D Unspecified open wound, right ankle, subsequent encounter: Secondary | ICD-10-CM

## 2022-08-11 DIAGNOSIS — S31000D Unspecified open wound of lower back and pelvis without penetration into retroperitoneum, subsequent encounter: Secondary | ICD-10-CM

## 2022-08-11 MED ORDER — DOXYCYCLINE HYCLATE 100 MG PO TABS
100.0000 mg | ORAL_TABLET | Freq: Two times a day (BID) | ORAL | 0 refills | Status: DC
  Filled 2022-08-11: qty 14, 7d supply, fill #0

## 2022-08-11 NOTE — Progress Notes (Signed)
Patient is a pleasant 67 year old female with history of low back hematoma s/p evacuation with placement of myriad and wound VAC as well as excision of right ankle wound with placement of myriad performed 07/22/2022 by Dr. Ulice Bold who presents to clinic for postoperative follow-up.  She was last seen here in clinic on 08/04/2022.  At that time, VAC was removed and back wound measured 7 x 3 cm in size with additional 3.5 cm in tunneling and depth inferiorly.  Fibrinous tissue with liquefied slough noted at the base of wound.  Some granulation tissue noted, but no significant change since initial VAC placement.  As for her lower extremity wound, recommended continued K-Y jelly and Adaptic.  The inferior portion had not yet filled in, but superiorly was granulating well.  Unfortunately this appeared to be directly over the lateral malleolus which could be contributing to its difficulty healing.  She has been seeing [facility]N for VAC changes at home twice weekly.  Today, patient states that she is having some discomfort with ambulation at the site of her ankle wound.  She states that she has been applying the Adaptic and K-Y jelly dressings, as instructed.  However, that small area noted at previous encounter has not shown any considerable progress.  She also feels as though there could be some increasing redness.  She denies any fevers or other systemic symptoms.  As for her low back, continues to endorse slight malodor, but possibly improved compared to previous encounter.  She has been getting the wound VAC change regularly.  No issues since last visit.  On exam, she has a 1 x 1 cm wound at the inferior aspect of her right ankle wound, unstageable due to fibrinous slough.  At least 0.5 cm depth.  Slight surrounding erythema that is mildly indurated, slight tenderness to palpation.  No crepitus or fluctuance.  No significant malodor.  Excellent granular tissue over the proximal 75% of the wound.  Wound VAC is removed  from the low back and there is excellent granulation tissue over the upper half of the wound VAC placement site.  However, where the white sponge has been placed in the area that is tunneling, there has not been any considerable improvement with regard to granulation and wound healing.  However, it is not draining as much as most recent encounter, nor as malodorous.  No significant surrounding erythema or induration concerning for infection, however there is evidence of tape dermatitis.  Wound VAC change performed to the low back, good seal at the conclusion of the procedure.  As for the right ankle wound, there has not been any considerable improvement on the inferior aspect of the wound.  She also was endorsing some new discomfort with ambulation and is mildly tender and indurated around the wound site.  Will prescribe antibiotics and obtain plain films to assess for possible bony erosion or other indications of osteomyelitis.  If there is any concern, would proceed with MRI for further clarification.  Discussed case with Dr. Ulice Bold who was agreeable with plan.  Picture(s) obtained of the patient and placed in the chart were with the patient's or guardian's permission.   Called patient regarding her hypertension later in the day, particularly given that she is typically well-controlled. She denies any fatigue, chest pain, dyspnea, headache, back pain, blurred vision, dizziness, or other symptoms concerning for ACS. She has a BP cuff at home and rechecked.  It was 168/95.  She states that this is much higher than usual, but  she is confident as to why.  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  However, last night she picked her up and it has been a bit stressful for her today.  She states that the stress from having to care for her while also caring for herself is likely contributing to her elevated blood  pressure.  She will continue to check it daily at home and will follow-up with her PCP for ongoing management if needed.

## 2022-08-13 ENCOUNTER — Telehealth: Payer: Self-pay | Admitting: Physician Assistant

## 2022-08-13 NOTE — Telephone Encounter (Signed)
Patient called and said she was told to follow up with Gerre Pebbles if things changed. She said the pain in her right ankle has got so bad that she can't put any pressure on it. Please advise

## 2022-08-13 NOTE — Telephone Encounter (Signed)
Called and spoke with patient. Right ankle trimalleolar fracture with ORIF in January, subsequent exposure and removal of hardware in March. She has been treated by our office for chronic wound since then. There has been an area over lateral malleolus that has not had any considerable granulation since recent wound matrix placement. Saw her earlier this week, no significant change on exam, but started her on doxycycline and ordered plain films given one day of soreness. Given the rapid progression in pain and antalgia, recommending f/u with her orthopedic surgeon today if possible. Denies fevers.

## 2022-08-14 ENCOUNTER — Encounter: Payer: Commercial Managed Care - PPO | Admitting: Plastic Surgery

## 2022-08-14 ENCOUNTER — Other Ambulatory Visit: Payer: Self-pay

## 2022-08-14 ENCOUNTER — Inpatient Hospital Stay (HOSPITAL_COMMUNITY)
Admission: EM | Admit: 2022-08-14 | Discharge: 2022-08-22 | DRG: 857 | Disposition: A | Discharge status: Home Health | Payer: Commercial Managed Care - PPO | Attending: Internal Medicine | Admitting: Internal Medicine

## 2022-08-14 ENCOUNTER — Encounter (HOSPITAL_COMMUNITY): Payer: Self-pay

## 2022-08-14 ENCOUNTER — Emergency Department (HOSPITAL_COMMUNITY): Payer: Commercial Managed Care - PPO

## 2022-08-14 DIAGNOSIS — T8149XA Infection following a procedure, other surgical site, initial encounter: Principal | ICD-10-CM | POA: Diagnosis present

## 2022-08-14 DIAGNOSIS — S31000D Unspecified open wound of lower back and pelvis without penetration into retroperitoneum, subsequent encounter: Secondary | ICD-10-CM

## 2022-08-14 DIAGNOSIS — E876 Hypokalemia: Secondary | ICD-10-CM | POA: Diagnosis present

## 2022-08-14 DIAGNOSIS — F900 Attention-deficit hyperactivity disorder, predominantly inattentive type: Secondary | ICD-10-CM | POA: Diagnosis present

## 2022-08-14 DIAGNOSIS — L03115 Cellulitis of right lower limb: Secondary | ICD-10-CM | POA: Diagnosis present

## 2022-08-14 DIAGNOSIS — S82431A Displaced oblique fracture of shaft of right fibula, initial encounter for closed fracture: Secondary | ICD-10-CM | POA: Diagnosis not present

## 2022-08-14 DIAGNOSIS — M869 Osteomyelitis, unspecified: Secondary | ICD-10-CM

## 2022-08-14 DIAGNOSIS — M810 Age-related osteoporosis without current pathological fracture: Secondary | ICD-10-CM | POA: Diagnosis present

## 2022-08-14 DIAGNOSIS — L97319 Non-pressure chronic ulcer of right ankle with unspecified severity: Secondary | ICD-10-CM | POA: Diagnosis present

## 2022-08-14 DIAGNOSIS — E8809 Other disorders of plasma-protein metabolism, not elsewhere classified: Secondary | ICD-10-CM | POA: Diagnosis present

## 2022-08-14 DIAGNOSIS — S86319A Strain of muscle(s) and tendon(s) of peroneal muscle group at lower leg level, unspecified leg, initial encounter: Secondary | ICD-10-CM | POA: Diagnosis present

## 2022-08-14 DIAGNOSIS — Z79818 Long term (current) use of other agents affecting estrogen receptors and estrogen levels: Secondary | ICD-10-CM

## 2022-08-14 DIAGNOSIS — Z970 Presence of artificial eye: Secondary | ICD-10-CM

## 2022-08-14 DIAGNOSIS — Z791 Long term (current) use of non-steroidal anti-inflammatories (NSAID): Secondary | ICD-10-CM

## 2022-08-14 DIAGNOSIS — Z96611 Presence of right artificial shoulder joint: Secondary | ICD-10-CM | POA: Diagnosis present

## 2022-08-14 DIAGNOSIS — M89571 Osteolysis, right ankle and foot: Secondary | ICD-10-CM | POA: Diagnosis not present

## 2022-08-14 DIAGNOSIS — Z981 Arthrodesis status: Secondary | ICD-10-CM

## 2022-08-14 DIAGNOSIS — Z79899 Other long term (current) drug therapy: Secondary | ICD-10-CM

## 2022-08-14 DIAGNOSIS — I10 Essential (primary) hypertension: Secondary | ICD-10-CM | POA: Diagnosis present

## 2022-08-14 DIAGNOSIS — G894 Chronic pain syndrome: Secondary | ICD-10-CM | POA: Diagnosis present

## 2022-08-14 DIAGNOSIS — M86171 Other acute osteomyelitis, right ankle and foot: Secondary | ICD-10-CM | POA: Diagnosis not present

## 2022-08-14 DIAGNOSIS — E871 Hypo-osmolality and hyponatremia: Secondary | ICD-10-CM | POA: Diagnosis present

## 2022-08-14 DIAGNOSIS — D509 Iron deficiency anemia, unspecified: Secondary | ICD-10-CM | POA: Diagnosis present

## 2022-08-14 DIAGNOSIS — Z8614 Personal history of Methicillin resistant Staphylococcus aureus infection: Secondary | ICD-10-CM

## 2022-08-14 DIAGNOSIS — Z472 Encounter for removal of internal fixation device: Secondary | ICD-10-CM | POA: Diagnosis not present

## 2022-08-14 DIAGNOSIS — Z96612 Presence of left artificial shoulder joint: Secondary | ICD-10-CM | POA: Diagnosis present

## 2022-08-14 DIAGNOSIS — M25571 Pain in right ankle and joints of right foot: Secondary | ICD-10-CM | POA: Diagnosis not present

## 2022-08-14 DIAGNOSIS — I7300 Raynaud's syndrome without gangrene: Secondary | ICD-10-CM | POA: Diagnosis present

## 2022-08-14 DIAGNOSIS — Z803 Family history of malignant neoplasm of breast: Secondary | ICD-10-CM

## 2022-08-14 DIAGNOSIS — M868X6 Other osteomyelitis, lower leg: Secondary | ICD-10-CM | POA: Diagnosis present

## 2022-08-14 LAB — COMPREHENSIVE METABOLIC PANEL
ALT: 11 U/L (ref 0–44)
AST: 22 U/L (ref 15–41)
Albumin: 2.9 g/dL — ABNORMAL LOW (ref 3.5–5.0)
Alkaline Phosphatase: 93 U/L (ref 38–126)
Anion gap: 11 (ref 5–15)
BUN: 9 mg/dL (ref 8–23)
CO2: 23 mmol/L (ref 22–32)
Calcium: 8.5 mg/dL — ABNORMAL LOW (ref 8.9–10.3)
Chloride: 97 mmol/L — ABNORMAL LOW (ref 98–111)
Creatinine, Ser: 0.59 mg/dL (ref 0.44–1.00)
GFR, Estimated: >60 mL/min (ref >60)
Glucose, Bld: 115 mg/dL — ABNORMAL HIGH (ref 70–99)
Potassium: 3.9 mmol/L (ref 3.5–5.1)
Sodium: 131 mmol/L — ABNORMAL LOW (ref 135–145)
Total Bilirubin: 0.5 mg/dL (ref 0.3–1.2)
Total Protein: 7.5 g/dL (ref 6.5–8.1)

## 2022-08-14 LAB — LACTIC ACID, PLASMA: Lactic Acid, Venous: 1.3 mmol/L (ref 0.5–1.9)

## 2022-08-14 LAB — CBC WITH DIFFERENTIAL/PLATELET
Abs Immature Granulocytes: 0.18 10*3/uL — ABNORMAL HIGH (ref 0.00–0.07)
Basophils Absolute: 0 10*3/uL (ref 0.0–0.1)
Basophils Relative: 0 %
Eosinophils Absolute: 0.1 10*3/uL (ref 0.0–0.5)
Eosinophils Relative: 1 %
HCT: 33.3 % — ABNORMAL LOW (ref 36.0–46.0)
Hemoglobin: 10.5 g/dL — ABNORMAL LOW (ref 12.0–15.0)
Immature Granulocytes: 2 %
Lymphocytes Relative: 11 %
Lymphs Abs: 1.2 10*3/uL (ref 0.7–4.0)
MCH: 29.5 pg (ref 26.0–34.0)
MCHC: 31.5 g/dL (ref 30.0–36.0)
MCV: 93.5 fL (ref 80.0–100.0)
Monocytes Absolute: 0.9 10*3/uL (ref 0.1–1.0)
Monocytes Relative: 8 %
Neutro Abs: 8.7 10*3/uL — ABNORMAL HIGH (ref 1.7–7.7)
Neutrophils Relative %: 78 %
Platelets: 223 10*3/uL (ref 150–400)
RBC: 3.56 MIL/uL — ABNORMAL LOW (ref 3.87–5.11)
RDW: 16.4 % — ABNORMAL HIGH (ref 11.5–15.5)
WBC: 11 10*3/uL — ABNORMAL HIGH (ref 4.0–10.5)
nRBC: 0 % (ref 0.0–0.2)

## 2022-08-14 MED ORDER — HYDROMORPHONE HCL 1 MG/ML IJ SOLN
0.5000 mg | INTRAMUSCULAR | Status: DC | PRN
  Administered 2022-08-14 – 2022-08-22 (×45): 1 mg via INTRAVENOUS
  Filled 2022-08-14 (×47): qty 1

## 2022-08-14 MED ORDER — VERAPAMIL HCL ER 120 MG PO TBCR
120.0000 mg | EXTENDED_RELEASE_TABLET | Freq: Every day | ORAL | Status: DC
  Administered 2022-08-14: 120 mg via ORAL
  Filled 2022-08-14: qty 1

## 2022-08-14 MED ORDER — PIPERACILLIN-TAZOBACTAM 3.375 G IVPB 30 MIN
3.3750 g | Freq: Once | INTRAVENOUS | Status: AC
  Administered 2022-08-14: 3.375 g via INTRAVENOUS
  Filled 2022-08-14 (×2): qty 50

## 2022-08-14 MED ORDER — VANCOMYCIN HCL IN DEXTROSE 1-5 GM/200ML-% IV SOLN
1000.0000 mg | Freq: Once | INTRAVENOUS | Status: AC
  Administered 2022-08-14: 1000 mg via INTRAVENOUS
  Filled 2022-08-14: qty 200

## 2022-08-14 MED ORDER — VANCOMYCIN HCL 1250 MG/250ML IV SOLN: 1250.0000 mg | INTRAVENOUS | Status: DC

## 2022-08-14 MED ORDER — OXYCODONE HCL 5 MG PO TABS
10.0000 mg | ORAL_TABLET | Freq: Once | ORAL | Status: AC
  Administered 2022-08-14: 10 mg via ORAL
  Filled 2022-08-14: qty 2

## 2022-08-14 MED ORDER — VANCOMYCIN HCL IN DEXTROSE 1-5 GM/200ML-% IV SOLN
1000.0000 mg | INTRAVENOUS | Status: DC
  Administered 2022-08-15 – 2022-08-17 (×3): 1000 mg via INTRAVENOUS
  Filled 2022-08-14 (×3): qty 200

## 2022-08-14 MED ORDER — HYDROMORPHONE HCL 1 MG/ML IJ SOLN
INTRAMUSCULAR | Status: AC
  Filled 2022-08-14: qty 1

## 2022-08-14 MED ORDER — PIPERACILLIN-TAZOBACTAM 3.375 G IVPB
3.3750 g | Freq: Three times a day (TID) | INTRAVENOUS | Status: DC
  Administered 2022-08-15 – 2022-08-19 (×13): 3.375 g via INTRAVENOUS
  Filled 2022-08-14 (×14): qty 50

## 2022-08-14 MED ORDER — HYDROMORPHONE HCL 1 MG/ML IJ SOLN
0.5000 mg | Freq: Once | INTRAMUSCULAR | Status: AC
  Administered 2022-08-14: 0.5 mg via INTRAVENOUS
  Filled 2022-08-14: qty 1

## 2022-08-14 NOTE — ED Triage Notes (Addendum)
Pt to ED c/o ongoing pain to wound to right ankle. Pt is being followed for this problem outpatient. Reports orthopedic is concerned for infection/osteomyelitis  , Reports currently on ABX

## 2022-08-14 NOTE — Assessment & Plan Note (Signed)
Wound care consult for wound vac management.

## 2022-08-14 NOTE — Progress Notes (Signed)
Pharmacy Antibiotic Note  [name] is a 67 y.o. female admitted on 08/14/2022 with R ankle  wound infection .  Pharmacy has been consulted for Zosyn (piperacillin-tazobactam) and Vancomycin dosing.  WBC 11, Tmax 98.9 LA 1.3, HR 121, RR 16 Scr 0.59  Patient received Zosyn 3.375g IV x1 over 30 min, and Vancomycin 1000mg  IV x1 in the ED  Plan: Start Zosyn 3.375g IV q8h extended infusion over 4 hours Start Vancomycin 1000mg  IV q24h (eAUC ~436)    > Goal AUC 400-550    > Check vancomycin levels at steady state  Monitor daily CBC, temp, SCr, and for clinical signs of improvement  F/u cultures and de-escalate antibiotics as able   Height: 5' 1.5" (156.2 cm) Weight: 48.5 kg (107 lb) IBW/kg (Calculated) : 48.95  Temp (24hrs), Avg:98.4 F (36.9 C), Min:98.1 F (36.7 C), Max:98.9 F (37.2 C)  Recent Labs  Lab 08/14/22 1457  WBC 11.0*  CREATININE 0.59  LATICACIDVEN 1.3    Estimated Creatinine Clearance: 54.4 mL/min (by C-G formula based on SCr of 0.59 mg/dL).    No Known Allergies  Antimicrobials this admission: Zosyn 7/19 >>  Vancomycin 7/19 >>   Dose adjustments this admission: N/A  Microbiology results: 7/19 BCx x2: sent   Thank you for allowing pharmacy to be a part of this patient's care.  Wilburn Cornelia, PharmD, BCPS Clinical Pharmacist 08/14/2022 10:46 PM   Please refer to [facility] for pharmacy phone number

## 2022-08-14 NOTE — Consult Note (Signed)
Reason for Consult: Right ankle infection Referring Physician: Emergency department  [name] is an 67 y.o. female.  HPI: Patient presented to the emergency department with worsening pain, swelling and redness about her right lateral ankle.  She previously underwent open treatment of trimalleolar ankle fracture and had subsequent hardware removal 2 to delayed healing of the lateral wound.  She has been followed by plastic surgery recently for assistance with wound care.  She has had worsening pain over the last week.  It was acute onset.  She denies injury.  Due to concern for infection she presented to the emergency department for evaluation.  CT scan was ordered with concern for osteomyelitis.  She was started on empiric antibiotics.  Orthopedics was contacted  Past Medical History:  Diagnosis Date   ADHD (attention deficit hyperactivity disorder)    Anemia    Anxiety    ARDS (adult respiratory distress syndrome) (HCC) 2007   Arthritis    Depression    Gallstones 01/07/2018   Ganglion cyst of dorsum of right wrist    Headache    Hepatitis    2004 non detectable now   Hypertension    MRSA (methicillin resistant Staphylococcus aureus) 2007   Osteoporosis    Pneumonia 2009   Prosthetic eye globe    right eye    Raynaud's disease     Past Surgical History:  Procedure Laterality Date   ABDOMINAL HYSTERECTOMY     ANTERIOR CERVICAL DECOMP/DISCECTOMY FUSION N/A 12/12/2014   Procedure: ANTERIOR CERVICAL DECOMPRESSION/DISCECTOMY FUSION 3 LEVELS;  Surgeon: Estill Bamberg, MD;  Location: [facility] OR;  Service: Orthopedics;  Laterality: N/A;  Anterior cervical decompression fusion, cerivcal 5-6, cervical 6-7, cervical 7-thoracic 1 with instrumentation and allografrt   APLIGRAFT PLACEMENT Right 04/16/2022   Procedure: APPLICATION OF SKIN SUBSTITUTE GRAFT;  Surgeon: Terance Hart, MD;  Location: [facility] OR;  Service: Orthopedics;  Laterality: Right;   APPENDECTOMY  1974   APPLICATION OF  WOUND VAC Left 07/22/2022   Procedure: APPLICATION OF WOUND VAC AND MYRIAD;  Surgeon: Peggye Form, DO;  Location: [facility] OR;  Service: Plastics;  Laterality: Left;   CATARACT EXTRACTION Left 11/2018   CHOLECYSTECTOMY N/A 01/07/2018   Procedure: LAPAROSCOPIC CHOLECYSTECTOMY WITH INTRAOPERATIVE CHOLANGIOGRAM;  Surgeon: Claud Kelp, MD;  Location: [facility] OR;  Service: General;  Laterality: N/A;   COLONOSCOPY  04/14/2013   per Dr. Christella Hartigan, clear, repeat in 10 yrs    CORNEAL TRANSPLANT Right 2007   x4   ENUCLEATION Right 2013   eye infection   GANGLION CYST EXCISION Right 05/25/2016   Procedure: RIGHT WRIST ULNAR GANGLION EXCISION;  Surgeon: Mack Hook, MD;  Location: [facility];  Service: Orthopedics;  Laterality: Right;   HEMATOMA EVACUATION Left 07/22/2022   Procedure: EVACUATION HEMATOMA LEFT BACK WITH EXCISION OF WOUND;  Surgeon: Peggye Form, DO;  Location: [facility] OR;  Service: Plastics;  Laterality: Left;   I & D EXTREMITY Right 05/28/2022   Procedure: right ankle wound debridement with myriad placement;  Surgeon: Peggye Form, DO;  Location: [facility];  Service: Plastics;  Laterality: Right;   INCISION AND DRAINAGE Right 07/22/2022   Procedure: INCISION AND DRAINAGE RIGHT ANKLE;  Surgeon: Peggye Form, DO;  Location: [facility] OR;  Service: Plastics;  Laterality: Right;   MINOR HARDWARE REMOVAL Right 04/16/2022   Procedure: REPAIR OF SURGICAL WOUND DEHISCENCE RIGHT ANKLE, REMOVAL OF DEEP ORTHOPEDIC HARDWARE;  Surgeon: Terance Hart, MD;  Location: [facility] OR;  Service: Orthopedics;  Laterality: Right;   ORIF CLAVICULAR FRACTURE Left 02/03/2022   Procedure: OPEN REDUCTION INTERNAL FIXATION (ORIF) LEFT CLAVICULAR FRACTURE WITH CORACOLAVICULAR FIXATION;  Surgeon: Jones Broom, MD;  Location: [facility] OR;  Service: Orthopedics;  Laterality: Left;  BIOMET ZIP LOOP WITH CLAVICLE PLATES   ORIF FIBULA FRACTURE Right 02/03/2022   Procedure: OPEN TREATMENT  OF RIGHT PILON ANKLE FRACTURE INCLUDING LATERAL MALLEOLUS FRACTURE;  Surgeon: Terance Hart, MD;  Location: [facility] OR;  Service: Orthopedics;  Laterality: Right;  LENGTH OF SURGERY: 120 MINUTES   REVERSE SHOULDER ARTHROPLASTY Right 07/10/2021   Procedure: REVERSE SHOULDER ARTHROPLASTY;  Surgeon: Jones Broom, MD;  Location: [facility] ORS;  Service: Orthopedics;  Laterality: Right;   RHINOPLASTY  1980   ROBOTIC ASSISTED LAPAROSCOPIC SACROCOLPOPEXY Bilateral 06/13/2020   Procedure: XI ROBOTIC ASSISTED LAPAROSCOPIC SACROCOLPOPEXY AND SUPRACERVICAL HYSTERECTOMY AND BILATERAL  SALPINGO OOPHERECTOMY;  Surgeon: Crist Fat, MD;  Location: [facility] OR[facility];  Service: Urology;  Laterality: Bilateral;   SYNDESMOSIS REPAIR Right 02/03/2022   Procedure: POSSIBLE OPEN TREATMENT RIGHT SYNDESMOSIS;  Surgeon: Terance Hart, MD;  Location: [facility] OR;  Service: Orthopedics;  Laterality: Right;   TONSILLECTOMY     removed in 3rd grade   TOTAL SHOULDER ARTHROPLASTY Left 03/13/2019   Procedure: TOTAL SHOULDER ARTHROPLASTY;  Surgeon: Jones Broom, MD;  Location: [facility];  Service: Orthopedics;  Laterality: Left;    Family History  Problem Relation Age of Onset   Breast cancer Maternal Aunt 52   Colon cancer Neg Hx     Social History:  reports that she has never smoked. She has never used smokeless tobacco. She reports that she does not currently use alcohol. She reports that she does not use drugs.  Allergies: No Known Allergies  Medications: I have reviewed the patient's current medications.  Results for orders placed or performed during the hospital encounter of 08/14/22 (from the past 48 hour(s))  Comprehensive metabolic panel     Status: Abnormal   Collection Time: 08/14/22  2:57 PM  Result Value Ref Range   Sodium 131 (L) 135 - 145 mmol/L   Potassium 3.9 3.5 - 5.1 mmol/L   Chloride 97 (L) 98 - 111 mmol/L   CO2 23 22 - 32 mmol/L   Glucose, Bld 115 (H) 70 - 99 mg/dL    Comment:  Glucose reference range applies only to samples taken after fasting for at least 8 hours.   BUN 9 8 - 23 mg/dL   Creatinine, Ser 1.32 0.44 - 1.00 mg/dL   Calcium 8.5 (L) 8.9 - 10.3 mg/dL   Total Protein 7.5 6.5 - 8.1 g/dL   Albumin 2.9 (L) 3.5 - 5.0 g/dL   AST 22 15 - 41 U/L   ALT 11 0 - 44 U/L   Alkaline Phosphatase 93 38 - 126 U/L   Total Bilirubin 0.5 0.3 - 1.2 mg/dL   GFR, Estimated >44 >01 mL/min    Comment: (NOTE) Calculated using the CKD-EPI Creatinine Equation (2021)    Anion gap 11 5 - 15    Comment: Performed at [facility], [address] [place], [place] [address]  CBC with Differential     Status: Abnormal   Collection Time: 08/14/22  2:57 PM  Result Value Ref Range   WBC 11.0 (H) 4.0 - 10.5 K/uL   RBC 3.56 (L) 3.87 - 5.11 MIL/uL   Hemoglobin 10.5 (L) 12.0 - 15.0 g/dL   HCT 36.6 (L) 44.0 - 34.7 %   MCV 93.5 80.0 -  100.0 fL   MCH 29.5 26.0 - 34.0 pg   MCHC 31.5 30.0 - 36.0 g/dL   RDW 69.6 (H) 29.5 - 28.4 %   Platelets 223 150 - 400 K/uL   nRBC 0.0 0.0 - 0.2 %   Neutrophils Relative % 78 %   Neutro Abs 8.7 (H) 1.7 - 7.7 K/uL   Lymphocytes Relative 11 %   Lymphs Abs 1.2 0.7 - 4.0 K/uL   Monocytes Relative 8 %   Monocytes Absolute 0.9 0.1 - 1.0 K/uL   Eosinophils Relative 1 %   Eosinophils Absolute 0.1 0.0 - 0.5 K/uL   Basophils Relative 0 %   Basophils Absolute 0.0 0.0 - 0.1 K/uL   Immature Granulocytes 2 %   Abs Immature Granulocytes 0.18 (H) 0.00 - 0.07 K/uL    Comment: Performed at [facility], [address] [place], [place] [address]  Lactic acid, plasma     Status: None   Collection Time: 08/14/22  2:57 PM  Result Value Ref Range   Lactic Acid, Venous 1.3 0.5 - 1.9 mmol/L    Comment: Performed at [facility], [address], [place], [place] [address]    CT Foot Right Wo Contrast  Result Date: 08/14/2022 CLINICAL DATA:  Osteomyelitis suspected. History of right ankle pilon fracture EXAM: CT OF THE RIGHT FOOT WITHOUT CONTRAST  TECHNIQUE: Multidetector CT imaging of the right foot was performed according to the standard protocol. Multiplanar CT image reconstructions were also generated. RADIATION DOSE REDUCTION: This exam was performed according to the departmental dose-optimization program which includes automated exposure control, adjustment of the mA and/or kV according to patient size and/or use of iterative reconstruction technique. COMPARISON:  Right ankle radiographs 02/03/2022. CT of the right ankle 02/02/2022. FINDINGS: Bones/Joint/Cartilage There are postsurgical changes in the distal tibia status post posterior plate and screw fixation of the comminuted intra-articular fracture. There is an additional cortical screw within the medial malleolus. The fracture of the distal tibia has healed with mild residual posttraumatic deformity extending to the articular surface. Previously demonstrated lateral fibular plate and screws have been removed in the interval. Oblique fracture of the distal fibula shows incomplete osseous healing, suspicious for nonunion. Focal osteolysis of the lateral malleolus with cortical destruction, best seen on sagittal image 13/8, suspicious for osteomyelitis. No evidence for osteomyelitis within the foot. The bones are diffusely demineralized. There is no evidence of acute fracture or dislocation. There is a small ankle joint effusion. Ligaments Suboptimally assessed by CT. Muscles and Tendons The peroneal tendons are poorly defined at the level of the distal fibula and appear laterally subluxed from the retromalleolar groove. Cannot confirm tendon integrity. The Achilles, medial flexor and anterior extensor tendons appear intact. No focal musculotendinous abnormalities identified in the foot. Soft tissues Lateral soft tissue swelling at the ankle with soft tissue ulceration peripheral to the lateral malleolus suspicious for soft tissue infection. No focal fluid collection, soft tissue emphysema or  unexpected foreign body identified. Nonspecific subcutaneous edema in the dorsal aspect of the foot. IMPRESSION: 1. Lateral soft tissue swelling at the ankle with soft tissue ulceration peripheral to the lateral malleolus, suspicious for soft tissue infection. No focal fluid collection, soft tissue emphysema or unexpected foreign body identified. 2. Focal osteolysis of the adjacent lateral malleolus with cortical destruction, suspicious for osteomyelitis. 3. Previously demonstrated lateral fibular plate and screws have been removed. Incomplete osseous healing of the oblique fracture of the distal fibula, suspicious for nonunion. 4. No evidence  of osteomyelitis within the foot. Correlate with additional areas of concern. 5. The peroneal tendons are poorly defined at the level of the distal fibula and appear laterally subluxed from the retromalleolar groove. Cannot confirm tendon integrity. Electronically Signed   By: Carey Bullocks M.D.   On: 08/14/2022 16:15    Review of Systems  Constitutional: Negative.   Eyes: Negative.   Respiratory: Negative.    Cardiovascular: Negative.   Gastrointestinal: Negative.   Musculoskeletal:  Positive for arthralgias.  Skin:  Positive for wound.  All other systems reviewed and are negative.  Blood pressure 126/74, pulse 92, temperature 98.9 F (37.2 C), temperature source Oral, resp. rate 12, height 5' 1.5" (1.562 m), weight 48.5 kg, SpO2 98%. Physical Exam HENT:     Head: Normocephalic.     Mouth/Throat:     Mouth: Mucous membranes are moist.  Eyes:     Extraocular Movements: Extraocular movements intact.  Cardiovascular:     Rate and Rhythm: Normal rate.  Pulmonary:     Effort: Pulmonary effort is normal.  Musculoskeletal:        General: Swelling and tenderness present.     Cervical back: Neck supple.  Skin:    Comments: Right ankle with evidence of prior surgery to the lateral ankle.  There is erythema and drainage from a wound on the lateral ankle  at the site of prior surgery.  Neurological:     General: No focal deficit present.     Mental Status: She is alert.  Psychiatric:        Mood and Affect: Mood normal.     Assessment/Plan: Patient has developed an infection along the lateral aspect of her right ankle at the prior surgery site.  The plate had been removed previously due to slow wound healing however she does have a posterior tibial plate in place.  CT scan is concerning for osteomyelitis.  Her skin at this point in the area of infection is not amenable for surgery and therefore would recommend admission to the hospitalist service for empiric IV antibiotic treatment.  I am hopeful that she will have some response to the antibiotics.  If she does not have curative response with the antibiotic she may require deep orthopedic hardware removal with saucerization type procedure.  We did discuss that her wound is limb threatening and she understands.  It also may be of benefit to the patient to notify plastic surgery of her admission and wound issues.  We will follow and update plan as we see her response to the antibiotic therapy.  Terance Hart 08/14/2022, 10:59 PM

## 2022-08-14 NOTE — Assessment & Plan Note (Signed)
Cont home Verapamil Add PRN if needed

## 2022-08-14 NOTE — ED Provider Notes (Incomplete)
[place] EMERGENCY DEPARTMENT AT [facility] Provider Note   CSN: [id number] Arrival date & time: 08/14/22  1413     History {Add pertinent medical, surgical, social history, OB history to HPI:1} Chief Complaint  Patient presents with  . Wound Infection    Right ankle    [name] is a 67 y.o. female with past medical history significant for arthritis, anemia, ADHD, MRSA, osteoporosis, hypertension, hepatitis, anxiety presents to the ED with concerns of increased pain and worsening wound to right ankle.  Patient states that she spoke with Dr. Susa Simmonds earlier today who is concerned for osteomyelitis of worsening infection and recommend she come to the ED for IV antibiotics and imaging.  Patient states she is currently on doxycycline.  Patient is also followed by wound care for a large wound on her back with wound VAC and her right ankle wound.  Denies fever, chills, myalgias.       Home Medications Prior to Admission medications   Medication Sig Start Date End Date Taking? Authorizing Provider  Acetaminophen Extra Strength 500 MG TABS Take 1,000 mg by mouth as needed (pain). 06/16/22   [provider]  amphetamine-dextroamphetamine (ADDERALL XR) 20 MG 24 hr capsule Take 1 capsule (20 mg total) by mouth every morning. 08/04/22   Nelwyn Salisbury, MD  amphetamine-dextroamphetamine (ADDERALL XR) 20 MG 24 hr capsule Take 1 capsule (20 mg total) by mouth every morning. 08/04/22   Nelwyn Salisbury, MD  amphetamine-dextroamphetamine (ADDERALL XR) 20 MG 24 hr capsule Take 1 capsule (20 mg total) by mouth every morning. 08/04/22   Nelwyn Salisbury, MD  celecoxib (CELEBREX) 200 MG capsule Take 1 capsule (200 mg total) by mouth 2 (two) times daily. 02/23/22   Nelwyn Salisbury, MD  cycloSPORINE (RESTASIS) 0.05 % ophthalmic emulsion Place 1 drop into both eyes 2 times daily. Patient taking differently: Place 2 drops into both eyes 2 (two) times daily. 06/25/21     diazepam (VALIUM) 5  MG tablet Take 1 tablet (5 mg total) by mouth every 8 (eight) hours as needed for anxiety. 05/27/22   Nelwyn Salisbury, MD  doxycycline (VIBRA-TABS) 100 MG tablet Take 1 tablet (100 mg total) by mouth 2 (two) times daily for 7 days. 08/11/22 08/19/22  Lorelee New, PA-C  estradiol (ESTRACE VAGINAL) 0.1 MG/GM vaginal cream Apply a fingertip amount to urethra 2 times per week. Patient taking differently: Place 1 Applicatorful vaginally once a week. 09/30/21     HYDROmorphone (DILAUDID) 8 MG tablet Take 1 tablet (8 mg total) by mouth every 8 (eight) hours as needed for severe pain. Patient taking differently: Take 8 mg by mouth daily as needed for severe pain. 07/09/22   Nelwyn Salisbury, MD  nystatin (MYCOSTATIN) 100000 UNIT/ML suspension Take 5 mLs (500,000 Units total) by mouth 4 (four) times daily. Patient taking differently: Take 5 mLs by mouth as needed (thrush). 05/27/22   Nelwyn Salisbury, MD  Oxycodone HCl 20 MG TABS Take 1 tablet (20 mg total) by mouth every 4 (four) hours as needed (pain). Patient taking differently: Take 20 mg by mouth 2 (two) times daily as needed (pain). 07/09/22   Nelwyn Salisbury, MD  polyethylene glycol powder (GLYCOLAX/MIRALAX) powder TAKE 17 GRAMS BY MOUTH 2 TIMES DAILY AS NEEDED. Patient taking differently: Take 17 g by mouth as needed for moderate constipation. 02/02/17   Nelwyn Salisbury, MD  pregabalin (LYRICA) 300 MG capsule Take 1 capsule (300 mg total) by  mouth 2 (two) times daily. 07/27/22   Nelwyn Salisbury, MD  pregabalin (LYRICA) 300 MG capsule Take 1 capsule (300 mg total) by mouth 2 (two) times daily. 08/07/22   Nelwyn Salisbury, MD  tiZANidine (ZANAFLEX) 2 MG tablet Take 1 tablet by mouth every 6 to 8 hours as needed for muscle spasms 04/17/22   [place], Jesse J, PA-C  valACYclovir (VALTREX) 1000 MG tablet Take 1 tablet by mouth once daily. 05/07/21     valACYclovir (VALTREX) 1000 MG tablet Take 1 tablet (1,000 mg total) by mouth 3 (three) times daily. 08/03/22   Nelwyn Salisbury, MD   verapamil (CALAN-SR) 120 MG CR tablet Take 1 tablet (120 mg total) by mouth at bedtime. 04/27/22   Nelwyn Salisbury, MD      Allergies    Patient has no known allergies.    Review of Systems   Review of Systems  Constitutional:  Negative for chills and fever.  Musculoskeletal:  Positive for arthralgias (R ankle pain) and joint swelling (R ankle). Negative for myalgias.  Skin:  Positive for wound (R ankle).    Physical Exam Updated Vital Signs BP 126/74   Pulse 92   Temp 98.1 F (36.7 C) (Oral)   Resp 12   Ht 5' 1.5" (1.562 m)   Wt 48.5 kg   SpO2 98%   BMI 19.89 kg/m  Physical Exam Vitals and nursing note reviewed.  Constitutional:      General: She is not in acute distress.    Appearance: Normal appearance. She is not ill-appearing or diaphoretic.  Cardiovascular:     Rate and Rhythm: Normal rate and regular rhythm.     Pulses: Normal pulses.          Dorsalis pedis pulses are 2+ on the right side and 2+ on the left side.  Pulmonary:     Effort: Pulmonary effort is normal.  Neurological:     Mental Status: She is alert. Mental status is at baseline.  Psychiatric:        Mood and Affect: Mood normal.        Behavior: Behavior normal.           ED Results / Procedures / Treatments   Labs (all labs ordered are listed, but only abnormal results are displayed) Labs Reviewed  COMPREHENSIVE METABOLIC PANEL - Abnormal; Notable for the following components:      Result Value   Sodium 131 (*)    Chloride 97 (*)    Glucose, Bld 115 (*)    Calcium 8.5 (*)    Albumin 2.9 (*)    All other components within normal limits  CBC WITH DIFFERENTIAL/PLATELET - Abnormal; Notable for the following components:   WBC 11.0 (*)    RBC 3.56 (*)    Hemoglobin 10.5 (*)    HCT 33.3 (*)    RDW 16.4 (*)    Neutro Abs 8.7 (*)    Abs Immature Granulocytes 0.18 (*)    All other components within normal limits  CULTURE, BLOOD (ROUTINE X 2)  CULTURE, BLOOD (ROUTINE X 2)  LACTIC ACID,  PLASMA  LACTIC ACID, PLASMA    EKG None  Radiology CT Foot Right Wo Contrast  Result Date: 08/14/2022 CLINICAL DATA:  Osteomyelitis suspected. History of right ankle pilon fracture EXAM: CT OF THE RIGHT FOOT WITHOUT CONTRAST TECHNIQUE: Multidetector CT imaging of the right foot was performed according to the standard protocol. Multiplanar CT image reconstructions were also generated. RADIATION DOSE  REDUCTION: This exam was performed according to the departmental dose-optimization program which includes automated exposure control, adjustment of the mA and/or kV according to patient size and/or use of iterative reconstruction technique. COMPARISON:  Right ankle radiographs 02/03/2022. CT of the right ankle 02/02/2022. FINDINGS: Bones/Joint/Cartilage There are postsurgical changes in the distal tibia status post posterior plate and screw fixation of the comminuted intra-articular fracture. There is an additional cortical screw within the medial malleolus. The fracture of the distal tibia has healed with mild residual posttraumatic deformity extending to the articular surface. Previously demonstrated lateral fibular plate and screws have been removed in the interval. Oblique fracture of the distal fibula shows incomplete osseous healing, suspicious for nonunion. Focal osteolysis of the lateral malleolus with cortical destruction, best seen on sagittal image 13/8, suspicious for osteomyelitis. No evidence for osteomyelitis within the foot. The bones are diffusely demineralized. There is no evidence of acute fracture or dislocation. There is a small ankle joint effusion. Ligaments Suboptimally assessed by CT. Muscles and Tendons The peroneal tendons are poorly defined at the level of the distal fibula and appear laterally subluxed from the retromalleolar groove. Cannot confirm tendon integrity. The Achilles, medial flexor and anterior extensor tendons appear intact. No focal musculotendinous abnormalities  identified in the foot. Soft tissues Lateral soft tissue swelling at the ankle with soft tissue ulceration peripheral to the lateral malleolus suspicious for soft tissue infection. No focal fluid collection, soft tissue emphysema or unexpected foreign body identified. Nonspecific subcutaneous edema in the dorsal aspect of the foot. IMPRESSION: 1. Lateral soft tissue swelling at the ankle with soft tissue ulceration peripheral to the lateral malleolus, suspicious for soft tissue infection. No focal fluid collection, soft tissue emphysema or unexpected foreign body identified. 2. Focal osteolysis of the adjacent lateral malleolus with cortical destruction, suspicious for osteomyelitis. 3. Previously demonstrated lateral fibular plate and screws have been removed. Incomplete osseous healing of the oblique fracture of the distal fibula, suspicious for nonunion. 4. No evidence of osteomyelitis within the foot. Correlate with additional areas of concern. 5. The peroneal tendons are poorly defined at the level of the distal fibula and appear laterally subluxed from the retromalleolar groove. Cannot confirm tendon integrity. Electronically Signed   By: Carey Bullocks M.D.   On: 08/14/2022 16:15    Procedures Procedures  {Document cardiac monitor, telemetry assessment procedure when appropriate:1}  Medications Ordered in ED Medications  vancomycin (VANCOCIN) IVPB 1000 mg/200 mL premix (has no administration in time range)  piperacillin-tazobactam (ZOSYN) IVPB 3.375 g (3.375 g Intravenous New Bag/Given 08/14/22 2037)  HYDROmorphone (DILAUDID) injection 0.5 mg (0.5 mg Intravenous Given 08/14/22 2029)    ED Course/ Medical Decision Making/ A&P Clinical Course as of 08/14/22 2236  Fri Aug 14, 2022  2104 Osteomyelitis of the ankle.  Sent in by orthopedic surgery for intervention.  Admitting to medicine. [CC]    Clinical Course User Index [CC] Glyn Ade, MD   {   Click here for ABCD2, HEART and other  calculatorsREFRESH Note before signing :1}                          Medical Decision Making Risk Prescription drug management.   ***  {Document critical care time when appropriate:1} {Document review of labs and clinical decision tools ie heart score, Chads2Vasc2 etc:1}  {Document your independent review of radiology images, and any outside records:1} {Document your discussion with family members, caretakers, and with consultants:1} {Document social determinants of health  affecting pt's care:1} {Document your decision making why or why not admission, treatments were needed:1} Final Clinical Impression(s) / ED Diagnoses Final diagnoses:  None    Rx / DC Orders ED Discharge Orders     None

## 2022-08-14 NOTE — Assessment & Plan Note (Addendum)
Confirmed on CT scan Empiric zosyn / vanc EDP d/w Dr. Susa Simmonds (who also did surg on pt in Jan): Admit for ABx No surg this weekend But will need surg next week to remove remaining hardware No need for MRI (osteo seen on CT) Ordering wound culture BCx pending Dilaudid PRN pain for the moment.

## 2022-08-14 NOTE — ED Provider Notes (Signed)
[facility] AT [facility] Provider Note   CSN: [id number] Arrival date & time: 08/14/22  1413     History {Add pertinent medical, surgical, social history, OB history to HPI:1} Chief Complaint  Patient presents with   Wound Infection    Right ankle    [name] is a 67 y.o. female.  HPI     Home Medications Prior to Admission medications   Medication Sig Start Date End Date Taking? Authorizing Provider  Acetaminophen Extra Strength 500 MG TABS Take 1,000 mg by mouth as needed (pain). 06/16/22   [provider]  amphetamine-dextroamphetamine (ADDERALL XR) 20 MG 24 hr capsule Take 1 capsule (20 mg total) by mouth every morning. 08/04/22   Nelwyn Salisbury, MD  amphetamine-dextroamphetamine (ADDERALL XR) 20 MG 24 hr capsule Take 1 capsule (20 mg total) by mouth every morning. 08/04/22   Nelwyn Salisbury, MD  amphetamine-dextroamphetamine (ADDERALL XR) 20 MG 24 hr capsule Take 1 capsule (20 mg total) by mouth every morning. 08/04/22   Nelwyn Salisbury, MD  celecoxib (CELEBREX) 200 MG capsule Take 1 capsule (200 mg total) by mouth 2 (two) times daily. 02/23/22   Nelwyn Salisbury, MD  cycloSPORINE (RESTASIS) 0.05 % ophthalmic emulsion Place 1 drop into both eyes 2 times daily. Patient taking differently: Place 2 drops into both eyes 2 (two) times daily. 06/25/21     diazepam (VALIUM) 5 MG tablet Take 1 tablet (5 mg total) by mouth every 8 (eight) hours as needed for anxiety. 05/27/22   Nelwyn Salisbury, MD  doxycycline (VIBRA-TABS) 100 MG tablet Take 1 tablet (100 mg total) by mouth 2 (two) times daily for 7 days. 08/11/22 08/19/22  Lorelee New, PA-C  estradiol (ESTRACE VAGINAL) 0.1 MG/GM vaginal cream Apply a fingertip amount to urethra 2 times per week. Patient taking differently: Place 1 Applicatorful vaginally once a week. 09/30/21     HYDROmorphone (DILAUDID) 8 MG tablet Take 1 tablet (8 mg total) by mouth every 8 (eight) hours as needed for severe  pain. Patient taking differently: Take 8 mg by mouth daily as needed for severe pain. 07/09/22   Nelwyn Salisbury, MD  nystatin (MYCOSTATIN) 100000 UNIT/ML suspension Take 5 mLs (500,000 Units total) by mouth 4 (four) times daily. Patient taking differently: Take 5 mLs by mouth as needed (thrush). 05/27/22   Nelwyn Salisbury, MD  Oxycodone HCl 20 MG TABS Take 1 tablet (20 mg total) by mouth every 4 (four) hours as needed (pain). Patient taking differently: Take 20 mg by mouth 2 (two) times daily as needed (pain). 07/09/22   Nelwyn Salisbury, MD  polyethylene glycol powder (GLYCOLAX/MIRALAX) powder TAKE 17 GRAMS BY MOUTH 2 TIMES DAILY AS NEEDED. Patient taking differently: Take 17 g by mouth as needed for moderate constipation. 02/02/17   Nelwyn Salisbury, MD  pregabalin (LYRICA) 300 MG capsule Take 1 capsule (300 mg total) by mouth 2 (two) times daily. 07/27/22   Nelwyn Salisbury, MD  pregabalin (LYRICA) 300 MG capsule Take 1 capsule (300 mg total) by mouth 2 (two) times daily. 08/07/22   Nelwyn Salisbury, MD  tiZANidine (ZANAFLEX) 2 MG tablet Take 1 tablet by mouth every 6 to 8 hours as needed for muscle spasms 04/17/22   [place], Jesse J, PA-C  valACYclovir (VALTREX) 1000 MG tablet Take 1 tablet by mouth once daily. 05/07/21     valACYclovir (VALTREX) 1000 MG tablet Take 1 tablet (1,000 mg total) by mouth  3 (three) times daily. 08/03/22   Nelwyn Salisbury, MD  verapamil (CALAN-SR) 120 MG CR tablet Take 1 tablet (120 mg total) by mouth at bedtime. 04/27/22   Nelwyn Salisbury, MD      Allergies    Patient has no known allergies.    Review of Systems   Review of Systems  Physical Exam Updated Vital Signs BP 126/74   Pulse 92   Temp 98.1 F (36.7 C) (Oral)   Resp 12   Ht 5' 1.5" (1.562 m)   Wt 48.5 kg   SpO2 98%   BMI 19.89 kg/m  Physical Exam        ED Results / Procedures / Treatments   Labs (all labs ordered are listed, but only abnormal results are displayed) Labs Reviewed  COMPREHENSIVE METABOLIC  PANEL - Abnormal; Notable for the following components:      Result Value   Sodium 131 (*)    Chloride 97 (*)    Glucose, Bld 115 (*)    Calcium 8.5 (*)    Albumin 2.9 (*)    All other components within normal limits  CBC WITH DIFFERENTIAL/PLATELET - Abnormal; Notable for the following components:   WBC 11.0 (*)    RBC 3.56 (*)    Hemoglobin 10.5 (*)    HCT 33.3 (*)    RDW 16.4 (*)    Neutro Abs 8.7 (*)    Abs Immature Granulocytes 0.18 (*)    All other components within normal limits  CULTURE, BLOOD (ROUTINE X 2)  CULTURE, BLOOD (ROUTINE X 2)  LACTIC ACID, PLASMA  LACTIC ACID, PLASMA    EKG None  Radiology CT Foot Right Wo Contrast  Result Date: 08/14/2022 CLINICAL DATA:  Osteomyelitis suspected. History of right ankle pilon fracture EXAM: CT OF THE RIGHT FOOT WITHOUT CONTRAST TECHNIQUE: Multidetector CT imaging of the right foot was performed according to the standard protocol. Multiplanar CT image reconstructions were also generated. RADIATION DOSE REDUCTION: This exam was performed according to the departmental dose-optimization program which includes automated exposure control, adjustment of the mA and/or kV according to patient size and/or use of iterative reconstruction technique. COMPARISON:  Right ankle radiographs 02/03/2022. CT of the right ankle 02/02/2022. FINDINGS: Bones/Joint/Cartilage There are postsurgical changes in the distal tibia status post posterior plate and screw fixation of the comminuted intra-articular fracture. There is an additional cortical screw within the medial malleolus. The fracture of the distal tibia has healed with mild residual posttraumatic deformity extending to the articular surface. Previously demonstrated lateral fibular plate and screws have been removed in the interval. Oblique fracture of the distal fibula shows incomplete osseous healing, suspicious for nonunion. Focal osteolysis of the lateral malleolus with cortical destruction, best  seen on sagittal image 13/8, suspicious for osteomyelitis. No evidence for osteomyelitis within the foot. The bones are diffusely demineralized. There is no evidence of acute fracture or dislocation. There is a small ankle joint effusion. Ligaments Suboptimally assessed by CT. Muscles and Tendons The peroneal tendons are poorly defined at the level of the distal fibula and appear laterally subluxed from the retromalleolar groove. Cannot confirm tendon integrity. The Achilles, medial flexor and anterior extensor tendons appear intact. No focal musculotendinous abnormalities identified in the foot. Soft tissues Lateral soft tissue swelling at the ankle with soft tissue ulceration peripheral to the lateral malleolus suspicious for soft tissue infection. No focal fluid collection, soft tissue emphysema or unexpected foreign body identified. Nonspecific subcutaneous edema in the dorsal aspect of the  foot. IMPRESSION: 1. Lateral soft tissue swelling at the ankle with soft tissue ulceration peripheral to the lateral malleolus, suspicious for soft tissue infection. No focal fluid collection, soft tissue emphysema or unexpected foreign body identified. 2. Focal osteolysis of the adjacent lateral malleolus with cortical destruction, suspicious for osteomyelitis. 3. Previously demonstrated lateral fibular plate and screws have been removed. Incomplete osseous healing of the oblique fracture of the distal fibula, suspicious for nonunion. 4. No evidence of osteomyelitis within the foot. Correlate with additional areas of concern. 5. The peroneal tendons are poorly defined at the level of the distal fibula and appear laterally subluxed from the retromalleolar groove. Cannot confirm tendon integrity. Electronically Signed   By: Carey Bullocks M.D.   On: 08/14/2022 16:15    Procedures Procedures  {Document cardiac monitor, telemetry assessment procedure when appropriate:1}  Medications Ordered in ED Medications  vancomycin  (VANCOCIN) IVPB 1000 mg/200 mL premix (has no administration in time range)  piperacillin-tazobactam (ZOSYN) IVPB 3.375 g (3.375 g Intravenous New Bag/Given 08/14/22 2037)  HYDROmorphone (DILAUDID) injection 0.5 mg (0.5 mg Intravenous Given 08/14/22 2029)    ED Course/ Medical Decision Making/ A&P   {   Click here for ABCD2, HEART and other calculatorsREFRESH Note before signing :1}                          Medical Decision Making Risk Prescription drug management.   ***  {Document critical care time when appropriate:1} {Document review of labs and clinical decision tools ie heart score, Chads2Vasc2 etc:1}  {Document your independent review of radiology images, and any outside records:1} {Document your discussion with family members, caretakers, and with consultants:1} {Document social determinants of health affecting pt's care:1} {Document your decision making why or why not admission, treatments were needed:1} Final Clinical Impression(s) / ED Diagnoses Final diagnoses:  None    Rx / DC Orders ED Discharge Orders     None

## 2022-08-14 NOTE — ED Provider Triage Note (Signed)
Emergency Medicine Provider Triage Evaluation Note  Vanisha Whiten , a 67 y.o. female  was evaluated in triage.  Pt complains of wound infection.  Review of Systems  Positive:  Negative:   Physical Exam  BP 122/86 (BP Location: Right Arm)   Pulse (!) 121   Temp 98.3 F (36.8 C) (Oral)   Resp 16   Ht 5' 1.5" (1.562 m)   Wt 48.5 kg   SpO2 93%   BMI 19.89 kg/m  Gen:   Awake, no distress   Resp:  Normal effort  MSK:   Moves extremities without difficulty  Other:    Medical Decision Making  Medically screening exam initiated at 2:43 PM.  Appropriate orders placed.  Caeleigh Prohaska Frech was informed that the remainder of the evaluation will be completed by another provider, this initial triage assessment does not replace that evaluation, and the importance of remaining in the ED until their evaluation is complete.  Complaining of wound infection and right ankle pain. Coming from orthopedics who was concerned for osteomyelitis. Provider wants patient to receive imaging and IV ABX. Patient reports xray was taken today but I am not seeing the reports of xray at this time.  Patient broke ankle around christmas 2023. Had surgery and was placed in a boot. Boot caused skin erosion and plate was exposed. Surgeons took plate out and referred patient to plastic surgery. Wound was healing until this last Sunday when ankle became painful with ambulation. Has been put on Doxycycline since Tuesday. Orthopedics saw wound today and referred to ED.  Denies fever, chest pain, cough, dyspnea, abdominal pain, nausea, vomiting.   Dorthy Cooler, [place] 08/14/22 1451

## 2022-08-15 DIAGNOSIS — E871 Hypo-osmolality and hyponatremia: Secondary | ICD-10-CM | POA: Diagnosis not present

## 2022-08-15 DIAGNOSIS — L03115 Cellulitis of right lower limb: Secondary | ICD-10-CM | POA: Diagnosis present

## 2022-08-15 DIAGNOSIS — D509 Iron deficiency anemia, unspecified: Secondary | ICD-10-CM | POA: Diagnosis not present

## 2022-08-15 DIAGNOSIS — Z96611 Presence of right artificial shoulder joint: Secondary | ICD-10-CM | POA: Diagnosis present

## 2022-08-15 DIAGNOSIS — Z981 Arthrodesis status: Secondary | ICD-10-CM | POA: Diagnosis not present

## 2022-08-15 DIAGNOSIS — M86171 Other acute osteomyelitis, right ankle and foot: Secondary | ICD-10-CM | POA: Diagnosis not present

## 2022-08-15 DIAGNOSIS — I10 Essential (primary) hypertension: Secondary | ICD-10-CM | POA: Diagnosis not present

## 2022-08-15 DIAGNOSIS — S31000D Unspecified open wound of lower back and pelvis without penetration into retroperitoneum, subsequent encounter: Secondary | ICD-10-CM | POA: Diagnosis not present

## 2022-08-15 DIAGNOSIS — Z79899 Other long term (current) drug therapy: Secondary | ICD-10-CM | POA: Diagnosis not present

## 2022-08-15 DIAGNOSIS — Z96612 Presence of left artificial shoulder joint: Secondary | ICD-10-CM | POA: Diagnosis present

## 2022-08-15 DIAGNOSIS — F418 Other specified anxiety disorders: Secondary | ICD-10-CM | POA: Diagnosis not present

## 2022-08-15 DIAGNOSIS — J15212 Pneumonia due to Methicillin resistant Staphylococcus aureus: Secondary | ICD-10-CM | POA: Diagnosis not present

## 2022-08-15 DIAGNOSIS — Z79818 Long term (current) use of other agents affecting estrogen receptors and estrogen levels: Secondary | ICD-10-CM | POA: Diagnosis not present

## 2022-08-15 DIAGNOSIS — E876 Hypokalemia: Secondary | ICD-10-CM | POA: Diagnosis not present

## 2022-08-15 DIAGNOSIS — Z970 Presence of artificial eye: Secondary | ICD-10-CM | POA: Diagnosis not present

## 2022-08-15 DIAGNOSIS — S86319A Strain of muscle(s) and tendon(s) of peroneal muscle group at lower leg level, unspecified leg, initial encounter: Secondary | ICD-10-CM | POA: Diagnosis not present

## 2022-08-15 DIAGNOSIS — T8189XA Other complications of procedures, not elsewhere classified, initial encounter: Secondary | ICD-10-CM | POA: Diagnosis not present

## 2022-08-15 DIAGNOSIS — M869 Osteomyelitis, unspecified: Secondary | ICD-10-CM | POA: Diagnosis not present

## 2022-08-15 DIAGNOSIS — Z8614 Personal history of Methicillin resistant Staphylococcus aureus infection: Secondary | ICD-10-CM | POA: Diagnosis not present

## 2022-08-15 DIAGNOSIS — G894 Chronic pain syndrome: Secondary | ICD-10-CM | POA: Diagnosis not present

## 2022-08-15 DIAGNOSIS — M810 Age-related osteoporosis without current pathological fracture: Secondary | ICD-10-CM | POA: Diagnosis present

## 2022-08-15 DIAGNOSIS — F900 Attention-deficit hyperactivity disorder, predominantly inattentive type: Secondary | ICD-10-CM | POA: Diagnosis not present

## 2022-08-15 DIAGNOSIS — Z803 Family history of malignant neoplasm of breast: Secondary | ICD-10-CM | POA: Diagnosis not present

## 2022-08-15 DIAGNOSIS — M868X6 Other osteomyelitis, lower leg: Secondary | ICD-10-CM | POA: Diagnosis not present

## 2022-08-15 DIAGNOSIS — I7300 Raynaud's syndrome without gangrene: Secondary | ICD-10-CM | POA: Diagnosis present

## 2022-08-15 DIAGNOSIS — Z472 Encounter for removal of internal fixation device: Secondary | ICD-10-CM | POA: Diagnosis not present

## 2022-08-15 DIAGNOSIS — Z791 Long term (current) use of non-steroidal anti-inflammatories (NSAID): Secondary | ICD-10-CM | POA: Diagnosis not present

## 2022-08-15 DIAGNOSIS — T8149XA Infection following a procedure, other surgical site, initial encounter: Secondary | ICD-10-CM | POA: Diagnosis not present

## 2022-08-15 DIAGNOSIS — E8809 Other disorders of plasma-protein metabolism, not elsewhere classified: Secondary | ICD-10-CM | POA: Diagnosis not present

## 2022-08-15 DIAGNOSIS — L97319 Non-pressure chronic ulcer of right ankle with unspecified severity: Secondary | ICD-10-CM | POA: Diagnosis not present

## 2022-08-15 LAB — GLUCOSE, CAPILLARY
Glucose-Capillary: 122 mg/dL — ABNORMAL HIGH (ref 70–99)
Glucose-Capillary: 89 mg/dL (ref 70–99)

## 2022-08-15 LAB — BASIC METABOLIC PANEL
Anion gap: 10 (ref 5–15)
BUN: 8 mg/dL (ref 8–23)
CO2: 28 mmol/L (ref 22–32)
Calcium: 8.5 mg/dL — ABNORMAL LOW (ref 8.9–10.3)
Chloride: 95 mmol/L — ABNORMAL LOW (ref 98–111)
Creatinine, Ser: 0.51 mg/dL (ref 0.44–1.00)
GFR, Estimated: >60 mL/min (ref >60)
Glucose, Bld: 98 mg/dL (ref 70–99)
Potassium: 3.5 mmol/L (ref 3.5–5.1)
Sodium: 133 mmol/L — ABNORMAL LOW (ref 135–145)

## 2022-08-15 LAB — CBC
HCT: 32.7 % — ABNORMAL LOW (ref 36.0–46.0)
Hemoglobin: 10.2 g/dL — ABNORMAL LOW (ref 12.0–15.0)
MCH: 29.2 pg (ref 26.0–34.0)
MCHC: 31.2 g/dL (ref 30.0–36.0)
MCV: 93.7 fL (ref 80.0–100.0)
Platelets: 202 10*3/uL (ref 150–400)
RBC: 3.49 MIL/uL — ABNORMAL LOW (ref 3.87–5.11)
RDW: 16.6 % — ABNORMAL HIGH (ref 11.5–15.5)
WBC: 7 10*3/uL (ref 4.0–10.5)
nRBC: 0 % (ref 0.0–0.2)

## 2022-08-15 LAB — LACTIC ACID, PLASMA: Lactic Acid, Venous: 0.7 mmol/L (ref 0.5–1.9)

## 2022-08-15 LAB — CULTURE, BLOOD (ROUTINE X 2)

## 2022-08-15 LAB — AEROBIC CULTURE W GRAM STAIN (SUPERFICIAL SPECIMEN)

## 2022-08-15 MED ORDER — PREGABALIN 100 MG PO CAPS
300.0000 mg | ORAL_CAPSULE | Freq: Two times a day (BID) | ORAL | Status: DC
  Administered 2022-08-15 – 2022-08-22 (×16): 300 mg via ORAL
  Filled 2022-08-15 (×16): qty 3

## 2022-08-15 MED ORDER — CYCLOSPORINE 0.05 % OP EMUL
2.0000 [drp] | Freq: Two times a day (BID) | OPHTHALMIC | Status: DC
  Administered 2022-08-15 – 2022-08-22 (×14): 2 [drp] via OPHTHALMIC
  Filled 2022-08-15 (×17): qty 30

## 2022-08-15 MED ORDER — POLYETHYLENE GLYCOL 3350 17 G PO PACK: 17.0000 g | PACK | ORAL | Status: DC | PRN

## 2022-08-15 MED ORDER — ONDANSETRON HCL 4 MG PO TABS: 4.0000 mg | ORAL_TABLET | Freq: Four times a day (QID) | ORAL | Status: DC | PRN

## 2022-08-15 MED ORDER — ONDANSETRON HCL 4 MG/2ML IJ SOLN: 4.0000 mg | Freq: Four times a day (QID) | INTRAMUSCULAR | Status: DC | PRN

## 2022-08-15 MED ORDER — ACETAMINOPHEN 650 MG RE SUPP: 650.0000 mg | Freq: Four times a day (QID) | RECTAL | Status: DC | PRN

## 2022-08-15 MED ORDER — ENOXAPARIN SODIUM 40 MG/0.4ML IJ SOSY
40.0000 mg | PREFILLED_SYRINGE | Freq: Every day | INTRAMUSCULAR | Status: DC
  Administered 2022-08-15 – 2022-08-22 (×8): 40 mg via SUBCUTANEOUS
  Filled 2022-08-15 (×8): qty 0.4

## 2022-08-15 MED ORDER — ACETAMINOPHEN 325 MG PO TABS
650.0000 mg | ORAL_TABLET | Freq: Four times a day (QID) | ORAL | Status: DC | PRN
  Administered 2022-08-15 – 2022-08-22 (×4): 650 mg via ORAL
  Filled 2022-08-15 (×5): qty 2

## 2022-08-15 MED ORDER — VALACYCLOVIR HCL 500 MG PO TABS
1000.0000 mg | ORAL_TABLET | Freq: Every day | ORAL | Status: DC
  Administered 2022-08-15 – 2022-08-22 (×8): 1000 mg via ORAL
  Filled 2022-08-15 (×8): qty 2

## 2022-08-15 MED ORDER — OXYCODONE HCL 5 MG PO TABS
5.0000 mg | ORAL_TABLET | Freq: Four times a day (QID) | ORAL | Status: DC | PRN
  Administered 2022-08-16: 5 mg via ORAL
  Filled 2022-08-15 (×2): qty 1

## 2022-08-15 MED ORDER — CELECOXIB 200 MG PO CAPS
200.0000 mg | ORAL_CAPSULE | Freq: Two times a day (BID) | ORAL | Status: DC
  Administered 2022-08-15 – 2022-08-22 (×16): 200 mg via ORAL
  Filled 2022-08-15 (×17): qty 1

## 2022-08-15 NOTE — Progress Notes (Signed)
    Patient comfortable with well controlled pain about RLE. Currently receiving ABX for osteo and tolerating well. She is eating and drinking well with NL B/B function.   Physical Exam: Vitals:   08/15/22 0535 08/15/22 0738  BP: 93/61 100/67  Pulse: 64 65  Resp: 15 18  Temp: 98.3 F (36.8 C) 97.7 F (36.5 C)  SpO2: 94% 100%   Resting comfortably in bed, wound vac in place on back and dressing in place LLE, swelling present with intact distal pedal pulses. NVI, pt appears comfortable.    Assessment/Plan:  Patient has developed an infection along the lateral aspect of her right ankle at the prior surgery site.  The plate had been removed previously in March due to slow wound healing however she does have a posterior tibial plate in place.  CT scan is concerning for osteomyelitis.  Her skin at this point in the area of infection is not amenable for surgery and therefore on the hospitalist service for empiric IV antibiotic treatment.  We are hopeful that she will have some response to the antibiotics.  If she does not have curative response with the antibiotic she may require deep orthopedic hardware removal with saucerization type procedure per Dr Susa Simmonds.  We did discuss that her wound is limb threatening and she understands. We will follow and update plan as we see her response to antibiotic therapy.   -Rec plastic surgery be notified of admission and wound issues   -Appreciate medical teams assistance in the care of this pt   -Pt on Vanc and Zosyn

## 2022-08-15 NOTE — Assessment & Plan Note (Signed)
Hold Adderall for the moment.

## 2022-08-15 NOTE — Plan of Care (Signed)
  Problem: Education: Goal: Knowledge of General Education information will improve Description: Including pain rating scale, medication(s)/side effects and non-pharmacologic comfort measures Outcome: Progressing   Problem: Health Behavior/Discharge Planning: Goal: Ability to manage health-related needs will improve Outcome: Progressing   Problem: Clinical Measurements: Goal: Ability to maintain clinical measurements within normal limits will improve Outcome: Progressing Goal: Respiratory complications will improve Outcome: Progressing Goal: Cardiovascular complication will be avoided Outcome: Progressing   Problem: Activity: Goal: Risk for activity intolerance will decrease Outcome: Progressing   Problem: Nutrition: Goal: Adequate nutrition will be maintained Outcome: Progressing   Problem: Elimination: Goal: Will not experience complications related to urinary retention Outcome: Progressing

## 2022-08-15 NOTE — ED Notes (Signed)
ED TO INPATIENT HANDOFF REPORT  ED Nurse Name and Phone #:  Ivonne Jarielys Girardot RN [phone]  S Name/Age/Gender [name] 67 y.o. female Room/Bed: TRACC/TRACC  Code Status   Code Status: Prior  Home/SNF/Other Home Patient oriented to: self, place, time, and situation Is this baseline? Yes   Triage Complete: Triage complete  Chief Complaint Osteomyelitis of right ankle (HCC) [M86.9]  Triage Note Pt to ED c/o ongoing pain to wound to right ankle. Pt is being followed for this problem outpatient. Reports orthopedic is concerned for infection/osteomyelitis  , Reports currently on ABX   Allergies No Known Allergies  Level of Care/Admitting Diagnosis ED Disposition     ED Disposition  Admit   Condition  --   Comment  Hospital Area: [facility] [[id number]]  Level of Care: Med-Surg [16]  May place patient in observation at [facility] or [facility] if equivalent level of care is available:: No  Covid Evaluation: Asymptomatic - no recent exposure (last 10 days) testing not required  Diagnosis: Osteomyelitis of right ankle [facility]) [[id number]]  Admitting Physician: Hillary Bow [[id number]]  Attending Physician: Hillary Bow [[id number]]          B Medical/Surgery History Past Medical History:  Diagnosis Date   ADHD (attention deficit hyperactivity disorder)    Anemia    Anxiety    ARDS (adult respiratory distress syndrome) (HCC) 2007   Arthritis    Depression    Gallstones 01/07/2018   Ganglion cyst of dorsum of right wrist    Headache    Hepatitis    2004 non detectable now   Hypertension    MRSA (methicillin resistant Staphylococcus aureus) 2007   Osteoporosis    Pneumonia 2009   Prosthetic eye globe    right eye    Raynaud's disease    Past Surgical History:  Procedure Laterality Date   ABDOMINAL HYSTERECTOMY     ANTERIOR CERVICAL DECOMP/DISCECTOMY FUSION N/A 12/12/2014   Procedure: ANTERIOR CERVICAL DECOMPRESSION/DISCECTOMY FUSION 3  LEVELS;  Surgeon: Estill Bamberg, MD;  Location: [facility] OR;  Service: Orthopedics;  Laterality: N/A;  Anterior cervical decompression fusion, cerivcal 5-6, cervical 6-7, cervical 7-thoracic 1 with instrumentation and allografrt   APLIGRAFT PLACEMENT Right 04/16/2022   Procedure: APPLICATION OF SKIN SUBSTITUTE GRAFT;  Surgeon: Terance Hart, MD;  Location: [facility] OR;  Service: Orthopedics;  Laterality: Right;   APPENDECTOMY  1974   APPLICATION OF WOUND VAC Left 07/22/2022   Procedure: APPLICATION OF WOUND VAC AND MYRIAD;  Surgeon: Peggye Form, DO;  Location: [facility] OR;  Service: Plastics;  Laterality: Left;   CATARACT EXTRACTION Left 11/2018   CHOLECYSTECTOMY N/A 01/07/2018   Procedure: LAPAROSCOPIC CHOLECYSTECTOMY WITH INTRAOPERATIVE CHOLANGIOGRAM;  Surgeon: Claud Kelp, MD;  Location: [facility] OR;  Service: General;  Laterality: N/A;   COLONOSCOPY  04/14/2013   per Dr. Christella Hartigan, clear, repeat in 10 yrs    CORNEAL TRANSPLANT Right 2007   x4   ENUCLEATION Right 2013   eye infection   GANGLION CYST EXCISION Right 05/25/2016   Procedure: RIGHT WRIST ULNAR GANGLION EXCISION;  Surgeon: Mack Hook, MD;  Location: [facility];  Service: Orthopedics;  Laterality: Right;   HEMATOMA EVACUATION Left 07/22/2022   Procedure: EVACUATION HEMATOMA LEFT BACK WITH EXCISION OF WOUND;  Surgeon: Peggye Form, DO;  Location: [facility] OR;  Service: Plastics;  Laterality: Left;   I & D EXTREMITY Right 05/28/2022   Procedure: right ankle wound debridement with myriad placement;  Surgeon: Ulice Bold,  [name], DO;  Location: [facility];  Service: Plastics;  Laterality: Right;   INCISION AND DRAINAGE Right 07/22/2022   Procedure: INCISION AND DRAINAGE RIGHT ANKLE;  Surgeon: Peggye Form, DO;  Location: [facility] OR;  Service: Plastics;  Laterality: Right;   MINOR HARDWARE REMOVAL Right 04/16/2022   Procedure: REPAIR OF SURGICAL WOUND DEHISCENCE RIGHT ANKLE, REMOVAL OF DEEP ORTHOPEDIC  HARDWARE;  Surgeon: Terance Hart, MD;  Location: [facility] OR;  Service: Orthopedics;  Laterality: Right;   ORIF CLAVICULAR FRACTURE Left 02/03/2022   Procedure: OPEN REDUCTION INTERNAL FIXATION (ORIF) LEFT CLAVICULAR FRACTURE WITH CORACOLAVICULAR FIXATION;  Surgeon: Jones Broom, MD;  Location: [facility] OR;  Service: Orthopedics;  Laterality: Left;  BIOMET ZIP LOOP WITH CLAVICLE PLATES   ORIF FIBULA FRACTURE Right 02/03/2022   Procedure: OPEN TREATMENT OF RIGHT PILON ANKLE FRACTURE INCLUDING LATERAL MALLEOLUS FRACTURE;  Surgeon: Terance Hart, MD;  Location: [facility] OR;  Service: Orthopedics;  Laterality: Right;  LENGTH OF SURGERY: 120 MINUTES   REVERSE SHOULDER ARTHROPLASTY Right 07/10/2021   Procedure: REVERSE SHOULDER ARTHROPLASTY;  Surgeon: Jones Broom, MD;  Location: [facility];  Service: Orthopedics;  Laterality: Right;   RHINOPLASTY  1980   ROBOTIC ASSISTED LAPAROSCOPIC SACROCOLPOPEXY Bilateral 06/13/2020   Procedure: XI ROBOTIC ASSISTED LAPAROSCOPIC SACROCOLPOPEXY AND SUPRACERVICAL HYSTERECTOMY AND BILATERAL  SALPINGO OOPHERECTOMY;  Surgeon: Crist Fat, MD;  Location: [facility];  Service: Urology;  Laterality: Bilateral;   SYNDESMOSIS REPAIR Right 02/03/2022   Procedure: POSSIBLE OPEN TREATMENT RIGHT SYNDESMOSIS;  Surgeon: Terance Hart, MD;  Location: [facility] OR;  Service: Orthopedics;  Laterality: Right;   TONSILLECTOMY     removed in 3rd grade   TOTAL SHOULDER ARTHROPLASTY Left 03/13/2019   Procedure: TOTAL SHOULDER ARTHROPLASTY;  Surgeon: Jones Broom, MD;  Location: [facility];  Service: Orthopedics;  Laterality: Left;     A IV Location/Drains/Wounds Patient Lines/Drains/Airways Status     Active Line/Drains/Airways     Name Placement date Placement time Site Days   Peripheral IV 07/22/22 20 G Left;Posterior Hand 07/22/22  1322  Hand  24   Peripheral IV 08/14/22 20 G Anterior;Distal;Right Forearm 08/14/22  2017  Forearm  1   Negative Pressure Wound  Therapy Back Left;Lower 07/22/22  1547  --  24   Incision - 4 Ports Abdomen 1: Right 2: Right 3: Mid;Upper 4: Umbilicus 01/07/18  1539  -- 1681   Incision - 6 Ports Abdomen 1: Left;Lateral;Lower 2: Left;Lateral;Upper 3: Umbilicus 4: Right;Lateral;Lower 5: Right;Medial;Upper 6: Right;Lateral;Upper 06/13/20  --  -- 793            Intake/Output Last 24 hours No intake or output data in the 24 hours ending 08/15/22 0021  Labs/Imaging Results for orders placed or performed during the hospital encounter of 08/14/22 (from the past 48 hour(s))  Comprehensive metabolic panel     Status: Abnormal   Collection Time: 08/14/22  2:57 PM  Result Value Ref Range   Sodium 131 (L) 135 - 145 mmol/L   Potassium 3.9 3.5 - 5.1 mmol/L   Chloride 97 (L) 98 - 111 mmol/L   CO2 23 22 - 32 mmol/L   Glucose, Bld 115 (H) 70 - 99 mg/dL    Comment: Glucose reference range applies only to samples taken after fasting for at least 8 hours.   BUN 9 8 - 23 mg/dL   Creatinine, Ser 1.61 0.44 - 1.00 mg/dL   Calcium 8.5 (L) 8.9 - 10.3 mg/dL   Total Protein 7.5 6.5 -  8.1 g/dL   Albumin 2.9 (L) 3.5 - 5.0 g/dL   AST 22 15 - 41 U/L   ALT 11 0 - 44 U/L   Alkaline Phosphatase 93 38 - 126 U/L   Total Bilirubin 0.5 0.3 - 1.2 mg/dL   GFR, Estimated >96 >29 mL/min    Comment: (NOTE) Calculated using the CKD-EPI Creatinine Equation (2021)    Anion gap 11 5 - 15    Comment: Performed at [facility], [address] [place], [place] [address]  CBC with Differential     Status: Abnormal   Collection Time: 08/14/22  2:57 PM  Result Value Ref Range   WBC 11.0 (H) 4.0 - 10.5 K/uL   RBC 3.56 (L) 3.87 - 5.11 MIL/uL   Hemoglobin 10.5 (L) 12.0 - 15.0 g/dL   HCT 32.4 (L) 40.1 - 02.7 %   MCV 93.5 80.0 - 100.0 fL   MCH 29.5 26.0 - 34.0 pg   MCHC 31.5 30.0 - 36.0 g/dL   RDW 25.3 (H) 66.4 - 40.3 %   Platelets 223 150 - 400 K/uL   nRBC 0.0 0.0 - 0.2 %   Neutrophils Relative % 78 %   Neutro Abs 8.7 (H) 1.7 - 7.7 K/uL    Lymphocytes Relative 11 %   Lymphs Abs 1.2 0.7 - 4.0 K/uL   Monocytes Relative 8 %   Monocytes Absolute 0.9 0.1 - 1.0 K/uL   Eosinophils Relative 1 %   Eosinophils Absolute 0.1 0.0 - 0.5 K/uL   Basophils Relative 0 %   Basophils Absolute 0.0 0.0 - 0.1 K/uL   Immature Granulocytes 2 %   Abs Immature Granulocytes 0.18 (H) 0.00 - 0.07 K/uL    Comment: Performed at [facility], [address], [place], [place] [address]  Lactic acid, plasma     Status: None   Collection Time: 08/14/22  2:57 PM  Result Value Ref Range   Lactic Acid, Venous 1.3 0.5 - 1.9 mmol/L    Comment: Performed at [facility], [address], [place], [place] [address]   CT Foot Right Wo Contrast  Result Date: 08/14/2022 CLINICAL DATA:  Osteomyelitis suspected. History of right ankle pilon fracture EXAM: CT OF THE RIGHT FOOT WITHOUT CONTRAST TECHNIQUE: Multidetector CT imaging of the right foot was performed according to the standard protocol. Multiplanar CT image reconstructions were also generated. RADIATION DOSE REDUCTION: This exam was performed according to the departmental dose-optimization program which includes automated exposure control, adjustment of the mA and/or kV according to patient size and/or use of iterative reconstruction technique. COMPARISON:  Right ankle radiographs 02/03/2022. CT of the right ankle 02/02/2022. FINDINGS: Bones/Joint/Cartilage There are postsurgical changes in the distal tibia status post posterior plate and screw fixation of the comminuted intra-articular fracture. There is an additional cortical screw within the medial malleolus. The fracture of the distal tibia has healed with mild residual posttraumatic deformity extending to the articular surface. Previously demonstrated lateral fibular plate and screws have been removed in the interval. Oblique fracture of the distal fibula shows incomplete osseous healing, suspicious for nonunion. Focal osteolysis of the lateral malleolus  with cortical destruction, best seen on sagittal image 13/8, suspicious for osteomyelitis. No evidence for osteomyelitis within the foot. The bones are diffusely demineralized. There is no evidence of acute fracture or dislocation. There is a small ankle joint effusion. Ligaments Suboptimally assessed by CT. Muscles and Tendons The peroneal tendons are poorly defined at the level of the  distal fibula and appear laterally subluxed from the retromalleolar groove. Cannot confirm tendon integrity. The Achilles, medial flexor and anterior extensor tendons appear intact. No focal musculotendinous abnormalities identified in the foot. Soft tissues Lateral soft tissue swelling at the ankle with soft tissue ulceration peripheral to the lateral malleolus suspicious for soft tissue infection. No focal fluid collection, soft tissue emphysema or unexpected foreign body identified. Nonspecific subcutaneous edema in the dorsal aspect of the foot. IMPRESSION: 1. Lateral soft tissue swelling at the ankle with soft tissue ulceration peripheral to the lateral malleolus, suspicious for soft tissue infection. No focal fluid collection, soft tissue emphysema or unexpected foreign body identified. 2. Focal osteolysis of the adjacent lateral malleolus with cortical destruction, suspicious for osteomyelitis. 3. Previously demonstrated lateral fibular plate and screws have been removed. Incomplete osseous healing of the oblique fracture of the distal fibula, suspicious for nonunion. 4. No evidence of osteomyelitis within the foot. Correlate with additional areas of concern. 5. The peroneal tendons are poorly defined at the level of the distal fibula and appear laterally subluxed from the retromalleolar groove. Cannot confirm tendon integrity. Electronically Signed   By: Carey Bullocks M.D.   On: 08/14/2022 16:15    Pending Labs Unresulted Labs (From admission, onward)     Start     Ordered   08/14/22 2212  Aerobic Culture w Gram Stain  (superficial specimen)  Once,   URGENT        08/14/22 2211   08/14/22 1450  Lactic acid, plasma  Now then every 2 hours,   R (with STAT occurrences)      08/14/22 1449   08/14/22 1449  Blood Cultures x 2 sites  BLOOD CULTURE X 2,   STAT      08/14/22 1449            Vitals/Pain Today's Vitals   08/14/22 2359 08/15/22 0000 08/15/22 0003 08/15/22 0015  BP: (!) 132/99 124/74  (!) 112/99  Pulse:  80  79  Resp:  15  17  Temp:      TempSrc:      SpO2:  94%  96%  Weight:      Height:      PainSc:   4      Isolation Precautions No active isolations  Medications Medications  HYDROmorphone (DILAUDID) injection 0.5-1 mg (1 mg Intravenous Given 08/14/22 2324)  verapamil (CALAN-SR) CR tablet 120 mg (120 mg Oral Given 08/14/22 2359)  piperacillin-tazobactam (ZOSYN) IVPB 3.375 g (has no administration in time range)  vancomycin (VANCOCIN) IVPB 1000 mg/200 mL premix (has no administration in time range)  HYDROmorphone (DILAUDID) 1 MG/ML injection (  Not Given 08/14/22 2350)  vancomycin (VANCOCIN) IVPB 1000 mg/200 mL premix (0 mg Intravenous Stopped 08/14/22 2254)  piperacillin-tazobactam (ZOSYN) IVPB 3.375 g (0 g Intravenous Stopped 08/14/22 2108)  HYDROmorphone (DILAUDID) injection 0.5 mg (0.5 mg Intravenous Given 08/14/22 2029)  oxyCODONE (Oxy IR/ROXICODONE) immediate release tablet 10 mg (10 mg Oral Given 08/14/22 2130)    Mobility Walks with device     Focused Assessments     R Recommendations: See Admitting Provider Note  Report given to:   Additional Notes:
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
## 2022-08-16 DIAGNOSIS — G894 Chronic pain syndrome: Secondary | ICD-10-CM | POA: Diagnosis not present

## 2022-08-16 DIAGNOSIS — M86171 Other acute osteomyelitis, right ankle and foot: Secondary | ICD-10-CM

## 2022-08-16 DIAGNOSIS — L03115 Cellulitis of right lower limb: Secondary | ICD-10-CM | POA: Diagnosis not present

## 2022-08-16 MED ORDER — OXYCODONE HCL 5 MG PO TABS
20.0000 mg | ORAL_TABLET | Freq: Three times a day (TID) | ORAL | Status: DC
  Administered 2022-08-16 – 2022-08-22 (×19): 20 mg via ORAL
  Filled 2022-08-16 (×19): qty 4

## 2022-08-16 MED ORDER — SENNOSIDES-DOCUSATE SODIUM 8.6-50 MG PO TABS
2.0000 | ORAL_TABLET | Freq: Two times a day (BID) | ORAL | Status: DC
  Administered 2022-08-16 – 2022-08-21 (×6): 2 via ORAL
  Filled 2022-08-16 (×10): qty 2

## 2022-08-16 MED ORDER — POLYETHYLENE GLYCOL 3350 17 G PO PACK
17.0000 g | PACK | Freq: Every day | ORAL | Status: DC
  Administered 2022-08-16 – 2022-08-17 (×2): 17 g via ORAL
  Filled 2022-08-16 (×5): qty 1

## 2022-08-16 MED ORDER — NALOXONE HCL 0.4 MG/ML IJ SOLN: 0.4000 mg | INTRAMUSCULAR | Status: DC | PRN

## 2022-08-16 NOTE — Plan of Care (Addendum)
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
## 2022-08-16 NOTE — Progress Notes (Addendum)
Pt continues to ring for pain meds before time is due, and right at PRN scheduled times.  Pt verbalizes that her outer ankle hurts really bad, and wakes up d/t pain. She describes her pain 10/10 "throbbing sensation", even with the administration of dilaudid 1 mg, and oxycodone 5 mg w/tylenol 650 (in between times)  Pt states that she takes oxycodone 20 mg at home, and oxycodone 5 mg mixed w/tylenol 650 won't do anything. Pt irritated over oxycodone 5mg  prn order, and says dilaudid 1mg  works better for her.  Pt asked me if I could give her dilaudid w/oxycodone 5mg  at the same time. I explained to her that doing that has it risks, and it is not safe to administer both pain narcs at the same time.   Pt looks anxious, looks at the clock, and asks when the next dose is due, During wait times, pt is found to be asleep. Pt is able to fully move right foot w/o issues, and is able to transfer to the commode with minimal assistance even when she says she is in pain, she raised her leg up showing improvement of right leg.  Pt mildly upset of oxycodone 5mg  prn order.

## 2022-08-17 ENCOUNTER — Encounter: Payer: Commercial Managed Care - PPO | Admitting: Plastic Surgery

## 2022-08-17 DIAGNOSIS — G894 Chronic pain syndrome: Secondary | ICD-10-CM | POA: Diagnosis not present

## 2022-08-17 DIAGNOSIS — L03115 Cellulitis of right lower limb: Secondary | ICD-10-CM | POA: Diagnosis not present

## 2022-08-17 DIAGNOSIS — M86171 Other acute osteomyelitis, right ankle and foot: Secondary | ICD-10-CM | POA: Diagnosis not present

## 2022-08-17 LAB — BASIC METABOLIC PANEL
Anion gap: 8 (ref 5–15)
BUN: 5 mg/dL — ABNORMAL LOW (ref 8–23)
CO2: 26 mmol/L (ref 22–32)
Calcium: 8.3 mg/dL — ABNORMAL LOW (ref 8.9–10.3)
Chloride: 105 mmol/L (ref 98–111)
Creatinine, Ser: 0.6 mg/dL (ref 0.44–1.00)
GFR, Estimated: >60 mL/min (ref >60)
Glucose, Bld: 92 mg/dL (ref 70–99)
Potassium: 3.7 mmol/L (ref 3.5–5.1)
Sodium: 139 mmol/L (ref 135–145)

## 2022-08-17 LAB — CBC
HCT: 30.9 % — ABNORMAL LOW (ref 36.0–46.0)
Hemoglobin: 9.7 g/dL — ABNORMAL LOW (ref 12.0–15.0)
MCH: 30.2 pg (ref 26.0–34.0)
MCHC: 31.4 g/dL (ref 30.0–36.0)
MCV: 96.3 fL (ref 80.0–100.0)
Platelets: 193 10*3/uL (ref 150–400)
RBC: 3.21 MIL/uL — ABNORMAL LOW (ref 3.87–5.11)
RDW: 16.1 % — ABNORMAL HIGH (ref 11.5–15.5)
WBC: 4.3 10*3/uL (ref 4.0–10.5)
nRBC: 0 % (ref 0.0–0.2)

## 2022-08-17 LAB — SURGICAL PCR SCREEN
MRSA, PCR: NEGATIVE
Staphylococcus aureus: NEGATIVE

## 2022-08-17 LAB — AEROBIC CULTURE W GRAM STAIN (SUPERFICIAL SPECIMEN)

## 2022-08-17 LAB — CULTURE, BLOOD (ROUTINE X 2)
Culture: NO GROWTH
Special Requests: ADEQUATE

## 2022-08-17 LAB — VANCOMYCIN, TROUGH: Vancomycin Tr: 7 ug/mL — ABNORMAL LOW (ref 15–20)

## 2022-08-17 MED ORDER — AMPHETAMINE-DEXTROAMPHET ER 10 MG PO CP24
20.0000 mg | ORAL_CAPSULE | ORAL | Status: DC
  Administered 2022-08-17 – 2022-08-22 (×6): 20 mg via ORAL
  Filled 2022-08-17 (×8): qty 2

## 2022-08-17 NOTE — Progress Notes (Signed)
     [name] is a 67 y.o. female   Orthopaedic diagnosis: Status post ORIF right ankle fracture with lateral wound dehiscence and soft tissue infection with CT scan worrisome for osteomyelitis in the distal fibula  Subjective: Patient resting comfortably.  She notes pain and swelling have improved considerably over the weekend with admission and IV antibiotics.  She denies any fevers or chills.  White blood cell count has normalized.  Patient seen with plastic surgery at bedside as well.  Objectyive: Vitals:   08/16/22 1939 08/17/22 0404  BP: 102/64 (!) 110/56  Pulse: 76 67  Resp: 17 16  Temp: 98.3 F (36.8 C) 98.1 F (36.7 C)  SpO2: 95% 95%     Exam: Awake and alert Respirations even and unlabored No acute distress  Right ankle shows interval improvement in erythema, tenderness, and swelling about the lateral aspect of the ankle.  Persist with dehisced lateral ankle wound with surrounding erythema and drainage.  Lateral aspect the ankle is tender to palpation.  No other areas of tenderness or skin breakdown in the foot and ankle.  Calf soft nontender.  Warm and well-perfused distally.  Tolerates range of motion in all planes.  Assessment: Status post ORIF right ankle fracture with lateral wound dehiscence and soft tissue infection with CT scan worrisome for osteomyelitis in the distal fibula   Plan: Patient with improvement with IV antibiotics.  Does have CT scan evidence of osteolysis involving the distal fibula.  She may have bony infection here.  She additionally has retained hardware in the posterior distal tibia.  We discussed hardware removal with saucerization type procedure of the distal fibula to address any deep infection as well as to remove hardware that may harbor infection.  She is agreeable to proceeding with this.  We will coordinate with plastic surgery with regard to wound care and management intraoperatively and postoperatively.  Please keep n.p.o.  past midnight.  Continue with IV antibiotics and pain control as needed.    Beverly Jeffords J. [place], PA-C
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
## 2022-08-17 NOTE — Consult Note (Addendum)
WOC Nurse Consult Note: Reason for Consult: low back wound vac, wound to R lateral ankle  Wound type: full thickness postop infectious  Pressure Injury POA: NA  Measurement: R lateral malleolus  2 cm x 3 cm x 0.5 cm  Wound bed:50% red moist 50% yellow fibrinous tissue  Drainage (amount, consistency, odor) minimal tan exudate  Periwound: erythematous, edematous  Dressing procedure/placement/frequency: Clean R lateral malleolus with NS, apply Xeroform gauze [place] [phone]) cut to fit wound bed daily, cover with dry gauze and wrap with Kerlix roll gauze.    Plastic surgery will be coming to change NPWT to low back as they are managing this wound.  Ortho has consulted on R ankle; no surgery at this time, managing with IV antibiotics. CT scan concerning for osteomyelitis.    POC discussed with patient and bedside nurse. WOC team will not follow at this time. Re-consult if further needs arise.   Thank you,    Priscella Mann MSN, RN-BC, [employer] [phone]

## 2022-08-17 NOTE — Plan of Care (Signed)
  Problem: Education: Goal: Knowledge of General Education information will improve Description: Including pain rating scale, medication(s)/side effects and non-pharmacologic comfort measures Outcome: Progressing   Problem: Health Behavior/Discharge Planning: Goal: Ability to manage health-related needs will improve Outcome: Progressing   Problem: Nutrition: Goal: Adequate nutrition will be maintained Outcome: Progressing   

## 2022-08-17 NOTE — Plan of Care (Signed)
  Problem: Education: Goal: Knowledge of General Education information will improve Description: Including pain rating scale, medication(s)/side effects and non-pharmacologic comfort measures Outcome: Progressing   Problem: Health Behavior/Discharge Planning: Goal: Ability to manage health-related needs will improve Outcome: Progressing   Problem: Clinical Measurements: Goal: Will remain free from infection Outcome: Progressing   

## 2022-08-17 NOTE — Consult Note (Signed)
Reason for Consult: Low back wound VAC, right ankle wound Referring Physician: Dr. Rito Ehrlich, Dr. Garry Heater [name] is an 67 y.o. female.  HPI: Patient is a pleasant 67 year old female with history of low back hematoma s/p evacuation with placement of myriad and wound VAC as well as excision of right ankle wound with placement of myriad performed 07/22/2022 by Dr. Ulice Bold who was admitted to the hospital 08/15/2022 for right ankle osteomyelitis.  After 1 week of progressively worsening right ankle swelling and discomfort, she was seen by orthopedics who was referred to the ER given concern for osteomyelitis.  CT scan obtained revealed lateral soft tissue swelling suspicious for soft tissue infection and focal osteolysis of the adjacent lateral malleolus with cortical destruction suspicious for osteomyelitis.  Patient had endured bone burr to the lateral malleolus at time of wound excision and myriad placement 07/22/2022 which may have complicated the findings.  However clinically there was certainly concern for infection.  She reports that shortly after her hospitalization she was hooked up to a Prevena wound VAC, but has not had VAC change since most recent outpatient encounter 08/11/2022.  She denies any low back pain in the area of her wound VAC.  As for her right ankle, she states that her symptoms have improved considerably and she can now tolerate weightbearing without any severe pain.  She also reports that her swelling has improved considerably.  Past Medical History:  Diagnosis Date   ADHD (attention deficit hyperactivity disorder)    Anemia    Anxiety    ARDS (adult respiratory distress syndrome) (HCC) 2007   Arthritis    Depression    Gallstones 01/07/2018   Ganglion cyst of dorsum of right wrist    Headache    Hepatitis    2004 non detectable now   Hypertension    MRSA (methicillin resistant Staphylococcus aureus) 2007   Osteoporosis    Pneumonia 2009   Prosthetic eye  globe    right eye    Raynaud's disease     Past Surgical History:  Procedure Laterality Date   ABDOMINAL HYSTERECTOMY     ANTERIOR CERVICAL DECOMP/DISCECTOMY FUSION N/A 12/12/2014   Procedure: ANTERIOR CERVICAL DECOMPRESSION/DISCECTOMY FUSION 3 LEVELS;  Surgeon: Estill Bamberg, MD;  Location: [facility] OR;  Service: Orthopedics;  Laterality: N/A;  Anterior cervical decompression fusion, cerivcal 5-6, cervical 6-7, cervical 7-thoracic 1 with instrumentation and allografrt   APLIGRAFT PLACEMENT Right 04/16/2022   Procedure: APPLICATION OF SKIN SUBSTITUTE GRAFT;  Surgeon: Terance Hart, MD;  Location: [facility] OR;  Service: Orthopedics;  Laterality: Right;   APPENDECTOMY  1974   APPLICATION OF WOUND VAC Left 07/22/2022   Procedure: APPLICATION OF WOUND VAC AND MYRIAD;  Surgeon: Peggye Form, DO;  Location: [facility] OR;  Service: Plastics;  Laterality: Left;   CATARACT EXTRACTION Left 11/2018   CHOLECYSTECTOMY N/A 01/07/2018   Procedure: LAPAROSCOPIC CHOLECYSTECTOMY WITH INTRAOPERATIVE CHOLANGIOGRAM;  Surgeon: Claud Kelp, MD;  Location: [facility] OR;  Service: General;  Laterality: N/A;   COLONOSCOPY  04/14/2013   per Dr. Christella Hartigan, clear, repeat in 10 yrs    CORNEAL TRANSPLANT Right 2007   x4   ENUCLEATION Right 2013   eye infection   GANGLION CYST EXCISION Right 05/25/2016   Procedure: RIGHT WRIST ULNAR GANGLION EXCISION;  Surgeon: Mack Hook, MD;  Location: [facility];  Service: Orthopedics;  Laterality: Right;   HEMATOMA EVACUATION Left 07/22/2022   Procedure: EVACUATION HEMATOMA LEFT BACK WITH EXCISION OF WOUND;  Surgeon: Foster Simpson  S, DO;  Location: [facility] OR;  Service: Plastics;  Laterality: Left;   I & D EXTREMITY Right 05/28/2022   Procedure: right ankle wound debridement with myriad placement;  Surgeon: Peggye Form, DO;  Location: [facility];  Service: Plastics;  Laterality: Right;   INCISION AND DRAINAGE Right 07/22/2022   Procedure: INCISION AND  DRAINAGE RIGHT ANKLE;  Surgeon: Peggye Form, DO;  Location: [facility] OR;  Service: Plastics;  Laterality: Right;   MINOR HARDWARE REMOVAL Right 04/16/2022   Procedure: REPAIR OF SURGICAL WOUND DEHISCENCE RIGHT ANKLE, REMOVAL OF DEEP ORTHOPEDIC HARDWARE;  Surgeon: Terance Hart, MD;  Location: [facility] OR;  Service: Orthopedics;  Laterality: Right;   ORIF CLAVICULAR FRACTURE Left 02/03/2022   Procedure: OPEN REDUCTION INTERNAL FIXATION (ORIF) LEFT CLAVICULAR FRACTURE WITH CORACOLAVICULAR FIXATION;  Surgeon: Jones Broom, MD;  Location: [facility] OR;  Service: Orthopedics;  Laterality: Left;  BIOMET ZIP LOOP WITH CLAVICLE PLATES   ORIF FIBULA FRACTURE Right 02/03/2022   Procedure: OPEN TREATMENT OF RIGHT PILON ANKLE FRACTURE INCLUDING LATERAL MALLEOLUS FRACTURE;  Surgeon: Terance Hart, MD;  Location: [facility] OR;  Service: Orthopedics;  Laterality: Right;  LENGTH OF SURGERY: 120 MINUTES   REVERSE SHOULDER ARTHROPLASTY Right 07/10/2021   Procedure: REVERSE SHOULDER ARTHROPLASTY;  Surgeon: Jones Broom, MD;  Location: [facility] ORS;  Service: Orthopedics;  Laterality: Right;   RHINOPLASTY  1980   ROBOTIC ASSISTED LAPAROSCOPIC SACROCOLPOPEXY Bilateral 06/13/2020   Procedure: XI ROBOTIC ASSISTED LAPAROSCOPIC SACROCOLPOPEXY AND SUPRACERVICAL HYSTERECTOMY AND BILATERAL  SALPINGO OOPHERECTOMY;  Surgeon: Crist Fat, MD;  Location: [facility] ORS;  Service: Urology;  Laterality: Bilateral;   SYNDESMOSIS REPAIR Right 02/03/2022   Procedure: POSSIBLE OPEN TREATMENT RIGHT SYNDESMOSIS;  Surgeon: Terance Hart, MD;  Location: [facility] [place] OR;  Service: Orthopedics;  Laterality: Right;   TONSILLECTOMY     removed in 3rd grade   TOTAL SHOULDER ARTHROPLASTY Left 03/13/2019   Procedure: TOTAL SHOULDER ARTHROPLASTY;  Surgeon: Jones Broom, MD;  Location: [facility];  Service: Orthopedics;  Laterality: Left;    Family History  Problem Relation Age of Onset   Breast cancer Maternal Aunt 57   Colon cancer  Neg Hx     Social History:  reports that she has never smoked. She has never used smokeless tobacco. She reports that she does not currently use alcohol. She reports that she does not use drugs.  Allergies: No Known Allergies  Medications: I have reviewed the patient's current medications.  Results for orders placed or performed during the hospital encounter of 08/14/22 (from the past 48 hour(s))  Glucose, capillary     Status: None   Collection Time: 08/15/22  4:56 PM  Result Value Ref Range   Glucose-Capillary 89 70 - 99 mg/dL    Comment: Glucose reference range applies only to samples taken after fasting for at least 8 hours.  CBC     Status: Abnormal   Collection Time: 08/17/22  3:23 AM  Result Value Ref Range   WBC 4.3 4.0 - 10.5 K/uL   RBC 3.21 (L) 3.87 - 5.11 MIL/uL   Hemoglobin 9.7 (L) 12.0 - 15.0 g/dL   HCT 96.0 (L) 45.4 - 09.8 %   MCV 96.3 80.0 - 100.0 fL   MCH 30.2 26.0 - 34.0 pg   MCHC 31.4 30.0 - 36.0 g/dL   RDW 11.9 (H) 14.7 - 82.9 %   Platelets 193 150 - 400 K/uL   nRBC 0.0 0.0 - 0.2 %    Comment: Performed  at [facility], [address], [place], [place] [address]  Basic metabolic panel     Status: Abnormal   Collection Time: 08/17/22  3:23 AM  Result Value Ref Range   Sodium 139 135 - 145 mmol/L   Potassium 3.7 3.5 - 5.1 mmol/L   Chloride 105 98 - 111 mmol/L   CO2 26 22 - 32 mmol/L   Glucose, Bld 92 70 - 99 mg/dL    Comment: Glucose reference range applies only to samples taken after fasting for at least 8 hours.   BUN 5 (L) 8 - 23 mg/dL   Creatinine, Ser 6.21 0.44 - 1.00 mg/dL   Calcium 8.3 (L) 8.9 - 10.3 mg/dL   GFR, Estimated >30 >86 mL/min    Comment: (NOTE) Calculated using the CKD-EPI Creatinine Equation (2021)    Anion gap 8 5 - 15    Comment: Performed at [facility], [address], [place], [place] [address]    No results found.  ROS Blood pressure (!) 110/56, pulse 67, temperature 98.1 F (36.7 C), temperature source Oral,  resp. rate 16, height 5' 1.5" (1.562 m), weight 49.3 kg, SpO2 95%.  Denies low back pain.  Endorses reduced swelling of right lower extremity.  Physical Exam Constitutional:      General: She is not in acute distress.    Appearance: Normal appearance. She is not toxic-appearing.  Pulmonary:     Effort: Pulmonary effort is normal.  Musculoskeletal:     Cervical back: Normal range of motion.     Comments: VAC is carefully removed revealing 9 x 7 cm wound with 0.5 cm depth superiorly, but approximately 2 cm depth inferiorly and tracking beneath inferior edge.  Healthy and improved granulation tissue.  Surrounding skin appears healthy, no significant erythema or induration.  Area is nontender.  No malodor.  No significant drainage appreciated.  Neurological:     Mental Status: She is alert and oriented to person, place, and time.  Psychiatric:        Mood and Affect: Mood normal.        Behavior: Behavior normal.      Assessment/Plan:  Right ankle osteomyelitis: Management per orthopedics. Will remain available to assist with wound management.  Low back wound: Wound VAC replaced today. Wound looks clean. Continued improvement compared to last change. Will need additional bedside change on Friday if she remains hospitalized at that time.    Evelena Leyden 08/17/2022, 2:16 PM

## 2022-08-17 NOTE — Progress Notes (Signed)
Mobility Specialist: Progress Note   08/17/22 1139  Mobility  Activity Ambulated with assistance in hallway  Level of Assistance Contact guard assist, steadying assist  Assistive Device Front wheel walker  Distance Ambulated (ft) 20 ft  Activity Response Tolerated well  Mobility Referral Yes  $Mobility charge 1 Mobility  Mobility Specialist Start Time (ACUTE ONLY) 1115  Mobility Specialist Stop Time (ACUTE ONLY) 1130  Mobility Specialist Time Calculation (min) (ACUTE ONLY) 15 min   Pt was agreeable to mobility session - received in bed. SV for STS, CG for ambulation. Had c/o "shooting" pain in R ankle throughout ambulation. Pt left in chair with all needs met. Call bell in reach.   Maurene Capes Mobility Specialist Please contact via SecureChat or Rehab office at [phone]

## 2022-08-18 DIAGNOSIS — L03115 Cellulitis of right lower limb: Secondary | ICD-10-CM | POA: Diagnosis not present

## 2022-08-18 DIAGNOSIS — G894 Chronic pain syndrome: Secondary | ICD-10-CM | POA: Diagnosis not present

## 2022-08-18 DIAGNOSIS — M86171 Other acute osteomyelitis, right ankle and foot: Secondary | ICD-10-CM | POA: Diagnosis not present

## 2022-08-18 LAB — CBC
HCT: 31.5 % — ABNORMAL LOW (ref 36.0–46.0)
Hemoglobin: 10.1 g/dL — ABNORMAL LOW (ref 12.0–15.0)
MCH: 30.1 pg (ref 26.0–34.0)
MCHC: 32.1 g/dL (ref 30.0–36.0)
MCV: 94 fL (ref 80.0–100.0)
Platelets: 199 10*3/uL (ref 150–400)
RBC: 3.35 MIL/uL — ABNORMAL LOW (ref 3.87–5.11)
RDW: 15.9 % — ABNORMAL HIGH (ref 11.5–15.5)
WBC: 5.7 10*3/uL (ref 4.0–10.5)
nRBC: 0 % (ref 0.0–0.2)

## 2022-08-18 LAB — AEROBIC CULTURE W GRAM STAIN (SUPERFICIAL SPECIMEN)

## 2022-08-18 LAB — RETICULOCYTES
Immature Retic Fract: 14.5 % (ref 2.3–15.9)
RBC.: 3.39 MIL/uL — ABNORMAL LOW (ref 3.87–5.11)
Retic Count, Absolute: 56.3 10*3/uL (ref 19.0–186.0)
Retic Ct Pct: 1.7 % (ref 0.4–3.1)

## 2022-08-18 LAB — IRON AND TIBC
Iron: 30 ug/dL (ref 28–170)
Saturation Ratios: 12 % (ref 10.4–31.8)
TIBC: 253 ug/dL (ref 250–450)
UIBC: 223 ug/dL

## 2022-08-18 LAB — FERRITIN: Ferritin: 300 ng/mL (ref 11–307)

## 2022-08-18 LAB — VANCOMYCIN, PEAK: Vancomycin Pk: 31 ug/mL (ref 30–40)

## 2022-08-18 LAB — VITAMIN B12: Vitamin B-12: 265 pg/mL (ref 180–914)

## 2022-08-18 LAB — FOLATE: Folate: 9.1 ng/mL (ref >5.9)

## 2022-08-18 MED ORDER — VANCOMYCIN HCL 1250 MG/250ML IV SOLN
1250.0000 mg | INTRAVENOUS | Status: DC
  Administered 2022-08-18: 1250 mg via INTRAVENOUS
  Filled 2022-08-18: qty 250

## 2022-08-18 MED ORDER — VITAMIN B-12 1000 MCG PO TABS
1000.0000 ug | ORAL_TABLET | Freq: Every day | ORAL | Status: DC
  Administered 2022-08-18 – 2022-08-22 (×5): 1000 ug via ORAL
  Filled 2022-08-18 (×5): qty 1

## 2022-08-18 MED ORDER — LOPERAMIDE HCL 2 MG PO CAPS
2.0000 mg | ORAL_CAPSULE | Freq: Once | ORAL | Status: AC
  Administered 2022-08-18: 2 mg via ORAL
  Filled 2022-08-18: qty 1
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
## 2022-08-18 NOTE — Progress Notes (Signed)
Pharmacy Antibiotic Note  [name] is a 67 y.o. female admitted on 08/14/2022 with R ankle  wound infection .  Pharmacy has been consulted for Zosyn (piperacillin-tazobactam) and Vancomycin dosing.  AUC levels collected at steady state while on Vanc 1g Q24H.  VP 31; VT 7 Calculated AUC was 387. Subtherapeutic.  Plan: Adjust Vancomycin to 1250mg  IV q24h (eAUC 484, Cmax 41.6, Cmin 7.5) Alternative regimen of 750mg  Q12H resulted in AUC of 580, Cmin of 15.6, Cmax of 35.6. Given elevated AUC and Cmin, will continue with the 1250mg  Q24H dosing to reduce risk of AKI. Continue Zosyn Monitor daily CBC, temp, SCr, and for clinical signs of improvement  F/u cultures and de-escalate antibiotics as able   Height: 5' 1.5" (156.2 cm) Weight: 49.3 kg (108 lb 11 oz) IBW/kg (Calculated) : 48.95  Temp (24hrs), Avg:98.3 F (36.8 C), Min:97.9 F (36.6 C), Max:98.9 F (37.2 C)  Recent Labs  Lab 08/14/22 1457 08/15/22 0143 08/17/22 0323 08/17/22 2046 08/18/22 0116  WBC 11.0* 7.0 4.3  --  5.7  CREATININE 0.59 0.51 0.60  --   --   LATICACIDVEN 1.3 0.7  --   --   --   VANCOTROUGH  --   --   --  7*  --   VANCOPEAK  --   --   --   --  31    Estimated Creatinine Clearance: 55 mL/min (by C-G formula based on SCr of 0.6 mg/dL).    No Known Allergies  Antimicrobials this admission: Zosyn 7/19 >>  Vancomycin 7/19 >>   Dose adjustments this admission: N/A  Microbiology results: 7/19 BCx x2: ngtd @ 4d on 7/23 7/19 ankle superficial cx: pending staph aureus     Verdene Rio, PharmD PGY1 Pharmacy Resident

## 2022-08-18 NOTE — Progress Notes (Signed)
Mobility Specialist: Progress Note   08/18/22 1054  Mobility  Activity Ambulated with assistance in hallway  Level of Assistance Contact guard assist, steadying assist  Assistive Device Front wheel walker  Distance Ambulated (ft) 50 ft  Activity Response Tolerated well  Mobility Referral Yes  $Mobility charge 1 Mobility  Mobility Specialist Start Time (ACUTE ONLY) 1035  Mobility Specialist Stop Time (ACUTE ONLY) 1054  Mobility Specialist Time Calculation (min) (ACUTE ONLY) 19 min   Pt was agreeable to mobility session - received in bathroom. Had c/o R ankle pain; denies any SOB, fatigue, or dizziness. Required mostly standby and steadying assist throughout ambulation. Left in recliner with all needs met. Call bell in reach and chair alarm on.   Maurene Capes Mobility Specialist Please contact via SecureChat or Rehab office at [phone]

## 2022-08-18 NOTE — Progress Notes (Signed)
     [name] is a 67 y.o. female   Orthopaedic diagnosis: Status post ORIF right ankle fracture with lateral wound dehiscence and soft tissue infection with CT scan worrisome for osteomyelitis in the distal fibula  Subjective: Patient continues note improvement pain, swelling, erythema involving the right ankle.  She is in good spirits.  She is up in the chair visiting with family.  She understands the plan is now for surgery Thursday.  No fevers.  No new concerns today.  Objectyive: Vitals:   08/18/22 0516 08/18/22 0755  BP: 99/64 111/73  Pulse: 71 60  Resp: 18 18  Temp: 98.2 F (36.8 C) 98.9 F (37.2 C)  SpO2: 93% 95%     Exam: Awake and alert Respirations even and unlabored No acute distress  Right ankle shows interval improvement in erythema, tenderness, and swelling about the lateral aspect of the ankle. Persist with dehisced lateral ankle wound with surrounding erythema and drainage. Lateral aspect the ankle is tender to palpation. No other areas of tenderness or skin breakdown in the foot and ankle. Calf soft nontender. Warm and well-perfused distally. Tolerates range of motion in all planes.   Assessment: Status post ORIF right ankle fracture with lateral wound dehiscence and soft tissue infection with CT scan worrisome for osteomyelitis in the distal fibula  Plan: Plan to proceed with right ankle I&D, hardware removal, and saucerization type procedure to the distal fibula as indicated with Dr. Susa Simmonds on Thursday.  She will continue with IV antibiotics and pain control in the meantime.  Please keep n.p.o. past midnight on Wednesday.   Keshaun Dubey J. [place], PA-C

## 2022-08-19 ENCOUNTER — Telehealth: Payer: Self-pay | Admitting: *Deleted

## 2022-08-19 ENCOUNTER — Telehealth: Payer: Self-pay | Admitting: Family Medicine

## 2022-08-19 DIAGNOSIS — I10 Essential (primary) hypertension: Secondary | ICD-10-CM | POA: Diagnosis not present

## 2022-08-19 DIAGNOSIS — M86171 Other acute osteomyelitis, right ankle and foot: Secondary | ICD-10-CM | POA: Diagnosis not present

## 2022-08-19 DIAGNOSIS — S31000D Unspecified open wound of lower back and pelvis without penetration into retroperitoneum, subsequent encounter: Secondary | ICD-10-CM | POA: Diagnosis not present

## 2022-08-19 DIAGNOSIS — F900 Attention-deficit hyperactivity disorder, predominantly inattentive type: Secondary | ICD-10-CM | POA: Diagnosis not present

## 2022-08-19 LAB — CBC
HCT: 30.6 % — ABNORMAL LOW (ref 36.0–46.0)
Hemoglobin: 9.7 g/dL — ABNORMAL LOW (ref 12.0–15.0)
MCH: 29.2 pg (ref 26.0–34.0)
MCHC: 31.7 g/dL (ref 30.0–36.0)
MCV: 92.2 fL (ref 80.0–100.0)
Platelets: 191 10*3/uL (ref 150–400)
RBC: 3.32 MIL/uL — ABNORMAL LOW (ref 3.87–5.11)
RDW: 16 % — ABNORMAL HIGH (ref 11.5–15.5)
WBC: 4.3 10*3/uL (ref 4.0–10.5)
nRBC: 0 % (ref 0.0–0.2)

## 2022-08-19 LAB — BASIC METABOLIC PANEL
Anion gap: 8 (ref 5–15)
BUN: 7 mg/dL — ABNORMAL LOW (ref 8–23)
CO2: 24 mmol/L (ref 22–32)
Calcium: 8.5 mg/dL — ABNORMAL LOW (ref 8.9–10.3)
Chloride: 105 mmol/L (ref 98–111)
Creatinine, Ser: 0.58 mg/dL (ref 0.44–1.00)
GFR, Estimated: >60 mL/min (ref >60)
Glucose, Bld: 119 mg/dL — ABNORMAL HIGH (ref 70–99)
Potassium: 3.5 mmol/L (ref 3.5–5.1)
Sodium: 137 mmol/L (ref 135–145)

## 2022-08-19 LAB — CULTURE, BLOOD (ROUTINE X 2): Culture: NO GROWTH

## 2022-08-19 LAB — AEROBIC CULTURE W GRAM STAIN (SUPERFICIAL SPECIMEN)

## 2022-08-19 MED ORDER — CEFAZOLIN SODIUM-DEXTROSE 2-4 GM/100ML-% IV SOLN
2.0000 g | Freq: Three times a day (TID) | INTRAVENOUS | Status: DC
  Administered 2022-08-19 – 2022-08-22 (×10): 2 g via INTRAVENOUS
  Filled 2022-08-19 (×10): qty 100

## 2022-08-19 NOTE — Progress Notes (Signed)
   08/19/22 1534  Mobility  Activity Ambulated with assistance in hallway  Level of Assistance Contact guard assist, steadying assist  Assistive Device Other (Comment) (IV Pole)  Distance Ambulated (ft) 75 ft  Activity Response Tolerated well  Mobility Referral Yes  $Mobility charge 1 Mobility  Mobility Specialist Start Time (ACUTE ONLY) 1515  Mobility Specialist Stop Time (ACUTE ONLY) 1535  Mobility Specialist Time Calculation (min) (ACUTE ONLY) 20 min   Mobility Specialist: Progress Note  Pt received standing in restroom, agreeable to mobility session. CG throughout using IV Pole. Pt walked with limp complaining of posterior right ankle pain when she takes sharp turns. Took one break (<22min) to look out the window. After session, pt returned to chair with all needs met. Call bell within reach.   Barnie Mort Mobility Specialist Please contact via SecureChat or Rehab office at [phone]

## 2022-08-19 NOTE — Plan of Care (Signed)
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
## 2022-08-19 NOTE — Telephone Encounter (Signed)
FYI Pt is calling to let md know she will have surgery tomorrow due to right ankle issues and had imaging and there is infection in the bone (osteomyelitis)
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
## 2022-08-19 NOTE — Telephone Encounter (Signed)
Received on (08/14/22) via of fax Physician Orders from [facility].  Requesting signature,date,and return.  Given to provider to sign.    Physician Orders signed,dated, and faxed back to [facility].  Confirmation received and copy scanned into the chart.//AB/CMA

## 2022-08-19 NOTE — Plan of Care (Signed)
  Problem: Health Behavior/Discharge Planning: Goal: Ability to manage health-related needs will improve Outcome: Progressing   Problem: Activity: Goal: Risk for activity intolerance will decrease Outcome: Progressing   Problem: Nutrition: Goal: Adequate nutrition will be maintained Outcome: Progressing   

## 2022-08-19 NOTE — Progress Notes (Signed)
     [name] is a 68 y.o. female   Orthopaedic diagnosis: Status post ORIF right ankle fracture with lateral wound dehiscence and soft tissue infection with CT scan worrisome for osteomyelitis in the distal fibula  Subjective: Patient ready for surgery tomorrow.  Continues to note improvement pain, swelling, and erythema involving the right ankle with IV antibiotics. She does note some drainage through the dressing. She is ambulating but notes increased pain with that. No fevers.  No new concerns today.  Objectyive: Vitals:   08/19/22 0426 08/19/22 0745  BP: 100/61 109/67  Pulse: (!) 57 65  Resp: 16 18  Temp: 98.3 F (36.8 C) 98.4 F (36.9 C)  SpO2: 95% 98%    Exam: Awake and alert Respirations even and unlabored No acute distress   Right ankle shows interval improvement in erythema, tenderness, and swelling about the lateral aspect of the ankle. Persist with dehisced lateral ankle wound distally adjacent to the lateral malleolus with surrounding erythema and drainage. Lateral aspect the ankle is tender to palpation. No other areas of tenderness or skin breakdown in the foot and ankle. Calf soft nontender. Warm and well-perfused distally. Tolerates range of motion in all planes.    Assessment: Status post ORIF right ankle fracture with lateral wound dehiscence and soft tissue infection with CT scan worrisome for osteomyelitis in the distal fibula   Plan: Patient with notable improvement soft tissue swelling and erythema with IV antibiotics.  She persists with an open wound and retained hardware in the setting of CT scan worrisome for osteomyelitis involving the distal fibula. Plan to proceed with right ankle I&D, hardware removal, and saucerization type procedure to the distal fibula as indicated with Dr. Susa Simmonds on Thursday.  She will continue with IV antibiotics and pain control in the meantime. Patient is agreeable and understanding.  Please keep n.p.o. past midnight  tonight.   Delrose Rohwer J. [place], PA-C

## 2022-08-19 NOTE — Consult Note (Signed)
[facility]       Reason for Consult:  osteomyelitis   Referring Physician: Dr. Marland Mcalpine  Principal Problem:   Osteomyelitis of right ankle [facility]) Active Problems:   ADHD (attention deficit hyperactivity disorder), inattentive type   Open wound of lower back and pelvis w/o penentrat into retroperitoneum, subsequent encounter   HTN (hypertension)   Cellulitis of right ankle    amphetamine-dextroamphetamine  20 mg Oral [id number]   celecoxib  200 mg Oral BID   vitamin B-12  1,000 mcg Oral Daily   cycloSPORINE  2 drop Both Eyes BID   enoxaparin (LOVENOX) injection  40 mg Subcutaneous Daily   oxyCODONE  20 mg Oral TID   polyethylene glycol  17 g Oral Daily   pregabalin  300 mg Oral BID   senna-docusate  2 tablet Oral BID   valACYclovir  1,000 mg Oral Daily    Recommendations: Will change to cefazolin based on cultures  Assessment: She has a bone infection at the site of her   HPI: [name] is a 67 y.o. female with a history of ORIF of a right ankle fracture complicated by lateral wound dehiscence and now CT scan c/w osteomyelitis.  She had the ORIF in January with removal of part of the hardware in March due to dehiscence.  She came in to the ED with worsening right ankle pain and CT with osteomyelitis.  Plan for surgery tomorrow with complete hardware removal.  Pansensitive Staph aureus in culture.    Review of Systems:  Constitutional: negative for fevers and chills All other systems reviewed and are negative    Past Medical History:  Diagnosis Date   ADHD (attention deficit hyperactivity disorder)    Anemia    Anxiety    ARDS (adult respiratory distress syndrome) (HCC) 2007   Arthritis    Depression    Gallstones 01/07/2018   Ganglion cyst of dorsum of right wrist    Headache    Hepatitis    2004 non detectable now   Hypertension    MRSA (methicillin resistant Staphylococcus aureus) 2007   Osteoporosis    Pneumonia 2009    Prosthetic eye globe    right eye    Raynaud's disease     Social History   Tobacco Use   Smoking status: Never   Smokeless tobacco: Never  Vaping Use   Vaping status: Never Used  Substance Use Topics   Alcohol use: Not Currently    Comment: occ   Drug use: No    Family History  Problem Relation Age of Onset   Breast cancer Maternal Aunt 60   Colon cancer Neg Hx     No Known Allergies  Physical Exam: Constitutional: in no apparent distress  Vitals:   08/19/22 0426 08/19/22 0745  BP: 100/61 109/67  Pulse: (!) 57 65  Resp: 16 18  Temp: 98.3 F (36.8 C) 98.4 F (36.9 C)  SpO2: 95% 98%   EYES: anicteric Respiratory: normal respiratory effort  Lab Results  Component Value Date   WBC 4.3 08/19/2022   HGB 9.7 (L) 08/19/2022   HCT 30.6 (L) 08/19/2022   MCV 92.2 08/19/2022   PLT 191 08/19/2022    Lab Results  Component Value Date   CREATININE 0.58 08/19/2022   BUN 7 (L) 08/19/2022   NA 137 08/19/2022   K 3.5 08/19/2022   CL 105 08/19/2022   CO2 24 08/19/2022    Lab Results  Component Value  Date   ALT 11 08/14/2022   AST 22 08/14/2022   ALKPHOS 93 08/14/2022     Microbiology: Recent Results (from the past 240 hour(s))  Blood Cultures x 2 sites     Status: None   Collection Time: 08/14/22  3:03 PM   Specimen: BLOOD  Result Value Ref Range Status   Specimen Description BLOOD LEFT ANTECUBITAL  Final   Special Requests   Final    BOTTLES DRAWN AEROBIC AND ANAEROBIC Blood Culture results may not be optimal due to an excessive volume of blood received in culture bottles   Culture   Final    NO GROWTH 5 DAYS Performed at [facility], [address] [place], [place] [address]    Report Status 08/19/2022 FINAL  Final  Blood Cultures x 2 sites     Status: None   Collection Time: 08/14/22  8:35 PM   Specimen: BLOOD  Result Value Ref Range Status   Specimen Description BLOOD RIGHT ANTECUBITAL  Final   Special Requests   Final    BOTTLES DRAWN  AEROBIC AND ANAEROBIC Blood Culture adequate volume   Culture   Final    NO GROWTH 5 DAYS Performed at [facility], [address] [place], [place] [address]    Report Status 08/19/2022 FINAL  Final  Aerobic Culture w Gram Stain (superficial specimen)     Status: None   Collection Time: 08/14/22 10:12 PM   Specimen: Ankle  Result Value Ref Range Status   Specimen Description ANKLE  Final   Special Requests NONE  Final   Gram Stain   Final    MODERATE WBC PRESENT, PREDOMINANTLY MONONUCLEAR FEW GRAM POSITIVE COCCI IN PAIRS FEW GRAM POSITIVE COCCI IN CLUSTERS Performed at [facility], [address], [place], [place] [address]    Culture RARE STAPHYLOCOCCUS AUREUS  Final   Report Status 08/19/2022 FINAL  Final   Organism ID, Bacteria STAPHYLOCOCCUS AUREUS  Final      Susceptibility   Staphylococcus aureus - MIC*    CIPROFLOXACIN <=0.5 SENSITIVE Sensitive     ERYTHROMYCIN <=0.25 SENSITIVE Sensitive     GENTAMICIN <=0.5 SENSITIVE Sensitive     OXACILLIN <=0.25 SENSITIVE Sensitive     TETRACYCLINE <=1 SENSITIVE Sensitive     VANCOMYCIN 1 SENSITIVE Sensitive     TRIMETH/SULFA <=10 SENSITIVE Sensitive     CLINDAMYCIN <=0.25 SENSITIVE Sensitive     RIFAMPIN <=0.5 SENSITIVE Sensitive     Inducible Clindamycin NEGATIVE Sensitive     LINEZOLID 2 SENSITIVE Sensitive     * RARE STAPHYLOCOCCUS AUREUS  Surgical pcr screen     Status: None   Collection Time: 08/17/22  9:47 PM   Specimen: Nasal Mucosa; Nasal Swab  Result Value Ref Range Status   MRSA, PCR NEGATIVE NEGATIVE Final   Staphylococcus aureus NEGATIVE NEGATIVE Final    Comment: (NOTE) The Xpert SA Assay (FDA approved for NASAL specimens in patients 16 years of age and older), is one component of a comprehensive surveillance program. It is not intended to diagnose infection nor to guide or monitor treatment. Performed at [facility], [address], [place], [place] [address]     Gardiner Barefoot,  MD [email or web address] 08/19/2022, 2:44 PM

## 2022-08-20 ENCOUNTER — Inpatient Hospital Stay (HOSPITAL_COMMUNITY): Payer: Commercial Managed Care - PPO

## 2022-08-20 ENCOUNTER — Encounter (HOSPITAL_COMMUNITY)
Admission: EM | Disposition: A | Discharge status: Home Health | Payer: Self-pay | Source: Home / Self Care | Attending: Internal Medicine

## 2022-08-20 ENCOUNTER — Inpatient Hospital Stay (HOSPITAL_COMMUNITY): Payer: Commercial Managed Care - PPO | Admitting: Anesthesiology

## 2022-08-20 ENCOUNTER — Other Ambulatory Visit: Payer: Self-pay

## 2022-08-20 ENCOUNTER — Encounter (HOSPITAL_COMMUNITY): Payer: Self-pay | Admitting: Internal Medicine

## 2022-08-20 DIAGNOSIS — I10 Essential (primary) hypertension: Secondary | ICD-10-CM

## 2022-08-20 DIAGNOSIS — S31000D Unspecified open wound of lower back and pelvis without penetration into retroperitoneum, subsequent encounter: Secondary | ICD-10-CM | POA: Diagnosis not present

## 2022-08-20 DIAGNOSIS — J15212 Pneumonia due to Methicillin resistant Staphylococcus aureus: Secondary | ICD-10-CM | POA: Diagnosis not present

## 2022-08-20 DIAGNOSIS — M869 Osteomyelitis, unspecified: Secondary | ICD-10-CM | POA: Diagnosis not present

## 2022-08-20 DIAGNOSIS — F900 Attention-deficit hyperactivity disorder, predominantly inattentive type: Secondary | ICD-10-CM | POA: Diagnosis not present

## 2022-08-20 DIAGNOSIS — F418 Other specified anxiety disorders: Secondary | ICD-10-CM | POA: Diagnosis not present

## 2022-08-20 DIAGNOSIS — M86171 Other acute osteomyelitis, right ankle and foot: Secondary | ICD-10-CM | POA: Diagnosis not present

## 2022-08-20 HISTORY — PX: I & D EXTREMITY: SHX5045

## 2022-08-20 HISTORY — PX: HARDWARE REMOVAL: SHX979

## 2022-08-20 LAB — CBC WITH DIFFERENTIAL/PLATELET
Abs Immature Granulocytes: 0.06 10*3/uL (ref 0.00–0.07)
Basophils Absolute: 0 10*3/uL (ref 0.0–0.1)
Basophils Relative: 1 %
Eosinophils Relative: 10 %
HCT: 30.5 % — ABNORMAL LOW (ref 36.0–46.0)
Hemoglobin: 9.6 g/dL — ABNORMAL LOW (ref 12.0–15.0)
Immature Granulocytes: 2 %
Lymphocytes Relative: 32 %
Lymphs Abs: 1.3 10*3/uL (ref 0.7–4.0)
MCHC: 31.5 g/dL (ref 30.0–36.0)
MCV: 91.9 fL (ref 80.0–100.0)
Monocytes Absolute: 0.7 10*3/uL (ref 0.1–1.0)
Monocytes Relative: 17 %
Neutro Abs: 1.5 10*3/uL — ABNORMAL LOW (ref 1.7–7.7)
Neutrophils Relative %: 38 %
Platelets: 203 10*3/uL (ref 150–400)
RBC: 3.32 MIL/uL — ABNORMAL LOW (ref 3.87–5.11)
RDW: 16 % — ABNORMAL HIGH (ref 11.5–15.5)
WBC: 4 10*3/uL (ref 4.0–10.5)
nRBC: 0 % (ref 0.0–0.2)

## 2022-08-20 LAB — AEROBIC/ANAEROBIC CULTURE W GRAM STAIN (SURGICAL/DEEP WOUND): Gram Stain: NONE SEEN

## 2022-08-20 LAB — COMPREHENSIVE METABOLIC PANEL
ALT: 12 U/L (ref 0–44)
AST: 16 U/L (ref 15–41)
Albumin: 2.5 g/dL — ABNORMAL LOW (ref 3.5–5.0)
Anion gap: 10 (ref 5–15)
CO2: 25 mmol/L (ref 22–32)
Calcium: 8.6 mg/dL — ABNORMAL LOW (ref 8.9–10.3)
Chloride: 100 mmol/L (ref 98–111)
Creatinine, Ser: 0.63 mg/dL (ref 0.44–1.00)
GFR, Estimated: >60 mL/min (ref >60)
Glucose, Bld: 100 mg/dL — ABNORMAL HIGH (ref 70–99)
Potassium: 3.4 mmol/L — ABNORMAL LOW (ref 3.5–5.1)
Sodium: 135 mmol/L (ref 135–145)
Total Protein: 6.6 g/dL (ref 6.5–8.1)

## 2022-08-20 LAB — PHOSPHORUS: Phosphorus: 4.9 mg/dL — ABNORMAL HIGH (ref 2.5–4.6)

## 2022-08-20 SURGERY — IRRIGATION AND DEBRIDEMENT EXTREMITY
Anesthesia: General | Site: Ankle | Laterality: Right

## 2022-08-20 MED ORDER — OXYCODONE HCL 5 MG/5ML PO SOLN: 5.0000 mg | Freq: Once | ORAL | Status: DC | PRN

## 2022-08-20 MED ORDER — DEXAMETHASONE SODIUM PHOSPHATE 10 MG/ML IJ SOLN
INTRAMUSCULAR | Status: AC
  Filled 2022-08-20: qty 1

## 2022-08-20 MED ORDER — PROPOFOL 10 MG/ML IV BOLUS
INTRAVENOUS | Status: AC
  Filled 2022-08-20: qty 20

## 2022-08-20 MED ORDER — FENTANYL CITRATE (PF) 100 MCG/2ML IJ SOLN
INTRAMUSCULAR | Status: AC
  Filled 2022-08-20: qty 2

## 2022-08-20 MED ORDER — ONDANSETRON HCL 4 MG/2ML IJ SOLN
INTRAMUSCULAR | Status: AC
  Filled 2022-08-20: qty 2

## 2022-08-20 MED ORDER — POTASSIUM CHLORIDE 10 MEQ/100ML IV SOLN
INTRAVENOUS | Status: AC
  Filled 2022-08-20: qty 100

## 2022-08-20 MED ORDER — ORAL CARE MOUTH RINSE: 15.0000 mL | Freq: Once | OROMUCOSAL | Status: AC

## 2022-08-20 MED ORDER — HYDROMORPHONE HCL 1 MG/ML IJ SOLN
INTRAMUSCULAR | Status: AC
  Filled 2022-08-20: qty 1

## 2022-08-20 MED ORDER — POTASSIUM CHLORIDE CRYS ER 20 MEQ PO TBCR
40.0000 meq | EXTENDED_RELEASE_TABLET | Freq: Once | ORAL | Status: AC
  Administered 2022-08-20: 40 meq via ORAL
  Filled 2022-08-20 (×2): qty 2

## 2022-08-20 MED ORDER — LIDOCAINE 2% (20 MG/ML) 5 ML SYRINGE
INTRAMUSCULAR | Status: DC | PRN
  Administered 2022-08-20: 50 mg via INTRAVENOUS

## 2022-08-20 MED ORDER — PHENYLEPHRINE HCL-NACL 20-0.9 MG/250ML-% IV SOLN
INTRAVENOUS | Status: DC | PRN
  Administered 2022-08-20: 40 ug/min via INTRAVENOUS

## 2022-08-20 MED ORDER — FENTANYL CITRATE (PF) 250 MCG/5ML IJ SOLN
INTRAMUSCULAR | Status: DC | PRN
  Administered 2022-08-20 (×2): 50 ug via INTRAVENOUS
  Administered 2022-08-20 (×2): 25 ug via INTRAVENOUS
  Administered 2022-08-20 (×2): 50 ug via INTRAVENOUS

## 2022-08-20 MED ORDER — MIDAZOLAM HCL 2 MG/2ML IJ SOLN
INTRAMUSCULAR | Status: DC | PRN
  Administered 2022-08-20: 2 mg via INTRAVENOUS

## 2022-08-20 MED ORDER — ONDANSETRON HCL 4 MG/2ML IJ SOLN
INTRAMUSCULAR | Status: DC | PRN
Start: 2022-08-20 — End: 2022-08-20
  Administered 2022-08-20: 4 mg via INTRAVENOUS

## 2022-08-20 MED ORDER — CHLORHEXIDINE GLUCONATE 0.12 % MT SOLN: 15.0000 mL | Freq: Once | OROMUCOSAL | Status: AC

## 2022-08-20 MED ORDER — PROPOFOL 10 MG/ML IV BOLUS
INTRAVENOUS | Status: DC | PRN
  Administered 2022-08-20: 100 mg via INTRAVENOUS

## 2022-08-20 MED ORDER — CHLORHEXIDINE GLUCONATE 0.12 % MT SOLN
OROMUCOSAL | Status: AC
  Administered 2022-08-20: 15 mL via OROMUCOSAL
  Filled 2022-08-20: qty 15

## 2022-08-20 MED ORDER — ONDANSETRON HCL 4 MG/2ML IJ SOLN: 4.0000 mg | Freq: Four times a day (QID) | INTRAMUSCULAR | Status: DC | PRN

## 2022-08-20 MED ORDER — FENTANYL CITRATE (PF) 100 MCG/2ML IJ SOLN
25.0000 ug | INTRAMUSCULAR | Status: DC | PRN
  Administered 2022-08-20 (×3): 50 ug via INTRAVENOUS

## 2022-08-20 MED ORDER — FENTANYL CITRATE (PF) 250 MCG/5ML IJ SOLN
INTRAMUSCULAR | Status: AC
  Filled 2022-08-20: qty 5

## 2022-08-20 MED ORDER — MIDAZOLAM HCL 2 MG/2ML IJ SOLN
INTRAMUSCULAR | Status: AC
  Filled 2022-08-20: qty 2

## 2022-08-20 MED ORDER — OXYCODONE HCL 5 MG PO TABS: 5.0000 mg | ORAL_TABLET | Freq: Once | ORAL | Status: DC | PRN

## 2022-08-20 MED ORDER — LIDOCAINE 2% (20 MG/ML) 5 ML SYRINGE
INTRAMUSCULAR | Status: AC
  Filled 2022-08-20: qty 5

## 2022-08-20 MED ORDER — POTASSIUM CHLORIDE 10 MEQ/100ML IV SOLN
10.0000 meq | INTRAVENOUS | Status: DC
  Administered 2022-08-20: 10 meq via INTRAVENOUS
  Filled 2022-08-20 (×2): qty 100

## 2022-08-20 MED ORDER — LACTATED RINGERS IV SOLN: INTRAVENOUS | Status: DC

## 2022-08-20 MED ORDER — 0.9 % SODIUM CHLORIDE (POUR BTL) OPTIME
TOPICAL | Status: DC | PRN
  Administered 2022-08-20: 1000 mL

## 2022-08-20 MED ORDER — KETOROLAC TROMETHAMINE 15 MG/ML IJ SOLN
15.0000 mg | Freq: Four times a day (QID) | INTRAMUSCULAR | Status: DC | PRN
  Administered 2022-08-20 – 2022-08-22 (×6): 15 mg via INTRAVENOUS
  Filled 2022-08-20 (×6): qty 1

## 2022-08-20 MED ORDER — SODIUM CHLORIDE 0.9 % IR SOLN
Status: DC | PRN
  Administered 2022-08-20: 3000 mL

## 2022-08-20 MED ORDER — HYDROMORPHONE HCL 1 MG/ML IJ SOLN
0.2500 mg | INTRAMUSCULAR | Status: DC | PRN
  Administered 2022-08-20 (×2): 0.5 mg via INTRAVENOUS

## 2022-08-20 SURGICAL SUPPLY — 92 items
APL PRP STRL LF DISP 70% ISPRP (MISCELLANEOUS)
APL SKNCLS STERI-STRIP NONHPOA (GAUZE/BANDAGES/DRESSINGS)
BAG COUNTER SPONGE SURGICOUNT (BAG) IMPLANT
BAG SPNG CNTER NS LX DISP (BAG)
BANDAGE ESMARK 6X9 LF (GAUZE/BANDAGES/DRESSINGS) IMPLANT
BENZOIN TINCTURE PRP APPL 2/3 (GAUZE/BANDAGES/DRESSINGS) IMPLANT
BLADE SURG 15 STRL LF DISP TIS (BLADE) ×2 IMPLANT
BLADE SURG 15 STRL SS (BLADE) ×2
BNDG CMPR 6 X 5 YARDS HK CLSR (GAUZE/BANDAGES/DRESSINGS) ×2
BNDG CMPR 9X4 STRL LF SNTH (GAUZE/BANDAGES/DRESSINGS)
BNDG CMPR 9X6 STRL LF SNTH (GAUZE/BANDAGES/DRESSINGS)
BNDG CMPR MED 10X6 ELC LF (GAUZE/BANDAGES/DRESSINGS) ×1
BNDG COHESIVE 4X5 TAN STRL (GAUZE/BANDAGES/DRESSINGS) IMPLANT
BNDG ELASTIC 4X5.8 VLCR STR LF (GAUZE/BANDAGES/DRESSINGS) IMPLANT
BNDG ELASTIC 6INX 5YD STR LF (GAUZE/BANDAGES/DRESSINGS) IMPLANT
BNDG ELASTIC 6X10 VLCR STRL LF (GAUZE/BANDAGES/DRESSINGS) ×1 IMPLANT
BNDG ELASTIC 6X5.8 VLCR STR LF (GAUZE/BANDAGES/DRESSINGS) IMPLANT
BNDG ESMARK 4X9 LF (GAUZE/BANDAGES/DRESSINGS) IMPLANT
BNDG ESMARK 6X9 LF (GAUZE/BANDAGES/DRESSINGS)
BNDG GAUZE DERMACEA FLUFF 4 (GAUZE/BANDAGES/DRESSINGS) ×1 IMPLANT
BNDG GZE DERMACEA 4 6PLY (GAUZE/BANDAGES/DRESSINGS) ×2
CHLORAPREP W/TINT 26 (MISCELLANEOUS) ×1 IMPLANT
CNTNR URN SCR LID CUP LEK RST (MISCELLANEOUS) IMPLANT
CONT SPEC 4OZ STRL OR WHT (MISCELLANEOUS) ×1
COVER SURGICAL LIGHT HANDLE (MISCELLANEOUS) ×2 IMPLANT
CUFF TOURN SGL QUICK 34 (TOURNIQUET CUFF) ×1
CUFF TRNQT CYL 34X4.125X (TOURNIQUET CUFF) ×1 IMPLANT
DRAPE C-ARM 42X120 X-RAY (DRAPES) IMPLANT
DRAPE IMP U-DRAPE 54X76 (DRAPES) ×1 IMPLANT
DRAPE OEC MINIVIEW 54X84 (DRAPES) ×1 IMPLANT
DRAPE U-SHAPE 47X51 STRL (DRAPES) ×1 IMPLANT
DRESSING PEEL AND PLC PRVNA 13 (GAUZE/BANDAGES/DRESSINGS) IMPLANT
DRSG PEEL AND PLACE PREVENA 13 (GAUZE/BANDAGES/DRESSINGS) ×1
DRSG XEROFORM 1X8 (GAUZE/BANDAGES/DRESSINGS) IMPLANT
DURAPREP 26ML APPLICATOR (WOUND CARE) ×1 IMPLANT
ELECT REM PT RETURN 9FT ADLT (ELECTROSURGICAL) ×1
ELECTRODE REM PT RTRN 9FT ADLT (ELECTROSURGICAL) ×1 IMPLANT
GAUZE PAD ABD 8X10 STRL (GAUZE/BANDAGES/DRESSINGS) IMPLANT
GAUZE SPONGE 4X4 12PLY STRL (GAUZE/BANDAGES/DRESSINGS) ×1 IMPLANT
GAUZE SPONGE 4X4 12PLY STRL LF (GAUZE/BANDAGES/DRESSINGS) ×1 IMPLANT
GAUZE XEROFORM 1X8 LF (GAUZE/BANDAGES/DRESSINGS) ×1 IMPLANT
GLOVE BIOGEL M STRL SZ7.5 (GLOVE) ×1 IMPLANT
GLOVE BIOGEL PI IND STRL 8 (GLOVE) ×1 IMPLANT
GLOVE INDICATOR 8.0 STRL GRN (GLOVE) ×1 IMPLANT
GLOVE SRG 8 PF TXTR STRL LF DI (GLOVE) ×1 IMPLANT
GLOVE SURG UNDER POLY LF SZ8 (GLOVE) ×1
GOWN STRL REUS W/ TWL LRG LVL3 (GOWN DISPOSABLE) ×1 IMPLANT
GOWN STRL REUS W/ TWL XL LVL3 (GOWN DISPOSABLE) ×1 IMPLANT
GOWN STRL REUS W/TWL LRG LVL3 (GOWN DISPOSABLE) ×1
GOWN STRL REUS W/TWL XL LVL3 (GOWN DISPOSABLE) ×1
HANDPIECE INTERPULSE COAX TIP (DISPOSABLE) ×1
KIT BASIN OR (CUSTOM PROCEDURE TRAY) ×1 IMPLANT
KIT DRSG PREVENA PLUS 7DAY 125 (MISCELLANEOUS) IMPLANT
KIT TURNOVER KIT B (KITS) ×1 IMPLANT
MANIFOLD NEPTUNE II (INSTRUMENTS) ×1 IMPLANT
NDL 22X1.5 STRL (OR ONLY) (MISCELLANEOUS) IMPLANT
NDL HYPO 25X1 1.5 SAFETY (NEEDLE) IMPLANT
NEEDLE 22X1.5 STRL (OR ONLY) (MISCELLANEOUS)
NEEDLE HYPO 25X1 1.5 SAFETY (NEEDLE)
NS IRRIG 1000ML POUR BTL (IV SOLUTION) ×1 IMPLANT
PACK ORTHO EXTREMITY (CUSTOM PROCEDURE TRAY) ×1 IMPLANT
PAD ARMBOARD 7.5X6 YLW CONV (MISCELLANEOUS) ×2 IMPLANT
PAD CAST 4YDX4 CTTN HI CHSV (CAST SUPPLIES) ×1 IMPLANT
PADDING CAST COTTON 4X4 STRL (CAST SUPPLIES)
PADDING CAST SYNTHETIC 4X4 STR (CAST SUPPLIES) IMPLANT
SET HNDPC FAN SPRY TIP SCT (DISPOSABLE) ×1 IMPLANT
SHEET MEDIUM DRAPE 40X70 STRL (DRAPES) ×1 IMPLANT
SLEEVE SCD COMPRESS KNEE MED (STOCKING) ×1 IMPLANT
SOL PREP POV-IOD 4OZ 10% (MISCELLANEOUS) IMPLANT
SPIKE FLUID TRANSFER (MISCELLANEOUS) IMPLANT
SPLINT PLASTER CAST XFAST 5X30 (CAST SUPPLIES) IMPLANT
SPONGE T-LAP 18X18 ~~LOC~~+RFID (SPONGE) IMPLANT
STOCKINETTE 6 STRL (DRAPES) ×1 IMPLANT
STOCKINETTE IMPERVIOUS 9X36 MD (GAUZE/BANDAGES/DRESSINGS) IMPLANT
STRIP CLOSURE SKIN 1/2X4 (GAUZE/BANDAGES/DRESSINGS) IMPLANT
SUCTION TUBE FRAZIER 10FR DISP (SUCTIONS) IMPLANT
SUT ETHILON 2 0 FS 18 (SUTURE) IMPLANT
SUT ETHILON 3 0 FSL (SUTURE) IMPLANT
SUT ETHILON 3 0 PS 1 (SUTURE) ×1 IMPLANT
SUT MNCRL AB 3-0 PS2 18 (SUTURE) IMPLANT
SUT MNCRL AB 3-0 PS2 27 (SUTURE) IMPLANT
SUT PDS AB 2-0 CT2 27 (SUTURE) ×1 IMPLANT
SUT VIC AB 2-0 SH 27 (SUTURE) ×1
SUT VIC AB 2-0 SH 27XBRD (SUTURE) IMPLANT
SUT VIC AB 3-0 FS2 27 (SUTURE) IMPLANT
SYR BULB EAR ULCER 3OZ GRN STR (SYRINGE) ×1 IMPLANT
SYR CONTROL 10ML LL (SYRINGE) IMPLANT
TOWEL GREEN STERILE FF (TOWEL DISPOSABLE) ×2 IMPLANT
TUBE CONNECTING 12X1/4 (SUCTIONS) ×1 IMPLANT
TUBE CONNECTING 20X1/4 (TUBING) IMPLANT
UNDERPAD 30X36 HEAVY ABSORB (UNDERPADS AND DIAPERS) ×1 IMPLANT
YANKAUER SUCT BULB TIP NO VENT (SUCTIONS) ×1 IMPLANT

## 2022-08-20 NOTE — Transfer of Care (Signed)
Immediate Anesthesia Transfer of Care Note  Patient: [name]  Procedure(s) Performed: IRRIGATION AND DEBRIDEMENT EXTREMITY WITH WOUND VAC PLACEMENT (Right: Ankle) HARDWARE REMOVAL (Right: Ankle)  Patient Location: PACU  Anesthesia Type:General  Level of Consciousness: awake, alert , and oriented  Airway & Oxygen Therapy: Patient Spontanous Breathing and Patient connected to nasal cannula oxygen  Post-op Assessment: Report given to RN, Post -op Vital signs reviewed and stable, and Patient moving all extremities X 4  Post vital signs: Reviewed and stable  Last Vitals:  Vitals Value Taken Time  BP 164/72 08/20/22 1216  Temp    Pulse 78 08/20/22 1222  Resp 12 08/20/22 1222  SpO2 98 % 08/20/22 1222  Vitals shown include unfiled device data.  Last Pain:  Vitals:   08/20/22 1017  TempSrc: Oral  PainSc: 7       Patients Stated Pain Goal: 3 (08/20/22 1017)  Complications: No notable events documented.

## 2022-08-20 NOTE — Progress Notes (Signed)
Spoke with Dr. Marland Mcalpine. Patient will be switched from IV potassium to PO potassium.   Hermina Barters, RN

## 2022-08-20 NOTE — Progress Notes (Signed)
     [name] is a 67 y.o. female   Orthopaedic diagnosis: Status post open treatment right trimalleolar ankle fracture now with slow to heal wound and concern for deep infection  Subjective: Patient seen in the preoperative holding area.  She states the wound has improved significantly with IV antibiotics.  She would still like to have the hardware removed which is reasonable.  She denies worsening pain symptoms.  No fevers or chills.  Objectyive: Vitals:   08/20/22 0341 08/20/22 0747  BP: 108/60 122/75  Pulse: 72 70  Resp:  17  Temp: 98.1 F (36.7 C) 97.8 F (36.6 C)  SpO2: 94% 95%     Exam: Awake and alert Respirations even and unlabored No acute distress  Right ankle with small area of exposed beefy red base about the distal fibular incision.  Some surrounding erythema.  No gross purulence noted.  She is able to tolerate ankle range of motion.  Foot is warm and well-perfused.  Assessment: Right ankle retained deep orthopedic hardware in the setting of recurrent cellulitis and slow to heal fibular wound   Plan: Irrigation debridement of her wound with deep orthopedic hardware removal along the posterior and medial aspect of the ankle.  Will also plan for saucerization of the fibula as there was concern for infection within the bone.  We discussed the risk, benefits and alternatives of surgery which include but are not limited to wound healing complications, infection, need for further surgery and damage to surrounding structures.  She would like to proceed with surgery as above.   Nicki [place], MD

## 2022-08-20 NOTE — Anesthesia Procedure Notes (Signed)
Procedure Name: LMA Insertion Date/Time: 08/20/2022 10:42 AM  Performed by: Quentin Ore, CRNAPre-anesthesia Checklist: Patient identified, Emergency Drugs available, Suction available and Patient being monitored Patient Re-evaluated:Patient Re-evaluated prior to induction Oxygen Delivery Method: Circle system utilized Preoxygenation: Pre-oxygenation with 100% oxygen Induction Type: IV induction LMA: LMA inserted LMA Size: 4.0 Number of attempts: 1 Placement Confirmation: positive ETCO2 and breath sounds checked- equal and bilateral Tube secured with: Tape Dental Injury: Teeth and Oropharynx as per pre-operative assessment

## 2022-08-20 NOTE — Plan of Care (Signed)
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
## 2022-08-20 NOTE — Op Note (Signed)
Loriel Diehl [facility] female 67 y.o. 08/20/2022  PreOperative Diagnosis: Right ankle deep soft tissue infection Retained right ankle orthopedic hardware Right fibular osteomyelitis  PostOperative Diagnosis: Right ankle deep soft tissue infection Right ankle retained orthopedic hardware, posterior Right ankle retained orthopedic hardware, medial Right fibular osteomyelitis Peroneus longus tendon tear  PROCEDURE: Irrigation and debridement of right ankle deep soft tissue infection Right ankle deep orthopedic hardware removal, posterior Right ankle deep orthopedic hardware removal, medial through separate incision Right fibula saucerization for osteomyelitis Peroneus longus and deep tendon transfer to peroneus brevis Application of negative pressure wound VAC  SURGEON: Dub Mikes, MD  ASSISTANT: Jesse [place], PA-C was necessary for patient positioning, prep, drape, assistance with the procedure, closure and splinting  ANESTHESIA: General  FINDINGS: See below  IMPLANTS: none  INDICATIONS:66 y.o. female underwent open treatment of trimalleolar ankle fracture in January 2024.  She had wound dehiscence and exposed lateral plate that was removed.  Then she had slow to heal wound and was open to [facility] sent to plastic surgery for coverage.  She underwent 2 procedures with plastic surgery but had continued swelling and slow to heal wound.  Subsequently she developed significant swelling, drainage and signs of infection.  CT scan demonstrated concern for osteomyelitis with retained orthopedic hardware around the site of infection.  She was indicated for surgery.   patient understood the risks, benefits and alternatives to surgery which include but are not limited to wound healing complications, infection, nonunion, malunion, need for further surgery as well as damage to surrounding structures. They also understood the potential for continued pain in that there were no guarantees of  acceptable outcome After weighing these risks the patient opted to proceed with surgery.  PROCEDURE: Patient was identified in the preoperative holding area.  The right ankle was marked by myself.  Consent was signed by myself and the patient.  Block was performed by anesthesia in the preoperative holding area.  Patient was taken to the operative suite and placed supine on the operative table.  General anesthesia was induced without difficulty. Bump was placed under the operative hip and bone foam was used.  All bony prominences were well padded.  Tourniquet was placed on the operative thigh.  Preoperative antibiotics were given. The extremity was prepped and draped in the usual sterile fashion and surgical timeout was performed.  The limb was elevated and the tourniquet was inflated to 250 mmHg.  We began by making a longitudinal incision overlying the previously made a posterior lateral incision.  It was taken through the area of the slow to heal wound as well as extended proximally and distally to gain access to the fibula and for posterior lateral approach to the tibia to remove the posterior plate.  The incision was carried deep and it was noted that there was tendinous material within the area of difficult to heal wound.  As the incision was carried deeper it was found that there was torn peroneus longus tendon within the wound.  The peroneus longus tendon was completely transected with some small remaining fibers adherent to the posterior aspect of the fibula.  We then carried the incision posterior to the peroneal tendons and the interval was found to gain access to the tibia.  The soft tissue was elevated off the plate and the posterior aspect of the tibia and the tibial plate was removed using a screwdriver and a key elevator to remove the plate.  It was done uneventfully.  It was placed on the  back table and discarded.  We then proceeded to inspect the fibula and the peroneal tendons.  There was  some soft bone and an osteotome was used to saucerized the fibula.  Upon opening of the lateral cortex there was no obvious purulent material.  Curette was used to remove any soft bone and this was sent for culture.  We then took a rondure and a curette and also removed some of the nonviable appearing tissue that was deep and this was sent for culture as well.  Then using a Pulsavac the skin, subcutaneous tissue, bone and tendinous material was irrigated and debridement with dissection scissors, 15 blade and rondure was used to remove nonviable tissue.  The area of debridement was approximately 4 cm x 3 cm x 2 cm and postdebridement it was approximately 4 cm x 3 cm x 2 cm.  There was no frank abscess.  After the wound was adequately debrided we proceeded to perform deep tendon transfer of the peroneus longus to the peroneus brevis given the frayed tendon and unrepairable tendon material.  Using a 2-0 Vicryl suture the peroneus longus tendon was transferred to the peroneus brevis tendon just proximal to the retrofibular groove.  This was done in a running mattress suture technique as well as a portion of a locking Krakw fashion.  We then turned our attention to the medial ankle.  She had a medial malleolus screw that was in place.  An incision was made overlying the previously made and medial malleolus incision.  The incision was carried sharply down to bone.  Then hemostat was used to bluntly dissect to gain access to the medial malleolus screw.  Then a K wire was placed within the cannulated screw and a cannulated screwdriver was used to remove the screw uneventfully.  Then all of the wounds were reirrigated.  We then proceeded to close the lateral incision.  This was done in a layered fashion using 3-0 Monocryl and 2-0 nylon suture.  We are able to close most of the wound and there was no obvious open wound remaining after it was closed.  There is a small area distally where the wound had been difficult  to heal with the tendinous material that was slightly gapped but there was no exposed fibula bone.  The medial wound was also closed in a similar fashion.  Tourniquet was released.  We then proceeded to place a Prevena incisional wound VAC along the lateral wound.  There was good suction after placement of the wound VAC.  Then soft dressing and a posterior slab splint was placed about the ankle.  She was then awakened from anesthesia and taken recovery in stable condition.  No complications.  Counts were correct.  POST OPERATIVE INSTRUCTIONS: Nonweightbearing to operative extremity Monitor wound VAC output Wound VAC to remain the duration of canister Continue antibiotics and follow-up on cultures  TOURNIQUET TIME: Less than 2 hours  BLOOD LOSS:  Minimal         DRAINS: none         SPECIMEN: none       COMPLICATIONS:  * No complications entered in OR log *         Disposition: PACU - hemodynamically stable.         Condition: stable

## 2022-08-20 NOTE — Anesthesia Preprocedure Evaluation (Signed)
Anesthesia Evaluation  Patient identified by MRN, date of birth, ID band Patient awake    Reviewed: Allergy & Precautions, H&P , NPO status , Patient's Chart, lab work & pertinent test results  Airway Mallampati: II   Neck ROM: full    Dental   Pulmonary neg pulmonary ROS   breath sounds clear to auscultation       Cardiovascular hypertension,  Rhythm:regular Rate:Normal     Neuro/Psych  Headaches PSYCHIATRIC DISORDERS Anxiety Depression     Neuromuscular disease    GI/Hepatic   Endo/Other    Renal/GU      Musculoskeletal  (+) Arthritis ,    Abdominal   Peds  Hematology   Anesthesia Other Findings   Reproductive/Obstetrics                             Anesthesia Physical Anesthesia Plan  ASA: 3  Anesthesia Plan: General   Post-op Pain Management:    Induction: Intravenous  PONV Risk Score and Plan: 3 and Ondansetron, Dexamethasone and Treatment may vary due to age or medical condition  Airway Management Planned: LMA  Additional Equipment:   Intra-op Plan:   Post-operative Plan: Extubation in OR  Informed Consent: I have reviewed the patients History and Physical, chart, labs and discussed the procedure including the risks, benefits and alternatives for the proposed anesthesia with the patient or authorized representative who has indicated his/her understanding and acceptance.     Dental advisory given  Plan Discussed with: CRNA, Anesthesiologist and Surgeon  Anesthesia Plan Comments:        Anesthesia Quick Evaluation

## 2022-08-21 ENCOUNTER — Other Ambulatory Visit: Payer: Self-pay

## 2022-08-21 ENCOUNTER — Encounter (HOSPITAL_COMMUNITY): Payer: Self-pay | Admitting: Orthopaedic Surgery

## 2022-08-21 DIAGNOSIS — I10 Essential (primary) hypertension: Secondary | ICD-10-CM | POA: Diagnosis not present

## 2022-08-21 DIAGNOSIS — F900 Attention-deficit hyperactivity disorder, predominantly inattentive type: Secondary | ICD-10-CM | POA: Diagnosis not present

## 2022-08-21 DIAGNOSIS — S31000D Unspecified open wound of lower back and pelvis without penetration into retroperitoneum, subsequent encounter: Secondary | ICD-10-CM | POA: Diagnosis not present

## 2022-08-21 DIAGNOSIS — M86171 Other acute osteomyelitis, right ankle and foot: Secondary | ICD-10-CM | POA: Diagnosis not present

## 2022-08-21 LAB — COMPREHENSIVE METABOLIC PANEL: BUN: 13 mg/dL (ref 8–23)

## 2022-08-21 MED ORDER — CEFAZOLIN IV (FOR PTA / DISCHARGE USE ONLY): 2.0000 g | Freq: Three times a day (TID) | INTRAVENOUS | 0 refills | Status: AC

## 2022-08-21 MED ORDER — SODIUM CHLORIDE 0.9% FLUSH: 10.0000 mL | INTRAVENOUS | Status: DC | PRN

## 2022-08-21 MED ORDER — CHLORHEXIDINE GLUCONATE CLOTH 2 % EX PADS
6.0000 | MEDICATED_PAD | Freq: Every day | CUTANEOUS | Status: DC
  Administered 2022-08-21 – 2022-08-22 (×2): 6 via TOPICAL

## 2022-08-21 NOTE — Progress Notes (Signed)
PHARMACY CONSULT NOTE FOR:  OUTPATIENT  PARENTERAL ANTIBIOTIC THERAPY (OPAT)  Indication: Osteomyelitis of right ankle Regimen: Cefazolin 2g IV Q8H End date: 10/01/2022  IV antibiotic discharge orders are pended. To discharging provider:  please sign these orders via discharge navigator,  Select New Orders & click on the button choice - Manage This Unsigned Work.    Thank you for allowing pharmacy to be a part of this patient's care.  Lendon Ka, PharmD Candidate 08/21/2022, 11:27 AM
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
## 2022-08-21 NOTE — Anesthesia Postprocedure Evaluation (Signed)
Anesthesia Post Note  Patient: [name]  Procedure(s) Performed: IRRIGATION AND DEBRIDEMENT EXTREMITY WITH WOUND VAC PLACEMENT (Right: Ankle) HARDWARE REMOVAL (Right: Ankle)     Patient location during evaluation: PACU Anesthesia Type: General Level of consciousness: awake and alert Pain management: pain level controlled Vital Signs Assessment: post-procedure vital signs reviewed and stable Respiratory status: spontaneous breathing, nonlabored ventilation, respiratory function stable and patient connected to nasal cannula oxygen Cardiovascular status: blood pressure returned to baseline and stable Postop Assessment: no apparent nausea or vomiting Anesthetic complications: no   No notable events documented.  Last Vitals:  Vitals:   08/21/22 0421 08/21/22 0735  BP: 126/62 129/82  Pulse: 64 71  Resp: 16   Temp: 36.6 C 36.4 C  SpO2: 96% 98%    Last Pain:  Vitals:   08/21/22 0857  TempSrc:   PainSc: 7                  Joy Haegele S

## 2022-08-21 NOTE — Evaluation (Signed)
Occupational Therapy Evaluation Patient Details Name: [name] MRN: [id number] DOB: 1955/08/04 Today's Date: 08/21/2022   History of Present Illness t is a 67 y.o. female presenting for R ankle pain with concern for infection spread. She is s/p R ankle irrigation and debridement 08/20/22. PMH significant for ORIF R fibula, L total shoulder arthroplasty, ACDF, ORIF L clavicle, ADHD, ARDS, osteoporosis, R prosthetic eye, HTN, ADHD, MRSA, chronic narcotic dependence.   Clinical Impression   Pt admitted for above dx, PTA she was Mod I with RW and ind in ADLs. Pt presenting with impaired balance and TDWB RLE status. Pt ambulating and completing transfers with min guard to supervision assist but displays decreased safety with overt gross movements, able to complete seated bADLs with supervision. Pt close to functional baseline, has been ambulating well NWB for a while. Pt would benefit from continue acute skilled OT services to help transition to next level of care and promote better safety habits. No follow-up OT recommend post acutely.       Recommendations for follow up therapy are one component of a multi-disciplinary discharge planning process, led by the attending physician.  Recommendations may be updated based on patient status, additional functional criteria and insurance authorization.   Assistance Recommended at Discharge Set up Supervision/Assistance  Patient can return home with the following Assistance with cooking/housework;Assist for transportation    Functional Status Assessment  Patient has had a recent decline in their functional status and demonstrates the ability to make significant improvements in function in a reasonable and predictable amount of time.  Equipment Recommendations  None recommended by OT (Pt has rec DME)    Recommendations for Other Services       Precautions / Restrictions Precautions Precautions: Fall Restrictions Weight Bearing  Restrictions: Yes RLE Weight Bearing: Touchdown weight bearing      Mobility Bed Mobility Overal bed mobility: Modified Independent             General bed mobility comments: Pt rec'd sitting in recliner, Mod I for sit>supine    Transfers Overall transfer level: Needs assistance Equipment used: Rolling walker (2 wheels) Transfers: Sit to/from Stand Sit to Stand: Supervision              Balance Overall balance assessment: Needs assistance Sitting-balance support: No upper extremity supported, Feet supported Sitting balance-Leahy Scale: Good     Standing balance support: Bilateral upper extremity supported, During functional activity, Reliant on assistive device for balance Standing balance-Leahy Scale: Fair           ADL either performed or assessed with clinical judgement   ADL Overall ADL's : Needs assistance/impaired Eating/Feeding: Independent;Sitting   Grooming: Standing;Min guard   Upper Body Bathing: Independent;Sitting   Lower Body Bathing: Sitting/lateral leans;Supervison/ safety   Upper Body Dressing : Sitting;Independent   Lower Body Dressing: Supervision/safety;Sitting/lateral leans   Toilet Transfer: Ambulation;Supervision/safety   Toileting- Clothing Manipulation and Hygiene: Sitting/lateral lean;Supervision/safety       Functional mobility during ADLs: Rolling walker (2 wheels);Min guard General ADL Comments: Pt ambulating in hall with min guard to close supervision ~112ft      Pertinent Vitals/Pain Pain Assessment Pain Assessment: 0-10 Pain Score: 7  Pain Location: R ankle Pain Descriptors / Indicators: Grimacing, Discomfort Pain Intervention(s): Limited activity within patient's tolerance, Monitored during session, Repositioned     Hand Dominance Right   Extremity/Trunk Assessment Upper Extremity Assessment Upper Extremity Assessment: Overall WFL for tasks assessed   Lower Extremity Assessment Lower Extremity  Assessment: RLE  deficits/detail RLE Deficits / Details: NWB on RLE. S/p I&D   Cervical / Trunk Assessment Cervical / Trunk Assessment: Normal   Communication Communication Communication: No difficulties   Cognition Arousal/Alertness: Awake/alert Behavior During Therapy: WFL for tasks assessed/performed Overall Cognitive Status: Impaired/Different from baseline Area of Impairment: Safety/judgement                 Safety/Judgement: Decreased awareness of safety     General Comments: Pt definetely makes unsafe reaches outside of BOS, may be her baseline. Pt educated to avoid overt reaching to reduce risk of falls     General Comments  VSS            Home Living Family/patient expects to be discharged to:: Private residence Living Arrangements: Alone Available Help at Discharge: Friend(s) Type of Home: House Home Access: Ramped entrance     Home Layout: One level     Bathroom Shower/Tub: Tub/shower unit;Walk-in shower   Bathroom Toilet: Standard     Home Equipment: Shower seat;Cane - single Librarian, academic (2 wheels);Rollator (4 wheels);Transport chair;BSC/3in1;Other (comment)          Prior Functioning/Environment Prior Level of Function : Needs assist         Mobility Comments: Mod I RW ADLs Comments: Ind        OT Problem List: Impaired balance (sitting and/or standing)      OT Treatment/Interventions: Self-care/ADL training;Therapeutic activities;Patient/family education;Balance training    OT Goals(Current goals can be found in the care plan section) Acute Rehab OT Goals Patient Stated Goal: To go home OT Goal Formulation: With patient Time For Goal Achievement: 09/04/22 Potential to Achieve Goals: Good ADL Goals Pt Will Perform Grooming: with modified independence;standing Pt Will Perform Lower Body Bathing: with modified independence;sitting/lateral leans Pt Will Perform Lower Body Dressing: with modified independence;sit to/from  stand Pt Will Transfer to Toilet: with modified independence;ambulating  OT Frequency: Min 1X/week       AM-PAC OT "6 Clicks" Daily Activity     Outcome Measure Help from another person eating meals?: None Help from another person taking care of personal grooming?: A Little Help from another person toileting, which includes using toliet, bedpan, or urinal?: A Little Help from another person bathing (including washing, rinsing, drying)?: A Little Help from another person to put on and taking off regular upper body clothing?: A Little Help from another person to put on and taking off regular lower body clothing?: A Little 6 Click Score: 19   End of Session Equipment Utilized During Treatment: Gait belt;Rolling walker (2 wheels) Nurse Communication: Mobility status  Activity Tolerance: Patient tolerated treatment well Patient left: in bed;with call bell/phone within reach;Other (comment) (PICC team in room, notified them to reset bed alarm)  OT Visit Diagnosis: Unsteadiness on feet (R26.81);Other abnormalities of gait and mobility (R26.89)                Time: 1610-9604 OT Time Calculation (min): 29 min Charges:  OT General Charges $OT Visit: 1 Visit OT Evaluation $OT Eval Moderate Complexity: 1 Mod OT Treatments $Therapeutic Activity: 8-22 mins  08/21/2022  AB, OTR/L  Acute Rehabilitation Services  Office: [phone]   Tristan Schroeder 08/21/2022, 4:37 PM
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
## 2022-08-21 NOTE — Progress Notes (Signed)
     [name] is a 67 y.o. female   Orthopaedic diagnosis: Status post deep orthopedic hardware removal from right ankle with fibular saucerization and I&D with wound closure.  Peroneal tendon transfer  Subjective: Patient is resting comfortably.  Some discomfort but overall well-controlled.  VAC has not been putting much out.  Objectyive: Vitals:   08/21/22 0001 08/21/22 0421  BP: 119/77 126/62  Pulse: 77 64  Resp: 16 16  Temp: 97.8 F (36.6 C) 97.8 F (36.6 C)  SpO2: 98% 96%     Exam: Awake and alert Respirations even and unlabored No acute distress  Right leg with dressing in place.  Wound VAC with good suction.  She is able to wiggle toes.  Sensation grossly intact.  Assessment: Postop day 1 status post above.   Plan: She is doing well.  The VAC can remain in 7 days we will plan to do that.  Will await culture results and antibiotic therapy recommendations per infectious disease.  She is touchdown weightbearing on the right lower extremity.  She would be suitable for discharge from an orthopedic standpoint once deemed suitable by hospitalist and infectious disease.  We will see her back in the clinic within the week to remove the [facility] and check the wound.   Nicki Guadalajara, MD

## 2022-08-21 NOTE — Progress Notes (Signed)
Peripherally Inserted Central Catheter Placement  The IV Nurse has discussed with the patient and/or persons authorized to consent for the patient, the purpose of this procedure and the potential benefits and risks involved with this procedure.  The benefits include less needle sticks, lab draws from the catheter, and the patient may be discharged home with the catheter. Risks include, but not limited to, infection, bleeding, blood clot (thrombus formation), and puncture of an artery; nerve damage and irregular heartbeat and possibility to perform a PICC exchange if needed/ordered by physician.  Alternatives to this procedure were also discussed.  Bard Power PICC patient education guide, fact sheet on infection prevention and patient information card has been provided to patient /or left at bedside.  PICC inserted by Conrad Argyle, RN  PICC Placement Documentation  PICC Single Lumen 08/21/22 Right Basilic 35 cm 0 cm (Active)  Indication for Insertion or Continuance of Line Home intravenous therapies (PICC only) 08/21/22 1538  Exposed Catheter (cm) 0 cm 08/21/22 1538  Site Assessment Clean, Dry, Intact 08/21/22 1538  Line Status Flushed;Saline locked;Blood return noted 08/21/22 1538  Dressing Type Transparent;Securing device 08/21/22 1538  Dressing Status Antimicrobial disc in place;Clean, Dry, Intact 08/21/22 1538  Safety Lock Not Applicable 08/21/22 1538  Line Care Connections checked and tightened 08/21/22 1538  Dressing Intervention New dressing 08/21/22 1538  Dressing Change Due 08/28/22 08/21/22 1538       Ruthann Angulo, Lajean Manes 08/21/2022, 3:39 PM

## 2022-08-21 NOTE — Progress Notes (Signed)
1 Day Post-Op  Subjective: Patient is a 67 year old female with history of a wound to the back and wound to her right ankle.  She underwent surgery with orthopedics to the right ankle yesterday.  Upon entering the room, patient is sitting in her chair no acute distress.  She reports she is doing well, she does report she has some pain from surgery yesterday.  She denies any issues with the back wound VAC.  Denies any new problems issues or concerns.  Objective: Vital signs in last 24 hours: Temp:  [97.6 F (36.4 C)-98.1 F (36.7 C)] 97.6 F (36.4 C) (07/26 0735) Pulse Rate:  [64-88] 71 (07/26 0735) Resp:  [12-19] 16 (07/26 0421) BP: (115-178)/(62-85) 129/82 (07/26 0735) SpO2:  [96 %-100 %] 98 % (07/26 0735) Last BM Date : 08/21/22  Intake/Output from previous day: 07/25 0701 - 07/26 0700 In: 840 [P.O.:240; I.V.:300; IV Piggyback:300] Out: 648 [Urine:501; Drains:125; Stool:2; Blood:20] Intake/Output this shift: No intake/output data recorded.  General appearance: alert, cooperative, and no distress Back: Wound VAC dressing was removed to the back wound.  There is some minimal serous drainage in the canister.  Wound was approximately 8 cm x 7 cm.  It does track inferiorly.  There is good granulation tissue noted to the superior aspect of the wound.  There is a little bit also noted to the inferior part of the wound.  Tissue throughout the wound appears to be healthy.  There is some surrounding irritation to the skin.  There are no signs of infection on exam. Resp: Unlabored breathing, no respiratory distress  Lab Results:     Latest Ref Rng & Units 08/21/2022    2:42 AM 08/20/2022    1:42 AM 08/19/2022    1:26 AM  CBC  WBC 4.0 - 10.5 K/uL 6.4  4.0  4.3   Hemoglobin 12.0 - 15.0 g/dL 9.8  9.6  9.7   Hematocrit 36.0 - 46.0 % 29.7  30.5  30.6   Platelets 150 - 400 K/uL 212  203  191     BMET Recent Labs    08/20/22 0142 08/21/22 0242  NA 135 136  K 3.4* 4.3  CL 100 102  CO2 25  23  GLUCOSE 100* 157*  BUN 7* 13  CREATININE 0.63 0.66  CALCIUM 8.6* 8.9   PT/INR No results for input(s): "LABPROT", "INR" in the last 72 hours. ABG No results for input(s): "PHART", "HCO3" in the last 72 hours.  Invalid input(s): "PCO2", "PO2"  Studies/Results: DG MINI C-ARM IMAGE ONLY  Result Date: 08/20/2022 There is no interpretation for this exam.  This order is for images obtained during a surgical procedure.  Please See "Surgeries" Tab for more information regarding the procedure.    Anti-infectives: Anti-infectives (From admission, onward)    Start     Dose/Rate Route Frequency Ordered Stop   08/19/22 1400  ceFAZolin (ANCEF) IVPB 2g/100 mL premix        2 g 200 mL/hr over 30 Minutes Intravenous Every 8 hours 08/19/22 1143     08/18/22 1700  vancomycin (VANCOREADY) IVPB 1250 mg/250 mL  Status:  Discontinued        1,250 mg 166.7 mL/hr over 90 Minutes Intravenous Every 24 hours 08/18/22 1037 08/19/22 1143   08/15/22 2100  vancomycin (VANCOREADY) IVPB 1250 mg/250 mL  Status:  Discontinued        1,250 mg 166.7 mL/hr over 90 Minutes Intravenous Every 24 hours 08/14/22 2238 08/14/22 2244   08/15/22  2100  vancomycin (VANCOCIN) IVPB 1000 mg/200 mL premix  Status:  Discontinued        1,000 mg 200 mL/hr over 60 Minutes Intravenous Every 24 hours 08/14/22 2244 08/18/22 1037   08/15/22 1000  valACYclovir (VALTREX) tablet 1,000 mg        1,000 mg Oral Daily 08/15/22 0035     08/15/22 0200  piperacillin-tazobactam (ZOSYN) IVPB 3.375 g  Status:  Discontinued        3.375 g 12.5 mL/hr over 240 Minutes Intravenous Every 8 hours 08/14/22 2238 08/19/22 1143   08/14/22 1930  vancomycin (VANCOCIN) IVPB 1000 mg/200 mL premix        1,000 mg 200 mL/hr over 60 Minutes Intravenous  Once 08/14/22 1926 08/14/22 2254   08/14/22 1930  piperacillin-tazobactam (ZOSYN) IVPB 3.375 g        3.375 g 100 mL/hr over 30 Minutes Intravenous  Once 08/14/22 1926 08/14/22 2108        Assessment/Plan: s/p Procedure(s): IRRIGATION AND DEBRIDEMENT EXTREMITY WITH WOUND VAC PLACEMENT HARDWARE REMOVAL  Wound VAC changed at bedside.  White sponge placed in inferior pocket and black foam placed on the top.  Good seal was achieved.   Discussed with patient that her wound appears to be coming along, but she still has a fairly deep pocket along the inferior aspect of the wound that we will need to fill in.  We will continue with the 2 times per week wound VAC changes with a white sponge and that the pocket and black sponge on top.  Patient expressed understanding.  Discussed with patient that we will plan to change the wound VAC early next week.  Discussed with her if she is discharged, she may come to the clinic early next week if she would like for change or we will change it in the hospital.  Patient expressed understanding.    LOS: 6 days    Laurena Spies, PA-C 08/21/2022

## 2022-08-21 NOTE — Progress Notes (Signed)
[facility]  Date of Admission:  08/14/2022     Total days of antibiotics 8         ASSESSMENT:  [name] is POD #1 from right ankle hardware removal with surgical specimens growing gram positive cocci in pairs and culture without growth in <24 hours and previous cultures with MSSA. Encouraged protein intake to help with wound healing. Continue current dose of cefazolin and monitor surgical cultures. Anticipate prolonged course of antibiotics and will place PICC line. Post-operative wound care per Orthopedics with remaining medical and supportive care per Internal Medicine.   PLAN:  Continue current dose of Cefazolin. Post-operative wound care per Orthopedics. Monitor cultures for organisms PICC line placement  Remaining medical and supportive care per Internal Medicine.   Dr. Renold Don is available over the weekend for ID related questions.    Principal Problem:   Osteomyelitis of right ankle (HCC) Active Problems:   ADHD (attention deficit hyperactivity disorder), inattentive type   Open wound of lower back and pelvis w/o penentrat into retroperitoneum, subsequent encounter   HTN (hypertension)   Cellulitis of right ankle    amphetamine-dextroamphetamine  20 mg Oral [id number]   celecoxib  200 mg Oral BID   vitamin B-12  1,000 mcg Oral Daily   cycloSPORINE  2 drop Both Eyes BID   enoxaparin (LOVENOX) injection  40 mg Subcutaneous Daily   oxyCODONE  20 mg Oral TID   polyethylene glycol  17 g Oral Daily   pregabalin  300 mg Oral BID   senna-docusate  2 tablet Oral BID   valACYclovir  1,000 mg Oral Daily    SUBJECTIVE:  Afebrile overnight with no acute events. Pain adequately controlled.  No Known Allergies   Review of Systems: Review of Systems  Constitutional:  Negative for chills, fever and weight loss.  Respiratory:  Negative for cough, shortness of breath and wheezing.   Cardiovascular:  Negative for chest pain and leg swelling.   Gastrointestinal:  Negative for abdominal pain, constipation, diarrhea, nausea and vomiting.  Skin:  Negative for rash.    OBJECTIVE: Vitals:   08/20/22 1956 08/21/22 0001 08/21/22 0421 08/21/22 0735  BP: 115/65 119/77 126/62 129/82  Pulse: 74 77 64 71  Resp: 16 16 16    Temp: 98.1 F (36.7 C) 97.8 F (36.6 C) 97.8 F (36.6 C) 97.6 F (36.4 C)  TempSrc: Oral Oral Oral Oral  SpO2: 96% 98% 96% 98%  Weight:      Height:       Body mass index is 20.2 kg/m.  Physical Exam Constitutional:      General: She is not in acute distress.    Appearance: She is well-developed.  Cardiovascular:     Rate and Rhythm: Normal rate and regular rhythm.     Heart sounds: Normal heart sounds.  Pulmonary:     Effort: Pulmonary effort is normal.     Breath sounds: Normal breath sounds.  Musculoskeletal:     Comments: Wound vac in place.   Skin:    General: Skin is warm and dry.  Neurological:     Mental Status: She is alert.  Psychiatric:        Mood and Affect: Mood normal.     Lab Results Lab Results  Component Value Date   WBC 6.4 08/21/2022   HGB 9.8 (L) 08/21/2022   HCT 29.7 (L) 08/21/2022   MCV 92.2 08/21/2022   PLT 212 08/21/2022    Lab Results  Component Value Date   CREATININE 0.66 08/21/2022   BUN 13 08/21/2022   NA 136 08/21/2022   K 4.3 08/21/2022   CL 102 08/21/2022   CO2 23 08/21/2022    Lab Results  Component Value Date   ALT 11 08/21/2022   AST 22 08/21/2022   ALKPHOS 67 08/21/2022   BILITOT 0.6 08/21/2022     Microbiology: Recent Results (from the past 240 hour(s))  Blood Cultures x 2 sites     Status: None   Collection Time: 08/14/22  3:03 PM   Specimen: BLOOD  Result Value Ref Range Status   Specimen Description BLOOD LEFT ANTECUBITAL  Final   Special Requests   Final    BOTTLES DRAWN AEROBIC AND ANAEROBIC Blood Culture results may not be optimal due to an excessive volume of blood received in culture bottles   Culture   Final    NO GROWTH 5  DAYS Performed at [facility], [address] [place], [place] [address]    Report Status 08/19/2022 FINAL  Final  Blood Cultures x 2 sites     Status: None   Collection Time: 08/14/22  8:35 PM   Specimen: BLOOD  Result Value Ref Range Status   Specimen Description BLOOD RIGHT ANTECUBITAL  Final   Special Requests   Final    BOTTLES DRAWN AEROBIC AND ANAEROBIC Blood Culture adequate volume   Culture   Final    NO GROWTH 5 DAYS Performed at [facility], [address], [place], [place] [address]    Report Status 08/19/2022 FINAL  Final  Aerobic Culture w Gram Stain (superficial specimen)     Status: None   Collection Time: 08/14/22 10:12 PM   Specimen: Ankle  Result Value Ref Range Status   Specimen Description ANKLE  Final   Special Requests NONE  Final   Gram Stain   Final    MODERATE WBC PRESENT, PREDOMINANTLY MONONUCLEAR FEW GRAM POSITIVE COCCI IN PAIRS FEW GRAM POSITIVE COCCI IN CLUSTERS Performed at [facility], [address] [place], [place] [address]    Culture RARE STAPHYLOCOCCUS AUREUS  Final   Report Status 08/19/2022 FINAL  Final   Organism ID, Bacteria STAPHYLOCOCCUS AUREUS  Final      Susceptibility   Staphylococcus aureus - MIC*    CIPROFLOXACIN <=0.5 SENSITIVE Sensitive     ERYTHROMYCIN <=0.25 SENSITIVE Sensitive     GENTAMICIN <=0.5 SENSITIVE Sensitive     OXACILLIN <=0.25 SENSITIVE Sensitive     TETRACYCLINE <=1 SENSITIVE Sensitive     VANCOMYCIN 1 SENSITIVE Sensitive     TRIMETH/SULFA <=10 SENSITIVE Sensitive     CLINDAMYCIN <=0.25 SENSITIVE Sensitive     RIFAMPIN <=0.5 SENSITIVE Sensitive     Inducible Clindamycin NEGATIVE Sensitive     LINEZOLID 2 SENSITIVE Sensitive     * RARE STAPHYLOCOCCUS AUREUS  Surgical pcr screen     Status: None   Collection Time: 08/17/22  9:47 PM   Specimen: Nasal Mucosa; Nasal Swab  Result Value Ref Range Status   MRSA, PCR NEGATIVE NEGATIVE Final   Staphylococcus aureus NEGATIVE NEGATIVE Final     Comment: (NOTE) The Xpert SA Assay (FDA approved for NASAL specimens in patients 62 years of age and older), is one component of a comprehensive surveillance program. It is not intended to diagnose infection nor to guide or monitor treatment. Performed at [facility], [address] [place], [place] [address]   Aerobic/Anaerobic Culture w  Gram Stain (surgical/deep wound)     Status: None (Preliminary result)   Collection Time: 08/20/22 11:17 AM   Specimen: Path Tissue  Result Value Ref Range Status   Specimen Description TISSUE  Final   Special Requests RT LATERAL ANKLE PT ON ANCEF  Final   Gram Stain NO WBC SEEN FEW GRAM POSITIVE COCCI IN PAIRS   Final   Culture   Final    NO GROWTH < 24 HOURS Performed at [facility], [address], [place], [place] [address]    Report Status PENDING  Incomplete     Marcos Eke, NP [facility]  08/21/2022  10:09 AM

## 2022-08-22 ENCOUNTER — Other Ambulatory Visit (HOSPITAL_COMMUNITY): Payer: Self-pay

## 2022-08-22 DIAGNOSIS — S31000D Unspecified open wound of lower back and pelvis without penetration into retroperitoneum, subsequent encounter: Secondary | ICD-10-CM | POA: Diagnosis not present

## 2022-08-22 DIAGNOSIS — M86171 Other acute osteomyelitis, right ankle and foot: Secondary | ICD-10-CM | POA: Diagnosis not present

## 2022-08-22 DIAGNOSIS — I10 Essential (primary) hypertension: Secondary | ICD-10-CM | POA: Diagnosis not present

## 2022-08-22 DIAGNOSIS — F900 Attention-deficit hyperactivity disorder, predominantly inattentive type: Secondary | ICD-10-CM | POA: Diagnosis not present

## 2022-08-22 MED ORDER — HEPARIN SOD (PORK) LOCK FLUSH 100 UNIT/ML IV SOLN
250.0000 [IU] | INTRAVENOUS | Status: AC | PRN
  Administered 2022-08-22: 250 [IU]

## 2022-08-22 MED ORDER — CYANOCOBALAMIN 1000 MCG PO TABS
1000.0000 ug | ORAL_TABLET | Freq: Every day | ORAL | 0 refills | Status: DC
  Filled 2022-08-22: qty 30, 30d supply, fill #0

## 2022-08-22 MED ORDER — SENNOSIDES-DOCUSATE SODIUM 8.6-50 MG PO TABS
1.0000 | ORAL_TABLET | Freq: Two times a day (BID) | ORAL | 0 refills | Status: AC
  Filled 2022-08-22: qty 60, 30d supply, fill #0

## 2022-08-22 MED ORDER — ONDANSETRON HCL 4 MG PO TABS
4.0000 mg | ORAL_TABLET | Freq: Four times a day (QID) | ORAL | 0 refills | Status: DC | PRN
  Filled 2022-08-22: qty 20, 5d supply, fill #0

## 2022-08-22 NOTE — Progress Notes (Signed)
Subjective: 2 Days Post-Op Procedure(s) (LRB): IRRIGATION AND DEBRIDEMENT EXTREMITY WITH WOUND VAC PLACEMENT (Right) HARDWARE REMOVAL (Right) Patient reports pain as moderate.    Objective: Vital signs in last 24 hours: Temp:  [97.8 F (36.6 C)-98.1 F (36.7 C)] 97.8 F (36.6 C) (07/27 0729) Pulse Rate:  [66-77] 66 (07/27 0729) BP: (99-136)/(55-73) 115/73 (07/27 0729) SpO2:  [94 %-98 %] 96 % (07/27 0729)  Intake/Output from previous day: No intake/output data recorded. Intake/Output this shift: No intake/output data recorded.  Recent Labs    08/20/22 0142 08/21/22 0242 08/22/22 0304  HGB 9.6* 9.8* 9.2*   Recent Labs    08/21/22 0242 08/22/22 0304  WBC 6.4 4.9  RBC 3.22* 3.19*  HCT 29.7* 29.4*  PLT 212 189   Recent Labs    08/21/22 0242 08/22/22 0304  NA 136 138  K 4.3 3.7  CL 102 105  CO2 23 24  BUN 13 10  CREATININE 0.66 0.62  GLUCOSE 157* 82  CALCIUM 8.9 8.3*   No results for input(s): "LABPT", "INR" in the last 72 hours.  Neurologically intact ABD soft Neurovascular intact Sensation intact distally Incision: dressing C/D/I   Assessment/Plan: 2 Days Post-Op Procedure(s) (LRB): IRRIGATION AND DEBRIDEMENT EXTREMITY WITH WOUND VAC PLACEMENT (Right) HARDWARE REMOVAL (Right) Advance diet Up with therapy Discharge home with home health  Plan: -Patient is doing well and is ready for discharge from an orthopaedic perspective.  Belva Chimes for 7 days.   -Awaiting culture results and antibiotic therapy recommendations per infectious disease. -TTWB RLE.   D/C once cleared by ID and hospitalist. Clinic follow up in 1 week  Shanon Payor 08/22/2022, 8:12 AM
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
## 2022-08-22 NOTE — Plan of Care (Signed)

## 2022-08-22 NOTE — TOC Transition Note (Addendum)
Transition of Care [facility]) - CM/SW Discharge Note   Patient Details  Name: [name] MRN: [id number] Date of Birth: 22-Apr-1955  Transition of Care [facility]) CM/SW Contact:  Ronny Bacon, RN Phone Number: 08/22/2022, 9:40 AM   Clinical Narrative:   Patient with possible discharge home today with [facility] PT/RN. [facility] confirms having patient on their list for PT, RN service needed and [facility] is able to accommodate. Request to attending for [facility] PT continuation orders and orders for [facility] RN for IV antibiotic follow up.  1610- Patient does not have a ride home, cab voucher given to floor nurse.    Final next level of care: Home w Home Health Services Barriers to Discharge: Continued Medical Work up   Patient Goals and CMS Choice   Choice offered to / list presented to : Patient  Discharge Placement                         Discharge Plan and Services Additional resources added to the After Visit Summary for                  DME Arranged: Other see comment ([facility]/ [facility] therapy)   Date DME Agency Contacted: 08/21/22 Time DME Agency Contacted: [phone] Representative spoke with at DME Agency: Brayton Mars Arranged: RN          Social Determinants of Health (SDOH) Interventions SDOH Screenings   Food Insecurity: Unknown (07/22/2022)  Housing: Low Risk  (08/14/2021)  Transportation Needs: Unknown (07/22/2022)  Alcohol Screen: Low Risk  (08/14/2021)  Depression (PHQ2-9): Low Risk  (05/01/2021)  Financial Resource Strain: Low Risk  (06/08/2022)   Received from [facility]  Physical Activity: Sufficiently Active (08/14/2021)  Social Connections: Unknown (03/25/2022)   Received from [facility]  Stress: No Stress Concern Present (08/14/2021)  Tobacco Use: Low Risk  (08/20/2022)     Readmission Risk Interventions     No data to display

## 2022-08-22 NOTE — Progress Notes (Signed)
Discharge discussed with patient, all questions answered. Patient given AVS and antibiotic RX.  Melony Overly, RN

## 2022-08-23 DIAGNOSIS — M869 Osteomyelitis, unspecified: Secondary | ICD-10-CM | POA: Diagnosis not present

## 2022-08-24 ENCOUNTER — Other Ambulatory Visit (HOSPITAL_COMMUNITY): Payer: Self-pay

## 2022-08-24 ENCOUNTER — Telehealth: Payer: Self-pay

## 2022-08-24 NOTE — Transitions of Care (Post Inpatient/ED Visit) (Signed)
08/24/2022  Name: [name] MRN: [id number] DOB: 05-Aug-1955  Today's TOC FU Call Status: Today's TOC FU Call Status:: Successful TOC FU Call Competed TOC FU Call Complete Date: 08/24/22  Transition Care Management Follow-up Telephone Call Date of Discharge: 08/22/22 Discharge Facility: [facility]) Type of Discharge: Inpatient Admission Primary Inpatient Discharge Diagnosis:: Osteomyelitis of right ankle How have you been since you were released from the hospital?: Better Any questions or concerns?: Yes Patient Questions/Concerns:: (S) has been in alot of pain and wants to make appt to be seen for refill on that Patient Questions/Concerns Addressed: (S) Notified Provider of Patient Questions/Concerns  Items Reviewed: Did you receive and understand the discharge instructions provided?: Yes Medications obtained,verified, and reconciled?: Yes (Medications Reviewed) Any new allergies since your discharge?: No Dietary orders reviewed?: Yes Do you have support at home?: Yes  Medications Reviewed Today: Medications Reviewed Today     Reviewed by Merleen Nicely, LPN (Licensed Practical Nurse) on 08/24/22 at 1102  Med List Status: <None>   Medication Order Taking? Sig Documenting Provider Last Dose Status Informant  Acetaminophen Extra Strength 500 MG TABS [id number] Yes Take 1,000 mg by mouth as needed (pain). [provider] Taking Active Self  amphetamine-dextroamphetamine (ADDERALL XR) 20 MG 24 hr capsule [id number] Yes Take 1 capsule (20 mg total) by mouth every morning. Nelwyn Salisbury, MD Taking Active Self  ceFAZolin (ANCEF) IVPB [id number] Yes Inject 2 g into the vein every 8 (eight) hours. Indication: Osteomyelitis of right ankle First Dose: Yes Last Day of Therapy: 10/01/2022 Labs - Once weekly:  CBC/D and BMP, Labs - Once weekly: ESR and CRP Method of administration: IV Push Method of administration may be changed at the discretion of home infusion  pharmacist based upon assessment of the patient and/or caregiver's ability to self-administer the medication ordered. Gardiner Barefoot, MD Taking Active   celecoxib (CELEBREX) 200 MG capsule [id number] Yes Take 1 capsule (200 mg total) by mouth 2 (two) times daily. Nelwyn Salisbury, MD Taking Active Self           Med Note [facility], Elvin So   Fri Aug 14, 2022 11:25 PM)    cyanocobalamin 1000 MCG tablet [id number] Yes Take 1 tablet (1,000 mcg total) by mouth daily. Sheikh, Omair [place], DO Taking Active   cycloSPORINE (RESTASIS) 0.05 % ophthalmic emulsion [id number] Yes Place 1 drop into both eyes 2 times daily.  Patient taking differently: Place 2 drops into both eyes 2 (two) times daily.    Taking Active Self           Med Note ([facility], Elvin So   Fri Aug 14, 2022 11:31 PM)    diazepam (VALIUM) 5 MG tablet [id number] Yes Take 1 tablet (5 mg total) by mouth every 8 (eight) hours as needed for anxiety. Nelwyn Salisbury, MD Taking Active Self           Med Note [facility], Elvin So   Fri Aug 14, 2022 11:44 PM) Kept on hand when needed  estradiol (ESTRACE VAGINAL) 0.1 MG/GM vaginal cream [id number] Yes Apply a fingertip amount to urethra 2 times per week.  Patient taking differently: Place 1 Applicatorful vaginally once a week.    Taking Active Self           Med Note Lenoria Farrier   Wed Apr 15, 2022 11:23 AM)    HYDROmorphone (DILAUDID) 8 MG tablet [id number] No Take 1 tablet (8 mg total) by mouth every 8 (eight)  hours as needed for severe pain.  Patient not taking: Reported on 08/24/2022   Nelwyn Salisbury, MD Not Taking Active Self  nystatin (MYCOSTATIN) 100000 UNIT/ML suspension [id number] Yes Take 5 mLs (500,000 Units total) by mouth 4 (four) times daily.  Patient taking differently: Take 5 mLs by mouth as needed (thrush).   Nelwyn Salisbury, MD Taking Active Self  ondansetron [facility]) 4 MG tablet [id number] Yes Take 1 tablet (4 mg total) by mouth every 6 (six) hours as needed for nausea. Marguerita Merles [place], [place]  Taking Active   Oxycodone HCl 20 MG TABS [id number] Yes Take 1 tablet (20 mg total) by mouth every 4 (four) hours as needed (pain).  Patient taking differently: Take 20 mg by mouth 2 (two) times daily as needed (pain).   Nelwyn Salisbury, MD Taking Active Self  polyethylene glycol powder [facility]) powder [id number] Yes TAKE 17 GRAMS BY MOUTH 2 TIMES DAILY AS NEEDED.  Patient taking differently: Take 17 g by mouth as needed for moderate constipation.   Nelwyn Salisbury, MD Taking Active Self  pregabalin (LYRICA) 300 MG capsule [id number] Yes Take 1 capsule (300 mg total) by mouth 2 (two) times daily. Nelwyn Salisbury, MD Taking Active Self  senna-docusate (SENOKOT-S) 8.6-50 MG tablet [id number] Yes Take 1 tablet by mouth 2 (two) times daily. Sheikh, Omair [place], DO Taking Active   valACYclovir (VALTREX) 1000 MG tablet [id number] Yes Take 1 tablet (1,000 mg total) by mouth 3 (three) times daily.  Patient taking differently: Take 1,000 mg by mouth daily.   Nelwyn Salisbury, MD Taking Active Self           Med Note [facility], Elvin So   Fri Aug 14, 2022 11:27 PM) Continuous therapy  verapamil (CALAN-SR) 120 MG CR tablet [id number] Yes Take 1 tablet (120 mg total) by mouth at bedtime. Nelwyn Salisbury, MD Taking Active Self           Med Note [facility], Elvin So   Fri Aug 14, 2022 11:27 PM)              Home Care and Equipment/Supplies: Were Home Health Services Ordered?: Yes Name of Home Health Agency:: [facility] Has Agency set up a time to come to your home?: No Any new equipment or medical supplies ordered?: Yes Were you able to get the equipment/medical supplies?: Yes Do you have any questions related to the use of the equipment/supplies?: No  Functional Questionnaire: Do you need assistance with bathing/showering or dressing?: No Do you need assistance with meal preparation?: No Do you need assistance with eating?: No Do you have difficulty maintaining continence: No Do you need assistance with  getting out of bed/getting out of a chair/moving?: No Do you have difficulty managing or taking your medications?: No  Follow up appointments reviewed: PCP Follow-up appointment confirmed?: Yes MD Provider Line [phone] Given: Yes Date of PCP follow-up appointment?: 08/26/22 Follow-up Provider: Dr Clent Ridges [facility] Follow-up appointment confirmed?: Yes Date of Specialist follow-up appointment?: 08/28/22 Follow-Up Specialty Provider:: Dr Chilton Si and Dr Luciana Axe 09-10-22 Do you need transportation to your follow-up appointment?: No Do you understand care options if your condition(s) worsen?: Yes-patient verbalized understanding    SIGNATURE  Woodfin Ganja LPN [facility] Nurse Health Advisor Direct Dial [phone]

## 2022-08-25 ENCOUNTER — Ambulatory Visit (INDEPENDENT_AMBULATORY_CARE_PROVIDER_SITE_OTHER): Payer: Commercial Managed Care - PPO | Admitting: Physician Assistant

## 2022-08-25 VITALS — BP 119/68 | HR 88

## 2022-08-25 DIAGNOSIS — Z9889 Other specified postprocedural states: Secondary | ICD-10-CM

## 2022-08-25 DIAGNOSIS — S31000D Unspecified open wound of lower back and pelvis without penetration into retroperitoneum, subsequent encounter: Secondary | ICD-10-CM

## 2022-08-25 NOTE — Progress Notes (Signed)
Patient is a pleasant 66 year old female with history of low back hematoma s/p evacuation with placement of myriad and wound VAC as well as excision of right ankle wound with placement of myriad performed 07/22/2022 by Dr. Ulice Bold who presents to clinic for postoperative follow-up.  She was recently charged from the hospital after admission for right ankle osteomyelitis.  That wound was closed primarily at time of hardware removal by her orthopedic surgeon.  Today, patient is doing well.  She states that Dr. Susa Simmonds was able to perform a tendon repair which helped to permit good wound closure over the lateral malleolus.  She is seeing him later this week and does not feel as though she requires any wound care assistance from our end.  She currently has an incisional VAC in place.  As for her low back wound, she has not had any issues since most recent VAC change last week.  She has HHRN assisting with VAC changes.  Denies any new issues.  She has a PICC line in place for ongoing IV antibiotics in setting of her recently diagnosed osteomyelitis.  Wound VAC is removed without complication or difficulty.  She continues to have excellent granulation over the upper 60% of the hematoma excision site.  However, there fails to be any significantly improved granulation of the lower portion of excision site.  Wound measures 7 x 7.5 cm with variable depth.  There is an additional 3 cm tracking beneath the skin edges on inferior aspect of wound.  No significant fluid collection noted and the surrounding skin and tissue appears healthy.  Replace wound VAC.  Follow-up in 2 weeks.  Continue with twice weekly wound VAC changes in interim.  She has 8 canisters at home so she suspects that she will be okay for a while with regard to her wound VAC supplies.  Picture(s) obtained of the patient and placed in the chart were with the patient's or guardian's permission.

## 2022-08-26 ENCOUNTER — Encounter: Payer: Self-pay | Admitting: Family Medicine

## 2022-08-26 ENCOUNTER — Ambulatory Visit (INDEPENDENT_AMBULATORY_CARE_PROVIDER_SITE_OTHER): Payer: Commercial Managed Care - PPO | Admitting: Family Medicine

## 2022-08-26 ENCOUNTER — Inpatient Hospital Stay: Payer: Commercial Managed Care - PPO | Admitting: Family Medicine

## 2022-08-26 VITALS — BP 132/70 | HR 76 | Temp 98.0°F | Ht 61.5 in | Wt 107.8 lb

## 2022-08-26 DIAGNOSIS — I10 Essential (primary) hypertension: Secondary | ICD-10-CM | POA: Diagnosis not present

## 2022-08-26 DIAGNOSIS — D6489 Other specified anemias: Secondary | ICD-10-CM | POA: Diagnosis not present

## 2022-08-26 DIAGNOSIS — M86171 Other acute osteomyelitis, right ankle and foot: Secondary | ICD-10-CM

## 2022-08-26 DIAGNOSIS — E876 Hypokalemia: Secondary | ICD-10-CM

## 2022-08-26 DIAGNOSIS — F1190 Opioid use, unspecified, uncomplicated: Secondary | ICD-10-CM | POA: Diagnosis not present

## 2022-08-26 MED ORDER — OXYCODONE HCL 20 MG PO TABS: 20.0000 mg | ORAL_TABLET | Freq: Four times a day (QID) | ORAL | 0 refills | Status: DC | PRN

## 2022-08-26 MED ORDER — HYDROMORPHONE HCL 8 MG PO TABS: 8.0000 mg | ORAL_TABLET | Freq: Three times a day (TID) | ORAL | 0 refills | Status: DC | PRN
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
## 2022-08-27 DIAGNOSIS — F909 Attention-deficit hyperactivity disorder, unspecified type: Secondary | ICD-10-CM | POA: Diagnosis not present

## 2022-08-27 DIAGNOSIS — M810 Age-related osteoporosis without current pathological fracture: Secondary | ICD-10-CM | POA: Diagnosis not present

## 2022-08-27 DIAGNOSIS — S82891D Other fracture of right lower leg, subsequent encounter for closed fracture with routine healing: Secondary | ICD-10-CM | POA: Diagnosis not present

## 2022-08-27 DIAGNOSIS — S7002XA Contusion of left hip, initial encounter: Secondary | ICD-10-CM | POA: Diagnosis not present

## 2022-08-27 DIAGNOSIS — S91001A Unspecified open wound, right ankle, initial encounter: Secondary | ICD-10-CM | POA: Diagnosis not present

## 2022-08-27 DIAGNOSIS — F1911 Other psychoactive substance abuse, in remission: Secondary | ICD-10-CM | POA: Diagnosis not present

## 2022-08-27 DIAGNOSIS — M1990 Unspecified osteoarthritis, unspecified site: Secondary | ICD-10-CM | POA: Diagnosis not present

## 2022-08-27 DIAGNOSIS — G8929 Other chronic pain: Secondary | ICD-10-CM | POA: Diagnosis not present

## 2022-08-27 DIAGNOSIS — I7300 Raynaud's syndrome without gangrene: Secondary | ICD-10-CM | POA: Diagnosis not present

## 2022-08-27 DIAGNOSIS — F32A Depression, unspecified: Secondary | ICD-10-CM | POA: Diagnosis not present

## 2022-08-27 LAB — CBC WITH DIFFERENTIAL/PLATELET
Basophils Absolute: 0 K/uL (ref 0.0–0.1)
Basophils Relative: 0.6 % (ref 0.0–3.0)
Eosinophils Absolute: 0.5 K/uL (ref 0.0–0.7)
Eosinophils Relative: 7.9 % — ABNORMAL HIGH (ref 0.0–5.0)
HCT: 31.9 % — ABNORMAL LOW (ref 36.0–46.0)
Hemoglobin: 10.3 g/dL — ABNORMAL LOW (ref 12.0–15.0)
Lymphocytes Relative: 20.3 % (ref 12.0–46.0)
Lymphs Abs: 1.3 K/uL (ref 0.7–4.0)
MCHC: 32.4 g/dL (ref 30.0–36.0)
MCV: 90.9 fl (ref 78.0–100.0)
Monocytes Absolute: 1.1 K/uL — ABNORMAL HIGH (ref 0.1–1.0)
Monocytes Relative: 16.6 % — ABNORMAL HIGH (ref 3.0–12.0)
Neutro Abs: 3.5 K/uL (ref 1.4–7.7)
Neutrophils Relative %: 54.6 % (ref 43.0–77.0)
Platelets: 293 K/uL (ref 150.0–400.0)
RBC: 3.51 Mil/uL — ABNORMAL LOW (ref 3.87–5.11)
RDW: 17.7 % — ABNORMAL HIGH (ref 11.5–15.5)
WBC: 6.5 K/uL (ref 4.0–10.5)

## 2022-08-27 LAB — BASIC METABOLIC PANEL WITH GFR
BUN: 8 mg/dL (ref 6–23)
CO2: 25 meq/L (ref 19–32)
Calcium: 9.4 mg/dL (ref 8.4–10.5)
Chloride: 96 meq/L (ref 96–112)
Creatinine, Ser: 0.5 mg/dL (ref 0.40–1.20)
GFR: 98.87 mL/min
Glucose, Bld: 81 mg/dL (ref 70–99)
Potassium: 3.9 meq/L (ref 3.5–5.1)
Sodium: 131 meq/L — ABNORMAL LOW (ref 135–145)

## 2022-08-28 ENCOUNTER — Encounter: Payer: Commercial Managed Care - PPO | Admitting: Physician Assistant

## 2022-08-28 DIAGNOSIS — M869 Osteomyelitis, unspecified: Secondary | ICD-10-CM | POA: Diagnosis not present

## 2022-09-01 ENCOUNTER — Telehealth: Payer: Self-pay | Admitting: Family Medicine

## 2022-09-01 NOTE — Telephone Encounter (Signed)
Miracle rn with [facility] is calling and pt has  picc line and wound vac .Pt was d/c from hospital also resumption of care Pt  discharged on  08-27-2022 and Miracle would like SN 1x1, 2x4 for picc line and wound vac and medication management. Please call with VO approval

## 2022-09-01 NOTE — Telephone Encounter (Signed)
This should go to her Infectious Disease provider

## 2022-09-02 ENCOUNTER — Ambulatory Visit: Payer: Commercial Managed Care - PPO | Admitting: Family Medicine

## 2022-09-02 ENCOUNTER — Telehealth: Payer: Self-pay | Admitting: *Deleted

## 2022-09-02 ENCOUNTER — Telehealth: Payer: Self-pay | Admitting: Plastic Surgery

## 2022-09-02 DIAGNOSIS — M810 Age-related osteoporosis without current pathological fracture: Secondary | ICD-10-CM | POA: Diagnosis not present

## 2022-09-02 DIAGNOSIS — G8929 Other chronic pain: Secondary | ICD-10-CM | POA: Diagnosis not present

## 2022-09-02 DIAGNOSIS — M180 Bilateral primary osteoarthritis of first carpometacarpal joints: Secondary | ICD-10-CM | POA: Diagnosis not present

## 2022-09-02 DIAGNOSIS — F32A Depression, unspecified: Secondary | ICD-10-CM | POA: Diagnosis not present

## 2022-09-02 DIAGNOSIS — M1990 Unspecified osteoarthritis, unspecified site: Secondary | ICD-10-CM | POA: Diagnosis not present

## 2022-09-02 DIAGNOSIS — F909 Attention-deficit hyperactivity disorder, unspecified type: Secondary | ICD-10-CM | POA: Diagnosis not present

## 2022-09-02 DIAGNOSIS — S82891D Other fracture of right lower leg, subsequent encounter for closed fracture with routine healing: Secondary | ICD-10-CM | POA: Diagnosis not present

## 2022-09-02 DIAGNOSIS — S91001A Unspecified open wound, right ankle, initial encounter: Secondary | ICD-10-CM | POA: Diagnosis not present

## 2022-09-02 DIAGNOSIS — F1911 Other psychoactive substance abuse, in remission: Secondary | ICD-10-CM | POA: Diagnosis not present

## 2022-09-02 DIAGNOSIS — I7300 Raynaud's syndrome without gangrene: Secondary | ICD-10-CM | POA: Diagnosis not present

## 2022-09-02 DIAGNOSIS — S7002XA Contusion of left hip, initial encounter: Secondary | ICD-10-CM | POA: Diagnosis not present

## 2022-09-02 NOTE — Telephone Encounter (Signed)
Called [name] with [facility], no answer. Left message requesting call back.   HH should have all orders in regard to PICC line and IV antibiotics as [facility] assisted with hospital discharge.   Wound Vac orders will need to come from wound care provider.   Sandie Ano, RN

## 2022-09-02 NOTE — Telephone Encounter (Signed)
Pt called and stated that Home Health was supposed to come out yesterday to change her wound vac, but they said they did not have any orders.  She is not sure what she needs to do.   Received a call right after [name] called from [facility] regarding order for wound vac change. Eliberto Ivory took message.

## 2022-09-02 NOTE — Telephone Encounter (Signed)
Jeri Modena to reach out to [facility] to ensure they have necessary orders.   Sandie Ano, RN

## 2022-09-02 NOTE — Telephone Encounter (Signed)
Angie, CMA spoke with Annabelle Harman, [facility] clinical manager for [facility]. Provided verbal orders based on previous visit notes with provider. They are going to send out a RN today for vac change

## 2022-09-02 NOTE — Telephone Encounter (Signed)
Received a call from [facility] Clinical Manager w/[facility].  Requesting verbal orders for wound vac changes.    Informed Dana,RN that in the patient's last visit note states to continue with twice weekly wound VAC changes in interim-per Garrett,PA-C.    Dana,RN verbalized understanding and agreed.//AB/CMA

## 2022-09-04 DIAGNOSIS — T8189XA Other complications of procedures, not elsewhere classified, initial encounter: Secondary | ICD-10-CM | POA: Diagnosis not present

## 2022-09-04 DIAGNOSIS — M1990 Unspecified osteoarthritis, unspecified site: Secondary | ICD-10-CM | POA: Diagnosis not present

## 2022-09-04 DIAGNOSIS — F909 Attention-deficit hyperactivity disorder, unspecified type: Secondary | ICD-10-CM | POA: Diagnosis not present

## 2022-09-04 DIAGNOSIS — I7300 Raynaud's syndrome without gangrene: Secondary | ICD-10-CM | POA: Diagnosis not present

## 2022-09-04 DIAGNOSIS — S7002XA Contusion of left hip, initial encounter: Secondary | ICD-10-CM | POA: Diagnosis not present

## 2022-09-04 DIAGNOSIS — F1911 Other psychoactive substance abuse, in remission: Secondary | ICD-10-CM | POA: Diagnosis not present

## 2022-09-04 DIAGNOSIS — S82891D Other fracture of right lower leg, subsequent encounter for closed fracture with routine healing: Secondary | ICD-10-CM | POA: Diagnosis not present

## 2022-09-04 DIAGNOSIS — G8929 Other chronic pain: Secondary | ICD-10-CM | POA: Diagnosis not present

## 2022-09-04 DIAGNOSIS — F32A Depression, unspecified: Secondary | ICD-10-CM | POA: Diagnosis not present

## 2022-09-04 DIAGNOSIS — M810 Age-related osteoporosis without current pathological fracture: Secondary | ICD-10-CM | POA: Diagnosis not present

## 2022-09-04 DIAGNOSIS — S91001A Unspecified open wound, right ankle, initial encounter: Secondary | ICD-10-CM | POA: Diagnosis not present

## 2022-09-06 DIAGNOSIS — S82891A Other fracture of right lower leg, initial encounter for closed fracture: Secondary | ICD-10-CM | POA: Diagnosis not present

## 2022-09-07 ENCOUNTER — Encounter: Payer: Self-pay | Admitting: Surgical

## 2022-09-07 ENCOUNTER — Ambulatory Visit: Payer: Commercial Managed Care - PPO | Admitting: Surgical

## 2022-09-07 VITALS — BP 135/69 | HR 88

## 2022-09-07 DIAGNOSIS — S91001D Unspecified open wound, right ankle, subsequent encounter: Secondary | ICD-10-CM

## 2022-09-07 DIAGNOSIS — S31000D Unspecified open wound of lower back and pelvis without penetration into retroperitoneum, subsequent encounter: Secondary | ICD-10-CM

## 2022-09-07 DIAGNOSIS — M869 Osteomyelitis, unspecified: Secondary | ICD-10-CM | POA: Diagnosis not present
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
## 2022-09-08 ENCOUNTER — Other Ambulatory Visit: Payer: Self-pay

## 2022-09-08 ENCOUNTER — Other Ambulatory Visit (HOSPITAL_COMMUNITY): Payer: Self-pay

## 2022-09-08 ENCOUNTER — Encounter: Payer: Commercial Managed Care - PPO | Admitting: Physician Assistant

## 2022-09-08 DIAGNOSIS — F32A Depression, unspecified: Secondary | ICD-10-CM | POA: Diagnosis not present

## 2022-09-08 DIAGNOSIS — M1990 Unspecified osteoarthritis, unspecified site: Secondary | ICD-10-CM | POA: Diagnosis not present

## 2022-09-08 DIAGNOSIS — S91001A Unspecified open wound, right ankle, initial encounter: Secondary | ICD-10-CM | POA: Diagnosis not present

## 2022-09-08 DIAGNOSIS — S7002XA Contusion of left hip, initial encounter: Secondary | ICD-10-CM | POA: Diagnosis not present

## 2022-09-08 DIAGNOSIS — G8929 Other chronic pain: Secondary | ICD-10-CM | POA: Diagnosis not present

## 2022-09-08 DIAGNOSIS — F909 Attention-deficit hyperactivity disorder, unspecified type: Secondary | ICD-10-CM | POA: Diagnosis not present

## 2022-09-08 DIAGNOSIS — S82891D Other fracture of right lower leg, subsequent encounter for closed fracture with routine healing: Secondary | ICD-10-CM | POA: Diagnosis not present

## 2022-09-08 DIAGNOSIS — F1911 Other psychoactive substance abuse, in remission: Secondary | ICD-10-CM | POA: Diagnosis not present

## 2022-09-08 DIAGNOSIS — M810 Age-related osteoporosis without current pathological fracture: Secondary | ICD-10-CM | POA: Diagnosis not present

## 2022-09-08 DIAGNOSIS — I7300 Raynaud's syndrome without gangrene: Secondary | ICD-10-CM | POA: Diagnosis not present

## 2022-09-10 ENCOUNTER — Ambulatory Visit: Payer: Commercial Managed Care - PPO | Admitting: Internal Medicine

## 2022-09-10 ENCOUNTER — Other Ambulatory Visit: Payer: Self-pay

## 2022-09-10 VITALS — BP 148/81 | HR 100 | Temp 97.5°F | Resp 16 | Wt 107.0 lb

## 2022-09-10 DIAGNOSIS — M86171 Other acute osteomyelitis, right ankle and foot: Secondary | ICD-10-CM
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
## 2022-09-11 ENCOUNTER — Other Ambulatory Visit: Payer: Self-pay

## 2022-09-11 ENCOUNTER — Other Ambulatory Visit (HOSPITAL_COMMUNITY): Payer: Self-pay

## 2022-09-11 ENCOUNTER — Encounter: Payer: Self-pay | Admitting: Internal Medicine

## 2022-09-11 ENCOUNTER — Encounter: Payer: Self-pay | Admitting: Family Medicine

## 2022-09-11 DIAGNOSIS — F909 Attention-deficit hyperactivity disorder, unspecified type: Secondary | ICD-10-CM | POA: Diagnosis not present

## 2022-09-11 DIAGNOSIS — S82891D Other fracture of right lower leg, subsequent encounter for closed fracture with routine healing: Secondary | ICD-10-CM | POA: Diagnosis not present

## 2022-09-11 DIAGNOSIS — I7300 Raynaud's syndrome without gangrene: Secondary | ICD-10-CM | POA: Diagnosis not present

## 2022-09-11 DIAGNOSIS — S91001A Unspecified open wound, right ankle, initial encounter: Secondary | ICD-10-CM | POA: Diagnosis not present

## 2022-09-11 DIAGNOSIS — F1911 Other psychoactive substance abuse, in remission: Secondary | ICD-10-CM | POA: Diagnosis not present

## 2022-09-11 DIAGNOSIS — M1990 Unspecified osteoarthritis, unspecified site: Secondary | ICD-10-CM | POA: Diagnosis not present

## 2022-09-11 DIAGNOSIS — M810 Age-related osteoporosis without current pathological fracture: Secondary | ICD-10-CM | POA: Diagnosis not present

## 2022-09-11 DIAGNOSIS — G8929 Other chronic pain: Secondary | ICD-10-CM | POA: Diagnosis not present

## 2022-09-11 DIAGNOSIS — F32A Depression, unspecified: Secondary | ICD-10-CM | POA: Diagnosis not present

## 2022-09-11 DIAGNOSIS — S7002XA Contusion of left hip, initial encounter: Secondary | ICD-10-CM | POA: Diagnosis not present

## 2022-09-11 NOTE — Assessment & Plan Note (Signed)
She is continuing on antibiotics and no changes.  Wound remains open and is followed by orthopedics and plastic surgery.  Tendon exposed.  No purulence, no surrounding erythema.   Continue with cefazolin.   I have personally spent 30 minutes involved in face-to-face and non-face-to-face activities for this patient on the day of the visit. Professional time spent includes the following activities: Preparing to see the patient (review of tests), Obtaining and/or reviewing separately obtained history (admission/discharge record), Performing a medically appropriate examination and/or evaluation , Ordering medications/tests/procedures, referring and communicating with other health care professionals, Documenting clinical information in the EMR, Independently interpreting results (not separately reported), Communicating results to the patient/family/caregiver, Counseling and educating the patient/family/caregiver and Care coordination (not separately reported).

## 2022-09-14 DIAGNOSIS — M25571 Pain in right ankle and joints of right foot: Secondary | ICD-10-CM | POA: Diagnosis not present

## 2022-09-15 ENCOUNTER — Telehealth: Payer: Self-pay | Admitting: *Deleted

## 2022-09-15 DIAGNOSIS — M810 Age-related osteoporosis without current pathological fracture: Secondary | ICD-10-CM | POA: Diagnosis not present

## 2022-09-15 DIAGNOSIS — M1990 Unspecified osteoarthritis, unspecified site: Secondary | ICD-10-CM | POA: Diagnosis not present

## 2022-09-15 DIAGNOSIS — F1911 Other psychoactive substance abuse, in remission: Secondary | ICD-10-CM | POA: Diagnosis not present

## 2022-09-15 DIAGNOSIS — F32A Depression, unspecified: Secondary | ICD-10-CM | POA: Diagnosis not present

## 2022-09-15 DIAGNOSIS — I7300 Raynaud's syndrome without gangrene: Secondary | ICD-10-CM | POA: Diagnosis not present

## 2022-09-15 DIAGNOSIS — S7002XA Contusion of left hip, initial encounter: Secondary | ICD-10-CM | POA: Diagnosis not present

## 2022-09-15 DIAGNOSIS — G8929 Other chronic pain: Secondary | ICD-10-CM | POA: Diagnosis not present

## 2022-09-15 DIAGNOSIS — S91001A Unspecified open wound, right ankle, initial encounter: Secondary | ICD-10-CM | POA: Diagnosis not present

## 2022-09-15 DIAGNOSIS — F909 Attention-deficit hyperactivity disorder, unspecified type: Secondary | ICD-10-CM | POA: Diagnosis not present

## 2022-09-15 DIAGNOSIS — S82891D Other fracture of right lower leg, subsequent encounter for closed fracture with routine healing: Secondary | ICD-10-CM | POA: Diagnosis not present

## 2022-09-15 NOTE — Telephone Encounter (Signed)
Email to [facility] to confirm CPT codes

## 2022-09-16 ENCOUNTER — Ambulatory Visit (INDEPENDENT_AMBULATORY_CARE_PROVIDER_SITE_OTHER): Payer: Commercial Managed Care - PPO | Admitting: Surgical

## 2022-09-16 ENCOUNTER — Encounter: Payer: Self-pay | Admitting: Surgical

## 2022-09-16 VITALS — BP 133/77 | HR 96 | Ht 61.5 in | Wt 107.0 lb

## 2022-09-16 DIAGNOSIS — S31000D Unspecified open wound of lower back and pelvis without penetration into retroperitoneum, subsequent encounter: Secondary | ICD-10-CM

## 2022-09-16 DIAGNOSIS — S91001D Unspecified open wound, right ankle, subsequent encounter: Secondary | ICD-10-CM | POA: Diagnosis not present

## 2022-09-16 NOTE — Progress Notes (Signed)
   Referring Provider Nelwyn Salisbury, MD [address] [place],  [place] [address]   CC:  Chief Complaint  Patient presents with   Post-op Follow-up      [name] is an 67 y.o. female.  HPI: Patient is a 67 year old female here for evaluation of her lower back wound, this has been managed with a wound VAC and she has been doing well with this.  She is receiving assistance from home health with wound VAC changes multiple times per week.  She reports this has been going well.  She unfortunately also has a right lower extremity wound with exposed tendon, she is seeing orthopedics for this.  She is not having any infectious symptoms today, but she does report that she has been fatigued.  She reports that this has been chronic since her injury.   Review of Systems General: No fevers or chills  Physical Exam    09/16/2022   10:43 AM 09/10/2022    4:02 PM 09/07/2022    1:55 PM  Vitals with BMI  Height 5' 1.5"    Weight 107 lbs 107 lbs   BMI 19.89 19.89   Systolic 133 148 062  Diastolic 77 81 69  Pulse 96 100 88    General:  No acute distress,  Alert and oriented, Non-Toxic, Normal speech and affect Breathing is unlabored.  Right lower extremity wound with exposed tendon and surrounding redness, no cellulitic changes.  No lower extremity edema is noted.  Distal extremity warm to touch.  Left back wound: 6-1/2 x 6.4 x 0.7 cm.  Along the inferior portion she does have some rolled borders.  I do not appreciate any significant tracking at this time, this has seemed to improved.  There is no surrounding erythema or cellulitic changes.  There is some maceration of the inferior skin due to the wound VAC  Assessment/Plan Left back wound VAC was changed, patient tolerated this well.  There is no signs of infection or concern related to the back wound.  We did discuss that the rolled border of the skin may cause some difficulty with healing, but we will continue to monitor  this.  We discussed that surgical intervention may be necessary to debride the skin edge borders.  Recommend continue to follow-up with Ortho in regards to the right lower extremity wound, we did evaluate today, however discussed with patient that given the exposed tendon feel as if Ortho should continue to manage the wound and we can be of assistance if needed.  Recommend following up in 2 weeks for reevaluation.  Home health to continue to change VAC and home 2-3 times per week.  Pictures were obtained of the patient and placed in the chart with the patient's or guardian's permission.   [name] 09/16/2022, 12:50 PM

## 2022-09-17 DIAGNOSIS — M869 Osteomyelitis, unspecified: Secondary | ICD-10-CM | POA: Diagnosis not present

## 2022-09-18 ENCOUNTER — Encounter (HOSPITAL_BASED_OUTPATIENT_CLINIC_OR_DEPARTMENT_OTHER): Payer: Self-pay | Admitting: Plastic Surgery

## 2022-09-18 ENCOUNTER — Other Ambulatory Visit: Payer: Self-pay

## 2022-09-18 ENCOUNTER — Telehealth: Payer: Self-pay | Admitting: *Deleted

## 2022-09-18 DIAGNOSIS — S82891D Other fracture of right lower leg, subsequent encounter for closed fracture with routine healing: Secondary | ICD-10-CM | POA: Diagnosis not present

## 2022-09-18 DIAGNOSIS — G8929 Other chronic pain: Secondary | ICD-10-CM | POA: Diagnosis not present

## 2022-09-18 DIAGNOSIS — S7002XA Contusion of left hip, initial encounter: Secondary | ICD-10-CM | POA: Diagnosis not present

## 2022-09-18 DIAGNOSIS — F32A Depression, unspecified: Secondary | ICD-10-CM | POA: Diagnosis not present

## 2022-09-18 DIAGNOSIS — S91001A Unspecified open wound, right ankle, initial encounter: Secondary | ICD-10-CM | POA: Diagnosis not present

## 2022-09-18 DIAGNOSIS — M810 Age-related osteoporosis without current pathological fracture: Secondary | ICD-10-CM | POA: Diagnosis not present

## 2022-09-18 DIAGNOSIS — F1911 Other psychoactive substance abuse, in remission: Secondary | ICD-10-CM | POA: Diagnosis not present

## 2022-09-18 DIAGNOSIS — I7300 Raynaud's syndrome without gangrene: Secondary | ICD-10-CM | POA: Diagnosis not present

## 2022-09-18 DIAGNOSIS — M1990 Unspecified osteoarthritis, unspecified site: Secondary | ICD-10-CM | POA: Diagnosis not present

## 2022-09-18 DIAGNOSIS — F909 Attention-deficit hyperactivity disorder, unspecified type: Secondary | ICD-10-CM | POA: Diagnosis not present

## 2022-09-18 NOTE — Telephone Encounter (Signed)
Notified patient that surgery for 8/28 is cancelled and that her Ortho will be handling her ankle wound. Scheduled a follow up in office for 8/27 for her back wound.

## 2022-09-22 ENCOUNTER — Ambulatory Visit (INDEPENDENT_AMBULATORY_CARE_PROVIDER_SITE_OTHER): Payer: Commercial Managed Care - PPO | Admitting: Surgical

## 2022-09-22 VITALS — BP 130/81 | HR 120

## 2022-09-22 DIAGNOSIS — S31000D Unspecified open wound of lower back and pelvis without penetration into retroperitoneum, subsequent encounter: Secondary | ICD-10-CM

## 2022-09-22 DIAGNOSIS — T148XXA Other injury of unspecified body region, initial encounter: Secondary | ICD-10-CM

## 2022-09-22 DIAGNOSIS — T8189XA Other complications of procedures, not elsewhere classified, initial encounter: Secondary | ICD-10-CM | POA: Diagnosis not present

## 2022-09-22 NOTE — Progress Notes (Signed)
Referring Provider Nelwyn Salisbury, MD [address] [place],  [place] [address]   CC:  Chief Complaint  Patient presents with   Follow-up      [name] is an 67 y.o. female.  HPI: Patient is a 67 year old female here for follow-up on her lower back wound, we have been managing this with a wound VAC and she has been receiving assistance with wound VAC changes from home health.  She reports home health is scheduled to come on Friday for VAC change.  She reports her back has been feeling fine, she has been tolerating the wound VAC fine without any issues.  She is not having any infectious symptoms.  She does report ongoing fatigue, reports that she has been supplementing her diet with protein shakes for optimal healing and taking multivitamins.  She feels as if this has been helpful.  She has also been trying to increase hydration.  She is not having any cardiac or pulmonary symptoms.  Review of Systems General: No fevers or chills  Physical Exam    09/22/2022    1:27 PM 09/18/2022    8:35 AM 09/16/2022   10:43 AM  Vitals with BMI  Height  5' 1.5" 5' 1.5"  Weight  107 lbs 107 lbs  BMI  19.89 19.89  Systolic 130  133  Diastolic 81  77  Pulse 120  96    General:  No acute distress,  Alert and oriented, Non-Toxic, Normal speech and affect.  Breathing is unlabored. Back: Back wound is 6 x 5 cm.  There is no surrounding erythema or cellulitic changes noted. Superiorly the granulation tissue is completely flush with the surrounding epithelium.  Inferiorly she does have a deeper portion of the wound which is slowly granulating, there is some rolled borders of the inferior skin edges noted.  There is no active drainage noted there is no foul odors or crepitus noted in the periwound area    Assessment/Plan Patient is a 67 year old female with a left lower back wound that she sustained after a trauma, we have been managing the wound with wound VAC changes twice per  week.  She has been tolerating this well and has not had any issues with the wound VAC at this time. She has been feeling very fatigued lately, but she is also being treated for a right lower extremity wound by orthopedics.  She has been increasing her protein in her diet, taking a multivitamin and staying hydrated.  Recommend following up in 1 week for reevaluation, I do not see any signs of infection or concern on exam.  I discussed with patient that I will discuss her case further with Dr. Ulice Bold and update her in regards to the plan.  We did discuss that the rolled edges of the lower portion of her back wound would potentially cause a delay in her healing and may need surgical debridement, however with her fatigue and recent orthopedic surgery, may benefit from continued local wound care and address surgical debridement once she is feeling much better.  We will start with collagen dressing changes under the wound VAC, recommend covering this with Adaptic and then applying the black wound VAC sponge.  Will send new orders to home health RN for starting this on Friday and follow-up with [place] next week.  Pictures were obtained of the patient and placed in the chart with the patient's or guardian's permission.   [name] Sim Choquette 09/22/2022, 2:33 PM

## 2022-09-23 ENCOUNTER — Ambulatory Visit (HOSPITAL_BASED_OUTPATIENT_CLINIC_OR_DEPARTMENT_OTHER): Admit: 2022-09-23 | Payer: Commercial Managed Care - PPO | Admitting: Plastic Surgery

## 2022-09-23 DIAGNOSIS — M25571 Pain in right ankle and joints of right foot: Secondary | ICD-10-CM | POA: Diagnosis not present

## 2022-09-23 DIAGNOSIS — Z01818 Encounter for other preprocedural examination: Secondary | ICD-10-CM

## 2022-09-23 SURGERY — APPLICATION OF SKIN SUBSTITUTE
Anesthesia: General | Site: Back | Laterality: Right

## 2022-09-24 DIAGNOSIS — T8189XA Other complications of procedures, not elsewhere classified, initial encounter: Secondary | ICD-10-CM | POA: Diagnosis not present

## 2022-09-25 DIAGNOSIS — F909 Attention-deficit hyperactivity disorder, unspecified type: Secondary | ICD-10-CM | POA: Diagnosis not present

## 2022-09-25 DIAGNOSIS — S7002XA Contusion of left hip, initial encounter: Secondary | ICD-10-CM | POA: Diagnosis not present

## 2022-09-25 DIAGNOSIS — I7300 Raynaud's syndrome without gangrene: Secondary | ICD-10-CM | POA: Diagnosis not present

## 2022-09-25 DIAGNOSIS — S82891D Other fracture of right lower leg, subsequent encounter for closed fracture with routine healing: Secondary | ICD-10-CM | POA: Diagnosis not present

## 2022-09-25 DIAGNOSIS — S91001A Unspecified open wound, right ankle, initial encounter: Secondary | ICD-10-CM | POA: Diagnosis not present

## 2022-09-25 DIAGNOSIS — G8929 Other chronic pain: Secondary | ICD-10-CM | POA: Diagnosis not present

## 2022-09-25 DIAGNOSIS — M810 Age-related osteoporosis without current pathological fracture: Secondary | ICD-10-CM | POA: Diagnosis not present

## 2022-09-25 DIAGNOSIS — F1911 Other psychoactive substance abuse, in remission: Secondary | ICD-10-CM | POA: Diagnosis not present

## 2022-09-25 DIAGNOSIS — F32A Depression, unspecified: Secondary | ICD-10-CM | POA: Diagnosis not present

## 2022-09-25 DIAGNOSIS — M1990 Unspecified osteoarthritis, unspecified site: Secondary | ICD-10-CM | POA: Diagnosis not present

## 2022-09-25 DIAGNOSIS — M869 Osteomyelitis, unspecified: Secondary | ICD-10-CM | POA: Diagnosis not present

## 2022-09-28 ENCOUNTER — Other Ambulatory Visit: Payer: Self-pay | Admitting: Family Medicine

## 2022-09-28 ENCOUNTER — Other Ambulatory Visit (HOSPITAL_COMMUNITY): Payer: Self-pay

## 2022-09-29 ENCOUNTER — Encounter: Payer: Self-pay | Admitting: Internal Medicine

## 2022-09-29 ENCOUNTER — Telehealth: Payer: Self-pay

## 2022-09-29 ENCOUNTER — Other Ambulatory Visit: Payer: Self-pay

## 2022-09-29 ENCOUNTER — Ambulatory Visit (INDEPENDENT_AMBULATORY_CARE_PROVIDER_SITE_OTHER): Payer: Commercial Managed Care - PPO | Admitting: Internal Medicine

## 2022-09-29 VITALS — BP 129/69 | HR 81 | Temp 97.6°F | Resp 16 | Wt 107.4 lb

## 2022-09-29 DIAGNOSIS — Z452 Encounter for adjustment and management of vascular access device: Secondary | ICD-10-CM | POA: Insufficient documentation

## 2022-09-29 DIAGNOSIS — M86171 Other acute osteomyelitis, right ankle and foot: Secondary | ICD-10-CM

## 2022-09-29 DIAGNOSIS — Z5181 Encounter for therapeutic drug level monitoring: Secondary | ICD-10-CM

## 2022-09-29 NOTE — Assessment & Plan Note (Signed)
Ankle is healing well, filling in.  No signs of infection in or around the wound.  No changes to plan.  Continue antibiotics through tomorrow then can stop.   She will continue to follow up with Dr. Susa Simmonds  She can follow up here as needed

## 2022-09-29 NOTE — Progress Notes (Signed)
   Subjective:    Patient ID: [name], female    DOB: 10-20-1955, 67 y.o.   MRN: [id number]  HPI Geraline is here for follow up of osteomyelitis.  She has a history of ORIF of the right ankle following an accident complicated by wound dehiscence and osteomyelitis on CT scan.  She underwent removal of hardware and debridement on 7/25 by Dr. Susa Simmonds and cultures with Staph and Strep.  Placed on IV cefazolin with plan for treatment through 10/01/22.  She is here for follow up with no complaints.  Doing well.  No associated rash or diarrhea.    Review of Systems  Constitutional:  Negative for fatigue and fever.  Gastrointestinal:  Negative for diarrhea.  Skin:  Negative for rash.       Objective:   Physical Exam Eyes:     General: No scleral icterus. Pulmonary:     Effort: Pulmonary effort is normal.  Neurological:     Mental Status: She is alert.   SH: no tobacco        Assessment & Plan:

## 2022-09-29 NOTE — Assessment & Plan Note (Signed)
Recent labs unable to be run.  Will continue to monitor labs.

## 2022-09-29 NOTE — Assessment & Plan Note (Signed)
Picc line working well, no concerns.  Will have HH remove after her last dose tomorrow, 09/30/22

## 2022-09-29 NOTE — Telephone Encounter (Signed)
Per Dr.Comer - Pull PICC line after last dose of IV abx on 09/30/2022. Message sent to Jeri Modena, RN and [facility] pharmacists.    Chonda Baney Lesli Albee, CMA

## 2022-09-30 ENCOUNTER — Encounter: Payer: Commercial Managed Care - PPO | Admitting: Surgical

## 2022-09-30 ENCOUNTER — Ambulatory Visit (INDEPENDENT_AMBULATORY_CARE_PROVIDER_SITE_OTHER): Payer: Commercial Managed Care - PPO | Admitting: Surgical

## 2022-09-30 ENCOUNTER — Telehealth: Payer: Self-pay

## 2022-09-30 VITALS — BP 119/76 | HR 100

## 2022-09-30 DIAGNOSIS — S31000D Unspecified open wound of lower back and pelvis without penetration into retroperitoneum, subsequent encounter: Secondary | ICD-10-CM

## 2022-09-30 DIAGNOSIS — T148XXA Other injury of unspecified body region, initial encounter: Secondary | ICD-10-CM

## 2022-09-30 MED ORDER — AMPHETAMINE-DEXTROAMPHET ER 20 MG PO CP24
20.0000 mg | ORAL_CAPSULE | ORAL | 0 refills | Status: DC
  Filled 2022-10-06: qty 30, 30d supply, fill #0
  Filled ????-??-??: fill #0

## 2022-09-30 MED ORDER — AMPHETAMINE-DEXTROAMPHET ER 20 MG PO CP24
20.0000 mg | ORAL_CAPSULE | ORAL | 0 refills | Status: DC
  Filled 2022-11-12: qty 30, 30d supply, fill #0
  Filled ????-??-??: fill #0

## 2022-09-30 NOTE — Telephone Encounter (Signed)
Done

## 2022-09-30 NOTE — Progress Notes (Signed)
   Referring Provider Nelwyn Salisbury, MD [address] [place],  [place] [address]   CC:  Chief Complaint  Patient presents with   Post-op Follow-up      Teela Egert is an 67 y.o. female.  HPI: Patient is a 67 year old female here for follow-up on her lower back wound.  We have been managing the wound with a wound VAC and she has been receiving assistance from home health with wound VAC changes at home once per week.  She reports she is doing really well today, she reports her energy has been much better over the past week or so.  She is not having any infectious symptoms.  She is excited because she is going to have her PICC line removed soon.  She reports she continues to take multivitamins and drink protein shakes and feels as if this has been helpful.   Review of Systems General: No fevers or chills  Physical Exam    09/30/2022    3:09 PM 09/29/2022    9:33 AM 09/22/2022    1:27 PM  Vitals with BMI  Weight  107 lbs 6 oz   BMI  19.97   Systolic 119 129 604  Diastolic 76 69 81  Pulse 100 81 120    General:  No acute distress,  Alert and oriented, Non-Toxic, Normal speech and affect Left back wound: The wound is 6 x 5 x 0.6 cm.  There is no surrounding erythema or cellulitic changes noted.  Significant amount of granulation tissue noted along the inferior most border.  She does have rolled skin edges on the inferior most border which is likely going to prevent epithelialization.  There is no active drainage noted.  No foul odors or crepitus noted.  Assessment/Plan Patient is a very pleasant 67 year old female here for left lower back wound that she sustained after a trauma.  We have been managing the wound with twice weekly VAC changes with assistance from home health.  Recently she has had an improvement in her overall energy levels and attributes this to increasing protein and taking multivitamins.  She has had a significant improvement in the back wound, we  will plan a 1 week VAC holiday and reevaluate next week.  We discussed starting collagen dressing changes followed by Adaptic, K-Y jelly, 4 x 4 gauze and Mepilex border dressing.  We will plan to change these dressings Fridays, Mondays and Wednesdays.  We will plan to see the patient next week on Wednesday for follow-up.  We will send orders to home health for assistance.  Pictures were obtained of the patient and placed in the chart with the patient's or guardian's permission.   [name] Tasheem Elms 09/30/2022, 3:29 PM

## 2022-09-30 NOTE — Telephone Encounter (Signed)
Pt has PMV on 10/02/22

## 2022-09-30 NOTE — Telephone Encounter (Signed)
L/M for [name], Development worker, international aid, regarding new orders for patient. Transitioning from wound VAC to dressings twice a week.

## 2022-09-30 NOTE — Telephone Encounter (Signed)
Received call from patient requesting our office give her home health team [facility] a call to inform them of pull picc line orders. Staff advised that this was communicated with [facility] team who will communicate orders to [facility].   Staff also called [facility] to ensure they have orders. Orders confirmed per [name] and will communicate this information to [facility]. Valarie Cones, LPN

## 2022-09-30 NOTE — Telephone Encounter (Signed)
Spoke to [employer], Development worker, international aid, with [facility]: Phone: [phone], Fax: [phone]. Recert done over the phone.  Verbal orders given for [facility] nurse to do the following:  -Wound cleanser -Collagen sheet -Adaptic -Surgilube -Cover with Bordered Mepilex Dressing  Wound dressings to be changed Mondays and Fridays, while we will change on Wednesdays.

## 2022-10-01 ENCOUNTER — Other Ambulatory Visit (HOSPITAL_COMMUNITY): Payer: Self-pay

## 2022-10-02 ENCOUNTER — Telehealth (INDEPENDENT_AMBULATORY_CARE_PROVIDER_SITE_OTHER): Payer: Commercial Managed Care - PPO | Admitting: Family Medicine

## 2022-10-02 ENCOUNTER — Encounter: Payer: Self-pay | Admitting: Family Medicine

## 2022-10-02 DIAGNOSIS — M159 Polyosteoarthritis, unspecified: Secondary | ICD-10-CM | POA: Diagnosis not present

## 2022-10-02 DIAGNOSIS — F1190 Opioid use, unspecified, uncomplicated: Secondary | ICD-10-CM

## 2022-10-02 DIAGNOSIS — T8131XD Disruption of external operation (surgical) wound, not elsewhere classified, subsequent encounter: Secondary | ICD-10-CM | POA: Diagnosis not present

## 2022-10-02 DIAGNOSIS — F32A Depression, unspecified: Secondary | ICD-10-CM | POA: Diagnosis not present

## 2022-10-02 DIAGNOSIS — F909 Attention-deficit hyperactivity disorder, unspecified type: Secondary | ICD-10-CM | POA: Diagnosis not present

## 2022-10-02 DIAGNOSIS — Z452 Encounter for adjustment and management of vascular access device: Secondary | ICD-10-CM | POA: Diagnosis not present

## 2022-10-02 DIAGNOSIS — M1990 Unspecified osteoarthritis, unspecified site: Secondary | ICD-10-CM | POA: Diagnosis not present

## 2022-10-02 DIAGNOSIS — T8469XD Infection and inflammatory reaction due to internal fixation device of other site, subsequent encounter: Secondary | ICD-10-CM | POA: Diagnosis not present

## 2022-10-02 DIAGNOSIS — M810 Age-related osteoporosis without current pathological fracture: Secondary | ICD-10-CM | POA: Diagnosis not present

## 2022-10-02 DIAGNOSIS — M869 Osteomyelitis, unspecified: Secondary | ICD-10-CM | POA: Diagnosis not present

## 2022-10-02 DIAGNOSIS — F1911 Other psychoactive substance abuse, in remission: Secondary | ICD-10-CM | POA: Diagnosis not present

## 2022-10-02 DIAGNOSIS — G894 Chronic pain syndrome: Secondary | ICD-10-CM | POA: Diagnosis not present

## 2022-10-02 MED ORDER — OXYCODONE HCL 20 MG PO TABS: 20.0000 mg | ORAL_TABLET | Freq: Four times a day (QID) | ORAL | 0 refills | Status: DC | PRN

## 2022-10-02 NOTE — Progress Notes (Signed)
Subjective:    Patient ID: [name], female    DOB: 19-Aug-1955, 67 y.o.   MRN: [id number]  HPI Virtual Visit via Video Note  I connected with the patient on 10/02/22 at 10:30 AM EDT by a video enabled telemedicine application and verified that I am speaking with the correct person using two identifiers.  Location patient: home Location provider:work or home office Persons participating in the virtual visit: patient, provider  I discussed the limitations of evaluation and management by telemedicine and the availability of in person appointments. The patient expressed understanding and agreed to proceed.   HPI: Here for pain management. She has been steadily improving since her last ankle surgery. She had been using Hydromorphone along with Oxycodone for pain control, but she no longer takes any Hydromorphone. She had been treated for a wound infection with IV antibiotics via a PICC line, but these are finished. Home Health nurses are coming today to remove the PICC line. The wound vac has already been removed. Overall her  pain levels have come back down to her baseline before the recent traumatic events.    ROS: See pertinent positives and negatives per HPI.  Past Medical History:  Diagnosis Date   ADHD (attention deficit hyperactivity disorder)    Anemia    Anxiety    ARDS (adult respiratory distress syndrome) (HCC) 2007   Arthritis    Depression    Gallstones 01/07/2018   Ganglion cyst of dorsum of right wrist    Headache    Hepatitis    2004 non detectable now   Hypertension    MRSA (methicillin resistant Staphylococcus aureus) 2007   Osteoporosis    Pneumonia 2009   Prosthetic eye globe    right eye    Raynaud's disease     Past Surgical History:  Procedure Laterality Date   ABDOMINAL HYSTERECTOMY     ANTERIOR CERVICAL DECOMP/DISCECTOMY FUSION N/A 12/12/2014   Procedure: ANTERIOR CERVICAL DECOMPRESSION/DISCECTOMY FUSION 3 LEVELS;  Surgeon: Estill Bamberg, MD;  Location: [facility] OR;  Service: Orthopedics;  Laterality: N/A;  Anterior cervical decompression fusion, cerivcal 5-6, cervical 6-7, cervical 7-thoracic 1 with instrumentation and allografrt   APLIGRAFT PLACEMENT Right 04/16/2022   Procedure: APPLICATION OF SKIN SUBSTITUTE GRAFT;  Surgeon: Terance Hart, MD;  Location: [facility] OR;  Service: Orthopedics;  Laterality: Right;   APPENDECTOMY  1974   APPLICATION OF WOUND VAC Left 07/22/2022   Procedure: APPLICATION OF WOUND VAC AND MYRIAD;  Surgeon: Peggye Form, DO;  Location: [facility] OR;  Service: Plastics;  Laterality: Left;   CATARACT EXTRACTION Left 11/2018   CHOLECYSTECTOMY N/A 01/07/2018   Procedure: LAPAROSCOPIC CHOLECYSTECTOMY WITH INTRAOPERATIVE CHOLANGIOGRAM;  Surgeon: Claud Kelp, MD;  Location: [facility] OR;  Service: General;  Laterality: N/A;   COLONOSCOPY  04/14/2013   per Dr. Christella Hartigan, clear, repeat in 10 yrs    CORNEAL TRANSPLANT Right 2007   x4   ENUCLEATION Right 2013   eye infection   GANGLION CYST EXCISION Right 05/25/2016   Procedure: RIGHT WRIST ULNAR GANGLION EXCISION;  Surgeon: Mack Hook, MD;  Location: [facility];  Service: Orthopedics;  Laterality: Right;   HARDWARE REMOVAL Right 08/20/2022   Procedure: HARDWARE REMOVAL;  Surgeon: Terance Hart, MD;  Location: [facility] OR;  Service: Orthopedics;  Laterality: Right;   HEMATOMA EVACUATION Left 07/22/2022   Procedure: EVACUATION HEMATOMA LEFT BACK WITH EXCISION OF WOUND;  Surgeon: Peggye Form, DO;  Location: [facility] OR;  Service: Plastics;  Laterality: Left;  I & D EXTREMITY Right 05/28/2022   Procedure: right ankle wound debridement with myriad placement;  Surgeon: Peggye Form, DO;  Location: [facility];  Service: Plastics;  Laterality: Right;   I & D EXTREMITY Right 08/20/2022   Procedure: IRRIGATION AND DEBRIDEMENT EXTREMITY WITH WOUND VAC PLACEMENT;  Surgeon: Terance Hart, MD;  Location: [facility] OR;  Service:  Orthopedics;  Laterality: Right;   INCISION AND DRAINAGE Right 07/22/2022   Procedure: INCISION AND DRAINAGE RIGHT ANKLE;  Surgeon: Peggye Form, DO;  Location: [facility] OR;  Service: Plastics;  Laterality: Right;   MINOR HARDWARE REMOVAL Right 04/16/2022   Procedure: REPAIR OF SURGICAL WOUND DEHISCENCE RIGHT ANKLE, REMOVAL OF DEEP ORTHOPEDIC HARDWARE;  Surgeon: Terance Hart, MD;  Location: [facility] OR;  Service: Orthopedics;  Laterality: Right;   ORIF CLAVICULAR FRACTURE Left 02/03/2022   Procedure: OPEN REDUCTION INTERNAL FIXATION (ORIF) LEFT CLAVICULAR FRACTURE WITH CORACOLAVICULAR FIXATION;  Surgeon: Jones Broom, MD;  Location: [facility] OR;  Service: Orthopedics;  Laterality: Left;  BIOMET ZIP LOOP WITH CLAVICLE PLATES   ORIF FIBULA FRACTURE Right 02/03/2022   Procedure: OPEN TREATMENT OF RIGHT PILON ANKLE FRACTURE INCLUDING LATERAL MALLEOLUS FRACTURE;  Surgeon: Terance Hart, MD;  Location: [facility] OR;  Service: Orthopedics;  Laterality: Right;  LENGTH OF SURGERY: 120 MINUTES   REVERSE SHOULDER ARTHROPLASTY Right 07/10/2021   Procedure: REVERSE SHOULDER ARTHROPLASTY;  Surgeon: Jones Broom, MD;  Location: [facility];  Service: Orthopedics;  Laterality: Right;   RHINOPLASTY  1980   ROBOTIC ASSISTED LAPAROSCOPIC SACROCOLPOPEXY Bilateral 06/13/2020   Procedure: XI ROBOTIC ASSISTED LAPAROSCOPIC SACROCOLPOPEXY AND SUPRACERVICAL HYSTERECTOMY AND BILATERAL  SALPINGO OOPHERECTOMY;  Surgeon: Crist Fat, MD;  Location: [facility] ORS;  Service: Urology;  Laterality: Bilateral;   SYNDESMOSIS REPAIR Right 02/03/2022   Procedure: POSSIBLE OPEN TREATMENT RIGHT SYNDESMOSIS;  Surgeon: Terance Hart, MD;  Location: [facility] OR;  Service: Orthopedics;  Laterality: Right;   TONSILLECTOMY     removed in 3rd grade   TOTAL SHOULDER ARTHROPLASTY Left 03/13/2019   Procedure: TOTAL SHOULDER ARTHROPLASTY;  Surgeon: Jones Broom, MD;  Location: [facility];  Service: Orthopedics;  Laterality: Left;     Family History  Problem Relation Age of Onset   Breast cancer Maternal Aunt 60   Colon cancer Neg Hx      Current Outpatient Medications:    Acetaminophen Extra Strength 500 MG TABS, Take 1,000 mg by mouth as needed (pain)., Disp: , Rfl:    [START ON 10/05/2022] amphetamine-dextroamphetamine (ADDERALL XR) 20 MG 24 hr capsule, Take 1 capsule (20 mg total) by mouth every morning., Disp: 30 capsule, Rfl: 0   [START ON 10/05/2022] amphetamine-dextroamphetamine (ADDERALL XR) 20 MG 24 hr capsule, Take 1 capsule (20 mg total) by mouth every morning., Disp: 30 capsule, Rfl: 0   [START ON 10/05/2022] amphetamine-dextroamphetamine (ADDERALL XR) 20 MG 24 hr capsule, Take 1 capsule (20 mg total) by mouth every morning., Disp: 30 capsule, Rfl: 0   celecoxib (CELEBREX) 200 MG capsule, Take 1 capsule (200 mg total) by mouth 2 (two) times daily., Disp: 60 capsule, Rfl: 0   cyanocobalamin 1000 MCG tablet, Take 1 tablet (1,000 mcg total) by mouth daily., Disp: 30 tablet, Rfl: 0   cycloSPORINE (RESTASIS) 0.05 % ophthalmic emulsion, Place 1 drop into both eyes 2 times daily. (Patient taking differently: Place 2 drops into both eyes 2 (two) times daily.), Disp: 60 each, Rfl: 11   estradiol (ESTRACE VAGINAL) 0.1 MG/GM vaginal cream, Apply a fingertip amount to  urethra 2 times per week. (Patient taking differently: Place 1 Applicatorful vaginally once a week.), Disp: 42.5 g, Rfl: 3   nystatin (MYCOSTATIN) 100000 UNIT/ML suspension, Take 5 mLs (500,000 Units total) by mouth 4 (four) times daily. (Patient taking differently: Take 5 mLs by mouth as needed (thrush).), Disp: 473 mL, Rfl: 2   ondansetron (ZOFRAN) 4 MG tablet, Take 1 tablet (4 mg total) by mouth every 6 (six) hours as needed for nausea., Disp: 20 tablet, Rfl: 0   Oxycodone HCl 20 MG TABS, Take 1 tablet (20 mg total) by mouth every 6 (six) hours as needed (pain)., Disp: 120 tablet, Rfl: 0   Oxycodone HCl 20 MG TABS, Take 1 tablet (20 mg total) by mouth every 6  (six) hours as needed (pain)., Disp: 120 tablet, Rfl: 0   Oxycodone HCl 20 MG TABS, Take 1 tablet (20 mg total) by mouth every 6 (six) hours as needed (pain)., Disp: 120 tablet, Rfl: 0   polyethylene glycol powder (GLYCOLAX/MIRALAX) powder, TAKE 17 GRAMS BY MOUTH 2 TIMES DAILY AS NEEDED. (Patient taking differently: Take 17 g by mouth as needed for moderate constipation.), Disp: 3162 g, Rfl: 1   pregabalin (LYRICA) 300 MG capsule, Take 1 capsule (300 mg total) by mouth 2 (two) times daily., Disp: 60 capsule, Rfl: 5   senna-docusate (SENOKOT-S) 8.6-50 MG tablet, Take 1 tablet by mouth 2 (two) times daily., Disp: 60 tablet, Rfl: 0   valACYclovir (VALTREX) 1000 MG tablet, Take 1 tablet (1,000 mg total) by mouth 3 (three) times daily. (Patient taking differently: Take 1,000 mg by mouth daily.), Disp: 30 tablet, Rfl: 1   verapamil (CALAN-SR) 120 MG CR tablet, Take 1 tablet (120 mg total) by mouth at bedtime., Disp: 90 tablet, Rfl: 0  EXAM:  VITALS per patient if applicable:  GENERAL: alert, oriented, appears well and in no acute distress  HEENT: atraumatic, conjunttiva clear, no obvious abnormalities on inspection of external nose and ears  NECK: normal movements of the head and neck  LUNGS: on inspection no signs of respiratory distress, breathing rate appears normal, no obvious gross SOB, gasping or wheezing  CV: no obvious cyanosis  MS: moves all visible extremities without noticeable abnormality  PSYCH/NEURO: pleasant and cooperative, no obvious depression or anxiety, speech and thought processing grossly intact  ASSESSMENT AND PLAN: Pain management. Indication for chronic opioid: OA Medication and dose: Oxycodone 20 mg  # pills per month: 120 Last UDS date: 05-27-22 Opioid Treatment Agreement signed (Y/N): 04-30-17 Opioid Treatment Agreement last reviewed with patient:  10-02-22 [facility] reviewed this encounter (include red flags): Yes Meds were refilled.  Gershon Crane, MD  Discussed the  following assessment and plan:  No diagnosis found.     I discussed the assessment and treatment plan with the patient. The patient was provided an opportunity to ask questions and all were answered. The patient agreed with the plan and demonstrated an understanding of the instructions.   The patient was advised to call back or seek an in-person evaluation if the symptoms worsen or if the condition fails to improve as anticipated.      Review of Systems     Objective:   Physical Exam        Assessment & Plan:

## 2022-10-05 DIAGNOSIS — M810 Age-related osteoporosis without current pathological fracture: Secondary | ICD-10-CM | POA: Diagnosis not present

## 2022-10-05 DIAGNOSIS — T8469XD Infection and inflammatory reaction due to internal fixation device of other site, subsequent encounter: Secondary | ICD-10-CM | POA: Diagnosis not present

## 2022-10-05 DIAGNOSIS — Z452 Encounter for adjustment and management of vascular access device: Secondary | ICD-10-CM | POA: Diagnosis not present

## 2022-10-05 DIAGNOSIS — F32A Depression, unspecified: Secondary | ICD-10-CM | POA: Diagnosis not present

## 2022-10-05 DIAGNOSIS — F1911 Other psychoactive substance abuse, in remission: Secondary | ICD-10-CM | POA: Diagnosis not present

## 2022-10-05 DIAGNOSIS — G894 Chronic pain syndrome: Secondary | ICD-10-CM | POA: Diagnosis not present

## 2022-10-05 DIAGNOSIS — T8131XD Disruption of external operation (surgical) wound, not elsewhere classified, subsequent encounter: Secondary | ICD-10-CM | POA: Diagnosis not present

## 2022-10-05 DIAGNOSIS — M869 Osteomyelitis, unspecified: Secondary | ICD-10-CM | POA: Diagnosis not present

## 2022-10-05 DIAGNOSIS — M1990 Unspecified osteoarthritis, unspecified site: Secondary | ICD-10-CM | POA: Diagnosis not present

## 2022-10-05 DIAGNOSIS — F909 Attention-deficit hyperactivity disorder, unspecified type: Secondary | ICD-10-CM | POA: Diagnosis not present

## 2022-10-06 ENCOUNTER — Other Ambulatory Visit: Payer: Self-pay

## 2022-10-06 ENCOUNTER — Other Ambulatory Visit (HOSPITAL_COMMUNITY): Payer: Self-pay

## 2022-10-07 ENCOUNTER — Ambulatory Visit (INDEPENDENT_AMBULATORY_CARE_PROVIDER_SITE_OTHER): Payer: Commercial Managed Care - PPO | Admitting: Surgical

## 2022-10-07 ENCOUNTER — Encounter: Payer: Self-pay | Admitting: Surgical

## 2022-10-07 VITALS — BP 148/77

## 2022-10-07 DIAGNOSIS — S82891A Other fracture of right lower leg, initial encounter for closed fracture: Secondary | ICD-10-CM | POA: Diagnosis not present

## 2022-10-07 DIAGNOSIS — S31000D Unspecified open wound of lower back and pelvis without penetration into retroperitoneum, subsequent encounter: Secondary | ICD-10-CM | POA: Diagnosis not present

## 2022-10-07 NOTE — Progress Notes (Signed)
Patient is a 67 year old female here for follow-up on her lower back wound.  At her last appointment we gave her a wound VAC holiday, she reports that she has been doing very well since the wound VAC has been discontinued temporarily.  She reports she has been sleeping much better and overall feels much better after increasing her protein and taking a multivitamin.  She reports today that she has concerns because she received a letter in the mail from [place] state that her surgery on 07/22/2022 is not going to be covered by insurance because of not being experimental.  She provided me with a letter today and asked if we could look into this for her.  Chaperone present on exam On the left back wound is 6 x 5 x 0.5 cm.  There is no surrounding erythema or cellulitic changes noted.  There is no active drainage noted, however the gauze do have some yellowish exudate noted on the dressing.  There is no surrounding crepitus, tenderness with palpation.  She continues to have a rolled border on the medial inferior aspect of the wound.  The rest the wound bed is nearly 100% granulation tissue.  She does have what appears to be some black wound VAC sponge along the superior and lateral inferior border.  She also has a thin biofilm noted over the inferior aspect of the wound.    A/P:  Using a curette and a sterile technique the superior lateral and inferior portion of the wound was debrided, patient tolerated this well and reported she had no discomfort during the procedure.  The area was then cleansed with Vashe soaked gauze for 1 to 2 minutes.  Light pressure was held and then a silver nitrate stick was used to chemically cauterize any bleeding.  Donated collagen powder was then applied in the wound bed followed by Adaptic, K-Y jelly, 4 x 4 gauze and a Mepilex border dressing.  Recommend following up in 1 week for reevaluation.  There is no signs of infection or concern on exam.  We discussed that she will  likely need surgery in the future for the rolled border edge on the inferior medial aspect, however would like to see the wound continue to progress prior to then.  Discussed with patient that I would forward the [facility] information to our surgical scheduling team for evaluation.  Recommend continuing with home health assistance on Fridays and Mondays and we will continue to see the patient on Wednesdays.

## 2022-10-09 DIAGNOSIS — F1911 Other psychoactive substance abuse, in remission: Secondary | ICD-10-CM | POA: Diagnosis not present

## 2022-10-09 DIAGNOSIS — M810 Age-related osteoporosis without current pathological fracture: Secondary | ICD-10-CM | POA: Diagnosis not present

## 2022-10-09 DIAGNOSIS — M1990 Unspecified osteoarthritis, unspecified site: Secondary | ICD-10-CM | POA: Diagnosis not present

## 2022-10-09 DIAGNOSIS — F32A Depression, unspecified: Secondary | ICD-10-CM | POA: Diagnosis not present

## 2022-10-09 DIAGNOSIS — G894 Chronic pain syndrome: Secondary | ICD-10-CM | POA: Diagnosis not present

## 2022-10-09 DIAGNOSIS — T8131XD Disruption of external operation (surgical) wound, not elsewhere classified, subsequent encounter: Secondary | ICD-10-CM | POA: Diagnosis not present

## 2022-10-09 DIAGNOSIS — T8469XD Infection and inflammatory reaction due to internal fixation device of other site, subsequent encounter: Secondary | ICD-10-CM | POA: Diagnosis not present

## 2022-10-09 DIAGNOSIS — M869 Osteomyelitis, unspecified: Secondary | ICD-10-CM | POA: Diagnosis not present

## 2022-10-09 DIAGNOSIS — F909 Attention-deficit hyperactivity disorder, unspecified type: Secondary | ICD-10-CM | POA: Diagnosis not present

## 2022-10-09 DIAGNOSIS — Z452 Encounter for adjustment and management of vascular access device: Secondary | ICD-10-CM | POA: Diagnosis not present

## 2022-10-10 ENCOUNTER — Other Ambulatory Visit (HOSPITAL_COMMUNITY): Payer: Self-pay

## 2022-10-12 ENCOUNTER — Other Ambulatory Visit (HOSPITAL_COMMUNITY): Payer: Self-pay

## 2022-10-12 DIAGNOSIS — T8469XD Infection and inflammatory reaction due to internal fixation device of other site, subsequent encounter: Secondary | ICD-10-CM | POA: Diagnosis not present

## 2022-10-12 DIAGNOSIS — F909 Attention-deficit hyperactivity disorder, unspecified type: Secondary | ICD-10-CM | POA: Diagnosis not present

## 2022-10-12 DIAGNOSIS — T8131XD Disruption of external operation (surgical) wound, not elsewhere classified, subsequent encounter: Secondary | ICD-10-CM | POA: Diagnosis not present

## 2022-10-12 DIAGNOSIS — Z452 Encounter for adjustment and management of vascular access device: Secondary | ICD-10-CM | POA: Diagnosis not present

## 2022-10-12 DIAGNOSIS — M810 Age-related osteoporosis without current pathological fracture: Secondary | ICD-10-CM | POA: Diagnosis not present

## 2022-10-12 DIAGNOSIS — M869 Osteomyelitis, unspecified: Secondary | ICD-10-CM | POA: Diagnosis not present

## 2022-10-12 DIAGNOSIS — G894 Chronic pain syndrome: Secondary | ICD-10-CM | POA: Diagnosis not present

## 2022-10-12 DIAGNOSIS — M1990 Unspecified osteoarthritis, unspecified site: Secondary | ICD-10-CM | POA: Diagnosis not present

## 2022-10-12 DIAGNOSIS — F1911 Other psychoactive substance abuse, in remission: Secondary | ICD-10-CM | POA: Diagnosis not present

## 2022-10-12 DIAGNOSIS — F32A Depression, unspecified: Secondary | ICD-10-CM | POA: Diagnosis not present

## 2022-10-14 ENCOUNTER — Encounter: Payer: Self-pay | Admitting: Surgical

## 2022-10-14 ENCOUNTER — Other Ambulatory Visit (HOSPITAL_COMMUNITY): Payer: Self-pay

## 2022-10-14 ENCOUNTER — Ambulatory Visit (INDEPENDENT_AMBULATORY_CARE_PROVIDER_SITE_OTHER): Payer: Commercial Managed Care - PPO | Admitting: Surgical

## 2022-10-14 ENCOUNTER — Other Ambulatory Visit: Payer: Self-pay

## 2022-10-14 VITALS — BP 119/86 | HR 85 | Ht 61.5 in | Wt 107.0 lb

## 2022-10-14 DIAGNOSIS — S31000D Unspecified open wound of lower back and pelvis without penetration into retroperitoneum, subsequent encounter: Secondary | ICD-10-CM

## 2022-10-14 DIAGNOSIS — M25571 Pain in right ankle and joints of right foot: Secondary | ICD-10-CM | POA: Diagnosis not present

## 2022-10-14 MED ORDER — SULFAMETHOXAZOLE-TRIMETHOPRIM 800-160 MG PO TABS: 1.0000 | ORAL_TABLET | Freq: Two times a day (BID) | ORAL | 0 refills | Status: AC

## 2022-10-14 MED ORDER — INFLUENZA VIRUS VACC SPLIT PF (FLUZONE) 0.5 ML IM SUSY
0.5000 mL | PREFILLED_SYRINGE | Freq: Once | INTRAMUSCULAR | 0 refills | Status: AC
  Filled 2022-10-14: qty 0.5, 1d supply, fill #0

## 2022-10-14 MED ORDER — COVID-19 MRNA VAC-TRIS(PFIZER) 30 MCG/0.3ML IM SUSY
0.3000 mL | PREFILLED_SYRINGE | Freq: Once | INTRAMUSCULAR | 0 refills | Status: AC
  Filled 2022-10-14: qty 0.3, 1d supply, fill #0

## 2022-10-14 NOTE — Progress Notes (Signed)
   Referring Provider Beverly Salisbury, MD [address] [place],  [place] [address]   CC:  Chief Complaint  Patient presents with   Follow-up      [name] is an 67 y.o. female.  HPI: 67 year old female here for follow-up on her left lower back wound.  We have been doing collagen, Adaptic, K-Y jelly, Mepilex border dressing changes.  She has been receiving assistance from home health RN with dressing changes Fridays and Mondays and has been seeing her clinic on Wednesdays for continued follow-up.  She reports that she is not having any infectious symptoms.  She reports that the home health RN did not have the correct dressings for her on Monday and therefore she placed a small Mepilex border dressing over the wound.  The RN also reported she was concerned about infection given the drainage and the redness.  Patient reports that she does have some increased pain, particularly her left hip region, but otherwise is doing okay.  She particularly denies any fevers, chills, any other symptomatic changes.  She feels as if the wound is healing well.  Review of Systems General: No fevers or chills  Physical Exam    10/14/2022    2:48 PM 10/07/2022    2:49 PM 09/30/2022    3:09 PM  Vitals with BMI  Height 5' 1.5"    Weight 107 lbs    BMI 19.89    Systolic 119 148 621  Diastolic 86 77 76  Pulse 85  100    General:  No acute distress,  Alert and oriented, Non-Toxic, Normal speech and affect On exam of the left back wound is 5 x 4 x 0.5 cm.  There is no surrounding cellulitic changes.  She does have some mild surrounding erythema.  She does have some mild tenderness with palpation just lateral to the wound.  I do not appreciate any subcutaneous fluid collection, there is no crepitus with palpation.  She does have some yellowish exudate noted on the dressings.  There is no active drainage at this time.  She continues to have rolled border on the inferior medial aspect of the  wound.  Assessment/Plan Patient is a very pleasant 67 year old female here for follow-up on her left lower back wound.  She continues to do well and the wound has improved from 6 x 5 to 5 x 4 today after last week's follow-up.  I do not see any overt signs of infection, however given some surrounding erythema and some increased pain surrounding the periwound area, will prophylactically treat with Bactrim.  I discussed with the patient strict precautions for evaluation in the emergency room.  We discussed that if she continues to have pain, notices any worsening erythema, cellulitic changes, fevers or chills we will need to likely obtain a CT scan to rule out any fluid collections.  I discussed with her that I do not particularly feel any collections today on exam.  It is reassuring that she overall feels well despite some increased tenderness.  Recommend following up in 1 week for reevaluation.  Pictures were taken and placed in the patient's chart with patient's permission.  [name] 10/14/2022, 3:09 PM

## 2022-10-15 ENCOUNTER — Telehealth: Payer: Self-pay | Admitting: *Deleted

## 2022-10-15 NOTE — Telephone Encounter (Signed)
Received on (10/05/2022) via of fax Resumption of Care from [facility].  Requesting provider signature,date, and return.  Given to provider to sign and date.  Resumption of Care signed,dated, and faxed back to [facility].  Confirmation received and copy scanned into the chart.//AB/CMA

## 2022-10-16 DIAGNOSIS — M1990 Unspecified osteoarthritis, unspecified site: Secondary | ICD-10-CM | POA: Diagnosis not present

## 2022-10-16 DIAGNOSIS — G894 Chronic pain syndrome: Secondary | ICD-10-CM | POA: Diagnosis not present

## 2022-10-16 DIAGNOSIS — T8469XD Infection and inflammatory reaction due to internal fixation device of other site, subsequent encounter: Secondary | ICD-10-CM | POA: Diagnosis not present

## 2022-10-16 DIAGNOSIS — F909 Attention-deficit hyperactivity disorder, unspecified type: Secondary | ICD-10-CM | POA: Diagnosis not present

## 2022-10-16 DIAGNOSIS — F32A Depression, unspecified: Secondary | ICD-10-CM | POA: Diagnosis not present

## 2022-10-16 DIAGNOSIS — T8131XD Disruption of external operation (surgical) wound, not elsewhere classified, subsequent encounter: Secondary | ICD-10-CM | POA: Diagnosis not present

## 2022-10-16 DIAGNOSIS — M869 Osteomyelitis, unspecified: Secondary | ICD-10-CM | POA: Diagnosis not present

## 2022-10-16 DIAGNOSIS — M810 Age-related osteoporosis without current pathological fracture: Secondary | ICD-10-CM | POA: Diagnosis not present

## 2022-10-16 DIAGNOSIS — Z452 Encounter for adjustment and management of vascular access device: Secondary | ICD-10-CM | POA: Diagnosis not present

## 2022-10-16 DIAGNOSIS — F1911 Other psychoactive substance abuse, in remission: Secondary | ICD-10-CM | POA: Diagnosis not present

## 2022-10-18 ENCOUNTER — Other Ambulatory Visit (HOSPITAL_COMMUNITY): Payer: Self-pay

## 2022-10-18 DIAGNOSIS — F4320 Adjustment disorder, unspecified: Secondary | ICD-10-CM | POA: Diagnosis not present

## 2022-10-19 ENCOUNTER — Other Ambulatory Visit: Payer: Self-pay

## 2022-10-19 DIAGNOSIS — G894 Chronic pain syndrome: Secondary | ICD-10-CM | POA: Diagnosis not present

## 2022-10-19 DIAGNOSIS — F1911 Other psychoactive substance abuse, in remission: Secondary | ICD-10-CM | POA: Diagnosis not present

## 2022-10-19 DIAGNOSIS — M869 Osteomyelitis, unspecified: Secondary | ICD-10-CM | POA: Diagnosis not present

## 2022-10-19 DIAGNOSIS — M810 Age-related osteoporosis without current pathological fracture: Secondary | ICD-10-CM | POA: Diagnosis not present

## 2022-10-19 DIAGNOSIS — M1990 Unspecified osteoarthritis, unspecified site: Secondary | ICD-10-CM | POA: Diagnosis not present

## 2022-10-19 DIAGNOSIS — F909 Attention-deficit hyperactivity disorder, unspecified type: Secondary | ICD-10-CM | POA: Diagnosis not present

## 2022-10-19 DIAGNOSIS — Z452 Encounter for adjustment and management of vascular access device: Secondary | ICD-10-CM | POA: Diagnosis not present

## 2022-10-19 DIAGNOSIS — T8469XD Infection and inflammatory reaction due to internal fixation device of other site, subsequent encounter: Secondary | ICD-10-CM | POA: Diagnosis not present

## 2022-10-19 DIAGNOSIS — T8131XD Disruption of external operation (surgical) wound, not elsewhere classified, subsequent encounter: Secondary | ICD-10-CM | POA: Diagnosis not present

## 2022-10-19 DIAGNOSIS — F32A Depression, unspecified: Secondary | ICD-10-CM | POA: Diagnosis not present

## 2022-10-21 ENCOUNTER — Encounter: Payer: Self-pay | Admitting: Surgical

## 2022-10-21 ENCOUNTER — Telehealth: Payer: Self-pay | Admitting: *Deleted

## 2022-10-21 ENCOUNTER — Ambulatory Visit (INDEPENDENT_AMBULATORY_CARE_PROVIDER_SITE_OTHER): Payer: Commercial Managed Care - PPO | Admitting: Surgical

## 2022-10-21 VITALS — BP 144/72 | HR 79 | Ht 61.5 in | Wt 106.0 lb

## 2022-10-21 DIAGNOSIS — S31000D Unspecified open wound of lower back and pelvis without penetration into retroperitoneum, subsequent encounter: Secondary | ICD-10-CM | POA: Diagnosis not present

## 2022-10-21 MED ORDER — SULFAMETHOXAZOLE-TRIMETHOPRIM 800-160 MG PO TABS: 1.0000 | ORAL_TABLET | Freq: Two times a day (BID) | ORAL | 0 refills | Status: AC

## 2022-10-21 NOTE — Telephone Encounter (Signed)
Received on (10/21/2022) via fax Home Health Certification and Plan of Care from [facility].  Requesting sign,date,and return.  Given to provider to complete.    Home Health Certification and Plan of Care signed,dated,and faxed back to [facility].  Confirmation received and copy scanned into the chart.//AB/CMA
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
## 2022-10-23 ENCOUNTER — Telehealth: Payer: Self-pay | Admitting: Plastic Surgery

## 2022-10-23 ENCOUNTER — Telehealth: Payer: Self-pay | Admitting: *Deleted

## 2022-10-23 NOTE — Telephone Encounter (Signed)
[employer] called and lvmail asking about pt continued care with vac. [employer] needs a confirmation order for the wound vac to continue services that expire today and needs to be done by Monday 10-26-22.

## 2022-10-23 NOTE — Telephone Encounter (Signed)
Received on (10/21/2022) via of fax Physician Orders from [facility].  Requesting signature,date,and return.  Given to provider sign,date,and return.  Physician Orders signed,dated,and faxed back to [facility].  Confirmation received and copy scanned into the chart.//AB/CMA

## 2022-10-23 NOTE — Telephone Encounter (Signed)
Received on (10/21/2022) via of fax Home Health Certification and Plan of Care from [facility].  Requesting signature,date,and return.  Given to provider to sign and date.  Home Health Certification and Plan of Care signed,dated,and faxed back to [facility].  Confirmation received and copy scanned into the chart.//AB/CMA

## 2022-10-26 ENCOUNTER — Other Ambulatory Visit: Payer: Self-pay | Admitting: *Deleted

## 2022-10-26 NOTE — Telephone Encounter (Signed)
Please notify [facility] patient has been d/c'ed from [facility]. She was informed to send it back at her appt 2 weeks ago. She was on a vac holiday

## 2022-10-27 DIAGNOSIS — M810 Age-related osteoporosis without current pathological fracture: Secondary | ICD-10-CM | POA: Diagnosis not present

## 2022-10-27 DIAGNOSIS — Z452 Encounter for adjustment and management of vascular access device: Secondary | ICD-10-CM | POA: Diagnosis not present

## 2022-10-27 DIAGNOSIS — T8469XD Infection and inflammatory reaction due to internal fixation device of other site, subsequent encounter: Secondary | ICD-10-CM | POA: Diagnosis not present

## 2022-10-27 DIAGNOSIS — F32A Depression, unspecified: Secondary | ICD-10-CM | POA: Diagnosis not present

## 2022-10-27 DIAGNOSIS — G894 Chronic pain syndrome: Secondary | ICD-10-CM | POA: Diagnosis not present

## 2022-10-27 DIAGNOSIS — M869 Osteomyelitis, unspecified: Secondary | ICD-10-CM | POA: Diagnosis not present

## 2022-10-27 DIAGNOSIS — M1990 Unspecified osteoarthritis, unspecified site: Secondary | ICD-10-CM | POA: Diagnosis not present

## 2022-10-27 DIAGNOSIS — F4320 Adjustment disorder, unspecified: Secondary | ICD-10-CM | POA: Diagnosis not present

## 2022-10-27 DIAGNOSIS — F909 Attention-deficit hyperactivity disorder, unspecified type: Secondary | ICD-10-CM | POA: Diagnosis not present

## 2022-10-27 DIAGNOSIS — T8131XD Disruption of external operation (surgical) wound, not elsewhere classified, subsequent encounter: Secondary | ICD-10-CM | POA: Diagnosis not present

## 2022-10-27 DIAGNOSIS — F1911 Other psychoactive substance abuse, in remission: Secondary | ICD-10-CM | POA: Diagnosis not present

## 2022-10-28 ENCOUNTER — Encounter: Payer: Commercial Managed Care - PPO | Admitting: Surgical

## 2022-10-28 ENCOUNTER — Telehealth: Payer: Self-pay | Admitting: Vascular Surgery

## 2022-10-28 DIAGNOSIS — M25571 Pain in right ankle and joints of right foot: Secondary | ICD-10-CM | POA: Diagnosis not present

## 2022-10-29 ENCOUNTER — Encounter: Payer: Self-pay | Admitting: Surgical

## 2022-10-29 ENCOUNTER — Ambulatory Visit: Payer: Commercial Managed Care - PPO | Admitting: Surgical

## 2022-10-29 VITALS — BP 117/72 | HR 74 | Ht 61.5 in | Wt 107.0 lb

## 2022-10-29 DIAGNOSIS — S31000D Unspecified open wound of lower back and pelvis without penetration into retroperitoneum, subsequent encounter: Secondary | ICD-10-CM

## 2022-10-29 NOTE — Telephone Encounter (Signed)
Pt appt scheduled
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
## 2022-10-30 ENCOUNTER — Telehealth: Payer: Self-pay | Admitting: *Deleted

## 2022-10-30 DIAGNOSIS — F32A Depression, unspecified: Secondary | ICD-10-CM | POA: Diagnosis not present

## 2022-10-30 DIAGNOSIS — M810 Age-related osteoporosis without current pathological fracture: Secondary | ICD-10-CM | POA: Diagnosis not present

## 2022-10-30 DIAGNOSIS — T8469XD Infection and inflammatory reaction due to internal fixation device of other site, subsequent encounter: Secondary | ICD-10-CM | POA: Diagnosis not present

## 2022-10-30 DIAGNOSIS — Z452 Encounter for adjustment and management of vascular access device: Secondary | ICD-10-CM | POA: Diagnosis not present

## 2022-10-30 DIAGNOSIS — M869 Osteomyelitis, unspecified: Secondary | ICD-10-CM | POA: Diagnosis not present

## 2022-10-30 DIAGNOSIS — F909 Attention-deficit hyperactivity disorder, unspecified type: Secondary | ICD-10-CM | POA: Diagnosis not present

## 2022-10-30 DIAGNOSIS — G894 Chronic pain syndrome: Secondary | ICD-10-CM | POA: Diagnosis not present

## 2022-10-30 DIAGNOSIS — T8131XD Disruption of external operation (surgical) wound, not elsewhere classified, subsequent encounter: Secondary | ICD-10-CM | POA: Diagnosis not present

## 2022-10-30 DIAGNOSIS — M1990 Unspecified osteoarthritis, unspecified site: Secondary | ICD-10-CM | POA: Diagnosis not present

## 2022-10-30 DIAGNOSIS — F1911 Other psychoactive substance abuse, in remission: Secondary | ICD-10-CM | POA: Diagnosis not present

## 2022-10-30 NOTE — Telephone Encounter (Signed)
Received on (10/29/2022) via of fax Physician Orders from [facility].  Requesting signature,date, and return.  Given to provider to sign and date.  Physician Order signed,dated,and faxed back to [facility].  Confirmation received and copy scanned into the chart.//AB/CMA

## 2022-10-31 ENCOUNTER — Other Ambulatory Visit: Payer: Self-pay | Admitting: Family Medicine

## 2022-10-31 ENCOUNTER — Other Ambulatory Visit (HOSPITAL_COMMUNITY): Payer: Self-pay

## 2022-11-02 ENCOUNTER — Telehealth: Payer: Self-pay | Admitting: *Deleted

## 2022-11-02 ENCOUNTER — Other Ambulatory Visit: Payer: Self-pay | Admitting: *Deleted

## 2022-11-02 DIAGNOSIS — T8469XD Infection and inflammatory reaction due to internal fixation device of other site, subsequent encounter: Secondary | ICD-10-CM | POA: Diagnosis not present

## 2022-11-02 DIAGNOSIS — L03115 Cellulitis of right lower limb: Secondary | ICD-10-CM

## 2022-11-02 DIAGNOSIS — T8131XD Disruption of external operation (surgical) wound, not elsewhere classified, subsequent encounter: Secondary | ICD-10-CM | POA: Diagnosis not present

## 2022-11-02 DIAGNOSIS — M810 Age-related osteoporosis without current pathological fracture: Secondary | ICD-10-CM | POA: Diagnosis not present

## 2022-11-02 DIAGNOSIS — F1911 Other psychoactive substance abuse, in remission: Secondary | ICD-10-CM | POA: Diagnosis not present

## 2022-11-02 DIAGNOSIS — M1990 Unspecified osteoarthritis, unspecified site: Secondary | ICD-10-CM | POA: Diagnosis not present

## 2022-11-02 DIAGNOSIS — Z452 Encounter for adjustment and management of vascular access device: Secondary | ICD-10-CM | POA: Diagnosis not present

## 2022-11-02 DIAGNOSIS — M869 Osteomyelitis, unspecified: Secondary | ICD-10-CM | POA: Diagnosis not present

## 2022-11-02 DIAGNOSIS — F909 Attention-deficit hyperactivity disorder, unspecified type: Secondary | ICD-10-CM | POA: Diagnosis not present

## 2022-11-02 DIAGNOSIS — G894 Chronic pain syndrome: Secondary | ICD-10-CM | POA: Diagnosis not present

## 2022-11-02 DIAGNOSIS — F32A Depression, unspecified: Secondary | ICD-10-CM | POA: Diagnosis not present

## 2022-11-02 NOTE — Telephone Encounter (Signed)
Received on (10/15/22) via of fax Physician's Prescription [facility] from [facility] requesting signature,date,and complete.    Physician Prescription completed,signed,dated,and faxed back to [facility]. Confirmation received and copy scanned into the chart.//AB/CMA

## 2022-11-02 NOTE — Telephone Encounter (Signed)
Received on (10/31/22) via of fax Request for Discharge Orders from [facility].  Requesting signature,date,and return.  Given to provider to sign,date,and return.  Discharge Orders completed and faxed back to [facility]  Confirmation received and copy scanned into the chart.//AB/CMA

## 2022-11-03 ENCOUNTER — Ambulatory Visit (HOSPITAL_COMMUNITY)
Admission: RE | Admit: 2022-11-03 | Discharge: 2022-11-03 | Disposition: A | Discharge status: Home / Self Care | Payer: Commercial Managed Care - PPO | Source: Ambulatory Visit | Attending: Vascular Surgery | Admitting: Vascular Surgery

## 2022-11-03 ENCOUNTER — Other Ambulatory Visit (HOSPITAL_COMMUNITY): Payer: Self-pay

## 2022-11-03 DIAGNOSIS — L03115 Cellulitis of right lower limb: Secondary | ICD-10-CM | POA: Insufficient documentation

## 2022-11-03 LAB — VAS US ABI WITH/WO TBI
Left ABI: 1.2
Right ABI: 1.06

## 2022-11-04 ENCOUNTER — Other Ambulatory Visit (HOSPITAL_COMMUNITY): Payer: Self-pay

## 2022-11-04 DIAGNOSIS — F32A Depression, unspecified: Secondary | ICD-10-CM | POA: Diagnosis not present

## 2022-11-04 DIAGNOSIS — G894 Chronic pain syndrome: Secondary | ICD-10-CM | POA: Diagnosis not present

## 2022-11-04 DIAGNOSIS — T8131XD Disruption of external operation (surgical) wound, not elsewhere classified, subsequent encounter: Secondary | ICD-10-CM | POA: Diagnosis not present

## 2022-11-04 DIAGNOSIS — T8469XD Infection and inflammatory reaction due to internal fixation device of other site, subsequent encounter: Secondary | ICD-10-CM | POA: Diagnosis not present

## 2022-11-04 DIAGNOSIS — Z452 Encounter for adjustment and management of vascular access device: Secondary | ICD-10-CM | POA: Diagnosis not present

## 2022-11-04 DIAGNOSIS — F909 Attention-deficit hyperactivity disorder, unspecified type: Secondary | ICD-10-CM | POA: Diagnosis not present

## 2022-11-04 DIAGNOSIS — M810 Age-related osteoporosis without current pathological fracture: Secondary | ICD-10-CM | POA: Diagnosis not present

## 2022-11-04 DIAGNOSIS — M1990 Unspecified osteoarthritis, unspecified site: Secondary | ICD-10-CM | POA: Diagnosis not present

## 2022-11-04 DIAGNOSIS — M869 Osteomyelitis, unspecified: Secondary | ICD-10-CM | POA: Diagnosis not present

## 2022-11-04 DIAGNOSIS — F1911 Other psychoactive substance abuse, in remission: Secondary | ICD-10-CM | POA: Diagnosis not present

## 2022-11-05 ENCOUNTER — Other Ambulatory Visit: Payer: Self-pay

## 2022-11-05 MED ORDER — CELECOXIB 200 MG PO CAPS
200.0000 mg | ORAL_CAPSULE | Freq: Two times a day (BID) | ORAL | 3 refills | Status: AC
  Filled 2022-11-05 – 2023-02-26 (×3): qty 180, 90d supply, fill #0

## 2022-11-06 ENCOUNTER — Other Ambulatory Visit (HOSPITAL_COMMUNITY): Payer: Self-pay

## 2022-11-06 DIAGNOSIS — Z452 Encounter for adjustment and management of vascular access device: Secondary | ICD-10-CM | POA: Diagnosis not present

## 2022-11-06 DIAGNOSIS — F1911 Other psychoactive substance abuse, in remission: Secondary | ICD-10-CM | POA: Diagnosis not present

## 2022-11-06 DIAGNOSIS — T8131XD Disruption of external operation (surgical) wound, not elsewhere classified, subsequent encounter: Secondary | ICD-10-CM | POA: Diagnosis not present

## 2022-11-06 DIAGNOSIS — M869 Osteomyelitis, unspecified: Secondary | ICD-10-CM | POA: Diagnosis not present

## 2022-11-06 DIAGNOSIS — G894 Chronic pain syndrome: Secondary | ICD-10-CM | POA: Diagnosis not present

## 2022-11-06 DIAGNOSIS — M1990 Unspecified osteoarthritis, unspecified site: Secondary | ICD-10-CM | POA: Diagnosis not present

## 2022-11-06 DIAGNOSIS — S82891A Other fracture of right lower leg, initial encounter for closed fracture: Secondary | ICD-10-CM | POA: Diagnosis not present

## 2022-11-06 DIAGNOSIS — M810 Age-related osteoporosis without current pathological fracture: Secondary | ICD-10-CM | POA: Diagnosis not present

## 2022-11-06 DIAGNOSIS — F909 Attention-deficit hyperactivity disorder, unspecified type: Secondary | ICD-10-CM | POA: Diagnosis not present

## 2022-11-06 DIAGNOSIS — F32A Depression, unspecified: Secondary | ICD-10-CM | POA: Diagnosis not present

## 2022-11-06 DIAGNOSIS — T8469XD Infection and inflammatory reaction due to internal fixation device of other site, subsequent encounter: Secondary | ICD-10-CM | POA: Diagnosis not present

## 2022-11-09 DIAGNOSIS — M869 Osteomyelitis, unspecified: Secondary | ICD-10-CM | POA: Diagnosis not present

## 2022-11-09 DIAGNOSIS — F909 Attention-deficit hyperactivity disorder, unspecified type: Secondary | ICD-10-CM | POA: Diagnosis not present

## 2022-11-09 DIAGNOSIS — G894 Chronic pain syndrome: Secondary | ICD-10-CM | POA: Diagnosis not present

## 2022-11-09 DIAGNOSIS — F1911 Other psychoactive substance abuse, in remission: Secondary | ICD-10-CM | POA: Diagnosis not present

## 2022-11-09 DIAGNOSIS — Z452 Encounter for adjustment and management of vascular access device: Secondary | ICD-10-CM | POA: Diagnosis not present

## 2022-11-09 DIAGNOSIS — M1990 Unspecified osteoarthritis, unspecified site: Secondary | ICD-10-CM | POA: Diagnosis not present

## 2022-11-09 DIAGNOSIS — M25571 Pain in right ankle and joints of right foot: Secondary | ICD-10-CM | POA: Diagnosis not present

## 2022-11-09 DIAGNOSIS — F32A Depression, unspecified: Secondary | ICD-10-CM | POA: Diagnosis not present

## 2022-11-09 DIAGNOSIS — T8131XD Disruption of external operation (surgical) wound, not elsewhere classified, subsequent encounter: Secondary | ICD-10-CM | POA: Diagnosis not present

## 2022-11-09 DIAGNOSIS — T8469XD Infection and inflammatory reaction due to internal fixation device of other site, subsequent encounter: Secondary | ICD-10-CM | POA: Diagnosis not present

## 2022-11-09 DIAGNOSIS — M810 Age-related osteoporosis without current pathological fracture: Secondary | ICD-10-CM | POA: Diagnosis not present

## 2022-11-11 ENCOUNTER — Ambulatory Visit: Payer: Commercial Managed Care - PPO | Admitting: Surgical

## 2022-11-11 DIAGNOSIS — S31000D Unspecified open wound of lower back and pelvis without penetration into retroperitoneum, subsequent encounter: Secondary | ICD-10-CM
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
## 2022-11-12 ENCOUNTER — Other Ambulatory Visit (HOSPITAL_COMMUNITY): Payer: Self-pay

## 2022-11-12 ENCOUNTER — Other Ambulatory Visit: Payer: Self-pay

## 2022-11-13 ENCOUNTER — Other Ambulatory Visit: Payer: Self-pay

## 2022-11-13 ENCOUNTER — Other Ambulatory Visit (HOSPITAL_COMMUNITY): Payer: Self-pay

## 2022-11-13 DIAGNOSIS — Z452 Encounter for adjustment and management of vascular access device: Secondary | ICD-10-CM | POA: Diagnosis not present

## 2022-11-13 DIAGNOSIS — G894 Chronic pain syndrome: Secondary | ICD-10-CM | POA: Diagnosis not present

## 2022-11-13 DIAGNOSIS — F909 Attention-deficit hyperactivity disorder, unspecified type: Secondary | ICD-10-CM | POA: Diagnosis not present

## 2022-11-13 DIAGNOSIS — F1911 Other psychoactive substance abuse, in remission: Secondary | ICD-10-CM | POA: Diagnosis not present

## 2022-11-13 DIAGNOSIS — T8131XD Disruption of external operation (surgical) wound, not elsewhere classified, subsequent encounter: Secondary | ICD-10-CM | POA: Diagnosis not present

## 2022-11-13 DIAGNOSIS — M1990 Unspecified osteoarthritis, unspecified site: Secondary | ICD-10-CM | POA: Diagnosis not present

## 2022-11-13 DIAGNOSIS — F32A Depression, unspecified: Secondary | ICD-10-CM | POA: Diagnosis not present

## 2022-11-13 DIAGNOSIS — M810 Age-related osteoporosis without current pathological fracture: Secondary | ICD-10-CM | POA: Diagnosis not present

## 2022-11-13 DIAGNOSIS — T8469XD Infection and inflammatory reaction due to internal fixation device of other site, subsequent encounter: Secondary | ICD-10-CM | POA: Diagnosis not present

## 2022-11-13 DIAGNOSIS — M869 Osteomyelitis, unspecified: Secondary | ICD-10-CM | POA: Diagnosis not present

## 2022-11-16 NOTE — Progress Notes (Unsigned)
Patient name: [name] MRN: [id number] DOB: 1955-06-28 Sex: female  REASON FOR CONSULT: Evaluate for PAD given nonhealing surgical wound to right lower extremity  HPI: [name] is a 67 y.o. female, with history of hypertension that presents for evaluation of PAD in her right lower extremity due to nonhealing surgical wound.  Previously had a right pilon ankle fracture with both lateral and medial malleolus fracture.  She is undergone surgical intervention with orthopedic surgery by Dr. Susa Simmonds.  She subsequently had dehiscence requiring skin substitute on 04/16/2022.  She is then required multiple additional interventions with plastic surgery and orthopedics for wound dehiscence and nonhealing including 06/07/2022 and 07/22/2022 with plastic surgery.  Past Medical History:  Diagnosis Date   ADHD (attention deficit hyperactivity disorder)    Anemia    Anxiety    ARDS (adult respiratory distress syndrome) (HCC) 2007   Arthritis    Depression    Gallstones 01/07/2018   Ganglion cyst of dorsum of right wrist    Headache    Hepatitis    2004 non detectable now   Hypertension    MRSA (methicillin resistant Staphylococcus aureus) 2007   Osteoporosis    Pneumonia 2009   Prosthetic eye globe    right eye    Raynaud's disease     Past Surgical History:  Procedure Laterality Date   ABDOMINAL HYSTERECTOMY     ANTERIOR CERVICAL DECOMP/DISCECTOMY FUSION N/A 12/12/2014   Procedure: ANTERIOR CERVICAL DECOMPRESSION/DISCECTOMY FUSION 3 LEVELS;  Surgeon: Estill Bamberg, MD;  Location: [facility] OR;  Service: Orthopedics;  Laterality: N/A;  Anterior cervical decompression fusion, cerivcal 5-6, cervical 6-7, cervical 7-thoracic 1 with instrumentation and allografrt   APLIGRAFT PLACEMENT Right 04/16/2022   Procedure: APPLICATION OF SKIN SUBSTITUTE GRAFT;  Surgeon: Terance Hart, MD;  Location: [facility] OR;  Service: Orthopedics;  Laterality: Right;   APPENDECTOMY  1974   APPLICATION  OF WOUND VAC Left 07/22/2022   Procedure: APPLICATION OF WOUND VAC AND MYRIAD;  Surgeon: Peggye Form, DO;  Location: [facility] OR;  Service: Plastics;  Laterality: Left;   CATARACT EXTRACTION Left 11/2018   CHOLECYSTECTOMY N/A 01/07/2018   Procedure: LAPAROSCOPIC CHOLECYSTECTOMY WITH INTRAOPERATIVE CHOLANGIOGRAM;  Surgeon: Claud Kelp, MD;  Location[facility] OR;  Service: General;  Laterality: N/A;   COLONOSCOPY  04/14/2013   per Dr. Christella Hartigan, clear, repeat in 10 yrs    CORNEAL TRANSPLANT Right 2007   x4   ENUCLEATION Right 2013   eye infection   GANGLION CYST EXCISION Right 05/25/2016   Procedure: RIGHT WRIST ULNAR GANGLION EXCISION;  Surgeon: Mack Hook, MD;  Location: [facility];  Service: Orthopedics;  Laterality: Right;   HARDWARE REMOVAL Right 08/20/2022   Procedure: HARDWARE REMOVAL;  Surgeon: Terance Hart, MD;  Location: [facility] OR;  Service: Orthopedics;  Laterality: Right;   HEMATOMA EVACUATION Left 07/22/2022   Procedure: EVACUATION HEMATOMA LEFT BACK WITH EXCISION OF WOUND;  Surgeon: Peggye Form, DO;  Location: [facility] OR;  Service: Plastics;  Laterality: Left;   I & D EXTREMITY Right 05/28/2022   Procedure: right ankle wound debridement with myriad placement;  Surgeon: Peggye Form, DO;  Location: [facility];  Service: Plastics;  Laterality: Right;   I & D EXTREMITY Right 08/20/2022   Procedure: IRRIGATION AND DEBRIDEMENT EXTREMITY WITH WOUND VAC PLACEMENT;  Surgeon: Terance Hart, MD;  Location: [facility] OR;  Service: Orthopedics;  Laterality: Right;   INCISION AND DRAINAGE Right 07/22/2022   Procedure: INCISION AND  DRAINAGE RIGHT ANKLE;  Surgeon: Peggye Form, DO;  Location: [facility] OR;  Service: Plastics;  Laterality: Right;   MINOR HARDWARE REMOVAL Right 04/16/2022   Procedure: REPAIR OF SURGICAL WOUND DEHISCENCE RIGHT ANKLE, REMOVAL OF DEEP ORTHOPEDIC HARDWARE;  Surgeon: Terance Hart, MD;  Location: [facility] OR;  Service:  Orthopedics;  Laterality: Right;   ORIF CLAVICULAR FRACTURE Left 02/03/2022   Procedure: OPEN REDUCTION INTERNAL FIXATION (ORIF) LEFT CLAVICULAR FRACTURE WITH CORACOLAVICULAR FIXATION;  Surgeon: Jones Broom, MD;  Location: [facility] OR;  Service: Orthopedics;  Laterality: Left;  BIOMET ZIP LOOP WITH CLAVICLE PLATES   ORIF FIBULA FRACTURE Right 02/03/2022   Procedure: OPEN TREATMENT OF RIGHT PILON ANKLE FRACTURE INCLUDING LATERAL MALLEOLUS FRACTURE;  Surgeon: Terance Hart, MD;  Location: [facility] OR;  Service: Orthopedics;  Laterality: Right;  LENGTH OF SURGERY: 120 MINUTES   REVERSE SHOULDER ARTHROPLASTY Right 07/10/2021   Procedure: REVERSE SHOULDER ARTHROPLASTY;  Surgeon: Jones Broom, MD;  Location: [facility];  Service: Orthopedics;  Laterality: Right;   RHINOPLASTY  1980   ROBOTIC ASSISTED LAPAROSCOPIC SACROCOLPOPEXY Bilateral 06/13/2020   Procedure: XI ROBOTIC ASSISTED LAPAROSCOPIC SACROCOLPOPEXY AND SUPRACERVICAL HYSTERECTOMY AND BILATERAL  SALPINGO OOPHERECTOMY;  Surgeon: Crist Fat, MD;  Location: [facility];  Service: Urology;  Laterality: Bilateral;   SYNDESMOSIS REPAIR Right 02/03/2022   Procedure: POSSIBLE OPEN TREATMENT RIGHT SYNDESMOSIS;  Surgeon: Terance Hart, MD;  Location: [facility] OR;  Service: Orthopedics;  Laterality: Right;   TONSILLECTOMY     removed in 3rd grade   TOTAL SHOULDER ARTHROPLASTY Left 03/13/2019   Procedure: TOTAL SHOULDER ARTHROPLASTY;  Surgeon: Jones Broom, MD;  Location: [facility];  Service: Orthopedics;  Laterality: Left;    Family History  Problem Relation Age of Onset   Breast cancer Maternal Aunt 60   Colon cancer Neg Hx     SOCIAL HISTORY: Social History   Socioeconomic History   Marital status: Single    Spouse name: Not on file   Number of children: Not on file   Years of education: Not on file   Highest education level: Bachelor's degree (e.g., BA, AB, BS)  Occupational History   Not on file  Tobacco Use   Smoking  status: Never   Smokeless tobacco: Never  Vaping Use   Vaping status: Never Used  Substance and Sexual Activity   Alcohol use: Not Currently    Comment: occ   Drug use: No   Sexual activity: Never    Birth control/protection: Post-menopausal  Other Topics Concern   Not on file  Social History Narrative   Not on file   Social Determinants of Health   Financial Resource Strain: Low Risk  (06/08/2022)   [facility] from [facility], [facility]   Overall Financial Resource Strain (CARDIA)    Difficulty of Paying Living Expenses: Not very hard  Food Insecurity: Unknown (07/22/2022)   Hunger Vital Sign    Worried About Running Out of Food in the Last Year: Never true    Ran Out of Food in the Last Year: Not on file  Transportation Needs: Unknown (07/22/2022)   PRAPARE - Transportation    Lack of Transportation (Medical): No    Lack of Transportation (Non-Medical): Not on file  Physical Activity: Sufficiently Active (08/14/2021)   Exercise Vital Sign    Days of Exercise per Week: 4 days    Minutes of Exercise per Session: 90 min  Stress: No Stress Concern Present (08/14/2021)   [employer] -  Occupational Stress Questionnaire    Feeling of Stress : Not at all  Social Connections: Unknown (03/25/2022)   Received from [facility], [facility]   Social Network    Social Network: Not on file  Intimate Partner Violence: Not At Risk (06/30/2022)   Received from [facility], [facility]   HITS    Over the last 12 months how often did your partner physically hurt you?: 1    Over the last 12 months how often did your partner insult you or talk down to you?: 1    Over the last 12 months how often did your partner threaten you with physical harm?: 1    Over the last 12 months how often did your partner scream or curse at you?: 1    No Known Allergies  Current Outpatient Medications  Medication Sig Dispense Refill   Acetaminophen Extra Strength 500  MG TABS Take 1,000 mg by mouth as needed (pain).     amphetamine-dextroamphetamine (ADDERALL XR) 20 MG 24 hr capsule Take 1 capsule (20 mg total) by mouth every morning. 30 capsule 0   amphetamine-dextroamphetamine (ADDERALL XR) 20 MG 24 hr capsule Take 1 capsule (20 mg total) by mouth every morning. 30 capsule 0   amphetamine-dextroamphetamine (ADDERALL XR) 20 MG 24 hr capsule Take 1 capsule (20 mg total) by mouth every morning. 30 capsule 0   celecoxib (CELEBREX) 200 MG capsule Take 1 capsule (200 mg total) by mouth 2 (two) times daily. 60 capsule 0   celecoxib (CELEBREX) 200 MG capsule Take 1 capsule (200 mg total) by mouth 2 (two) times daily. 180 capsule 3   cyanocobalamin 1000 MCG tablet Take 1 tablet (1,000 mcg total) by mouth daily. 30 tablet 0   cycloSPORINE (RESTASIS) 0.05 % ophthalmic emulsion Place 1 drop into both eyes 2 times daily. (Patient taking differently: Place 2 drops into both eyes 2 (two) times daily.) 60 each 11   estradiol (ESTRACE VAGINAL) 0.1 MG/GM vaginal cream Apply a fingertip amount to urethra 2 times per week. (Patient taking differently: Place 1 Applicatorful vaginally once a week.) 42.5 g 3   nystatin (MYCOSTATIN) 100000 UNIT/ML suspension Take 5 mLs (500,000 Units total) by mouth 4 (four) times daily. (Patient taking differently: Take 5 mLs by mouth as needed (thrush).) 473 mL 2   ondansetron (ZOFRAN) 4 MG tablet Take 1 tablet (4 mg total) by mouth every 6 (six) hours as needed for nausea. 20 tablet 0   Oxycodone HCl 20 MG TABS Take 1 tablet (20 mg total) by mouth every 6 (six) hours as needed (pain). 120 tablet 0   Oxycodone HCl 20 MG TABS Take 1 tablet (20 mg total) by mouth every 6 (six) hours as needed (pain). 120 tablet 0   Oxycodone HCl 20 MG TABS Take 1 tablet (20 mg total) by mouth every 6 (six) hours as needed (pain). 120 tablet 0   polyethylene glycol powder (GLYCOLAX/MIRALAX) powder TAKE 17 GRAMS BY MOUTH 2 TIMES DAILY AS NEEDED. (Patient taking  differently: Take 17 g by mouth as needed for moderate constipation.) 3162 g 1   pregabalin (LYRICA) 300 MG capsule Take 1 capsule (300 mg total) by mouth 2 (two) times daily. 60 capsule 5   senna-docusate (SENOKOT-S) 8.6-50 MG tablet Take 1 tablet by mouth 2 (two) times daily. 60 tablet 0   valACYclovir (VALTREX) 1000 MG tablet Take 1 tablet (1,000 mg total) by mouth 3 (three) times daily. (Patient taking differently: Take 1,000 mg by mouth daily.)  30 tablet 1   verapamil (CALAN-SR) 120 MG CR tablet Take 1 tablet (120 mg total) by mouth at bedtime. 90 tablet 0   No current facility-administered medications for this visit.    REVIEW OF SYSTEMS:  [X]  denotes positive finding, [ ]  denotes negative finding Cardiac  Comments:  Chest pain or chest pressure: ***   Shortness of breath upon exertion:    Short of breath when lying flat:    Irregular heart rhythm:        Vascular    Pain in calf, thigh, or hip brought on by ambulation:    Pain in feet at night that wakes you up from your sleep:     Blood clot in your veins:    Leg swelling:         Pulmonary    Oxygen at home:    Productive cough:     Wheezing:         Neurologic    Sudden weakness in arms or legs:     Sudden numbness in arms or legs:     Sudden onset of difficulty speaking or slurred speech:    Temporary loss of vision in one eye:     Problems with dizziness:         Gastrointestinal    Blood in stool:     Vomited blood:         Genitourinary    Burning when urinating:     Blood in urine:        Psychiatric    Major depression:         Hematologic    Bleeding problems:    Problems with blood clotting too easily:        Skin    Rashes or ulcers:        Constitutional    Fever or chills:      PHYSICAL EXAM: There were no vitals filed for this visit.  GENERAL: The patient is a well-nourished female, in no acute distress. The vital signs are documented above. CARDIAC: There is a regular rate and  rhythm.  VASCULAR: *** PULMONARY: There is good air exchange bilaterally without wheezing or rales. ABDOMEN: Soft and non-tender with normal pitched bowel sounds.  MUSCULOSKELETAL: There are no major deformities or cyanosis. NEUROLOGIC: No focal weakness or paresthesias are detected. SKIN: There are no ulcers or rashes noted. PSYCHIATRIC: The patient has a normal affect.  DATA:   ABIs on 11/03/2022 are 1.06 on the right triphasic and 1.20 in the left biphasic.  On the right her toe pressures 35 and suboptimal.  Assessment/Plan:  67 y.o. female, with history of hypertension that presents for evaluation of PAD in her right lower extremity due to nonhealing surgical wound.  Previously had a right pilon ankle fracture with both lateral and medial malleolus fracture.  She is undergone surgical intervention with orthopedic surgery by Dr. Susa Simmonds.  She subsequently had dehiscence requiring skin substitute on 04/16/2022.  She is then required multiple additional interventions with plastic surgery and orthopedics for wound dehiscence and nonhealing including 06/07/2022 and 07/22/2022 with plastic surgery.   Cephus Shelling, MD [facility] Office: [phone]

## 2022-11-17 ENCOUNTER — Other Ambulatory Visit: Payer: Self-pay

## 2022-11-17 ENCOUNTER — Ambulatory Visit (INDEPENDENT_AMBULATORY_CARE_PROVIDER_SITE_OTHER): Payer: Commercial Managed Care - PPO | Admitting: Vascular Surgery

## 2022-11-17 ENCOUNTER — Encounter: Payer: Self-pay | Admitting: Vascular Surgery

## 2022-11-17 VITALS — BP 145/79 | HR 70 | Temp 97.6°F | Wt 108.0 lb

## 2022-11-17 DIAGNOSIS — I739 Peripheral vascular disease, unspecified: Secondary | ICD-10-CM | POA: Insufficient documentation

## 2022-11-17 DIAGNOSIS — T8189XA Other complications of procedures, not elsewhere classified, initial encounter: Secondary | ICD-10-CM

## 2022-11-17 DIAGNOSIS — F4320 Adjustment disorder, unspecified: Secondary | ICD-10-CM | POA: Diagnosis not present

## 2022-11-18 DIAGNOSIS — M869 Osteomyelitis, unspecified: Secondary | ICD-10-CM | POA: Diagnosis not present

## 2022-11-18 DIAGNOSIS — M810 Age-related osteoporosis without current pathological fracture: Secondary | ICD-10-CM | POA: Diagnosis not present

## 2022-11-18 DIAGNOSIS — G894 Chronic pain syndrome: Secondary | ICD-10-CM | POA: Diagnosis not present

## 2022-11-18 DIAGNOSIS — M1990 Unspecified osteoarthritis, unspecified site: Secondary | ICD-10-CM | POA: Diagnosis not present

## 2022-11-18 DIAGNOSIS — F32A Depression, unspecified: Secondary | ICD-10-CM | POA: Diagnosis not present

## 2022-11-18 DIAGNOSIS — T8469XD Infection and inflammatory reaction due to internal fixation device of other site, subsequent encounter: Secondary | ICD-10-CM | POA: Diagnosis not present

## 2022-11-18 DIAGNOSIS — T8131XD Disruption of external operation (surgical) wound, not elsewhere classified, subsequent encounter: Secondary | ICD-10-CM | POA: Diagnosis not present

## 2022-11-18 DIAGNOSIS — F1911 Other psychoactive substance abuse, in remission: Secondary | ICD-10-CM | POA: Diagnosis not present

## 2022-11-18 DIAGNOSIS — F909 Attention-deficit hyperactivity disorder, unspecified type: Secondary | ICD-10-CM | POA: Diagnosis not present

## 2022-11-18 DIAGNOSIS — Z452 Encounter for adjustment and management of vascular access device: Secondary | ICD-10-CM | POA: Diagnosis not present

## 2022-11-19 ENCOUNTER — Other Ambulatory Visit: Payer: Self-pay

## 2022-11-19 ENCOUNTER — Ambulatory Visit (HOSPITAL_COMMUNITY)
Admission: RE | Admit: 2022-11-19 | Discharge: 2022-11-19 | Disposition: A | Discharge status: Home / Self Care | Payer: Commercial Managed Care - PPO | Attending: Vascular Surgery | Admitting: Vascular Surgery

## 2022-11-19 ENCOUNTER — Encounter (HOSPITAL_COMMUNITY)
Admission: RE | Disposition: A | Discharge status: Home / Self Care | Payer: Self-pay | Source: Home / Self Care | Attending: Vascular Surgery

## 2022-11-19 DIAGNOSIS — T8189XA Other complications of procedures, not elsewhere classified, initial encounter: Secondary | ICD-10-CM | POA: Diagnosis not present

## 2022-11-19 DIAGNOSIS — Y839 Surgical procedure, unspecified as the cause of abnormal reaction of the patient, or of later complication, without mention of misadventure at the time of the procedure: Secondary | ICD-10-CM | POA: Insufficient documentation

## 2022-11-19 DIAGNOSIS — I739 Peripheral vascular disease, unspecified: Secondary | ICD-10-CM | POA: Diagnosis not present

## 2022-11-19 DIAGNOSIS — M9689 Other intraoperative and postprocedural complications and disorders of the musculoskeletal system: Secondary | ICD-10-CM

## 2022-11-19 HISTORY — PX: ABDOMINAL AORTOGRAM W/LOWER EXTREMITY: CATH118223

## 2022-11-19 LAB — POCT I-STAT, CHEM 8
BUN: 19 mg/dL (ref 8–23)
Calcium, Ion: 1.1 mmol/L — ABNORMAL LOW (ref 1.15–1.40)
Chloride: 104 mmol/L (ref 98–111)
Creatinine, Ser: 0.8 mg/dL (ref 0.44–1.00)
Glucose, Bld: 89 mg/dL (ref 70–99)
HCT: 41 % (ref 36.0–46.0)
Hemoglobin: 13.9 g/dL (ref 12.0–15.0)
Potassium: 4.9 mmol/L (ref 3.5–5.1)
Sodium: 139 mmol/L (ref 135–145)
TCO2: 27 mmol/L (ref 22–32)

## 2022-11-19 SURGERY — ABDOMINAL AORTOGRAM W/LOWER EXTREMITY
Anesthesia: LOCAL

## 2022-11-19 MED ORDER — SODIUM CHLORIDE 0.9 % IV SOLN: INTRAVENOUS | Status: DC

## 2022-11-19 MED ORDER — SODIUM CHLORIDE 0.9% FLUSH
3.0000 mL | INTRAVENOUS | Status: DC | PRN
Start: 2022-11-19 — End: 2022-11-19

## 2022-11-19 MED ORDER — FENTANYL CITRATE (PF) 100 MCG/2ML IJ SOLN
INTRAMUSCULAR | Status: AC
  Filled 2022-11-19: qty 2

## 2022-11-19 MED ORDER — SODIUM CHLORIDE 0.9% FLUSH: 3.0000 mL | Freq: Two times a day (BID) | INTRAVENOUS | Status: DC

## 2022-11-19 MED ORDER — MIDAZOLAM HCL 2 MG/2ML IJ SOLN
INTRAMUSCULAR | Status: AC
  Filled 2022-11-19: qty 2

## 2022-11-19 MED ORDER — LIDOCAINE HCL (PF) 1 % IJ SOLN
INTRAMUSCULAR | Status: DC | PRN
  Administered 2022-11-19: 15 mL via INTRADERMAL

## 2022-11-19 MED ORDER — SODIUM CHLORIDE 0.9 % IV SOLN
250.0000 mL | INTRAVENOUS | Status: DC | PRN
Start: 2022-11-19 — End: 2022-11-19

## 2022-11-19 MED ORDER — LIDOCAINE HCL (PF) 1 % IJ SOLN
INTRAMUSCULAR | Status: AC
  Filled 2022-11-19: qty 30

## 2022-11-19 MED ORDER — MIDAZOLAM HCL 2 MG/2ML IJ SOLN
INTRAMUSCULAR | Status: DC | PRN
  Administered 2022-11-19: 1 mg via INTRAVENOUS

## 2022-11-19 MED ORDER — LABETALOL HCL 5 MG/ML IV SOLN: 10.0000 mg | INTRAVENOUS | Status: DC | PRN

## 2022-11-19 MED ORDER — FENTANYL CITRATE (PF) 100 MCG/2ML IJ SOLN
INTRAMUSCULAR | Status: DC | PRN
  Administered 2022-11-19: 25 ug via INTRAVENOUS

## 2022-11-19 MED ORDER — HYDRALAZINE HCL 20 MG/ML IJ SOLN: 5.0000 mg | INTRAMUSCULAR | Status: DC | PRN

## 2022-11-19 MED ORDER — IODIXANOL 320 MG/ML IV SOLN
INTRAVENOUS | Status: DC | PRN
  Administered 2022-11-19: 50 mL via INTRA_ARTERIAL

## 2022-11-19 MED ORDER — ACETAMINOPHEN 325 MG PO TABS: 650.0000 mg | ORAL_TABLET | ORAL | Status: DC | PRN

## 2022-11-19 MED ORDER — ONDANSETRON HCL 4 MG/2ML IJ SOLN: 4.0000 mg | Freq: Four times a day (QID) | INTRAMUSCULAR | Status: DC | PRN

## 2022-11-19 MED ORDER — HEPARIN (PORCINE) IN NACL 1000-0.9 UT/500ML-% IV SOLN
INTRAVENOUS | Status: DC | PRN
  Administered 2022-11-19: 500 mL

## 2022-11-19 SURGICAL SUPPLY — 10 items
CATH OMNI FLUSH 5F 65CM (CATHETERS) IMPLANT
COVER DOME SNAP 22 D (MISCELLANEOUS) IMPLANT
DEVICE CLOSURE MYNXGRIP 5F (Vascular Products) IMPLANT
KIT MICROPUNCTURE NIT STIFF (SHEATH) IMPLANT
SET ATX-X65L (MISCELLANEOUS) IMPLANT
SHEATH PINNACLE 5F 10CM (SHEATH) IMPLANT
SHEATH PROBE COVER 6X72 (BAG) IMPLANT
TRANSDUCER W/STOPCOCK (MISCELLANEOUS) ×1 IMPLANT
TRAY PV CATH (CUSTOM PROCEDURE TRAY) ×1 IMPLANT
WIRE BENTSON .035X145CM (WIRE) IMPLANT

## 2022-11-19 NOTE — H&P (Signed)
History and Physical Interval Note:  11/19/2022 12:32 PM  [name]  has presented today for surgery, with the diagnosis of nonhealing right foot surgical wound.  The various methods of treatment have been discussed with the patient and family. After consideration of risks, benefits and other options for treatment, the patient has consented to  Procedure(s): ABDOMINAL AORTOGRAM W/LOWER EXTREMITY (N/A) as a surgical intervention.  The patient's history has been reviewed, patient examined, no change in status, stable for surgery.  I have reviewed the patient's chart and labs.  Questions were answered to the patient's satisfaction.     Cephus Shelling     Patient name: [name] MRN: [id number]        DOB: 24-May-1955        Sex: female   REASON FOR CONSULT: Evaluate for PAD given nonhealing surgical wound to right lower extremity   HPI: [name] is a 67 y.o. female, with history of hypertension that presents for evaluation of PAD in her right lower extremity due to nonhealing surgical wound.  Previously had a right pilon ankle fracture with both lateral and medial malleolus fracture.  She has undergone surgical intervention with orthopedic surgery by Dr. Susa Simmonds with ORIF on 02/03/22.  She subsequently had dehiscence requiring skin substitute on 04/16/2022.  She has then required multiple additional interventions with plastic surgery and orthopedics for wound dehiscence and nonhealing wounds including 06/07/2022 and 07/22/2022 with plastic surgery.   I was contacted by Dr. Susa Simmonds as she needs additional revision and he is worried about her inflow for healing.   She states the wound still has not healed.       Past Medical History:  Diagnosis Date   ADHD (attention deficit hyperactivity disorder)     Anemia     Anxiety     ARDS (adult respiratory distress syndrome) (HCC) 2007   Arthritis     Depression     Gallstones 01/07/2018   Ganglion cyst of dorsum  of right wrist     Headache     Hepatitis      2004 non detectable now   Hypertension     MRSA (methicillin resistant Staphylococcus aureus) 2007   Osteoporosis     Pneumonia 2009   Prosthetic eye globe      right eye    Raynaud's disease                 Past Surgical History:  Procedure Laterality Date   ABDOMINAL HYSTERECTOMY       ANTERIOR CERVICAL DECOMP/DISCECTOMY FUSION N/A 12/12/2014    Procedure: ANTERIOR CERVICAL DECOMPRESSION/DISCECTOMY FUSION 3 LEVELS;  Surgeon: Estill Bamberg, MD;  Location: [facility] OR;  Service: Orthopedics;  Laterality: N/A;  Anterior cervical decompression fusion, cerivcal 5-6, cervical 6-7, cervical 7-thoracic 1 with instrumentation and allografrt   APLIGRAFT PLACEMENT Right 04/16/2022    Procedure: APPLICATION OF SKIN SUBSTITUTE GRAFT;  Surgeon: Terance Hart, MD;  Location: [facility] OR;  Service: Orthopedics;  Laterality: Right;   APPENDECTOMY   1974   APPLICATION OF WOUND VAC Left 07/22/2022    Procedure: APPLICATION OF WOUND VAC AND MYRIAD;  Surgeon: Peggye Form, DO;  Location: [facility] OR;  Service: Plastics;  Laterality: Left;   CATARACT EXTRACTION Left 11/2018   CHOLECYSTECTOMY N/A 01/07/2018    Procedure: LAPAROSCOPIC CHOLECYSTECTOMY WITH INTRAOPERATIVE CHOLANGIOGRAM;  Surgeon: Claud Kelp, MD;  Location: [facility] OR;  Service: General;  Laterality: N/A;   COLONOSCOPY   04/14/2013  per Dr. Christella Hartigan, clear, repeat in 10 yrs    CORNEAL TRANSPLANT Right 2007    x4   ENUCLEATION Right 2013    eye infection   GANGLION CYST EXCISION Right 05/25/2016    Procedure: RIGHT WRIST ULNAR GANGLION EXCISION;  Surgeon: Mack Hook, MD;  Location: [facility];  Service: Orthopedics;  Laterality: Right;   HARDWARE REMOVAL Right 08/20/2022    Procedure: HARDWARE REMOVAL;  Surgeon: Terance Hart, MD;  Location: [facility] OR;  Service: Orthopedics;  Laterality: Right;   HEMATOMA EVACUATION Left 07/22/2022    Procedure: EVACUATION HEMATOMA LEFT BACK  WITH EXCISION OF WOUND;  Surgeon: Peggye Form, DO;  Location: [facility] OR;  Service: Plastics;  Laterality: Left;   I & D EXTREMITY Right 05/28/2022    Procedure: right ankle wound debridement with myriad placement;  Surgeon: Peggye Form, DO;  Location: [facility];  Service: Plastics;  Laterality: Right;   I & D EXTREMITY Right 08/20/2022    Procedure: IRRIGATION AND DEBRIDEMENT EXTREMITY WITH WOUND VAC PLACEMENT;  Surgeon: Terance Hart, MD;  Location: [facility] OR;  Service: Orthopedics;  Laterality: Right;   INCISION AND DRAINAGE Right 07/22/2022    Procedure: INCISION AND DRAINAGE RIGHT ANKLE;  Surgeon: Peggye Form, DO;  Location: [facility] OR;  Service: Plastics;  Laterality: Right;   MINOR HARDWARE REMOVAL Right 04/16/2022    Procedure: REPAIR OF SURGICAL WOUND DEHISCENCE RIGHT ANKLE, REMOVAL OF DEEP ORTHOPEDIC HARDWARE;  Surgeon: Terance Hart, MD;  Location: [facility] OR;  Service: Orthopedics;  Laterality: Right;   ORIF CLAVICULAR FRACTURE Left 02/03/2022    Procedure: OPEN REDUCTION INTERNAL FIXATION (ORIF) LEFT CLAVICULAR FRACTURE WITH CORACOLAVICULAR FIXATION;  Surgeon: Jones Broom, MD;  Location: [facility] OR;  Service: Orthopedics;  Laterality: Left;  BIOMET ZIP LOOP WITH CLAVICLE PLATES   ORIF FIBULA FRACTURE Right 02/03/2022    Procedure: OPEN TREATMENT OF RIGHT PILON ANKLE FRACTURE INCLUDING LATERAL MALLEOLUS FRACTURE;  Surgeon: Terance Hart, MD;  Location: [facility] OR;  Service: Orthopedics;  Laterality: Right;  LENGTH OF SURGERY: 120 MINUTES   REVERSE SHOULDER ARTHROPLASTY Right 07/10/2021    Procedure: REVERSE SHOULDER ARTHROPLASTY;  Surgeon: Jones Broom, MD;  Location: [facility];  Service: Orthopedics;  Laterality: Right;   RHINOPLASTY   1980   ROBOTIC ASSISTED LAPAROSCOPIC SACROCOLPOPEXY Bilateral 06/13/2020    Procedure: XI ROBOTIC ASSISTED LAPAROSCOPIC SACROCOLPOPEXY AND SUPRACERVICAL HYSTERECTOMY AND BILATERAL  SALPINGO OOPHERECTOMY;  Surgeon: Crist Fat, MD;  Location: [facility] ORS;  Service: Urology;  Laterality: Bilateral;   SYNDESMOSIS REPAIR Right 02/03/2022    Procedure: POSSIBLE OPEN TREATMENT RIGHT SYNDESMOSIS;  Surgeon: Terance Hart, MD;  Location: [facility] OR;  Service: Orthopedics;  Laterality: Right;   TONSILLECTOMY        removed in 3rd grade   TOTAL SHOULDER ARTHROPLASTY Left 03/13/2019    Procedure: TOTAL SHOULDER ARTHROPLASTY;  Surgeon: Jones Broom, MD;  Location: [facility];  Service: Orthopedics;  Laterality: Left;               Family History  Problem Relation Age of Onset   Breast cancer Maternal Aunt 60   Colon cancer Neg Hx            SOCIAL HISTORY: Social History         Socioeconomic History   Marital status: Single      Spouse name: Not on file   Number of children: Not on file   Years of education: Not on  file   Highest education level: Bachelor's degree (e.g., BA, AB, BS)  Occupational History   Not on file  Tobacco Use   Smoking status: Never   Smokeless tobacco: Never  Vaping Use   Vaping status: Never Used  Substance and Sexual Activity   Alcohol use: Not Currently      Comment: occ   Drug use: No   Sexual activity: Never      Birth control/protection: Post-menopausal  Other Topics Concern   Not on file  Social History Narrative   Not on file    Social Determinants of Health        Financial Resource Strain: Low Risk  (06/08/2022)    Received from [facility]    Overall Financial Resource Strain (CARDIA)     Difficulty of Paying Living Expenses: Not very hard  Food Insecurity: Unknown (07/22/2022)    Hunger Vital Sign     Worried About Running Out of Food in the Last Year: Never true     Ran Out of Food in the Last Year: Not on file  Transportation Needs: Unknown (07/22/2022)    PRAPARE - Transportation     Lack of Transportation (Medical): No     Lack of Transportation (Non-Medical): Not on file  Physical Activity: Sufficiently  Active (08/14/2021)    Exercise Vital Sign     Days of Exercise per Week: 4 days     Minutes of Exercise per Session: 90 min  Stress: No Stress Concern Present (08/14/2021)    [employer] of Occupational Health - Occupational Stress Questionnaire     Feeling of Stress : Not at all  Social Connections: Unknown (03/25/2022)    Received from [facility]    Social Network     Social Network: Not on file  Intimate Partner Violence: Not At Risk (06/30/2022)    Received from [facility], [facility]    HITS     Over the last 12 months how often did your partner physically hurt you?: 1     Over the last 12 months how often did your partner insult you or talk down to you?: 1     Over the last 12 months how often did your partner threaten you with physical harm?: 1     Over the last 12 months how often did your partner scream or curse at you?: 1      Allergies  No Known Allergies           Current Outpatient Medications  Medication Sig Dispense Refill   Acetaminophen Extra Strength 500 MG TABS Take 1,000 mg by mouth as needed (pain).       amphetamine-dextroamphetamine (ADDERALL XR) 20 MG 24 hr capsule Take 1 capsule (20 mg total) by mouth every morning. 30 capsule 0   amphetamine-dextroamphetamine (ADDERALL XR) 20 MG 24 hr capsule Take 1 capsule (20 mg total) by mouth every morning. 30 capsule 0   amphetamine-dextroamphetamine (ADDERALL XR) 20 MG 24 hr capsule Take 1 capsule (20 mg total) by mouth every morning. 30 capsule 0   celecoxib (CELEBREX) 200 MG capsule Take 1 capsule (200 mg total) by mouth 2 (two) times daily. 60 capsule 0   celecoxib (CELEBREX) 200 MG capsule Take 1 capsule (200 mg total) by mouth 2 (two) times daily. 180 capsule 3   cyanocobalamin 1000 MCG tablet Take 1 tablet (1,000 mcg total) by mouth daily. 30 tablet 0   cycloSPORINE (RESTASIS) 0.05 %  ophthalmic emulsion Place 1 drop into both eyes 2 times daily. (Patient taking differently: Place 2  drops into both eyes 2 (two) times daily.) 60 each 11   estradiol (ESTRACE VAGINAL) 0.1 MG/GM vaginal cream Apply a fingertip amount to urethra 2 times per week. (Patient taking differently: Place 1 Applicatorful vaginally once a week.) 42.5 g 3   nystatin (MYCOSTATIN) 100000 UNIT/ML suspension Take 5 mLs (500,000 Units total) by mouth 4 (four) times daily. (Patient taking differently: Take 5 mLs by mouth as needed (thrush).) 473 mL 2   ondansetron (ZOFRAN) 4 MG tablet Take 1 tablet (4 mg total) by mouth every 6 (six) hours as needed for nausea. 20 tablet 0   Oxycodone HCl 20 MG TABS Take 1 tablet (20 mg total) by mouth every 6 (six) hours as needed (pain). 120 tablet 0   Oxycodone HCl 20 MG TABS Take 1 tablet (20 mg total) by mouth every 6 (six) hours as needed (pain). 120 tablet 0   Oxycodone HCl 20 MG TABS Take 1 tablet (20 mg total) by mouth every 6 (six) hours as needed (pain). 120 tablet 0   polyethylene glycol powder (GLYCOLAX/MIRALAX) powder TAKE 17 GRAMS BY MOUTH 2 TIMES DAILY AS NEEDED. (Patient taking differently: Take 17 g by mouth as needed for moderate constipation.) 3162 g 1   pregabalin (LYRICA) 300 MG capsule Take 1 capsule (300 mg total) by mouth 2 (two) times daily. 60 capsule 5   senna-docusate (SENOKOT-S) 8.6-50 MG tablet Take 1 tablet by mouth 2 (two) times daily. 60 tablet 0   valACYclovir (VALTREX) 1000 MG tablet Take 1 tablet (1,000 mg total) by mouth 3 (three) times daily. (Patient taking differently: Take 1,000 mg by mouth daily.) 30 tablet 1   verapamil (CALAN-SR) 120 MG CR tablet Take 1 tablet (120 mg total) by mouth at bedtime. 90 tablet 0      No current facility-administered medications for this visit.        REVIEW OF SYSTEMS:  [X]  denotes positive finding, [ ]  denotes negative finding Cardiac   Comments:  Chest pain or chest pressure:      Shortness of breath upon exertion:      Short of breath when lying flat:      Irregular heart rhythm:              Vascular      Pain in calf, thigh, or hip brought on by ambulation:      Pain in feet at night that wakes you up from your sleep:       Blood clot in your veins:      Leg swelling:              Pulmonary      Oxygen at home:      Productive cough:       Wheezing:              Neurologic      Sudden weakness in arms or legs:       Sudden numbness in arms or legs:       Sudden onset of difficulty speaking or slurred speech:      Temporary loss of vision in one eye:       Problems with dizziness:              Gastrointestinal      Blood in stool:       Vomited blood:  Genitourinary      Burning when urinating:       Blood in urine:             Psychiatric      Major depression:              Hematologic      Bleeding problems:      Problems with blood clotting too easily:             Skin      Rashes or ulcers:             Constitutional      Fever or chills:          PHYSICAL EXAM: There were no vitals filed for this visit.   GENERAL: The patient is a well-nourished female, in no acute distress. The vital signs are documented above. CARDIAC: There is a regular rate and rhythm.  VASCULAR:  Bilateral femoral pulses palpable Right popliteal pulse palpable No palpable right pedal pulses with wound shown PULMONARY: No respiratory distress. ABDOMEN: Soft and non-tender. MUSCULOSKELETAL: There are no major deformities or cyanosis. NEUROLOGIC: No focal weakness or paresthesias are detected. PSYCHIATRIC: The patient has a normal affect.      DATA:    ABIs on 11/03/2022 are 1.06 on the right triphasic and 1.20 in the left biphasic.  On the right her toe pressures 35 and suboptimal.   Assessment/Plan:   67 y.o. female, with history of hypertension that presents for evaluation of PAD in her right lower extremity due to nonhealing surgical wound.  Previously had a right pilon ankle fracture with both lateral and medial malleolus fracture early this year.   She has undergone surgical intervention with orthopedic surgery by Dr. Susa Simmonds with ORIF on 02/03/22.  She subsequently had dehiscence requiring skin substitute on 04/16/2022.  She then required multiple additional interventions with plastic surgery and orthopedics for wound dehiscence and nonhealing wound including 06/07/2022 and 07/22/2022 with plastic surgery.   Discussed I cannot palpate any pedal pulses on exam.  She does have a palpable popliteal pulse so I suspect this is either tibial or small vessel disease.  Her toe pressures on the right 35 mm Hg and discussed inadequate for wound healing.  I discussed she needs a toe pressure of 60-80 mm Hg to optimize her chances of wound healing.  I have recommended aortogram, lower extremity arteriogram with a focus on the right leg.  I suspect this will be a tibial intervention likely angioplasty.  Risks benefits discussed.  Will try get scheduled for Thursday and if there is delays with insurance we will do it next week.  Discussed this being done in the Cath Lab under moderate sedation.     Cephus Shelling, MD [facility] Office: [phone]

## 2022-11-19 NOTE — Op Note (Signed)
Patient name: [name] MRN: [id number] DOB: 1955/03/11 Sex: female  11/19/2022 Pre-operative Diagnosis: Nonhealing surgical wound right lower extremity Post-operative diagnosis:  Same Surgeon:  Cephus Shelling, MD Procedure Performed: 1.  Ultrasound-guided access left common femoral artery 2.  Aortogram with catheter selection of aorta 3.  Right lower extremity arteriogram with catheter selection of right external iliac artery 4.  Mynx closure of the left common femoral artery 5.  25 minutes of monitored moderate conscious sedation time  Indications: Patient is a 67 year old female seen for evaluation of PAD in the setting of nonhealing right lower extremity surgical wound.  She had a right pilon ankle fracture with both lateral and medial malleolus fractures and has undergone multiple orthopedic interventions with subsequent dehiscence of the surgical wound.  Vascular surgery has been asked to evaluate her inflow.  She presents after risks benefits discussed.  Findings:   Ultrasound-guided access left common femoral artery.  Aortogram showed patent infrarenal aorta with patent renal arteries bilaterally.  He does have some plaque at the iliac bifurcation that is not flow-limiting.  The right common and external iliac artery are widely patent.  The right common femoral, profunda, and SFA are widely patent.  The right above and below the knee popliteal artery is widely patent.  She has a patent trifurcation in the tibial vessels.  Dominant flow into the foot is through the anterior tibial that is inline below the ankle.  The peroneal and posterior tibial occluded above the ankle joint.  She has small vessel disease in the right foot with an incomplete plantar arch.   Procedure:  The patient was identified in the holding area and taken to room 8.  The patient was then placed supine on the table and prepped and draped in the usual sterile fashion.  A time out was called.   Patient received Versed and fentanyl for conscious moderate sedation.  Vital signs were monitored including heart rate, respiratory rate, oxygenation and blood pressure.  I was present for all of moderate sedation.  Ultrasound was used to evaluate the left common femoral artery.  It was patent .  A digital ultrasound image was acquired.  A micropuncture needle was used to access the left common femoral artery under ultrasound guidance.  An 018 wire was advanced without resistance and a micropuncture sheath was placed.  The 018 wire was removed and a benson wire was placed.  The micropuncture sheath was exchanged for a 5 french sheath.  An omniflush catheter was advanced over the wire to the level of L-1.  An abdominal angiogram was obtained.  Next, using the omniflush catheter and a benson wire, the aortic bifurcation was crossed and the catheter was placed into theright external iliac artery and right runoff was obtained.  Ultimately after evaluating images no intervention was performed.  Mynx closure device was deployed in the left groin after removal of wires and catheters.  Plan: Patient is optimized from a vascular surgery standpoint.  She has inline flow in the right lower extremity through the anterior tibial with flow into the foot.  She does have significant small vessel disease that is not amendable to endovascular intervention as we discussed with her today.  Cephus Shelling, MD [facility] Office: [phone]

## 2022-11-20 ENCOUNTER — Encounter (HOSPITAL_COMMUNITY): Payer: Self-pay | Admitting: Vascular Surgery

## 2022-11-20 DIAGNOSIS — T8131XD Disruption of external operation (surgical) wound, not elsewhere classified, subsequent encounter: Secondary | ICD-10-CM | POA: Diagnosis not present

## 2022-11-20 DIAGNOSIS — F909 Attention-deficit hyperactivity disorder, unspecified type: Secondary | ICD-10-CM | POA: Diagnosis not present

## 2022-11-20 DIAGNOSIS — F1911 Other psychoactive substance abuse, in remission: Secondary | ICD-10-CM | POA: Diagnosis not present

## 2022-11-20 DIAGNOSIS — F32A Depression, unspecified: Secondary | ICD-10-CM | POA: Diagnosis not present

## 2022-11-20 DIAGNOSIS — Z452 Encounter for adjustment and management of vascular access device: Secondary | ICD-10-CM | POA: Diagnosis not present

## 2022-11-20 DIAGNOSIS — T8469XD Infection and inflammatory reaction due to internal fixation device of other site, subsequent encounter: Secondary | ICD-10-CM | POA: Diagnosis not present

## 2022-11-20 DIAGNOSIS — M1990 Unspecified osteoarthritis, unspecified site: Secondary | ICD-10-CM | POA: Diagnosis not present

## 2022-11-20 DIAGNOSIS — M810 Age-related osteoporosis without current pathological fracture: Secondary | ICD-10-CM | POA: Diagnosis not present

## 2022-11-20 DIAGNOSIS — M869 Osteomyelitis, unspecified: Secondary | ICD-10-CM | POA: Diagnosis not present

## 2022-11-20 DIAGNOSIS — G894 Chronic pain syndrome: Secondary | ICD-10-CM | POA: Diagnosis not present

## 2022-11-21 ENCOUNTER — Other Ambulatory Visit: Payer: Self-pay | Admitting: Orthopaedic Surgery

## 2022-11-23 NOTE — Progress Notes (Signed)
Anesthesia Review:  PCP: Gershon Crane- Last video visit- 10/02/22  Cardiologist : Purvis Sheffield- christopher clark- Last OV 11/17/22  EKG : 07/22/22  Echo : Stress test: Cardiac Cath :  Activity level:  Sleep Study/ CPAP : Fasting Blood Sugar :      / Checks Blood Sugar -- times a day:   Blood Thinner/ Instructions /Last Dose: ASA / Instructions/ Last Dose :    11/19/22- peripheral Catheteruzation

## 2022-11-24 ENCOUNTER — Other Ambulatory Visit: Payer: Self-pay

## 2022-11-24 ENCOUNTER — Encounter: Payer: Commercial Managed Care - PPO | Admitting: Vascular Surgery

## 2022-11-24 ENCOUNTER — Encounter (HOSPITAL_COMMUNITY): Payer: Self-pay

## 2022-11-24 ENCOUNTER — Encounter (HOSPITAL_COMMUNITY)
Admission: RE | Admit: 2022-11-24 | Discharge: 2022-11-24 | Disposition: A | Discharge status: Home / Self Care | Payer: Commercial Managed Care - PPO | Source: Ambulatory Visit | Attending: Orthopaedic Surgery | Admitting: Orthopaedic Surgery

## 2022-11-24 DIAGNOSIS — Z01812 Encounter for preprocedural laboratory examination: Secondary | ICD-10-CM | POA: Insufficient documentation

## 2022-11-24 DIAGNOSIS — F4320 Adjustment disorder, unspecified: Secondary | ICD-10-CM | POA: Diagnosis not present

## 2022-11-24 LAB — COMPREHENSIVE METABOLIC PANEL
ALT: 23 U/L (ref 0–44)
AST: 31 U/L (ref 15–41)
Albumin: 4.3 g/dL (ref 3.5–5.0)
Alkaline Phosphatase: 88 U/L (ref 38–126)
Anion gap: 7 (ref 5–15)
BUN: 13 mg/dL (ref 8–23)
CO2: 27 mmol/L (ref 22–32)
Calcium: 8.8 mg/dL — ABNORMAL LOW (ref 8.9–10.3)
Chloride: 101 mmol/L (ref 98–111)
Creatinine, Ser: 0.56 mg/dL (ref 0.44–1.00)
GFR, Estimated: >60 mL/min (ref >60)
Glucose, Bld: 93 mg/dL (ref 70–99)
Potassium: 4 mmol/L (ref 3.5–5.1)
Sodium: 135 mmol/L (ref 135–145)
Total Bilirubin: 0.6 mg/dL (ref 0.3–1.2)
Total Protein: 8.3 g/dL — ABNORMAL HIGH (ref 6.5–8.1)

## 2022-11-24 LAB — CBC
HCT: 41.6 % (ref 36.0–46.0)
Hemoglobin: 13.1 g/dL (ref 12.0–15.0)
MCH: 30.1 pg (ref 26.0–34.0)
MCHC: 31.5 g/dL (ref 30.0–36.0)
MCV: 95.6 fL (ref 80.0–100.0)
Platelets: 144 10*3/uL — ABNORMAL LOW (ref 150–400)
RBC: 4.35 MIL/uL (ref 3.87–5.11)
RDW: 14.4 % (ref 11.5–15.5)
WBC: 5.5 10*3/uL (ref 4.0–10.5)
nRBC: 0 % (ref 0.0–0.2)

## 2022-11-24 NOTE — Patient Instructions (Addendum)
SURGICAL WAITING ROOM VISITATION  Patients having surgery or a procedure may have no more than 2 support people in the waiting area - these visitors may rotate.    Children under the age of 72 must have an adult with them who is not the patient.  Due to an increase in RSV and influenza rates and associated hospitalizations, children ages 19 and under may not visit patients in [facility] hospitals.  If the patient needs to stay at the hospital during part of their recovery, the visitor guidelines for inpatient rooms apply. Pre-op nurse will coordinate an appropriate time for 1 support person to accompany patient in pre-op.  This support person may not rotate.    Please refer to the [facility] website for the visitor guidelines for Inpatients (after your surgery is over and you are in a regular room).       Your procedure is scheduled on:  11/26/22    Report to [facility] Main Entrance    Report to admitting at   1000AM   Call this number if you have problems the morning of surgery [phone]   Do not eat food :After Midnight.   After Midnight you may have the following liquids until __0900____ AM DAY OF SURGERY  Water Non-Citrus Juices (without pulp, NO RED-Apple, White grape, White cranberry) Black Coffee (NO MILK/CREAM OR CREAMERS, sugar ok)  Clear Tea (NO MILK/CREAM OR CREAMERS, sugar ok) regular and decaf                             Plain Jell-O (NO RED)                                           Fruit ices (not with fruit pulp, NO RED)                                     Popsicles (NO RED)                                                               Sports drinks like Gatorade (NO RED)   Ensure Pre Surgery drink have completed by 0900am morning of surgery.                         If you have questions, please contact your surgeon's office.       Oral Hygiene is also important to reduce your risk of infection.                                    Remember -  BRUSH YOUR TEETH THE MORNING OF SURGERY WITH YOUR REGULAR TOOTHPASTE  DENTURES WILL BE REMOVED PRIOR TO SURGERY PLEASE DO NOT APPLY "Poly grip" OR ADHESIVES!!!   Do NOT smoke after Midnight   Stop all vitamins and herbal supplements 7 days before surgery.   Take these medicines the morning of surgery with A SIP OF WATER:  zyrtec, lyrica   DO NOT TAKE ANY ORAL DIABETIC MEDICATIONS DAY OF YOUR SURGERY  Bring CPAP mask and tubing day of surgery.                              You may not have any metal on your body including hair pins, jewelry, and body piercing             Do not wear make-up, lotions, powders, perfumes/cologne, or deodorant  Do not wear nail polish including gel and S&S, artificial/acrylic nails, or any other type of covering on natural nails including finger and toenails. If you have artificial nails, gel coating, etc. that needs to be removed by a nail salon please have this removed prior to surgery or surgery may need to be canceled/ delayed if the surgeon/ anesthesia feels like they are unable to be safely monitored.   Do not shave  48 hours prior to surgery.               Men may shave face and neck.   Do not bring valuables to the hospital. [facility] IS NOT             RESPONSIBLE   FOR VALUABLES.   Contacts, glasses, dentures or bridgework may not be worn into surgery.   Bring small overnight bag day of surgery.   DO NOT BRING YOUR HOME MEDICATIONS TO THE HOSPITAL. PHARMACY WILL DISPENSE MEDICATIONS LISTED ON YOUR MEDICATION LIST TO YOU DURING YOUR ADMISSION IN THE HOSPITAL!    Patients discharged on the day of surgery will not be allowed to drive home.  Someone NEEDS to stay with you for the first 24 hours after anesthesia.   Special Instructions: Bring a copy of your healthcare power of attorney and living will documents the day of surgery if you haven't scanned them before.              Please read over the following fact sheets you were given: IF YOU  HAVE QUESTIONS ABOUT YOUR PRE-OP INSTRUCTIONS PLEASE CALL [phone]   If you received a COVID test during your pre-op visit  it is requested that you wear a mask when out in public, stay away from anyone that may not be feeling well and notify your surgeon if you develop symptoms. If you test positive for Covid or have been in contact with anyone that has tested positive in the last 10 days please notify you surgeon.    [facility] - Preparing for Surgery Before surgery, you can play an important role.  Because skin is not sterile, your skin needs to be as free of germs as possible.  You can reduce the number of germs on your skin by washing with CHG (chlorahexidine gluconate) soap before surgery.  CHG is an antiseptic cleaner which kills germs and bonds with the skin to continue killing germs even after washing. Please DO NOT use if you have an allergy to CHG or antibacterial soaps.  If your skin becomes reddened/irritated stop using the CHG and inform your nurse when you arrive at Short Stay. Do not shave (including legs and underarms) for at least 48 hours prior to the first CHG shower.  You may shave your face/neck. Please follow these instructions carefully:  1.  Shower with CHG Soap the night before surgery and the  morning of Surgery.  2.  If you choose to wash your hair, wash your  hair first as usual with your  normal  shampoo.  3.  After you shampoo, rinse your hair and body thoroughly to remove the  shampoo.                           4.  Use CHG as you would any other liquid soap.  You can apply chg directly  to the skin and wash                       Gently with a scrungie or clean washcloth.  5.  Apply the CHG Soap to your body ONLY FROM THE NECK DOWN.   Do not use on face/ open                           Wound or open sores. Avoid contact with eyes, ears mouth and genitals (private parts).                       Wash face,  Genitals (private parts) with your normal soap.             6.   Wash thoroughly, paying special attention to the area where your surgery  will be performed.  7.  Thoroughly rinse your body with warm water from the neck down.  8.  DO NOT shower/wash with your normal soap after using and rinsing off  the CHG Soap.                9.  Pat yourself dry with a clean towel.            10.  Wear clean pajamas.            11.  Place clean sheets on your bed the night of your first shower and do not  sleep with pets. Day of Surgery : Do not apply any lotions/deodorants the morning of surgery.  Please wear clean clothes to the hospital/surgery center.  FAILURE TO FOLLOW THESE INSTRUCTIONS MAY RESULT IN THE CANCELLATION OF YOUR SURGERY PATIENT SIGNATURE_________________________________  NURSE SIGNATURE__________________________________  ________________________________________________________________________

## 2022-11-25 ENCOUNTER — Encounter: Payer: Self-pay | Admitting: Physician Assistant

## 2022-11-25 ENCOUNTER — Ambulatory Visit (INDEPENDENT_AMBULATORY_CARE_PROVIDER_SITE_OTHER): Payer: Commercial Managed Care - PPO | Admitting: Physician Assistant

## 2022-11-25 VITALS — BP 146/71 | HR 60 | Ht 61.5 in | Wt 105.0 lb

## 2022-11-25 DIAGNOSIS — S31000D Unspecified open wound of lower back and pelvis without penetration into retroperitoneum, subsequent encounter: Secondary | ICD-10-CM

## 2022-11-25 NOTE — Progress Notes (Signed)
Referring Provider Nelwyn Salisbury, MD [address] [place],  [place] [address]   CC:  Chief Complaint  Patient presents with   Follow-up      [name] is an 67 y.o. female.  HPI: Patient is a pleasant 67 year old female with history of low back hematoma s/p evacuation with placement of myriad and wound VAC performed 07/22/2022 by Dr. Ulice Bold who presents to clinic for postoperative follow-up.  Reviewed chart and patient has been treating her right sided low back wound with collagen dressings every other day with assistance of [facility] RN.  She was last seen here in clinic on 11/11/2022 at which point exam was largely reassuring.  Rolled border noted on exam, but improving.  No cellulitic changes.  She continues to follow orthopedics and vascular surgery for her RLE wound.  She is now s/p abdominal aortogram performed 11/19/2022 by vascular surgery in effort to optimize chances of wound healing for planned surgery with her orthopedist, Dr. Susa Simmonds.  Today, patient is doing well.  She states that she is scheduled for her ankle surgery tomorrow and that she is hopeful for good outcome.  As for her low back wound, she feels as though it is continuing to improve.  No new complaints.   No Known Allergies  Outpatient Encounter Medications as of 11/25/2022  Medication Sig Note   amphetamine-dextroamphetamine (ADDERALL XR) 20 MG 24 hr capsule Take 1 capsule (20 mg total) by mouth every morning.    BACTRIM DS 800-160 MG tablet Take 1 tablet by mouth 2 (two) times daily. 11/18/2022: Completed 11/18/22   celecoxib (CELEBREX) 200 MG capsule Take 1 capsule (200 mg total) by mouth 2 (two) times daily.    cetirizine (ZYRTEC) 10 MG tablet Take 10 mg by mouth daily.    cyanocobalamin 1000 MCG tablet Take 1 tablet (1,000 mcg total) by mouth daily.    cycloSPORINE (RESTASIS) 0.05 % ophthalmic emulsion Place 1 drop into both eyes 2 times daily. (Patient taking differently: Place 2 drops into  both eyes 2 (two) times daily as needed (Dry eyes).)    estradiol (ESTRACE VAGINAL) 0.1 MG/GM vaginal cream Apply a fingertip amount to urethra 2 times per week. (Patient taking differently: Place 1 Applicatorful vaginally once a week.)    Multiple Vitamins-Minerals (MULTIVITAMIN WITH MINERALS) tablet Take 1 tablet by mouth daily. With folic acid    nystatin (MYCOSTATIN) 100000 UNIT/ML suspension Take 5 mLs (500,000 Units total) by mouth 4 (four) times daily. (Patient taking differently: Take 5 mLs by mouth daily as needed (thrush).)    ondansetron (ZOFRAN) 4 MG tablet Take 1 tablet (4 mg total) by mouth every 6 (six) hours as needed for nausea.    Oxycodone HCl 20 MG TABS Take 1 tablet (20 mg total) by mouth every 6 (six) hours as needed (pain). (Patient taking differently: Take 20 mg by mouth 3 (three) times daily as needed (pain).)    polyethylene glycol powder (GLYCOLAX/MIRALAX) powder TAKE 17 GRAMS BY MOUTH 2 TIMES DAILY AS NEEDED. (Patient taking differently: Take 17 g by mouth as needed for moderate constipation.)    pregabalin (LYRICA) 300 MG capsule Take 1 capsule (300 mg total) by mouth 2 (two) times daily.    senna-docusate (SENOKOT-S) 8.6-50 MG tablet Take 1 tablet by mouth 2 (two) times daily.    valACYclovir (VALTREX) 1000 MG tablet Take 1 tablet (1,000 mg total) by mouth 3 (three) times daily. (Patient taking differently: Take 1,000 mg by mouth daily.) 08/14/2022: Continuous therapy  verapamil (CALAN-SR) 120 MG CR tablet Take 1 tablet (120 mg total) by mouth at bedtime.    No facility-administered encounter medications on file as of 11/25/2022.     Past Medical History:  Diagnosis Date   ADHD (attention deficit hyperactivity disorder)    Anemia    Anxiety    ARDS (adult respiratory distress syndrome) (HCC) 2007   Arthritis    Depression    Gallstones 01/07/2018   Ganglion cyst of dorsum of right wrist    Hepatitis    2004 non detectable now   MRSA (methicillin resistant  Staphylococcus aureus) 2007   Osteoporosis    Pneumonia 2009   Prosthetic eye globe    right eye    Raynaud's disease     Past Surgical History:  Procedure Laterality Date   ABDOMINAL AORTOGRAM W/LOWER EXTREMITY N/A 11/19/2022   Procedure: ABDOMINAL AORTOGRAM W/LOWER EXTREMITY;  Surgeon: Cephus Shelling, MD;  Location: [facility];  Service: Cardiovascular;  Laterality: N/A;   ABDOMINAL HYSTERECTOMY     ANTERIOR CERVICAL DECOMP/DISCECTOMY FUSION N/A 12/12/2014   Procedure: ANTERIOR CERVICAL DECOMPRESSION/DISCECTOMY FUSION 3 LEVELS;  Surgeon: Estill Bamberg, MD;  Location: [facility] OR;  Service: Orthopedics;  Laterality: N/A;  Anterior cervical decompression fusion, cerivcal 5-6, cervical 6-7, cervical 7-thoracic 1 with instrumentation and allografrt   APLIGRAFT PLACEMENT Right 04/16/2022   Procedure: APPLICATION OF SKIN SUBSTITUTE GRAFT;  Surgeon: Terance Hart, MD;  Location: [facility] OR;  Service: Orthopedics;  Laterality: Right;   APPENDECTOMY  1974   APPLICATION OF WOUND VAC Left 07/22/2022   Procedure: APPLICATION OF WOUND VAC AND MYRIAD;  Surgeon: Peggye Form, DO;  Location: [facility] OR;  Service: Plastics;  Laterality: Left;   CATARACT EXTRACTION Left 11/2018   CHOLECYSTECTOMY N/A 01/07/2018   Procedure: LAPAROSCOPIC CHOLECYSTECTOMY WITH INTRAOPERATIVE CHOLANGIOGRAM;  Surgeon: Claud Kelp, MD;  Location: [facility] OR;  Service: General;  Laterality: N/A;   COLONOSCOPY  04/14/2013   per Dr. Christella Hartigan, clear, repeat in 10 yrs    CORNEAL TRANSPLANT Right 2007   x4   ENUCLEATION Right 2013   eye infection   GANGLION CYST EXCISION Right 05/25/2016   Procedure: RIGHT WRIST ULNAR GANGLION EXCISION;  Surgeon: Mack Hook, MD;  Location: [facility];  Service: Orthopedics;  Laterality: Right;   HARDWARE REMOVAL Right 08/20/2022   Procedure: HARDWARE REMOVAL;  Surgeon: Terance Hart, MD;  Location: [facility] OR;  Service: Orthopedics;  Laterality: Right;   HEMATOMA  EVACUATION Left 07/22/2022   Procedure: EVACUATION HEMATOMA LEFT BACK WITH EXCISION OF WOUND;  Surgeon: Peggye Form, DO;  Location: [facility] OR;  Service: Plastics;  Laterality: Left;   I & D EXTREMITY Right 05/28/2022   Procedure: right ankle wound debridement with myriad placement;  Surgeon: Peggye Form, DO;  Location: [facility];  Service: Plastics;  Laterality: Right;   I & D EXTREMITY Right 08/20/2022   Procedure: IRRIGATION AND DEBRIDEMENT EXTREMITY WITH WOUND VAC PLACEMENT;  Surgeon: Terance Hart, MD;  Location: [facility] OR;  Service: Orthopedics;  Laterality: Right;   INCISION AND DRAINAGE Right 07/22/2022   Procedure: INCISION AND DRAINAGE RIGHT ANKLE;  Surgeon: Peggye Form, DO;  Location: [facility] OR;  Service: Plastics;  Laterality: Right;   MINOR HARDWARE REMOVAL Right 04/16/2022   Procedure: REPAIR OF SURGICAL WOUND DEHISCENCE RIGHT ANKLE, REMOVAL OF DEEP ORTHOPEDIC HARDWARE;  Surgeon: Terance Hart, MD;  Location: [facility] OR;  Service: Orthopedics;  Laterality: Right;   ORIF CLAVICULAR FRACTURE Left  02/03/2022   Procedure: OPEN REDUCTION INTERNAL FIXATION (ORIF) LEFT CLAVICULAR FRACTURE WITH CORACOLAVICULAR FIXATION;  Surgeon: Jones Broom, MD;  Location: [facility] OR;  Service: Orthopedics;  Laterality: Left;  BIOMET ZIP LOOP WITH CLAVICLE PLATES   ORIF FIBULA FRACTURE Right 02/03/2022   Procedure: OPEN TREATMENT OF RIGHT PILON ANKLE FRACTURE INCLUDING LATERAL MALLEOLUS FRACTURE;  Surgeon: Terance Hart, MD;  Location: [facility] OR;  Service: Orthopedics;  Laterality: Right;  LENGTH OF SURGERY: 120 MINUTES   REVERSE SHOULDER ARTHROPLASTY Right 07/10/2021   Procedure: REVERSE SHOULDER ARTHROPLASTY;  Surgeon: Jones Broom, MD;  Location: [facility]S;  Service: Orthopedics;  Laterality: Right;   RHINOPLASTY  1980   ROBOTIC ASSISTED LAPAROSCOPIC SACROCOLPOPEXY Bilateral 06/13/2020   Procedure: XI ROBOTIC ASSISTED LAPAROSCOPIC SACROCOLPOPEXY AND SUPRACERVICAL  HYSTERECTOMY AND BILATERAL  SALPINGO OOPHERECTOMY;  Surgeon: Crist Fat, MD;  Location: [facility] ORS;  Service: Urology;  Laterality: Bilateral;   SYNDESMOSIS REPAIR Right 02/03/2022   Procedure: POSSIBLE OPEN TREATMENT RIGHT SYNDESMOSIS;  Surgeon: Terance Hart, MD;  Location: [facility] OR;  Service: Orthopedics;  Laterality: Right;   TONSILLECTOMY     removed in 3rd grade   TOTAL SHOULDER ARTHROPLASTY Left 03/13/2019   Procedure: TOTAL SHOULDER ARTHROPLASTY;  Surgeon: Jones Broom, MD;  Location: [facility];  Service: Orthopedics;  Laterality: Left;    Family History  Problem Relation Age of Onset   Breast cancer Maternal Aunt 60   Colon cancer Neg Hx     Social History   Social History Narrative   Not on file     Review of Systems General: Denies fevers or chills Cardio: Denies chest pain Skin: Denies any worsening discomfort or drainage  Physical Exam    11/25/2022    8:21 AM 11/24/2022    3:55 PM 11/19/2022    3:36 PM  Vitals with BMI  Height 5' 1.5" 5\' 1"    Weight 105 lbs 105 lbs   BMI 19.52 19.85   Systolic 171 135 829  Diastolic 78 70 66  Pulse 66 63 71    General:  No acute distress, nontoxic appearing  Respiratory: No increased work of breathing Neuro: Alert and oriented Psychiatric: Normal mood and affect  Skin: Wound now measures only 3 x 3 x 0.1 cm, rolled edge inferiorly.  No surrounding erythema or induration otherwise concerning for infection.  No fluctuance or crepitus.  Nontender on exam.  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  Superficial at this time with rolled edge inferiorly.  No obvious tracking or tunneling.  No infection.  Suspect that the rolled border will continue to improve with time.   Recommend continued HHN and then follow-up in 3 weeks for likely final postoperative follow-up.  She can certainly call the clinic should she have any  questions or concerns in interim.  Evelena Leyden PA-C 11/25/2022, 8:37 AM

## 2022-11-26 ENCOUNTER — Inpatient Hospital Stay (HOSPITAL_COMMUNITY)
Admission: RE | Admit: 2022-11-26 | Discharge: 2022-11-30 | DRG: 904 | Disposition: A | Discharge status: Home / Self Care | Payer: Commercial Managed Care - PPO | Attending: Orthopaedic Surgery | Admitting: Orthopaedic Surgery

## 2022-11-26 ENCOUNTER — Encounter (HOSPITAL_COMMUNITY): Payer: Self-pay | Admitting: Orthopaedic Surgery

## 2022-11-26 ENCOUNTER — Encounter (HOSPITAL_COMMUNITY)
Admission: RE | Disposition: A | Discharge status: Home / Self Care | Payer: Self-pay | Source: Home / Self Care | Attending: Orthopaedic Surgery

## 2022-11-26 ENCOUNTER — Other Ambulatory Visit: Payer: Self-pay

## 2022-11-26 ENCOUNTER — Ambulatory Visit (HOSPITAL_COMMUNITY): Payer: Self-pay | Admitting: Certified Registered"

## 2022-11-26 DIAGNOSIS — M86661 Other chronic osteomyelitis, right tibia and fibula: Secondary | ICD-10-CM | POA: Diagnosis present

## 2022-11-26 DIAGNOSIS — Z803 Family history of malignant neoplasm of breast: Secondary | ICD-10-CM

## 2022-11-26 DIAGNOSIS — L02415 Cutaneous abscess of right lower limb: Secondary | ICD-10-CM | POA: Diagnosis present

## 2022-11-26 DIAGNOSIS — Z947 Corneal transplant status: Secondary | ICD-10-CM

## 2022-11-26 DIAGNOSIS — Z90711 Acquired absence of uterus with remaining cervical stump: Secondary | ICD-10-CM

## 2022-11-26 DIAGNOSIS — Z8614 Personal history of Methicillin resistant Staphylococcus aureus infection: Secondary | ICD-10-CM

## 2022-11-26 DIAGNOSIS — I739 Peripheral vascular disease, unspecified: Secondary | ICD-10-CM | POA: Diagnosis present

## 2022-11-26 DIAGNOSIS — Y848 Other medical procedures as the cause of abnormal reaction of the patient, or of later complication, without mention of misadventure at the time of the procedure: Secondary | ICD-10-CM | POA: Diagnosis present

## 2022-11-26 DIAGNOSIS — Z96611 Presence of right artificial shoulder joint: Secondary | ICD-10-CM | POA: Diagnosis present

## 2022-11-26 DIAGNOSIS — Z96612 Presence of left artificial shoulder joint: Secondary | ICD-10-CM | POA: Diagnosis present

## 2022-11-26 DIAGNOSIS — T8131XA Disruption of external operation (surgical) wound, not elsewhere classified, initial encounter: Principal | ICD-10-CM | POA: Diagnosis present

## 2022-11-26 DIAGNOSIS — L03115 Cellulitis of right lower limb: Secondary | ICD-10-CM

## 2022-11-26 DIAGNOSIS — L98495 Non-pressure chronic ulcer of skin of other sites with muscle involvement without evidence of necrosis: Secondary | ICD-10-CM | POA: Diagnosis not present

## 2022-11-26 DIAGNOSIS — M8668 Other chronic osteomyelitis, other site: Secondary | ICD-10-CM | POA: Diagnosis not present

## 2022-11-26 DIAGNOSIS — I10 Essential (primary) hypertension: Secondary | ICD-10-CM

## 2022-11-26 DIAGNOSIS — M869 Osteomyelitis, unspecified: Secondary | ICD-10-CM

## 2022-11-26 DIAGNOSIS — S81801A Unspecified open wound, right lower leg, initial encounter: Secondary | ICD-10-CM | POA: Diagnosis present

## 2022-11-26 DIAGNOSIS — Z01818 Encounter for other preprocedural examination: Principal | ICD-10-CM

## 2022-11-26 DIAGNOSIS — T81329A Deep disruption or dehiscence of operation wound, unspecified, initial encounter: Secondary | ICD-10-CM | POA: Diagnosis not present

## 2022-11-26 DIAGNOSIS — F418 Other specified anxiety disorders: Secondary | ICD-10-CM | POA: Diagnosis present

## 2022-11-26 DIAGNOSIS — M810 Age-related osteoporosis without current pathological fracture: Secondary | ICD-10-CM | POA: Diagnosis present

## 2022-11-26 HISTORY — PX: I & D EXTREMITY: SHX5045

## 2022-11-26 LAB — CBC
HCT: 41.5 % (ref 36.0–46.0)
Hemoglobin: 13.3 g/dL (ref 12.0–15.0)
MCH: 30.4 pg (ref 26.0–34.0)
MCHC: 32 g/dL (ref 30.0–36.0)
MCV: 95 fL (ref 80.0–100.0)
Platelets: 127 10*3/uL — ABNORMAL LOW (ref 150–400)
RBC: 4.37 MIL/uL (ref 3.87–5.11)
RDW: 14.3 % (ref 11.5–15.5)
WBC: 4.5 10*3/uL (ref 4.0–10.5)
nRBC: 0 % (ref 0.0–0.2)

## 2022-11-26 LAB — CREATININE, SERUM
Creatinine, Ser: 0.52 mg/dL (ref 0.44–1.00)
GFR, Estimated: >60 mL/min (ref >60)

## 2022-11-26 SURGERY — IRRIGATION AND DEBRIDEMENT EXTREMITY
Anesthesia: General | Site: Ankle | Laterality: Right

## 2022-11-26 MED ORDER — VITAMIN B-12 1000 MCG PO TABS
1000.0000 ug | ORAL_TABLET | Freq: Every day | ORAL | Status: DC
  Administered 2022-11-27 – 2022-11-30 (×4): 1000 ug via ORAL
  Filled 2022-11-26 (×4): qty 1

## 2022-11-26 MED ORDER — ONDANSETRON HCL 4 MG/2ML IJ SOLN: 4.0000 mg | Freq: Once | INTRAMUSCULAR | Status: DC | PRN

## 2022-11-26 MED ORDER — 0.9 % SODIUM CHLORIDE (POUR BTL) OPTIME
TOPICAL | Status: DC | PRN
  Administered 2022-11-26: 3000 mL

## 2022-11-26 MED ORDER — FENTANYL CITRATE (PF) 100 MCG/2ML IJ SOLN
INTRAMUSCULAR | Status: DC | PRN
  Administered 2022-11-26: 50 ug via INTRAVENOUS
  Administered 2022-11-26: 25 ug via INTRAVENOUS
  Administered 2022-11-26: 50 ug via INTRAVENOUS

## 2022-11-26 MED ORDER — AMPHETAMINE-DEXTROAMPHET ER 20 MG PO CP24: 20.0000 mg | ORAL_CAPSULE | ORAL | Status: DC

## 2022-11-26 MED ORDER — ONDANSETRON HCL 4 MG/2ML IJ SOLN: 4.0000 mg | Freq: Four times a day (QID) | INTRAMUSCULAR | Status: DC | PRN

## 2022-11-26 MED ORDER — PROPOFOL 10 MG/ML IV BOLUS
INTRAVENOUS | Status: DC | PRN
  Administered 2022-11-26: 150 mg via INTRAVENOUS

## 2022-11-26 MED ORDER — METHOCARBAMOL 1000 MG/10ML IJ SOLN
500.0000 mg | Freq: Four times a day (QID) | INTRAMUSCULAR | Status: DC | PRN
  Administered 2022-11-26: 500 mg via INTRAVENOUS

## 2022-11-26 MED ORDER — DIPHENHYDRAMINE HCL 12.5 MG/5ML PO ELIX: 12.5000 mg | ORAL_SOLUTION | ORAL | Status: DC | PRN

## 2022-11-26 MED ORDER — SODIUM CHLORIDE (PF) 0.9 % IJ SOLN
INTRAMUSCULAR | Status: DC | PRN
  Administered 2022-11-26: 10 mL

## 2022-11-26 MED ORDER — CYCLOSPORINE 0.05 % OP EMUL: 2.0000 [drp] | Freq: Two times a day (BID) | OPHTHALMIC | Status: DC | PRN

## 2022-11-26 MED ORDER — STERILE WATER FOR IRRIGATION IR SOLN
Status: DC | PRN
  Administered 2022-11-26: 1000 mL

## 2022-11-26 MED ORDER — PREGABALIN 100 MG PO CAPS
300.0000 mg | ORAL_CAPSULE | Freq: Two times a day (BID) | ORAL | Status: DC
  Administered 2022-11-26 – 2022-11-30 (×8): 300 mg via ORAL
  Filled 2022-11-26 (×8): qty 3

## 2022-11-26 MED ORDER — FENTANYL CITRATE PF 50 MCG/ML IJ SOSY
PREFILLED_SYRINGE | INTRAMUSCULAR | Status: AC
  Filled 2022-11-26: qty 1

## 2022-11-26 MED ORDER — PROPOFOL 10 MG/ML IV BOLUS
INTRAVENOUS | Status: AC
Start: 2022-11-26 — End: ?
  Filled 2022-11-26: qty 20

## 2022-11-26 MED ORDER — MIDAZOLAM HCL 2 MG/2ML IJ SOLN
INTRAMUSCULAR | Status: AC
  Filled 2022-11-26: qty 2

## 2022-11-26 MED ORDER — OXYCODONE HCL 5 MG PO TABS
5.0000 mg | ORAL_TABLET | ORAL | Status: DC | PRN
  Administered 2022-11-26 – 2022-11-30 (×10): 10 mg via ORAL
  Filled 2022-11-26 (×10): qty 2

## 2022-11-26 MED ORDER — ONDANSETRON HCL 4 MG/2ML IJ SOLN
INTRAMUSCULAR | Status: DC | PRN
  Administered 2022-11-26: 4 mg via INTRAVENOUS

## 2022-11-26 MED ORDER — AMISULPRIDE (ANTIEMETIC) 5 MG/2ML IV SOLN: 10.0000 mg | Freq: Once | INTRAVENOUS | Status: DC | PRN

## 2022-11-26 MED ORDER — VALACYCLOVIR HCL 500 MG PO TABS
1000.0000 mg | ORAL_TABLET | Freq: Every day | ORAL | Status: DC
  Administered 2022-11-26 – 2022-11-30 (×5): 1000 mg via ORAL
  Filled 2022-11-26 (×5): qty 2

## 2022-11-26 MED ORDER — ORAL CARE MOUTH RINSE: 15.0000 mL | Freq: Once | OROMUCOSAL | Status: AC

## 2022-11-26 MED ORDER — ACETAMINOPHEN 10 MG/ML IV SOLN
INTRAVENOUS | Status: AC
  Filled 2022-11-26: qty 100

## 2022-11-26 MED ORDER — DEXAMETHASONE SODIUM PHOSPHATE 10 MG/ML IJ SOLN
INTRAMUSCULAR | Status: AC
Start: 2022-11-26 — End: ?
  Filled 2022-11-26: qty 1

## 2022-11-26 MED ORDER — LORATADINE 10 MG PO TABS
10.0000 mg | ORAL_TABLET | Freq: Every day | ORAL | Status: DC
  Administered 2022-11-27 – 2022-11-30 (×4): 10 mg via ORAL
  Filled 2022-11-26 (×4): qty 1

## 2022-11-26 MED ORDER — ONDANSETRON HCL 4 MG/2ML IJ SOLN
INTRAMUSCULAR | Status: AC
  Filled 2022-11-26: qty 2

## 2022-11-26 MED ORDER — METOCLOPRAMIDE HCL 5 MG PO TABS: 5.0000 mg | ORAL_TABLET | Freq: Three times a day (TID) | ORAL | Status: DC | PRN

## 2022-11-26 MED ORDER — LIDOCAINE HCL (CARDIAC) PF 100 MG/5ML IV SOSY
PREFILLED_SYRINGE | INTRAVENOUS | Status: DC | PRN
  Administered 2022-11-26: 60 mg via INTRATRACHEAL

## 2022-11-26 MED ORDER — MIDAZOLAM HCL 2 MG/2ML IJ SOLN
INTRAMUSCULAR | Status: DC | PRN
  Administered 2022-11-26 (×2): 1 mg via INTRAVENOUS

## 2022-11-26 MED ORDER — METOCLOPRAMIDE HCL 5 MG/ML IJ SOLN: 5.0000 mg | Freq: Three times a day (TID) | INTRAMUSCULAR | Status: DC | PRN

## 2022-11-26 MED ORDER — CHLORHEXIDINE GLUCONATE 0.12 % MT SOLN
15.0000 mL | Freq: Once | OROMUCOSAL | Status: AC
  Administered 2022-11-26: 15 mL via OROMUCOSAL

## 2022-11-26 MED ORDER — FENTANYL CITRATE (PF) 100 MCG/2ML IJ SOLN
INTRAMUSCULAR | Status: AC
  Filled 2022-11-26: qty 2

## 2022-11-26 MED ORDER — CEFAZOLIN SODIUM-DEXTROSE 2-4 GM/100ML-% IV SOLN
2.0000 g | INTRAVENOUS | Status: AC
  Administered 2022-11-26: 2 g via INTRAVENOUS
  Filled 2022-11-26: qty 100

## 2022-11-26 MED ORDER — ACETAMINOPHEN 10 MG/ML IV SOLN
700.0000 mg | Freq: Once | INTRAVENOUS | Status: DC | PRN
  Administered 2022-11-26: 700 mg via INTRAVENOUS

## 2022-11-26 MED ORDER — ENOXAPARIN SODIUM 40 MG/0.4ML IJ SOSY: 40.0000 mg | PREFILLED_SYRINGE | INTRAMUSCULAR | Status: DC

## 2022-11-26 MED ORDER — DEXAMETHASONE SODIUM PHOSPHATE 10 MG/ML IJ SOLN
INTRAMUSCULAR | Status: DC | PRN
  Administered 2022-11-26: 4 mg via INTRAVENOUS

## 2022-11-26 MED ORDER — SODIUM CHLORIDE (PF) 0.9 % IJ SOLN
INTRAMUSCULAR | Status: AC
  Filled 2022-11-26: qty 50

## 2022-11-26 MED ORDER — SODIUM CHLORIDE (PF) 0.9 % IJ SOLN
INTRAMUSCULAR | Status: AC
  Filled 2022-11-26: qty 10

## 2022-11-26 MED ORDER — HYDROMORPHONE HCL 1 MG/ML IJ SOLN
0.5000 mg | INTRAMUSCULAR | Status: DC | PRN
  Administered 2022-11-27 (×2): 0.5 mg via INTRAVENOUS
  Administered 2022-11-27 – 2022-11-30 (×16): 1 mg via INTRAVENOUS
  Filled 2022-11-26 (×18): qty 1

## 2022-11-26 MED ORDER — METHOCARBAMOL 500 MG PO TABS
500.0000 mg | ORAL_TABLET | Freq: Four times a day (QID) | ORAL | Status: DC | PRN
  Administered 2022-11-26 – 2022-11-30 (×10): 500 mg via ORAL
  Filled 2022-11-26 (×10): qty 1

## 2022-11-26 MED ORDER — SENNA 8.6 MG PO TABS
1.0000 | ORAL_TABLET | Freq: Two times a day (BID) | ORAL | Status: DC
  Administered 2022-11-26 – 2022-11-30 (×8): 8.6 mg via ORAL
  Filled 2022-11-26 (×8): qty 1

## 2022-11-26 MED ORDER — VERAPAMIL HCL ER 120 MG PO TBCR
120.0000 mg | EXTENDED_RELEASE_TABLET | Freq: Every day | ORAL | Status: DC
  Administered 2022-11-26 – 2022-11-29 (×4): 120 mg via ORAL
  Filled 2022-11-26 (×5): qty 1

## 2022-11-26 MED ORDER — KETOROLAC TROMETHAMINE 15 MG/ML IJ SOLN
INTRAMUSCULAR | Status: AC
  Filled 2022-11-26: qty 1

## 2022-11-26 MED ORDER — ROCURONIUM BROMIDE 10 MG/ML (PF) SYRINGE
PREFILLED_SYRINGE | INTRAVENOUS | Status: AC
Start: 2022-11-26 — End: ?
  Filled 2022-11-26: qty 10

## 2022-11-26 MED ORDER — OXYCODONE HCL 5 MG PO TABS
20.0000 mg | ORAL_TABLET | Freq: Three times a day (TID) | ORAL | Status: DC | PRN
  Administered 2022-11-26 – 2022-11-30 (×10): 20 mg via ORAL
  Filled 2022-11-26 (×11): qty 4

## 2022-11-26 MED ORDER — ONDANSETRON HCL 4 MG PO TABS: 4.0000 mg | ORAL_TABLET | Freq: Four times a day (QID) | ORAL | Status: DC | PRN

## 2022-11-26 MED ORDER — LACTATED RINGERS IV SOLN: INTRAVENOUS | Status: AC

## 2022-11-26 MED ORDER — NALOXONE HCL 0.4 MG/ML IJ SOLN: 0.4000 mg | INTRAMUSCULAR | Status: DC | PRN

## 2022-11-26 MED ORDER — METHOCARBAMOL 1000 MG/10ML IJ SOLN
INTRAMUSCULAR | Status: AC
  Filled 2022-11-26: qty 10

## 2022-11-26 MED ORDER — FENTANYL CITRATE PF 50 MCG/ML IJ SOSY
PREFILLED_SYRINGE | INTRAMUSCULAR | Status: AC
  Filled 2022-11-26: qty 2

## 2022-11-26 MED ORDER — EPHEDRINE SULFATE-NACL 50-0.9 MG/10ML-% IV SOSY
PREFILLED_SYRINGE | INTRAVENOUS | Status: DC | PRN
  Administered 2022-11-26 (×5): 5 mg via INTRAVENOUS

## 2022-11-26 MED ORDER — ADULT MULTIVITAMIN W/MINERALS CH
1.0000 | ORAL_TABLET | Freq: Every day | ORAL | Status: DC
  Administered 2022-11-27 – 2022-11-30 (×4): 1 via ORAL
  Filled 2022-11-26 (×4): qty 1

## 2022-11-26 MED ORDER — CEFAZOLIN SODIUM-DEXTROSE 1-4 GM/50ML-% IV SOLN
1.0000 g | Freq: Four times a day (QID) | INTRAVENOUS | Status: AC
  Administered 2022-11-26 – 2022-11-27 (×3): 1 g via INTRAVENOUS
  Filled 2022-11-26 (×3): qty 50

## 2022-11-26 MED ORDER — ORAL CARE MOUTH RINSE: 15.0000 mL | OROMUCOSAL | Status: DC | PRN

## 2022-11-26 MED ORDER — FENTANYL CITRATE PF 50 MCG/ML IJ SOSY
25.0000 ug | PREFILLED_SYRINGE | INTRAMUSCULAR | Status: DC | PRN
Start: 2022-11-26 — End: 2022-11-26
  Administered 2022-11-26 (×3): 50 ug via INTRAVENOUS

## 2022-11-26 MED ORDER — ACETAMINOPHEN 500 MG PO TABS
500.0000 mg | ORAL_TABLET | ORAL | Status: AC | PRN
  Administered 2022-11-26 – 2022-11-28 (×4): 500 mg via ORAL
  Filled 2022-11-26 (×4): qty 1

## 2022-11-26 MED ORDER — KETOROLAC TROMETHAMINE 15 MG/ML IJ SOLN
15.0000 mg | Freq: Once | INTRAMUSCULAR | Status: AC | PRN
  Administered 2022-11-26: 15 mg via INTRAVENOUS

## 2022-11-26 SURGICAL SUPPLY — 40 items
BLADE SURG 15 STRL LF DISP TIS (BLADE) IMPLANT
BLADE SURG 15 STRL SS (BLADE) ×1
BNDG CMPR 5X4 KNIT ELC UNQ LF (GAUZE/BANDAGES/DRESSINGS) ×2
BNDG CMPR MED 10X6 ELC LF (GAUZE/BANDAGES/DRESSINGS)
BNDG ELASTIC 4INX 5YD STR LF (GAUZE/BANDAGES/DRESSINGS) IMPLANT
BNDG ELASTIC 6X10 VLCR STRL LF (GAUZE/BANDAGES/DRESSINGS) IMPLANT
CANISTER WOUND CARE 500ML ATS (WOUND CARE) IMPLANT
CNTNR URN SCR LID CUP LEK RST (MISCELLANEOUS) IMPLANT
CONT SPEC 4OZ STRL OR WHT (MISCELLANEOUS) ×2
CUFF TOURN SGL QUICK 42 (TOURNIQUET CUFF) ×1 IMPLANT
DERMATAC IMPLANT
DRAIN CHANNEL 19F RND (DRAIN) IMPLANT
DRAPE U-SHAPE 47X51 STRL (DRAPES) ×1 IMPLANT
DRSG ADAPTIC 3X8 NADH LF (GAUZE/BANDAGES/DRESSINGS) IMPLANT
DRSG VAC GRANUFOAM LG (GAUZE/BANDAGES/DRESSINGS) IMPLANT
DRSG VAC GRANUFOAM MED (GAUZE/BANDAGES/DRESSINGS) IMPLANT
DRSG VAC GRANUFOAM SM (GAUZE/BANDAGES/DRESSINGS) IMPLANT
DURAPREP 26ML APPLICATOR (WOUND CARE) ×1 IMPLANT
GAUZE XEROFORM 1X8 LF (GAUZE/BANDAGES/DRESSINGS) IMPLANT
GLOVE BIO SURGEON STRL SZ7.5 (GLOVE) ×1 IMPLANT
GLOVE BIOGEL PI IND STRL 8 (GLOVE) ×1 IMPLANT
KIT TURNOVER KIT A (KITS) IMPLANT
MANIFOLD NEPTUNE II (INSTRUMENTS) IMPLANT
MATRIX WOUND 3-LAYER 7X10 (Tissue) IMPLANT
MICROMATRIX 1000MG (Tissue) ×1 IMPLANT
PACK ORTHO EXTREMITY (CUSTOM PROCEDURE TRAY) ×1 IMPLANT
PAD CAST 4YDX4 CTTN HI CHSV (CAST SUPPLIES) IMPLANT
PADDING CAST COTTON 4X4 STRL (CAST SUPPLIES) ×2
PADDING CAST COTTON 6X4 STRL (CAST SUPPLIES) IMPLANT
SET IRRIG Y TYPE TUR BLADDER L (SET/KITS/TRAYS/PACK) IMPLANT
SOLUTION PARTIC MCRMTRX 1000MG (Tissue) IMPLANT
SPLINT FIBERGLASS 4X30 (CAST SUPPLIES) IMPLANT
STOCKINETTE 8 INCH (MISCELLANEOUS) ×1 IMPLANT
SUT ETHILON 2 0 PS N (SUTURE) IMPLANT
SUT PDS AB 2-0 CT2 27 (SUTURE) ×1 IMPLANT
SWAB COLLECTION DEVICE MRSA (MISCELLANEOUS) IMPLANT
SWAB CULTURE ESWAB REG 1ML (MISCELLANEOUS) IMPLANT
TOWEL OR 17X26 10 PK STRL BLUE (TOWEL DISPOSABLE) ×1 IMPLANT
UNDERPAD 30X36 HEAVY ABSORB (UNDERPADS AND DIAPERS) ×1 IMPLANT
YANKAUER SUCT BULB TIP 10FT TU (MISCELLANEOUS) IMPLANT

## 2022-11-26 NOTE — Anesthesia Procedure Notes (Signed)
Procedure Name: LMA Insertion Date/Time: 11/26/2022 11:46 AM  Performed by: Sindy Guadeloupe, CRNAPre-anesthesia Checklist: Patient identified, Emergency Drugs available, Suction available, Patient being monitored and Timeout performed Patient Re-evaluated:Patient Re-evaluated prior to induction Oxygen Delivery Method: Circle system utilized Preoxygenation: Pre-oxygenation with 100% oxygen Induction Type: IV induction Ventilation: Mask ventilation without difficulty LMA: LMA inserted LMA Size: 4.0 Number of attempts: 1 Tube secured with: Tape Dental Injury: Teeth and Oropharynx as per pre-operative assessment

## 2022-11-26 NOTE — H&P (Signed)
PREOPERATIVE H&P  Chief Complaint: Right leg pain with late ulceration and wound  HPI: [name] is a 67 y.o. female who presents for preoperative history and physical with a diagnosis of chronic wound to the lateral aspect of her leg.  She is here today for surgery for debridement and wound VAC placement.  Will also plan to send culture material and assess for deep infection.  Patient did have an ankle fracture several months ago and healed the wound uneventfully but then had late opening up of the wound.  She had a recent angiogram done by vascular surgery.  She has been doing home wound care without much improvement in her wound.. Symptoms are rated as moderate to severe, and have been worsening.  This is significantly impairing activities of daily living.  She has elected for surgical management.   Past Medical History:  Diagnosis Date   ADHD (attention deficit hyperactivity disorder)    Anemia    Anxiety    ARDS (adult respiratory distress syndrome) (HCC) 2007   Arthritis    Depression    Gallstones 01/07/2018   Ganglion cyst of dorsum of right wrist    Hepatitis    2004 non detectable now   MRSA (methicillin resistant Staphylococcus aureus) 2007   Osteoporosis    Pneumonia 2009   Prosthetic eye globe    right eye    Raynaud's disease    Past Surgical History:  Procedure Laterality Date   ABDOMINAL AORTOGRAM W/LOWER EXTREMITY N/A 11/19/2022   Procedure: ABDOMINAL AORTOGRAM W/LOWER EXTREMITY;  Surgeon: Cephus Shelling, MD;  Location: [facility];  Service: Cardiovascular;  Laterality: N/A;   ABDOMINAL HYSTERECTOMY     ANTERIOR CERVICAL DECOMP/DISCECTOMY FUSION N/A 12/12/2014   Procedure: ANTERIOR CERVICAL DECOMPRESSION/DISCECTOMY FUSION 3 LEVELS;  Surgeon: Estill Bamberg, MD;  Location: [facility] OR;  Service: Orthopedics;  Laterality: N/A;  Anterior cervical decompression fusion, cerivcal 5-6, cervical 6-7, cervical 7-thoracic 1 with instrumentation and  allografrt   APLIGRAFT PLACEMENT Right 04/16/2022   Procedure: APPLICATION OF SKIN SUBSTITUTE GRAFT;  Surgeon: Terance Hart, MD;  Location: [facility] OR;  Service: Orthopedics;  Laterality: Right;   APPENDECTOMY  1974   APPLICATION OF WOUND VAC Left 07/22/2022   Procedure: APPLICATION OF WOUND VAC AND MYRIAD;  Surgeon: Peggye Form, DO;  Location: [facility] OR;  Service: Plastics;  Laterality: Left;   CATARACT EXTRACTION Left 11/2018   CHOLECYSTECTOMY N/A 01/07/2018   Procedure: LAPAROSCOPIC CHOLECYSTECTOMY WITH INTRAOPERATIVE CHOLANGIOGRAM;  Surgeon: Claud Kelp, MD;  Location: [facility] OR;  Service: General;  Laterality: N/A;   COLONOSCOPY  04/14/2013   per Dr. Christella Hartigan, clear, repeat in 10 yrs    CORNEAL TRANSPLANT Right 2007   x4   ENUCLEATION Right 2013   eye infection   GANGLION CYST EXCISION Right 05/25/2016   Procedure: RIGHT WRIST ULNAR GANGLION EXCISION;  Surgeon: Mack Hook, MD;  Location: [facility];  Service: Orthopedics;  Laterality: Right;   HARDWARE REMOVAL Right 08/20/2022   Procedure: HARDWARE REMOVAL;  Surgeon: Terance Hart, MD;  Location: [facility] OR;  Service: Orthopedics;  Laterality: Right;   HEMATOMA EVACUATION Left 07/22/2022   Procedure: EVACUATION HEMATOMA LEFT BACK WITH EXCISION OF WOUND;  Surgeon: Peggye Form, DO;  Location: [facility];  Service: Plastics;  Laterality: Left;   I & D EXTREMITY Right 05/28/2022   Procedure: right ankle wound debridement with myriad placement;  Surgeon: Peggye Form, DO;  Location: [facility];  Service: Plastics;  Laterality: Right;   I & D EXTREMITY Right 08/20/2022   Procedure: IRRIGATION AND DEBRIDEMENT EXTREMITY WITH WOUND VAC PLACEMENT;  Surgeon: Terance Hart, MD;  Location: [facility] OR;  Service: Orthopedics;  Laterality: Right;   INCISION AND DRAINAGE Right 07/22/2022   Procedure: INCISION AND DRAINAGE RIGHT ANKLE;  Surgeon: Peggye Form, DO;  Location: [facility];  Service:  Plastics;  Laterality: Right;   MINOR HARDWARE REMOVAL Right 04/16/2022   Procedure: REPAIR OF SURGICAL WOUND DEHISCENCE RIGHT ANKLE, REMOVAL OF DEEP ORTHOPEDIC HARDWARE;  Surgeon: Terance Hart, MD;  Location: [facility] OR;  Service: Orthopedics;  Laterality: Right;   ORIF CLAVICULAR FRACTURE Left 02/03/2022   Procedure: OPEN REDUCTION INTERNAL FIXATION (ORIF) LEFT CLAVICULAR FRACTURE WITH CORACOLAVICULAR FIXATION;  Surgeon: Jones Broom, MD;  Location: [facility];  Service: Orthopedics;  Laterality: Left;  BIOMET ZIP LOOP WITH CLAVICLE PLATES   ORIF FIBULA FRACTURE Right 02/03/2022   Procedure: OPEN TREATMENT OF RIGHT PILON ANKLE FRACTURE INCLUDING LATERAL MALLEOLUS FRACTURE;  Surgeon: Terance Hart, MD;  Location: [facility] OR;  Service: Orthopedics;  Laterality: Right;  LENGTH OF SURGERY: 120 MINUTES   REVERSE SHOULDER ARTHROPLASTY Right 07/10/2021   Procedure: REVERSE SHOULDER ARTHROPLASTY;  Surgeon: Jones Broom, MD;  Location: [facility];  Service: Orthopedics;  Laterality: Right;   RHINOPLASTY  1980   ROBOTIC ASSISTED LAPAROSCOPIC SACROCOLPOPEXY Bilateral 06/13/2020   Procedure: XI ROBOTIC ASSISTED LAPAROSCOPIC SACROCOLPOPEXY AND SUPRACERVICAL HYSTERECTOMY AND BILATERAL  SALPINGO OOPHERECTOMY;  Surgeon: Crist Fat, MD;  Location: [facility];  Service: Urology;  Laterality: Bilateral;   SYNDESMOSIS REPAIR Right 02/03/2022   Procedure: POSSIBLE OPEN TREATMENT RIGHT SYNDESMOSIS;  Surgeon: Terance Hart, MD;  Location: [facility] OR;  Service: Orthopedics;  Laterality: Right;   TONSILLECTOMY     removed in 3rd grade   TOTAL SHOULDER ARTHROPLASTY Left 03/13/2019   Procedure: TOTAL SHOULDER ARTHROPLASTY;  Surgeon: Jones Broom, MD;  Location: [facility];  Service: Orthopedics;  Laterality: Left;   Social History   Socioeconomic History   Marital status: Single    Spouse name: Not on file   Number of children: Not on file   Years of education: Not on file   Highest education  level: Bachelor's degree (e.g., BA, AB, BS)  Occupational History   Not on file  Tobacco Use   Smoking status: Never   Smokeless tobacco: Never  Vaping Use   Vaping status: Never Used  Substance and Sexual Activity   Alcohol use: Not Currently    Comment: occ   Drug use: No   Sexual activity: Never    Birth control/protection: Post-menopausal  Other Topics Concern   Not on file  Social History Narrative   Not on file   Social Determinants of Health   Financial Resource Strain: Low Risk  (06/08/2022)   Received from [facility]   Overall Financial Resource Strain (CARDIA)    Difficulty of Paying Living Expenses: Not very hard  Food Insecurity: Unknown (07/22/2022)   Hunger Vital Sign    Worried About Running Out of Food in the Last Year: Never true    Ran Out of Food in the Last Year: Not on file  Transportation Needs: Unknown (07/22/2022)   PRAPARE - Transportation    Lack of Transportation (Medical): No    Lack of Transportation (Non-Medical): Not on file  Physical Activity: Sufficiently Active (08/14/2021)   Exercise Vital Sign    Days of Exercise per Week: 4 days  Minutes of Exercise per Session: 90 min  Stress: No Stress Concern Present (08/14/2021)   [employer] of Occupational Health - Occupational Stress Questionnaire    Feeling of Stress : Not at all  Social Connections: Unknown (03/25/2022)   Received from [facility]   Social Network    Social Network: Not on file   Family History  Problem Relation Age of Onset   Breast cancer Maternal Aunt 60   Colon cancer Neg Hx    No Known Allergies Prior to Admission medications   Medication Sig Start Date End Date Taking? Authorizing Provider  amphetamine-dextroamphetamine (ADDERALL XR) 20 MG 24 hr capsule Take 1 capsule (20 mg total) by mouth every morning. 10/05/22   Nelwyn Salisbury, MD  BACTRIM DS 800-160 MG tablet Take 1 tablet by mouth 2 (two) times daily. 11/09/22    [provider]  celecoxib (CELEBREX) 200 MG capsule Take 1 capsule (200 mg total) by mouth 2 (two) times daily. 11/05/22   Nelwyn Salisbury, MD  cetirizine (ZYRTEC) 10 MG tablet Take 10 mg by mouth daily.    [provider]  cyanocobalamin 1000 MCG tablet Take 1 tablet (1,000 mcg total) by mouth daily. 08/23/22   Marguerita Merles Latif, DO  cycloSPORINE (RESTASIS) 0.05 % ophthalmic emulsion Place 1 drop into both eyes 2 times daily. Patient taking differently: Place 2 drops into both eyes 2 (two) times daily as needed (Dry eyes). 06/25/21     estradiol (ESTRACE VAGINAL) 0.1 MG/GM vaginal cream Apply a fingertip amount to urethra 2 times per week. Patient taking differently: Place 1 Applicatorful vaginally once a week. 09/30/21     Multiple Vitamins-Minerals (MULTIVITAMIN WITH MINERALS) tablet Take 1 tablet by mouth daily. With folic acid    [provider]  nystatin (MYCOSTATIN) 100000 UNIT/ML suspension Take 5 mLs (500,000 Units total) by mouth 4 (four) times daily. Patient taking differently: Take 5 mLs by mouth daily as needed (thrush). 05/27/22   Nelwyn Salisbury, MD  ondansetron (ZOFRAN) 4 MG tablet Take 1 tablet (4 mg total) by mouth every 6 (six) hours as needed for nausea. 08/22/22   Marguerita Merles Latif, DO  Oxycodone HCl 20 MG TABS Take 1 tablet (20 mg total) by mouth every 6 (six) hours as needed (pain). Patient taking differently: Take 20 mg by mouth 3 (three) times daily as needed (pain). 10/02/22   Nelwyn Salisbury, MD  polyethylene glycol powder (GLYCOLAX/MIRALAX) powder TAKE 17 GRAMS BY MOUTH 2 TIMES DAILY AS NEEDED. Patient taking differently: Take 17 g by mouth as needed for moderate constipation. 02/02/17   Nelwyn Salisbury, MD  pregabalin (LYRICA) 300 MG capsule Take 1 capsule (300 mg total) by mouth 2 (two) times daily. 08/07/22   Nelwyn Salisbury, MD  senna-docusate (SENOKOT-S) 8.6-50 MG tablet Take 1 tablet by mouth 2 (two) times daily. 08/22/22   Marguerita Merles Latif, DO   valACYclovir (VALTREX) 1000 MG tablet Take 1 tablet (1,000 mg total) by mouth 3 (three) times daily. Patient taking differently: Take 1,000 mg by mouth daily. 08/03/22   Nelwyn Salisbury, MD  verapamil (CALAN-SR) 120 MG CR tablet Take 1 tablet (120 mg total) by mouth at bedtime. 04/27/22   Nelwyn Salisbury, MD     Positive ROS: All other systems have been reviewed and were otherwise negative with the exception of those mentioned in the HPI and as above.  Physical Exam:  There were no vitals filed for this visit. General: Alert,  no acute distress Cardiovascular: No pedal edema Respiratory: No cyanosis, no use of accessory musculature GI: No organomegaly, abdomen is soft and non-tender Skin: No lesions in the area of chief complaint Neurologic: Sensation intact distally Psychiatric: Patient is competent for consent with normal mood and affect Lymphatic: No axillary or cervical lymphadenopathy  MUSCULOSKELETAL: Right leg demonstrates chronic wound to the lateral aspect of the leg.  No purulent drainage noted.  Some fibrinous exudate with exposed peroneal musculature and tendons.  Assessment: Right chronic leg wound with exposed peroneal musculature and tendon with possible deep abscess   Plan: Plan for wound exploration and debridement with possible skin graft substitute placement versus wound VAC placement versus both.  She may have a deep abscess.  She will need debridement of her peroneal tendons as well.  Will likely plan to keep her in the hospital for monitoring and antibiotics..  We discussed the risks, benefits and alternatives of surgery which include but are not limited to wound healing complications, infection, nonunion, malunion, need for further surgery, damage to surrounding structures and continued pain.  They understand there is no guarantees to an acceptable outcome.  After weighing these risks they opted to proceed with surgery.     Terance Hart,  MD    11/26/2022 9:05 AM

## 2022-11-26 NOTE — Op Note (Signed)
[name] [facility] female 67 y.o. 11/26/2022  PreOperative Diagnosis: Chronic nonhealing right leg wound 2 cm x 3 cm Distal ankle surgical wound dehiscence 3 cm Right distal leg deep abscess Right fibular chronic osteomyelitis   PostOperative Diagnosis: Same  PROCEDURE: Excisional debridement of nonhealing right leg wound 2 cm x 3 cm Repair of distal ankle surgical wound dehiscence, 3 cm Incision and drainage of right distal leg complicated deep abscess Right fibula saucerization Application of skin graft substitute Wound VAC placement  SURGEON: Dub Mikes, MD  ASSISTANT: Jesse [place], PA-C was necessary for patient positioning, prep, drape, assistance with wound debridement and application of wound VAC   ANESTHESIA: General  FINDINGS: See below  IMPLANTS: Integra micromatrix dermal matrix powder and sheet  INDICATIONS:67 y.o. female sustained an ankle fracture several months ago and had uneventful wound healing.  She then developed late distal wound dehiscence and multiple irrigation and debridement procedures with wound closure procedures with chronic wound.  She had hardware removed previously and continued to have wound healing issues.  She then developed a chronic wound along the lateral aspect of the leg with exposed peroneal musculature and tendon within the wound that did not heal well.  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   Patient understood the risks, benefits and alternatives to surgery which include but are not limited to wound healing complications, infection, nonunion, malunion, need for further surgery as well as damage to surrounding structures. They also understood the potential for continued pain in that there were no guarantees of acceptable outcome After weighing these risks the patient opted to  proceed with surgery.  PROCEDURE: Patient was identified in the preoperative holding area.  The right leg was marked by myself.  Consent was signed by myself and the patient.  Patient was taken to the operative suite and placed supine on the operative table.  General anesthesia was induced without difficulty. Bump was placed under the operative hip and bone foam was used.  All bony prominences were well padded.  Tourniquet was placed on the operative thigh.  Preoperative antibiotics were given. The extremity was prepped and draped in the usual sterile fashion and surgical timeout was performed.   We began by inspecting the wound.  There is a dehiscence of the surgical wound distal that was about 3 cm in length.  There is exposed peroneal tendon and fibular bone in this area.  There was a chronic wound along the more proximal aspect of the leg with exposed peroneal musculature and tendon within the wound.  There was rolled and skin edges.  There was also evidence of cyst fullness and fluctuance along the posterior lateral aspect of the distal leg consistent with abscess.  We began with assessing the distal wound.  Using 15 blade the surgical wound dehiscence area was elevated from the underlying fibula bone.  There is significant adhesions noted.  The skin edges were rolled under.  There was no obvious infection there but tissue and bone was sent for culture of this area.  There was evidence of exposed peroneal tendons that were removed from the surgical dehiscence site.  Then the skin edges were freshened with a 15 blade.  There was concern for chronic osteomyelitis of the fibula in this area and therefore fibular saucerization was performed distally.  A cortical window was created in some material was taken from  the fibula and this area was sent for culture.  Any devitalized bone was removed and discarded.  And it was noted that there was some fluctuance along the posterior lateral aspect of the ankle.   Through direct palpation there was some expression of what appeared to be a milky serous type fluid possibly representing pus.  A separate deep incision was made to incise and drain the abscess cavity.  Dissection scissors were placed within the abscess cavity and multiple areas were deloculated.  Then the area was irrigated with normal saline copiously.  Any nonviable tissue was then removed from the area.  Tissue from this area was sent for culture.  We then turned attention to the proximal wound.  The wound underwent excisional debridement.  Debridement type: Excisional Debridement  Side: right  Body Location: leg   Tools used for debridement: scalpel, scissors, and rongeur  Pre-debridement Wound size (cm):   Length: 3cm        Width: 2cm     Depth: 1cm   Post-debridement Wound size (cm):   Length: 3.5cm        Width: 2.5cm     Depth: 2.5cm   Debridement depth beyond dead/damaged tissue down to healthy viable tissue: yes  Tissue layer involved: skin, subcutaneous tissue, muscle / fascia, bone  Nature of tissue removed: Slough, Necrotic, Devitalized Tissue, and Non-viable tissue  Irrigation volume: 3000cc     Irrigation fluid type: Normal Saline  We then proceeded with application of skin graft substitute.  Micro matrix powder and sheet was placed within both wounds.  The distal wound dehiscence area was done first.  Once the dermal matrix material and powder was placed then using a 2-0 nylon suture the area of wound dehiscence was closed primarily.  This was done in a tension-free manner and the skin edges closed well.  We then turned our attention to the more proximal chronic wound.  The matrix powder and matrix graft was placed within the wound and tucked under the skin edges.  The skin was not able to be closed and therefore decision was made to place a wound VAC.  There was good bleeding at the wound.  A wound VAC was then placed overlying the proximal chronic wound.  Prior to  placing the sponge a Adaptic dressing was placed overlying the skin graft substitute.  Then the sponge was placed in the flap reverse placed uneventfully.  There was good suction of the VAC after placement.  After VAC placement was placed 125 mmHg of continuous suction.  There was good seal.  The leg was cleaned.  Posterior slab splint was placed.  She is then awakened from anesthesia and taken recovery in stable condition.  No complications.     POST OPERATIVE INSTRUCTIONS: Nonweightbearing to operative extremity Keep splint in place Will change VAC on Monday Follow-up on cultures  TOURNIQUET TIME:none  BLOOD LOSS:  less than 50 mL         DRAINS: none         SPECIMEN: none       COMPLICATIONS:  * No complications entered in OR log *         Disposition: PACU - hemodynamically stable.         Condition: stable

## 2022-11-26 NOTE — Transfer of Care (Signed)
Immediate Anesthesia Transfer of Care Note  Patient: [name]  Procedure(s) Performed: REPAIR OF SURGICAL WOUND DEHISCENCE RIGHT ANKLE, TREATMENT OF RIGHT LOWER LEG ABSCESS, PERONEAL TENDON DEBRIDEMENT, APPLICATION OF SKIN SUBSTITUTE GRAFT, APPLICATION OF WOUND VAC (Right: Ankle)  Patient Location: PACU  Anesthesia Type:General  Level of Consciousness: awake, drowsy, and patient cooperative  Airway & Oxygen Therapy: Patient Spontanous Breathing and Patient connected to face mask oxygen  Post-op Assessment: Report given to RN and Post -op Vital signs reviewed and stable  Post vital signs: Reviewed and stable  Last Vitals:  Vitals Value Taken Time  BP 146/75 11/26/22 1310  Temp    Pulse 72 11/26/22 1314  Resp 22 11/26/22 1314  SpO2 100 % 11/26/22 1314  Vitals shown include unfiled device data.  Last Pain:  Vitals:   11/26/22 1035  TempSrc: Oral  PainSc:       Patients Stated Pain Goal: 4 (11/26/22 1028)  Complications: No notable events documented.

## 2022-11-26 NOTE — Anesthesia Preprocedure Evaluation (Addendum)
Anesthesia Evaluation  Patient identified by MRN, date of birth, ID band Patient awake    Reviewed: Allergy & Precautions, NPO status , Patient's Chart, lab work & pertinent test results  Airway Mallampati: II  TM Distance: >3 FB Neck ROM: Full    Dental no notable dental hx.    Pulmonary neg pulmonary ROS   Pulmonary exam normal        Cardiovascular hypertension, Pt. on medications + Peripheral Vascular Disease  Normal cardiovascular exam     Neuro/Psych  PSYCHIATRIC DISORDERS Anxiety Depression     Neuromuscular disease    GI/Hepatic negative GI ROS,,,(+) Hepatitis -  Endo/Other  negative endocrine ROS    Renal/GU negative Renal ROS     Musculoskeletal  (+) Arthritis ,    Abdominal   Peds  Hematology negative hematology ROS (+)   Anesthesia Other Findings RIGHT SURGICAL WOUND DEHISCENCE, RIGHT LOWER LEG ABSCESS  Reproductive/Obstetrics                             Anesthesia Physical Anesthesia Plan  ASA: 3  Anesthesia Plan: General   Post-op Pain Management:    Induction: Intravenous  PONV Risk Score and Plan: 3 and Ondansetron, Dexamethasone, Midazolam and Treatment may vary due to age or medical condition  Airway Management Planned: LMA  Additional Equipment:   Intra-op Plan:   Post-operative Plan: Extubation in OR  Informed Consent: I have reviewed the patients History and Physical, chart, labs and discussed the procedure including the risks, benefits and alternatives for the proposed anesthesia with the patient or authorized representative who has indicated his/her understanding and acceptance.     Dental advisory given  Plan Discussed with: CRNA  Anesthesia Plan Comments:        Anesthesia Quick Evaluation

## 2022-11-27 DIAGNOSIS — Z803 Family history of malignant neoplasm of breast: Secondary | ICD-10-CM | POA: Diagnosis not present

## 2022-11-27 DIAGNOSIS — Z947 Corneal transplant status: Secondary | ICD-10-CM | POA: Diagnosis not present

## 2022-11-27 DIAGNOSIS — F418 Other specified anxiety disorders: Secondary | ICD-10-CM | POA: Diagnosis not present

## 2022-11-27 DIAGNOSIS — Z90711 Acquired absence of uterus with remaining cervical stump: Secondary | ICD-10-CM | POA: Diagnosis not present

## 2022-11-27 DIAGNOSIS — S81801A Unspecified open wound, right lower leg, initial encounter: Secondary | ICD-10-CM | POA: Diagnosis not present

## 2022-11-27 DIAGNOSIS — Y848 Other medical procedures as the cause of abnormal reaction of the patient, or of later complication, without mention of misadventure at the time of the procedure: Secondary | ICD-10-CM | POA: Diagnosis present

## 2022-11-27 DIAGNOSIS — I10 Essential (primary) hypertension: Secondary | ICD-10-CM | POA: Diagnosis not present

## 2022-11-27 DIAGNOSIS — M869 Osteomyelitis, unspecified: Secondary | ICD-10-CM | POA: Diagnosis not present

## 2022-11-27 DIAGNOSIS — L02415 Cutaneous abscess of right lower limb: Secondary | ICD-10-CM | POA: Diagnosis not present

## 2022-11-27 DIAGNOSIS — M810 Age-related osteoporosis without current pathological fracture: Secondary | ICD-10-CM | POA: Diagnosis not present

## 2022-11-27 DIAGNOSIS — M86661 Other chronic osteomyelitis, right tibia and fibula: Secondary | ICD-10-CM | POA: Diagnosis not present

## 2022-11-27 DIAGNOSIS — I739 Peripheral vascular disease, unspecified: Secondary | ICD-10-CM | POA: Diagnosis not present

## 2022-11-27 DIAGNOSIS — T8189XA Other complications of procedures, not elsewhere classified, initial encounter: Secondary | ICD-10-CM | POA: Diagnosis not present

## 2022-11-27 DIAGNOSIS — T8131XA Disruption of external operation (surgical) wound, not elsewhere classified, initial encounter: Secondary | ICD-10-CM | POA: Diagnosis not present

## 2022-11-27 DIAGNOSIS — M79604 Pain in right leg: Secondary | ICD-10-CM | POA: Diagnosis present

## 2022-11-27 DIAGNOSIS — Z96611 Presence of right artificial shoulder joint: Secondary | ICD-10-CM | POA: Diagnosis not present

## 2022-11-27 DIAGNOSIS — Z8614 Personal history of Methicillin resistant Staphylococcus aureus infection: Secondary | ICD-10-CM | POA: Diagnosis not present

## 2022-11-27 DIAGNOSIS — Z96612 Presence of left artificial shoulder joint: Secondary | ICD-10-CM | POA: Diagnosis not present

## 2022-11-27 MED ORDER — VANCOMYCIN HCL 750 MG/150ML IV SOLN
750.0000 mg | INTRAVENOUS | Status: DC
  Administered 2022-11-28 – 2022-11-29 (×2): 750 mg via INTRAVENOUS
  Filled 2022-11-27 (×3): qty 150

## 2022-11-27 MED ORDER — SODIUM CHLORIDE 0.9 % IV SOLN
2.0000 g | Freq: Two times a day (BID) | INTRAVENOUS | Status: DC
  Administered 2022-11-27 – 2022-11-30 (×7): 2 g via INTRAVENOUS
  Filled 2022-11-27 (×7): qty 12.5

## 2022-11-27 MED ORDER — VANCOMYCIN HCL IN DEXTROSE 1-5 GM/200ML-% IV SOLN
1000.0000 mg | Freq: Once | INTRAVENOUS | Status: AC
  Administered 2022-11-27: 1000 mg via INTRAVENOUS
  Filled 2022-11-27: qty 200

## 2022-11-27 MED ORDER — ENOXAPARIN SODIUM 30 MG/0.3ML IJ SOSY
30.0000 mg | PREFILLED_SYRINGE | INTRAMUSCULAR | Status: DC
  Administered 2022-11-27 – 2022-11-30 (×4): 30 mg via SUBCUTANEOUS
  Filled 2022-11-27 (×4): qty 0.3

## 2022-11-27 MED ORDER — SODIUM CHLORIDE 0.9 % IV SOLN: INTRAVENOUS | Status: AC | PRN

## 2022-11-27 NOTE — Consult Note (Addendum)
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 Gpc present too and could be staph or strep  If after this treatment course complete and non-healing would need to look for non-tuberculous mycobacterial infection  ----------- Addendum Gnr also growing  Plan: F/u cultures. Anticipate availability by Sunday or monday Vancomycin for now Depending on the culture data, might or might not need picc  I have also scheduled her to see me earlier in clinic on 11/12 @ 2pm Discussed with primary team   ----------- Addendum Will add cefepime   ------------------------------------------------ Principal Problem:   Leg wound, right    HPI: [name] is a 67 y.o. female admitted for I&D of nonhealing right ankle wound, ID called for gpc and gpr growing from deep tissue culture  Discussed with patient and reviewed chart  Patient had orif 01/2022 for right ankle fx. This surgical site had dehiscence on 03/2022 and had partial hardware removal but no abx treatment. She had ongoing nonhealing wound and sign of infection so had full hardware removal 08/20/2022 and  underwent 6 weeks of iv abx for mssa.   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  Ultrasound-guided access left common femoral artery 2.  Aortogram with catheter selection of aorta 3.  Right lower extremity arteriogram with catheter selection of right external iliac artery 4.  Mynx closure of the left common femoral artery  Family History  Problem Relation Age of Onset   Breast cancer Maternal Aunt 60   Colon cancer Neg Hx     Social History   Tobacco Use   Smoking status: Never   Smokeless tobacco: Never  Vaping Use   Vaping status: Never Used  Substance Use Topics   Alcohol use: Not Currently    Comment: occ   Drug use: No    No Known Allergies  Review of Systems: ROS All Other ROS was negative, except mentioned above   Past Medical History:  Diagnosis Date   ADHD (attention deficit hyperactivity disorder)    Anemia    Anxiety    ARDS (adult respiratory distress syndrome) (HCC) 2007   Arthritis    Depression    Gallstones 01/07/2018   Ganglion cyst  of dorsum of right wrist    Hepatitis    2004 non detectable now   MRSA (methicillin resistant Staphylococcus aureus) 2007   Osteoporosis    Pneumonia 2009   Prosthetic eye globe    right eye    Raynaud's disease        Scheduled Meds:  cyanocobalamin  1,000 mcg Oral Daily   enoxaparin (LOVENOX) injection  30 mg Subcutaneous Q24H   loratadine  10 mg Oral Daily   multivitamin with minerals  1 tablet Oral Daily   pregabalin  300 mg Oral BID   senna  1 tablet Oral BID    valACYclovir  1,000 mg Oral Daily   verapamil  120 mg Oral QHS   Continuous Infusions: PRN Meds:.acetaminophen, cycloSPORINE, diphenhydrAMINE, HYDROmorphone (DILAUDID) injection, methocarbamol **OR** methocarbamol (ROBAXIN) injection, metoCLOPramide **OR** metoCLOPramide (REGLAN) injection, naLOXone (NARCAN)  injection, ondansetron **OR** ondansetron (ZOFRAN) IV, mouth rinse, oxyCODONE, oxyCODONE   OBJECTIVE: Blood pressure (!) 113/56, pulse (!) 55, temperature 98 F (36.7 C), temperature source Oral, resp. rate 18, height 5' 1.5" (1.562 m), weight 47.6 kg, SpO2 99%.  Physical Exam  General/constitutional: no distress, pleasant HEENT: Normocephalic, PER, Conj Clear, EOMI, Oropharynx clear Neck supple CV: rrr no mrg Lungs: clear to auscultation, normal respiratory effort Abd: Soft, Nontender Ext: no edema Skin: No Rash Neuro: nonfocal MSK: right ankle in cast/dressing with wound vac on   Lab Results Lab Results  Component Value Date   WBC 4.5 11/26/2022   HGB 13.3 11/26/2022   HCT 41.5 11/26/2022   MCV 95.0 11/26/2022   PLT 127 (L) 11/26/2022    Lab Results  Component Value Date   CREATININE 0.52 11/26/2022   BUN 13 11/24/2022   NA 135 11/24/2022   K 4.0 11/24/2022   CL 101 11/24/2022   CO2 27 11/24/2022    Lab Results  Component Value Date   ALT 23 11/24/2022   AST 31 11/24/2022   ALKPHOS 88 11/24/2022   BILITOT 0.6 11/24/2022      Microbiology: Recent Results (from the past 240 hour(s))  Aerobic/Anaerobic Culture w Gram Stain (surgical/deep wound)     Status: None (Preliminary result)   Collection Time: 11/26/22 12:01 PM   Specimen: Foot, Right; Wound  Result Value Ref Range Status   Specimen Description   Final    TISSUE RIGHT FOOT Performed at [facility], [address], [place], [place] [address]    Special Requests   Final    NONE Performed at [facility], [address], [place], [place] [address]    Gram Stain    Final    NO WBC SEEN RARE GRAM POSITIVE COCCI IN PAIRS RARE GRAM POSITIVE RODS    Culture   Final    CULTURE REINCUBATED FOR BETTER GROWTH Performed at [facility], [address] [place], [place] [address]    Report Status PENDING  Incomplete     Serology:    Imaging: If present, new imagings (plain films, ct scans, and mri) have been personally visualized and interpreted; radiology reports have been reviewed. Decision making incorporated into the Impression / Recommendations.    08/14/22 ct right foot 1. Lateral soft tissue swelling at the ankle with soft tissue ulceration peripheral to the lateral malleolus, suspicious for soft tissue infection. No focal fluid collection, soft tissue emphysema or unexpected foreign body identified. 2. Focal osteolysis of the adjacent lateral malleolus with cortical destruction, suspicious for osteomyelitis. 3. Previously demonstrated lateral fibular  plate and screws have been removed. Incomplete osseous healing of the oblique fracture of the distal fibula, suspicious for nonunion. 4. No evidence of osteomyelitis within the foot. Correlate with additional areas of concern. 5. The peroneal tendons are poorly defined at the level of the distal fibula and appear laterally subluxed from the retromalleolar groove. Cannot confirm tendon integrity.      Raymondo Band, MD [facility] [phone] pager    11/27/2022, 12:22 PM

## 2022-11-27 NOTE — Progress Notes (Signed)
Pharmacy Antibiotic Note  [name] is a 67 y.o. female admitted on 11/26/2022 with persistent R-ankle wound and concern for osteo. Pharmacy has been consulted for Vancomycin + Cefepime dosing.  The patient underwent I&D with skin graft on 10/31. Gram stain showing GPC/GPR, culture currently growing GNR. Hx prior MSSA/strep/corynebacterium on prior cultures.  Plan: - Start Vancomycin 1g IV x 1 dose now followed by 750 mg IV every 24 hours (eAUC 434, VT~10) - Start Cefepime 2g IV every 12 hours - Will continue to follow renal function, culture results, LOT, and antibiotic de-escalation plans   Height: 5' 1.5" (156.2 cm) Weight: 47.6 kg (105 lb) IBW/kg (Calculated) : 48.95  Temp (24hrs), Avg:97.7 F (36.5 C), Min:97.2 F (36.2 C), Max:98.1 F (36.7 C)  Recent Labs  Lab 11/24/22 1509 11/26/22 1445 11/26/22 1636  WBC 5.5  --  4.5  CREATININE 0.56 0.52  --     Estimated Creatinine Clearance: 52.7 mL/min (by C-G formula based on SCr of 0.52 mg/dL).    No Known Allergies  Antimicrobials this admission: Cefazolin pre/post-op Vancomycin 11/1 >> Cefepime 11/1 >>  Dose adjustments this admission:   Microbiology results: 10/31 R-foot cx >> gram stain w/ GPC/GPR >> GNR growing on culture  Thank you for allowing pharmacy to be a part of this patient's care.  Georgina Pillion, PharmD, BCPS, BCIDP Infectious Diseases Clinical Pharmacist 11/27/2022 1:10 PM   **Pharmacist phone directory can now be found on [email or web address] ([email or web address]).  Listed under [facility].

## 2022-11-27 NOTE — TOC Initial Note (Signed)
Transition of Care [facility]) - Initial/Assessment Note   Patient Details  Name: [name] MRN: [id number] Date of Birth: 1955/09/27  [facility]) CM/SW Contact:    Ewing Schlein, LCSW Phone Number: 11/27/2022, 2:54 PM  Clinical Narrative: PT evaluation recommended a youth rolling walker. Patient agreeable to DME referral being made to [employer]. CSW made referral to Zack with [employer]. Per Timothy Lasso, [employer] can deliver the youth rolling walker to patient's room once the patient is 48 hours or less from discharge. TOC to follow.  Expected Discharge Plan: Home/Self Care Barriers to Discharge: Continued Medical Work up  Patient Goals and CMS Choice Patient states their goals for this hospitalization and ongoing recovery are:: Get youth rolling walker CMS Medicare.gov Compare Post Acute Care list provided to:: Patient Choice offered to / list presented to : Patient  Expected Discharge Plan and Services In-house Referral: Clinical Social Work Post Acute Care Choice: Durable Medical Equipment Living arrangements for the past 2 months: Single Family Home            DME Arranged: Environmental consultant youth DME Agency: [employer] Date DME Agency Contacted: 11/27/22 Time DME Agency Contacted: [phone] Representative spoke with at DME Agency: Zack  Prior Living Arrangements/Services Living arrangements for the past 2 months: Single Family Home Lives with:: Parents Patient language and need for interpreter reviewed:: Yes Do you feel safe going back to the place where you live?: Yes      Need for Family Participation in Patient Care: No (Comment) Care giver support system in place?: Yes (comment) Criminal Activity/Legal Involvement Pertinent to Current Situation/Hospitalization: No - Comment as needed  Activities of Daily Living ADL Screening (condition at time of admission) Independently performs ADLs?: Yes (appropriate for developmental age) Is the patient deaf or have difficulty hearing?:  No Does the patient have difficulty seeing, even when wearing glasses/contacts?: No Does the patient have difficulty concentrating, remembering, or making decisions?: No  Permission Sought/Granted Permission sought to share information with : Other (comment) Permission granted to share information with : Yes, Verbal Permission Granted Permission granted to share info w AGENCY: [employer]  Emotional Assessment Attitude/Demeanor/Rapport: Engaged Affect (typically observed): Accepting Orientation: : Oriented to Self, Oriented to Place, Oriented to  Time, Oriented to Situation Alcohol / Substance Use: Not Applicable Psych Involvement: No (comment)  Admission diagnosis:  Leg wound, right [S81.801A] Patient Active Problem List   Diagnosis Date Noted   Leg wound, right 11/26/2022   PAD (peripheral artery disease) (HCC) 11/17/2022   PICC (peripherally inserted central catheter) in place 09/29/2022   Medication monitoring encounter 09/29/2022   Cellulitis of right ankle 08/15/2022   Open wound of lower back and pelvis w/o penentrat into retroperitoneum, subsequent encounter 08/14/2022   Osteomyelitis of right ankle (HCC) 08/14/2022   HTN (hypertension) 08/14/2022   Pelvic fracture (HCC) 07/23/2022   Hematoma and contusion 07/22/2022   Right ankle pain 04/16/2022   OA (osteoarthritis) 02/23/2022   Closed right ankle fracture 02/03/2022   Prolapse of female pelvic organs 06/13/2020   Bladder prolapse, female, acquired 02/02/2020   Raynaud's disease without gangrene 02/10/2019   Acute pain of left knee 03/23/2018   Gallstones 01/07/2018   ADHD (attention deficit hyperactivity disorder), inattentive type 11/24/2016   Onychomycosis due to dermatophyte 08/23/2015   Liver fibrosis 04/02/2015   Radiculopathy 12/12/2014   Other specified anemias 10/11/2014   Substance abuse in remission (HCC) 08/28/2014   Chronic hepatitis C without hepatic coma (HCC) 07/06/2014   MRSA PNEUMONIA 10/01/2008  MUSCULOSKELETAL PAIN 09/27/2008   ALLERGIC RHINITIS 04/12/2008   CELLULITIS AND ABSCESS OF FACE 04/12/2008   PCP:  Nelwyn Salisbury, MD Pharmacy:   [facility] [address] [place], [place] - [address] [facility] [place]. [place] [address] Phone: [phone] Fax: [phone]  Social Determinants of Health (SDOH) Social History: SDOH Screenings   Food Insecurity: No Food Insecurity (11/26/2022)  Housing: Low Risk  (11/26/2022)  Transportation Needs: No Transportation Needs (11/26/2022)  Utilities: Not At Risk (11/26/2022)  Alcohol Screen: Low Risk  (08/14/2021)  Depression (PHQ2-9): Low Risk  (09/29/2022)  Financial Resource Strain: Low Risk  (06/08/2022)   [facility] from [facility]  Physical Activity: Sufficiently Active (08/14/2021)  Social Connections: Unknown (03/25/2022)   [facility] from [facility]  Stress: No Stress Concern Present (08/14/2021)  Tobacco Use: Low Risk  (11/26/2022)   SDOH Interventions:    Readmission Risk Interventions     No data to display

## 2022-11-27 NOTE — Progress Notes (Signed)
     Kenidy Crossland is a 67 y.o. female   Orthopaedic diagnosis: Right chronic nonhealing leg wound  Surgery: Debridement and repair of nonhealing right leg wound, I&D right leg abscess, right fibula saucerization, application of skin graft substitute and wound VAC placement 11/26/2022  Subjective: Patient resting comfortably.  Pain controlled on current regimen.  Wound VAC in place with output noted.  Gram stain on operative specimen revealing for gram-positive cocci and rods. Cultures pending. No reports fevers or chills.   Objectyive: Vitals:   11/27/22 0147 11/27/22 0535  BP: 133/61 121/63  Pulse: (!) 58 65  Resp: 17 17  Temp: 98 F (36.7 C) 98.1 F (36.7 C)  SpO2: 97% 97%     Exam: Awake and alert Respirations even and unlabored No acute distress  Right lower extremity demonstrates short leg splint and wound VAC in place.  Exposed skin is benign.  He is able to wiggle the toes.  Intact sensation light touch in the toes.  Warm and well-perfused distally.  Assessment: Postop day 1 status post above, doing well   Plan: Keep wound VAC in place.  Monitor output.  Empty as needed Toe touchdown weightbearing right lower extremity with walker. Pain control as needed Lovenox for DVT prophylaxis. She may work with therapies.  Complete postoperative antibiotics for now.  We will consult infectious disease for evaluation of continuing antibiotic therapy. We will plan to change her wound VAC and assess the wound at bedside on Monday. She will likely go home with a wound vac.    Domingo Fuson J. [place], PA-C

## 2022-11-27 NOTE — Plan of Care (Signed)
  Problem: Health Behavior/Discharge Planning: Goal: Ability to manage health-related needs will improve Outcome: Progressing   Problem: Clinical Measurements: Goal: Ability to maintain clinical measurements within normal limits will improve Outcome: Progressing Goal: Will remain free from infection Outcome: Progressing Goal: Diagnostic test results will improve Outcome: Progressing Goal: Respiratory complications will improve Outcome: Progressing Goal: Cardiovascular complication will be avoided Outcome: Progressing   Problem: Activity: Goal: Risk for activity intolerance will decrease Outcome: Adequate for Discharge   Problem: Coping: Goal: Level of anxiety will decrease Outcome: Progressing   Problem: Elimination: Goal: Will not experience complications related to bowel motility Outcome: Progressing Goal: Will not experience complications related to urinary retention Outcome: Completed/Met   Problem: Pain Management: Goal: General experience of comfort will improve Outcome: Progressing   Problem: Safety: Goal: Ability to remain free from injury will improve Outcome: Progressing   Problem: Skin Integrity: Goal: Risk for impaired skin integrity will decrease Outcome: Progressing

## 2022-11-27 NOTE — Evaluation (Signed)
Occupational Therapy Evaluation Patient Details Name: [name] MRN: [id number] DOB: 1955/05/26 Today's Date: 11/27/2022   History of Present Illness Pt s/p I and D of R leg deep abscess, repair of R distal ankle wound dehiscence, and wound vac placement on 11-26-22.  Pt with hx of ADHD, R ankle fx and cervical fusion, arthritis, prosthetic R eye, Raynaud's disease   Clinical Impression   Pt performed tasks with SBA to CGA, including lower body dressing, sit to stand using a RW, toileting at bathroom level, and grooming in standing at the sink. She presented with good understanding, recall and teach back abilities with regards to education and skills taught. She does not require further OT services. OT will sign off and recommend she return home at discharge.       If plan is discharge home, recommend the following: Assist for transportation;Assistance with cooking/housework    Functional Status Assessment  Patient has not had a recent decline in their functional status  Equipment Recommendations  Tub/shower bench    Recommendations for Other Services       Precautions / Restrictions Precautions Precautions: Fall Restrictions Weight Bearing Restrictions: Yes RLE Weight Bearing: Touchdown weight bearing  Wound vac     Mobility Bed Mobility    General bed mobility comments: Pt up in chair and returns to same    Transfers Overall transfer level: Needs assistance Equipment used: Rolling walker (2 wheels)   Sit to Stand: Supervision                  Balance     Sitting balance-Leahy Scale: Good       Standing balance-Leahy Scale: Fair             ADL either performed or assessed with clinical judgement   ADL Overall ADL's : Needs assistance/impaired Eating/Feeding: Independent;Sitting Eating/Feeding Details (indicate cue type and reason): based on clinical judgement             Upper Body Dressing : Set up;Sitting Upper Body  Dressing Details (indicate cue type and reason): She donned a hospital gown around her back seated in the chair. Lower Body Dressing: Set up Lower Body Dressing Details (indicate cue type and reason): She donned her L shoe seated in the bedside chair. Toilet Transfer: Contact guard assist;Supervision/safety;Rolling walker (2 wheels);Grab bars;Ambulation Toilet Transfer Details (indicate cue type and reason): at bathroom level Toileting- Clothing Manipulation and Hygiene: Supervision/safety;Contact guard assist;Sit to/from stand Toileting - Clothing Manipulation Details (indicate cue type and reason): She performed all toileting tasks at bathroom level.              Pertinent Vitals/Pain Pain Assessment Pain Assessment: 0-10 Pain Score: 3  Pain Location: R LE increased with dependency Pain Intervention(s): Limited activity within patient's tolerance, Monitored during session     Extremity/Trunk Assessment Upper Extremity Assessment Upper Extremity Assessment: Overall WFL for tasks assessed;Right hand dominant        Communication Communication Communication: No apparent difficulties   Cognition Arousal: Alert Behavior During Therapy: WFL for tasks assessed/performed Overall Cognitive Status: Within Functional Limits for tasks assessed        General Comments               Home Living Family/patient expects to be discharged to:: Private residence Living Arrangements: Parent (75 yr old mother) Type of Home: House Home Access: Ramped entrance     Home Layout: One level     Bathroom Shower/Tub: Tub/shower unit  Bathroom Toilet: Standard     Home Equipment: Shower seat;Cane - single point;Rollator (4 wheels);Rolling Walker (2 wheels)   Additional Comments: knee scooter      Prior Functioning/Environment Prior Level of Function : Independent/Modified Independent             Mobility Comments: Independent with ambulation. ADLs Comments: Independent  with ADLs.              OT Treatment/Interventions:  (no further OT treatment needs identified)       OT Frequency:  (N/A)    Co-evaluation PT/OT/SLP Co-Evaluation/Treatment: Yes Reason for Co-Treatment: To address functional/ADL transfers;For patient/therapist safety PT goals addressed during session: Mobility/safety with mobility OT goals addressed during session: ADL's and self-care      AM-PAC OT "6 Clicks" Daily Activity     Outcome Measure Help from another person eating meals?: None Help from another person taking care of personal grooming?: None Help from another person toileting, which includes using toliet, bedpan, or urinal?: A Little Help from another person bathing (including washing, rinsing, drying)?: A Little Help from another person to put on and taking off regular upper body clothing?: None Help from another person to put on and taking off regular lower body clothing?: None 6 Click Score: 22   End of Session Equipment Utilized During Treatment: Gait belt;Rolling walker (2 wheels) Nurse Communication: Mobility status  Activity Tolerance: Patient tolerated treatment well Patient left: in chair;with call bell/phone within reach;with chair alarm set  OT Visit Diagnosis: Muscle weakness (generalized) (M62.81)                Time: 1130-1155 OT Time Calculation (min): 25 min Charges:  OT General Charges $OT Visit: 1 Visit OT Evaluation $OT Eval Moderate Complexity: 1 Mod    Alycea Segoviano L Tajah Schreiner, OTR/L 11/27/2022, 3:17 PM

## 2022-11-27 NOTE — Evaluation (Signed)
Physical Therapy Evaluation Patient Details Name: [name] MRN: [id number] DOB: 21-Jan-1956 Today's Date: 11/27/2022  History of Present Illness  Pt s/p I and D of R leg deep abscess and repair of R distal ankle wound dehiscence.  Pt with hx of ADHD, bil TSR, R ankle fx and cervical fusion.  Clinical Impression  Pt admitted as above and presenting with functional mobility limitations 2* decreased R LE strength/ROM, post op pain and TTWB on R.  Pt currently mobilizing well and should progress to dc home with family assist and no HHPT recommended at this point.      If plan is discharge home, recommend the following:     Can travel by private vehicle        Equipment Recommendations Rolling walker (2 wheels) (youth level RW please)  Recommendations for Other Services       Functional Status Assessment Patient has had a recent decline in their functional status and demonstrates the ability to make significant improvements in function in a reasonable and predictable amount of time.     Precautions / Restrictions Precautions Precautions: Fall Restrictions Weight Bearing Restrictions: Yes RLE Weight Bearing: Touchdown weight bearing      Mobility  Bed Mobility               General bed mobility comments: Pt up in chair and returns to same    Transfers Overall transfer level: Needs assistance Equipment used: Rolling walker (2 wheels) Transfers: Sit to/from Stand Sit to Stand: Supervision           General transfer comment: min cues for technique    Ambulation/Gait Ambulation/Gait assistance: Contact guard assist Gait Distance (Feet): 180 Feet Assistive device: Rolling walker (2 wheels) Gait Pattern/deviations: Step-to pattern, Decreased step length - right, Decreased step length - left, Shuffle, Trunk flexed Gait velocity: decr     General Gait Details: cues for sequence, posture and position from RW; good compliance with NWB  throughout  Stairs            Wheelchair Mobility     Tilt Bed    Modified Rankin (Stroke Patients Only)       Balance Overall balance assessment: Needs assistance Sitting-balance support: No upper extremity supported, Feet supported Sitting balance-Leahy Scale: Good     Standing balance support: Single extremity supported Standing balance-Leahy Scale: Fair                               Pertinent Vitals/Pain Pain Assessment Pain Assessment: 0-10 Pain Score: 3  Pain Location: R LE increased with dependency Pain Descriptors / Indicators: Aching, Sore Pain Intervention(s): Limited activity within patient's tolerance, Monitored during session, Premedicated before session    Home Living Family/patient expects to be discharged to:: Private residence Living Arrangements: Parent Available Help at Discharge: Friend(s) Type of Home: House Home Access: Ramped entrance       Home Layout: One level Home Equipment: Shower seat;Cane - single Librarian, academic (2 wheels);Rollator (4 wheels);Transport chair;BSC/3in1;Other (comment)      Prior Function Prior Level of Function : Needs assist;Independent/Modified Independent                     Extremity/Trunk Assessment   Upper Extremity Assessment Upper Extremity Assessment: Defer to OT evaluation    Lower Extremity Assessment Lower Extremity Assessment: RLE deficits/detail RLE Deficits / Details: Dressings in place    Cervical / Trunk Assessment  Cervical / Trunk Assessment: Normal  Communication   Communication Communication: No apparent difficulties  Cognition Arousal: Alert Behavior During Therapy: WFL for tasks assessed/performed Overall Cognitive Status: Within Functional Limits for tasks assessed                                          General Comments      Exercises     Assessment/Plan    PT Assessment Patient needs continued PT services  PT Problem  List Decreased strength;Decreased range of motion;Decreased activity tolerance;Decreased balance;Decreased mobility;Decreased knowledge of use of DME;Pain       PT Treatment Interventions DME instruction;Gait training;Functional mobility training;Therapeutic activities;Therapeutic exercise;Patient/family education    PT Goals (Current goals can be found in the Care Plan section)  Acute Rehab PT Goals Patient Stated Goal: rgain IND PT Goal Formulation: With patient Time For Goal Achievement: 12/04/22 Potential to Achieve Goals: Good    Frequency Min 1X/week     Co-evaluation PT/OT/SLP Co-Evaluation/Treatment: Yes Reason for Co-Treatment: To address functional/ADL transfers;For patient/therapist safety           AM-PAC PT "6 Clicks" Mobility  Outcome Measure Help needed turning from your back to your side while in a flat bed without using bedrails?: A Little Help needed moving from lying on your back to sitting on the side of a flat bed without using bedrails?: A Little Help needed moving to and from a bed to a chair (including a wheelchair)?: A Little Help needed standing up from a chair using your arms (e.g., wheelchair or bedside chair)?: A Little Help needed to walk in hospital room?: A Little Help needed climbing 3-5 steps with a railing? : A Lot 6 Click Score: 17    End of Session Equipment Utilized During Treatment: Gait belt Activity Tolerance: Patient tolerated treatment well Patient left: in chair;with call bell/phone within reach;with chair alarm set Nurse Communication: Mobility status PT Visit Diagnosis: Unsteadiness on feet (R26.81);Difficulty in walking, not elsewhere classified (R26.2)    Time: 1610-9604 PT Time Calculation (min) (ACUTE ONLY): 31 min   Charges:   PT Evaluation $PT Eval Low Complexity: 1 Low   PT General Charges $$ ACUTE PT VISIT: 1 Visit         Mauro Kaufmann PT Acute Rehabilitation Services Pager [phone] Office  [phone]   Nickol Collister 11/27/2022, 2:28 PM

## 2022-11-28 NOTE — Plan of Care (Signed)
  Problem: Health Behavior/Discharge Planning: Goal: Ability to manage health-related needs will improve Outcome: Progressing   Problem: Clinical Measurements: Goal: Ability to maintain clinical measurements within normal limits will improve Outcome: Progressing Goal: Will remain free from infection Outcome: Progressing Goal: Diagnostic test results will improve Outcome: Progressing Goal: Respiratory complications will improve Outcome: Progressing Goal: Cardiovascular complication will be avoided Outcome: Progressing   Problem: Activity: Goal: Risk for activity intolerance will decrease Outcome: Progressing   Problem: Coping: Goal: Level of anxiety will decrease Outcome: Progressing   Problem: Elimination: Goal: Will not experience complications related to bowel motility Outcome: Progressing   Problem: Pain Management: Goal: General experience of comfort will improve Outcome: Progressing   Problem: Safety: Goal: Ability to remain free from injury will improve Outcome: Progressing   Problem: Skin Integrity: Goal: Risk for impaired skin integrity will decrease Outcome: Progressing

## 2022-11-28 NOTE — Plan of Care (Signed)
  Problem: Coping: Goal: Level of anxiety will decrease 11/28/2022 0100 by Kizzie Bane, RN Outcome: Progressing 11/28/2022 0100 by Kizzie Bane, RN Outcome: Progressing 11/28/2022 0051 by Kizzie Bane, RN Outcome: Progressing   Problem: Pain Management: Goal: General experience of comfort will improve 11/28/2022 0100 by Kizzie Bane, RN Outcome: Progressing 11/28/2022 0100 by Kizzie Bane, RN Outcome: Progressing 11/28/2022 0051 by Kizzie Bane, RN Outcome: Progressing   Problem: Safety: Goal: Ability to remain free from injury will improve 11/28/2022 0100 by Kizzie Bane, RN Outcome: Progressing 11/28/2022 0100 by Kizzie Bane, RN Outcome: Progressing 11/28/2022 0051 by Kizzie Bane, RN Outcome: Progressing

## 2022-11-28 NOTE — Plan of Care (Signed)
  Problem: Coping: Goal: Level of anxiety will decrease Outcome: Progressing   Problem: Pain Management: Goal: General experience of comfort will improve Outcome: Progressing   Problem: Safety: Goal: Ability to remain free from injury will improve Outcome: Progressing

## 2022-11-28 NOTE — Progress Notes (Signed)
Physical Therapy Treatment Patient Details Name: [name] MRN: [id number] DOB: 05-18-1955 Today's Date: 11/28/2022   History of Present Illness Pt s/p I and D of R leg deep abscess and repair of R distal ankle wound dehiscence.  Pt with hx of ADHD, bil TSR, R ankle fx and cervical fusion.    PT Comments  Pt continues very cooperative and progressing well with mobility despite reports of increased pain.  Pt hopeful for dc home Monday.    If plan is discharge home, recommend the following:     Can travel by private vehicle        Equipment Recommendations  Rolling walker (2 wheels)    Recommendations for Other Services       Precautions / Restrictions Precautions Precautions: Fall Restrictions Weight Bearing Restrictions: Yes RLE Weight Bearing: Touchdown weight bearing     Mobility  Bed Mobility               General bed mobility comments: Pt up in chair and returns to same    Transfers Overall transfer level: Needs assistance Equipment used: Rolling walker (2 wheels) Transfers: Sit to/from Stand Sit to Stand: Supervision           General transfer comment: min cues for technique    Ambulation/Gait Ambulation/Gait assistance: Contact guard assist Gait Distance (Feet): 180 Feet Assistive device: Rolling walker (2 wheels) Gait Pattern/deviations: Step-to pattern, Decreased step length - right, Decreased step length - left, Shuffle, Trunk flexed Gait velocity: decr     General Gait Details: min cues for sequence, posture and position from RW; good compliance with NWB throughout   Stairs             Wheelchair Mobility     Tilt Bed    Modified Rankin (Stroke Patients Only)       Balance Overall balance assessment: Needs assistance Sitting-balance support: No upper extremity supported, Feet supported Sitting balance-Leahy Scale: Good     Standing balance support: Single extremity supported Standing balance-Leahy Scale:  Fair                              Cognition Arousal: Alert Behavior During Therapy: WFL for tasks assessed/performed Overall Cognitive Status: Within Functional Limits for tasks assessed                                          Exercises      General Comments        Pertinent Vitals/Pain Pain Assessment Pain Assessment: 0-10 Pain Score: 5  Pain Location: R LE increased with dependency Pain Descriptors / Indicators: Aching, Sore Pain Intervention(s): Limited activity within patient's tolerance, Premedicated before session, Monitored during session, Ice applied    Home Living                          Prior Function            PT Goals (current goals can now be found in the care plan section) Acute Rehab PT Goals Patient Stated Goal: regain IND PT Goal Formulation: With patient Time For Goal Achievement: 12/04/22 Potential to Achieve Goals: Good Progress towards PT goals: Progressing toward goals    Frequency    Min 1X/week      PT Plan  Co-evaluation              AM-PAC PT "6 Clicks" Mobility   Outcome Measure  Help needed turning from your back to your side while in a flat bed without using bedrails?: A Little Help needed moving from lying on your back to sitting on the side of a flat bed without using bedrails?: A Little Help needed moving to and from a bed to a chair (including a wheelchair)?: A Little Help needed standing up from a chair using your arms (e.g., wheelchair or bedside chair)?: A Little Help needed to walk in hospital room?: A Little Help needed climbing 3-5 steps with a railing? : A Lot 6 Click Score: 17    End of Session Equipment Utilized During Treatment: Gait belt Activity Tolerance: Patient tolerated treatment well Patient left: in chair;with call bell/phone within reach;with chair alarm set Nurse Communication: Mobility status PT Visit Diagnosis: Unsteadiness on feet  (R26.81);Difficulty in walking, not elsewhere classified (R26.2)     Time: 4098-1191 PT Time Calculation (min) (ACUTE ONLY): 28 min  Charges:    $Gait Training: 8-22 mins PT General Charges $$ ACUTE PT VISIT: 1 Visit                     Mauro Kaufmann PT Acute Rehabilitation Services Pager [phone] Office [phone]    Lafe Clerk 11/28/2022, 11:00 AM

## 2022-11-28 NOTE — Progress Notes (Signed)
Physical Therapy Treatment Patient Details Name: [name] MRN: [id number] DOB: [birth year] Today's Date: 11/28/2022   History of Present Illness Pt s/p I and D of R leg deep abscess and repair of R distal ankle wound dehiscence.  Pt with hx of ADHD, bil TSR, R ankle fx and cervical fusion.    PT Comments  Pt in good spirits and continues to progress well with mobility.  Pt hopeful for return home Monday.    If plan is discharge home, recommend the following:     Can travel by private vehicle        Equipment Recommendations  Rolling walker (2 wheels) (youth level RW please)    Recommendations for Other Services       Precautions / Restrictions Precautions Precautions: Fall Splint/Cast - Date Prophylactic Dressing Applied (if applicable): 11/26/22 Restrictions Weight Bearing Restrictions: Yes RLE Weight Bearing: Touchdown weight bearing     Mobility  Bed Mobility               General bed mobility comments: Pt up in chair and returns to same    Transfers Overall transfer level: Needs assistance Equipment used: Rolling walker (2 wheels) Transfers: Sit to/from Stand Sit to Stand: Supervision           General transfer comment: min cues for technique    Ambulation/Gait Ambulation/Gait assistance: Contact guard assist Gait Distance (Feet): 200 Feet Assistive device: Rolling walker (2 wheels) Gait Pattern/deviations: Step-to pattern, Decreased step length - right, Decreased step length - left, Shuffle, Trunk flexed Gait velocity: decr     General Gait Details: min cues for sequence, posture and position from RW; good compliance with NWB throughout   Stairs             Wheelchair Mobility     Tilt Bed    Modified Rankin (Stroke Patients Only)       Balance Overall balance assessment: Needs assistance Sitting-balance support: No upper extremity supported, Feet supported Sitting balance-Leahy Scale: Good     Standing  balance support: Single extremity supported Standing balance-Leahy Scale: Fair                              Cognition Arousal: Alert Behavior During Therapy: WFL for tasks assessed/performed Overall Cognitive Status: Within Functional Limits for tasks assessed                                          Exercises      General Comments        Pertinent Vitals/Pain Pain Assessment Pain Assessment: 0-10 Pain Score: 4  Pain Location: R LE increased with dependency Pain Descriptors / Indicators: Aching, Sore Pain Intervention(s): Limited activity within patient's tolerance, Monitored during session, Premedicated before session    Home Living                          Prior Function            PT Goals (current goals can now be found in the care plan section) Acute Rehab PT Goals Patient Stated Goal: regain IND PT Goal Formulation: With patient Time For Goal Achievement: 12/04/22 Potential to Achieve Goals: Good Progress towards PT goals: Progressing toward goals    Frequency    Min 1X/week  PT Plan      Co-evaluation              AM-PAC PT "6 Clicks" Mobility   Outcome Measure  Help needed turning from your back to your side while in a flat bed without using bedrails?: A Little Help needed moving from lying on your back to sitting on the side of a flat bed without using bedrails?: A Little Help needed moving to and from a bed to a chair (including a wheelchair)?: A Little Help needed standing up from a chair using your arms (e.g., wheelchair or bedside chair)?: A Little Help needed to walk in hospital room?: A Little Help needed climbing 3-5 steps with a railing? : A Lot 6 Click Score: 17    End of Session Equipment Utilized During Treatment: Gait belt Activity Tolerance: Patient tolerated treatment well Patient left: in chair;with call bell/phone within reach;with chair alarm set Nurse Communication: Mobility  status PT Visit Diagnosis: Unsteadiness on feet (R26.81);Difficulty in walking, not elsewhere classified (R26.2)     Time: 1525-1550 PT Time Calculation (min) (ACUTE ONLY): 25 min  Charges:    $Gait Training: 23-37 mins PT General Charges $$ ACUTE PT VISIT: 1 Visit                     Mauro Kaufmann PT Acute Rehabilitation Services Pager [phone] Office [phone]    [name] 11/28/2022, 4:14 PM

## 2022-11-28 NOTE — Progress Notes (Signed)
     [name] is a 67 y.o. 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  Pain controlled on current regimen.  Wound VAC in place with output noted.  ID on board. Cultures pending. No reports fevers or chills.    Objectyive: BP 132/81 (BP Location: Left Arm)   Pulse (!) 54   Temp 97.6 F (36.4 C) (Oral)   Resp 17   Ht 5' 1.5" (1.562 m)   Wt 47.6 kg   SpO2 94%   BMI 19.52 kg/m     Intake/Output Summary (Last 24 hours) at 11/28/2022 1319 Last data filed at 11/28/2022 0900 Gross per 24 hour  Intake 1678.63 ml  Output 50 ml  Net 1628.63 ml    Output by Drain (mL) 11/26/22 0701 - 11/26/22 1900 11/26/22 1901 - 11/27/22 0700 11/27/22 0701 - 11/27/22 1900 11/27/22 1901 - 11/28/22 0700 11/28/22 0701 - 11/28/22 1319  Negative Pressure Wound Therapy Ankle Right;Lateral  20  50 0    Exam: Awake and alert Respirations even and unlabored No acute distress   Right lower extremity demonstrates short leg splint and wound VAC in place.  Exposed skin is benign.  She is able to wiggle her toes.  Intact sensation light touch in the toes.  Warm and well-perfused distally.   Assessment: Postop day 2, doing well   Plan: Keep wound VAC in place.  Monitor output.  Empty as needed Toe touchdown weightbearing right lower extremity with walker. Pain control as needed Lovenox for DVT prophylaxis. She may work with therapies.   ID to manage continued antibiotic therapy. We will plan to change her wound VAC and assess the wound at bedside on Monday. She will likely go home with a wound vac.

## 2022-11-29 NOTE — Progress Notes (Signed)
     [name] is a 67 y.o. 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  Pain controlled on current regimen.  Wound VAC in place with output noted.  ID on board. Cultures pending. No reports fevers or chills. Pt eager to progress shome Monday if possible   BP (!) 123/52 (BP Location: Left Arm)   Pulse (!) 56   Temp 97.6 F (36.4 C)   Resp 18   Ht 5' 1.5" (1.562 m)   Wt 47.6 kg   SpO2 98%   BMI 19.52 kg/m     I/O last 3 completed shifts: In: 1978.5 [P.O.:1340; I.V.:188.5; IV Piggyback:450.1] Out: 50 [Drains:50]    Output by Drain (mL) 11/27/22 0701 - 11/27/22 1900 11/27/22 1901 - 11/28/22 0700 11/28/22 0701 - 11/28/22 1900 11/28/22 1901 - 11/29/22 0700 11/29/22 0701 - 11/29/22 1058  Negative Pressure Wound Therapy Ankle Right;Lateral  50 0 0 0    Exam: Awake and alert Respirations even and unlabored No acute distress   Right lower extremity demonstrates short leg splint and wound VAC in place.  Exposed skin is benign.  She is able to wiggle her toes.  Intact sensation light touch in the toes.  Warm and well-perfused distally.   Assessment: Postop day 3, doing well   Plan: Keep wound VAC in place.  Monitor output.  Empty as needed Toe touchdown weightbearing right lower extremity with walker. Pain control as needed Lovenox for DVT prophylaxis. She may work with therapies.   ID to manage continued antibiotic therapy. We will plan to change her wound VAC and assess the wound at bedside on Monday. She will likely go home with a wound vac.

## 2022-11-29 NOTE — Progress Notes (Signed)
Physical Therapy Treatment Patient Details Name: [name] MRN: [id number] DOB: [birth year] Today's Date: 11/29/2022   History of Present Illness Pt s/p I and D of R leg deep abscess and repair of R distal ankle wound dehiscence.  Pt with hx of ADHD, bil TSR, R ankle fx and cervical fusion.    PT Comments  Pt very motivated and mobilizing well but requiring increased time and with good awareness for safety and NWB.    If plan is discharge home, recommend the following:     Can travel by private vehicle        Equipment Recommendations  Rolling walker (2 wheels)    Recommendations for Other Services       Precautions / Restrictions Precautions Precautions: Fall Splint/Cast - Date Prophylactic Dressing Applied (if applicable): 11/26/22 Restrictions Weight Bearing Restrictions: Yes RLE Weight Bearing: Touchdown weight bearing     Mobility  Bed Mobility               General bed mobility comments: Pt up in chair and returns to same    Transfers Overall transfer level: Needs assistance Equipment used: Rolling walker (2 wheels) Transfers: Sit to/from Stand Sit to Stand: Supervision           General transfer comment: min cues for technique    Ambulation/Gait Ambulation/Gait assistance: Contact guard assist, Supervision Gait Distance (Feet): 200 Feet Assistive device: Rolling walker (2 wheels) Gait Pattern/deviations: Step-to pattern, Decreased step length - right, Decreased step length - left, Shuffle, Trunk flexed Gait velocity: decr     General Gait Details: min cues for sequence, posture and position from RW; good compliance with NWB throughout   Stairs             Wheelchair Mobility     Tilt Bed    Modified Rankin (Stroke Patients Only)       Balance Overall balance assessment: Needs assistance Sitting-balance support: No upper extremity supported, Feet supported Sitting balance-Leahy Scale: Good     Standing balance  support: Single extremity supported Standing balance-Leahy Scale: Fair                              Cognition Arousal: Alert Behavior During Therapy: WFL for tasks assessed/performed Overall Cognitive Status: Within Functional Limits for tasks assessed                                          Exercises      General Comments        Pertinent Vitals/Pain Pain Assessment Pain Assessment: 0-10 Pain Score: 5  Pain Location: R LE increased with dependency Pain Descriptors / Indicators: Aching, Sore Pain Intervention(s): Limited activity within patient's tolerance, Monitored during session, Premedicated before session, Patient requesting pain meds-RN notified    Home Living                          Prior Function            PT Goals (current goals can now be found in the care plan section) Acute Rehab PT Goals Patient Stated Goal: regain IND PT Goal Formulation: With patient Time For Goal Achievement: 12/04/22 Potential to Achieve Goals: Good Progress towards PT goals: Progressing toward goals    Frequency    Min 1X/week  PT Plan      Co-evaluation              AM-PAC PT "6 Clicks" Mobility   Outcome Measure  Help needed turning from your back to your side while in a flat bed without using bedrails?: A Little Help needed moving from lying on your back to sitting on the side of a flat bed without using bedrails?: A Little Help needed moving to and from a bed to a chair (including a wheelchair)?: A Little Help needed standing up from a chair using your arms (e.g., wheelchair or bedside chair)?: A Little Help needed to walk in hospital room?: A Little Help needed climbing 3-5 steps with a railing? : A Lot 6 Click Score: 17    End of Session Equipment Utilized During Treatment: Gait belt Activity Tolerance: Patient tolerated treatment well Patient left: in chair;with call bell/phone within reach;with chair alarm  set Nurse Communication: Mobility status PT Visit Diagnosis: Unsteadiness on feet (R26.81);Difficulty in walking, not elsewhere classified (R26.2)     Time: 4332-9518 PT Time Calculation (min) (ACUTE ONLY): 25 min  Charges:    $Gait Training: 23-37 mins PT General Charges $$ ACUTE PT VISIT: 1 Visit                     Mauro Kaufmann PT Acute Rehabilitation Services Pager [phone] Office [phone]    [facility] 11/29/2022, 12:35 PM

## 2022-11-29 NOTE — Plan of Care (Signed)
  Problem: Coping: Goal: Level of anxiety will decrease 11/29/2022 0428 by Kizzie Bane, RN Outcome: Progressing 11/29/2022 0416 by Kizzie Bane, RN Outcome: Progressing   Problem: Pain Management: Goal: General experience of comfort will improve 11/29/2022 0428 by Kizzie Bane, RN Outcome: Progressing 11/29/2022 0416 by Kizzie Bane, RN Outcome: Progressing

## 2022-11-29 NOTE — Progress Notes (Signed)
Physical Therapy Treatment Patient Details Name: [name] MRN: [id number] DOB: 1955-08-15 Today's Date: 11/29/2022   History of Present Illness Pt s/p I and D of R leg deep abscess and repair of R distal ankle wound dehiscence.  Pt with hx of ADHD, bil TSR, R ankle fx and cervical fusion.    PT Comments  Pt continues motivated and mobilizing well.  Pt hopeful for dc home tomorrow.    If plan is discharge home, recommend the following: Assistance with cooking/housework;Assist for transportation;Help with stairs or ramp for entrance   Can travel by private vehicle        Equipment Recommendations  Rolling walker (2 wheels) (youth level please)    Recommendations for Other Services       Precautions / Restrictions Precautions Precautions: Fall Splint/Cast - Date Prophylactic Dressing Applied (if applicable): 11/26/22 Restrictions Weight Bearing Restrictions: Yes RLE Weight Bearing: Touchdown weight bearing     Mobility  Bed Mobility               General bed mobility comments: Pt up in chair and returns to same    Transfers Overall transfer level: Needs assistance Equipment used: Rolling walker (2 wheels) Transfers: Sit to/from Stand Sit to Stand: Modified independent (Device/Increase time)           General transfer comment: min cues for technique    Ambulation/Gait Ambulation/Gait assistance: Contact guard assist, Supervision Gait Distance (Feet): 200 Feet Assistive device: Rolling walker (2 wheels) Gait Pattern/deviations: Step-to pattern, Decreased step length - right, Decreased step length - left, Shuffle, Trunk flexed Gait velocity: decr     General Gait Details: pt self-cues for sequence, posture and position from RW; good compliance with NWB throughout   Stairs             Wheelchair Mobility     Tilt Bed    Modified Rankin (Stroke Patients Only)       Balance Overall balance assessment: Needs  assistance Sitting-balance support: No upper extremity supported, Feet supported Sitting balance-Leahy Scale: Good     Standing balance support: Single extremity supported Standing balance-Leahy Scale: Fair                              Cognition Arousal: Alert Behavior During Therapy: WFL for tasks assessed/performed Overall Cognitive Status: Within Functional Limits for tasks assessed                                          Exercises      General Comments        Pertinent Vitals/Pain Pain Assessment Pain Assessment: 0-10 Pain Score: 4  Pain Location: R LE increased with dependency Pain Descriptors / Indicators: Aching, Sore Pain Intervention(s): Limited activity within patient's tolerance, Monitored during session, Premedicated before session, Ice applied    Home Living                          Prior Function            PT Goals (current goals can now be found in the care plan section) Acute Rehab PT Goals Patient Stated Goal: regain IND PT Goal Formulation: With patient Time For Goal Achievement: 12/04/22 Potential to Achieve Goals: Good Progress towards PT goals: Progressing toward goals  Frequency    Min 1X/week      PT Plan      Co-evaluation              AM-PAC PT "6 Clicks" Mobility   Outcome Measure  Help needed turning from your back to your side while in a flat bed without using bedrails?: A Little Help needed moving from lying on your back to sitting on the side of a flat bed without using bedrails?: A Little Help needed moving to and from a bed to a chair (including a wheelchair)?: A Little Help needed standing up from a chair using your arms (e.g., wheelchair or bedside chair)?: None Help needed to walk in hospital room?: A Little Help needed climbing 3-5 steps with a railing? : A Lot 6 Click Score: 18    End of Session Equipment Utilized During Treatment: Gait belt Activity Tolerance:  Patient tolerated treatment well Patient left: in chair;with call bell/phone within reach;with chair alarm set Nurse Communication: Mobility status PT Visit Diagnosis: Unsteadiness on feet (R26.81);Difficulty in walking, not elsewhere classified (R26.2)     Time: 5621-3086 PT Time Calculation (min) (ACUTE ONLY): 20 min  Charges:    $Gait Training: 8-22 mins PT General Charges $$ ACUTE PT VISIT: 1 Visit                     Mauro Kaufmann PT Acute Rehabilitation Services Pager [phone] Office [phone]    Denzil Bristol 11/29/2022, 3:20 PM

## 2022-11-29 NOTE — Plan of Care (Signed)
  Problem: Coping: Goal: Level of anxiety will decrease Outcome: Progressing   Problem: Pain Management: Goal: General experience of comfort will improve Outcome: Progressing   Problem: Safety: Goal: Ability to remain free from injury will improve Outcome: Progressing

## 2022-11-30 ENCOUNTER — Other Ambulatory Visit: Payer: Self-pay

## 2022-11-30 DIAGNOSIS — M869 Osteomyelitis, unspecified: Secondary | ICD-10-CM | POA: Diagnosis not present

## 2022-11-30 LAB — VANCOMYCIN, TROUGH: Vancomycin Tr: <4 ug/mL — ABNORMAL LOW (ref 15–20)

## 2022-11-30 MED ORDER — OXYCODONE HCL 5 MG PO TABS: ORAL_TABLET | ORAL | 0 refills | Status: DC

## 2022-11-30 MED ORDER — CHLORHEXIDINE GLUCONATE CLOTH 2 % EX PADS
6.0000 | MEDICATED_PAD | Freq: Every day | CUTANEOUS | Status: DC
  Administered 2022-11-30: 6 via TOPICAL

## 2022-11-30 MED ORDER — CEFEPIME IV (FOR PTA / DISCHARGE USE ONLY): 2.0000 g | Freq: Two times a day (BID) | INTRAVENOUS | 0 refills | Status: DC

## 2022-11-30 MED ORDER — SODIUM CHLORIDE 0.9 % IV SOLN: INTRAVENOUS | Status: DC | PRN

## 2022-11-30 MED ORDER — VANCOMYCIN HCL 750 MG/150ML IV SOLN: 750.0000 mg | Freq: Two times a day (BID) | INTRAVENOUS | Status: DC

## 2022-11-30 MED ORDER — SODIUM CHLORIDE 0.9% FLUSH: 10.0000 mL | Freq: Two times a day (BID) | INTRAVENOUS | Status: DC

## 2022-11-30 MED ORDER — SODIUM CHLORIDE 0.9% FLUSH: 10.0000 mL | INTRAVENOUS | Status: DC | PRN

## 2022-11-30 MED ORDER — VANCOMYCIN HCL 1250 MG/250ML IV SOLN
1250.0000 mg | INTRAVENOUS | Status: DC
  Filled 2022-11-30: qty 250

## 2022-11-30 MED ORDER — VANCOMYCIN IV (FOR PTA / DISCHARGE USE ONLY): 750.0000 mg | Freq: Two times a day (BID) | INTRAVENOUS | 0 refills | Status: DC

## 2022-11-30 MED ORDER — VANCOMYCIN HCL 1250 MG/250ML IV SOLN
1250.0000 mg | Freq: Once | INTRAVENOUS | Status: AC
  Administered 2022-11-30: 1250 mg via INTRAVENOUS
  Filled 2022-11-30: qty 250

## 2022-11-30 MED ORDER — ASPIRIN 325 MG PO TABS: ORAL_TABLET | ORAL | 0 refills | Status: DC

## 2022-11-30 MED ORDER — HEPARIN SOD (PORK) LOCK FLUSH 100 UNIT/ML IV SOLN
250.0000 [IU] | INTRAVENOUS | Status: AC | PRN
  Administered 2022-11-30: 250 [IU]
  Filled 2022-11-30: qty 3

## 2022-11-30 NOTE — Progress Notes (Addendum)
[facility]   Reason for visit: Follow up on osteomyelitis, non-healing wound  Interval History: culture with Enterobacter and now with Corynebacterium  No fever, WBC 4.5.   Day 5 antibiotics  Physical Exam: Constitutional:  Vitals:   11/29/22 2111 11/30/22 0529  BP: 113/63 (!) 118/55  Pulse: 63 64  Resp: 17 16  Temp: (!) 97.4 F (36.3 C) (!) 97.5 F (36.4 C)  SpO2: 99% 94%   patient appears in NAD Respiratory: Normal respiratory effort; CTA B MS: leg wrapped  Review of Systems: Constitutional: negative for fevers and chills  Lab Results  Component Value Date   WBC 4.5 11/26/2022   HGB 13.3 11/26/2022   HCT 41.5 11/26/2022   MCV 95.0 11/26/2022   PLT 127 (L) 11/26/2022    Lab Results  Component Value Date   CREATININE 0.52 11/26/2022   BUN 13 11/24/2022   NA 135 11/24/2022   K 4.0 11/24/2022   CL 101 11/24/2022   CO2 27 11/24/2022    Lab Results  Component Value Date   ALT 23 11/24/2022   AST 31 11/24/2022   ALKPHOS 88 11/24/2022     Microbiology: Recent Results (from the past 240 hour(s))  Aerobic/Anaerobic Culture w Gram Stain (surgical/deep wound)     Status: None (Preliminary result)   Collection Time: 11/26/22 12:01 PM   Specimen: Foot, Right; Wound  Result Value Ref Range Status   Specimen Description   Final    TISSUE RIGHT FOOT Performed at [facility], [address], [place], [place] [address]    Special Requests   Final    NONE Performed at [facility], [address], [place], [place] [address]    Gram Stain   Final    NO WBC SEEN RARE GRAM POSITIVE COCCI IN PAIRS RARE GRAM POSITIVE RODS Performed at [facility], [address], [place], [place] [address]    Culture   Final    ABUNDANT ENTEROBACTER CLOACAE MODERATE CORYNEBACTERIUM STRIATUM Standardized susceptibility testing for this organism is not available. NO ANAEROBES ISOLATED; CULTURE IN PROGRESS FOR 5 DAYS     Report Status PENDING  Incomplete   Organism ID, Bacteria ENTEROBACTER CLOACAE  Final      Susceptibility   Enterobacter cloacae - MIC*    CEFEPIME <=0.12 SENSITIVE Sensitive     CEFTAZIDIME <=1 SENSITIVE Sensitive     CIPROFLOXACIN <=0.25 SENSITIVE Sensitive     GENTAMICIN >=16 RESISTANT Resistant     IMIPENEM 0.5 SENSITIVE Sensitive     TRIMETH/SULFA >=320 RESISTANT Resistant     PIP/TAZO 8 SENSITIVE Sensitive ug/mL    * ABUNDANT ENTEROBACTER CLOACAE    Impression/Plan:  1. 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  Had previous rare Corynebacterium in gram stain previously, now moderate amountof growth.   I recommend continued treatment for 6 weeks and will use vancomycin and cefepime via PICC line  PICC line ordered  Diagnosis: Osteomyelitis and non-healing wound  Culture Result: Enterobacter and Corynebacterium striatum  No Known Allergies  OPAT Orders Discharge antibiotics to be given via PICC line Discharge antibiotics: vancomycin IV per pharmacy + cefepime 2 grams IV every 12 hours Per pharmacy protocol yes Aim for Vancomycin trough 15-20 or AUC 400-550 (unless otherwise indicated) Duration: 6 weeks End Date: January 10 2023  [facility] Care Per Protocol: yes  Home health RN for  IV administration and teaching; PICC line care and labs.    Labs weekly while on IV antibiotics: __x CBC with differential __ BMP __x CMP _x_ CRP x__ ESR _x_ Vancomycin trough __ CK  _x_ Please pull PIC at completion of IV antibiotics __ Please leave PIC in place until doctor has seen patient or been notified  Fax weekly labs to [phone]  Clinic Follow Up Appt: Dr. Renold Don 12/08/22 at 2pm  I have personally spent 55 minutes involved in face-to-face and non-face-to-face activities for this patient on the day of the visit. Professional time spent includes the  following activities: Preparing to see the patient (review of tests), Obtaining and/or reviewing separately obtained history (admission/discharge record), Performing a medically appropriate examination and/or evaluation , Ordering medications/tests/procedures, referring and communicating with other health care professionals, Documenting clinical information in the EMR, Independently interpreting results (not separately reported), Communicating results to the patient/family/caregiver, Counseling and educating the patient/family/caregiver and Care coordination (not separately reported).

## 2022-11-30 NOTE — Discharge Summary (Signed)
Patient ID: Falan Hensler MRN: [id number] DOB/AGE: Oct 04, 1955 67 y.o.  Admit date: 11/26/2022 Discharge date: 11/30/2022  Admission Diagnoses:  Principal Problem:   Leg wound, right   Discharge Diagnoses:  Same  Past Medical History:  Diagnosis Date   ADHD (attention deficit hyperactivity disorder)    Anemia    Anxiety    ARDS (adult respiratory distress syndrome) (HCC) 2007   Arthritis    Depression    Gallstones 01/07/2018   Ganglion cyst of dorsum of right wrist    Hepatitis    2004 non detectable now   MRSA (methicillin resistant Staphylococcus aureus) 2007   Osteoporosis    Pneumonia 2009   Prosthetic eye globe    right eye    Raynaud's disease     Surgeries: Procedure(s): EXCISIONAL DEBRIDEMENT OF NONHEALING RIGHT LEG WOUND, REPAIR OF WOUND DEHISCENCE, INCISION AND DRAINAGE OF DISTAL LEG ABSCESS, RIGHT FIBULAR SAUCERIZATION, APPLICATION OF SKIN GRAFT SUBSTITUTE, WOUND VAC PLACEMENT on 11/26/2022   Consultants:   Discharged Condition: Improved  Hospital Course: Kamira Mellette is an 67 y.o. female who was admitted 11/26/2022 for operative treatment ofLeg wound, right. Patient has severe unremitting pain that affects sleep, daily activities, and work/hobbies. After pre-op clearance the patient was taken to the operating room on 11/26/2022 and underwent  Procedure(s): EXCISIONAL DEBRIDEMENT OF NONHEALING RIGHT LEG WOUND, REPAIR OF WOUND DEHISCENCE, INCISION AND DRAINAGE OF DISTAL LEG ABSCESS, RIGHT FIBULAR SAUCERIZATION, APPLICATION OF SKIN GRAFT SUBSTITUTE, WOUND VAC PLACEMENT.    Patient was given perioperative antibiotics:  Anti-infectives (From admission, onward)    Start     Dose/Rate Route Frequency Ordered Stop   12/01/22 1000  vancomycin (VANCOREADY) IVPB 750 mg/150 mL        750 mg 150 mL/hr over 60 Minutes Intravenous Every 12 hours 11/30/22 1225     11/30/22 1315  vancomycin (VANCOREADY) IVPB 1250 mg/250 mL  Status:  Discontinued         1,250 mg 166.7 mL/hr over 90 Minutes Intravenous Every 24 hours 11/30/22 1220 11/30/22 1225   11/30/22 1315  vancomycin (VANCOREADY) IVPB 1250 mg/250 mL        1,250 mg 166.7 mL/hr over 90 Minutes Intravenous  Once 11/30/22 1225     11/30/22 0000  ceFEPime (MAXIPIME) IVPB        2 g Intravenous Every 12 hours 11/30/22 1408 01/10/23 2359   11/30/22 0000  vancomycin IVPB        750 mg Intravenous Every 12 hours 11/30/22 1408 01/10/23 2359   11/28/22 1200  vancomycin (VANCOREADY) IVPB 750 mg/150 mL  Status:  Discontinued        750 mg 150 mL/hr over 60 Minutes Intravenous Every 24 hours 11/27/22 1306 11/30/22 1016   11/27/22 1400  vancomycin (VANCOCIN) IVPB 1000 mg/200 mL premix        1,000 mg 200 mL/hr over 60 Minutes Intravenous  Once 11/27/22 1306 11/27/22 1634   11/27/22 1400  ceFEPIme (MAXIPIME) 2 g in sodium chloride 0.9 % 100 mL IVPB        2 g 200 mL/hr over 30 Minutes Intravenous Every 12 hours 11/27/22 1307     11/26/22 1700  ceFAZolin (ANCEF) IVPB 1 g/50 mL premix        1 g 100 mL/hr over 30 Minutes Intravenous Every 6 hours 11/26/22 1551 11/27/22 0454   11/26/22 1645  valACYclovir (VALTREX) tablet 1,000 mg        1,000 mg Oral Daily 11/26/22 1551  11/26/22 1030  ceFAZolin (ANCEF) IVPB 2g/100 mL premix        2 g 200 mL/hr over 30 Minutes Intravenous On call to O.R. 11/26/22 1021 11/26/22 1157        Patient was given sequential compression devices, early ambulation, and chemoprophylaxis to prevent DVT.  Patient benefited maximally from hospital stay and there were no complications.    Recent vital signs: Patient Vitals for the past 24 hrs:  BP Temp Temp src Pulse Resp SpO2  11/30/22 1345 124/85 98.2 F (36.8 C) -- 64 18 97 %  11/30/22 0529 (!) 118/55 (!) 97.5 F (36.4 C) Oral 64 16 94 %  11/29/22 2111 113/63 (!) 97.4 F (36.3 C) Oral 63 17 99 %  11/29/22 1536 104/69 97.7 F (36.5 C) -- 66 16 97 %     Recent laboratory studies: No results for input(s):  "WBC", "HGB", "HCT", "PLT", "NA", "K", "CL", "CO2", "BUN", "CREATININE", "GLUCOSE", "INR", "CALCIUM" in the last 72 hours.  Invalid input(s): "PT", "2"   Discharge Medications:   Allergies as of 11/30/2022   No Known Allergies      Medication List     TAKE these medications    amphetamine-dextroamphetamine 20 MG 24 hr capsule Commonly known as: Adderall XR Take 1 capsule (20 mg total) by mouth every morning.   aspirin 325 MG tablet Commonly known as: Bayer Aspirin Take 1 tablet by mouth for 30 DAYS for blood clot prevention   B-12 1000 MCG Tabs Take 1 tablet (1,000 mcg total) by mouth daily.   Bactrim DS 800-160 MG tablet Generic drug: sulfamethoxazole-trimethoprim Take 1 tablet by mouth 2 (two) times daily.   ceFEPime IVPB Commonly known as: MAXIPIME Inject 2 g into the vein every 12 (twelve) hours. Indication:  Polymicrobial R-ankle osteo First Dose: Yes Last Day of Therapy:  01/10/23 Labs - Once weekly:  CBC/D and BMP, Labs - Once weekly: ESR and CRP Method of administration: IV Push Please pull PIC at completion of IV antibiotics Method of administration may be changed at the discretion of home infusion pharmacist based upon assessment of the patient and/or caregiver's ability to self-administer the medication ordered.   celecoxib 200 MG capsule Commonly known as: CeleBREX Take 1 capsule (200 mg total) by mouth 2 (two) times daily.   cetirizine 10 MG tablet Commonly known as: ZYRTEC Take 10 mg by mouth daily.   estradiol 0.1 MG/GM vaginal cream Commonly known as: ESTRACE VAGINAL Apply a fingertip amount to urethra 2 times per week. What changed:  how much to take when to take this   multivitamin with minerals tablet Take 1 tablet by mouth daily. With folic acid   nystatin 100000 UNIT/ML suspension Commonly known as: MYCOSTATIN Take 5 mLs (500,000 Units total) by mouth 4 (four) times daily. What changed:  when to take this reasons to take this    ondansetron 4 MG tablet Commonly known as: ZOFRAN Take 1 tablet (4 mg total) by mouth every 6 (six) hours as needed for nausea.   Oxycodone HCl 20 MG Tabs Take 1 tablet (20 mg total) by mouth every 6 (six) hours as needed (pain). What changed: when to take this   oxyCODONE 5 MG immediate release tablet Commonly known as: Roxicodone Take 1-2 tablets by mouth every 6 hours as needed for breakthrough post op pain What changed: You were already taking a medication with the same name, and this prescription was added. Make sure you understand how and when to take each.  polyethylene glycol powder 17 GM/SCOOP powder Commonly known as: GLYCOLAX/MIRALAX TAKE 17 GRAMS BY MOUTH 2 TIMES DAILY AS NEEDED. What changed: See the new instructions.   pregabalin 300 MG capsule Commonly known as: LYRICA Take 1 capsule (300 mg total) by mouth 2 (two) times daily.   Restasis 0.05 % ophthalmic emulsion Generic drug: cycloSPORINE Place 1 drop into both eyes 2 times daily. What changed:  how much to take how to take this when to take this reasons to take this   Stool Softener/Laxative 50-8.6 MG tablet Generic drug: senna-docusate Take 1 tablet by mouth 2 (two) times daily.   valACYclovir 1000 MG tablet Commonly known as: Valtrex Take 1 tablet (1,000 mg total) by mouth 3 (three) times daily. What changed: when to take this   vancomycin IVPB Inject 750 mg into the vein every 12 (twelve) hours. Indication:  Polymicrobial R-ankle infection/osteo First Dose: Yes Last Day of Therapy:  01/10/23 Labs - "Sunday/Monday:  CBC/D, BMP, and vancomycin trough. Labs - Thursday:  BMP and vancomycin trough Labs - Once weekly: ESR and CRP Method of administration:Elastomeric Please pull PIC at completion of IV antibiotics Method of administration may be changed at the discretion of the patient and/or caregiver's ability to self-administer the medication ordered.   verapamil 120 MG CR tablet Commonly known  as: CALAN-SR Take 1 tablet (120 mg total) by mouth at bedtime.               Durable Medical Equipment  (From admission, onward)           Start     Ordered   11/27/22 1447  DME Walker rolling  Once       Comments: Youth size  Question Answer Comment  Walker: With 5 Inch Wheels   Patient needs a walker to treat with the following condition Leg wound, right      11/27/22 1446              Discharge Care Instructions  (From admission, onward)           Start     Ordered   11/30/22 0000  Change dressing on IV access line weekly and PRN  (Home infusion instructions - [facility] )        11/30/22 1408   11/30/22 0000  Touch down weight bearing       Comments: In splint  Question Answer Comment  Laterality right   Extremity Lower      11" /04/24 1429            Diagnostic Studies: [place] EKG SITE RITE  Result Date: 11/30/2022 If Site Rite image not attached, placement could not be confirmed due to current cardiac rhythm.  PERIPHERAL VASCULAR CATHETERIZATION  Result Date: 11/19/2022 Images from the original result were not included.   Patient name: Adrielle Polakowski MRN: [id number]        DOB: [birth year]        Sex: female  11/19/2022 Pre-operative Diagnosis: Nonhealing surgical wound right lower extremity Post-operative diagnosis:  Same Surgeon:  Cephus Shelling, MD Procedure Performed: 1.  Ultrasound-guided access left common femoral artery 2.  Aortogram with catheter selection of aorta 3.  Right lower extremity arteriogram with catheter selection of right external iliac artery 4.  Mynx closure of the left common femoral artery 5.  25 minutes of monitored moderate conscious sedation time  Indications: Patient is a 68 year old female seen for evaluation of PAD in the setting of nonhealing right  lower extremity surgical wound.  She had a right pilon ankle fracture with both lateral and medial malleolus fractures and has undergone multiple  orthopedic interventions with subsequent dehiscence of the surgical wound.  Vascular surgery has been asked to evaluate her inflow.  She presents after risks benefits discussed.  Findings:  Ultrasound-guided access left common femoral artery.  Aortogram showed patent infrarenal aorta with patent renal arteries bilaterally.  He does have some plaque at the iliac bifurcation that is not flow-limiting.  The right common and external iliac artery are widely patent.  The right common femoral, profunda, and SFA are widely patent.  The right above and below the knee popliteal artery is widely patent.  She has a patent trifurcation in the tibial vessels.  Dominant flow into the foot is through the anterior tibial that is inline below the ankle.  The peroneal and posterior tibial occluded above the ankle joint.  She has small vessel disease in the right foot with an incomplete plantar arch.             Procedure:  The patient was identified in the holding area and taken to room 8.  The patient was then placed supine on the table and prepped and draped in the usual sterile fashion.  A time out was called.  Patient received Versed and fentanyl for conscious moderate sedation.  Vital signs were monitored including heart rate, respiratory rate, oxygenation and blood pressure.  I was present for all of moderate sedation.  Ultrasound was used to evaluate the left common femoral artery.  It was patent .  A digital ultrasound image was acquired.  A micropuncture needle was used to access the left common femoral artery under ultrasound guidance.  An 018 wire was advanced without resistance and a micropuncture sheath was placed.  The 018 wire was removed and a benson wire was placed.  The micropuncture sheath was exchanged for a 5 french sheath.  An omniflush catheter was advanced over the wire to the level of L-1.  An abdominal angiogram was obtained.  Next, using the omniflush catheter and a benson wire, the aortic bifurcation was  crossed and the catheter was placed into theright external iliac artery and right runoff was obtained.  Ultimately after evaluating images no intervention was performed.  Mynx closure device was deployed in the left groin after removal of wires and catheters.  Plan: Patient is optimized from a vascular surgery standpoint.  She has inline flow in the right lower extremity through the anterior tibial with flow into the foot.  She does have significant small vessel disease that is not amendable to endovascular intervention as we discussed with her today.  Cephus Shelling, MD [facility] Office: [phone]   VAS [place] ABI WITH/WO TBI  Result Date: 11/03/2022  LOWER EXTREMITY DOPPLER STUDY Patient Name:  Amirra Herling  Date of Exam:   11/03/2022 Medical Rec #: [id number]              Accession #:    [id number] Date of Birth: [birth year]             Patient Gender: F Patient Age:   17 years Exam Location:  [facility] Procedure:      VAS [place] ABI WITH/WO TBI Referring Phys: Sherald Hess --------------------------------------------------------------------------------  Indications: Non healing right distal calf wound. History of mulitple right              ankle surgeries. High  Risk Factors: No history of smoking.  Performing Technologist: Thereasa Parkin RVT  Examination Guidelines: A complete evaluation includes at minimum, Doppler waveform signals and systolic blood pressure reading at the level of bilateral brachial, anterior tibial, and posterior tibial arteries, when vessel segments are accessible. Bilateral testing is considered an integral part of a complete examination. Photoelectric Plethysmograph (PPG) waveforms and toe systolic pressure readings are included as required and additional duplex testing as needed. Limited examinations for reoccurring indications may be performed as noted.  ABI Findings:  +---------+------------------+-----+----------+--------+ Right    Rt Pressure (mmHg)IndexWaveform  Comment  +---------+------------------+-----+----------+--------+ Brachial 131                                       +---------+------------------+-----+----------+--------+ ATA      139               1.06 triphasic          +---------+------------------+-----+----------+--------+ PTA      111               0.85 monophasic         +---------+------------------+-----+----------+--------+ Great Toe35                0.27                    +---------+------------------+-----+----------+--------+ +---------+------------------+-----+--------+-------+ Left     Lt Pressure (mmHg)IndexWaveformComment +---------+------------------+-----+--------+-------+ Brachial 126                                    +---------+------------------+-----+--------+-------+ ATA      142               1.08 biphasic        +---------+------------------+-----+--------+-------+ PTA      157               1.20 biphasic        +---------+------------------+-----+--------+-------+ Great Toe80                0.61                 +---------+------------------+-----+--------+-------+ +-------+-----------+-----------+------------+------------+ ABI/TBIToday's ABIToday's TBIPrevious ABIPrevious TBI +-------+-----------+-----------+------------+------------+ Right  1.06       0.27                                +-------+-----------+-----------+------------+------------+ Left   1.2        0.61                                +-------+-----------+-----------+------------+------------+ No previous ABI.  Summary: Right: Resting right ankle-brachial index is within normal range. The right toe-brachial index is abnormal. Although ankle brachial indices are within normal limits (0.95-1.29), arterial Doppler waveforms at the ankle suggest some component of arterial occlusive disease. Left:  Resting left ankle-brachial index is within normal range. The left toe-brachial index is abnormal. *See table(s) above for measurements and observations.  Electronically signed by Sherald Hess MD on 11/03/2022 at 4:25:24 PM.    Final     Disposition: Discharge disposition: 01-Home or Self Care       Discharge Instructions     [facility] pharmacist to adjust dose for Vancomycin, Aminoglycosides and other  anti-infective therapies as requested by physician.   Complete by: As directed    [facility] to provide Cath Flo 2mg    Complete by: As directed    Administer for PICC line occlusion and as ordered by physician for other access device issues.   Anaphylaxis Kit: Provided to treat any anaphylactic reaction to the medication being provided to the patient if First Dose or when requested by physician   Complete by: As directed    Epinephrine 1mg /ml vial / amp: Administer 0.3mg  (0.38ml) subcutaneously once for moderate to severe anaphylaxis, nurse to call physician and pharmacy when reaction occurs and call 911 if needed for immediate care   Diphenhydramine 50mg /ml IV vial: Administer 25-50mg  IV/IM PRN for first dose reaction, rash, itching, mild reaction, nurse to call physician and pharmacy when reaction occurs   Sodium Chloride 0.9% NS IV: Administer if needed for hypovolemic blood pressure drop or as ordered by physician after call to physician with anaphylactic reaction   Call MD / Call 911   Complete by: As directed    If you experience chest pain or shortness of breath, CALL 911 and be transported to the hospital emergency room.  If you develope a fever above 101 F, pus (white drainage) or increased drainage or redness at the wound, or calf pain, call your surgeon's office.   Change dressing on IV access line weekly and PRN   Complete by: As directed    Constipation Prevention   Complete by: As directed    Drink plenty of fluids.  Prune juice may be  helpful.  You may use a stool softener, such as Colace (over the counter) 100 mg twice a day.  Use MiraLax (over the counter) for constipation as needed.   Diet - low sodium heart healthy   Complete by: As directed    Flush IV access with Sodium Chloride 0.9% and Heparin 10 units/ml or 100 units/ml   Complete by: As directed    Home infusion instructions - [facility]   Complete by: As directed    Instructions: Flush IV access with Sodium Chloride 0.9% and Heparin 10units/ml or 100units/ml   Change dressing on IV access line: Weekly and PRN   Instructions Cath Flo 2mg : Administer for PICC Line occlusion and as ordered by physician for other access device   [facility] pharmacist to adjust dose for: Vancomycin, Aminoglycosides and other anti-infective therapies as requested by physician   Method of administration may be changed at the discretion of home infusion pharmacist based upon assessment of the patient and/or caregiver's ability to self-administer the medication ordered   Complete by: As directed    Post-operative opioid taper instructions:   Complete by: As directed    POST-OPERATIVE OPIOID TAPER INSTRUCTIONS: It is important to wean off of your opioid medication as soon as possible. If you do not need pain medication after your surgery it is ok to stop day one. Opioids include: Codeine, Hydrocodone(Norco, Vicodin), Oxycodone(Percocet, oxycontin) and hydromorphone amongst others.  Long term and even short term use of opiods can cause: Increased pain response Dependence Constipation Depression Respiratory depression And more.  Withdrawal symptoms can include Flu like symptoms Nausea, vomiting And more Techniques to manage these symptoms Hydrate well Eat regular healthy meals Stay active Use relaxation techniques(deep breathing, meditating, yoga) Do Not substitute Alcohol to help with tapering If you have been on opioids for less than two weeks and do  not have pain than it is ok  to stop all together.  Plan to wean off of opioids This plan should start within one week post op of your joint replacement. Maintain the same interval or time between taking each dose and first decrease the dose.  Cut the total daily intake of opioids by one tablet each day Next start to increase the time between doses. The last dose that should be eliminated is the evening dose.      Touch down weight bearing   Complete by: As directed    In splint   Laterality: right   Extremity: Lower        Follow-up Information     Terance Hart, MD Follow up in 4 day(s).   Specialty: Orthopedic Surgery Contact information: [address] [place] [address] [phone]                  Signed: Holiday Mcmenamin J [place] 11/30/2022, 2:30 PM

## 2022-11-30 NOTE — Progress Notes (Signed)
Pharmacy Antibiotic Note  [name] is a 67 y.o. female admitted on 11/26/2022 with polymicrobial R-ankle osteo. Pharmacy has been consulted for Vancomycin + Cefepime dosing.  A vancomycin trough this morning resulted as <4 mcg/ml which is SUBtherapeutic of goal 10-20 mcg/ml. Per view of prior levels in July - a dose of 1250 mg/24h was estimated to get her closer to goal range however with a VT of <4 mcg/ml - will double her dose for now and allow for further adjustments to be made outpatient by the OPAT team.   Plan: -  Vancomycin to 1250 mg IV x 1 now then start 750 mg IV every 12 hours on 11/5 AM - Continue Cefepime 2g IV every 12 hours - Will continue to follow renal function, culture results, LOT, and antibiotic de-escalation plans   Height: 5' 1.5" (156.2 cm) Weight: 47.6 kg (105 lb) IBW/kg (Calculated) : 48.95  Temp (24hrs), Avg:97.5 F (36.4 C), Min:97.4 F (36.3 C), Max:97.7 F (36.5 C)  Recent Labs  Lab 11/24/22 1509 11/26/22 1445 11/26/22 1636  WBC 5.5  --  4.5  CREATININE 0.56 0.52  --     Estimated Creatinine Clearance: 52.7 mL/min (by C-G formula based on SCr of 0.52 mg/dL).    No Known Allergies  Antimicrobials this admission: Cefazolin pre/post-op 10/31 >> 11/1 Vancomycin 11/1 >> Cefepime 11/1 >>  Dose adjustments this admission:  Microbiology results: 10/31 R-foot tissue >> Enterobacter cloacae + corynebacterium striatum  Thank you for allowing pharmacy to be a part of this patient's care.  Georgina Pillion, PharmD, BCPS, BCIDP Infectious Diseases Clinical Pharmacist 11/30/2022 11:20 AM   **Pharmacist phone directory can now be found on [email or web address] ([email or web address]).  Listed under [facility].

## 2022-11-30 NOTE — Anesthesia Postprocedure Evaluation (Signed)
Anesthesia Post Note  Patient: [name]  Procedure(s) Performed: EXCISIONAL DEBRIDEMENT OF NONHEALING RIGHT LEG WOUND, REPAIR OF WOUND DEHISCENCE, INCISION AND DRAINAGE OF DISTAL LEG ABSCESS, RIGHT FIBULAR SAUCERIZATION, APPLICATION OF SKIN GRAFT SUBSTITUTE, WOUND VAC PLACEMENT (Right: Ankle)     Patient location during evaluation: PACU Anesthesia Type: General Level of consciousness: awake Pain management: pain level controlled Vital Signs Assessment: post-procedure vital signs reviewed and stable Respiratory status: spontaneous breathing, nonlabored ventilation and respiratory function stable Cardiovascular status: blood pressure returned to baseline and stable Postop Assessment: no apparent nausea or vomiting Anesthetic complications: no   No notable events documented.  Last Vitals:  Vitals:   11/29/22 2111 11/30/22 0529  BP: 113/63 (!) 118/55  Pulse: 63 64  Resp: 17 16  Temp: (!) 36.3 C (!) 36.4 C  SpO2: 99% 94%    Last Pain:  Vitals:   11/30/22 0529  TempSrc: Oral  PainSc:                  Catheryn Bacon Cheri Ayotte

## 2022-11-30 NOTE — Progress Notes (Addendum)
PHARMACY CONSULT NOTE FOR:  OUTPATIENT  PARENTERAL ANTIBIOTIC THERAPY (OPAT)  Indication: Corynebacterium striatum + Enterobacter cloacae R-ankle osteo Regimen: Cefepime 2g IV every 12 hours and Vancomycin 750 IV every 12 hours End date: 01/10/23  IV antibiotic discharge orders are pended. To discharging provider:  please sign these orders via discharge navigator,  Select New Orders & click on the button choice - Manage This Unsigned Work.     Thank you for allowing pharmacy to be a part of this patient's care.  Georgina Pillion, PharmD, BCPS, BCIDP Infectious Diseases Clinical Pharmacist 11/30/2022 11:14 AM   **Pharmacist phone directory can now be found on [email or web address] ([email or web address]).  Listed under [facility].

## 2022-11-30 NOTE — Progress Notes (Signed)
Mobility Specialist - Progress Note   11/30/22 1004  Mobility  Activity Ambulated with assistance in hallway  Level of Assistance Standby assist, set-up cues, supervision of patient - no hands on  Assistive Device Front wheel walker  Distance Ambulated (ft) 200 ft  RLE Weight Bearing WBAT  Activity Response Tolerated well  Mobility Referral Yes  $Mobility charge 1 Mobility  Mobility Specialist Start Time (ACUTE ONLY) [id number]  Mobility Specialist Stop Time (ACUTE ONLY) [id number]  Mobility Specialist Time Calculation (min) (ACUTE ONLY) 22 min   Pt received in bed and agreeable to mobility. No complaints during session. Pt to recliner after session with all needs met.    [facility]

## 2022-11-30 NOTE — TOC Transition Note (Signed)
Transition of Care [facility]) - CM/SW Discharge Note   Patient Details  Name: [name] MRN: [id number] Date of Birth: [birth year]  [facility] CM/SW Contact:  Amada Jupiter, LCSW Phone Number: 11/30/2022, 2:16 PM   Clinical Narrative:    Orders placed for pt to dc home with wound VAC, IV antibiotics and HHRN services.  Have discussed with pt who has no preference for agency for [facility] - ordered placed with [facility] and unit delivered to room.  Pt requests [facility] and [facility] and abx coverage - orders placed/ accepted.  Anticipate teaching with Jeri Modena, RN this afternoon on the abx administration.  Anticipate pt will discharge home today and friend will provide dc transportation.  No further TOC needs.   Final next level of care: Home w Home Health Services Barriers to Discharge: Barriers Resolved   Patient Goals and CMS Choice CMS Medicare.gov Compare Post Acute Care list provided to:: Patient Choice offered to / list presented to : Patient  Discharge Placement                         Discharge Plan and Services Additional resources added to the After Visit Summary for   In-house Referral: Clinical Social Work   Post Acute Care Choice: Durable Medical Equipment          DME Arranged: Negative pressure wound device DME Agency: [facility] Date DME Agency Contacted: 11/30/22 Time DME Agency Contacted: 1100 Representative spoke with at DME Agency: [facility] HH Arranged: RN HH Agency: [facility] Date [facility] Agency Contacted: 11/30/22 Time HH Agency Contacted: 1416 Representative spoke with at [facility] Agency: Tresa Endo  Social Determinants of Health (SDOH) Interventions SDOH Screenings   Food Insecurity: No Food Insecurity (11/26/2022)  Housing: Low Risk  (11/26/2022)  Transportation Needs: No Transportation Needs (11/26/2022)  Utilities: Not At Risk (11/26/2022)  Alcohol Screen: Low Risk  (08/14/2021)  Depression (PHQ2-9): Low Risk  (09/29/2022)   Financial Resource Strain: Low Risk  (06/08/2022)   Received from [facility]  Physical Activity: Sufficiently Active (08/14/2021)  Social Connections: Unknown (03/25/2022)   Received from [facility]  Stress: No Stress Concern Present (08/14/2021)  Tobacco Use: Low Risk  (11/26/2022)     Readmission Risk Interventions    11/30/2022    2:15 PM  Readmission Risk Prevention Plan  Post Dischage Appt Complete  Medication Screening Complete  Transportation Screening Complete

## 2022-11-30 NOTE — Progress Notes (Signed)
     [name] is a 67 y.o. female   Orthopaedic diagnosis: Right chronic nonhealing leg wound   Surgery: Debridement and repair of nonhealing right leg wound, I&D right leg abscess, right fibula saucerization, application of skin graft substitute and wound VAC placement 11/26/2022  Subjective: Patient resting comfortably.  Pain controlled on current regimen.  Wound VAC in place with continued output noted.  She is hopeful for discharge home.  Awaiting antibiotic recommendations from infectious disease.  Her cultures have came back revealing for Enterobacter Cloace and Corynebacterium striatum with susceptibilities available.  Objectyive: Vitals:   11/29/22 2111 11/30/22 0529  BP: 113/63 (!) 118/55  Pulse: 63 64  Resp: 17 16  Temp: (!) 97.4 F (36.3 C) (!) 97.5 F (36.4 C)  SpO2: 99% 94%     Exam: Awake and alert Respirations even and unlabored No acute distress  Right leg demonstrate short leg point with wound VAC in place.  Splint and wound VAC were removed uneventfully.  Proximal wound with skin graft in place.  No purulence or erythema. Distal incision is well approximated with sutures in place. Wound VAC was replaced uneventfully by Dr. Susa Simmonds with good suction noted.  Splint was reapplied.  Patient tolerated this well.  Toes warm and well perfused following procedure.   Assessment: Postop day 4, doing well   Plan: Keep wound VAC in place.  Monitor output.  Empty canister as needed Toe touch weightbearing right lower extremity with walker.   Pain control as needed.  Plan for oxycodone for breakthrough pain at discharge. Lovenox for DVT prophylaxis while inpatient, plan to transition to aspirin for DVT prophylaxis outpatient.   Continue working with therapies while in house  Patient is suitable for discharge from orthopedic standpoint once final antibiotics recommendations made by ID.  She may require PICC line.  She will be discharged home with the wound VAC  in place.  We will try to set up home health services but we will plan to leave the wound VAC in place until outpatient follow-up on Friday 12/04/2022 with Dr. Susa Simmonds at [facility] orthopedics. We will plan to reassess the wound and change wound VAC at that time.   Imanie Darrow J. [place], PA-C

## 2022-11-30 NOTE — Progress Notes (Signed)
Wound vac changed to home unit. Patient verbalized understanding of wound vac care, dressing change at MD office and return. All wound vac materials w/ patient. Reviewed d/c instructions w/ patient, verbalized understanding of all instructions. Awaiting PICC and abx education.

## 2022-11-30 NOTE — Progress Notes (Signed)
Peripherally Inserted Central Catheter Placement  The IV Nurse has discussed with the patient and/or persons authorized to consent for the patient, the purpose of this procedure and the potential benefits and risks involved with this procedure.  The benefits include less needle sticks, lab draws from the catheter, and the patient may be discharged home with the catheter. Risks include, but not limited to, infection, bleeding, blood clot (thrombus formation), and puncture of an artery; nerve damage and irregular heartbeat and possibility to perform a PICC exchange if needed/ordered by physician.  Alternatives to this procedure were also discussed.  Bard Power PICC patient education guide, fact sheet on infection prevention and patient information card has been provided to patient /or left at bedside.    PICC Placement Documentation  PICC Single Lumen 11/30/22 Right Brachial 36 cm 0 cm (Active)  Indication for Insertion or Continuance of Line Home intravenous therapies (PICC only) 11/30/22 1200  Exposed Catheter (cm) 0 cm 11/30/22 1200  Site Assessment Clean, Dry, Intact 11/30/22 1200  Line Status Flushed;Saline locked;Blood return noted 11/30/22 1200  Dressing Type Transparent;Securing device 11/30/22 1200  Dressing Status Antimicrobial disc in place;Clean, Dry, Intact 11/30/22 1200  Line Care Connections checked and tightened 11/30/22 1200  Line Adjustment (NICU/IV Team Only) No 11/30/22 1200  Dressing Intervention New dressing 11/30/22 1200  Dressing Change Due 12/07/22 11/30/22 1200       Franne Grip Renee 11/30/2022, 12:54 PM

## 2022-11-30 NOTE — Plan of Care (Signed)
  Problem: Coping: Goal: Level of anxiety will decrease Outcome: Progressing   Problem: Pain Management: Goal: General experience of comfort will improve Outcome: Progressing   Problem: Safety: Goal: Ability to remain free from injury will improve Outcome: Progressing

## 2022-11-30 NOTE — Discharge Instructions (Addendum)
DR. Susa Simmonds FOOT & ANKLE SURGERY POST-OP INSTRUCTIONS  Keep wound vac in place until follow up on 12/04/2022   Pain Management The numbing medicine and your leg will last around 18 hours, take a dose of your pain medicine as soon as you feel it wearing off to avoid rebound pain. Keep your foot elevated above heart level.  Make sure that your heel hangs free ('floats'). Take all prescribed medication as directed. If taking narcotic pain medication you may want to use an over-the-counter stool softener to avoid constipation. You may take over-the-counter NSAIDs (ibuprofen, naproxen, etc.) as well as over-the-counter acetaminophen as directed on the packaging as a supplement for your pain and may also use it to wean away from the prescription medication.  Activity Non-weightbearing Keep splint intact  First Postoperative Visit Your first postop visit will be at least 2 weeks after surgery.  This should be scheduled when you schedule surgery. If you do not have a postoperative visit scheduled please call [phone] to schedule an appointment. At the appointment your incision will be evaluated for suture removal, x-rays will be obtained if necessary.  General Instructions Swelling is very common after foot and ankle surgery.  It often takes 3 months for the foot and ankle to begin to feel comfortable.  Some amount of swelling will persist for 6-12 months. DO NOT change the dressing.  If there is a problem with the dressing (too tight, loose, gets wet, etc.) please contact Dr. Donnie Mesa office. DO NOT get the dressing wet.  For showers you can use an over-the-counter cast cover or wrap a washcloth around the top of your dressing and then cover it with a plastic bag and tape it to your leg. DO NOT soak the incision (no tubs, pools, bath, etc.) until you have approval from Dr. Susa Simmonds.  Contact Dr. Garret Reddish office or go to Emergency Room if: Temperature above 101 F. Increasing pain that is  unresponsive to pain medication or elevation Excessive redness or swelling in your foot Dressing problems - excessive bloody drainage, looseness or tightness, or if dressing gets wet Develop pain, swelling, warmth, or discoloration of your calf

## 2022-12-01 DIAGNOSIS — T8189XD Other complications of procedures, not elsewhere classified, subsequent encounter: Secondary | ICD-10-CM | POA: Diagnosis not present

## 2022-12-01 DIAGNOSIS — Z792 Long term (current) use of antibiotics: Secondary | ICD-10-CM | POA: Diagnosis not present

## 2022-12-01 DIAGNOSIS — S81801A Unspecified open wound, right lower leg, initial encounter: Secondary | ICD-10-CM | POA: Diagnosis not present

## 2022-12-01 DIAGNOSIS — Z791 Long term (current) use of non-steroidal anti-inflammatories (NSAID): Secondary | ICD-10-CM | POA: Diagnosis not present

## 2022-12-01 LAB — AEROBIC/ANAEROBIC CULTURE W GRAM STAIN (SURGICAL/DEEP WOUND): Gram Stain: NONE SEEN

## 2022-12-02 ENCOUNTER — Encounter (HOSPITAL_COMMUNITY): Payer: Self-pay | Admitting: Orthopaedic Surgery

## 2022-12-02 ENCOUNTER — Telehealth: Payer: Self-pay | Admitting: *Deleted

## 2022-12-02 ENCOUNTER — Telehealth: Payer: Self-pay

## 2022-12-02 DIAGNOSIS — S81801A Unspecified open wound, right lower leg, initial encounter: Secondary | ICD-10-CM | POA: Diagnosis not present

## 2022-12-02 NOTE — Telephone Encounter (Signed)
HH RN [name] with [facility] called and states patient's PICC line won't flush. They are unable to complete her antibiotic infusion tonight.   Spoke with Jeri Modena, RN with [facility], [facility] is unable to do Cathflo in the home. Contacted [facility] to see if they are able to see the patient tomorrow for a dose of cathflo. Awaiting response.    Sandie Ano, RN

## 2022-12-02 NOTE — Telephone Encounter (Signed)
Received on (10/31/22) via of fax Request For Discharge Orders from [facility].  Given to provider to complete.    Request for Discharge Orders completed,signed,and faxed back to [facility].  Confirmation received and copy scanned into the chart.//AB/CMA

## 2022-12-03 ENCOUNTER — Other Ambulatory Visit: Payer: Self-pay | Admitting: Internal Medicine

## 2022-12-03 ENCOUNTER — Ambulatory Visit: Payer: Commercial Managed Care - PPO

## 2022-12-03 ENCOUNTER — Other Ambulatory Visit: Payer: Self-pay

## 2022-12-03 ENCOUNTER — Other Ambulatory Visit (HOSPITAL_COMMUNITY): Payer: Self-pay

## 2022-12-03 VITALS — BP 153/51 | HR 68 | Temp 97.4°F | Resp 16 | Ht 61.5 in | Wt 107.0 lb

## 2022-12-03 DIAGNOSIS — Z452 Encounter for adjustment and management of vascular access device: Secondary | ICD-10-CM

## 2022-12-03 DIAGNOSIS — M86171 Other acute osteomyelitis, right ankle and foot: Secondary | ICD-10-CM

## 2022-12-03 MED ORDER — ALTEPLASE 2 MG IJ SOLR
2.0000 mg | Freq: Once | INTRAMUSCULAR | Status: DC | PRN
  Filled 2022-12-03: qty 2

## 2022-12-03 MED ORDER — HEPARIN SOD (PORK) LOCK FLUSH 100 UNIT/ML IV SOLN
250.0000 [IU] | Freq: Once | INTRAVENOUS | Status: DC | PRN
  Filled 2022-12-03: qty 5

## 2022-12-03 NOTE — Progress Notes (Signed)
Diagnosis: PICC Line in Place  Provider:  Chilton Greathouse, MD   Discharge: Condition: Good, Destination: Home . AVS provided to patient.   Performed by:  Adriana Mccallum, RN    Unable to flush the Cath flow. Provider notified.

## 2022-12-03 NOTE — Telephone Encounter (Signed)
Dr. Luciana Axe has placed orders for IR evaluation. [facility] can see her at 10 am tomorrow. Appointment information provided to patient.  Sandie Ano, RN

## 2022-12-03 NOTE — Telephone Encounter (Signed)
[facility] can see Olegario Messier at 2:15 today for [employer]. Kenedie is agreeable to this appointment time, provided her with infusion center address and contact info.   Sandie Ano, RN

## 2022-12-03 NOTE — Telephone Encounter (Signed)
Infusion center was unable to instill Cathflo due to resistance from the line. Message sent to Dr. Luciana Axe to see if IR evaluation is warranted for possible PICC replacement. Awaiting response.   Sandie Ano, RN

## 2022-12-04 ENCOUNTER — Other Ambulatory Visit: Payer: Self-pay | Admitting: Internal Medicine

## 2022-12-04 ENCOUNTER — Ambulatory Visit (HOSPITAL_COMMUNITY)
Admission: RE | Admit: 2022-12-04 | Discharge: 2022-12-04 | Disposition: A | Discharge status: Home / Self Care | Payer: Commercial Managed Care - PPO | Source: Ambulatory Visit | Attending: Internal Medicine | Admitting: Internal Medicine

## 2022-12-04 DIAGNOSIS — Z452 Encounter for adjustment and management of vascular access device: Secondary | ICD-10-CM | POA: Insufficient documentation

## 2022-12-04 DIAGNOSIS — M86171 Other acute osteomyelitis, right ankle and foot: Secondary | ICD-10-CM

## 2022-12-04 DIAGNOSIS — T82598A Other mechanical complication of other cardiac and vascular devices and implants, initial encounter: Secondary | ICD-10-CM | POA: Diagnosis not present

## 2022-12-04 HISTORY — PX: IR CHEST FLUORO: IMG2383

## 2022-12-04 HISTORY — PX: IR PATIENT EVAL TECH 0-60 MINS: IMG5564

## 2022-12-04 MED ORDER — HEPARIN SOD (PORK) LOCK FLUSH 100 UNIT/ML IV SOLN
500.0000 [IU] | Freq: Once | INTRAVENOUS | Status: AC
  Administered 2022-12-04: 500 [IU] via INTRAVENOUS

## 2022-12-04 MED ORDER — HEPARIN SOD (PORK) LOCK FLUSH 100 UNIT/ML IV SOLN
INTRAVENOUS | Status: AC
  Filled 2022-12-04: qty 5

## 2022-12-07 DIAGNOSIS — S81801A Unspecified open wound, right lower leg, initial encounter: Secondary | ICD-10-CM | POA: Diagnosis not present

## 2022-12-08 ENCOUNTER — Other Ambulatory Visit: Payer: Self-pay

## 2022-12-08 ENCOUNTER — Ambulatory Visit (INDEPENDENT_AMBULATORY_CARE_PROVIDER_SITE_OTHER): Payer: Commercial Managed Care - PPO | Admitting: Internal Medicine

## 2022-12-08 ENCOUNTER — Telehealth: Payer: Self-pay

## 2022-12-08 VITALS — BP 153/84 | HR 79 | Temp 98.2°F | Ht 61.5 in | Wt 107.0 lb

## 2022-12-08 DIAGNOSIS — F4320 Adjustment disorder, unspecified: Secondary | ICD-10-CM | POA: Diagnosis not present

## 2022-12-08 DIAGNOSIS — R110 Nausea: Secondary | ICD-10-CM | POA: Diagnosis not present

## 2022-12-08 DIAGNOSIS — R509 Fever, unspecified: Secondary | ICD-10-CM

## 2022-12-08 DIAGNOSIS — M869 Osteomyelitis, unspecified: Secondary | ICD-10-CM

## 2022-12-08 LAB — LAB REPORT - SCANNED: EGFR: 100

## 2022-12-08 MED ORDER — ONDANSETRON HCL 4 MG PO TABS: 4.0000 mg | ORAL_TABLET | Freq: Three times a day (TID) | ORAL | 0 refills | Status: DC | PRN

## 2022-12-08 MED ORDER — ONDANSETRON HCL 8 MG PO TABS: 8.0000 mg | ORAL_TABLET | Freq: Three times a day (TID) | ORAL | 3 refills | Status: DC | PRN

## 2022-12-08 MED ORDER — ONDANSETRON HCL 8 MG PO TABS: 8.0000 mg | ORAL_TABLET | Freq: Three times a day (TID) | ORAL | 3 refills | Status: AC | PRN

## 2022-12-08 NOTE — Telephone Encounter (Signed)
Sounds good  They can check peripheral bcx if fever and cdiff if diarrhea  Other wise if she still ok can keep previous f/u  Sooner she she feels unwell  Thanks

## 2022-12-08 NOTE — Telephone Encounter (Signed)
Patient here for appointment with Dr. Renold Don. Provider aware of her concerns.   Sandie Ano, RN

## 2022-12-08 NOTE — Telephone Encounter (Signed)
Received call from patient's home health nurse, Victorino Dike, with some concerns.   She says she spoke with Olegario Messier and she complained of having loose stool on Sunday night that smelled terrible and was black. She is overall not feeling well and appears weak. Victorino Dike reports her temperature was 102 on Friday, but is now 97.5 as of yesterday, her face remains flushed.   Victorino Dike also reports she completed patient's PICC line dressing change yesterday and the line came out about 1 cm. States [name] is now complaining of some pain and redness at the PICC site.   Called Taeya to discuss these concerns, no answer. Left HIPAA compliant voicemail requesting callback.   Per [facility], her hgb and hct are normal at 12.1 and 36.6 as of 11/11.  Will route to provider.   Sandie Ano, RN

## 2022-12-08 NOTE — Progress Notes (Signed)
[facility]  Patient Active Problem List   Diagnosis Date Noted   Leg wound, right 11/26/2022   PAD (peripheral artery disease) (HCC) 11/17/2022   PICC (peripherally inserted central catheter) in place 09/29/2022   Medication monitoring encounter 09/29/2022   Cellulitis of right ankle 08/15/2022   Open wound of lower back and pelvis w/o penentrat into retroperitoneum, subsequent encounter 08/14/2022   Osteomyelitis of right ankle (HCC) 08/14/2022   HTN (hypertension) 08/14/2022   Pelvic fracture (HCC) 07/23/2022   Hematoma and contusion 07/22/2022   Right ankle pain 04/16/2022   OA (osteoarthritis) 02/23/2022   Closed right ankle fracture 02/03/2022   Prolapse of female pelvic organs 06/13/2020   Bladder prolapse, female, acquired 02/02/2020   Raynaud's disease without gangrene 02/10/2019   Acute pain of left knee 03/23/2018   Gallstones 01/07/2018   ADHD (attention deficit hyperactivity disorder), inattentive type 11/24/2016   Onychomycosis due to dermatophyte 08/23/2015   Liver fibrosis 04/02/2015   Radiculopathy 12/12/2014   Other specified anemias 10/11/2014   Substance abuse in remission (HCC) 08/28/2014   Chronic hepatitis C without hepatic coma (HCC) 07/06/2014   MRSA PNEUMONIA 10/01/2008   MUSCULOSKELETAL PAIN 09/27/2008   ALLERGIC RHINITIS 04/12/2008   CELLULITIS AND ABSCESS OF FACE 04/12/2008      Subjective:    Patient ID: [name], female    DOB: Mar 26, 1955, 67 y.o.   MRN: [id number]  Chief Complaint  Patient presents with   Follow-up    HPI:  [name] is a 67 y.o. female here for hospital f/u of right foot om  12/08/22 id clinic visit She has developed fever the last few days and diarrhea but no sx the last 2 days Poor appetite Right foot pain Recently saw ortho and wound vac change; will see again this week   No Known Allergies    Outpatient Medications Prior to Visit   Medication Sig Dispense Refill   amphetamine-dextroamphetamine (ADDERALL XR) 20 MG 24 hr capsule Take 1 capsule (20 mg total) by mouth every morning. 30 capsule 0   aspirin (BAYER ASPIRIN) 325 MG tablet Take 1 tablet by mouth for 30 DAYS for blood clot prevention 30 tablet 0   BACTRIM DS 800-160 MG tablet Take 1 tablet by mouth 2 (two) times daily.     ceFEPime (MAXIPIME) IVPB Inject 2 g into the vein every 12 (twelve) hours. Indication:  Polymicrobial R-ankle osteo First Dose: Yes Last Day of Therapy:  01/10/23 Labs - Once weekly:  CBC/D and BMP, Labs - Once weekly: ESR and CRP Method of administration: IV Push Please pull PIC at completion of IV antibiotics Method of administration may be changed at the discretion of home infusion pharmacist based upon assessment of the patient and/or caregiver's ability to self-administer the medication ordered. 78 Units 0   celecoxib (CELEBREX) 200 MG capsule Take 1 capsule (200 mg total) by mouth 2 (two) times daily. 180 capsule 3   cetirizine (ZYRTEC) 10 MG tablet Take 10 mg by mouth daily.     cyanocobalamin 1000 MCG tablet Take 1 tablet (1,000 mcg total) by mouth daily. 30 tablet 0   cycloSPORINE (RESTASIS) 0.05 % ophthalmic emulsion Place 1 drop into both eyes 2 times daily. (Patient taking differently: Place 2 drops into both eyes 2 (two) times daily as needed (Dry eyes).) 60 each 11   estradiol (ESTRACE VAGINAL) 0.1 MG/GM vaginal cream Apply a fingertip amount to urethra 2 times per  week. (Patient taking differently: Place 1 Applicatorful vaginally once a week.) 42.5 g 3   Multiple Vitamins-Minerals (MULTIVITAMIN WITH MINERALS) tablet Take 1 tablet by mouth daily. With folic acid     nystatin (MYCOSTATIN) 100000 UNIT/ML suspension Take 5 mLs (500,000 Units total) by mouth 4 (four) times daily. (Patient taking differently: Take 5 mLs by mouth daily as needed (thrush).) 473 mL 2   ondansetron (ZOFRAN) 4 MG tablet Take 1 tablet (4 mg total) by mouth  every 6 (six) hours as needed for nausea. 20 tablet 0   oxyCODONE (ROXICODONE) 5 MG immediate release tablet Take 1-2 tablets by mouth every 6 hours as needed for breakthrough post op pain 20 tablet 0   Oxycodone HCl 20 MG TABS Take 1 tablet (20 mg total) by mouth every 6 (six) hours as needed (pain). (Patient taking differently: Take 20 mg by mouth 3 (three) times daily as needed (pain).) 120 tablet 0   polyethylene glycol powder (GLYCOLAX/MIRALAX) powder TAKE 17 GRAMS BY MOUTH 2 TIMES DAILY AS NEEDED. (Patient taking differently: Take 17 g by mouth as needed for moderate constipation.) 3162 g 1   pregabalin (LYRICA) 300 MG capsule Take 1 capsule (300 mg total) by mouth 2 (two) times daily. 60 capsule 5   senna-docusate (SENOKOT-S) 8.6-50 MG tablet Take 1 tablet by mouth 2 (two) times daily. 60 tablet 0   valACYclovir (VALTREX) 1000 MG tablet Take 1 tablet (1,000 mg total) by mouth 3 (three) times daily. (Patient taking differently: Take 1,000 mg by mouth daily.) 30 tablet 1   vancomycin IVPB Inject 750 mg into the vein every 12 (twelve) hours. Indication:  Polymicrobial R-ankle infection/osteo First Dose: Yes Last Day of Therapy:  01/10/23 Labs - Sunday/Monday:  CBC/D, BMP, and vancomycin trough. Labs - Thursday:  BMP and vancomycin trough Labs - Once weekly: ESR and CRP Method of administration:Elastomeric Please pull PIC at completion of IV antibiotics Method of administration may be changed at the discretion of the patient and/or caregiver's ability to self-administer the medication ordered. 78 Units 0   verapamil (CALAN-SR) 120 MG CR tablet Take 1 tablet (120 mg total) by mouth at bedtime. 90 tablet 0   No facility-administered medications prior to visit.     Social History   Socioeconomic History   Marital status: Single    Spouse name: Not on file   Number of children: Not on file   Years of education: Not on file   Highest education level: Bachelor's degree (e.g., BA, AB, BS)   Occupational History   Not on file  Tobacco Use   Smoking status: Never   Smokeless tobacco: Never  Vaping Use   Vaping status: Never Used  Substance and Sexual Activity   Alcohol use: Not Currently    Comment: occ   Drug use: No   Sexual activity: Never    Birth control/protection: Post-menopausal  Other Topics Concern   Not on file  Social History Narrative   Not on file   Social Determinants of Health   Financial Resource Strain: Low Risk  (06/08/2022)   Received from [facility]   Overall Financial Resource Strain (CARDIA)    Difficulty of Paying Living Expenses: Not very hard  Food Insecurity: No Food Insecurity (11/26/2022)   Hunger Vital Sign    Worried About Running Out of Food in the Last Year: Never true    Ran Out of Food in the Last Year: Never true  Transportation Needs: No Transportation  Needs (11/26/2022)   PRAPARE - Administrator, Civil Service (Medical): No    Lack of Transportation (Non-Medical): No  Physical Activity: Sufficiently Active (08/14/2021)   Exercise Vital Sign    Days of Exercise per Week: 4 days    Minutes of Exercise per Session: 90 min  Stress: No Stress Concern Present (08/14/2021)   [employer] of Occupational Health - Occupational Stress Questionnaire    Feeling of Stress : Not at all  Social Connections: Unknown (03/25/2022)   Received from [facility], [facility]   Social Network    Social Network: Not on file  Intimate Partner Violence: Not At Risk (11/26/2022)   Humiliation, Afraid, Rape, and Kick questionnaire    Fear of Current or Ex-Partner: No    Emotionally Abused: No    Physically Abused: No    Sexually Abused: No      Review of Systems    All other ros negative    Objective:    BP (!) 153/84   Pulse 79   Temp 98.2 F (36.8 C) (Temporal)   Ht 5' 1.5" (1.562 m)   Wt 107 lb (48.5 kg)   SpO2 97%   BMI 19.89 kg/m  Nursing note and vital signs reviewed.  Physical  Exam     General/constitutional: no distress, pleasant HEENT: Normocephalic, PER, Conj Clear, EOMI, Oropharynx clear Neck supple CV: rrr no mrg Lungs: clear to auscultation, normal respiratory effort Abd: Soft, Nontender Ext: no edema Skin: No Rash Neuro: nonfocal MSK: no peripheral joint swelling/tenderness/warmth; back spines nontender; right foot still in same cast splint; has wound vac; minimal serosanguinous liquid   Central line presence: 12/08/22 visit per patient the picc had come off 1 inch; the site is slightly tender but no erythema/purulence/fluctuance   Labs: Reviewed 12/07/22 opat labs Vanc trough 4 Cbc 5/12/136; cr 0.6 Crp 48 (11 on 09/15/22) Sed rate 33   Micro:  Serology:  Imaging:  Assessment & Plan:   Problem List Items Addressed This Visit   None     No orders of the defined types were placed in this encounter.    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 She was discharged on vanc/cefepime until January 10, 2023   ------------ 12/08/22 id assessment Fever over this weekend but none the last 2 days Does feel weak -- appetite poor No rash/itch Had diarrhea but resolved the last 2 days; just nausea now. Able to keep food down when she can  eat Patient lives at home with her mother No sign of URI  or viral illness otherwise   -will get blood cultures peripherally today -no cdiff testing as her diarrhea resolved. No leukocytosis from 12/07/22 labs -f/u in 1-2 weeks -can keep picc line for now -zofran for nausea -f/u orthopedics this week   Follow-up: Return in about 1 week (around 12/15/2022).      Raymondo Band, MD [facility] 12/08/2022, 2:17 PM

## 2022-12-08 NOTE — Patient Instructions (Signed)
Take zofran as needed for nausea   Blood cultures today   Continue vancomycin and cefepime via your current picc line   See me in 1- 2weeks

## 2022-12-09 ENCOUNTER — Ambulatory Visit (INDEPENDENT_AMBULATORY_CARE_PROVIDER_SITE_OTHER): Payer: Commercial Managed Care - PPO | Admitting: Family Medicine

## 2022-12-09 ENCOUNTER — Encounter: Payer: Self-pay | Admitting: Family Medicine

## 2022-12-09 VITALS — BP 120/78 | HR 92 | Temp 97.9°F | Wt 102.8 lb

## 2022-12-09 DIAGNOSIS — R350 Frequency of micturition: Secondary | ICD-10-CM

## 2022-12-09 DIAGNOSIS — M545 Low back pain, unspecified: Secondary | ICD-10-CM

## 2022-12-09 LAB — POC URINALSYSI DIPSTICK (AUTOMATED)
Bilirubin, UA: NEGATIVE
Blood, UA: NEGATIVE
Glucose, UA: NEGATIVE
Ketones, UA: NEGATIVE
Leukocytes, UA: NEGATIVE
Nitrite, UA: NEGATIVE
Protein, UA: POSITIVE — AB
Spec Grav, UA: 1.015 (ref 1.010–1.025)
Urobilinogen, UA: 0.2 U/dL
pH, UA: 6 (ref 5.0–8.0)

## 2022-12-09 NOTE — Progress Notes (Signed)
   Subjective:    Patient ID: [name], female    DOB: 27-Jul-1955, 67 y.o.   MRN: [id number]  HPI Here for a possible UTI. She has had several surgeries on the right foot, but the wound has not been able to close. Now she has osteomyelitis in the foot, and she is seeing [facility] and infectious [facility]. She is currently getting Vancomycin and Cefipime via a PICC line. Over the past few days she has developed a low back pain and is urinating more frequently than usual. No fever, no urgency to urinate or burning. She has nausea from the antibiotics, but she has Ondansetron to use. She is drinking fluids. Her UA today is clear.    Review of Systems  Constitutional: Negative.   Respiratory: Negative.    Cardiovascular: Negative.   Genitourinary:  Positive for frequency. Negative for dysuria, flank pain, hematuria and urgency.  Musculoskeletal:  Positive for arthralgias and back pain.       Objective:   Physical Exam Constitutional:      Comments: Her right foot is bandaged and in a splint with a vacuum drain in place.   Cardiovascular:     Rate and Rhythm: Normal rate and regular rhythm.     Pulses: Normal pulses.     Heart sounds: Normal heart sounds.  Pulmonary:     Effort: Pulmonary effort is normal.     Breath sounds: Normal breath sounds.  Abdominal:     General: Abdomen is flat. Bowel sounds are normal. There is no distension.     Palpations: Abdomen is soft. There is no mass.     Tenderness: There is no abdominal tenderness. There is no right CVA tenderness, guarding or rebound.     Hernia: No hernia is present.  Neurological:     Mental Status: She is alert.           Assessment & Plan:  Her low back pain is likely musculoskeletal, and is the result of lying around recently. We will culture the urine sample to be complete.  Gershon Crane, MD

## 2022-12-09 NOTE — Addendum Note (Signed)
Addended by: Carola Rhine on: 12/09/2022 04:23 PM   Modules accepted: Orders

## 2022-12-10 ENCOUNTER — Other Ambulatory Visit (HOSPITAL_COMMUNITY): Payer: Self-pay

## 2022-12-10 ENCOUNTER — Other Ambulatory Visit: Payer: Self-pay

## 2022-12-10 DIAGNOSIS — T8189XA Other complications of procedures, not elsewhere classified, initial encounter: Secondary | ICD-10-CM | POA: Diagnosis not present

## 2022-12-10 LAB — URINE CULTURE
MICRO NUMBER:: 15725437
Result:: NO GROWTH
SPECIMEN QUALITY:: ADEQUATE

## 2022-12-10 MED ORDER — CYCLOSPORINE 0.05 % OP EMUL
1.0000 [drp] | Freq: Two times a day (BID) | OPHTHALMIC | 11 refills | Status: AC
  Filled 2022-12-10: qty 60, 30d supply, fill #0
  Filled 2023-02-26: qty 60, 30d supply, fill #1

## 2022-12-11 DIAGNOSIS — M869 Osteomyelitis, unspecified: Secondary | ICD-10-CM | POA: Diagnosis not present

## 2022-12-14 DIAGNOSIS — D649 Anemia, unspecified: Secondary | ICD-10-CM | POA: Diagnosis not present

## 2022-12-14 DIAGNOSIS — F1120 Opioid dependence, uncomplicated: Secondary | ICD-10-CM | POA: Diagnosis not present

## 2022-12-14 DIAGNOSIS — F419 Anxiety disorder, unspecified: Secondary | ICD-10-CM | POA: Diagnosis not present

## 2022-12-14 DIAGNOSIS — T8189XD Other complications of procedures, not elsewhere classified, subsequent encounter: Secondary | ICD-10-CM | POA: Diagnosis not present

## 2022-12-14 DIAGNOSIS — I7300 Raynaud's syndrome without gangrene: Secondary | ICD-10-CM | POA: Diagnosis not present

## 2022-12-14 DIAGNOSIS — F909 Attention-deficit hyperactivity disorder, unspecified type: Secondary | ICD-10-CM | POA: Diagnosis not present

## 2022-12-14 DIAGNOSIS — Z452 Encounter for adjustment and management of vascular access device: Secondary | ICD-10-CM | POA: Diagnosis not present

## 2022-12-14 DIAGNOSIS — I10 Essential (primary) hypertension: Secondary | ICD-10-CM | POA: Diagnosis not present

## 2022-12-14 DIAGNOSIS — F32A Depression, unspecified: Secondary | ICD-10-CM | POA: Diagnosis not present

## 2022-12-14 DIAGNOSIS — Z792 Long term (current) use of antibiotics: Secondary | ICD-10-CM | POA: Diagnosis not present

## 2022-12-14 LAB — CULTURE, BLOOD (SINGLE)
MICRO NUMBER:: 15719919
MICRO NUMBER:: 15719920
Result:: NO GROWTH
SPECIMEN QUALITY:: ADEQUATE

## 2022-12-15 ENCOUNTER — Telehealth: Payer: Self-pay

## 2022-12-15 NOTE — Telephone Encounter (Signed)
Thanks Fredric Mare! I will keep an eye our for her next vanc trough.

## 2022-12-15 NOTE — Telephone Encounter (Signed)
Thank you :)

## 2022-12-15 NOTE — Telephone Encounter (Signed)
Spoke with home health nurse regarding issues with PICC line. She reports line remains patent, flushing successfully with both saline and heparin, but she was unable to get blood from the line yesterday. Patient has not taken vancomycin doses yesterday or today due to concern about not getting lab work. Reviewed her labs, and creatinine has been very stable on therapy, with most recent Vanc trough of 4.4 last week. Assured nurse that Vancomycin dose was very likely to be safe, and recommended peripheral stick for labs if unable to obtain labs via PICC tomorrow. She will inform patient of this, and assure patient that she is okay to continue vancomycin with prompt lab work to be obtained tomorrow. I have left a HIPAA compliant voicemail for patient to regarding above, and encouraged her to resume her vancomycin.

## 2022-12-16 ENCOUNTER — Encounter: Payer: Self-pay | Admitting: Surgical

## 2022-12-16 ENCOUNTER — Ambulatory Visit: Payer: Commercial Managed Care - PPO | Admitting: Surgical

## 2022-12-16 DIAGNOSIS — M86171 Other acute osteomyelitis, right ankle and foot: Secondary | ICD-10-CM

## 2022-12-16 DIAGNOSIS — S31000D Unspecified open wound of lower back and pelvis without penetration into retroperitoneum, subsequent encounter: Secondary | ICD-10-CM

## 2022-12-16 DIAGNOSIS — S91001D Unspecified open wound, right ankle, subsequent encounter: Secondary | ICD-10-CM

## 2022-12-16 DIAGNOSIS — S82891G Other fracture of right lower leg, subsequent encounter for closed fracture with delayed healing: Secondary | ICD-10-CM
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
## 2022-12-17 ENCOUNTER — Telehealth: Payer: Self-pay | Admitting: Surgical

## 2022-12-17 NOTE — Telephone Encounter (Signed)
Pt was called and sch in 2 weeks, with Materials engineer PA-C at 130pm 12-4

## 2022-12-18 DIAGNOSIS — F1120 Opioid dependence, uncomplicated: Secondary | ICD-10-CM | POA: Diagnosis not present

## 2022-12-18 DIAGNOSIS — D649 Anemia, unspecified: Secondary | ICD-10-CM | POA: Diagnosis not present

## 2022-12-18 DIAGNOSIS — Z792 Long term (current) use of antibiotics: Secondary | ICD-10-CM | POA: Diagnosis not present

## 2022-12-18 DIAGNOSIS — Z452 Encounter for adjustment and management of vascular access device: Secondary | ICD-10-CM | POA: Diagnosis not present

## 2022-12-18 DIAGNOSIS — F909 Attention-deficit hyperactivity disorder, unspecified type: Secondary | ICD-10-CM | POA: Diagnosis not present

## 2022-12-18 DIAGNOSIS — F419 Anxiety disorder, unspecified: Secondary | ICD-10-CM | POA: Diagnosis not present

## 2022-12-18 DIAGNOSIS — I10 Essential (primary) hypertension: Secondary | ICD-10-CM | POA: Diagnosis not present

## 2022-12-18 DIAGNOSIS — I7300 Raynaud's syndrome without gangrene: Secondary | ICD-10-CM | POA: Diagnosis not present

## 2022-12-18 DIAGNOSIS — T8189XD Other complications of procedures, not elsewhere classified, subsequent encounter: Secondary | ICD-10-CM | POA: Diagnosis not present

## 2022-12-18 DIAGNOSIS — F32A Depression, unspecified: Secondary | ICD-10-CM | POA: Diagnosis not present

## 2022-12-19 DIAGNOSIS — M869 Osteomyelitis, unspecified: Secondary | ICD-10-CM | POA: Diagnosis not present

## 2022-12-21 DIAGNOSIS — F1120 Opioid dependence, uncomplicated: Secondary | ICD-10-CM | POA: Diagnosis not present

## 2022-12-21 DIAGNOSIS — D649 Anemia, unspecified: Secondary | ICD-10-CM | POA: Diagnosis not present

## 2022-12-21 DIAGNOSIS — I10 Essential (primary) hypertension: Secondary | ICD-10-CM | POA: Diagnosis not present

## 2022-12-21 DIAGNOSIS — I7300 Raynaud's syndrome without gangrene: Secondary | ICD-10-CM | POA: Diagnosis not present

## 2022-12-21 DIAGNOSIS — F909 Attention-deficit hyperactivity disorder, unspecified type: Secondary | ICD-10-CM | POA: Diagnosis not present

## 2022-12-21 DIAGNOSIS — T8189XD Other complications of procedures, not elsewhere classified, subsequent encounter: Secondary | ICD-10-CM | POA: Diagnosis not present

## 2022-12-21 DIAGNOSIS — F32A Depression, unspecified: Secondary | ICD-10-CM | POA: Diagnosis not present

## 2022-12-21 DIAGNOSIS — Z452 Encounter for adjustment and management of vascular access device: Secondary | ICD-10-CM | POA: Diagnosis not present

## 2022-12-21 DIAGNOSIS — F419 Anxiety disorder, unspecified: Secondary | ICD-10-CM | POA: Diagnosis not present

## 2022-12-21 DIAGNOSIS — Z792 Long term (current) use of antibiotics: Secondary | ICD-10-CM | POA: Diagnosis not present

## 2022-12-22 LAB — LAB REPORT - SCANNED
EGFR: 93
EGFR: 96

## 2022-12-23 ENCOUNTER — Other Ambulatory Visit: Payer: Self-pay

## 2022-12-23 ENCOUNTER — Other Ambulatory Visit: Payer: Self-pay | Admitting: Family Medicine

## 2022-12-23 ENCOUNTER — Encounter: Payer: Self-pay | Admitting: Internal Medicine

## 2022-12-23 ENCOUNTER — Other Ambulatory Visit (HOSPITAL_COMMUNITY): Payer: Self-pay

## 2022-12-23 ENCOUNTER — Telehealth: Payer: Self-pay | Admitting: Pharmacist

## 2022-12-23 ENCOUNTER — Ambulatory Visit: Payer: Commercial Managed Care - PPO | Admitting: Internal Medicine

## 2022-12-23 VITALS — BP 135/78 | HR 76 | Temp 97.9°F | Ht 61.0 in | Wt 110.0 lb

## 2022-12-23 DIAGNOSIS — M86171 Other acute osteomyelitis, right ankle and foot: Secondary | ICD-10-CM

## 2022-12-23 DIAGNOSIS — Z452 Encounter for adjustment and management of vascular access device: Secondary | ICD-10-CM

## 2022-12-23 DIAGNOSIS — Z5181 Encounter for therapeutic drug level monitoring: Secondary | ICD-10-CM

## 2022-12-23 NOTE — Progress Notes (Signed)
   Subjective:    Patient ID: [name], female    DOB: [birth year], 67 y.o.   MRN: [id number]  HPI Dinara is here for follow up of osteomyelitis.   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  She has been on prolonged vancomycin and cefepime planned through December 15th.  Vancomycin trough noted to be elevated but was done after the infusion.    Review of Systems  Constitutional:  Negative for chills, fatigue and fever.  Gastrointestinal:  Negative for diarrhea and nausea.  Skin:  Negative for rash.       Objective:   Physical Exam Eyes:     General: No scleral icterus. Pulmonary:     Effort: Pulmonary effort is normal.  Musculoskeletal:     Comments: Right ankle with two open areas, no drainage, dry, improved from last visit.    Neurological:     Mental Status: She is alert.   SH: no tobacco        Assessment & Plan:

## 2022-12-23 NOTE — Telephone Encounter (Signed)
Received faxed labs today from 11/26 with vancomycin trough results of 55.7. Spoke with Amy from [facility] to confirm this was drawn right after infusion. Scr remains within normal range without significant change.   Patient has been administering vancomycin ~9am, and nurse comes to draw labs ~11am. Amy is working with them to coordinate lab timing. Will repeat labs on Monday once patient returns home from traveling for Thanksgiving. She is on your schedule this morning; just wanted to keep you updated.  Margarite Gouge, PharmD, CPP, BCIDP, AAHIVP Clinical Pharmacist Practitioner Infectious Diseases Clinical Pharmacist [facility]

## 2022-12-23 NOTE — Assessment & Plan Note (Signed)
Creat wnl.  Will continue to monitor twice weekly with [facility]

## 2022-12-23 NOTE — Assessment & Plan Note (Signed)
Picc line with some dried blood around it but no signfiicant erythema or other concerns.

## 2022-12-23 NOTE — Assessment & Plan Note (Signed)
Appears clinically to be healing well now and will plan to continue with antibiotics through December 15th with follow up just before finishing to assure it still looks good.  ESR up at 67 so will continue to monitor.  Follow up in 2 weeks

## 2022-12-25 ENCOUNTER — Other Ambulatory Visit (HOSPITAL_COMMUNITY): Payer: Self-pay

## 2022-12-27 DIAGNOSIS — M869 Osteomyelitis, unspecified: Secondary | ICD-10-CM | POA: Diagnosis not present

## 2022-12-28 ENCOUNTER — Telehealth: Payer: Self-pay | Admitting: Pharmacist

## 2022-12-28 ENCOUNTER — Other Ambulatory Visit (HOSPITAL_COMMUNITY): Payer: Self-pay

## 2022-12-28 ENCOUNTER — Ambulatory Visit: Payer: Commercial Managed Care - PPO | Admitting: Internal Medicine

## 2022-12-28 DIAGNOSIS — F909 Attention-deficit hyperactivity disorder, unspecified type: Secondary | ICD-10-CM | POA: Diagnosis not present

## 2022-12-28 DIAGNOSIS — T8189XD Other complications of procedures, not elsewhere classified, subsequent encounter: Secondary | ICD-10-CM | POA: Diagnosis not present

## 2022-12-28 DIAGNOSIS — Z452 Encounter for adjustment and management of vascular access device: Secondary | ICD-10-CM | POA: Diagnosis not present

## 2022-12-28 DIAGNOSIS — I10 Essential (primary) hypertension: Secondary | ICD-10-CM | POA: Diagnosis not present

## 2022-12-28 DIAGNOSIS — F419 Anxiety disorder, unspecified: Secondary | ICD-10-CM | POA: Diagnosis not present

## 2022-12-28 DIAGNOSIS — Z792 Long term (current) use of antibiotics: Secondary | ICD-10-CM | POA: Diagnosis not present

## 2022-12-28 DIAGNOSIS — D649 Anemia, unspecified: Secondary | ICD-10-CM | POA: Diagnosis not present

## 2022-12-28 DIAGNOSIS — F1120 Opioid dependence, uncomplicated: Secondary | ICD-10-CM | POA: Diagnosis not present

## 2022-12-28 DIAGNOSIS — M86171 Other acute osteomyelitis, right ankle and foot: Secondary | ICD-10-CM

## 2022-12-28 DIAGNOSIS — I7300 Raynaud's syndrome without gangrene: Secondary | ICD-10-CM | POA: Diagnosis not present

## 2022-12-28 DIAGNOSIS — F32A Depression, unspecified: Secondary | ICD-10-CM | POA: Diagnosis not present

## 2022-12-28 MED ORDER — LINEZOLID 600 MG PO TABS: 600.0000 mg | ORAL_TABLET | Freq: Two times a day (BID) | ORAL | 0 refills | Status: DC

## 2022-12-28 MED ORDER — CIPROFLOXACIN HCL 750 MG PO TABS: 750.0000 mg | ORAL_TABLET | Freq: Two times a day (BID) | ORAL | 0 refills | Status: DC

## 2022-12-28 NOTE — Telephone Encounter (Signed)
LOV 12/09/2022

## 2022-12-28 NOTE — Telephone Encounter (Signed)
[name], RN with [facility], called regarding patient's PICC line. Patient is on vancomycin + cefepime for osteo until 12/15. RN stated that on 11/25 PICC line was 1.5 cm exposed and that today it is 3.5 cm exposed. She is unable to draw labs through PICC line and also tried via venipuncture and was unable to get labs that way as well. Patient is due for a vanc trough today.   Discussed with Dr. Renold Don and will give orders to pull PICC line today and start patient on linezolid + ciprofloxacin. Called [name] back who had already left patient's house. Called [facility] and spoke to Wallace - gave verbal order to d/c PICC line today. Called and spoke to patient. Advised her that someone will be coming today or tomorrow to pull it. Counseled her to take linezolid 600 mg PO BID + ciprofloxacin 750 mg PO BID for 14 more days. Counseled on common side effects and sent Rxs to [facility]. She has an appointment with Dr. Renold Don on 12/12. She will call with any issues before then.   Of note, patient's platelets are a tad low already at 127. Will check CBC when she sees Dr. Renold Don next week.  Ha Shannahan L. Miesha Bachmann, PharmD, BCIDP, AAHIVP, CPP Clinical Pharmacist Practitioner Infectious Diseases Clinical Pharmacist [facility] 12/28/2022, 11:26 AM

## 2022-12-29 ENCOUNTER — Other Ambulatory Visit: Payer: Self-pay

## 2022-12-29 ENCOUNTER — Other Ambulatory Visit (HOSPITAL_COMMUNITY): Payer: Self-pay

## 2022-12-29 DIAGNOSIS — T8189XD Other complications of procedures, not elsewhere classified, subsequent encounter: Secondary | ICD-10-CM | POA: Diagnosis not present

## 2022-12-29 DIAGNOSIS — Z452 Encounter for adjustment and management of vascular access device: Secondary | ICD-10-CM | POA: Diagnosis not present

## 2022-12-29 DIAGNOSIS — D649 Anemia, unspecified: Secondary | ICD-10-CM | POA: Diagnosis not present

## 2022-12-29 DIAGNOSIS — I10 Essential (primary) hypertension: Secondary | ICD-10-CM | POA: Diagnosis not present

## 2022-12-29 DIAGNOSIS — I7300 Raynaud's syndrome without gangrene: Secondary | ICD-10-CM | POA: Diagnosis not present

## 2022-12-29 DIAGNOSIS — F1120 Opioid dependence, uncomplicated: Secondary | ICD-10-CM | POA: Diagnosis not present

## 2022-12-29 DIAGNOSIS — F909 Attention-deficit hyperactivity disorder, unspecified type: Secondary | ICD-10-CM | POA: Diagnosis not present

## 2022-12-29 DIAGNOSIS — F419 Anxiety disorder, unspecified: Secondary | ICD-10-CM | POA: Diagnosis not present

## 2022-12-29 DIAGNOSIS — F32A Depression, unspecified: Secondary | ICD-10-CM | POA: Diagnosis not present

## 2022-12-29 DIAGNOSIS — Z792 Long term (current) use of antibiotics: Secondary | ICD-10-CM | POA: Diagnosis not present

## 2022-12-29 MED ORDER — AMPHETAMINE-DEXTROAMPHET ER 20 MG PO CP24
20.0000 mg | ORAL_CAPSULE | ORAL | 0 refills | Status: DC
  Filled 2022-12-29 – 2023-03-02 (×4): qty 30, 30d supply, fill #0
  Filled ????-??-??: fill #0

## 2022-12-29 MED ORDER — AMPHETAMINE-DEXTROAMPHET ER 20 MG PO CP24
20.0000 mg | ORAL_CAPSULE | ORAL | 0 refills | Status: DC
  Filled 2022-12-29 – 2023-02-01 (×2): qty 30, 30d supply, fill #0

## 2022-12-29 MED ORDER — AMPHETAMINE-DEXTROAMPHET ER 20 MG PO CP24
20.0000 mg | ORAL_CAPSULE | ORAL | 0 refills | Status: DC
  Filled 2022-12-29: qty 30, 30d supply, fill #0

## 2022-12-29 NOTE — Telephone Encounter (Signed)
Done

## 2022-12-30 ENCOUNTER — Encounter: Payer: Self-pay | Admitting: Surgical

## 2022-12-30 ENCOUNTER — Ambulatory Visit: Payer: Commercial Managed Care - PPO | Admitting: Surgical

## 2022-12-30 DIAGNOSIS — T148XXA Other injury of unspecified body region, initial encounter: Secondary | ICD-10-CM

## 2022-12-30 DIAGNOSIS — S82891G Other fracture of right lower leg, subsequent encounter for closed fracture with delayed healing: Secondary | ICD-10-CM

## 2022-12-30 DIAGNOSIS — S31000D Unspecified open wound of lower back and pelvis without penetration into retroperitoneum, subsequent encounter: Secondary | ICD-10-CM | POA: Diagnosis not present

## 2022-12-30 NOTE — Progress Notes (Signed)
Referring Provider Nelwyn Salisbury, MD [address] [place],  [place] [address]   CC: No chief complaint on file.     Sophiana Scalisi is an 67 y.o. female.  HPI: Patient is a 67 year old female here for follow-up in regards to 2 complaints.  She has a right lower extremity wound that has been managed by orthopedics, she most recently underwent excisional debridement of her right lower wound, incision and drainage of distal leg abscess, right fibular saucerization, application of skin graft substitute with Dr. Susa Simmonds on 11/26/2022.  She feels as if she is continuing to notice some improvement of the right lower extremity.  She has been doing Adaptic, K-Y jelly, Kerlix, Ace wrap dressing changes daily.  She is pleased with the progress she has noted.  She reports that she was receiving IV antibiotics via a PICC line, but the PICC line started to come out and she is now on oral antibiotics.  PICC line was pulled on 12/28/2022.  In regards to her left back, she feels as if she has noticed some increased swelling.  She also notices some increased pain.  She is not having any infectious symptoms.   Review of Systems General: No fevers or chills  Physical Exam    12/23/2022   10:24 AM 12/09/2022    8:48 AM 12/08/2022    2:02 PM  Vitals with BMI  Height 5\' 1"   5' 1.5"  Weight 110 lbs 102 lbs 13 oz 107 lbs  BMI 20.8 19.11 19.89  Systolic 135 120 604  Diastolic 78 78 84  Pulse 76 92 79    General:  No acute distress,  Alert and oriented, Non-Toxic, Normal speech and affect Right lower extremity: Bottom wound is 3 x 1.2 cm, top wound is 3 x 2 cm.  0.3 cm depth of both wounds.  There is no surrounding cellulitic changes.  There is no foul odors.  She does have a biofilm noted over the right lower extremity wounds.  She does have some right lower extremity edema noted.  There is no erythema or cellulitic changes noted throughout the entire lower extremity.  Compartments are soft and  nontender.  Left back: Left back wound is completely healed, she does have some scabbing noted along the inferior medial portion.  I do not appreciate any subcutaneous fluid collections with palpation.  She does have some tenderness with palpation.  There is no active drainage from the left back wound.  Assessment/Plan Patient is a very pleasant 67 year old female here for follow-up in regards to 2 wounds, a wound of her left back and her right lower extremity.  She is also seeing orthopedics in regards to the right lower extremity.  Right lower extremity: Right lower extremity wound was debrided using a curette, patient tolerated this well.  Thick biofilm was removed and granulation tissue was exposed.  She had good bleeding noted.  Bleeding was stopped with light pressure with Vashe soaked gauze.  The wound was dressed with Adaptic, K-Y jelly, Kerlix, Ace wrap.  Recommend continuing with dressing changes daily.  I do not see any signs of infection or concern on exam.  Recommend continuing to follow-up with orthopedics and infectious disease as well.  We will continue to assist with wound management on the right lower extremity.  The total area debrided of the right lower extremity was approximately approximately 9 cm.  Left back wound: Discussed with patient I do not appreciate any signs of infection or concern  on exam, we have discussed an MRI in the past to evaluate for swelling, but she did not not want to do that at that time.  I do think she would benefit from physical therapy as she has been ambulating with an abnormal gait.  We have discussed physical therapy in the past, but today she is agreeable to proceed with physical therapy.  We did discuss that she also had shearing of her left back muscle which is likely contributing to her posture change.  I think it would be very beneficial for her to have assistance with regaining strength.  I would like her to follow-up with Dr. Ulice Bold in a few  weeks for reevaluation and to discuss possibility for additional imaging.  Pictures were obtained of the patient and placed in the chart with the patient's or guardian's permission.  Encouraged patient to call with questions or concerns.   [name] Damontre Millea 12/30/2022, 3:49 PM

## 2022-12-31 ENCOUNTER — Telehealth: Payer: Self-pay

## 2022-12-31 DIAGNOSIS — S91001A Unspecified open wound, right ankle, initial encounter: Secondary | ICD-10-CM | POA: Diagnosis not present

## 2022-12-31 NOTE — Telephone Encounter (Signed)
Faxed wound care supply request to [facility] with confirmed receipt.

## 2023-01-02 ENCOUNTER — Other Ambulatory Visit: Payer: Self-pay | Admitting: Family Medicine

## 2023-01-04 NOTE — Telephone Encounter (Signed)
Asking does she need the appointmetn she has scheduled or will she get her refill

## 2023-01-04 NOTE — Telephone Encounter (Signed)
FYI Pt has a PMV with Dr Clent Ridges on 01/05/23

## 2023-01-04 NOTE — Telephone Encounter (Signed)
Pt has a PMV on 01/05/23

## 2023-01-05 ENCOUNTER — Telehealth (INDEPENDENT_AMBULATORY_CARE_PROVIDER_SITE_OTHER): Payer: Commercial Managed Care - PPO | Admitting: Family Medicine

## 2023-01-05 ENCOUNTER — Other Ambulatory Visit (HOSPITAL_COMMUNITY): Payer: Self-pay

## 2023-01-05 ENCOUNTER — Encounter: Payer: Self-pay | Admitting: Family Medicine

## 2023-01-05 ENCOUNTER — Other Ambulatory Visit: Payer: Self-pay

## 2023-01-05 DIAGNOSIS — F1190 Opioid use, unspecified, uncomplicated: Secondary | ICD-10-CM | POA: Diagnosis not present

## 2023-01-05 DIAGNOSIS — M150 Primary generalized (osteo)arthritis: Secondary | ICD-10-CM

## 2023-01-05 MED ORDER — OXYCODONE HCL 20 MG PO TABS: 20.0000 mg | ORAL_TABLET | Freq: Four times a day (QID) | ORAL | 0 refills | Status: DC | PRN

## 2023-01-05 NOTE — Progress Notes (Signed)
Subjective:    Patient ID: [name], female    DOB: May 17, 1955, 67 y.o.   MRN: [id number]  HPI Virtual Visit via Video Note  I connected with the patient on 01/05/23 at  3:30 PM EST by a video enabled telemedicine application and verified that I am speaking with the correct person using two identifiers.  Location patient: home Location provider:work or home office Persons participating in the virtual visit: patient, provider  I discussed the limitations of evaluation and management by telemedicine and the availability of in person appointments. The patient expressed understanding and agreed to proceed.   HPI: Here for pain management. She is doing about the same as far as pain control. She is still trying to get her foot healed.    ROS: See pertinent positives and negatives per HPI.  Past Medical History:  Diagnosis Date   ADHD (attention deficit hyperactivity disorder)    Anemia    Anxiety    ARDS (adult respiratory distress syndrome) (HCC) 2007   Arthritis    Depression    Gallstones 01/07/2018   Ganglion cyst of dorsum of right wrist    Hepatitis    2004 non detectable now   MRSA (methicillin resistant Staphylococcus aureus) 2007   Osteoporosis    Pneumonia 2009   Prosthetic eye globe    right eye    Raynaud's disease     Past Surgical History:  Procedure Laterality Date   ABDOMINAL AORTOGRAM W/LOWER EXTREMITY N/A 11/19/2022   Procedure: ABDOMINAL AORTOGRAM W/LOWER EXTREMITY;  Surgeon: Cephus Shelling, MD;  Location: [facility];  Service: Cardiovascular;  Laterality: N/A;   ABDOMINAL HYSTERECTOMY     ANTERIOR CERVICAL DECOMP/DISCECTOMY FUSION N/A 12/12/2014   Procedure: ANTERIOR CERVICAL DECOMPRESSION/DISCECTOMY FUSION 3 LEVELS;  Surgeon: Estill Bamberg, MD;  Location: [facility];  Service: Orthopedics;  Laterality: N/A;  Anterior cervical decompression fusion, cerivcal 5-6, cervical 6-7, cervical 7-thoracic 1 with instrumentation and  allografrt   APLIGRAFT PLACEMENT Right 04/16/2022   Procedure: APPLICATION OF SKIN SUBSTITUTE GRAFT;  Surgeon: Terance Hart, MD;  Location: [facility] OR;  Service: Orthopedics;  Laterality: Right;   APPENDECTOMY  1974   APPLICATION OF WOUND VAC Left 07/22/2022   Procedure: APPLICATION OF WOUND VAC AND MYRIAD;  Surgeon: Peggye Form, DO;  Location: [facility];  Service: Plastics;  Laterality: Left;   CATARACT EXTRACTION Left 11/2018   CHOLECYSTECTOMY N/A 01/07/2018   Procedure: LAPAROSCOPIC CHOLECYSTECTOMY WITH INTRAOPERATIVE CHOLANGIOGRAM;  Surgeon: Claud Kelp, MD;  Location: [facility] OR;  Service: General;  Laterality: N/A;   COLONOSCOPY  04/14/2013   per Dr. Christella Hartigan, clear, repeat in 10 yrs    CORNEAL TRANSPLANT Right 2007   x4   ENUCLEATION Right 2013   eye infection   GANGLION CYST EXCISION Right 05/25/2016   Procedure: RIGHT WRIST ULNAR GANGLION EXCISION;  Surgeon: Mack Hook, MD;  Location: [facility];  Service: Orthopedics;  Laterality: Right;   HARDWARE REMOVAL Right 08/20/2022   Procedure: HARDWARE REMOVAL;  Surgeon: Terance Hart, MD;  Location: [facility] OR;  Service: Orthopedics;  Laterality: Right;   HEMATOMA EVACUATION Left 07/22/2022   Procedure: EVACUATION HEMATOMA LEFT BACK WITH EXCISION OF WOUND;  Surgeon: Peggye Form, DO;  Location: [facility];  Service: Plastics;  Laterality: Left;   I & D EXTREMITY Right 05/28/2022   Procedure: right ankle wound debridement with myriad placement;  Surgeon: Peggye Form, DO;  Location: [facility];  Service: Plastics;  Laterality: Right;  I & D EXTREMITY Right 08/20/2022   Procedure: IRRIGATION AND DEBRIDEMENT EXTREMITY WITH WOUND VAC PLACEMENT;  Surgeon: Terance Hart, MD;  Location: [facility] OR;  Service: Orthopedics;  Laterality: Right;   I & D EXTREMITY Right 11/26/2022   Procedure: EXCISIONAL DEBRIDEMENT OF NONHEALING RIGHT LEG WOUND, REPAIR OF WOUND DEHISCENCE, INCISION AND DRAINAGE OF  DISTAL LEG ABSCESS, RIGHT FIBULAR SAUCERIZATION, APPLICATION OF SKIN GRAFT SUBSTITUTE, WOUND VAC PLACEMENT;  Surgeon: Terance Hart, MD;  Location: [facility];  Service: Orthopedics;  Laterality: Right;   INCISION AND DRAINAGE Right 07/22/2022   Procedure: INCISION AND DRAINAGE RIGHT ANKLE;  Surgeon: Peggye Form, DO;  Location: [facility];  Service: Plastics;  Laterality: Right;   IR CHEST FLUORO  12/04/2022   IR PATIENT EVAL TECH 0-60 MINS  12/04/2022   MINOR HARDWARE REMOVAL Right 04/16/2022   Procedure: REPAIR OF SURGICAL WOUND DEHISCENCE RIGHT ANKLE, REMOVAL OF DEEP ORTHOPEDIC HARDWARE;  Surgeon: Terance Hart, MD;  Location: [facility];  Service: Orthopedics;  Laterality: Right;   ORIF CLAVICULAR FRACTURE Left 02/03/2022   Procedure: OPEN REDUCTION INTERNAL FIXATION (ORIF) LEFT CLAVICULAR FRACTURE WITH CORACOLAVICULAR FIXATION;  Surgeon: Jones Broom, MD;  Location: [facility];  Service: Orthopedics;  Laterality: Left;  BIOMET ZIP LOOP WITH CLAVICLE PLATES   ORIF FIBULA FRACTURE Right 02/03/2022   Procedure: OPEN TREATMENT OF RIGHT PILON ANKLE FRACTURE INCLUDING LATERAL MALLEOLUS FRACTURE;  Surgeon: Terance Hart, MD;  Location: [facility] OR;  Service: Orthopedics;  Laterality: Right;  LENGTH OF SURGERY: 120 MINUTES   REVERSE SHOULDER ARTHROPLASTY Right 07/10/2021   Procedure: REVERSE SHOULDER ARTHROPLASTY;  Surgeon: Jones Broom, MD;  Location: [facility];  Service: Orthopedics;  Laterality: Right;   RHINOPLASTY  1980   ROBOTIC ASSISTED LAPAROSCOPIC SACROCOLPOPEXY Bilateral 06/13/2020   Procedure: XI ROBOTIC ASSISTED LAPAROSCOPIC SACROCOLPOPEXY AND SUPRACERVICAL HYSTERECTOMY AND BILATERAL  SALPINGO OOPHERECTOMY;  Surgeon: Crist Fat, MD;  Location: [facility];  Service: Urology;  Laterality: Bilateral;   SYNDESMOSIS REPAIR Right 02/03/2022   Procedure: POSSIBLE OPEN TREATMENT RIGHT SYNDESMOSIS;  Surgeon: Terance Hart, MD;  Location: [facility] OR;  Service: Orthopedics;  Laterality: Right;    TONSILLECTOMY     removed in 3rd grade   TOTAL SHOULDER ARTHROPLASTY Left 03/13/2019   Procedure: TOTAL SHOULDER ARTHROPLASTY;  Surgeon: Jones Broom, MD;  Location: [facility];  Service: Orthopedics;  Laterality: Left;    Family History  Problem Relation Age of Onset   Breast cancer Maternal Aunt 60   Colon cancer Neg Hx      Current Outpatient Medications:    amphetamine-dextroamphetamine (ADDERALL XR) 20 MG 24 hr capsule, Take 1 capsule (20 mg total) by mouth every morning., Disp: 30 capsule, Rfl: 0   [START ON 01/29/2023] amphetamine-dextroamphetamine (ADDERALL XR) 20 MG 24 hr capsule, Take 1 capsule (20 mg total) by mouth every morning., Disp: 30 capsule, Rfl: 0   [START ON 03/01/2023] amphetamine-dextroamphetamine (ADDERALL XR) 20 MG 24 hr capsule, Take 1 capsule (20 mg total) by mouth every morning., Disp: 30 capsule, Rfl: 0   celecoxib (CELEBREX) 200 MG capsule, Take 1 capsule (200 mg total) by mouth 2 (two) times daily., Disp: 180 capsule, Rfl: 3   cetirizine (ZYRTEC) 10 MG tablet, Take 10 mg by mouth daily., Disp: , Rfl:    ciprofloxacin (CIPRO) 750 MG tablet, Take 1 tablet (750 mg total) by mouth 2 (two) times daily., Disp: 28 tablet, Rfl: 0   cycloSPORINE (RESTASIS) 0.05 % ophthalmic emulsion, Place 1 drop into both eyes  2 (two) times daily., Disp: 60 each, Rfl: 11   estradiol (ESTRACE VAGINAL) 0.1 MG/GM vaginal cream, Apply a fingertip amount to urethra 2 times per week. (Patient taking differently: Place 1 Applicatorful vaginally once a week.), Disp: 42.5 g, Rfl: 3   linezolid (ZYVOX) 600 MG tablet, Take 1 tablet (600 mg total) by mouth 2 (two) times daily., Disp: 28 tablet, Rfl: 0   Multiple Vitamins-Minerals (MULTIVITAMIN WITH MINERALS) tablet, Take 1 tablet by mouth daily. With folic acid, Disp: , Rfl:    nystatin (MYCOSTATIN) 100000 UNIT/ML suspension, Take 5 mLs (500,000 Units total) by mouth 4 (four) times daily. (Patient taking differently: Take 5 mLs by  mouth daily as needed (thrush).), Disp: 473 mL, Rfl: 2   ondansetron (ZOFRAN) 8 MG tablet, Take 1 tablet (8 mg total) by mouth every 8 (eight) hours as needed for nausea or vomiting., Disp: 20 tablet, Rfl: 3   Oxycodone HCl 20 MG TABS, Take 1 tablet (20 mg total) by mouth every 6 (six) hours as needed (pain)., Disp: 120 tablet, Rfl: 0   Oxycodone HCl 20 MG TABS, Take 1 tablet (20 mg total) by mouth every 6 (six) hours as needed (pain)., Disp: 120 tablet, Rfl: 0   Oxycodone HCl 20 MG TABS, Take 1 tablet (20 mg total) by mouth every 6 (six) hours as needed (pain)., Disp: 120 tablet, Rfl: 0   polyethylene glycol powder (GLYCOLAX/MIRALAX) powder, TAKE 17 GRAMS BY MOUTH 2 TIMES DAILY AS NEEDED. (Patient taking differently: Take 17 g by mouth as needed for moderate constipation.), Disp: 3162 g, Rfl: 1   pregabalin (LYRICA) 300 MG capsule, Take 1 capsule (300 mg total) by mouth 2 (two) times daily., Disp: 60 capsule, Rfl: 5   senna-docusate (SENOKOT-S) 8.6-50 MG tablet, Take 1 tablet by mouth 2 (two) times daily., Disp: 60 tablet, Rfl: 0   valACYclovir (VALTREX) 1000 MG tablet, Take 1 tablet (1,000 mg total) by mouth 3 (three) times daily. (Patient taking differently: Take 1,000 mg by mouth daily.), Disp: 30 tablet, Rfl: 1   verapamil (CALAN-SR) 120 MG CR tablet, Take 1 tablet (120 mg total) by mouth at bedtime., Disp: 90 tablet, Rfl: 0  EXAM:  VITALS per patient if applicable:  GENERAL: alert, oriented, appears well and in no acute distress  HEENT: atraumatic, conjunttiva clear, no obvious abnormalities on inspection of external nose and ears  NECK: normal movements of the head and neck  LUNGS: on inspection no signs of respiratory distress, breathing rate appears normal, no obvious gross SOB, gasping or wheezing  CV: no obvious cyanosis  MS: moves all visible extremities without noticeable abnormality  PSYCH/NEURO: pleasant and cooperative, no obvious depression or anxiety, speech and thought  processing grossly intact  ASSESSMENT AND PLAN: Pain management. Indication for chronic opioid: OA Medication and dose: Oxycodone 20 mg  # pills per month: 120 Last UDS date: 05-27-22 Opioid Treatment Agreement signed (Y/N): 04-30-17 Opioid Treatment Agreement last reviewed with patient:  01-05-23 [facility] reviewed this encounter (include red flags): Yes Meds were refilled.  Gershon Crane, MD  Discussed the following assessment and plan:  No diagnosis found.     I discussed the assessment and treatment plan with the patient. The patient was provided an opportunity to ask questions and all were answered. The patient agreed with the plan and demonstrated an understanding of the instructions.   The patient was advised to call back or seek an in-person evaluation if the symptoms worsen or if the condition fails to improve as  anticipated.      Review of Systems     Objective:   Physical Exam        Assessment & Plan:

## 2023-01-06 ENCOUNTER — Encounter: Payer: Self-pay | Admitting: Internal Medicine

## 2023-01-07 ENCOUNTER — Ambulatory Visit: Payer: Commercial Managed Care - PPO | Admitting: Internal Medicine

## 2023-01-11 ENCOUNTER — Telehealth: Payer: Self-pay | Admitting: *Deleted

## 2023-01-11 NOTE — Telephone Encounter (Signed)
Received on (01/01/2023) via of fax Physician Order from [facility].  Requesting signature,date,and return.  Given to provider to complete.    Physician Order signed and faxed back to [facility].  Confirmation received and copy scanned into the chart.//AB/CMA

## 2023-01-12 ENCOUNTER — Ambulatory Visit: Payer: Commercial Managed Care - PPO | Admitting: Plastic Surgery

## 2023-01-12 VITALS — BP 136/93 | HR 58

## 2023-01-12 DIAGNOSIS — S81801D Unspecified open wound, right lower leg, subsequent encounter: Secondary | ICD-10-CM | POA: Diagnosis not present

## 2023-01-12 NOTE — Progress Notes (Signed)
   Subjective:    Patient ID: [name], female    DOB: 1955-10-15, 67 y.o.   MRN: [id number]  The patient is a 67 year old female here for follow-up on her right leg and left back area.  Her right leg wound is 2.8 x 1.1 x 0.2 cm distally and proximally 2.9 x 1.9 x 0.3 cm.  There does not appear to be any sign of infection.  The back wound is healing very nicely its about 1 x 2 mm in size.  No sign of infection.  The patient is still in a splint and was told to continue that.  I encouraged her to reach out to Dr. Susa Simmonds to find out how long she needs to wear the splint.      Review of Systems  Constitutional: Negative.   Eyes: Negative.   Respiratory: Negative.    Cardiovascular: Negative.   Gastrointestinal: Negative.   Endocrine: Negative.        Objective:   Physical Exam Constitutional:      Appearance: Normal appearance.  HENT:     Head: Atraumatic.  Cardiovascular:     Rate and Rhythm: Normal rate.     Pulses: Normal pulses.  Musculoskeletal:        General: Tenderness present.  Skin:    General: Skin is warm.     Capillary Refill: Capillary refill takes less than 2 seconds.     Findings: Bruising and lesion present.  Neurological:     Mental Status: She is alert and oriented to person, place, and time.  Psychiatric:        Mood and Affect: Mood normal.        Behavior: Behavior normal.        Thought Content: Thought content normal.        Judgment: Judgment normal.        Assessment & Plan:     ICD-10-CM   1. Wound of right lower extremity, subsequent encounter  S81.801D        Debridement done to right leg with curette.  Donated myriad sheet was applied to the right leg wound.  Pictures were obtained of the patient and placed in the chart with the patient's or guardian's permission.
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
## 2023-01-25 ENCOUNTER — Ambulatory Visit: Payer: Commercial Managed Care - PPO | Attending: Surgical

## 2023-01-25 DIAGNOSIS — T148XXA Other injury of unspecified body region, initial encounter: Secondary | ICD-10-CM | POA: Insufficient documentation

## 2023-01-25 DIAGNOSIS — M6281 Muscle weakness (generalized): Secondary | ICD-10-CM | POA: Insufficient documentation

## 2023-01-25 DIAGNOSIS — M25571 Pain in right ankle and joints of right foot: Secondary | ICD-10-CM | POA: Diagnosis not present

## 2023-01-25 DIAGNOSIS — R293 Abnormal posture: Secondary | ICD-10-CM | POA: Insufficient documentation

## 2023-01-25 DIAGNOSIS — M25674 Stiffness of right foot, not elsewhere classified: Secondary | ICD-10-CM | POA: Insufficient documentation

## 2023-01-25 DIAGNOSIS — S31000D Unspecified open wound of lower back and pelvis without penetration into retroperitoneum, subsequent encounter: Secondary | ICD-10-CM | POA: Insufficient documentation

## 2023-01-25 DIAGNOSIS — M5459 Other low back pain: Secondary | ICD-10-CM | POA: Diagnosis not present

## 2023-01-26 ENCOUNTER — Ambulatory Visit (INDEPENDENT_AMBULATORY_CARE_PROVIDER_SITE_OTHER): Payer: Commercial Managed Care - PPO | Admitting: Internal Medicine

## 2023-01-26 ENCOUNTER — Encounter: Payer: Self-pay | Admitting: Internal Medicine

## 2023-01-26 ENCOUNTER — Other Ambulatory Visit: Payer: Self-pay

## 2023-01-26 VITALS — BP 133/77 | HR 72 | Resp 16 | Ht 61.0 in | Wt 109.0 lb

## 2023-01-26 DIAGNOSIS — M869 Osteomyelitis, unspecified: Secondary | ICD-10-CM | POA: Diagnosis not present

## 2023-01-26 DIAGNOSIS — M86171 Other acute osteomyelitis, right ankle and foot: Secondary | ICD-10-CM | POA: Diagnosis not present

## 2023-01-26 NOTE — Patient Instructions (Signed)
Things look good Please get labs today  See me in 3-4 weeks again

## 2023-01-26 NOTE — Progress Notes (Signed)
   Subjective:    Patient ID: [name], female    DOB: [birth year], 67 y.o.   MRN: [id number]  HPI [name] is here for follow up of osteomyelitis.   She developed hardware-associated osteomyelitis with subsequent removal and treated with cefazolin  for 6 weeks but with ongoing open non-healing wound and found to have further osteomyeltis with operative cultures with Enterobacter and Corynebacterium.  She has been on prolonged vancomycin  and cefepime  planned through December 15th.  Vancomycin  trough noted to be elevated but was done after the infusion.   01/26/23 id assessment Patient last saw dr [name] for new OM. Right foot wound cx noted. Finished cefepime /vanc 01/10/23 She appears to described thrush while on this and took nystatin  spit/squish and things got better (oral sore/ulcer/sensitivity) and resolved now The foot wound is healing. Wound vac removed a few weeks ago No pain Bulky dressing present today -- reviewed picture from last night taken 01/25/23    Review of Systems  Constitutional:  Negative for chills, fatigue and fever.  Gastrointestinal:  Negative for diarrhea and nausea.  Skin:  Negative for rash.       Objective:  General/constitutional: no distress, pleasant HEENT: Normocephalic, PER, Conj Clear, EOMI, Oropharynx clear Neck supple CV: rrr no mrg Lungs: clear to auscultation, normal respiratory effort Abd: Soft, Nontender Ext: no edema Skin: No Rash Neuro: nonfocal MSK: no peripheral joint swelling/tenderness/warmth; back spines nontender  Skin: in bulky dressing but 12/30 picture showed 2 open wounds. Upper healthy granulating tissue; lower with collagen in it and also appears healthy without cellulitis changes in surrounding          Assessment & Plan:    Relapsed/new om of the foot. Previous hardware removed Finished vanc/cefepime  01/10/23 Thrush description while on abx resolved  Wound healing  Tried to access [facility] unable  to Labs today  F/u 3-4 weeks -- sooner if pain/pus/redness/swelling at foot

## 2023-01-27 LAB — COMPLETE METABOLIC PANEL WITH GFR
AG Ratio: 1.3 (calc) (ref 1.0–2.5)
ALT: 17 U/L (ref 6–29)
AST: 24 U/L (ref 10–35)
Albumin: 4.4 g/dL (ref 3.6–5.1)
Alkaline phosphatase (APISO): 81 U/L (ref 37–153)
BUN/Creatinine Ratio: 16 (calc) (ref 6–22)
BUN: 18 mg/dL (ref 7–25)
CO2: 28 mmol/L (ref 20–32)
Calcium: 9.4 mg/dL (ref 8.6–10.4)
Chloride: 102 mmol/L (ref 98–110)
Creat: 1.1 mg/dL — ABNORMAL HIGH (ref 0.50–1.05)
Globulin: 3.5 g/dL (ref 1.9–3.7)
Glucose, Bld: 96 mg/dL (ref 65–99)
Potassium: 3.9 mmol/L (ref 3.5–5.3)
Sodium: 141 mmol/L (ref 135–146)
Total Bilirubin: 0.6 mg/dL (ref 0.2–1.2)
Total Protein: 7.9 g/dL (ref 6.1–8.1)
eGFR: 56 mL/min/{1.73_m2} — ABNORMAL LOW (ref >=60)

## 2023-01-27 LAB — CBC
HCT: 33.9 % — ABNORMAL LOW (ref 35.0–45.0)
Hemoglobin: 11.2 g/dL — ABNORMAL LOW (ref 11.7–15.5)
MCH: 30.5 pg (ref 27.0–33.0)
MCHC: 33 g/dL (ref 32.0–36.0)
MCV: 92.4 fL (ref 80.0–100.0)
MPV: 11.9 fL (ref 7.5–12.5)
Platelets: 140 10*3/uL (ref 140–400)
RBC: 3.67 10*6/uL — ABNORMAL LOW (ref 3.80–5.10)
RDW: 12.8 % (ref 11.0–15.0)
WBC: 4.1 10*3/uL (ref 3.8–10.8)

## 2023-01-27 LAB — C-REACTIVE PROTEIN: CRP: <3 mg/L (ref <8.0)

## 2023-02-01 ENCOUNTER — Other Ambulatory Visit (HOSPITAL_COMMUNITY): Payer: Self-pay

## 2023-02-01 ENCOUNTER — Ambulatory Visit: Payer: Medicare Other | Attending: Surgical

## 2023-02-01 DIAGNOSIS — M25571 Pain in right ankle and joints of right foot: Secondary | ICD-10-CM | POA: Diagnosis present

## 2023-02-01 DIAGNOSIS — M5459 Other low back pain: Secondary | ICD-10-CM | POA: Insufficient documentation

## 2023-02-01 DIAGNOSIS — M25674 Stiffness of right foot, not elsewhere classified: Secondary | ICD-10-CM | POA: Insufficient documentation

## 2023-02-01 DIAGNOSIS — M6281 Muscle weakness (generalized): Secondary | ICD-10-CM | POA: Insufficient documentation

## 2023-02-01 DIAGNOSIS — R293 Abnormal posture: Secondary | ICD-10-CM | POA: Diagnosis present
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
## 2023-02-02 ENCOUNTER — Other Ambulatory Visit: Payer: Self-pay

## 2023-02-02 ENCOUNTER — Ambulatory Visit: Payer: Commercial Managed Care - PPO | Admitting: Surgical

## 2023-02-02 ENCOUNTER — Encounter: Payer: Self-pay | Admitting: Internal Medicine

## 2023-02-02 VITALS — BP 132/80 | HR 80

## 2023-02-02 DIAGNOSIS — S31829A Unspecified open wound of left buttock, initial encounter: Secondary | ICD-10-CM

## 2023-02-02 DIAGNOSIS — S81801D Unspecified open wound, right lower leg, subsequent encounter: Secondary | ICD-10-CM

## 2023-02-02 DIAGNOSIS — S31000D Unspecified open wound of lower back and pelvis without penetration into retroperitoneum, subsequent encounter: Secondary | ICD-10-CM
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
## 2023-02-04 ENCOUNTER — Encounter: Payer: Self-pay | Admitting: Physical Therapy

## 2023-02-04 ENCOUNTER — Ambulatory Visit: Payer: Medicare Other | Admitting: Physical Therapy

## 2023-02-04 DIAGNOSIS — R293 Abnormal posture: Secondary | ICD-10-CM

## 2023-02-04 DIAGNOSIS — M5459 Other low back pain: Secondary | ICD-10-CM

## 2023-02-04 DIAGNOSIS — M25571 Pain in right ankle and joints of right foot: Secondary | ICD-10-CM

## 2023-02-04 DIAGNOSIS — M25674 Stiffness of right foot, not elsewhere classified: Secondary | ICD-10-CM

## 2023-02-04 DIAGNOSIS — M6281 Muscle weakness (generalized): Secondary | ICD-10-CM
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
## 2023-02-08 ENCOUNTER — Ambulatory Visit: Payer: Medicare Other

## 2023-02-08 DIAGNOSIS — M5459 Other low back pain: Secondary | ICD-10-CM | POA: Diagnosis not present

## 2023-02-08 DIAGNOSIS — M6281 Muscle weakness (generalized): Secondary | ICD-10-CM

## 2023-02-08 DIAGNOSIS — R293 Abnormal posture: Secondary | ICD-10-CM
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
## 2023-02-10 ENCOUNTER — Ambulatory Visit: Payer: Medicare Other

## 2023-02-12 ENCOUNTER — Other Ambulatory Visit: Payer: Self-pay

## 2023-02-15 ENCOUNTER — Ambulatory Visit: Payer: Commercial Managed Care - PPO

## 2023-02-15 DIAGNOSIS — M5459 Other low back pain: Secondary | ICD-10-CM | POA: Diagnosis not present

## 2023-02-15 DIAGNOSIS — R293 Abnormal posture: Secondary | ICD-10-CM

## 2023-02-15 DIAGNOSIS — M6281 Muscle weakness (generalized): Secondary | ICD-10-CM
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
## 2023-02-17 ENCOUNTER — Ambulatory Visit: Payer: Commercial Managed Care - PPO

## 2023-02-17 ENCOUNTER — Other Ambulatory Visit: Payer: Self-pay

## 2023-02-17 DIAGNOSIS — M5459 Other low back pain: Secondary | ICD-10-CM

## 2023-02-17 DIAGNOSIS — M25674 Stiffness of right foot, not elsewhere classified: Secondary | ICD-10-CM

## 2023-02-17 DIAGNOSIS — M6281 Muscle weakness (generalized): Secondary | ICD-10-CM

## 2023-02-17 DIAGNOSIS — R293 Abnormal posture: Secondary | ICD-10-CM

## 2023-02-18 ENCOUNTER — Encounter: Payer: Self-pay | Admitting: Internal Medicine

## 2023-02-18 ENCOUNTER — Ambulatory Visit (INDEPENDENT_AMBULATORY_CARE_PROVIDER_SITE_OTHER): Payer: Medicare Other | Admitting: Internal Medicine

## 2023-02-18 ENCOUNTER — Other Ambulatory Visit: Payer: Self-pay

## 2023-02-18 VITALS — BP 139/85 | HR 85 | Ht 61.0 in | Wt 110.0 lb

## 2023-02-18 DIAGNOSIS — M869 Osteomyelitis, unspecified: Secondary | ICD-10-CM | POA: Diagnosis present

## 2023-02-18 NOTE — Patient Instructions (Signed)
As your wound continues to heal and your lab looks good previously, will repeat another and if this is normal you do not need to see [place] again   Follow up as needed if the wound starts increasing in size, have purulence, your experienced local pain/redness or fever, chill

## 2023-02-18 NOTE — Progress Notes (Signed)
   Subjective:    Patient ID: [name], female    DOB: 10/09/1955, 68 y.o.   MRN: [id number]  HPI [name] is here for follow up of osteomyelitis.   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  She has been on prolonged vancomycin and cefepime planned through December 15th.  Vancomycin trough noted to be elevated but was done after the infusion.   01/26/23 id assessment Patient last saw dr Luciana Axe for new OM. Right foot wound cx noted. Finished cefepime/vanc 01/10/23 She appears to described thrush while on this and took nystatin spit/squish and things got better (oral sore/ulcer/sensitivity) and resolved now The foot wound is healing. Wound vac removed a few weeks ago No pain Bulky dressing present today -- reviewed picture from last night taken 01/25/23    02/18/23 id clinic f/u See a&p    Review of Systems  Constitutional:  Negative for chills, fatigue and fever.  Gastrointestinal:  Negative for diarrhea and nausea.  Skin:  Negative for rash.       Objective:  General/constitutional: no distress, pleasant HEENT: Normocephalic, PER, Conj Clear, EOMI, Oropharynx clear Neck supple CV: rrr no mrg Lungs: clear to auscultation, normal respiratory effort Abd: Soft, Nontender Ext: no edema Skin: No Rash Neuro: nonfocal MSK: no peripheral joint swelling/tenderness/warmth; back spines nontender  Skin: in bulky dressing but 12/30 picture showed 2 open wounds. Upper healthy granulating tissue; lower with collagen in it and also appears healthy without cellulitis changes in surrounding    02/18/23 picture Continue to improve in size/appearance Good granulating tissue lower wound; top wound collagen intact    Labs: Lab Results  Component Value Date   WBC 4.1  01/26/2023   HGB 11.2 (L) 01/26/2023   HCT 33.9 (L) 01/26/2023   MCV 92.4 01/26/2023   PLT 140 01/26/2023   Last metabolic panel Lab Results  Component Value Date   GLUCOSE 96 01/26/2023   NA 141 01/26/2023   K 3.9 01/26/2023   CL 102 01/26/2023   CO2 28 01/26/2023   BUN 18 01/26/2023   CREATININE 1.10 (H) 01/26/2023   EGFR 56 (L) 01/26/2023   CALCIUM 9.4 01/26/2023   PHOS 4.7 (H) 08/22/2022   PROT 7.9 01/26/2023   ALBUMIN 4.3 11/24/2022   BILITOT 0.6 01/26/2023   ALKPHOS 88 11/24/2022   AST 24 01/26/2023   ALT 17 01/26/2023   ANIONGAP 7 11/24/2022   Lab Results  Component Value Date   CRP <3.0 01/26/2023         Assessment & Plan:    Relapsed/new om of the foot. Previous hardware removed Finished vanc/cefepime 01/10/23 Thrush description while on abx resolved  Wound healing  Tried to access [facility] unable to Labs today  F/u 3-4 weeks -- sooner if pain/pus/redness/swelling at foot   02/18/23 id clinic assessment Crp normal off abx when checked 12/31 Open wound healing well -- much improved from last visit; no purulence/pain; size improved Recheck crp today -- if normal and wound continues to heal no need for f/u  F/u as needed of purulence/redness/increased pain or fever chill

## 2023-02-19 LAB — C-REACTIVE PROTEIN: CRP: 3.8 mg/L (ref <8.0)

## 2023-02-22 ENCOUNTER — Encounter: Payer: Self-pay | Admitting: Internal Medicine

## 2023-02-22 ENCOUNTER — Ambulatory Visit: Payer: Medicare Other

## 2023-02-22 DIAGNOSIS — M5459 Other low back pain: Secondary | ICD-10-CM

## 2023-02-22 DIAGNOSIS — R293 Abnormal posture: Secondary | ICD-10-CM

## 2023-02-22 DIAGNOSIS — M6281 Muscle weakness (generalized): Secondary | ICD-10-CM
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
## 2023-02-23 ENCOUNTER — Encounter: Payer: Self-pay | Admitting: Surgical

## 2023-02-23 ENCOUNTER — Ambulatory Visit (INDEPENDENT_AMBULATORY_CARE_PROVIDER_SITE_OTHER): Payer: Medicare Other | Admitting: Surgical

## 2023-02-23 ENCOUNTER — Encounter: Payer: Self-pay | Admitting: Internal Medicine

## 2023-02-23 DIAGNOSIS — S31000D Unspecified open wound of lower back and pelvis without penetration into retroperitoneum, subsequent encounter: Secondary | ICD-10-CM

## 2023-02-23 DIAGNOSIS — S91001D Unspecified open wound, right ankle, subsequent encounter: Secondary | ICD-10-CM | POA: Diagnosis not present

## 2023-02-23 DIAGNOSIS — S81801D Unspecified open wound, right lower leg, subsequent encounter: Secondary | ICD-10-CM | POA: Diagnosis not present
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
## 2023-02-24 ENCOUNTER — Other Ambulatory Visit (HOSPITAL_COMMUNITY): Payer: Self-pay

## 2023-02-24 ENCOUNTER — Other Ambulatory Visit: Payer: Self-pay | Admitting: Medical Genetics

## 2023-02-24 ENCOUNTER — Ambulatory Visit: Payer: Medicare Other

## 2023-02-24 DIAGNOSIS — M5459 Other low back pain: Secondary | ICD-10-CM

## 2023-02-24 DIAGNOSIS — M25571 Pain in right ankle and joints of right foot: Secondary | ICD-10-CM

## 2023-02-24 DIAGNOSIS — R293 Abnormal posture: Secondary | ICD-10-CM

## 2023-02-24 DIAGNOSIS — M25674 Stiffness of right foot, not elsewhere classified: Secondary | ICD-10-CM

## 2023-02-24 DIAGNOSIS — M6281 Muscle weakness (generalized): Secondary | ICD-10-CM
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
## 2023-02-26 ENCOUNTER — Other Ambulatory Visit: Payer: Self-pay | Admitting: Family Medicine

## 2023-02-26 ENCOUNTER — Other Ambulatory Visit (HOSPITAL_COMMUNITY): Payer: Self-pay

## 2023-02-26 ENCOUNTER — Encounter: Payer: Self-pay | Admitting: Internal Medicine

## 2023-02-27 ENCOUNTER — Other Ambulatory Visit (HOSPITAL_COMMUNITY): Payer: Self-pay

## 2023-02-27 ENCOUNTER — Encounter: Payer: Self-pay | Admitting: Internal Medicine

## 2023-03-01 ENCOUNTER — Other Ambulatory Visit: Payer: Self-pay

## 2023-03-01 ENCOUNTER — Other Ambulatory Visit (HOSPITAL_COMMUNITY): Payer: Self-pay

## 2023-03-01 ENCOUNTER — Encounter: Payer: Self-pay | Admitting: Internal Medicine

## 2023-03-01 ENCOUNTER — Ambulatory Visit: Payer: Medicare Other | Attending: Surgical

## 2023-03-01 DIAGNOSIS — R293 Abnormal posture: Secondary | ICD-10-CM | POA: Diagnosis present

## 2023-03-01 DIAGNOSIS — M6281 Muscle weakness (generalized): Secondary | ICD-10-CM | POA: Insufficient documentation

## 2023-03-01 DIAGNOSIS — M5459 Other low back pain: Secondary | ICD-10-CM | POA: Insufficient documentation

## 2023-03-01 DIAGNOSIS — M25674 Stiffness of right foot, not elsewhere classified: Secondary | ICD-10-CM | POA: Diagnosis present

## 2023-03-01 DIAGNOSIS — M25571 Pain in right ankle and joints of right foot: Secondary | ICD-10-CM | POA: Insufficient documentation
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
## 2023-03-02 ENCOUNTER — Other Ambulatory Visit (HOSPITAL_COMMUNITY): Payer: Self-pay

## 2023-03-02 ENCOUNTER — Other Ambulatory Visit: Payer: Self-pay

## 2023-03-02 MED ORDER — VERAPAMIL HCL ER 120 MG PO TBCR
120.0000 mg | EXTENDED_RELEASE_TABLET | Freq: Every day | ORAL | 0 refills | Status: DC
  Filled 2023-03-02 – 2023-03-08 (×2): qty 90, 90d supply, fill #0

## 2023-03-03 ENCOUNTER — Ambulatory Visit: Payer: Medicare Other

## 2023-03-03 DIAGNOSIS — M5459 Other low back pain: Secondary | ICD-10-CM

## 2023-03-03 DIAGNOSIS — R293 Abnormal posture: Secondary | ICD-10-CM | POA: Diagnosis not present

## 2023-03-03 DIAGNOSIS — M25571 Pain in right ankle and joints of right foot: Secondary | ICD-10-CM

## 2023-03-03 DIAGNOSIS — M6281 Muscle weakness (generalized): Secondary | ICD-10-CM

## 2023-03-03 DIAGNOSIS — M25674 Stiffness of right foot, not elsewhere classified: Secondary | ICD-10-CM
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
## 2023-03-08 ENCOUNTER — Ambulatory Visit: Payer: Medicare Other

## 2023-03-08 ENCOUNTER — Other Ambulatory Visit: Payer: Self-pay

## 2023-03-08 ENCOUNTER — Other Ambulatory Visit (HOSPITAL_COMMUNITY): Payer: Self-pay

## 2023-03-08 ENCOUNTER — Other Ambulatory Visit: Payer: Self-pay | Admitting: Family Medicine

## 2023-03-08 DIAGNOSIS — R293 Abnormal posture: Secondary | ICD-10-CM | POA: Diagnosis not present

## 2023-03-08 DIAGNOSIS — M6281 Muscle weakness (generalized): Secondary | ICD-10-CM

## 2023-03-08 DIAGNOSIS — M5459 Other low back pain: Secondary | ICD-10-CM
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
## 2023-03-09 ENCOUNTER — Other Ambulatory Visit (HOSPITAL_COMMUNITY): Payer: Self-pay

## 2023-03-10 ENCOUNTER — Encounter: Payer: Self-pay | Admitting: Family Medicine

## 2023-03-11 ENCOUNTER — Other Ambulatory Visit (HOSPITAL_COMMUNITY): Payer: Self-pay

## 2023-03-11 ENCOUNTER — Ambulatory Visit: Payer: Medicare Other

## 2023-03-11 ENCOUNTER — Encounter (HOSPITAL_COMMUNITY): Payer: Self-pay

## 2023-03-11 ENCOUNTER — Encounter: Payer: Self-pay | Admitting: Internal Medicine

## 2023-03-11 ENCOUNTER — Other Ambulatory Visit: Payer: Self-pay | Admitting: Family Medicine

## 2023-03-11 DIAGNOSIS — M6281 Muscle weakness (generalized): Secondary | ICD-10-CM

## 2023-03-11 DIAGNOSIS — M25571 Pain in right ankle and joints of right foot: Secondary | ICD-10-CM

## 2023-03-11 DIAGNOSIS — R293 Abnormal posture: Secondary | ICD-10-CM

## 2023-03-11 DIAGNOSIS — M5459 Other low back pain: Secondary | ICD-10-CM

## 2023-03-11 DIAGNOSIS — M25674 Stiffness of right foot, not elsewhere classified: Secondary | ICD-10-CM
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
## 2023-03-12 ENCOUNTER — Other Ambulatory Visit: Payer: Self-pay

## 2023-03-12 ENCOUNTER — Other Ambulatory Visit (HOSPITAL_COMMUNITY): Payer: Self-pay

## 2023-03-12 MED ORDER — PREGABALIN 300 MG PO CAPS
300.0000 mg | ORAL_CAPSULE | Freq: Two times a day (BID) | ORAL | 5 refills | Status: DC
  Filled 2023-03-12: qty 60, 30d supply, fill #0
  Filled 2023-04-06 – 2023-04-09 (×2): qty 60, 30d supply, fill #1
  Filled 2023-05-17: qty 60, 30d supply, fill #2
  Filled 2023-06-23: qty 60, 30d supply, fill #3
  Filled 2023-07-23: qty 60, 30d supply, fill #4
  Filled 2023-09-02 – 2023-09-21 (×2): qty 60, 30d supply, fill #5

## 2023-03-12 NOTE — Telephone Encounter (Signed)
Pt has sent message and would like to get refill today lyrica 300 mg [place] out[facility] pharm
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
## 2023-03-15 ENCOUNTER — Ambulatory Visit: Payer: Medicare Other

## 2023-03-15 DIAGNOSIS — R293 Abnormal posture: Secondary | ICD-10-CM | POA: Diagnosis not present

## 2023-03-15 DIAGNOSIS — M6281 Muscle weakness (generalized): Secondary | ICD-10-CM

## 2023-03-15 DIAGNOSIS — M5459 Other low back pain: Secondary | ICD-10-CM

## 2023-03-17 ENCOUNTER — Other Ambulatory Visit (HOSPITAL_COMMUNITY): Payer: Medicare Other

## 2023-03-17 ENCOUNTER — Ambulatory Visit: Payer: Commercial Managed Care - PPO

## 2023-03-17 ENCOUNTER — Other Ambulatory Visit (HOSPITAL_COMMUNITY)
Admission: RE | Admit: 2023-03-17 | Discharge: 2023-03-17 | Disposition: A | Discharge status: Home / Self Care | Payer: Self-pay | Source: Ambulatory Visit | Attending: Oncology | Admitting: Oncology
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
## 2023-03-22 ENCOUNTER — Ambulatory Visit: Payer: Medicare Other

## 2023-03-22 DIAGNOSIS — R293 Abnormal posture: Secondary | ICD-10-CM

## 2023-03-22 DIAGNOSIS — M6281 Muscle weakness (generalized): Secondary | ICD-10-CM

## 2023-03-22 DIAGNOSIS — M5459 Other low back pain: Secondary | ICD-10-CM

## 2023-03-22 NOTE — Telephone Encounter (Signed)
 Done

## 2023-03-24 ENCOUNTER — Encounter: Payer: Self-pay | Admitting: Family Medicine

## 2023-03-24 ENCOUNTER — Ambulatory Visit (INDEPENDENT_AMBULATORY_CARE_PROVIDER_SITE_OTHER): Payer: Medicare Other | Admitting: Family Medicine

## 2023-03-24 ENCOUNTER — Ambulatory Visit: Payer: Commercial Managed Care - PPO

## 2023-03-24 VITALS — BP 124/78 | HR 73 | Temp 97.4°F | Wt 110.0 lb

## 2023-03-24 DIAGNOSIS — F1190 Opioid use, unspecified, uncomplicated: Secondary | ICD-10-CM | POA: Diagnosis not present

## 2023-03-24 DIAGNOSIS — M150 Primary generalized (osteo)arthritis: Secondary | ICD-10-CM

## 2023-03-24 MED ORDER — OXYCODONE HCL 20 MG PO TABS: 20.0000 mg | ORAL_TABLET | Freq: Four times a day (QID) | ORAL | 0 refills | Status: DC | PRN

## 2023-03-24 MED ORDER — AMPHETAMINE-DEXTROAMPHET ER 20 MG PO CP24: 20.0000 mg | ORAL_CAPSULE | ORAL | 0 refills | Status: DC

## 2023-03-24 MED ORDER — TOBRAMYCIN-DEXAMETHASONE 0.3-0.1 % OP SUSP: 2.0000 [drp] | OPHTHALMIC | 0 refills | Status: AC

## 2023-03-24 MED ORDER — OXYCODONE HCL 20 MG PO TABS
20.0000 mg | ORAL_TABLET | Freq: Four times a day (QID) | ORAL | 0 refills | Status: DC | PRN
Start: 2023-03-24 — End: 2023-06-02

## 2023-03-24 MED ORDER — DIAZEPAM 5 MG PO TABS: 5.0000 mg | ORAL_TABLET | Freq: Three times a day (TID) | ORAL | 5 refills | Status: DC | PRN

## 2023-03-24 NOTE — Progress Notes (Signed)
   Subjective:    Patient ID: [name], female    DOB: 1955/02/14, 68 y.o.   MRN: [id number]  HPI Here for pain management. She is doing well. She is currently getting PT for her back pain.    Review of Systems  Constitutional: Negative.   Musculoskeletal:  Positive for arthralgias and back pain.       Objective:   Physical Exam Constitutional:      Appearance: Normal appearance.     Comments: Walks without assistive devices   Neurological:     Mental Status: She is alert.           Assessment & Plan:  Pain management.  Indication for chronic opioid: OA Medication and dose: Oxycodone 20 mg  # pills per month: 120 Last UDS date: 05-27-22 Opioid Treatment Agreement signed (Y/N): 04-30-17 Opioid Treatment Agreement last reviewed with patient:  03-24-23 [facility] reviewed this encounter (include red flags): Yes Meds were refilled.  Gershon Crane, MD

## 2023-03-28 LAB — GENECONNECT MOLECULAR SCREEN: Genetic Analysis Overall Interpretation: NEGATIVE

## 2023-03-28 LAB — DRUG MONITOR, PANEL 1, W/CONF, URINE
Alphahydroxyalprazolam: NEGATIVE ng/mL (ref <25)
Alphahydroxymidazolam: NEGATIVE ng/mL (ref <50)
Alphahydroxytriazolam: NEGATIVE ng/mL (ref <50)
Aminoclonazepam: NEGATIVE ng/mL (ref <25)
Amphetamine: 3804 ng/mL — ABNORMAL HIGH (ref <250)
Amphetamines: POSITIVE ng/mL — AB (ref <500)
Barbiturates: NEGATIVE ng/mL (ref <300)
Benzodiazepines: NEGATIVE ng/mL (ref <100)
Cocaine Metabolite: NEGATIVE ng/mL (ref <150)
Codeine: NEGATIVE ng/mL (ref <50)
Creatinine: 38.6 mg/dL (ref >=20.0)
Hydrocodone: NEGATIVE ng/mL (ref <50)
Hydromorphone: NEGATIVE ng/mL (ref <50)
Hydroxyethylflurazepam: NEGATIVE ng/mL (ref <50)
Lorazepam: NEGATIVE ng/mL (ref <50)
Marijuana Metabolite: NEGATIVE ng/mL (ref <20)
Methadone Metabolite: NEGATIVE ng/mL (ref <100)
Methamphetamine: NEGATIVE ng/mL (ref <250)
Morphine: NEGATIVE ng/mL (ref <50)
Nordiazepam: NEGATIVE ng/mL (ref <50)
Norhydrocodone: NEGATIVE ng/mL (ref <50)
Noroxycodone: 2484 ng/mL — ABNORMAL HIGH (ref <50)
Opiates: NEGATIVE ng/mL (ref <100)
Oxazepam: NEGATIVE ng/mL (ref <50)
Oxidant: NEGATIVE ug/mL (ref <200)
Oxycodone: 5480 ng/mL — ABNORMAL HIGH (ref <50)
Oxycodone: POSITIVE ng/mL — AB (ref <100)
Oxymorphone: 2426 ng/mL — ABNORMAL HIGH (ref <50)
Phencyclidine: NEGATIVE ng/mL (ref <25)
Temazepam: NEGATIVE ng/mL (ref <50)
pH: 7.3 (ref 4.5–9.0)

## 2023-03-28 LAB — DM TEMPLATE
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
## 2023-03-30 ENCOUNTER — Ambulatory Visit: Payer: Medicare Other | Attending: Surgical

## 2023-03-30 DIAGNOSIS — M5459 Other low back pain: Secondary | ICD-10-CM | POA: Insufficient documentation

## 2023-03-30 DIAGNOSIS — M25571 Pain in right ankle and joints of right foot: Secondary | ICD-10-CM | POA: Diagnosis present

## 2023-03-30 DIAGNOSIS — R293 Abnormal posture: Secondary | ICD-10-CM | POA: Insufficient documentation

## 2023-03-30 DIAGNOSIS — M6281 Muscle weakness (generalized): Secondary | ICD-10-CM | POA: Insufficient documentation

## 2023-03-30 DIAGNOSIS — M25674 Stiffness of right foot, not elsewhere classified: Secondary | ICD-10-CM | POA: Diagnosis present
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
## 2023-04-01 ENCOUNTER — Ambulatory Visit: Payer: Medicare Other

## 2023-04-01 DIAGNOSIS — M5459 Other low back pain: Secondary | ICD-10-CM

## 2023-04-01 DIAGNOSIS — R293 Abnormal posture: Secondary | ICD-10-CM

## 2023-04-01 DIAGNOSIS — M6281 Muscle weakness (generalized): Secondary | ICD-10-CM

## 2023-04-02 ENCOUNTER — Encounter: Payer: Self-pay | Admitting: Internal Medicine

## 2023-04-02 ENCOUNTER — Other Ambulatory Visit (HOSPITAL_COMMUNITY): Payer: Self-pay

## 2023-04-05 ENCOUNTER — Ambulatory Visit: Payer: Medicare Other

## 2023-04-05 DIAGNOSIS — M6281 Muscle weakness (generalized): Secondary | ICD-10-CM

## 2023-04-05 DIAGNOSIS — R293 Abnormal posture: Secondary | ICD-10-CM

## 2023-04-05 DIAGNOSIS — M5459 Other low back pain: Secondary | ICD-10-CM
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
## 2023-04-06 ENCOUNTER — Ambulatory Visit: Payer: Medicare Other | Admitting: Surgical

## 2023-04-06 ENCOUNTER — Other Ambulatory Visit (HOSPITAL_COMMUNITY): Payer: Self-pay

## 2023-04-06 VITALS — BP 146/71 | HR 108

## 2023-04-06 DIAGNOSIS — S81801D Unspecified open wound, right lower leg, subsequent encounter: Secondary | ICD-10-CM

## 2023-04-06 DIAGNOSIS — S31000D Unspecified open wound of lower back and pelvis without penetration into retroperitoneum, subsequent encounter: Secondary | ICD-10-CM
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
## 2023-04-07 ENCOUNTER — Ambulatory Visit: Payer: Medicare Other

## 2023-04-07 ENCOUNTER — Other Ambulatory Visit (HOSPITAL_COMMUNITY): Payer: Self-pay

## 2023-04-07 DIAGNOSIS — R293 Abnormal posture: Secondary | ICD-10-CM

## 2023-04-07 DIAGNOSIS — M5459 Other low back pain: Secondary | ICD-10-CM

## 2023-04-07 DIAGNOSIS — M6281 Muscle weakness (generalized): Secondary | ICD-10-CM
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
## 2023-04-12 ENCOUNTER — Ambulatory Visit: Payer: Medicare Other

## 2023-04-12 DIAGNOSIS — M5459 Other low back pain: Secondary | ICD-10-CM

## 2023-04-12 DIAGNOSIS — M6281 Muscle weakness (generalized): Secondary | ICD-10-CM

## 2023-04-12 DIAGNOSIS — R293 Abnormal posture: Secondary | ICD-10-CM | POA: Diagnosis not present
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
## 2023-04-14 ENCOUNTER — Ambulatory Visit: Payer: Medicare Other

## 2023-04-21 ENCOUNTER — Emergency Department (HOSPITAL_COMMUNITY)

## 2023-04-21 ENCOUNTER — Emergency Department (HOSPITAL_COMMUNITY)
Admission: EM | Admit: 2023-04-21 | Discharge: 2023-04-21 | Disposition: A | Discharge status: Home / Self Care | Attending: Emergency Medicine | Admitting: Emergency Medicine

## 2023-04-21 ENCOUNTER — Other Ambulatory Visit: Payer: Self-pay

## 2023-04-21 ENCOUNTER — Encounter (HOSPITAL_COMMUNITY): Payer: Self-pay

## 2023-04-21 DIAGNOSIS — M25552 Pain in left hip: Secondary | ICD-10-CM | POA: Diagnosis not present

## 2023-04-21 DIAGNOSIS — R531 Weakness: Secondary | ICD-10-CM | POA: Diagnosis present

## 2023-04-21 LAB — BASIC METABOLIC PANEL
Anion gap: 9 (ref 5–15)
BUN: 20 mg/dL (ref 8–23)
CO2: 28 mmol/L (ref 22–32)
Calcium: 9.3 mg/dL (ref 8.9–10.3)
Chloride: 100 mmol/L (ref 98–111)
Creatinine, Ser: 0.64 mg/dL (ref 0.44–1.00)
GFR, Estimated: >60 mL/min (ref >60)
Glucose, Bld: 96 mg/dL (ref 70–99)
Potassium: 3.2 mmol/L — ABNORMAL LOW (ref 3.5–5.1)
Sodium: 137 mmol/L (ref 135–145)

## 2023-04-21 LAB — CBC
HCT: 43.7 % (ref 36.0–46.0)
Hemoglobin: 14.1 g/dL (ref 12.0–15.0)
MCH: 31.1 pg (ref 26.0–34.0)
MCHC: 32.3 g/dL (ref 30.0–36.0)
MCV: 96.5 fL (ref 80.0–100.0)
Platelets: 187 10*3/uL (ref 150–400)
RBC: 4.53 MIL/uL (ref 3.87–5.11)
RDW: 12.7 % (ref 11.5–15.5)
WBC: 5.7 10*3/uL (ref 4.0–10.5)
nRBC: 0 % (ref 0.0–0.2)

## 2023-04-21 LAB — CBG MONITORING, ED: Glucose-Capillary: 88 mg/dL (ref 70–99)

## 2023-04-21 MED ORDER — METHYLPREDNISOLONE 4 MG PO TBPK: ORAL_TABLET | ORAL | 0 refills | Status: DC
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
## 2023-04-26 ENCOUNTER — Other Ambulatory Visit (HOSPITAL_COMMUNITY): Payer: Self-pay

## 2023-04-28 ENCOUNTER — Ambulatory Visit

## 2023-05-12 ENCOUNTER — Ambulatory Visit (INDEPENDENT_AMBULATORY_CARE_PROVIDER_SITE_OTHER): Admitting: Surgical

## 2023-05-12 VITALS — BP 126/75 | HR 87

## 2023-05-12 DIAGNOSIS — S81801D Unspecified open wound, right lower leg, subsequent encounter: Secondary | ICD-10-CM | POA: Diagnosis not present

## 2023-05-12 DIAGNOSIS — S82891G Other fracture of right lower leg, subsequent encounter for closed fracture with delayed healing: Secondary | ICD-10-CM
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
## 2023-05-12 NOTE — Addendum Note (Signed)
 Addended by: Ali Antonio on: 05/12/2023 02:28 PM   Modules accepted: Orders

## 2023-05-17 ENCOUNTER — Other Ambulatory Visit (HOSPITAL_COMMUNITY): Payer: Self-pay

## 2023-05-18 ENCOUNTER — Ambulatory Visit
Admission: EM | Admit: 2023-05-18 | Discharge: 2023-05-18 | Disposition: A | Discharge status: Home / Self Care | Attending: Nurse Practitioner | Admitting: Nurse Practitioner

## 2023-05-18 ENCOUNTER — Other Ambulatory Visit: Payer: Self-pay

## 2023-05-18 ENCOUNTER — Encounter: Payer: Self-pay | Admitting: Emergency Medicine

## 2023-05-18 DIAGNOSIS — S01111A Laceration without foreign body of right eyelid and periocular area, initial encounter: Secondary | ICD-10-CM

## 2023-05-18 DIAGNOSIS — S80211A Abrasion, right knee, initial encounter: Secondary | ICD-10-CM

## 2023-05-18 DIAGNOSIS — W19XXXA Unspecified fall, initial encounter: Secondary | ICD-10-CM

## 2023-05-18 DIAGNOSIS — S80212A Abrasion, left knee, initial encounter: Secondary | ICD-10-CM | POA: Diagnosis not present

## 2023-05-18 NOTE — ED Triage Notes (Signed)
 Pt presents after she fell today while walking dog. She hit face and has eyebrow laceration Denies LOC. Not taking blood thinners. She has right prosthetic eye.

## 2023-05-18 NOTE — Discharge Instructions (Addendum)
 You were seen today for a cut which is also known as a laceration.  A laceration is a type of injury that can go through all layers of the skin and sometimes into the tissue underneath. Some cuts can heal on their own, but others need to be closed to help them heal properly and reduce the risk of infection.  Your cut was treated with stitches. Keep the dressing on for the next 24 hours and do not let it get wet. After 24 hours, you can remove the dressing. Clean the area gently once a day using warm water  and antibacterial soap. After cleaning, pat the area dry with a clean towel. Do not rub the wound, scratch it, or pick at it. Avoid using disinfectants, antiseptics, ointments, creams, or liquid medicines on the wound.  Keep the area covered with a non-stick dressing for the next couple of days. If you're at home and not doing anything that could get the wound dirty, you can leave it uncovered, as air helps promote healing.  Please return here or follow up with your healthcare provider in one week to have the stitches removed. If you notice increased redness, swelling, pus, or worsening pain, contact your provider right away.

## 2023-05-18 NOTE — ED Provider Notes (Signed)
 [facility]    CSN: [id number] Arrival date & time: 05/18/23  1915      History   Chief Complaint Chief Complaint  Patient presents with   Facial Injury    Right eyebrow    HPI [name] is a 68 y.o. female.   Subjective:  [name] is a 68 year old female who presents for evaluation of a laceration to the lateral aspect of the right eyebrow. The injury occurred shortly before her arrival to urgent care. The patient reports she was walking her dog, who weighs a little over 50 pounds, when the dog suddenly lunged toward another dog. While trying to hold onto the leash, she was overpowered by the dog's movement and fell. She denies any loss of consciousness, dizziness, headache, nausea, vomiting, vision changes, or neck pain. She was able to get up independently and walked approximately one mile back home before coming to the clinic. In addition to the eyebrow laceration, she has superficial abrasions to both knees. She denies any other injuries and reports that her tetanus vaccination is up to date.  The following portions of the patient's history were reviewed and updated as appropriate: allergies, current medications, past family history, past medical history, past social history, past surgical history, and problem list.       Past Medical History:  Diagnosis Date   ADHD (attention deficit hyperactivity disorder)    Anemia    Anxiety    ARDS (adult respiratory distress syndrome) (HCC) 2007   Arthritis    Depression    Gallstones 01/07/2018   Ganglion cyst of dorsum of right wrist    Hepatitis    2004 non detectable now   MRSA (methicillin resistant Staphylococcus aureus) 2007   Osteoporosis    Pneumonia 2009   Prosthetic eye globe    right eye    Raynaud's disease     Patient Active Problem List   Diagnosis Date Noted   Leg wound, right 11/26/2022   PAD (peripheral artery disease) (HCC) 11/17/2022   PICC (peripherally  inserted central catheter) in place 09/29/2022   Medication monitoring encounter 09/29/2022   Cellulitis of right ankle 08/15/2022   Open wound of lower back and pelvis w/o penentrat into retroperitoneum, subsequent encounter 08/14/2022   Osteomyelitis of right ankle (HCC) 08/14/2022   HTN (hypertension) 08/14/2022   Pelvic fracture (HCC) 07/23/2022   Hematoma and contusion 07/22/2022   Right ankle pain 04/16/2022   OA (osteoarthritis) 02/23/2022   Closed right ankle fracture 02/03/2022   Prolapse of female pelvic organs 06/13/2020   Bladder prolapse, female, acquired 02/02/2020   Raynaud's disease without gangrene 02/10/2019   Acute pain of left knee 03/23/2018   Gallstones 01/07/2018   ADHD (attention deficit hyperactivity disorder), inattentive type 11/24/2016   Onychomycosis due to dermatophyte 08/23/2015   Liver fibrosis 04/02/2015   Radiculopathy 12/12/2014   Other specified anemias 10/11/2014   Substance abuse in remission (HCC) 08/28/2014   Chronic hepatitis C without hepatic coma (HCC) 07/06/2014   MRSA PNEUMONIA 10/01/2008   MUSCULOSKELETAL PAIN 09/27/2008   ALLERGIC RHINITIS 04/12/2008   CELLULITIS AND ABSCESS OF FACE 04/12/2008    Past Surgical History:  Procedure Laterality Date   ABDOMINAL AORTOGRAM W/LOWER EXTREMITY N/A 11/19/2022   Procedure: ABDOMINAL AORTOGRAM W/LOWER EXTREMITY;  Surgeon: Young Hensen, MD;  Location: [facility];  Service: Cardiovascular;  Laterality: N/A;   ABDOMINAL HYSTERECTOMY     ANTERIOR CERVICAL DECOMP/DISCECTOMY FUSION N/A 12/12/2014   Procedure: ANTERIOR CERVICAL  DECOMPRESSION/DISCECTOMY FUSION 3 LEVELS;  Surgeon: Virl Grimes, MD;  Location: [facility];  Service: Orthopedics;  Laterality: N/A;  Anterior cervical decompression fusion, cerivcal 5-6, cervical 6-7, cervical 7-thoracic 1 with instrumentation and allografrt   APLIGRAFT PLACEMENT Right 04/16/2022   Procedure: APPLICATION OF SKIN SUBSTITUTE GRAFT;  Surgeon: Donnamarie Gables, MD;  Location: [facility] OR;  Service: Orthopedics;  Laterality: Right;   APPENDECTOMY  1974   APPLICATION OF WOUND VAC Left 07/22/2022   Procedure: APPLICATION OF WOUND VAC AND MYRIAD;  Surgeon: Thornell Flirt, DO;  Location: [facility];  Service: Plastics;  Laterality: Left;   CATARACT EXTRACTION Left 11/2018   CHOLECYSTECTOMY N/A 01/07/2018   Procedure: LAPAROSCOPIC CHOLECYSTECTOMY WITH INTRAOPERATIVE CHOLANGIOGRAM;  Surgeon: Boyce Byes, MD;  Location: [facility] OR;  Service: General;  Laterality: N/A;   COLONOSCOPY  04/14/2013   per Dr. Howard Macho, clear, repeat in 10 yrs    CORNEAL TRANSPLANT Right 2007   x4   ENUCLEATION Right 2013   eye infection   GANGLION CYST EXCISION Right 05/25/2016   Procedure: RIGHT WRIST ULNAR GANGLION EXCISION;  Surgeon: Rober Chimera, MD;  Location: [facility];  Service: Orthopedics;  Laterality: Right;   HARDWARE REMOVAL Right 08/20/2022   Procedure: HARDWARE REMOVAL;  Surgeon: Donnamarie Gables, MD;  Location: [facility] OR;  Service: Orthopedics;  Laterality: Right;   HEMATOMA EVACUATION Left 07/22/2022   Procedure: EVACUATION HEMATOMA LEFT BACK WITH EXCISION OF WOUND;  Surgeon: Thornell Flirt, DO;  Location: [facility];  Service: Plastics;  Laterality: Left;   I & D EXTREMITY Right 05/28/2022   Procedure: right ankle wound debridement with myriad placement;  Surgeon: Thornell Flirt, DO;  Location: [facility];  Service: Plastics;  Laterality: Right;   I & D EXTREMITY Right 08/20/2022   Procedure: IRRIGATION AND DEBRIDEMENT EXTREMITY WITH WOUND VAC PLACEMENT;  Surgeon: Donnamarie Gables, MD;  Location: [facility] OR;  Service: Orthopedics;  Laterality: Right;   I & D EXTREMITY Right 11/26/2022   Procedure: EXCISIONAL DEBRIDEMENT OF NONHEALING RIGHT LEG WOUND, REPAIR OF WOUND DEHISCENCE, INCISION AND DRAINAGE OF DISTAL LEG ABSCESS, RIGHT FIBULAR SAUCERIZATION, APPLICATION OF SKIN GRAFT SUBSTITUTE, WOUND VAC PLACEMENT;  Surgeon:  Donnamarie Gables, MD;  Location: [facility];  Service: Orthopedics;  Laterality: Right;   INCISION AND DRAINAGE Right 07/22/2022   Procedure: INCISION AND DRAINAGE RIGHT ANKLE;  Surgeon: Thornell Flirt, DO;  Location: [facility];  Service: Plastics;  Laterality: Right;   IR CHEST FLUORO  12/04/2022   IR PATIENT EVAL TECH 0-60 MINS  12/04/2022   MINOR HARDWARE REMOVAL Right 04/16/2022   Procedure: REPAIR OF SURGICAL WOUND DEHISCENCE RIGHT ANKLE, REMOVAL OF DEEP ORTHOPEDIC HARDWARE;  Surgeon: Donnamarie Gables, MD;  Location: [facility];  Service: Orthopedics;  Laterality: Right;   ORIF CLAVICULAR FRACTURE Left 02/03/2022   Procedure: OPEN REDUCTION INTERNAL FIXATION (ORIF) LEFT CLAVICULAR FRACTURE WITH CORACOLAVICULAR FIXATION;  Surgeon: Sammye Cristal, MD;  Location: [facility];  Service: Orthopedics;  Laterality: Left;  BIOMET ZIP LOOP WITH CLAVICLE PLATES   ORIF FIBULA FRACTURE Right 02/03/2022   Procedure: OPEN TREATMENT OF RIGHT PILON ANKLE FRACTURE INCLUDING LATERAL MALLEOLUS FRACTURE;  Surgeon: Donnamarie Gables, MD;  Location: [facility] OR;  Service: Orthopedics;  Laterality: Right;  LENGTH OF SURGERY: 120 MINUTES   REVERSE SHOULDER ARTHROPLASTY Right 07/10/2021   Procedure: REVERSE SHOULDER ARTHROPLASTY;  Surgeon: Sammye Cristal, MD;  Location: [facility];  Service: Orthopedics;  Laterality: Right;   RHINOPLASTY  1980   ROBOTIC  ASSISTED LAPAROSCOPIC SACROCOLPOPEXY Bilateral 06/13/2020   Procedure: XI ROBOTIC ASSISTED LAPAROSCOPIC SACROCOLPOPEXY AND SUPRACERVICAL HYSTERECTOMY AND BILATERAL  SALPINGO OOPHERECTOMY;  Surgeon: Andrez Banker, MD;  Location: [facility];  Service: Urology;  Laterality: Bilateral;   SYNDESMOSIS REPAIR Right 02/03/2022   Procedure: POSSIBLE OPEN TREATMENT RIGHT SYNDESMOSIS;  Surgeon: Donnamarie Gables, MD;  Location: [facility] OR;  Service: Orthopedics;  Laterality: Right;   TONSILLECTOMY     removed in 3rd grade   TOTAL SHOULDER ARTHROPLASTY Left 03/13/2019   Procedure: TOTAL SHOULDER  ARTHROPLASTY;  Surgeon: Sammye Cristal, MD;  Location: [facility];  Service: Orthopedics;  Laterality: Left;    OB History   No obstetric history on file.      Home Medications    Prior to Admission medications   Medication Sig Start Date End Date Taking? Authorizing Provider  amphetamine -dextroamphetamine  (ADDERALL  XR) 20 MG 24 hr capsule Take 1 capsule (20 mg total) by mouth every morning. 03/24/23   Donley Furth, MD  amphetamine -dextroamphetamine  (ADDERALL  XR) 20 MG 24 hr capsule Take 1 capsule (20 mg total) by mouth every morning. 03/24/23   Donley Furth, MD  amphetamine -dextroamphetamine  (ADDERALL  XR) 20 MG 24 hr capsule Take 1 capsule (20 mg total) by mouth every morning. 03/24/23   Donley Furth, MD  celecoxib  (CELEBREX ) 200 MG capsule Take 1 capsule (200 mg total) by mouth 2 (two) times daily. 11/05/22   Donley Furth, MD  cetirizine (ZYRTEC) 10 MG tablet Take 10 mg by mouth daily.    [provider]  cycloSPORINE  (RESTASIS ) 0.05 % ophthalmic emulsion Place 1 drop into both eyes 2 (two) times daily. 12/10/22     diazepam  (VALIUM ) 5 MG tablet Take 1 tablet (5 mg total) by mouth every 8 (eight) hours as needed for anxiety. 03/24/23   Donley Furth, MD  estradiol  (ESTRACE  VAGINAL) 0.1 MG/GM vaginal cream Apply a fingertip amount to urethra 2 times per week. Patient taking differently: Place 1 Applicatorful vaginally once a week. 09/30/21     methylPREDNISolone  (MEDROL  DOSEPAK) 4 MG TBPK tablet Follow package insert 04/21/23   Curatolo, Adam, DO  Multiple Vitamins-Minerals (MULTIVITAMIN WITH MINERALS) tablet Take 1 tablet by mouth daily. With folic acid    [provider]  nystatin  (MYCOSTATIN ) 100000 UNIT/ML suspension Take 5 mLs (500,000 Units total) by mouth 4 (four) times daily. 05/27/22   Donley Furth, MD  ondansetron  (ZOFRAN ) 8 MG tablet Take 1 tablet (8 mg total) by mouth every 8 (eight) hours as needed for nausea or vomiting. 12/08/22   Vu,  Garnette Ka T, MD  Oxycodone  HCl 20 MG TABS Take 1 tablet (20 mg total) by mouth every 6 (six) hours as needed (pain). 03/24/23   Donley Furth, MD  Oxycodone  HCl 20 MG TABS Take 1 tablet (20 mg total) by mouth every 6 (six) hours as needed (pain). 03/24/23   Donley Furth, MD  Oxycodone  HCl 20 MG TABS Take 1 tablet (20 mg total) by mouth every 6 (six) hours as needed (pain). 03/24/23   Donley Furth, MD  polyethylene glycol powder (GLYCOLAX /MIRALAX ) powder TAKE 17 GRAMS BY MOUTH 2 TIMES DAILY AS NEEDED. Patient taking differently: Take 17 g by mouth as needed for moderate constipation. 02/02/17   Donley Furth, MD  pregabalin  (LYRICA ) 300 MG capsule Take 1 capsule (300 mg total) by mouth 2 (two) times daily. 03/12/23   Burchette, Marijean Shouts, MD  senna-docusate (SENOKOT-S) 8.6-50 MG tablet Take 1 tablet by  mouth 2 (two) times daily. 08/22/22   Aura Leeds Latif, DO  tobramycin -dexamethasone  (TOBRADEX ) ophthalmic solution Place 2 drops into both eyes every 4 (four) hours while awake. 03/24/23   Donley Furth, MD  valACYclovir  (VALTREX ) 1000 MG tablet Take 1 tablet (1,000 mg total) by mouth 3 (three) times daily. Patient taking differently: Take 1,000 mg by mouth daily. 08/03/22   Donley Furth, MD  verapamil  (CALAN -SR) 120 MG CR tablet Take 1 tablet (120 mg total) by mouth at bedtime. 03/02/23   Donley Furth, MD    Family History Family History  Problem Relation Age of Onset   Breast cancer Maternal Aunt 60   Colon cancer Neg Hx     Social History Social History   Tobacco Use   Smoking status: Never   Smokeless tobacco: Never  Vaping Use   Vaping status: Never Used  Substance Use Topics   Alcohol  use: Not Currently    Comment: occ   Drug use: No     Allergies   Patient has no known allergies.   Review of Systems Review of Systems  HENT:  Negative for dental problem.   Eyes:  Negative for visual disturbance.  Respiratory:  Negative for shortness of breath.   Cardiovascular:  Negative  for chest pain and palpitations.  Gastrointestinal:  Negative for nausea and vomiting.  Musculoskeletal:  Negative for arthralgias, back pain, gait problem, joint swelling, myalgias and neck pain.  Skin:  Positive for wound.  Neurological:  Negative for dizziness, weakness, numbness and headaches.  All other systems reviewed and are negative.    Physical Exam Triage Vital Signs ED Triage Vitals  Encounter Vitals Group     BP 05/18/23 1933 (!) 143/93     Systolic BP Percentile --      Diastolic BP Percentile --      Pulse Rate 05/18/23 1933 (!) 120     Resp 05/18/23 1933 17     Temp 05/18/23 1933 97.9 F (36.6 C)     Temp Source 05/18/23 1933 Oral     SpO2 05/18/23 1933 97 %     Weight --      Height --      Head Circumference --      Peak Flow --      Pain Score 05/18/23 1940 3     Pain Loc --      Pain Education --      Exclude from Growth Chart --    No data found.  Updated Vital Signs BP (!) 143/93 (BP Location: Right Arm)   Pulse (!) 120   Temp 97.9 F (36.6 C) (Oral)   Resp 17   SpO2 97%   Visual Acuity Right Eye Distance:   Left Eye Distance:   Bilateral Distance:    Right Eye Near:   Left Eye Near:    Bilateral Near:     Physical Exam Vitals reviewed.  Constitutional:      General: She is awake. She is not in acute distress.    Appearance: Normal appearance. She is well-developed and well-groomed. She is not ill-appearing, toxic-appearing or diaphoretic.  HENT:     Head: Normocephalic.     Right Ear: Hearing normal. No drainage.     Left Ear: Hearing normal. No drainage.     Nose: Nose normal.     Mouth/Throat:     Mouth: Mucous membranes are moist. No injury.     Pharynx: Oropharynx is clear. Uvula midline.  Eyes:     General: Vision grossly intact.     Conjunctiva/sclera: Conjunctivae normal.  Neck:     Trachea: Trachea and phonation normal.  Cardiovascular:     Rate and Rhythm: Normal rate and regular rhythm.     Heart sounds: Normal  heart sounds.  Pulmonary:     Effort: Pulmonary effort is normal.     Breath sounds: Normal breath sounds and air entry.  Abdominal:     General: Bowel sounds are normal. There are no signs of injury.     Palpations: Abdomen is soft.     Tenderness: There is no abdominal tenderness.  Musculoskeletal:        General: Normal range of motion.     Cervical back: Full passive range of motion without pain, normal range of motion and neck supple.  Skin:    General: Skin is warm and dry.     Findings: Signs of injury and laceration present. No bruising or ecchymosis.     Comments: Laceration above the right eyebrow. Abrasions to the bilateral knees.   Neurological:     General: No focal deficit present.     Mental Status: She is alert and oriented to person, place, and time.     Cranial Nerves: No cranial nerve deficit.     Sensory: Sensation is intact. No sensory deficit.     Motor: Motor function is intact. No weakness.     Coordination: Coordination is intact.     Gait: Gait is intact.  Psychiatric:        Behavior: Behavior is cooperative.      UC Treatments / Results  Labs (all labs ordered are listed, but only abnormal results are displayed) Labs Reviewed - No data to display  EKG   Radiology No results found.  Procedures Laceration Repair  Date/Time: 05/18/2023 8:41 PM  Performed by: Maryruth Sol, FNP Authorized by: Maryruth Sol, FNP   Consent:    Consent obtained:  Verbal   Consent given by:  Patient   Risks, benefits, and alternatives were discussed: yes     Risks discussed:  Infection, pain and poor cosmetic result Universal protocol:    Patient identity confirmed:  Verbally with patient and arm band Laceration details:    Location:  Face   Face location:  R eyebrow   Length (cm):  2.5 Pre-procedure details:    Preparation:  Patient was prepped and draped in usual sterile fashion Treatment:    Area cleansed with:  Shur-Clens   Amount of  cleaning:  Standard   Debridement:  None Skin repair:    Repair method:  Sutures   Suture size:  5-0   Suture material:  Prolene   Suture technique:  Simple interrupted   Number of sutures:  5 Approximation:    Approximation:  Close Repair type:    Repair type:  Simple Post-procedure details:    Dressing:  Non-adherent dressing   Procedure completion:  Tolerated well, no immediate complications  (including critical care time)  Medications Ordered in UC Medications - No data to display  Initial Impression / Assessment and Plan / UC Course  I have reviewed the triage vital signs and the nursing notes.  Pertinent labs & imaging results that were available during my care of the patient were reviewed by me and considered in my medical decision making (see chart for details).    68 year old female presenting with a laceration to the lateral aspect of her right eyebrow and abrasions  to both knees after a fall that occurred shortly before her arrival to urgent care. She denies any loss of consciousness, dizziness, headache, nausea, vomiting, vision changes, or neck pain. On presentation, she was alert and oriented. Physical exam was notable for the injuries described and revealed no focal neurological deficits. The laceration was repaired in clinic without immediate complications. Her TDAP is UTD. Wound care instructions and indications for prompt follow-up were reviewed. She was advised to return to urgent care or follow up with her primary care provider in one week for suture removal.  Today's evaluation has revealed no signs of a dangerous process. Discussed diagnosis with patient and/or guardian. Patient and/or guardian aware of their diagnosis, possible red flag symptoms to watch out for and need for close follow up. Patient and/or guardian understands verbal and written discharge instructions. Patient and/or guardian comfortable with plan and disposition.  Patient and/or guardian has a  clear mental status at this time, good insight into illness (after discussion and teaching) and has clear judgment to make decisions regarding their care  Documentation was completed with the aid of voice recognition software. Transcription may contain typographical errors. Final Clinical Impressions(s) / UC Diagnoses   Final diagnoses:  Laceration of right eyebrow without complication, initial encounter  Fall, initial encounter  Abrasion of knee, bilateral     Discharge Instructions      You were seen today for a cut which is also known as a laceration.  A laceration is a type of injury that can go through all layers of the skin and sometimes into the tissue underneath. Some cuts can heal on their own, but others need to be closed to help them heal properly and reduce the risk of infection.  Your cut was treated with stitches. Keep the dressing on for the next 24 hours and do not let it get wet. After 24 hours, you can remove the dressing. Clean the area gently once a day using warm water  and antibacterial soap. After cleaning, pat the area dry with a clean towel. Do not rub the wound, scratch it, or pick at it. Avoid using disinfectants, antiseptics, ointments, creams, or liquid medicines on the wound.  Keep the area covered with a non-stick dressing for the next couple of days. If you're at home and not doing anything that could get the wound dirty, you can leave it uncovered, as air helps promote healing.  Please return here or follow up with your healthcare provider in one week to have the stitches removed. If you notice increased redness, swelling, pus, or worsening pain, contact your provider right away.     ED Prescriptions   None    PDMP not reviewed this encounter.   Maryruth Sol, [place] 05/18/23 2043

## 2023-05-19 ENCOUNTER — Ambulatory Visit: Attending: Surgical

## 2023-06-02 ENCOUNTER — Encounter: Payer: Self-pay | Admitting: Family Medicine

## 2023-06-02 ENCOUNTER — Ambulatory Visit (INDEPENDENT_AMBULATORY_CARE_PROVIDER_SITE_OTHER): Admitting: Family Medicine

## 2023-06-02 VITALS — BP 124/80 | HR 61 | Temp 98.2°F | Wt 109.8 lb

## 2023-06-02 DIAGNOSIS — F1190 Opioid use, unspecified, uncomplicated: Secondary | ICD-10-CM | POA: Diagnosis not present

## 2023-06-02 DIAGNOSIS — M150 Primary generalized (osteo)arthritis: Secondary | ICD-10-CM | POA: Diagnosis not present

## 2023-06-02 MED ORDER — OXYCODONE HCL 20 MG PO TABS: 20.0000 mg | ORAL_TABLET | Freq: Four times a day (QID) | ORAL | 0 refills | Status: DC | PRN

## 2023-06-02 MED ORDER — AMPHETAMINE-DEXTROAMPHET ER 20 MG PO CP24: 20.0000 mg | ORAL_CAPSULE | ORAL | 0 refills | Status: DC

## 2023-06-02 NOTE — Progress Notes (Signed)
   Subjective:    Patient ID: [name], female    DOB: 1955/06/23, 68 y.o.   MRN: [id number]  HPI Here for pain management. She is doing well.    Review of Systems  Constitutional: Negative.   Musculoskeletal:  Positive for arthralgias.       Objective:   Physical Exam Constitutional:      Appearance: Normal appearance.  Neurological:     Mental Status: She is alert.           Assessment & Plan:  Pain management. Indication for chronic opioid: OA Medication and dose: Oxycodone  20 mg  # pills per month: 120 Last UDS date: 03-24-23 Opioid Treatment Agreement signed (Y/N): 04-30-17 Opioid Treatment Agreement last reviewed with patient:   [facility] reviewed this encounter (include red flags): Yes Meds were refilled.  Beverly Diego, MD

## 2023-06-10 ENCOUNTER — Ambulatory Visit: Admitting: Surgical

## 2023-06-24 ENCOUNTER — Other Ambulatory Visit: Payer: Self-pay

## 2023-06-24 ENCOUNTER — Other Ambulatory Visit (HOSPITAL_COMMUNITY): Payer: Self-pay

## 2023-07-13 ENCOUNTER — Ambulatory Visit: Admitting: Student

## 2023-07-13 ENCOUNTER — Encounter: Payer: Self-pay | Admitting: Student

## 2023-07-13 VITALS — BP 151/94 | HR 96

## 2023-07-13 DIAGNOSIS — S81801D Unspecified open wound, right lower leg, subsequent encounter: Secondary | ICD-10-CM | POA: Diagnosis not present

## 2023-07-13 DIAGNOSIS — S31000D Unspecified open wound of lower back and pelvis without penetration into retroperitoneum, subsequent encounter: Secondary | ICD-10-CM | POA: Diagnosis not present
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
## 2023-07-21 ENCOUNTER — Encounter: Payer: Self-pay | Admitting: Family Medicine

## 2023-07-21 ENCOUNTER — Ambulatory Visit (INDEPENDENT_AMBULATORY_CARE_PROVIDER_SITE_OTHER): Admitting: Family Medicine

## 2023-07-21 VITALS — BP 110/80 | HR 68 | Temp 97.9°F | Wt 110.0 lb

## 2023-07-21 DIAGNOSIS — M5416 Radiculopathy, lumbar region: Secondary | ICD-10-CM | POA: Diagnosis not present

## 2023-07-21 NOTE — Progress Notes (Signed)
   Subjective:    Patient ID: [name], female    DOB: 01/11/1956, 68 y.o.   MRN: [id number]  HPI Here to follow up on lumbar radiculopathy and to fill out FMLA forms.  She frequently goes to doctor visits, gets spinal injections, and goes to PT. Sometimes she needs to rest at home when the pain becomes unbearable.    Review of Systems  Constitutional: Negative.   Respiratory: Negative.    Cardiovascular: Negative.   Musculoskeletal:  Positive for back pain.       Objective:   Physical Exam Constitutional:      Appearance: Normal appearance.   Cardiovascular:     Rate and Rhythm: Normal rate and regular rhythm.     Pulses: Normal pulses.     Heart sounds: Normal heart sounds.  Pulmonary:     Effort: Pulmonary effort is normal.     Breath sounds: Normal breath sounds.   Neurological:     Mental Status: She is alert.           Assessment & Plan:  Lumbar radiculopathy. We filled out the forms. She will continue to be in pain management with us . Garnette Olmsted, MD

## 2023-07-23 ENCOUNTER — Other Ambulatory Visit (HOSPITAL_COMMUNITY): Payer: Self-pay

## 2023-07-23 ENCOUNTER — Other Ambulatory Visit: Payer: Self-pay

## 2023-08-17 ENCOUNTER — Ambulatory Visit (INDEPENDENT_AMBULATORY_CARE_PROVIDER_SITE_OTHER): Admitting: Plastic Surgery

## 2023-08-17 ENCOUNTER — Encounter: Payer: Self-pay | Admitting: Family Medicine

## 2023-08-17 ENCOUNTER — Encounter: Payer: Self-pay | Admitting: Plastic Surgery

## 2023-08-17 DIAGNOSIS — M549 Dorsalgia, unspecified: Secondary | ICD-10-CM | POA: Diagnosis not present

## 2023-08-17 DIAGNOSIS — S31000D Unspecified open wound of lower back and pelvis without penetration into retroperitoneum, subsequent encounter: Secondary | ICD-10-CM

## 2023-08-17 DIAGNOSIS — S81801D Unspecified open wound, right lower leg, subsequent encounter: Secondary | ICD-10-CM
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
## 2023-08-31 ENCOUNTER — Encounter: Payer: Self-pay | Admitting: Family Medicine

## 2023-08-31 DIAGNOSIS — N959 Unspecified menopausal and perimenopausal disorder: Secondary | ICD-10-CM

## 2023-08-31 DIAGNOSIS — Z13820 Encounter for screening for osteoporosis: Secondary | ICD-10-CM

## 2023-09-02 ENCOUNTER — Other Ambulatory Visit (HOSPITAL_COMMUNITY): Payer: Self-pay

## 2023-09-02 NOTE — Telephone Encounter (Signed)
 Yes please order the DEXA

## 2023-09-03 ENCOUNTER — Other Ambulatory Visit: Payer: Self-pay

## 2023-09-03 NOTE — Telephone Encounter (Signed)
Refill for Valtrex sent in

## 2023-09-03 NOTE — Telephone Encounter (Signed)
 Patient called to request refill valACYclovir  (VALTREX ) 1 gram tablet  Please send to [id number]   [address]

## 2023-09-08 ENCOUNTER — Ambulatory Visit (INDEPENDENT_AMBULATORY_CARE_PROVIDER_SITE_OTHER): Admitting: Surgical

## 2023-09-08 VITALS — BP 146/75 | HR 78

## 2023-09-08 DIAGNOSIS — T148XXA Other injury of unspecified body region, initial encounter: Secondary | ICD-10-CM

## 2023-09-08 DIAGNOSIS — S31829A Unspecified open wound of left buttock, initial encounter: Secondary | ICD-10-CM

## 2023-09-08 DIAGNOSIS — M545 Low back pain, unspecified: Secondary | ICD-10-CM | POA: Diagnosis not present

## 2023-09-08 DIAGNOSIS — Z87828 Personal history of other (healed) physical injury and trauma: Secondary | ICD-10-CM

## 2023-09-08 DIAGNOSIS — G8929 Other chronic pain: Secondary | ICD-10-CM

## 2023-09-08 DIAGNOSIS — L905 Scar conditions and fibrosis of skin: Secondary | ICD-10-CM
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
## 2023-09-10 ENCOUNTER — Telehealth: Payer: Self-pay | Admitting: Surgical

## 2023-09-10 ENCOUNTER — Other Ambulatory Visit (HOSPITAL_COMMUNITY): Payer: Self-pay

## 2023-09-10 NOTE — Telephone Encounter (Signed)
 Pt called and said she is having real bad back pain, she was sch thurs 21st next week for phone call, I moved it to Wed 20th bc [facility] only had an opening that day. Not sure if someone needs to talk with he sooner,, but she kept saying she was in so much pain in her back.Please advise

## 2023-09-15 ENCOUNTER — Telehealth: Admitting: Surgical

## 2023-09-16 ENCOUNTER — Encounter: Payer: Self-pay | Admitting: Surgical

## 2023-09-16 ENCOUNTER — Ambulatory Visit (INDEPENDENT_AMBULATORY_CARE_PROVIDER_SITE_OTHER): Admitting: Surgical

## 2023-09-16 ENCOUNTER — Telehealth: Admitting: Surgical

## 2023-09-16 DIAGNOSIS — L905 Scar conditions and fibrosis of skin: Secondary | ICD-10-CM

## 2023-09-16 DIAGNOSIS — M545 Low back pain, unspecified: Secondary | ICD-10-CM | POA: Diagnosis not present
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
## 2023-09-21 ENCOUNTER — Other Ambulatory Visit (HOSPITAL_COMMUNITY): Payer: Self-pay

## 2023-09-22 ENCOUNTER — Other Ambulatory Visit: Payer: Self-pay

## 2023-09-25 ENCOUNTER — Other Ambulatory Visit: Payer: Self-pay | Admitting: Family Medicine

## 2023-09-30 ENCOUNTER — Encounter: Payer: Self-pay | Admitting: Family Medicine

## 2023-09-30 ENCOUNTER — Ambulatory Visit (INDEPENDENT_AMBULATORY_CARE_PROVIDER_SITE_OTHER): Admitting: Family Medicine

## 2023-09-30 VITALS — BP 118/64 | HR 89 | Temp 97.8°F | Wt 112.8 lb

## 2023-09-30 DIAGNOSIS — G8929 Other chronic pain: Secondary | ICD-10-CM | POA: Diagnosis not present

## 2023-09-30 DIAGNOSIS — M150 Primary generalized (osteo)arthritis: Secondary | ICD-10-CM | POA: Diagnosis not present

## 2023-09-30 MED ORDER — OXYCODONE HCL 20 MG PO TABS: 20.0000 mg | ORAL_TABLET | Freq: Four times a day (QID) | ORAL | 0 refills | Status: DC | PRN

## 2023-09-30 MED ORDER — AMPHETAMINE-DEXTROAMPHET ER 20 MG PO CP24: 20.0000 mg | ORAL_CAPSULE | ORAL | 0 refills | Status: DC

## 2023-09-30 NOTE — Progress Notes (Signed)
   Subjective:    Patient ID: [name], female    DOB: 29-Jul-1955, 68 y.o.   MRN: [id number]  HPI Here for pain management. She si doing about the same. She is considering moving to [place] in a few months to be near her son and grandchildren.   Review of Systems  Constitutional: Negative.   Musculoskeletal:  Positive for arthralgias.       Objective:   Physical Exam Constitutional:      Appearance: Normal appearance.  Neurological:     Mental Status: She is alert.           Assessment & Plan:  Pain management.  Indication for chronic opioid: OA Medication and dose: Oxycodone  20 mg  # pills per month: 120 Last UDS date: 03-24-23 Opioid Treatment Agreement signed (Y/N): 04-30-17 Opioid Treatment Agreement last reviewed with patient:  09-30-23 NCCSRS reviewed this encounter (include red flags): Yes Meds were refilled.  Garnette Olmsted, MD

## 2023-10-12 ENCOUNTER — Encounter: Payer: Self-pay | Admitting: Plastic Surgery

## 2023-10-12 ENCOUNTER — Ambulatory Visit: Admitting: Plastic Surgery

## 2023-10-12 DIAGNOSIS — M546 Pain in thoracic spine: Secondary | ICD-10-CM | POA: Diagnosis not present

## 2023-10-12 DIAGNOSIS — M549 Dorsalgia, unspecified: Secondary | ICD-10-CM | POA: Insufficient documentation

## 2023-10-12 DIAGNOSIS — G8929 Other chronic pain: Secondary | ICD-10-CM
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
## 2023-10-15 ENCOUNTER — Encounter: Payer: Self-pay | Admitting: Plastic Surgery

## 2023-10-15 NOTE — Addendum Note (Signed)
 Addended by: DEBERA RONAL POUR on: 10/15/2023 04:14 PM   Modules accepted: Orders

## 2023-10-21 ENCOUNTER — Other Ambulatory Visit: Payer: Self-pay | Admitting: Family Medicine

## 2023-10-21 ENCOUNTER — Other Ambulatory Visit (HOSPITAL_COMMUNITY): Payer: Self-pay

## 2023-10-22 ENCOUNTER — Other Ambulatory Visit: Payer: Self-pay

## 2023-10-22 ENCOUNTER — Other Ambulatory Visit: Payer: Self-pay | Admitting: Family Medicine

## 2023-10-22 ENCOUNTER — Other Ambulatory Visit (HOSPITAL_COMMUNITY): Payer: Self-pay

## 2023-10-22 ENCOUNTER — Encounter: Payer: Self-pay | Admitting: Family Medicine

## 2023-10-22 MED ORDER — PREGABALIN 300 MG PO CAPS
300.0000 mg | ORAL_CAPSULE | Freq: Two times a day (BID) | ORAL | 5 refills | Status: AC
  Filled 2023-10-22: qty 60, 30d supply, fill #0
  Filled 2023-11-23 (×4): qty 60, 30d supply, fill #1
  Filled 2024-01-03: qty 60, 30d supply, fill #2
  Filled 2024-02-02: qty 60, 30d supply, fill #3

## 2023-10-28 ENCOUNTER — Ambulatory Visit: Attending: Surgical | Admitting: Physical Therapy

## 2023-11-04 ENCOUNTER — Encounter: Payer: Self-pay | Admitting: Internal Medicine

## 2023-11-04 ENCOUNTER — Other Ambulatory Visit (HOSPITAL_COMMUNITY): Payer: Self-pay

## 2023-11-04 MED ORDER — FLUZONE HIGH-DOSE 0.5 ML IM SUSY
PREFILLED_SYRINGE | Freq: Once | INTRAMUSCULAR | 0 refills | Status: AC
  Filled 2023-11-04: qty 0.5, 1d supply, fill #0

## 2023-11-09 ENCOUNTER — Ambulatory Visit

## 2023-11-17 ENCOUNTER — Other Ambulatory Visit (HOSPITAL_COMMUNITY): Payer: Self-pay

## 2023-11-17 ENCOUNTER — Encounter: Payer: Self-pay | Admitting: Family Medicine

## 2023-11-17 ENCOUNTER — Ambulatory Visit (INDEPENDENT_AMBULATORY_CARE_PROVIDER_SITE_OTHER): Admitting: Family Medicine

## 2023-11-17 VITALS — BP 118/70 | HR 73 | Temp 97.8°F | Wt 104.0 lb

## 2023-11-17 DIAGNOSIS — G8929 Other chronic pain: Secondary | ICD-10-CM | POA: Diagnosis not present

## 2023-11-17 DIAGNOSIS — M5441 Lumbago with sciatica, right side: Secondary | ICD-10-CM

## 2023-11-17 MED ORDER — HYDROMORPHONE HCL 8 MG PO TABS: 8.0000 mg | ORAL_TABLET | Freq: Three times a day (TID) | ORAL | 0 refills | Status: DC | PRN

## 2023-11-17 MED ORDER — VERAPAMIL HCL ER 120 MG PO TBCR
120.0000 mg | EXTENDED_RELEASE_TABLET | Freq: Every day | ORAL | 3 refills | Status: AC
  Filled 2023-11-17: qty 90, 90d supply, fill #0

## 2023-11-17 NOTE — Progress Notes (Signed)
   Subjective:    Patient ID: [name], female    DOB: 1955/12/08, 68 y.o.   MRN: [id number]  HPI Here for pain management and for advice. We have been treating her chronic arthritis pains with Oxycodone , but over the past few months she has developed severe low back pain that is getting worse. This pain radiates down the right leg. She had an epidural steroid injection to the lumbar pain per Dr. Cesario in August,but this did not help the pain. She has also been seeing Dr. Beuford, and he has proposed doing an L5-S1 discectomy and fusion surgery. [name] is very hesitant to do this for fear that it may make things worse, so she asks our opinion.    Review of Systems  Constitutional: Negative.   Musculoskeletal:  Positive for back pain.       Objective:   Physical Exam Constitutional:      Comments: She is in severe pain. She cannot stand up straight, and she walks bent forward at the waist while leaning to the right side   Neurological:     Mental Status: She is alert.           Assessment & Plan:  Pain management. Indication for chronic opioid: OA and low back pain Medication and dose: Oxycodone  20 mg  # pills per month: 120 Last UDS date: 03-24-23 Opioid Treatment Agreement signed (Y/N): 04-30-17 Opioid Treatment Agreement last reviewed with patient:  11-17-23 NCCSRS reviewed this encounter (include red flags): Yes  She is already taking Celebrex  and Pregabalin , but this is not controlling her pain. We will write for #90 of Hydromorphone  8 mg to take in addition to the Oxycodone  as needed. Also I highly advised her to get a second opinion about the proposed surgery, so we will refer her to [facility] to evaluate as well.  Garnette Olmsted, MD

## 2023-11-18 ENCOUNTER — Other Ambulatory Visit: Payer: Self-pay

## 2023-11-18 ENCOUNTER — Other Ambulatory Visit (HOSPITAL_COMMUNITY): Payer: Self-pay

## 2023-11-23 ENCOUNTER — Other Ambulatory Visit: Payer: Self-pay

## 2023-11-23 ENCOUNTER — Other Ambulatory Visit (HOSPITAL_COMMUNITY): Payer: Self-pay

## 2023-11-24 ENCOUNTER — Encounter: Payer: Self-pay | Admitting: Family Medicine

## 2023-11-24 ENCOUNTER — Other Ambulatory Visit (HOSPITAL_COMMUNITY): Payer: Self-pay

## 2023-11-25 NOTE — Telephone Encounter (Signed)
 I advise her to go with Dr. Malcolm the neurosurgeon

## 2023-11-25 NOTE — Telephone Encounter (Signed)
**Note De-identified  [place] Obfuscation** Please advise 

## 2023-11-30 ENCOUNTER — Other Ambulatory Visit: Payer: Self-pay | Admitting: Neurosurgery

## 2023-12-06 ENCOUNTER — Encounter: Payer: Self-pay | Admitting: Family Medicine

## 2023-12-06 NOTE — Telephone Encounter (Signed)
 She already has a refill available at [facility] for this
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
## 2023-12-07 NOTE — Telephone Encounter (Signed)
 Please call the pharmacist at [facility] about this. It is impossible to transfer a controlled RX like this so it should still be at [facility]

## 2023-12-08 ENCOUNTER — Ambulatory Visit (INDEPENDENT_AMBULATORY_CARE_PROVIDER_SITE_OTHER): Admitting: Family Medicine

## 2023-12-08 ENCOUNTER — Encounter: Payer: Self-pay | Admitting: Family Medicine

## 2023-12-08 VITALS — BP 118/78 | HR 61 | Temp 98.0°F | Wt 109.0 lb

## 2023-12-08 DIAGNOSIS — G8929 Other chronic pain: Secondary | ICD-10-CM

## 2023-12-08 DIAGNOSIS — M5441 Lumbago with sciatica, right side: Secondary | ICD-10-CM

## 2023-12-08 DIAGNOSIS — J40 Bronchitis, not specified as acute or chronic: Secondary | ICD-10-CM

## 2023-12-08 DIAGNOSIS — M150 Primary generalized (osteo)arthritis: Secondary | ICD-10-CM | POA: Diagnosis not present

## 2023-12-08 MED ORDER — OXYCODONE HCL 20 MG PO TABS: 20.0000 mg | ORAL_TABLET | Freq: Four times a day (QID) | ORAL | 0 refills | Status: DC | PRN

## 2023-12-08 MED ORDER — AMOXICILLIN-POT CLAVULANATE 875-125 MG PO TABS: 1.0000 | ORAL_TABLET | Freq: Two times a day (BID) | ORAL | 0 refills | Status: DC

## 2023-12-08 MED ORDER — HYDROMORPHONE HCL 8 MG PO TABS: 8.0000 mg | ORAL_TABLET | Freq: Three times a day (TID) | ORAL | 0 refills | Status: DC | PRN

## 2023-12-08 NOTE — Progress Notes (Signed)
   Subjective:    Patient ID: [name], female    DOB: July 15, 1955, 68 y.o.   MRN: [id number]  HPI Here for pain management and also for one week of sinus congestion, chest congestion, and a dry cough. No fever or SOB. At our last visit we added Hydromorphone  to her Oxycodone , and this combination has been very helpful for her pain. Her lumbar fusion surgery is scheduled for 12-28-23.    Review of Systems  Constitutional: Negative.   HENT:  Positive for congestion and sinus pressure. Negative for ear pain, postnasal drip and sore throat.   Eyes: Negative.   Respiratory:  Positive for cough and chest tightness. Negative for shortness of breath and wheezing.   Cardiovascular: Negative.   Musculoskeletal:  Positive for arthralgias and back pain.       Objective:   Physical Exam Constitutional:      Comments: In pain. Coughing frequently  HENT:     Right Ear: Tympanic membrane, ear canal and external ear normal.     Left Ear: Tympanic membrane, ear canal and external ear normal.     Nose: Nose normal.     Mouth/Throat:     Pharynx: Oropharynx is clear.  Eyes:     Conjunctiva/sclera: Conjunctivae normal.  Pulmonary:     Effort: Pulmonary effort is normal.     Breath sounds: Rhonchi present. No wheezing or rales.  Neurological:     Mental Status: She is alert.           Assessment & Plan:  Pain management. Indication for chronic opioid: OA and low back pain Medication and dose: Oxycodone  20 mg and Hydromorphone  8 mg # pills per month: 120 and 90 Last UDS date: 03-24-23 Opioid Treatment Agreement signed (Y/N): 04-30-17 Opioid Treatment Agreement last reviewed with patient:  12-08-23 [facility] reviewed this encounter (include red flags): Yes We refilled the pain medications. She also has a bronchitis, and we will treat this with 10 days of Augmentin .  Garnette Olmsted, MD

## 2023-12-09 ENCOUNTER — Encounter: Payer: Self-pay | Admitting: Vascular Surgery

## 2023-12-09 ENCOUNTER — Ambulatory Visit: Attending: Vascular Surgery | Admitting: Vascular Surgery

## 2023-12-09 VITALS — BP 154/83 | HR 82 | Temp 98.2°F | Resp 18 | Ht 61.5 in | Wt 109.8 lb

## 2023-12-09 DIAGNOSIS — M5416 Radiculopathy, lumbar region: Secondary | ICD-10-CM | POA: Insufficient documentation

## 2023-12-09 NOTE — Telephone Encounter (Signed)
 Pt had office visit on 12/08/23 with Dr Johnny regarding this message

## 2023-12-14 ENCOUNTER — Other Ambulatory Visit: Payer: Self-pay

## 2023-12-17 NOTE — Progress Notes (Signed)
 Surgical Instructions   Your procedure is scheduled on Tuesday, December 2nd. Report to [facility] Main Entrance A at 5:30 A.M., then check in with the Admitting office. Any questions or running late day of surgery: call [phone]  Questions prior to your surgery date: call [phone], Monday-Friday, 8am-4pm. If you experience any cold or flu symptoms such as cough, fever, chills, shortness of breath, etc. between now and your scheduled surgery, please notify us  at the above number.     Remember:  Do not eat or drink after midnight the night before your surgery. This includes no gum, mints, or hard candy.   Take these medicines the morning of surgery with A SIP OF WATER   pregabalin  (LYRICA )    May take these medicines IF NEEDED: cycloSPORINE  (RESTASIS ) eye drops  HYDROmorphone  (DILAUDID )  Oxycodone   valACYclovir  (VALTREX )    One week prior to surgery, STOP taking any Aspirin  (unless otherwise instructed by your surgeon) Aleve , Naproxen , Ibuprofen , Motrin , Advil , Goody's, BC's, all herbal medications, fish oil, and non-prescription vitamins.                     Do NOT Smoke (Tobacco/Vaping) for 24 hours prior to your procedure.  If you use a CPAP at night, you may bring your mask/headgear for your overnight stay.   You will be asked to remove any contacts, glasses, piercing's, hearing aid's, dentures/partials prior to surgery. Please bring cases for these items if needed.    Patients discharged the day of surgery will not be allowed to drive home, and someone needs to stay with them for 24 hours.  SURGICAL WAITING ROOM VISITATION Patients may have no more than 2 support people in the waiting area - these visitors may rotate.   Pre-op nurse will coordinate an appropriate time for 1 ADULT support person, who may not rotate, to accompany patient in pre-op.  Children under the age of 16 must have an adult with them who is not the patient and must remain in the main waiting  area with an adult.  If the patient needs to stay at the hospital during part of their recovery, the visitor guidelines for inpatient rooms apply.  Please refer to the [facility] website for the visitor guidelines for any additional information.   If you received a COVID test during your pre-op visit  it is requested that you wear a mask when out in public, stay away from anyone that may not be feeling well and notify your surgeon if you develop symptoms. If you have been in contact with anyone that has tested positive in the last 10 days please notify you surgeon.      Pre-operative 4 CHG Bathing Instructions   You can play a key role in reducing the risk of infection after surgery. Your skin needs to be as free of germs as possible. You can reduce the number of germs on your skin by washing with CHG (chlorhexidine  gluconate) soap before surgery. CHG is an antiseptic soap that kills germs and continues to kill germs even after washing.   DO NOT use if you have an allergy to chlorhexidine /CHG or antibacterial soaps. If your skin becomes reddened or irritated, stop using the CHG and notify one of our RNs at [phone].   Please shower with the CHG soap starting 4 days before surgery using the following schedule:     Please keep in mind the following:  DO NOT shave, including legs and underarms, starting the day of  your first shower.   You may shave your face at any point before/day of surgery.  Place clean sheets on your bed the day you start using CHG soap. Use a clean washcloth (not used since being washed) for each shower. DO NOT sleep with pets once you start using the CHG.   CHG Shower Instructions:  Wash your face and private area with normal soap. If you choose to wash your hair, wash first with your normal shampoo.  After you use shampoo/soap, rinse your hair and body thoroughly to remove shampoo/soap residue.  Turn the water  OFF and apply  bottle of CHG soap to a CLEAN  washcloth.  Apply CHG soap ONLY FROM YOUR NECK DOWN TO YOUR TOES (washing for 3-5 minutes)  DO NOT use CHG soap on face, private areas, open wounds, or sores.  Pay special attention to the area where your surgery is being performed.  If you are having back surgery, having someone wash your back for you may be helpful. Wait 2 minutes after CHG soap is applied, then you may rinse off the CHG soap.  Pat dry with a clean towel  Put on clean clothes/pajamas   If you choose to wear lotion, please use ONLY the CHG-compatible lotions that are listed below.  Additional instructions for the day of surgery:  If you choose, you may shower the morning of surgery with an antibacterial soap.  DO NOT APPLY any lotions, deodorants, cologne, or perfumes.   Do not bring valuables to the hospital. [facility] is not responsible for any belongings/valuables. Do not wear nail polish, gel polish, artificial nails, or any other type of covering on natural nails (fingers and toes) Do not wear jewelry or makeup Put on clean/comfortable clothes.  Please brush your teeth.  Ask your nurse before applying any prescription medications to the skin.     CHG Compatible Lotions   Aveeno Moisturizing lotion  Cetaphil Moisturizing Cream  Cetaphil Moisturizing Lotion  Clairol Herbal Essence Moisturizing Lotion, Dry Skin  Clairol Herbal Essence Moisturizing Lotion, Extra Dry Skin  Clairol Herbal Essence Moisturizing Lotion, Normal Skin  Curel Age Defying Therapeutic Moisturizing Lotion with Alpha Hydroxy  Curel Extreme Care Body Lotion  Curel Soothing Hands Moisturizing Hand Lotion  Curel Therapeutic Moisturizing Cream, Fragrance-Free  Curel Therapeutic Moisturizing Lotion, Fragrance-Free  Curel Therapeutic Moisturizing Lotion, Original Formula  Eucerin Daily Replenishing Lotion  Eucerin Dry Skin Therapy Plus Alpha Hydroxy Crme  Eucerin Dry Skin Therapy Plus Alpha Hydroxy Lotion  Eucerin Original Crme  Eucerin  Original Lotion  Eucerin Plus Crme Eucerin Plus Lotion  Eucerin TriLipid Replenishing Lotion  Keri Anti-Bacterial Hand Lotion  Keri Deep Conditioning Original Lotion Dry Skin Formula Softly Scented  Keri Deep Conditioning Original Lotion, Fragrance Free Sensitive Skin Formula  Keri Lotion Fast Absorbing Fragrance Free Sensitive Skin Formula  Keri Lotion Fast Absorbing Softly Scented Dry Skin Formula  Keri Original Lotion  Keri Skin Renewal Lotion Keri Silky Smooth Lotion  Keri Silky Smooth Sensitive Skin Lotion  Nivea Body Creamy Conditioning Oil  Nivea Body Extra Enriched Lotion  Nivea Body Original Lotion  Nivea Body Sheer Moisturizing Lotion Nivea Crme  Nivea Skin Firming Lotion  NutraDerm 30 Skin Lotion  NutraDerm Skin Lotion  NutraDerm Therapeutic Skin Cream  NutraDerm Therapeutic Skin Lotion  ProShield Protective Hand Cream  Provon moisturizing lotion  Please read over the following fact sheets that you were given.

## 2023-12-20 ENCOUNTER — Other Ambulatory Visit: Payer: Self-pay

## 2023-12-20 ENCOUNTER — Encounter (HOSPITAL_COMMUNITY): Payer: Self-pay

## 2023-12-20 ENCOUNTER — Encounter (HOSPITAL_COMMUNITY)
Admission: RE | Admit: 2023-12-20 | Discharge: 2023-12-20 | Disposition: A | Discharge status: Home / Self Care | Source: Ambulatory Visit | Attending: Neurosurgery | Admitting: Neurosurgery

## 2023-12-20 VITALS — BP 150/86 | Resp 16 | Ht 61.5 in | Wt 106.0 lb

## 2023-12-20 DIAGNOSIS — Z01818 Encounter for other preprocedural examination: Secondary | ICD-10-CM | POA: Insufficient documentation

## 2023-12-20 DIAGNOSIS — I10 Essential (primary) hypertension: Secondary | ICD-10-CM | POA: Insufficient documentation

## 2023-12-20 DIAGNOSIS — Z01810 Encounter for preprocedural cardiovascular examination: Secondary | ICD-10-CM | POA: Diagnosis present

## 2023-12-20 DIAGNOSIS — K759 Inflammatory liver disease, unspecified: Secondary | ICD-10-CM | POA: Insufficient documentation

## 2023-12-20 DIAGNOSIS — Z01812 Encounter for preprocedural laboratory examination: Secondary | ICD-10-CM | POA: Diagnosis present

## 2023-12-20 LAB — CBC
HCT: 42 % (ref 36.0–46.0)
Hemoglobin: 13.7 g/dL (ref 12.0–15.0)
MCH: 30.7 pg (ref 26.0–34.0)
MCHC: 32.6 g/dL (ref 30.0–36.0)
MCV: 94.2 fL (ref 80.0–100.0)
Platelets: 156 K/uL (ref 150–400)
RBC: 4.46 MIL/uL (ref 3.87–5.11)
RDW: 13.5 % (ref 11.5–15.5)
WBC: 6.1 K/uL (ref 4.0–10.5)
nRBC: 0 % (ref 0.0–0.2)

## 2023-12-20 LAB — COMPREHENSIVE METABOLIC PANEL WITH GFR
ALT: 29 U/L (ref 0–44)
AST: 35 U/L (ref 15–41)
Albumin: 4.2 g/dL (ref 3.5–5.0)
Alkaline Phosphatase: 79 U/L (ref 38–126)
Anion gap: 12 (ref 5–15)
BUN: 19 mg/dL (ref 8–23)
CO2: 27 mmol/L (ref 22–32)
Calcium: 9.2 mg/dL (ref 8.9–10.3)
Chloride: 96 mmol/L — ABNORMAL LOW (ref 98–111)
Creatinine, Ser: 0.71 mg/dL (ref 0.44–1.00)
GFR, Estimated: >60 mL/min (ref >60)
Glucose, Bld: 92 mg/dL (ref 70–99)
Potassium: 4 mmol/L (ref 3.5–5.1)
Sodium: 135 mmol/L (ref 135–145)
Total Bilirubin: 1.4 mg/dL — ABNORMAL HIGH (ref 0.0–1.2)
Total Protein: 8 g/dL (ref 6.5–8.1)

## 2023-12-20 LAB — TYPE AND SCREEN
ABO/RH(D): A NEG
Antibody Screen: NEGATIVE

## 2023-12-20 LAB — SURGICAL PCR SCREEN
MRSA, PCR: NEGATIVE
Staphylococcus aureus: NEGATIVE

## 2023-12-20 NOTE — Progress Notes (Addendum)
 PCP - Johnny Senior, MD Cardiologist - denies  PPM/ICD - denies Device Orders - n/a Rep Notified - n/a  Chest x-ray - n/a EKG - 12/20/2023 Stress Test - denies ECHO - 06/26/2008 Cardiac Cath - denies  Sleep Study - denies CPAP - n/a  Fasting Blood Sugar - no DM Checks Blood Sugar _____ times a day  Last dose of GLP1 agonist-  n/a GLP1 instructions: n/a  Blood Thinner Instructions: n/a Aspirin  Instructions: n/a  ERAS Protcol - no, NPO PRE-SURGERY Ensure or G2- n/a  COVID TEST- n/a   Anesthesia review: yes, recent hx of bronchitis (12/08/2023); HTN, Raynaud's disease. BP- 177/89; recheck -150/86. [place], [place] is aware.   Patient denies shortness of breath, fever, cough and chest pain at PAT appointment   All instructions explained to the patient, with a verbal understanding of the material. Patient agrees to go over the instructions while at home for a better understanding. Patient also instructed to self quarantine after being tested for COVID-19. The opportunity to ask questions was provided.

## 2023-12-27 NOTE — Anesthesia Preprocedure Evaluation (Signed)
 Anesthesia Evaluation  Patient identified by MRN, date of birth, ID band Patient awake    Reviewed: Allergy & Precautions, NPO status , Patient's Chart, lab work & pertinent test results  Airway Mallampati: II  TM Distance: >3 FB Neck ROM: Full    Dental  (+) Teeth Intact, Dental Advisory Given, Loose,    Pulmonary neg pulmonary ROS   Pulmonary exam normal breath sounds clear to auscultation       Cardiovascular hypertension, Pt. on medications + Peripheral Vascular Disease  Normal cardiovascular exam Rhythm:Regular Rate:Normal     Neuro/Psych  PSYCHIATRIC DISORDERS Anxiety Depression    Spondylolisthesis lumbar region  Neuromuscular disease    GI/Hepatic negative GI ROS, Neg liver ROS,,,  Endo/Other  negative endocrine ROS    Renal/GU negative Renal ROS     Musculoskeletal  (+) Arthritis ,    Abdominal   Peds  (+) ADHD Hematology negative hematology ROS (+)   Anesthesia Other Findings   Reproductive/Obstetrics                              Anesthesia Physical Anesthesia Plan  ASA: 3  Anesthesia Plan: General   Post-op Pain Management: Tylenol  PO (pre-op)*   Induction: Intravenous  PONV Risk Score and Plan: 3 and Midazolam , Dexamethasone  and Ondansetron   Airway Management Planned: Oral ETT  Additional Equipment:   Intra-op Plan:   Post-operative Plan: Extubation in OR  Informed Consent: I have reviewed the patients History and Physical, chart, labs and discussed the procedure including the risks, benefits and alternatives for the proposed anesthesia with the patient or authorized representative who has indicated his/her understanding and acceptance.     Dental advisory given  Plan Discussed with: CRNA  Anesthesia Plan Comments:          Anesthesia Quick Evaluation

## 2023-12-28 ENCOUNTER — Ambulatory Visit (HOSPITAL_COMMUNITY): Payer: Self-pay | Admitting: Physician Assistant

## 2023-12-28 ENCOUNTER — Other Ambulatory Visit: Payer: Self-pay

## 2023-12-28 ENCOUNTER — Encounter (HOSPITAL_COMMUNITY): Admission: RE | Disposition: A | Payer: Self-pay | Source: Home / Self Care | Attending: Neurological Surgery

## 2023-12-28 ENCOUNTER — Encounter (HOSPITAL_COMMUNITY): Payer: Self-pay | Admitting: Neurological Surgery

## 2023-12-28 ENCOUNTER — Ambulatory Visit (HOSPITAL_COMMUNITY): Payer: Self-pay | Admitting: Anesthesiology

## 2023-12-28 ENCOUNTER — Observation Stay (HOSPITAL_COMMUNITY)
Admission: RE | Admit: 2023-12-28 | Discharge: 2023-12-30 | Disposition: A | Attending: Neurological Surgery | Admitting: Neurological Surgery

## 2023-12-28 ENCOUNTER — Ambulatory Visit (HOSPITAL_COMMUNITY)

## 2023-12-28 DIAGNOSIS — M4316 Spondylolisthesis, lumbar region: Secondary | ICD-10-CM | POA: Diagnosis not present

## 2023-12-28 DIAGNOSIS — Z79899 Other long term (current) drug therapy: Secondary | ICD-10-CM | POA: Insufficient documentation

## 2023-12-28 DIAGNOSIS — M5416 Radiculopathy, lumbar region: Principal | ICD-10-CM | POA: Insufficient documentation

## 2023-12-28 DIAGNOSIS — I10 Essential (primary) hypertension: Secondary | ICD-10-CM | POA: Diagnosis not present

## 2023-12-28 DIAGNOSIS — Z96612 Presence of left artificial shoulder joint: Secondary | ICD-10-CM | POA: Insufficient documentation

## 2023-12-28 DIAGNOSIS — Z981 Arthrodesis status: Principal | ICD-10-CM

## 2023-12-28 DIAGNOSIS — M810 Age-related osteoporosis without current pathological fracture: Secondary | ICD-10-CM | POA: Insufficient documentation

## 2023-12-28 DIAGNOSIS — M48062 Spinal stenosis, lumbar region with neurogenic claudication: Secondary | ICD-10-CM | POA: Insufficient documentation

## 2023-12-28 SURGERY — POSTERIOR LUMBAR FUSION 1 LEVEL
Anesthesia: General | Site: Back

## 2023-12-28 MED ORDER — SENNA 8.6 MG PO TABS
1.0000 | ORAL_TABLET | Freq: Two times a day (BID) | ORAL | Status: DC
  Administered 2023-12-28 – 2023-12-30 (×5): 8.6 mg via ORAL
  Filled 2023-12-28 (×5): qty 1

## 2023-12-28 MED ORDER — CELECOXIB 200 MG PO CAPS
200.0000 mg | ORAL_CAPSULE | Freq: Two times a day (BID) | ORAL | Status: DC
  Administered 2023-12-28 – 2023-12-30 (×5): 200 mg via ORAL
  Filled 2023-12-28 (×5): qty 1

## 2023-12-28 MED ORDER — CHLORHEXIDINE GLUCONATE CLOTH 2 % EX PADS: 6.0000 | MEDICATED_PAD | Freq: Once | CUTANEOUS | Status: AC

## 2023-12-28 MED ORDER — CHLORHEXIDINE GLUCONATE CLOTH 2 % EX PADS: 6.0000 | MEDICATED_PAD | Freq: Once | CUTANEOUS | Status: DC

## 2023-12-28 MED ORDER — POTASSIUM CHLORIDE IN NACL 20-0.9 MEQ/L-% IV SOLN
INTRAVENOUS | Status: DC
  Filled 2023-12-28: qty 1000

## 2023-12-28 MED ORDER — LIDOCAINE 2% (20 MG/ML) 5 ML SYRINGE
INTRAMUSCULAR | Status: AC
  Filled 2023-12-28: qty 5

## 2023-12-28 MED ORDER — ORAL CARE MOUTH RINSE: 15.0000 mL | Freq: Once | OROMUCOSAL | Status: AC

## 2023-12-28 MED ORDER — FENTANYL CITRATE (PF) 100 MCG/2ML IJ SOLN
INTRAMUSCULAR | Status: AC
  Filled 2023-12-28: qty 2

## 2023-12-28 MED ORDER — SODIUM CHLORIDE 0.9% FLUSH
3.0000 mL | Freq: Two times a day (BID) | INTRAVENOUS | Status: DC
  Administered 2023-12-28 – 2023-12-29 (×4): 3 mL via INTRAVENOUS

## 2023-12-28 MED ORDER — PHENYLEPHRINE 80 MCG/ML (10ML) SYRINGE FOR IV PUSH (FOR BLOOD PRESSURE SUPPORT)
PREFILLED_SYRINGE | INTRAVENOUS | Status: AC
  Filled 2023-12-28: qty 10

## 2023-12-28 MED ORDER — METHOCARBAMOL 500 MG PO TABS
500.0000 mg | ORAL_TABLET | Freq: Four times a day (QID) | ORAL | Status: DC | PRN
  Administered 2023-12-28 – 2023-12-29 (×3): 500 mg via ORAL
  Filled 2023-12-28 (×3): qty 1

## 2023-12-28 MED ORDER — FENTANYL CITRATE (PF) 250 MCG/5ML IJ SOLN
INTRAMUSCULAR | Status: DC | PRN
  Administered 2023-12-28 (×3): 50 ug via INTRAVENOUS

## 2023-12-28 MED ORDER — ADHERUS DURAL SEALANT
PACK | TOPICAL | Status: DC | PRN
  Administered 2023-12-28: 1 via TOPICAL

## 2023-12-28 MED ORDER — THROMBIN 5000 UNITS EX KIT
PACK | CUTANEOUS | Status: AC
  Filled 2023-12-28: qty 1

## 2023-12-28 MED ORDER — PHENYLEPHRINE 80 MCG/ML (10ML) SYRINGE FOR IV PUSH (FOR BLOOD PRESSURE SUPPORT)
PREFILLED_SYRINGE | INTRAVENOUS | Status: DC | PRN
  Administered 2023-12-28: 80 ug via INTRAVENOUS
  Administered 2023-12-28: 160 ug via INTRAVENOUS

## 2023-12-28 MED ORDER — PROPOFOL 10 MG/ML IV BOLUS
INTRAVENOUS | Status: AC
  Filled 2023-12-28: qty 20

## 2023-12-28 MED ORDER — ONDANSETRON HCL 4 MG/2ML IJ SOLN: 4.0000 mg | Freq: Once | INTRAMUSCULAR | Status: DC | PRN

## 2023-12-28 MED ORDER — CEFAZOLIN SODIUM-DEXTROSE 2-4 GM/100ML-% IV SOLN
2.0000 g | Freq: Three times a day (TID) | INTRAVENOUS | Status: AC
  Administered 2023-12-28 (×2): 2 g via INTRAVENOUS
  Filled 2023-12-28 (×2): qty 100

## 2023-12-28 MED ORDER — DEXAMETHASONE SOD PHOSPHATE PF 10 MG/ML IJ SOLN
INTRAMUSCULAR | Status: DC | PRN
  Administered 2023-12-28: 5 mg via INTRAVENOUS

## 2023-12-28 MED ORDER — ONDANSETRON HCL 4 MG/2ML IJ SOLN
INTRAMUSCULAR | Status: AC
  Filled 2023-12-28: qty 2

## 2023-12-28 MED ORDER — MIDAZOLAM HCL (PF) 2 MG/2ML IJ SOLN
INTRAMUSCULAR | Status: DC | PRN
  Administered 2023-12-28: 2 mg via INTRAVENOUS

## 2023-12-28 MED ORDER — FENTANYL CITRATE (PF) 250 MCG/5ML IJ SOLN
INTRAMUSCULAR | Status: AC
  Filled 2023-12-28: qty 5

## 2023-12-28 MED ORDER — PROPOFOL 10 MG/ML IV BOLUS
INTRAVENOUS | Status: DC | PRN
  Administered 2023-12-28: 50 ug/kg/min via INTRAVENOUS
  Administered 2023-12-28: 100 mg via INTRAVENOUS
  Administered 2023-12-28: 50 mg via INTRAVENOUS

## 2023-12-28 MED ORDER — FENTANYL CITRATE (PF) 100 MCG/2ML IJ SOLN
25.0000 ug | INTRAMUSCULAR | Status: DC | PRN
  Administered 2023-12-28 (×3): 50 ug via INTRAVENOUS

## 2023-12-28 MED ORDER — THROMBIN 5000 UNITS EX SOLR: OROMUCOSAL | Status: DC | PRN

## 2023-12-28 MED ORDER — THROMBIN 20000 UNITS EX SOLR: CUTANEOUS | Status: DC | PRN

## 2023-12-28 MED ORDER — SURGIPHOR WOUND IRRIGATION SYSTEM - OPTIME: TOPICAL | Status: DC | PRN

## 2023-12-28 MED ORDER — DOXYCYCLINE HYCLATE 100 MG PO TABS
50.0000 mg | ORAL_TABLET | Freq: Every day | ORAL | Status: DC
  Administered 2023-12-28 – 2023-12-30 (×3): 50 mg via ORAL
  Filled 2023-12-28 (×3): qty 1

## 2023-12-28 MED ORDER — OXYCODONE HCL 5 MG PO TABS
20.0000 mg | ORAL_TABLET | Freq: Four times a day (QID) | ORAL | Status: DC | PRN
  Administered 2023-12-28 – 2023-12-30 (×8): 20 mg via ORAL
  Filled 2023-12-28 (×8): qty 4

## 2023-12-28 MED ORDER — CHLORHEXIDINE GLUCONATE CLOTH 2 % EX PADS
6.0000 | MEDICATED_PAD | Freq: Once | CUTANEOUS | Status: AC
  Administered 2023-12-28: 6 via TOPICAL

## 2023-12-28 MED ORDER — LACTATED RINGERS IV SOLN: INTRAVENOUS | Status: DC | PRN

## 2023-12-28 MED ORDER — PHENYLEPHRINE HCL-NACL 20-0.9 MG/250ML-% IV SOLN
INTRAVENOUS | Status: DC | PRN
  Administered 2023-12-28: 20 ug/min via INTRAVENOUS

## 2023-12-28 MED ORDER — KETAMINE HCL 10 MG/ML IJ SOLN
INTRAMUSCULAR | Status: DC | PRN
  Administered 2023-12-28: 20 mg via INTRAVENOUS

## 2023-12-28 MED ORDER — PHENOL 1.4 % MT LIQD: 1.0000 | OROMUCOSAL | Status: DC | PRN

## 2023-12-28 MED ORDER — ONDANSETRON HCL 4 MG/2ML IJ SOLN: 4.0000 mg | Freq: Four times a day (QID) | INTRAMUSCULAR | Status: DC | PRN

## 2023-12-28 MED ORDER — CHLORHEXIDINE GLUCONATE 0.12 % MT SOLN
15.0000 mL | Freq: Once | OROMUCOSAL | Status: AC
  Administered 2023-12-28: 15 mL via OROMUCOSAL
  Filled 2023-12-28: qty 15

## 2023-12-28 MED ORDER — ACETAMINOPHEN 500 MG PO TABS
1000.0000 mg | ORAL_TABLET | Freq: Four times a day (QID) | ORAL | Status: AC
  Administered 2023-12-28 – 2023-12-29 (×4): 1000 mg via ORAL
  Filled 2023-12-28 (×4): qty 2

## 2023-12-28 MED ORDER — BUPIVACAINE HCL (PF) 0.25 % IJ SOLN
INTRAMUSCULAR | Status: AC
  Filled 2023-12-28: qty 30

## 2023-12-28 MED ORDER — CEFAZOLIN SODIUM-DEXTROSE 2-4 GM/100ML-% IV SOLN
2.0000 g | INTRAVENOUS | Status: AC
  Administered 2023-12-28: 2 g via INTRAVENOUS
  Filled 2023-12-28: qty 100

## 2023-12-28 MED ORDER — ONDANSETRON HCL 4 MG PO TABS: 4.0000 mg | ORAL_TABLET | Freq: Four times a day (QID) | ORAL | Status: DC | PRN

## 2023-12-28 MED ORDER — METHOCARBAMOL 1000 MG/10ML IJ SOLN: 500.0000 mg | Freq: Four times a day (QID) | INTRAMUSCULAR | Status: DC | PRN

## 2023-12-28 MED ORDER — SODIUM CHLORIDE 0.9 % IV SOLN
0.1500 ug/kg/min | INTRAVENOUS | Status: AC
  Administered 2023-12-28: .05 ug/kg/min via INTRAVENOUS
  Administered 2023-12-28: .1 ug/kg/min via INTRAVENOUS
  Filled 2023-12-28: qty 2000

## 2023-12-28 MED ORDER — LACTATED RINGERS IV SOLN: INTRAVENOUS | Status: DC

## 2023-12-28 MED ORDER — PREGABALIN 100 MG PO CAPS
300.0000 mg | ORAL_CAPSULE | Freq: Two times a day (BID) | ORAL | Status: DC
  Administered 2023-12-28 – 2023-12-30 (×4): 300 mg via ORAL
  Filled 2023-12-28 (×4): qty 3

## 2023-12-28 MED ORDER — 0.9 % SODIUM CHLORIDE (POUR BTL) OPTIME
TOPICAL | Status: DC | PRN
  Administered 2023-12-28: 1000 mL

## 2023-12-28 MED ORDER — ACETAMINOPHEN 500 MG PO TABS
1000.0000 mg | ORAL_TABLET | Freq: Once | ORAL | Status: AC
  Administered 2023-12-28: 1000 mg via ORAL
  Filled 2023-12-28: qty 2

## 2023-12-28 MED ORDER — BUPIVACAINE HCL (PF) 0.25 % IJ SOLN
INTRAMUSCULAR | Status: DC | PRN
  Administered 2023-12-28: 6 mL

## 2023-12-28 MED ORDER — HYDROMORPHONE HCL 1 MG/ML IJ SOLN
0.5000 mg | INTRAMUSCULAR | Status: DC | PRN
  Administered 2023-12-28 – 2023-12-30 (×6): 0.5 mg via INTRAVENOUS
  Filled 2023-12-28 (×6): qty 0.5

## 2023-12-28 MED ORDER — ONDANSETRON HCL 4 MG/2ML IJ SOLN
INTRAMUSCULAR | Status: DC | PRN
  Administered 2023-12-28: 4 mg via INTRAVENOUS

## 2023-12-28 MED ORDER — VERAPAMIL HCL ER 120 MG PO TBCR
120.0000 mg | EXTENDED_RELEASE_TABLET | Freq: Every day | ORAL | Status: DC
  Administered 2023-12-28 – 2023-12-29 (×2): 120 mg via ORAL
  Filled 2023-12-28 (×2): qty 1

## 2023-12-28 MED ORDER — SODIUM CHLORIDE 0.9 % IV SOLN
250.0000 mL | INTRAVENOUS | Status: AC
  Administered 2023-12-28: 250 mL via INTRAVENOUS

## 2023-12-28 MED ORDER — MENTHOL 3 MG MT LOZG: 1.0000 | LOZENGE | OROMUCOSAL | Status: DC | PRN

## 2023-12-28 MED ORDER — AMPHETAMINE-DEXTROAMPHET ER 20 MG PO CP24
20.0000 mg | ORAL_CAPSULE | ORAL | Status: DC
  Filled 2023-12-28: qty 1

## 2023-12-28 MED ORDER — MIDAZOLAM HCL 2 MG/2ML IJ SOLN
INTRAMUSCULAR | Status: AC
  Filled 2023-12-28: qty 2

## 2023-12-28 MED ORDER — THROMBIN 20000 UNITS EX SOLR
CUTANEOUS | Status: AC
  Filled 2023-12-28: qty 20000

## 2023-12-28 MED ORDER — CYCLOSPORINE 0.05 % OP EMUL
1.0000 [drp] | Freq: Two times a day (BID) | OPHTHALMIC | Status: DC
  Administered 2023-12-28 – 2023-12-30 (×5): 1 [drp] via OPHTHALMIC
  Filled 2023-12-28 (×4): qty 1

## 2023-12-28 MED ORDER — KETAMINE HCL 50 MG/5ML IJ SOSY
PREFILLED_SYRINGE | INTRAMUSCULAR | Status: AC
  Filled 2023-12-28: qty 5

## 2023-12-28 MED ORDER — SUCCINYLCHOLINE CHLORIDE 200 MG/10ML IV SOSY
PREFILLED_SYRINGE | INTRAVENOUS | Status: DC | PRN
  Administered 2023-12-28: 100 mg via INTRAVENOUS

## 2023-12-28 MED ORDER — LIDOCAINE 2% (20 MG/ML) 5 ML SYRINGE
INTRAMUSCULAR | Status: DC | PRN
  Administered 2023-12-28: 60 mg via INTRAVENOUS

## 2023-12-28 MED ORDER — SODIUM CHLORIDE 0.9% FLUSH: 3.0000 mL | INTRAVENOUS | Status: DC | PRN

## 2023-12-28 SURGICAL SUPPLY — 56 items
ALLOGRAFT BONE FIBER KORE 2.5 (Bone Implant) IMPLANT
BAG COUNTER SPONGE SURGICOUNT (BAG) ×1 IMPLANT
BASKET BONE COLLECTION (BASKET) ×1 IMPLANT
BENZOIN TINCTURE PRP APPL 2/3 (GAUZE/BANDAGES/DRESSINGS) ×1 IMPLANT
BLADE BONE MILL MEDIUM (MISCELLANEOUS) ×1 IMPLANT
BLADE CLIPPER SURG (BLADE) IMPLANT
BUR CARBIDE MATCH 3.0 (BURR) ×1 IMPLANT
CANISTER SUCTION 3000ML PPV (SUCTIONS) ×1 IMPLANT
CLIP SPRING STIM LLIF SAFEOP (CLIP) IMPLANT
CNTNR URN SCR LID CUP LEK RST (MISCELLANEOUS) ×1 IMPLANT
COVER BACK TABLE 60X90IN (DRAPES) ×1 IMPLANT
DRAPE C-ARM 42X72 X-RAY (DRAPES) ×2 IMPLANT
DRAPE C-ARMOR (DRAPES) ×1 IMPLANT
DRAPE LAPAROTOMY 100X72X124 (DRAPES) ×1 IMPLANT
DRAPE SURG 17X23 STRL (DRAPES) ×1 IMPLANT
DRESSING AQUACEL AG SP 3.5X4 (GAUZE/BANDAGES/DRESSINGS) IMPLANT
DRSG AQUACEL AG ADV 3.5X 6 (GAUZE/BANDAGES/DRESSINGS) ×2 IMPLANT
DURAPREP 26ML APPLICATOR (WOUND CARE) ×1 IMPLANT
ELECTRODE KT SAFEOP SSEP/SURF (KITS) IMPLANT
ELECTRODE REM PT RTRN 9FT ADLT (ELECTROSURGICAL) ×1 IMPLANT
EVACUATOR 1/8 PVC DRAIN (DRAIN) ×1 IMPLANT
GAUZE 4X4 16PLY ~~LOC~~+RFID DBL (SPONGE) IMPLANT
GLOVE BIO SURGEON STRL SZ7 (GLOVE) IMPLANT
GLOVE BIO SURGEON STRL SZ8 (GLOVE) ×2 IMPLANT
GLOVE BIOGEL PI IND STRL 7.0 (GLOVE) IMPLANT
GOWN STRL REUS W/ TWL LRG LVL3 (GOWN DISPOSABLE) IMPLANT
GOWN STRL REUS W/ TWL XL LVL3 (GOWN DISPOSABLE) ×2 IMPLANT
GOWN STRL REUS W/TWL 2XL LVL3 (GOWN DISPOSABLE) IMPLANT
HEMOSTAT POWDER KIT SURGIFOAM (HEMOSTASIS) ×1 IMPLANT
KIT BASIN OR (CUSTOM PROCEDURE TRAY) ×1 IMPLANT
KIT INFUSE X SMALL 1.4CC (Orthopedic Implant) IMPLANT
KIT TURNOVER KIT B (KITS) ×1 IMPLANT
NDL HYPO 25X1 1.5 SAFETY (NEEDLE) ×1 IMPLANT
PACK LAMINECTOMY NEURO (CUSTOM PROCEDURE TRAY) ×1 IMPLANT
PAD ARMBOARD POSITIONER FOAM (MISCELLANEOUS) ×3 IMPLANT
PROBE BALL TIP LLIF SAFEOP (NEUROSURGERY SUPPLIES) IMPLANT
ROD LORD LIPPED TI 5.5X35 (Rod) IMPLANT
ROD LORD LIPPED TI 5.5X40 (Rod) IMPLANT
SCREW CANC PA 6.5X40 (Screw) IMPLANT
SCREW CANC SHANK MOD 5.5X45 (Screw) IMPLANT
SCREW POLYAXIAL TULIP (Screw) IMPLANT
SEALANT ADHERUS EXTEND TIP (MISCELLANEOUS) IMPLANT
SET SCREW SPNE (Screw) IMPLANT
SOLN 0.9% NACL POUR BTL 1000ML (IV SOLUTION) ×1 IMPLANT
SOLN STERILE WATER BTL 1000 ML (IV SOLUTION) ×1 IMPLANT
SOLUTION IRRIG SURGIPHOR (IV SOLUTION) ×1 IMPLANT
SPACER IDENTITI PS 8X9X25 15D (Spacer) IMPLANT
SPONGE SURGIFOAM ABS GEL 100 (HEMOSTASIS) ×1 IMPLANT
SPONGE T-LAP 4X18 ~~LOC~~+RFID (SPONGE) IMPLANT
STRIP CLOSURE SKIN 1/2X4 (GAUZE/BANDAGES/DRESSINGS) ×1 IMPLANT
SUT PROLENE 6 0 BV (SUTURE) IMPLANT
SUT VIC AB 0 CT1 18XCR BRD8 (SUTURE) ×1 IMPLANT
SUT VIC AB 2-0 CP2 18 (SUTURE) ×1 IMPLANT
SUT VIC AB 3-0 SH 8-18 (SUTURE) ×2 IMPLANT
TOWEL GREEN STERILE (TOWEL DISPOSABLE) ×1 IMPLANT
TOWEL GREEN STERILE FF (TOWEL DISPOSABLE) ×1 IMPLANT

## 2023-12-28 NOTE — Anesthesia Procedure Notes (Addendum)
 Procedure Name: Intubation Date/Time: 12/28/2023 7:57 AM  Performed by: Hedy Jarred, CRNAPre-anesthesia Checklist: Patient identified, Emergency Drugs available, Suction available and Patient being monitored Patient Re-evaluated:Patient Re-evaluated prior to induction Oxygen Delivery Method: Circle System Utilized Preoxygenation: Pre-oxygenation with 100% oxygen Induction Type: IV induction Ventilation: Mask ventilation without difficulty Laryngoscope Size: Miller and 2 Grade View: Grade II Tube type: Oral Tube size: 7.0 mm Number of attempts: 1 Airway Equipment and Method: Stylet and Oral airway Placement Confirmation: ETT inserted through vocal cords under direct vision, positive ETCO2 and breath sounds checked- equal and bilateral Tube secured with: Tape Dental Injury: Teeth and Oropharynx as per pre-operative assessment  Comments: Atraumatic intubation using Mil 2 blade; Grade 2b Cormack-Lehane view noted upon DVL; Anterior airway;  BURP. Dentition intact per baseline (preexisting left upper P1 loose from root but remains attached).

## 2023-12-28 NOTE — Transfer of Care (Signed)
 Immediate Anesthesia Transfer of Care Note  Patient: [name]  Procedure(s) Performed: LUMBAR FIVE-SACRAL ONE POSTERIOR LUMBAR INTERBODY FUSION (Back)  Patient Location: PACU  Anesthesia Type:General  Level of Consciousness: drowsy  Airway & Oxygen Therapy: Patient Spontanous Breathing and Patient connected to face mask oxygen  Post-op Assessment: Report given to RN and Post -op Vital signs reviewed and stable  Post vital signs: Reviewed and stable  Last Vitals:  Vitals Value Taken Time  BP 148/94 12/28/23 11:15  Temp 36.6 C 12/28/23 11:09  Pulse 84 12/28/23 11:19  Resp 12 12/28/23 11:19  SpO2 98 % 12/28/23 11:19  Vitals shown include unfiled device data.  Last Pain:  Vitals:   12/28/23 1115  TempSrc:   PainSc: 0-No pain         Complications: No notable events documented.

## 2023-12-28 NOTE — Evaluation (Signed)
 Physical Therapy Evaluation Patient Details Name: Bryce Kimble MRN: [id number] DOB: 10/07/1955 Today's Date: 12/28/2023  History of Present Illness  Shavana Calder  68 y.o. female s/p L5-S1 posterior interbody fusion. on 12/2. PMH significant for ORIF R fibula, L total shoulder arthroplasty, ACDF, ORIF L clavicle, ADHD, ARDS, osteoporosis, R prosthetic eye, HTN, ADHD, MRSA, chronic narcotic dependence.  Clinical Impression  Pt presents with admitting diagnosis above. Pt today was able to ambulate in hallway with RW supervision/CGA. Pt with good adherence to spinal precautions. PTA pt reports that she was ambulating hunched over with a RW. Pt states that she plans to discharge to her camper at [facility] with her son this friday or saturday. If she discharges before then pt states that she has friends who with stay with her at her current home until her son can pick her up. Follow physician recommends for DC plan. PT will continue to follow. Patient needs to practice stairs next session.             If plan is discharge home, recommend the following: A little help with walking and/or transfers;A little help with bathing/dressing/bathroom;Help with stairs or ramp for entrance;Assist for transportation;Assistance with cooking/housework   Can travel by private vehicle        Equipment Recommendations None recommended by PT  Recommendations for Other Services       Functional Status Assessment Patient has had a recent decline in their functional status and demonstrates the ability to make significant improvements in function in a reasonable and predictable amount of time.     Precautions / Restrictions Precautions Precautions: Fall;Back Precaution Booklet Issued: Yes (comment) Recall of Precautions/Restrictions: Intact Required Braces or Orthoses: Spinal Brace Spinal Brace: Lumbar corset;Applied in sitting position Restrictions Weight Bearing Restrictions Per  Provider Order: No      Mobility  Bed Mobility Overal bed mobility: Needs Assistance Bed Mobility: Rolling, Sidelying to Sit, Sit to Sidelying Rolling: Supervision Sidelying to sit: Supervision     Sit to sidelying: Supervision General bed mobility comments: Good logroll.    Transfers Overall transfer level: Needs assistance Equipment used: Rolling walker (2 wheels) Transfers: Sit to/from Stand Sit to Stand: Supervision           General transfer comment: Cues for hand placement. Able to stand from bed and toilet.    Ambulation/Gait Ambulation/Gait assistance: Contact guard assist, Supervision Gait Distance (Feet): 120 Feet Assistive device: Rolling walker (2 wheels) Gait Pattern/deviations: Shuffle, Narrow base of support, Trunk flexed, Decreased stride length, Step-through pattern Gait velocity: decreased     General Gait Details: Cues for proximity to RW and upright posture. Pt noted to stand towards L of RW with a R anterior lean. Able to correct with cues.  Stairs            Wheelchair Mobility     Tilt Bed    Modified Rankin (Stroke Patients Only)       Balance Overall balance assessment: Mild deficits observed, not formally tested                                           Pertinent Vitals/Pain Pain Assessment Pain Assessment: 0-10 Pain Score: 8  Pain Location: Surgery site Pain Descriptors / Indicators: Aching, Discomfort, Grimacing, Sore Pain Intervention(s): Monitored during session, RN gave pain meds during session, Repositioned    Home Living Family/patient  expects to be discharged to:: Private residence Living Arrangements: Alone Available Help at Discharge: Family;Available PRN/intermittently;Friend(s);Neighbor Type of Home: Other(Comment) Counselling Psychologist) Home Access: Stairs to enter Entrance Stairs-Rails: None Entrance Stairs-Number of Steps: 2   Home Layout: One level Home Equipment: Agricultural Consultant (2  wheels);Shower seat [facility] RW) Additional Comments: Pt states that she plans to discharge to her camper at [id number] with her son this friday or saturday.  If she discharges before then pt states that she has friends who with stay with her at her current home until her son can pick her up.    Prior Function Prior Level of Function : Independent/Modified Independent             Mobility Comments: Pt reports moving slowly with RW ADLs Comments: Assume ind     Extremity/Trunk Assessment   Upper Extremity Assessment Upper Extremity Assessment: Overall WFL for tasks assessed    Lower Extremity Assessment Lower Extremity Assessment: Overall WFL for tasks assessed    Cervical / Trunk Assessment Cervical / Trunk Assessment: Back Surgery  Communication   Communication Communication: No apparent difficulties    Cognition Arousal: Alert Behavior During Therapy: WFL for tasks assessed/performed   PT - Cognitive impairments: No apparent impairments                         Following commands: Intact       Cueing Cueing Techniques: Verbal cues, Tactile cues     General Comments General comments (skin integrity, edema, etc.): VSS    Exercises     Assessment/Plan    PT Assessment Patient needs continued PT services  PT Problem List Decreased strength;Decreased range of motion;Decreased activity tolerance;Decreased balance;Decreased mobility;Decreased coordination;Decreased cognition;Decreased knowledge of use of DME;Decreased safety awareness;Cardiopulmonary status limiting activity;Decreased knowledge of precautions       PT Treatment Interventions Gait training;DME instruction;Stair training;Therapeutic activities;Functional mobility training;Balance training;Therapeutic exercise;Neuromuscular re-education;Cognitive remediation;Patient/family education    PT Goals (Current goals can be found in the Care Plan section)  Acute Rehab PT Goals Patient Stated  Goal: to get better PT Goal Formulation: With patient Time For Goal Achievement: 01/11/24 Potential to Achieve Goals: Good    Frequency Min 3X/week     Co-evaluation               AM-PAC PT 6 Clicks Mobility  Outcome Measure Help needed turning from your back to your side while in a flat bed without using bedrails?: A Little Help needed moving from lying on your back to sitting on the side of a flat bed without using bedrails?: A Little Help needed moving to and from a bed to a chair (including a wheelchair)?: A Little Help needed standing up from a chair using your arms (e.g., wheelchair or bedside chair)?: A Little Help needed to walk in hospital room?: A Little Help needed climbing 3-5 steps with a railing? : A Little 6 Click Score: 18    End of Session Equipment Utilized During Treatment: Gait belt;Back brace Activity Tolerance: Patient tolerated treatment well Patient left: in bed;with call bell/phone within reach Nurse Communication: Mobility status PT Visit Diagnosis: Other abnormalities of gait and mobility (R26.89)    Time: 8486-8459 PT Time Calculation (min) (ACUTE ONLY): 27 min   Charges:   PT Evaluation $PT Eval Moderate Complexity: 1 Mod PT Treatments $Gait Training: 8-22 mins PT General Charges $$ ACUTE PT VISIT: 1 Visit         Demitria Hay B, PT,  DPT Acute Rehab Services [phone]   Adelie Croswell 12/28/2023, 4:05 PM

## 2023-12-28 NOTE — H&P (Signed)
 Subjective: Patient is a 68 y.o. female admitted for right leg radiculopathy. Onset of symptoms was several months ago, gradually worsening since that time.  The pain is rated severe, and is located at the across the lower back and radiates to RLE. The pain is described as aching and occurs all day. The symptoms have been progressive. Symptoms are exacerbated by exercise, standing, and walking for more than a few minutes. MRI or CT showed lytic spondylolisthesis L5-s1   Past Medical History:  Diagnosis Date   ADHD (attention deficit hyperactivity disorder)    Anemia    Anxiety    ARDS (adult respiratory distress syndrome) (HCC) 2007   Arthritis    Depression    Gallstones 01/07/2018   Ganglion cyst of dorsum of right wrist    Hepatitis    2004 non detectable now   MRSA (methicillin resistant Staphylococcus aureus) 2007   Osteoporosis    Pneumonia 2009   Prosthetic eye globe    right eye    Raynaud's disease     Past Surgical History:  Procedure Laterality Date   ABDOMINAL AORTOGRAM W/LOWER EXTREMITY N/A 11/19/2022   Procedure: ABDOMINAL AORTOGRAM W/LOWER EXTREMITY;  Surgeon: Gretta Lonni PARAS, MD;  [facility];  Service: Cardiovascular;  Laterality: N/A;   ABDOMINAL HYSTERECTOMY     ANTERIOR CERVICAL DECOMP/DISCECTOMY FUSION N/A 12/12/2014   Procedure: ANTERIOR CERVICAL DECOMPRESSION/DISCECTOMY FUSION 3 LEVELS;  Surgeon: Oneil Priestly, MD;  Location: [facility] OR;  Service: Orthopedics;  Laterality: N/A;  Anterior cervical decompression fusion, cerivcal 5-6, cervical 6-7, cervical 7-thoracic 1 with instrumentation and allografrt   APLIGRAFT PLACEMENT Right 04/16/2022   Procedure: APPLICATION OF SKIN SUBSTITUTE GRAFT;  Surgeon: Elsa Lonni SAUNDERS, MD;  Location: [facility] OR;  Service: Orthopedics;  Laterality: Right;   APPENDECTOMY  1974   APPLICATION OF WOUND VAC Left 07/22/2022   Procedure: APPLICATION OF WOUND VAC AND MYRIAD;  Surgeon: Lowery Estefana RAMAN, DO;  Location: [facility]  OR;  Service: Plastics;  Laterality: Left;   CATARACT EXTRACTION Left 11/2018   CHOLECYSTECTOMY N/A 01/07/2018   Procedure: LAPAROSCOPIC CHOLECYSTECTOMY WITH INTRAOPERATIVE CHOLANGIOGRAM;  Surgeon: Gail Favorite, MD;  Location: [facility] OR;  Service: General;  Laterality: N/A;   COLONOSCOPY  04/14/2013   per Dr. Teressa, clear, repeat in 10 yrs    CORNEAL TRANSPLANT Right 2007   x4   ENUCLEATION Right 2013   eye infection   GANGLION CYST EXCISION Right 05/25/2016   Procedure: RIGHT WRIST ULNAR GANGLION EXCISION;  Surgeon: Alm Hummer, MD;  Location: [facility];  Service: Orthopedics;  Laterality: Right;   HARDWARE REMOVAL Right 08/20/2022   Procedure: HARDWARE REMOVAL;  Surgeon: Elsa Lonni SAUNDERS, MD;  Location: [facility] OR;  Service: Orthopedics;  Laterality: Right;   HEMATOMA EVACUATION Left 07/22/2022   Procedure: EVACUATION HEMATOMA LEFT BACK WITH EXCISION OF WOUND;  Surgeon: Lowery Estefana RAMAN, DO;  [facility];  Service: Plastics;  Laterality: Left;   I & D EXTREMITY Right 05/28/2022   Procedure: right ankle wound debridement with myriad placement;  Surgeon: Lowery Estefana RAMAN, DO;  Location: [facility];  Service: Plastics;  Laterality: Right;   I & D EXTREMITY Right 08/20/2022   Procedure: IRRIGATION AND DEBRIDEMENT EXTREMITY WITH WOUND VAC PLACEMENT;  Surgeon: Elsa Lonni SAUNDERS, MD;  Location: [facility] OR;  Service: Orthopedics;  Laterality: Right;   I & D EXTREMITY Right 11/26/2022   Procedure: EXCISIONAL DEBRIDEMENT OF NONHEALING RIGHT LEG WOUND, REPAIR OF WOUND DEHISCENCE, INCISION AND DRAINAGE OF DISTAL  LEG ABSCESS, RIGHT FIBULAR SAUCERIZATION, APPLICATION OF SKIN GRAFT SUBSTITUTE, WOUND VAC PLACEMENT;  Surgeon: Elsa Lonni SAUNDERS, MD;  Location: [facility];  Service: Orthopedics;  Laterality: Right;   INCISION AND DRAINAGE Right 07/22/2022   Procedure: INCISION AND DRAINAGE RIGHT ANKLE;  Surgeon: Lowery Estefana RAMAN, DO;  [facility]: [facility];  Service: Plastics;   Laterality: Right;   IR CHEST FLUORO  12/04/2022   IR PATIENT EVAL TECH 0-60 MINS  12/04/2022   MINOR HARDWARE REMOVAL Right 04/16/2022   Procedure: REPAIR OF SURGICAL WOUND DEHISCENCE RIGHT ANKLE, REMOVAL OF DEEP ORTHOPEDIC HARDWARE;  Surgeon: Elsa Lonni SAUNDERS, MD;  [facility];  Service: Orthopedics;  Laterality: Right;   ORIF CLAVICULAR FRACTURE Left 02/03/2022   Procedure: OPEN REDUCTION INTERNAL FIXATION (ORIF) LEFT CLAVICULAR FRACTURE WITH CORACOLAVICULAR FIXATION;  Surgeon: Dozier Soulier, MD;  [facility]: [facility];  Service: Orthopedics;  Laterality: Left;  BIOMET ZIP LOOP WITH CLAVICLE PLATES   ORIF FIBULA FRACTURE Right 02/03/2022   Procedure: OPEN TREATMENT OF RIGHT PILON ANKLE FRACTURE INCLUDING LATERAL MALLEOLUS FRACTURE;  Surgeon: Elsa Lonni SAUNDERS, MD;  Location: [facility] OR;  Service: Orthopedics;  Laterality: Right;  LENGTH OF SURGERY: 120 MINUTES   REVERSE SHOULDER ARTHROPLASTY Right 07/10/2021   Procedure: REVERSE SHOULDER ARTHROPLASTY;  Surgeon: Dozier Soulier, MD;  Location: [facility];  Service: Orthopedics;  Laterality: Right;   RHINOPLASTY  1980   ROBOTIC ASSISTED LAPAROSCOPIC SACROCOLPOPEXY Bilateral 06/13/2020   Procedure: XI ROBOTIC ASSISTED LAPAROSCOPIC SACROCOLPOPEXY AND SUPRACERVICAL HYSTERECTOMY AND BILATERAL  SALPINGO OOPHERECTOMY;  Surgeon: Cam Morene ORN, MD;  Location: [facility];  Service: Urology;  Laterality: Bilateral;   SYNDESMOSIS REPAIR Right 02/03/2022   Procedure: POSSIBLE OPEN TREATMENT RIGHT SYNDESMOSIS;  Surgeon: Elsa Lonni SAUNDERS, MD;  Location: [facility] OR;  Service: Orthopedics;  Laterality: Right;   TONSILLECTOMY     removed in 3rd grade   TOTAL SHOULDER ARTHROPLASTY Left 03/13/2019   Procedure: TOTAL SHOULDER ARTHROPLASTY;  Surgeon: Dozier Soulier, MD;  Location: [facility];  Service: Orthopedics;  Laterality: Left;    Prior to Admission medications   Medication Sig Start Date End Date Taking? Authorizing Provider   amoxicillin -clavulanate (AUGMENTIN ) 875-125 MG tablet Take 1 tablet by mouth 2 (two) times daily. 12/08/23  Yes Johnny Garnette LABOR, MD  amphetamine -dextroamphetamine  (ADDERALL  XR) 20 MG 24 hr capsule Take 1 capsule (20 mg total) by mouth every morning. 09/30/23  Yes Johnny Garnette LABOR, MD  cycloSPORINE  (RESTASIS ) 0.05 % ophthalmic emulsion Place 1 drop into both eyes 2 (two) times daily. 12/10/22  Yes   doxycycline  (ADOXA) 50 MG tablet Take 50 mg by mouth daily.   Yes [provider]  estradiol  (ESTRACE  VAGINAL) 0.1 MG/GM vaginal cream Apply a fingertip amount to urethra 2 times per week. Patient taking differently: Place 1 Applicatorful vaginally once a week. 09/30/21  Yes   HYDROmorphone  (DILAUDID ) 8 MG tablet Take 1 tablet (8 mg total) by mouth every 8 (eight) hours as needed for severe pain (pain score 7-10). 12/08/23  Yes Johnny Garnette LABOR, MD  Oxycodone  HCl 20 MG TABS Take 1 tablet (20 mg total) by mouth every 6 (six) hours as needed (pain). 12/08/23  Yes Johnny Garnette LABOR, MD  polyethylene glycol powder (GLYCOLAX /MIRALAX ) powder TAKE 17 GRAMS BY MOUTH 2 TIMES DAILY AS NEEDED. Patient taking differently: Take 17 g by mouth daily as needed for moderate constipation. 02/02/17  Yes Johnny Garnette LABOR, MD  pregabalin  (LYRICA ) 300 MG capsule Take 1 capsule (300 mg total) by mouth 2 (two) times daily.  10/22/23  Yes Johnny Garnette LABOR, MD  senna-docusate (SENOKOT-S) 8.6-50 MG tablet Take 1 tablet by mouth 2 (two) times daily. Patient taking differently: Take 1 tablet by mouth daily as needed for moderate constipation. 08/22/22  Yes Sheikh, Omair Latif, DO  valACYclovir  (VALTREX ) 1000 MG tablet Take 1 tablet (1,000 mg total) by mouth 3 (three) times daily. Patient taking differently: Take 1,000 mg by mouth daily. 08/03/22  Yes Johnny Garnette LABOR, MD  verapamil  (CALAN -SR) 120 MG CR tablet Take 1 tablet (120 mg total) by mouth at bedtime. 11/17/23  Yes Johnny Garnette LABOR, MD  amphetamine -dextroamphetamine  (ADDERALL  XR) 20 MG 24 hr  capsule Take 1 capsule (20 mg total) by mouth every morning. Patient not taking: Reported on 12/16/2023 09/30/23   Johnny Garnette LABOR, MD  amphetamine -dextroamphetamine  (ADDERALL  XR) 20 MG 24 hr capsule Take 1 capsule (20 mg total) by mouth every morning. Patient not taking: Reported on 12/16/2023 09/30/23   Johnny Garnette LABOR, MD  celecoxib  (CELEBREX ) 200 MG capsule Take 1 capsule (200 mg total) by mouth 2 (two) times daily. Patient not taking: Reported on 12/16/2023 11/05/22   Johnny Garnette LABOR, MD  diazepam  (VALIUM ) 5 MG tablet TAKE ONE TABLET BY MOUTH EVERY 8 HOURS AS NEEDED FOR ANXIETY Patient not taking: Reported on 12/16/2023 10/22/23   Johnny Garnette LABOR, MD  nystatin  (MYCOSTATIN ) 100000 UNIT/ML suspension Take 5 mLs (500,000 Units total) by mouth 4 (four) times daily. Patient not taking: Reported on 12/16/2023 05/27/22   Johnny Garnette LABOR, MD  ondansetron  (ZOFRAN ) 8 MG tablet Take 1 tablet (8 mg total) by mouth every 8 (eight) hours as needed for nausea or vomiting. Patient not taking: Reported on 12/16/2023 12/08/22   Overton Faith T, MD  tobramycin -dexamethasone  (TOBRADEX ) ophthalmic solution Place 2 drops into both eyes every 4 (four) hours while awake. Patient not taking: Reported on 12/16/2023 03/24/23   Johnny Garnette LABOR, MD   No Known Allergies  Social History   Tobacco Use   Smoking status: Never   Smokeless tobacco: Never  Substance Use Topics   Alcohol  use: Not Currently    Comment: occ    Family History  Problem Relation Age of Onset   Breast cancer Maternal Aunt 60   Colon cancer Neg Hx      Review of Systems  Positive ROS: neg  All other systems have been reviewed and were otherwise negative with the exception of those mentioned in the HPI and as above.  Objective: Vital signs in last 24 hours: Temp:  [98.1 F (36.7 C)] 98.1 F (36.7 C) (12/02 0604) Pulse Rate:  [74] 74 (12/02 0604) Resp:  [18] 18 (12/02 0604) BP: (155)/(82) 155/82 (12/02 0604) SpO2:  [90 %] 90 % (12/02  0604) Weight:  [48.5 kg] 48.5 kg (12/02 0604)  General Appearance: Alert, cooperative, no distress, appears stated age Head: Normocephalic, without obvious abnormality, atraumatic Eyes: PERRL, conjunctiva/corneas clear, EOM's intact    Neck: Supple, symmetrical, trachea midline Back: Symmetric, no curvature, ROM normal, no CVA tenderness Lungs:  respirations unlabored Heart: Regular rate and rhythm Abdomen: Soft, non-tender Extremities: Extremities normal, atraumatic, no cyanosis or edema Pulses: 2+ and symmetric all extremities Skin: Skin color, texture, turgor normal, no rashes or lesions  NEUROLOGIC:   Mental status: Alert and oriented x4,  no aphasia, good attention span, fund of knowledge, and memory Motor Exam - grossly normal Sensory Exam - grossly normal Reflexes: 1+ Coordination - grossly normal Gait - grossly normal Balance - grossly normal Cranial Nerves: I:  smell Not tested  II: visual acuity  OS: nl    OD: nl  II: visual fields Full to confrontation  II: pupils Equal, round, reactive to light  III,VII: ptosis None  III,IV,VI: extraocular muscles  Full ROM  V: mastication Normal  V: facial light touch sensation  Normal  V,VII: corneal reflex  Present  VII: facial muscle function - upper  Normal  VII: facial muscle function - lower Normal  VIII: hearing Not tested  IX: soft palate elevation  Normal  IX,X: gag reflex Present  XI: trapezius strength  5/5  XI: sternocleidomastoid strength 5/5  XI: neck flexion strength  5/5  XII: tongue strength  Normal    Data Review Lab Results  Component Value Date   WBC 6.1 12/20/2023   HGB 13.7 12/20/2023   HCT 42.0 12/20/2023   MCV 94.2 12/20/2023   PLT 156 12/20/2023   Lab Results  Component Value Date   NA 135 12/20/2023   K 4.0 12/20/2023   CL 96 (L) 12/20/2023   CO2 27 12/20/2023   BUN 19 12/20/2023   CREATININE 0.71 12/20/2023   GLUCOSE 92 12/20/2023   Lab Results  Component Value Date   INR 1.0  07/22/2022    Assessment/Plan:  Estimated body mass index is 19.89 kg/m as calculated from the following:   Height as of this encounter: 5' 1.5 (1.562 m).   Weight as of this encounter: 48.5 kg. Patient admitted for PLIF L5-S1. Patient has failed a reasonable attempt at conservative therapy.  I explained the condition and procedure to the patient and answered any questions.  Patient wishes to proceed with procedure as planned. Understands risks/ benefits and typical outcomes of procedure.   Alm GORMAN Molt 12/28/2023 7:31 AM

## 2023-12-28 NOTE — Anesthesia Postprocedure Evaluation (Signed)
 Anesthesia Post Note  Patient: [name]  Procedure(s) Performed: LUMBAR FIVE-SACRAL ONE POSTERIOR LUMBAR INTERBODY FUSION (Back)     Patient location during evaluation: PACU Anesthesia Type: General Level of consciousness: awake and alert Pain management: pain level controlled Vital Signs Assessment: post-procedure vital signs reviewed and stable Respiratory status: spontaneous breathing, nonlabored ventilation, respiratory function stable and patient connected to nasal cannula oxygen Cardiovascular status: blood pressure returned to baseline and stable Postop Assessment: no apparent nausea or vomiting Anesthetic complications: no   No notable events documented.  Last Vitals:  Vitals:   12/28/23 1115 12/28/23 1130  BP: (!) 148/94 120/77  Pulse: 84 80  Resp: 15 12  Temp:    SpO2: 98% 96%    Last Pain:  Vitals:   12/28/23 1130  TempSrc:   PainSc: 10-Worst pain ever    LLE Motor Response: Purposeful movement (12/28/23 1130) LLE Sensation: Full sensation (12/28/23 1130) RLE Motor Response: Purposeful movement (12/28/23 1130) RLE Sensation: Full sensation (12/28/23 1130)      Garnette FORBES Skillern

## 2023-12-28 NOTE — Plan of Care (Signed)

## 2023-12-28 NOTE — Op Note (Signed)
 12/28/2023  10:50 AM  PATIENT:  [name]  68 y.o. female  PRE-OPERATIVE DIAGNOSIS: Lytic spondylolisthesis L5-S1 with foraminal stenosis and right L5 radiculopathy  POST-OPERATIVE DIAGNOSIS:  same  PROCEDURE:   1. Decompressive lumbar laminectomy, hemi facetectomy and foraminotomies L5-S1 requiring more work than would be required for a simple exposure of the disk for PLIF in order to adequately decompress the neural elements and address the spinal stenosis, this was a Gill type decompression 2. Posterior lumbar interbody fusion L5-S1 using PTI interbody cages packed with morcellized allograft and autograft  3. Posterior fixation L5-S1 using Alphatec cortical pedicle screws.  4. Intertransverse arthrodesis L5-S1 using morcellized autograft and allograft.  SURGEON:  Alm Molt, MD  ASSISTANTS: Suzen Pean, FNP  ANESTHESIA:  General  EBL: 200 ml  Total I/O In: -  Out: 200 [Blood:200]  BLOOD ADMINISTERED:none  DRAINS: none   INDICATION FOR PROCEDURE: This patient presented with back pain and right leg pain. Imaging revealed lytic spondylolisthesis L5-S1 with a right sided disc herniation and foraminal stenosis compressing the right L5 nerve root. The patient tried a reasonable attempt at conservative medical measures without relief. I recommended decompression and instrumented fusion to address the stenosis as well as the segmental  instability.  Patient understood the risks, benefits, and alternatives and potential outcomes and wished to proceed.  She was a patient of Dr. Louis, who had emergency surgery yesterday and she is decided to move forward with the surgery with me.  I have discussed the surgery with her in detail.  We change the approach from anterior to posterior.  I have discussed the advantages and disadvantages with her.  She understands the risk of the surgery include but are not limited to bleeding, infection, nerve root injury, CSF leak, numbness,  weakness, loss of bowel bladder or sexual function, pseudoarthrosis, misplaced or broken hardware, need for further surgery, lack of relief of symptoms, worsening symptoms, vascular injury, need for blood transfusion, and anesthesia risk.  She agrees to proceed  PROCEDURE DETAILS:  The patient was brought to the operating room. After induction of generalized endotracheal anesthesia the patient was rolled into the prone position on chest rolls and all pressure points were padded. The patient's lumbar region was cleaned and then prepped with DuraPrep and draped in the usual sterile fashion. Anesthesia was injected and then a dorsal midline incision was made and carried down to the lumbosacral fascia. The fascia was opened and the paraspinous musculature was taken down in a subperiosteal fashion to expose L5-S1. A self-retaining retractor was placed. Intraoperative fluoroscopy confirmed my level, and I started with the decompression  I removed the spinous process of L5 and complete lumbar laminectomies, hemi- facetectomies, and foraminotomies were performed at L5-S1.  My nurse practitioner was directly involved in the decompression and exposure of the neural elements. the patient had significant spinal stenosis and this required more work than would be required for a simple exposure of the disc for posterior lumbar interbody fusion which would only require a limited laminotomy.  We did a Gill type decompression because of the spondylolysis.  Much more generous decompression and generous foraminotomy was undertaken in order to adequately decompress the neural elements and address the patient's leg pain. The yellow ligament was removed to expose the underlying dura and nerve roots, and generous foraminotomies were performed to adequately decompress the neural elements.  Unfortunately a small unintended durotomy was created which was then closed with a 6-0 Prolene suture.  Both the exiting and  traversing nerve roots  were decompressed on both sides until a coronary dilator passed easily along the nerve roots. Once the decompression was complete, I turned my attention to the posterior lower lumbar interbody fusion. The epidural venous vasculature was coagulated and cut sharply. Disc space was incised and the initial discectomy was performed with pituitary rongeurs. The disc space was distracted with sequential distractors to a height of 9 mm. We then used a series of scrapers and shavers to prepare the endplates for fusion. The midline was prepared with Epstein curettes. Once the complete discectomy was finished, we packed an appropriate sized interbody cage with local autograft and morcellized allograft, gently retracted the nerve root, and tapped the cage into position at L5-S1.  The midline between the cages was packed with morselized autograft and allograft.   We then turned our attention to the placement of the pedicle screws. The pedicle screw entry zones were identified utilizing surface landmarks and fluoroscopy. I drilled into each pedicle utilizing the hand drill, and tapped each pedicle with the appropriate tap. We palpated with a ball probe to assure no break in the cortex. We then placed 5.5 x 45 mm cortical screws into the pedicles bilaterally at L5, and 6.5 x 40 mm pedicle screws at S1..  My nurse practitioner assisted in placement of the pedicle screws.  We then decorticated the transverse processes and laid a mixture of morcellized autograft and allograft out over these to perform intertransverse arthrodesis at L5-S1. We then placed lordotic rods into the multiaxial screw heads of the pedicle screws and locked these in position with the locking caps and anti-torque device. We then checked our construct with AP and lateral fluoroscopy. Irrigated with copious amounts of 0.5% povidone iodine  solution followed by saline solution. Inspected the nerve roots once again to assure adequate decompression, lined to the  dura with Gelfoam,  and then we closed the muscle and the fascia with 0 Vicryl. Closed the subcutaneous tissues with 2-0 Vicryl and subcuticular tissues with 3-0 Vicryl. The skin was closed with benzoin and Steri-Strips. Dressing was then applied, the patient was awakened from general anesthesia and transported to the recovery room in stable condition. At the end of the procedure all sponge, needle and instrument counts were correct.   PLAN OF CARE: admit to inpatient  PATIENT DISPOSITION:  PACU - hemodynamically stable.   Delay start of Pharmacological VTE agent (>24hrs) due to surgical blood loss or risk of bleeding:  yes

## 2023-12-29 DIAGNOSIS — M5416 Radiculopathy, lumbar region: Secondary | ICD-10-CM | POA: Diagnosis not present

## 2023-12-29 MED ORDER — TIZANIDINE HCL 4 MG PO TABS
4.0000 mg | ORAL_TABLET | Freq: Four times a day (QID) | ORAL | Status: DC | PRN
  Administered 2023-12-29 – 2023-12-30 (×4): 4 mg via ORAL
  Filled 2023-12-29 (×4): qty 1

## 2023-12-29 NOTE — TOC Transition Note (Signed)
 Transition of Care [facility]) - Discharge Note   Patient Details  Name: [name] MRN: [id number] Date of Birth: December 17, 1955  Transition of Care [facility]) CM/SW Contact:  Andrez JULIANNA George, RN Phone Number: 12/29/2023, 11:21 AM   Clinical Narrative:     Pt is discharging to her sons home in [place]: [address] [phone]. Home health arranged with [facility]. Information on the AVS. [facility] will contact her for the first home visit. Son to transport pt to [employer].  Final next level of care: Home w Home Health Services Barriers to Discharge: No Barriers Identified   Patient Goals and CMS Choice   CMS Medicare.gov Compare Post Acute Care list provided to:: Patient Choice offered to / list presented to : Patient      Discharge Placement                       Discharge Plan and Services Additional resources added to the After Visit Summary for                            [facility] Arranged: PT, [facility] Agency: [facility] Services Date [facility] Agency Contacted: 12/29/23   Representative spoke with at [facility] Agency: [place]  Social Drivers of Health (SDOH) Interventions SDOH Screenings   Food Insecurity: No Food Insecurity (11/26/2022)  Housing: Low Risk  (11/26/2022)  Transportation Needs: No Transportation Needs (11/26/2022)  Utilities: Not At Risk (11/26/2022)  Alcohol  Screen: Low Risk  (08/14/2021)  Depression (PHQ2-9): Low Risk  (02/18/2023)  Financial Resource Strain: Low Risk (06/08/2022)   [facility] from [facility]  Physical Activity: Sufficiently Active (08/14/2021)  Social Connections: Moderately Isolated (08/14/2021)  Stress: No Stress Concern Present (08/14/2021)  Tobacco Use: Low Risk  (12/28/2023)     Readmission Risk Interventions    11/30/2022    2:15 PM  Readmission Risk Prevention Plan  Post Dischage Appt Complete  Medication Screening Complete  Transportation Screening Complete

## 2023-12-29 NOTE — Care Management Obs Status (Signed)
 MEDICARE OBSERVATION STATUS NOTIFICATION   Patient Details  Name: [name] MRN: [id number] Date of Birth: 1955/03/01   Medicare Observation Status Notification Given:  Yes    Andrez JULIANNA George, RN 12/29/2023, 9:37 AM

## 2023-12-29 NOTE — Evaluation (Signed)
 Occupational Therapy Evaluation Patient Details Name: [name] MRN: [id number] DOB: Nov 04, 1955 Today's Date: 12/29/2023   History of Present Illness   68 yo female s/p 12/2 L5-s1 PLIF PMH ORIF R fibular, L TSA, ACDF, ORIF L clavicle, ADHD, ARDS, Osteoporosis, R prosthetic eye, HTN, MRSA, chronic narcotic dependence     Clinical Impressions PTA, pt lived alone and reports being independent; prior to surgery, reporting self care had become difficulty secondary to back pain and being hunched over. Upon eval, pt performing UB ADL with set-up and LB ADL with CGA. Pt educated and demonstrating use of compensatory techniques for bed mobility, LB ADL, grooming, toileting, brace application, and shower transfers within precautions. All education provided and questions answered, however, pt needing min cues and mildly unsteady OOB, thus will follow acutely. Recommending discharge home following physician orders for discharge.         If plan is discharge home, recommend the following:   A little help with walking and/or transfers;A little help with bathing/dressing/bathroom;Assistance with cooking/housework;Assist for transportation;Help with stairs or ramp for entrance     Functional Status Assessment   Patient has had a recent decline in their functional status and demonstrates the ability to make significant improvements in function in a reasonable and predictable amount of time.     Equipment Recommendations   None recommended by OT     Recommendations for Other Services         Precautions/Restrictions   Precautions Precautions: Fall;Back Precaution Booklet Issued: Yes (comment) Recall of Precautions/Restrictions: Intact Required Braces or Orthoses: Spinal Brace Spinal Brace: Lumbar corset;Applied in sitting position Restrictions Weight Bearing Restrictions Per Provider Order: No     Mobility Bed Mobility Overal bed mobility: Needs Assistance Bed  Mobility: Rolling, Sidelying to Sit, Sit to Sidelying Rolling: Supervision Sidelying to sit: Supervision     Sit to sidelying: Supervision General bed mobility comments: cues for technique as pt with multiple episodes attempting to come to long sit in bed    Transfers Overall transfer level: Needs assistance Equipment used: Rolling walker (2 wheels) Transfers: Sit to/from Stand Sit to Stand: Supervision                  Balance Overall balance assessment: Needs assistance Sitting-balance support: No upper extremity supported, Feet supported Sitting balance-Leahy Scale: Fair     Standing balance support: During functional activity, No upper extremity supported Standing balance-Leahy Scale: Fair Standing balance comment: statically                           ADL either performed or assessed with clinical judgement   ADL Overall ADL's : Needs assistance/impaired     Grooming: Supervision/safety;Wash/dry hands;Standing   Upper Body Bathing: Set up;Sitting   Lower Body Bathing: Contact guard assist;Sit to/from stand   Upper Body Dressing : Set up;Sitting   Lower Body Dressing: Contact guard assist;Sit to/from stand   Toilet Transfer: Contact guard assist;Ambulation;Rolling walker (2 wheels)   Toileting- Clothing Manipulation and Hygiene: Modified independent;Sitting/lateral lean   Tub/ Shower Transfer: Contact guard assist;Ambulation;Walk-in shower   Functional mobility during ADLs: Contact guard assist;Rolling walker (2 wheels)       Vision   Additional Comments: pt has R eye prosthesis     Perception         Praxis         Pertinent Vitals/Pain Pain Assessment Pain Assessment: Faces Faces Pain Scale: Hurts even more Pain Location: Surgery  site Pain Descriptors / Indicators: Aching, Discomfort, Grimacing, Sore Pain Intervention(s): Limited activity within patient's tolerance, Monitored during session     Extremity/Trunk Assessment  Upper Extremity Assessment Upper Extremity Assessment: Overall WFL for tasks assessed   Lower Extremity Assessment Lower Extremity Assessment: Defer to PT evaluation   Cervical / Trunk Assessment Cervical / Trunk Assessment: Back Surgery   Communication Communication Communication: No apparent difficulties   Cognition Arousal: Alert Behavior During Therapy: WFL for tasks assessed/performed Cognition: No apparent impairments                               Following commands: Intact       Cueing  General Comments   Cueing Techniques: Verbal cues;Tactile cues  VSS   Exercises     Shoulder Instructions      Home Living Family/patient expects to be discharged to:: Private residence Living Arrangements: Alone (had been loving here in [place] alone; Most recently in a camper in [place] nearby or in son's yard.) Available Help at Discharge: Family;Available PRN/intermittently;Friend(s);Neighbor Type of Home: Other(Comment) (camper) Home Access: Stairs to enter Entrance Stairs-Number of Steps: 2 Entrance Stairs-Rails: Left Home Layout: One level (2 stairs up to bed with rail on L)     Bathroom Shower/Tub: Producer, Television/film/video: Standard     Home Equipment: Agricultural Consultant (2 wheels);Shower seat;BSC/3in1 (petite RW)   Additional Comments: Pt states that she plans to discharge to her camper at [place] with her son this friday or saturday.  If she discharges before then pt states that she has friends who with stay with her at her current home until her son can pick her up.      Prior Functioning/Environment Prior Level of Function : Independent/Modified Independent             Mobility Comments: Pt reports moving slowly with RW ADLs Comments: Assume ind    OT Problem List: Decreased strength;Decreased activity tolerance;Impaired balance (sitting and/or standing)   OT Treatment/Interventions: Self-care/ADL training;Therapeutic  exercise;DME and/or AE instruction;Therapeutic activities;Patient/family education;Balance training      OT Goals(Current goals can be found in the care plan section)   Acute Rehab OT Goals Patient Stated Goal: get better OT Goal Formulation: With patient Time For Goal Achievement: 01/12/24 Potential to Achieve Goals: Good   OT Frequency:  Min 2X/week    Co-evaluation              AM-PAC OT 6 Clicks Daily Activity     Outcome Measure Help from another person eating meals?: None Help from another person taking care of personal grooming?: A Little Help from another person toileting, which includes using toliet, bedpan, or urinal?: A Little Help from another person bathing (including washing, rinsing, drying)?: A Little Help from another person to put on and taking off regular upper body clothing?: A Little Help from another person to put on and taking off regular lower body clothing?: A Little 6 Click Score: 19   End of Session Equipment Utilized During Treatment: Gait belt;Rolling walker (2 wheels);Back brace Nurse Communication: Mobility status  Activity Tolerance: Patient tolerated treatment well Patient left: in bed;with call bell/phone within reach  OT Visit Diagnosis: Unsteadiness on feet (R26.81);Muscle weakness (generalized) (M62.81);Pain                Time: 9168-9097 OT Time Calculation (min): 31 min Charges:  OT General Charges $OT Visit: 1 Visit OT Evaluation $  OT Eval Low Complexity: 1 Low OT Treatments $Self Care/Home Management : 8-22 mins  [name], OTR/L [facility] Acute Rehabilitation Office: [phone]   Beverly JONETTA Lebron 12/29/2023, 9:41 AM

## 2023-12-29 NOTE — Progress Notes (Signed)
 Physical Therapy Treatment  Patient Details Name: [name] MRN: [id number] DOB: 1955/09/06 Today's Date: 12/29/2023   History of Present Illness Pt is a 68 y/o female s/p L5-S1 PLIF on 12/28/23. PMH significant for ORIF R fibular fx, L TSA, ACDF, ORIF L clavicle fx, ADHD, ARDS, Osteoporosis, R prosthetic eye, HTN, MRSA, chronic narcotic dependence.    PT Comments  Pt progressing well with post-op mobility. She was able to demonstrate transfers and ambulation with gross CGA to supervision for safety and RW for support. Deferred stair training this session due to reported high pain and spasms after extended hallway ambulation yesterday and reports of increasing pain levels during gait training this session. Reinforced education on precautions, brace application/wearing schedule, appropriate activity progression, and car transfer. Will continue to follow.      If plan is discharge home, recommend the following: A little help with walking and/or transfers;A little help with bathing/dressing/bathroom;Help with stairs or ramp for entrance;Assist for transportation;Assistance with cooking/housework   Can travel by private vehicle        Equipment Recommendations  None recommended by PT    Recommendations for Other Services       Precautions / Restrictions Precautions Precautions: Fall;Back Precaution Booklet Issued: Yes (comment) Recall of Precautions/Restrictions: Intact Precaution/Restrictions Comments: Reviewed handout and pt was cued for precautions during functional mobility. Required Braces or Orthoses: Spinal Brace Spinal Brace: Lumbar corset;Applied in sitting position Restrictions Weight Bearing Restrictions Per Provider Order: No     Mobility  Bed Mobility Overal bed mobility: Needs Assistance Bed Mobility: Rolling, Sidelying to Sit, Sit to Sidelying Rolling: Supervision Sidelying to sit: Supervision     Sit to sidelying: Supervision General bed mobility  comments: Cues for technique as pt with multiple episodes attempting to come to long sit in bed. HOB flat and rails up to simulate home environment.    Transfers Overall transfer level: Needs assistance Equipment used: Rolling walker (2 wheels) Transfers: Sit to/from Stand Sit to Stand: Supervision           General transfer comment: Cues for hand placement. Able to stand from low bed and toilet heights.    Ambulation/Gait Ambulation/Gait assistance: Contact guard assist Gait Distance (Feet): 100 Feet Assistive device: Rolling walker (2 wheels) Gait Pattern/deviations: Shuffle, Narrow base of support, Trunk flexed, Decreased stride length, Step-through pattern Gait velocity: decreased Gait velocity interpretation: <1.8 ft/sec, indicate of risk for recurrent falls   General Gait Details: VC's for wider BOS and step-through gait pattern. Pt reports she had bad back spasms after ambulation in hall yesterday, so conservative gait distance was maintained this session.   Stairs             Wheelchair Mobility     Tilt Bed    Modified Rankin (Stroke Patients Only)       Balance Overall balance assessment: Needs assistance Sitting-balance support: No upper extremity supported, Feet supported Sitting balance-Leahy Scale: Fair     Standing balance support: During functional activity, No upper extremity supported Standing balance-Leahy Scale: Fair Standing balance comment: statically                            Communication Communication Communication: No apparent difficulties  Cognition Arousal: Alert Behavior During Therapy: WFL for tasks assessed/performed   PT - Cognitive impairments: No apparent impairments  Following commands: Intact      Cueing Cueing Techniques: Verbal cues, Tactile cues  Exercises      General Comments General comments (skin integrity, edema, etc.): ADL tasks in bathroom (brushing  teeth and washing face), cues throughout for optimal technique and maintenance of precautions.      Pertinent Vitals/Pain Pain Assessment Pain Assessment: Faces Faces Pain Scale: Hurts even more Pain Location: Surgery site Pain Descriptors / Indicators: Aching, Discomfort, Grimacing, Sore Pain Intervention(s): Limited activity within patient's tolerance, Monitored during session, Repositioned    Home Living Family/patient expects to be discharged to:: Private residence Living Arrangements: Alone (had been loving here in [place] alone; Most recently in a camper in [place] nearby or in son's yard.) Available Help at Discharge: Family;Available PRN/intermittently;Friend(s);Neighbor Type of Home: Other(Comment) (camper) Home Access: Stairs to enter Entrance Stairs-Rails: Left Entrance Stairs-Number of Steps: 2   Home Layout: One level (2 stairs up to bed with rail on L) Home Equipment: Rolling Walker (2 wheels);Shower seat;BSC/3in1 (petite RW) Additional Comments: Pt states that she plans to discharge to her camper at [place] with her son this friday or saturday.  If she discharges before then pt states that she has friends who with stay with her at her current home until her son can pick her up.    Prior Function            PT Goals (current goals can now be found in the care plan section) Acute Rehab PT Goals Patient Stated Goal: to get better PT Goal Formulation: With patient Time For Goal Achievement: 01/11/24 Potential to Achieve Goals: Good Progress towards PT goals: Progressing toward goals    Frequency    Min 3X/week      PT Plan      Co-evaluation              AM-PAC PT 6 Clicks Mobility   Outcome Measure  Help needed turning from your back to your side while in a flat bed without using bedrails?: A Little Help needed moving from lying on your back to sitting on the side of a flat bed without using bedrails?: A Little Help needed moving  to and from a bed to a chair (including a wheelchair)?: A Little Help needed standing up from a chair using your arms (e.g., wheelchair or bedside chair)?: A Little Help needed to walk in hospital room?: A Little Help needed climbing 3-5 steps with a railing? : A Little 6 Click Score: 18    End of Session Equipment Utilized During Treatment: Gait belt;Back brace Activity Tolerance: Patient tolerated treatment well Patient left: in bed;with call bell/phone within reach Nurse Communication: Mobility status PT Visit Diagnosis: Other abnormalities of gait and mobility (R26.89)     Time: 8979-8956 PT Time Calculation (min) (ACUTE ONLY): 23 min  Charges:    $Gait Training: 23-37 mins PT General Charges $$ ACUTE PT VISIT: 1 Visit                     Leita Sable, PT, DPT Acute Rehabilitation Services Secure Chat Preferred Office: [phone]    Leita JONETTA Sable 12/29/2023, 10:58 AM

## 2023-12-29 NOTE — Progress Notes (Signed)
 Patient ID: [name], female   DOB: [birth year], 68 y.o.   MRN: [id number] Subjective: Patient reports appropriate postoperative soreness in the back.  She had a little left leg aching with walking.  Right leg pain is gone.  Overall she feels like she is doing well.  Objective: Vital signs in last 24 hours: Temp:  [97.6 F (36.4 C)-98.1 F (36.7 C)] 97.9 F (36.6 C) (12/03 0820) Pulse Rate:  [63-83] 75 (12/03 0820) Resp:  [10-18] 17 (12/03 0820) BP: (106-184)/(57-99) 114/62 (12/03 0820) SpO2:  [91 %-100 %] 98 % (12/03 0820)  Intake/Output from previous day: 12/02 0701 - 12/03 0700 In: 1000 [I.V.:1000] Out: 550 [Urine:350; Blood:200] Intake/Output this shift: No intake/output data recorded.  Neurologic: Grossly normal  Lab Results: Lab Results  Component Value Date   WBC 6.1 12/20/2023   HGB 13.7 12/20/2023   HCT 42.0 12/20/2023   MCV 94.2 12/20/2023   PLT 156 12/20/2023   Lab Results  Component Value Date   INR 1.0 07/22/2022   BMET Lab Results  Component Value Date   NA 135 12/20/2023   K 4.0 12/20/2023   CL 96 (L) 12/20/2023   CO2 27 12/20/2023   GLUCOSE 92 12/20/2023   BUN 19 12/20/2023   CREATININE 0.71 12/20/2023   CALCIUM  9.2 12/20/2023    Studies/Results: DG Lumbar Spine 2-3 Views Result Date: 12/28/2023 CLINICAL DATA:  L5-S1 fusion. EXAM: LUMBAR SPINE - 2-3 VIEW COMPARISON:  Lumbar spine CT dated 02/19/2010. FINDINGS: PA and lateral C-arm images of the lumbosacral region demonstrate interval interbody and pedicle screw and rod fusion at the L5-S1 level. Anatomic alignment. IMPRESSION: Interval L5-S1 fusion with anatomic alignment. Electronically Signed   By: Elspeth Bathe M.D.   On: 12/28/2023 15:16   DG C-Arm 1-60 Min-No Report Result Date: 12/28/2023 Fluoroscopy was utilized by the requesting physician.  No radiographic interpretation.   DG C-Arm 1-60 Min-No Report Result Date: 12/28/2023 Fluoroscopy was utilized by the requesting  physician.  No radiographic interpretation.   DG C-Arm 1-60 Min-No Report Result Date: 12/28/2023 Fluoroscopy was utilized by the requesting physician.  No radiographic interpretation.    Assessment/Plan: Overall doing fairly well postop day 1.  Unfortunately she has no help at home and has frequent falls.  Therefore I do not feel safe sending her home alone today.  Her son comes tomorrow.  We will plan on discharge tomorrow with home health.  Estimated body mass index is 19.89 kg/m as calculated from the following:   Height as of this encounter: 5' 1.5 (1.562 m).   Weight as of this encounter: 48.5 kg.    LOS: 0 days    [name] 12/29/2023, 11:37 AM

## 2023-12-30 DIAGNOSIS — M5416 Radiculopathy, lumbar region: Secondary | ICD-10-CM | POA: Diagnosis not present

## 2023-12-30 MED ORDER — TIZANIDINE HCL 4 MG PO TABS: 4.0000 mg | ORAL_TABLET | Freq: Three times a day (TID) | ORAL | 1 refills | Status: AC

## 2023-12-30 NOTE — Progress Notes (Signed)
 An After Visit Summary was printed and given to the patient. Patient prescriptions are sent to the patient's pharmacy. D/c education completed with patient/family including follow up instructions, medication list, d/c activities limitations if indicated, with other d/c instructions as indicated by MD - patient able to verbalize understanding, all questions fully answered. Patient instructed to return to ED, call 911, or call MD for any changes in condition.  Patient escorted via WC, and D/C home via private auto.

## 2023-12-30 NOTE — Discharge Summary (Signed)
 Physician Discharge Summary  Patient ID: [name] MRN: [id number] DOB/AGE: [birth year] 66 y.o.  Admit date: 12/28/2023 Discharge date: 12/30/2023  Admission Diagnoses: Lytic spondylolisthesis L5-S1 with foraminal stenosis and right L5 radiculopathy     Discharge Diagnoses: same   Discharged Condition: good  Hospital Course: The patient was admitted on 12/28/2023 and taken to the operating room where the patient underwent PLIF L5-S1. The patient tolerated the procedure well and was taken to the recovery room and then to the floor in stable condition. The hospital course was routine. There were no complications. The wound remained clean dry and intact. Pt had appropriate back soreness. No complaints of leg pain or new N/T/W. The patient remained afebrile with stable vital signs, and tolerated a regular diet. The patient continued to increase activities, and pain was well controlled with oral pain medications.   Consults: None  Significant Diagnostic Studies:  Results for orders placed or performed during the hospital encounter of 12/20/23  Surgical pcr screen   Collection Time: 12/20/23  2:38 PM   Specimen: Nasal Mucosa; Nasal Swab  Result Value Ref Range   MRSA, PCR NEGATIVE NEGATIVE   Staphylococcus aureus NEGATIVE NEGATIVE  Comprehensive metabolic panel per protocol   Collection Time: 12/20/23  3:00 PM  Result Value Ref Range   Sodium 135 135 - 145 mmol/L   Potassium 4.0 3.5 - 5.1 mmol/L   Chloride 96 (L) 98 - 111 mmol/L   CO2 27 22 - 32 mmol/L   Glucose, Bld 92 70 - 99 mg/dL   BUN 19 8 - 23 mg/dL   Creatinine, Ser 9.28 0.44 - 1.00 mg/dL   Calcium  9.2 8.9 - 10.3 mg/dL   Total Protein 8.0 6.5 - 8.1 g/dL   Albumin 4.2 3.5 - 5.0 g/dL   AST 35 15 - 41 U/L   ALT 29 0 - 44 U/L   Alkaline Phosphatase 79 38 - 126 U/L   Total Bilirubin 1.4 (H) 0.0 - 1.2 mg/dL   GFR, Estimated >39 >39 mL/min   Anion gap 12 5 - 15  CBC per protocol   Collection Time: 12/20/23  3:00 PM   Result Value Ref Range   WBC 6.1 4.0 - 10.5 K/uL   RBC 4.46 3.87 - 5.11 MIL/uL   Hemoglobin 13.7 12.0 - 15.0 g/dL   HCT 57.9 63.9 - 53.9 %   MCV 94.2 80.0 - 100.0 fL   MCH 30.7 26.0 - 34.0 pg   MCHC 32.6 30.0 - 36.0 g/dL   RDW 86.4 88.4 - 84.4 %   Platelets 156 150 - 400 K/uL   nRBC 0.0 0.0 - 0.2 %  Type and screen   Collection Time: 12/20/23  3:02 PM  Result Value Ref Range   ABO/RH(D) A NEG    Antibody Screen NEG    Sample Expiration 01/03/2024,2359    Extend sample reason      NO TRANSFUSIONS OR PREGNANCY IN THE PAST 3 MONTHS Performed at [facility], [address], [place], [place] [address]    *Note: Due to a large number of results and/or encounters for the requested time period, some results have not been displayed. A complete set of results can be found in Results Review.    DG Lumbar Spine 2-3 Views Result Date: 12/28/2023 CLINICAL DATA:  L5-S1 fusion. EXAM: LUMBAR SPINE - 2-3 VIEW COMPARISON:  Lumbar spine CT dated 02/19/2010. FINDINGS: PA and lateral C-arm images of the lumbosacral region demonstrate interval interbody and pedicle  screw and rod fusion at the L5-S1 level. Anatomic alignment. IMPRESSION: Interval L5-S1 fusion with anatomic alignment. Electronically Signed   By: Elspeth Bathe M.D.   On: 12/28/2023 15:16   DG C-Arm 1-60 Min-No Report Result Date: 12/28/2023 Fluoroscopy was utilized by the requesting physician.  No radiographic interpretation.   DG C-Arm 1-60 Min-No Report Result Date: 12/28/2023 Fluoroscopy was utilized by the requesting physician.  No radiographic interpretation.   DG C-Arm 1-60 Min-No Report Result Date: 12/28/2023 Fluoroscopy was utilized by the requesting physician.  No radiographic interpretation.    Antibiotics:  Anti-infectives (From admission, onward)    Start     Dose/Rate Route Frequency Ordered Stop   12/28/23 1400  doxycycline  (VIBRA -TABS) tablet 50 mg        50 mg Oral Daily 12/28/23 1304     12/28/23 1400   ceFAZolin  (ANCEF ) IVPB 2g/100 mL premix        2 g 200 mL/hr over 30 Minutes Intravenous Every 8 hours 12/28/23 1304 12/28/23 2131   12/28/23 0600  ceFAZolin  (ANCEF ) IVPB 2g/100 mL premix        2 g 200 mL/hr over 30 Minutes Intravenous On call to O.R. 12/28/23 0555 12/28/23 0800       Discharge Exam: Blood pressure (!) 109/59, pulse 72, temperature 98 F (36.7 C), temperature source Oral, resp. rate 16, height 5' 1.5 (1.562 m), weight 48.5 kg, SpO2 100%. Neurologic: Grossly normal Ambulating and voiding well incision cdi   Discharge Medications:   Allergies as of 12/30/2023   No Known Allergies      Medication List     TAKE these medications    amoxicillin -clavulanate 875-125 MG tablet Commonly known as: AUGMENTIN  Take 1 tablet by mouth 2 (two) times daily.   amphetamine -dextroamphetamine  20 MG 24 hr capsule Commonly known as: Adderall  XR Take 1 capsule (20 mg total) by mouth every morning.   amphetamine -dextroamphetamine  20 MG 24 hr capsule Commonly known as: Adderall  XR Take 1 capsule (20 mg total) by mouth every morning.   amphetamine -dextroamphetamine  20 MG 24 hr capsule Commonly known as: Adderall  XR Take 1 capsule (20 mg total) by mouth every morning.   celecoxib  200 MG capsule Commonly known as: CeleBREX  Take 1 capsule (200 mg total) by mouth 2 (two) times daily.   diazepam  5 MG tablet Commonly known as: VALIUM  TAKE ONE TABLET BY MOUTH EVERY 8 HOURS AS NEEDED FOR ANXIETY   doxycycline  50 MG tablet Commonly known as: ADOXA Take 50 mg by mouth daily.   estradiol  0.1 MG/GM vaginal cream Commonly known as: ESTRACE  VAGINAL Apply a fingertip amount to urethra 2 times per week. What changed:  how much to take when to take this   HYDROmorphone  8 MG tablet Commonly known as: DILAUDID  Take 1 tablet (8 mg total) by mouth every 8 (eight) hours as needed for severe pain (pain score 7-10).   nystatin  100000 UNIT/ML suspension Commonly known as:  MYCOSTATIN  Take 5 mLs (500,000 Units total) by mouth 4 (four) times daily.   ondansetron  8 MG tablet Commonly known as: Zofran  Take 1 tablet (8 mg total) by mouth every 8 (eight) hours as needed for nausea or vomiting.   Oxycodone  HCl 20 MG Tabs Take 1 tablet (20 mg total) by mouth every 6 (six) hours as needed (pain).   polyethylene glycol powder 17 GM/SCOOP powder Commonly known as: GLYCOLAX /MIRALAX  TAKE 17 GRAMS BY MOUTH 2 TIMES DAILY AS NEEDED. What changed: See the new instructions.   pregabalin  300 MG  capsule Commonly known as: LYRICA  Take 1 capsule (300 mg total) by mouth 2 (two) times daily.   Restasis  0.05 % ophthalmic emulsion Generic drug: cycloSPORINE  Place 1 drop into both eyes 2 (two) times daily.   Stool Softener/Laxative 50-8.6 MG tablet Generic drug: senna-docusate Take 1 tablet by mouth 2 (two) times daily. What changed:  when to take this reasons to take this   tiZANidine  4 MG tablet Commonly known as: Zanaflex  Take 1 tablet (4 mg total) by mouth 3 (three) times daily.   tobramycin -dexamethasone  ophthalmic solution Commonly known as: TobraDex  Place 2 drops into both eyes every 4 (four) hours while awake.   valACYclovir  1000 MG tablet Commonly known as: Valtrex  Take 1 tablet (1,000 mg total) by mouth 3 (three) times daily. What changed: when to take this   verapamil  120 MG CR tablet Commonly known as: CALAN -SR Take 1 tablet (120 mg total) by mouth at bedtime.               Durable Medical Equipment  (From admission, onward)           Start     Ordered   12/28/23 1305  DME Walker rolling  Once       Question:  Patient needs a walker to treat with the following condition  Answer:  S/P lumbar fusion   12/28/23 1304   12/28/23 1305  DME 3 n 1  Once        12/28/23 1304            Disposition: home   Final Dx: PLIF L5-S1  Discharge Instructions      Remove dressing in 72 hours   Complete by: As directed    Call MD for:    Complete by: As directed    Call MD for:  difficulty breathing, headache or visual disturbances   Complete by: As directed    Call MD for:  hives   Complete by: As directed    Call MD for:  persistant dizziness or light-headedness   Complete by: As directed    Call MD for:  persistant nausea and vomiting   Complete by: As directed    Call MD for:  redness, tenderness, or signs of infection (pain, swelling, redness, odor or green/yellow discharge around incision site)   Complete by: As directed    Call MD for:  severe uncontrolled pain   Complete by: As directed    Call MD for:  temperature >100.4   Complete by: As directed    Diet - low sodium heart healthy   Complete by: As directed    Driving Restrictions   Complete by: As directed    No driving for 2 weeks, no riding in the car for 1 week   Increase activity slowly   Complete by: As directed    Lifting restrictions   Complete by: As directed    No lifting more than 8 lbs        Contact information for after-discharge care     Home Medical Care     [facility]) .   Service: Home Health Services                      Signed: Suzen Lacks [facility] 12/30/2023, 7:38 AM

## 2023-12-30 NOTE — Progress Notes (Signed)
 Physical Therapy Treatment  Patient Details Name: [name] MRN: [id number] DOB: [birth year] Today's Date: 12/30/2023   History of Present Illness Pt is a 68 y/o female s/p L5-S1 PLIF on 12/28/23. PMH significant for ORIF R fibular fx, L TSA, ACDF, ORIF L clavicle fx, ADHD, ARDS, Osteoporosis, R prosthetic eye, HTN, MRSA, chronic narcotic dependence.    PT Comments  Pt progressing well with post-op mobility. She was able to demonstrate transfers and ambulation with supervision to CGA and RW, with light min assist for stair training. Reinforced education on precautions, brace application/wearing schedule, appropriate activity progression, and car transfer. Will continue to follow.      If plan is discharge home, recommend the following: A little help with walking and/or transfers;A little help with bathing/dressing/bathroom;Help with stairs or ramp for entrance;Assist for transportation;Assistance with cooking/housework   Can travel by private vehicle        Equipment Recommendations  None recommended by PT    Recommendations for Other Services       Precautions / Restrictions Precautions Precautions: Fall;Back Precaution Booklet Issued: Yes (comment) Recall of Precautions/Restrictions: Intact Precaution/Restrictions Comments: able to recall 3/3 precautions Required Braces or Orthoses: Spinal Brace Spinal Brace: Lumbar corset;Applied in sitting position Restrictions Weight Bearing Restrictions Per Provider Order: No     Mobility  Bed Mobility               General bed mobility comments: Pt received sitting up in the recliner.    Transfers Overall transfer level: Needs assistance Equipment used: Rolling walker (2 wheels) Transfers: Sit to/from Stand Sit to Stand: Supervision           General transfer comment: Pt demonstrated good hand placement on seated surface for safety.    Ambulation/Gait Ambulation/Gait assistance: Contact guard assist Gait  Distance (Feet): 175 Feet Assistive device: Rolling walker (2 wheels) Gait Pattern/deviations: Shuffle, Narrow base of support, Trunk flexed, Decreased stride length, Step-through pattern Gait velocity: decreased Gait velocity interpretation: <1.8 ft/sec, indicate of risk for recurrent falls   General Gait Details: VC's for wider BOS and improved posture. Pt reports she had back spasms after ambulation in hall yesterday despite conservative distance, however needed to ambulate to the stairs for practice today. Pt reports feeling able to make it to the stairs and with no complaints of increased pain throughout gait training.   Stairs Stairs: Yes Stairs assistance: Contact guard assist, Min assist Stair Management: One rail Left, Step to pattern, Forwards Number of Stairs: 3 General stair comments: VC's for sequencing and general safety. HHA on the R and railing use on the L.   Wheelchair Mobility     Tilt Bed    Modified Rankin (Stroke Patients Only)       Balance Overall balance assessment: Needs assistance Sitting-balance support: No upper extremity supported, Feet supported Sitting balance-Leahy Scale: Fair     Standing balance support: During functional activity, No upper extremity supported Standing balance-Leahy Scale: Fair Standing balance comment: statically                            Communication Communication Communication: No apparent difficulties  Cognition Arousal: Alert Behavior During Therapy: WFL for tasks assessed/performed   PT - Cognitive impairments: No apparent impairments                         Following commands: Intact      Cueing Cueing Techniques:  Verbal cues, Tactile cues  Exercises      General Comments General comments (skin integrity, edema, etc.): practiced car transfer in room      Pertinent Vitals/Pain Pain Assessment Pain Assessment: Faces Faces Pain Scale: Hurts a little bit Pain Location: Surgery  site Pain Descriptors / Indicators: Discomfort, Sore Pain Intervention(s): Limited activity within patient's tolerance, Monitored during session, Repositioned    Home Living Family/patient expects to be discharged to:: Private residence                        Prior Function            PT Goals (current goals can now be found in the care plan section) Acute Rehab PT Goals Patient Stated Goal: to get better PT Goal Formulation: With patient Time For Goal Achievement: 01/11/24 Potential to Achieve Goals: Good Progress towards PT goals: Progressing toward goals    Frequency    Min 3X/week      PT Plan      Co-evaluation              AM-PAC PT 6 Clicks Mobility   Outcome Measure  Help needed turning from your back to your side while in a flat bed without using bedrails?: A Little Help needed moving from lying on your back to sitting on the side of a flat bed without using bedrails?: A Little Help needed moving to and from a bed to a chair (including a wheelchair)?: A Little Help needed standing up from a chair using your arms (e.g., wheelchair or bedside chair)?: A Little Help needed to walk in hospital room?: A Little Help needed climbing 3-5 steps with a railing? : A Little 6 Click Score: 18    End of Session Equipment Utilized During Treatment: Gait belt;Back brace Activity Tolerance: Patient tolerated treatment well Patient left: in bed;with call bell/phone within reach Nurse Communication: Mobility status PT Visit Diagnosis: Other abnormalities of gait and mobility (R26.89)     Time: 9141-9083 PT Time Calculation (min) (ACUTE ONLY): 18 min  Charges:    $Gait Training: 8-22 mins PT General Charges $$ ACUTE PT VISIT: 1 Visit                     Leita Sable, PT, DPT Acute Rehabilitation Services Secure Chat Preferred Office: [phone]    Leita JONETTA Sable 12/30/2023, 9:26 AM

## 2023-12-30 NOTE — Progress Notes (Signed)
 Occupational Therapy Treatment Patient Details Name: [name] MRN: [id number] DOB: 22-Jun-1955 Today's Date: 12/30/2023   History of present illness Pt is a 68 y/o female s/p L5-S1 PLIF on 12/28/23. PMH significant for ORIF R fibular fx, L TSA, ACDF, ORIF L clavicle fx, ADHD, ARDS, Osteoporosis, R prosthetic eye, HTN, MRSA, chronic narcotic dependence.   OT comments  Patient demonstrating good gains with OT treatment. Patient able to recall 3/3 back precautions and demonstrated good technique with bed mobility, grooming/bathing at sink and dressing. Patient able to use figure 4 to reach feet for LB dressing.  Patient is expected to discharge to son's home in [place] today with [facility] to follow.       If plan is discharge home, recommend the following:  A little help with walking and/or transfers;A little help with bathing/dressing/bathroom;Assistance with cooking/housework;Assist for transportation;Help with stairs or ramp for entrance   Equipment Recommendations  None recommended by OT    Recommendations for Other Services      Precautions / Restrictions Precautions Precautions: Fall;Back Precaution Booklet Issued: Yes (comment) Recall of Precautions/Restrictions: Intact Precaution/Restrictions Comments: able to recall 3/3 precautions Required Braces or Orthoses: Spinal Brace Spinal Brace: Lumbar corset;Applied in sitting position Restrictions Weight Bearing Restrictions Per Provider Order: No       Mobility Bed Mobility Overal bed mobility: Needs Assistance Bed Mobility: Rolling, Sidelying to Sit Rolling: Supervision Sidelying to sit: Supervision       General bed mobility comments: able to demonstrate log rolling technique without verbal cues    Transfers Overall transfer level: Needs assistance Equipment used: Rolling walker (2 wheels) Transfers: Sit to/from Stand Sit to Stand: Supervision           General transfer comment: cues for hand placement  and supervision for safety     Balance Overall balance assessment: Needs assistance Sitting-balance support: No upper extremity supported, Feet supported Sitting balance-Leahy Scale: Fair Sitting balance - Comments: EOB   Standing balance support: During functional activity, No upper extremity supported Standing balance-Leahy Scale: Fair Standing balance comment: able to stand at sink for self care tasks without UE support                           ADL either performed or assessed with clinical judgement   ADL Overall ADL's : Needs assistance/impaired     Grooming: Wash/dry hands;Wash/dry face;Oral care;Supervision/safety;Standing Grooming Details (indicate cue type and reason): at sink Upper Body Bathing: Supervision/ safety;Standing Upper Body Bathing Details (indicate cue type and reason): at sink Lower Body Bathing: Supervison/ safety;Sit to/from stand   Upper Body Dressing : Set up;Sitting   Lower Body Dressing: Set up;Sitting/lateral leans Lower Body Dressing Details (indicate cue type and reason): used figure four to reach feet and maintain back precautions Toilet Transfer: Supervision/safety;Ambulation;Rolling walker (2 wheels);Grab bars Toilet Transfer Details (indicate cue type and reason): cues for safety Toileting- Clothing Manipulation and Hygiene: Modified independent;Sitting/lateral lean         General ADL Comments: min assist with corset    Extremity/Trunk Assessment              Vision       Perception     Praxis     Communication Communication Communication: No apparent difficulties   Cognition Arousal: Alert Behavior During Therapy: WFL for tasks assessed/performed Cognition: No apparent impairments             OT - Cognition Comments: able to recall  back precautions                 Following commands: Intact        Cueing   Cueing Techniques: Verbal cues, Tactile cues  Exercises      Shoulder  Instructions       General Comments able to recall back precautions and perform technique with grooming and bed mobility    Pertinent Vitals/ Pain       Pain Assessment Pain Assessment: Faces Faces Pain Scale: Hurts a little bit Pain Location: Surgery site Pain Descriptors / Indicators: Discomfort, Sore Pain Intervention(s): Monitored during session, Premedicated before session, Repositioned  Home Living                                          Prior Functioning/Environment              Frequency  Min 2X/week        Progress Toward Goals  OT Goals(current goals can now be found in the care plan section)  Progress towards OT goals: Progressing toward goals  Acute Rehab OT Goals Patient Stated Goal: to go to son's home OT Goal Formulation: With patient Time For Goal Achievement: 01/12/24 Potential to Achieve Goals: Good ADL Goals Pt Will Perform Lower Body Dressing: with modified independence;sit to/from stand Pt Will Transfer to Toilet: with modified independence;ambulating Additional ADL Goal #1: pr will perform bed mobility within spinal precautions mod I  Plan      Co-evaluation                 AM-PAC OT 6 Clicks Daily Activity     Outcome Measure   Help from another person eating meals?: None Help from another person taking care of personal grooming?: A Little Help from another person toileting, which includes using toliet, bedpan, or urinal?: A Little Help from another person bathing (including washing, rinsing, drying)?: A Little Help from another person to put on and taking off regular upper body clothing?: A Little Help from another person to put on and taking off regular lower body clothing?: A Little 6 Click Score: 19    End of Session Equipment Utilized During Treatment: Gait belt;Rolling walker (2 wheels);Back brace  OT Visit Diagnosis: Unsteadiness on feet (R26.81);Muscle weakness (generalized) (M62.81);Pain Pain -  part of body:  (back)   Activity Tolerance Patient tolerated treatment well   Patient Left in chair;with call bell/phone within reach   Nurse Communication Mobility status        Time: 0730-0801 OT Time Calculation (min): 31 min  Charges: OT General Charges $OT Visit: 1 Visit OT Treatments $Self Care/Home Management : 23-37 mins  Dick Laine, OTA Acute Rehabilitation Services  Office [phone]   Jeb LITTIE Laine 12/30/2023, 8:08 AM

## 2024-01-03 ENCOUNTER — Other Ambulatory Visit: Payer: Self-pay

## 2024-01-03 ENCOUNTER — Other Ambulatory Visit: Payer: Self-pay | Admitting: Family Medicine

## 2024-01-03 ENCOUNTER — Other Ambulatory Visit (HOSPITAL_COMMUNITY): Payer: Self-pay

## 2024-01-06 NOTE — Telephone Encounter (Signed)
 Done

## 2024-01-26 ENCOUNTER — Telehealth: Admitting: Family Medicine

## 2024-01-26 DIAGNOSIS — M150 Primary generalized (osteo)arthritis: Secondary | ICD-10-CM

## 2024-01-26 DIAGNOSIS — G8929 Other chronic pain: Secondary | ICD-10-CM

## 2024-01-26 DIAGNOSIS — M5441 Lumbago with sciatica, right side: Secondary | ICD-10-CM | POA: Diagnosis not present

## 2024-01-26 MED ORDER — HYDROMORPHONE HCL 8 MG PO TABS: 8.0000 mg | ORAL_TABLET | Freq: Three times a day (TID) | ORAL | 0 refills | Status: DC | PRN

## 2024-01-26 MED ORDER — OXYCODONE HCL 20 MG PO TABS: 20.0000 mg | ORAL_TABLET | Freq: Four times a day (QID) | ORAL | 0 refills | Status: DC | PRN

## 2024-01-26 NOTE — Progress Notes (Signed)
 "  Subjective:    Patient ID: [name], female    DOB: Apr 28, 1955, 68 y.o.   MRN: [id number]  HPI Virtual Visit via Video Note  I connected with the patient on 01/26/2024 at  9:30 AM EST by a video enabled telemedicine application and verified that I am speaking with the correct person using two identifiers.  Location patient: home Location provider:work or home office Persons participating in the virtual visit: patient, provider  I discussed the limitations of evaluation and management by telemedicine and the availability of in person appointments. The patient expressed understanding and agreed to proceed.   HPI: Here for pain management. She had an L5-S1 fusion surgery per Dr. Alm Molt on 12-28-23, and this went well. She is currently living near her son in [place] while she is recovering. She is getting PT 3 days a week. Dr. Molt started her on Tizanidine , but he left care of her pain medications to us . She asks for one more refill of both Oxycodone  and Hydromorphone  while she is staying in [place]. She plans to return to [place] in late January.    ROS: See pertinent positives and negatives per HPI.  Past Medical History:  Diagnosis Date   ADHD (attention deficit hyperactivity disorder)    Anemia    Anxiety    ARDS (adult respiratory distress syndrome) (HCC) 2007   Arthritis    Depression    Gallstones 01/07/2018   Ganglion cyst of dorsum of right wrist    Hepatitis    2004 non detectable now   MRSA (methicillin resistant Staphylococcus aureus) 2007   Osteoporosis    Pneumonia 2009   Prosthetic eye globe    right eye    Raynaud's disease     Past Surgical History:  Procedure Laterality Date   ABDOMINAL AORTOGRAM W/LOWER EXTREMITY N/A 11/19/2022   Procedure: ABDOMINAL AORTOGRAM W/LOWER EXTREMITY;  Surgeon: Gretta Lonni PARAS, MD;  [facility]: [facility];  Service: Cardiovascular;  Laterality: N/A;   ABDOMINAL HYSTERECTOMY      ANTERIOR CERVICAL DECOMP/DISCECTOMY FUSION N/A 12/12/2014   Procedure: ANTERIOR CERVICAL DECOMPRESSION/DISCECTOMY FUSION 3 LEVELS;  Surgeon: Oneil Priestly, MD;  Location: [facility] OR;  Service: Orthopedics;  Laterality: N/A;  Anterior cervical decompression fusion, cerivcal 5-6, cervical 6-7, cervical 7-thoracic 1 with instrumentation and allografrt   APLIGRAFT PLACEMENT Right 04/16/2022   Procedure: APPLICATION OF SKIN SUBSTITUTE GRAFT;  Surgeon: Elsa Lonni SAUNDERS, MD;  [facility] OR;  Service: Orthopedics;  Laterality: Right;   APPENDECTOMY  1974   APPLICATION OF WOUND VAC Left 07/22/2022   Procedure: APPLICATION OF WOUND VAC AND MYRIAD;  Surgeon: Lowery Estefana RAMAN, DO;  Location: [facility] OR;  Service: Plastics;  Laterality: Left;   CATARACT EXTRACTION Left 11/2018   CHOLECYSTECTOMY N/A 01/07/2018   Procedure: LAPAROSCOPIC CHOLECYSTECTOMY WITH INTRAOPERATIVE CHOLANGIOGRAM;  Surgeon: Gail Favorite, MD;  Location: [facility] OR;  Service: General;  Laterality: N/A;   COLONOSCOPY  04/14/2013   per Dr. Teressa, clear, repeat in 10 yrs    CORNEAL TRANSPLANT Right 2007   x4   ENUCLEATION Right 2013   eye infection   GANGLION CYST EXCISION Right 05/25/2016   Procedure: RIGHT WRIST ULNAR GANGLION EXCISION;  Surgeon: Alm Hummer, MD;  Location: [facility];  Service: Orthopedics;  Laterality: Right;   HARDWARE REMOVAL Right 08/20/2022   Procedure: HARDWARE REMOVAL;  Surgeon: Elsa Lonni SAUNDERS, MD;  Location: [facility] OR;  Service: Orthopedics;  Laterality: Right;   HEMATOMA EVACUATION Left 07/22/2022  Procedure: EVACUATION HEMATOMA LEFT BACK WITH EXCISION OF WOUND;  Surgeon: Lowery Estefana RAMAN, DO;  Location: [facility];  Service: Plastics;  Laterality: Left;   I & D EXTREMITY Right 05/28/2022   Procedure: right ankle wound debridement with myriad placement;  Surgeon: Lowery Estefana RAMAN, DO;  Location: [facility];  Service: Plastics;  Laterality: Right;   I & D EXTREMITY Right 08/20/2022    Procedure: IRRIGATION AND DEBRIDEMENT EXTREMITY WITH WOUND VAC PLACEMENT;  Surgeon: Elsa Lonni SAUNDERS, MD;  Location: [facility] OR;  Service: Orthopedics;  Laterality: Right;   I & D EXTREMITY Right 11/26/2022   Procedure: EXCISIONAL DEBRIDEMENT OF NONHEALING RIGHT LEG WOUND, REPAIR OF WOUND DEHISCENCE, INCISION AND DRAINAGE OF DISTAL LEG ABSCESS, RIGHT FIBULAR SAUCERIZATION, APPLICATION OF SKIN GRAFT SUBSTITUTE, WOUND VAC PLACEMENT;  Surgeon: Elsa Lonni SAUNDERS, MD;  Location: [facility];  Service: Orthopedics;  Laterality: Right;   INCISION AND DRAINAGE Right 07/22/2022   Procedure: INCISION AND DRAINAGE RIGHT ANKLE;  Surgeon: Lowery Estefana RAMAN, DO;  Location: [facility] OR;  Service: Plastics;  Laterality: Right;   IR CHEST FLUORO  12/04/2022   IR PATIENT EVAL TECH 0-60 MINS  12/04/2022   MINOR HARDWARE REMOVAL Right 04/16/2022   Procedure: REPAIR OF SURGICAL WOUND DEHISCENCE RIGHT ANKLE, REMOVAL OF DEEP ORTHOPEDIC HARDWARE;  Surgeon: Elsa Lonni SAUNDERS, MD;  Location: [facility];  Service: Orthopedics;  Laterality: Right;   ORIF CLAVICULAR FRACTURE Left 02/03/2022   Procedure: OPEN REDUCTION INTERNAL FIXATION (ORIF) LEFT CLAVICULAR FRACTURE WITH CORACOLAVICULAR FIXATION;  Surgeon: Dozier Soulier, MD;  Location: [facility] OR;  Service: Orthopedics;  Laterality: Left;  BIOMET ZIP LOOP WITH CLAVICLE PLATES   ORIF FIBULA FRACTURE Right 02/03/2022   Procedure: OPEN TREATMENT OF RIGHT PILON ANKLE FRACTURE INCLUDING LATERAL MALLEOLUS FRACTURE;  Surgeon: Elsa Lonni SAUNDERS, MD;  Location: [facility] OR;  Service: Orthopedics;  Laterality: Right;  LENGTH OF SURGERY: 120 MINUTES   REVERSE SHOULDER ARTHROPLASTY Right 07/10/2021   Procedure: REVERSE SHOULDER ARTHROPLASTY;  Surgeon: Dozier Soulier, MD;  Location: [facility];  Service: Orthopedics;  Laterality: Right;   RHINOPLASTY  1980   ROBOTIC ASSISTED LAPAROSCOPIC SACROCOLPOPEXY Bilateral 06/13/2020   Procedure: XI ROBOTIC ASSISTED LAPAROSCOPIC SACROCOLPOPEXY AND SUPRACERVICAL  HYSTERECTOMY AND BILATERAL  SALPINGO OOPHERECTOMY;  Surgeon: Cam Morene ORN, MD;  Location: [facility];  Service: Urology;  Laterality: Bilateral;   SYNDESMOSIS REPAIR Right 02/03/2022   Procedure: POSSIBLE OPEN TREATMENT RIGHT SYNDESMOSIS;  Surgeon: Elsa Lonni SAUNDERS, MD;  Location: [facility] OR;  Service: Orthopedics;  Laterality: Right;   TONSILLECTOMY     removed in 3rd grade   TOTAL SHOULDER ARTHROPLASTY Left 03/13/2019   Procedure: TOTAL SHOULDER ARTHROPLASTY;  Surgeon: Dozier Soulier, MD;  Location: [facility];  Service: Orthopedics;  Laterality: Left;    Family History  Problem Relation Age of Onset   Breast cancer Maternal Aunt 60   Colon cancer Neg Hx     Current Medications[1]  EXAM:  VITALS per patient if applicable:  GENERAL: alert, oriented, appears well and in no acute distress  HEENT: atraumatic, conjunttiva clear, no obvious abnormalities on inspection of external nose and ears  NECK: normal movements of the head and neck  LUNGS: on inspection no signs of respiratory distress, breathing rate appears normal, no obvious gross SOB, gasping or wheezing  CV: no obvious cyanosis  MS: moves all visible extremities without noticeable abnormality  PSYCH/NEURO: pleasant and cooperative, no obvious depression or anxiety, speech and thought processing grossly intact  ASSESSMENT AND PLAN: Pain management. Indication  for chronic opioid: OA and low back pain Medication and dose: Oxycodone  20 mg and Hydromorphone  8 mg # pills per month: 120 and 90 Last UDS date: 03-24-23 Opioid Treatment Agreement signed (Y/N): 04-30-17 Opioid Treatment Agreement last reviewed with patient:  01-26-24 [facility] reviewed this encounter (include red flags): Yes Meds were refilled.  Garnette Olmsted, MD  Discussed the following assessment and plan:  No diagnosis found.     I discussed the assessment and treatment plan with the patient. The patient was provided an opportunity to ask  questions and all were answered. The patient agreed with the plan and demonstrated an understanding of the instructions.   The patient was advised to call back or seek an in-person evaluation if the symptoms worsen or if the condition fails to improve as anticipated.      Review of Systems     Objective:   Physical Exam        Assessment & Plan:       [1]  Current Outpatient Medications:    amoxicillin -clavulanate (AUGMENTIN ) 875-125 MG tablet, Take 1 tablet by mouth 2 (two) times daily., Disp: 20 tablet, Rfl: 0   amphetamine -dextroamphetamine  (ADDERALL  XR) 20 MG 24 hr capsule, TAKE ONE CAPSULE BY MOUTH EVERY MORNING, Disp: 30 capsule, Rfl: 0   amphetamine -dextroamphetamine  (ADDERALL  XR) 20 MG 24 hr capsule, Take 1 capsule (20 mg total) by mouth every morning., Disp: 30 capsule, Rfl: 0   amphetamine -dextroamphetamine  (ADDERALL  XR) 20 MG 24 hr capsule, Take 1 capsule (20 mg total) by mouth every morning., Disp: 30 capsule, Rfl: 0   celecoxib  (CELEBREX ) 200 MG capsule, Take 1 capsule (200 mg total) by mouth 2 (two) times daily., Disp: 180 capsule, Rfl: 3   cycloSPORINE  (RESTASIS ) 0.05 % ophthalmic emulsion, Place 1 drop into both eyes 2 (two) times daily., Disp: 60 each, Rfl: 11   diazepam  (VALIUM ) 5 MG tablet, TAKE ONE TABLET BY MOUTH EVERY 8 HOURS AS NEEDED FOR ANXIETY, Disp: 90 tablet, Rfl: 5   doxycycline  (ADOXA) 50 MG tablet, Take 50 mg by mouth daily., Disp: , Rfl:    estradiol  (ESTRACE  VAGINAL) 0.1 MG/GM vaginal cream, Apply a fingertip amount to urethra 2 times per week. (Patient taking differently: Place 1 Applicatorful vaginally once a week.), Disp: 42.5 g, Rfl: 3   HYDROmorphone  (DILAUDID ) 8 MG tablet, Take 1 tablet (8 mg total) by mouth every 8 (eight) hours as needed for severe pain (pain score 7-10)., Disp: 90 tablet, Rfl: 0   nystatin  (MYCOSTATIN ) 100000 UNIT/ML suspension, Take 5 mLs (500,000 Units total) by mouth 4 (four) times daily., Disp: 473 mL, Rfl: 2    ondansetron  (ZOFRAN ) 8 MG tablet, Take 1 tablet (8 mg total) by mouth every 8 (eight) hours as needed for nausea or vomiting., Disp: 20 tablet, Rfl: 3   Oxycodone  HCl 20 MG TABS, Take 1 tablet (20 mg total) by mouth every 6 (six) hours as needed (pain)., Disp: 120 tablet, Rfl: 0   polyethylene glycol powder (GLYCOLAX /MIRALAX ) powder, TAKE 17 GRAMS BY MOUTH 2 TIMES DAILY AS NEEDED. (Patient taking differently: Take 17 g by mouth daily as needed for moderate constipation.), Disp: 3162 g, Rfl: 1   pregabalin  (LYRICA ) 300 MG capsule, Take 1 capsule (300 mg total) by mouth 2 (two) times daily., Disp: 60 capsule, Rfl: 5   senna-docusate (SENOKOT-S) 8.6-50 MG tablet, Take 1 tablet by mouth 2 (two) times daily. (Patient taking differently: Take 1 tablet by mouth daily as needed for moderate constipation.), Disp: 60 tablet, Rfl:  0   tiZANidine  (ZANAFLEX ) 4 MG tablet, Take 1 tablet (4 mg total) by mouth 3 (three) times daily., Disp: 90 tablet, Rfl: 1   tobramycin -dexamethasone  (TOBRADEX ) ophthalmic solution, Place 2 drops into both eyes every 4 (four) hours while awake., Disp: 5 mL, Rfl: 0   valACYclovir  (VALTREX ) 1000 MG tablet, Take 1 tablet (1,000 mg total) by mouth 3 (three) times daily. (Patient taking differently: Take 1,000 mg by mouth daily.), Disp: 30 tablet, Rfl: 1   verapamil  (CALAN -SR) 120 MG CR tablet, Take 1 tablet (120 mg total) by mouth at bedtime., Disp: 90 tablet, Rfl: 3  "

## 2024-02-02 ENCOUNTER — Other Ambulatory Visit (HOSPITAL_COMMUNITY): Payer: Self-pay

## 2024-02-02 ENCOUNTER — Other Ambulatory Visit: Payer: Self-pay

## 2024-02-03 ENCOUNTER — Other Ambulatory Visit (HOSPITAL_COMMUNITY): Payer: Self-pay

## 2024-02-04 ENCOUNTER — Encounter: Payer: Self-pay | Admitting: Family Medicine

## 2024-02-04 NOTE — Telephone Encounter (Signed)
 Spoke with pt advised of Dr Alyne Babinski recommendation, pt voiced understanding

## 2024-02-04 NOTE — Telephone Encounter (Signed)
 This makes me worry she may have a blood clot in the leg. She should go to the ED so they can order an US  study to look for clots. Do not go to Orthopedics for this

## 2024-03-03 ENCOUNTER — Other Ambulatory Visit: Payer: Self-pay | Admitting: *Deleted

## 2024-03-03 MED ORDER — HYDROMORPHONE HCL 8 MG PO TABS: 8.0000 mg | ORAL_TABLET | Freq: Three times a day (TID) | ORAL | 0 refills | Status: AC | PRN

## 2024-03-03 MED ORDER — OXYCODONE HCL 20 MG PO TABS: 20.0000 mg | ORAL_TABLET | Freq: Four times a day (QID) | ORAL | 0 refills | Status: AC | PRN

## 2024-03-03 NOTE — Telephone Encounter (Unsigned)
 Copied from CRM [id number]. Topic: Clinical - Medication Refill >> Mar 03, 2024 12:03 PM Burnard DEL wrote: Medication: Oxycodone  HCl 20 MG TABS  HYDROmorphone  (DILAUDID ) 8 MG tablet  Has the patient contacted their pharmacy? Yes (Agent: If no, request that the patient contact the pharmacy for the refill. If patient does not wish to contact the pharmacy document the reason why and proceed with request.) (Agent: If yes, when and what did the pharmacy advise?)  This is the patient's preferred pharmacy:    [facility] [address] [place], [place] - [address] [facility] [address] [place] [address] Phone: [phone] Fax: [phone]  Is this the correct pharmacy for this prescription? Yes If no, delete pharmacy and type the correct one.   Has the prescription been filled recently? No  Is the patient out of the medication? No  Has the patient been seen for an appointment in the last year OR does the patient have an upcoming appointment? Yes  Can we respond through MyChart? Yes  Agent: Please be advised that Rx refills may take up to 3 business days. We ask that you follow-up with your pharmacy.

## 2024-03-03 NOTE — Telephone Encounter (Signed)
Done for 2 months
